# Patient Record
Sex: Female | Born: 1955
Health system: Southern US, Community
[De-identification: ages and names within clinical notes are randomized; demographics above are authoritative.]

## PROBLEM LIST (undated history)

## (undated) DIAGNOSIS — F329 Major depressive disorder, single episode, unspecified: Secondary | ICD-10-CM

## (undated) DIAGNOSIS — E785 Hyperlipidemia, unspecified: Secondary | ICD-10-CM

## (undated) DIAGNOSIS — D219 Benign neoplasm of connective and other soft tissue, unspecified: Secondary | ICD-10-CM

## (undated) DIAGNOSIS — I2510 Atherosclerotic heart disease of native coronary artery without angina pectoris: Secondary | ICD-10-CM

## (undated) DIAGNOSIS — K219 Gastro-esophageal reflux disease without esophagitis: Secondary | ICD-10-CM

## (undated) DIAGNOSIS — I509 Heart failure, unspecified: Secondary | ICD-10-CM

## (undated) DIAGNOSIS — M109 Gout, unspecified: Secondary | ICD-10-CM

## (undated) DIAGNOSIS — I10 Essential (primary) hypertension: Secondary | ICD-10-CM

## (undated) DIAGNOSIS — M79674 Pain in right toe(s): Secondary | ICD-10-CM

## (undated) DIAGNOSIS — R918 Other nonspecific abnormal finding of lung field: Secondary | ICD-10-CM

## (undated) DIAGNOSIS — F32A Depression, unspecified: Secondary | ICD-10-CM

## (undated) DIAGNOSIS — J449 Chronic obstructive pulmonary disease, unspecified: Secondary | ICD-10-CM

## (undated) DIAGNOSIS — M329 Systemic lupus erythematosus, unspecified: Secondary | ICD-10-CM

## (undated) DIAGNOSIS — Z9981 Dependence on supplemental oxygen: Secondary | ICD-10-CM

## (undated) DIAGNOSIS — I639 Cerebral infarction, unspecified: Secondary | ICD-10-CM

## (undated) HISTORY — DX: Major depressive disorder, single episode, unspecified: F32.9

## (undated) HISTORY — DX: Depression, unspecified: F32.A

## (undated) HISTORY — DX: Chronic obstructive pulmonary disease, unspecified: J44.9

## (undated) HISTORY — DX: Systemic lupus erythematosus, unspecified: M32.9

## (undated) HISTORY — PX: FRACTURE SURGERY: SHX138

## (undated) HISTORY — DX: Pain in right toe(s): M79.674

## (undated) HISTORY — DX: Gastro-esophageal reflux disease without esophagitis: K21.9

## (undated) HISTORY — DX: Benign neoplasm of connective and other soft tissue, unspecified: D21.9

## (undated) HISTORY — DX: Other nonspecific abnormal finding of lung field: R91.8

## (undated) HISTORY — PX: CORONARY ANGIOPLASTY WITH STENT PLACEMENT: SHX49

## (undated) HISTORY — DX: Hyperlipidemia, unspecified: E78.5

## (undated) HISTORY — DX: Atherosclerotic heart disease of native coronary artery without angina pectoris: I25.10

## (undated) HISTORY — PX: TUBAL LIGATION: SHX77

## (undated) HISTORY — DX: Essential (primary) hypertension: I10

---

## 2000-04-13 ENCOUNTER — Emergency Department (HOSPITAL_COMMUNITY): Admission: EM | Admit: 2000-04-13 | Discharge: 2000-04-13 | Payer: Self-pay | Admitting: Emergency Medicine

## 2002-09-04 ENCOUNTER — Encounter (HOSPITAL_COMMUNITY): Admission: RE | Admit: 2002-09-04 | Discharge: 2002-09-04 | Payer: Self-pay | Admitting: Family Medicine

## 2002-10-24 ENCOUNTER — Ambulatory Visit (HOSPITAL_COMMUNITY): Admission: RE | Admit: 2002-10-24 | Discharge: 2002-10-24 | Payer: Self-pay | Admitting: Family Medicine

## 2003-05-17 HISTORY — PX: VAGINAL HYSTERECTOMY: SUR661

## 2003-06-05 ENCOUNTER — Encounter (HOSPITAL_COMMUNITY): Admission: RE | Admit: 2003-06-05 | Discharge: 2003-09-03 | Payer: Self-pay | Admitting: Family Medicine

## 2003-06-14 ENCOUNTER — Encounter (INDEPENDENT_AMBULATORY_CARE_PROVIDER_SITE_OTHER): Payer: Self-pay

## 2003-06-14 ENCOUNTER — Inpatient Hospital Stay (HOSPITAL_COMMUNITY): Admission: RE | Admit: 2003-06-14 | Discharge: 2003-06-17 | Payer: Self-pay | Admitting: Obstetrics and Gynecology

## 2004-11-02 ENCOUNTER — Inpatient Hospital Stay (HOSPITAL_COMMUNITY): Admission: EM | Admit: 2004-11-02 | Discharge: 2004-11-06 | Payer: Self-pay | Admitting: Emergency Medicine

## 2004-11-02 ENCOUNTER — Ambulatory Visit: Payer: Self-pay | Admitting: Internal Medicine

## 2004-11-03 ENCOUNTER — Encounter (INDEPENDENT_AMBULATORY_CARE_PROVIDER_SITE_OTHER): Payer: Self-pay | Admitting: Cardiology

## 2004-11-21 ENCOUNTER — Ambulatory Visit: Payer: Self-pay | Admitting: Internal Medicine

## 2005-10-15 ENCOUNTER — Ambulatory Visit: Payer: Self-pay | Admitting: Internal Medicine

## 2005-10-29 ENCOUNTER — Ambulatory Visit: Payer: Self-pay | Admitting: Internal Medicine

## 2005-12-30 ENCOUNTER — Ambulatory Visit: Payer: Self-pay | Admitting: Internal Medicine

## 2006-01-21 ENCOUNTER — Emergency Department (HOSPITAL_COMMUNITY): Admission: EM | Admit: 2006-01-21 | Discharge: 2006-01-21 | Payer: Self-pay | Admitting: Emergency Medicine

## 2006-08-31 ENCOUNTER — Ambulatory Visit: Payer: Self-pay | Admitting: Internal Medicine

## 2006-12-24 DIAGNOSIS — F1910 Other psychoactive substance abuse, uncomplicated: Secondary | ICD-10-CM | POA: Insufficient documentation

## 2006-12-24 DIAGNOSIS — M797 Fibromyalgia: Secondary | ICD-10-CM

## 2006-12-24 DIAGNOSIS — K219 Gastro-esophageal reflux disease without esophagitis: Secondary | ICD-10-CM

## 2006-12-24 DIAGNOSIS — E785 Hyperlipidemia, unspecified: Secondary | ICD-10-CM

## 2006-12-24 DIAGNOSIS — I2510 Atherosclerotic heart disease of native coronary artery without angina pectoris: Secondary | ICD-10-CM

## 2006-12-24 DIAGNOSIS — Z9861 Coronary angioplasty status: Secondary | ICD-10-CM

## 2006-12-24 DIAGNOSIS — I10 Essential (primary) hypertension: Secondary | ICD-10-CM

## 2006-12-24 DIAGNOSIS — R768 Other specified abnormal immunological findings in serum: Secondary | ICD-10-CM

## 2006-12-27 ENCOUNTER — Ambulatory Visit: Payer: Self-pay | Admitting: Internal Medicine

## 2006-12-27 DIAGNOSIS — F329 Major depressive disorder, single episode, unspecified: Secondary | ICD-10-CM | POA: Insufficient documentation

## 2006-12-27 DIAGNOSIS — F419 Anxiety disorder, unspecified: Secondary | ICD-10-CM

## 2007-03-10 ENCOUNTER — Emergency Department (HOSPITAL_COMMUNITY): Admission: EM | Admit: 2007-03-10 | Discharge: 2007-03-10 | Payer: Self-pay | Admitting: Emergency Medicine

## 2007-08-04 ENCOUNTER — Emergency Department (HOSPITAL_COMMUNITY): Admission: EM | Admit: 2007-08-04 | Discharge: 2007-08-04 | Payer: Self-pay | Admitting: Emergency Medicine

## 2007-11-17 DIAGNOSIS — M329 Systemic lupus erythematosus, unspecified: Secondary | ICD-10-CM

## 2007-11-17 DIAGNOSIS — IMO0002 Reserved for concepts with insufficient information to code with codable children: Secondary | ICD-10-CM

## 2007-11-17 HISTORY — DX: Systemic lupus erythematosus, unspecified: M32.9

## 2007-11-17 HISTORY — DX: Reserved for concepts with insufficient information to code with codable children: IMO0002

## 2007-12-26 ENCOUNTER — Ambulatory Visit (HOSPITAL_COMMUNITY): Admission: RE | Admit: 2007-12-26 | Discharge: 2007-12-26 | Payer: Self-pay | Admitting: Internal Medicine

## 2007-12-26 ENCOUNTER — Ambulatory Visit: Payer: Self-pay | Admitting: Internal Medicine

## 2007-12-26 DIAGNOSIS — R799 Abnormal finding of blood chemistry, unspecified: Secondary | ICD-10-CM | POA: Insufficient documentation

## 2007-12-26 DIAGNOSIS — M79609 Pain in unspecified limb: Secondary | ICD-10-CM | POA: Insufficient documentation

## 2007-12-26 LAB — CONVERTED CEMR LAB
ALT: 28 units/L (ref 0–35)
AST: 26 units/L (ref 0–37)
Albumin: 4.6 g/dL (ref 3.5–5.2)
Alkaline Phosphatase: 111 units/L (ref 39–117)
Anti Nuclear Antibody(ANA): NEGATIVE
BUN: 11 mg/dL (ref 6–23)
Basophils Absolute: 0 10*3/uL (ref 0.0–0.1)
Basophils Relative: 0 % (ref 0–1)
CO2: 26 meq/L (ref 19–32)
Calcium: 9.9 mg/dL (ref 8.4–10.5)
Chloride: 102 meq/L (ref 96–112)
Cholesterol: 225 mg/dL — ABNORMAL HIGH (ref 0–200)
Creatinine, Ser: 0.86 mg/dL (ref 0.40–1.20)
Eosinophils Absolute: 0.1 10*3/uL (ref 0.0–0.7)
Eosinophils Relative: 1 % (ref 0–5)
Glucose, Bld: 91 mg/dL (ref 70–99)
HCT: 44.4 % (ref 36.0–46.0)
HDL: 84 mg/dL (ref >39)
Hemoglobin: 13.9 g/dL (ref 12.0–15.0)
LDL Cholesterol: 128 mg/dL — ABNORMAL HIGH (ref 0–99)
Lymphocytes Relative: 27 % (ref 12–46)
Lymphs Abs: 1.9 10*3/uL (ref 0.7–4.0)
MCHC: 31.3 g/dL (ref 30.0–36.0)
MCV: 89.3 fL (ref 78.0–100.0)
Monocytes Absolute: 0.5 10*3/uL (ref 0.1–1.0)
Monocytes Relative: 7 % (ref 3–12)
Neutro Abs: 4.4 10*3/uL (ref 1.7–7.7)
Neutrophils Relative %: 65 % (ref 43–77)
Platelets: 224 10*3/uL (ref 150–400)
Potassium: 3.8 meq/L (ref 3.5–5.3)
RBC: 4.97 M/uL (ref 3.87–5.11)
RDW: 15.6 % — ABNORMAL HIGH (ref 11.5–15.5)
Rheumatoid fact SerPl-aCnc: <20 intl units/mL (ref 0–20)
Sed Rate: 25 mm/hr — ABNORMAL HIGH (ref 0–22)
Sodium: 140 meq/L (ref 135–145)
Total Bilirubin: 0.6 mg/dL (ref 0.3–1.2)
Total CHOL/HDL Ratio: 2.7
Total Protein: 8 g/dL (ref 6.0–8.3)
Triglycerides: 63 mg/dL (ref <150)
Uric Acid, Serum: 5.4 mg/dL (ref 2.4–7.0)
VLDL: 13 mg/dL (ref 0–40)
WBC: 6.8 10*3/uL (ref 4.0–10.5)

## 2008-01-25 ENCOUNTER — Encounter: Payer: Self-pay | Admitting: Licensed Clinical Social Worker

## 2008-01-25 ENCOUNTER — Ambulatory Visit: Payer: Self-pay | Admitting: Internal Medicine

## 2008-05-14 ENCOUNTER — Encounter (INDEPENDENT_AMBULATORY_CARE_PROVIDER_SITE_OTHER): Payer: Self-pay | Admitting: *Deleted

## 2008-05-14 ENCOUNTER — Ambulatory Visit: Payer: Self-pay | Admitting: Internal Medicine

## 2008-05-14 DIAGNOSIS — M67919 Unspecified disorder of synovium and tendon, unspecified shoulder: Secondary | ICD-10-CM | POA: Insufficient documentation

## 2008-05-14 DIAGNOSIS — M719 Bursopathy, unspecified: Secondary | ICD-10-CM

## 2008-05-14 LAB — CONVERTED CEMR LAB
BUN: 16 mg/dL (ref 6–23)
CO2: 23 meq/L (ref 19–32)
Calcium: 9.6 mg/dL (ref 8.4–10.5)
Chloride: 104 meq/L (ref 96–112)
Creatinine, Ser: 0.81 mg/dL (ref 0.40–1.20)
Glucose, Bld: 93 mg/dL (ref 70–99)
Potassium: 4 meq/L (ref 3.5–5.3)
Sodium: 139 meq/L (ref 135–145)

## 2008-05-16 DIAGNOSIS — R918 Other nonspecific abnormal finding of lung field: Secondary | ICD-10-CM

## 2008-05-16 HISTORY — DX: Other nonspecific abnormal finding of lung field: R91.8

## 2008-06-14 ENCOUNTER — Observation Stay (HOSPITAL_COMMUNITY): Admission: EM | Admit: 2008-06-14 | Discharge: 2008-06-14 | Payer: Self-pay | Admitting: Emergency Medicine

## 2008-06-14 DIAGNOSIS — J984 Other disorders of lung: Secondary | ICD-10-CM

## 2008-10-22 ENCOUNTER — Encounter: Payer: Self-pay | Admitting: Internal Medicine

## 2008-10-22 ENCOUNTER — Encounter (INDEPENDENT_AMBULATORY_CARE_PROVIDER_SITE_OTHER): Payer: Self-pay | Admitting: *Deleted

## 2008-10-22 ENCOUNTER — Ambulatory Visit: Payer: Self-pay | Admitting: Internal Medicine

## 2008-10-22 DIAGNOSIS — G4700 Insomnia, unspecified: Secondary | ICD-10-CM | POA: Insufficient documentation

## 2008-10-22 LAB — CONVERTED CEMR LAB
ALT: 13 units/L (ref 0–35)
ANA Titer 1: 1:40 {titer} — ABNORMAL HIGH
AST: 15 units/L (ref 0–37)
Albumin: 4.3 g/dL (ref 3.5–5.2)
Alkaline Phosphatase: 115 units/L (ref 39–117)
Anti Nuclear Antibody(ANA): POSITIVE — AB
BUN: 14 mg/dL (ref 6–23)
CO2: 23 meq/L (ref 19–32)
Calcium: 9.3 mg/dL (ref 8.4–10.5)
Chloride: 106 meq/L (ref 96–112)
Creatinine, Ser: 0.89 mg/dL (ref 0.40–1.20)
Glucose, Bld: 94 mg/dL (ref 70–99)
Potassium: 4.2 meq/L (ref 3.5–5.3)
Sodium: 142 meq/L (ref 135–145)
Total Bilirubin: 0.4 mg/dL (ref 0.3–1.2)
Total Protein: 7.5 g/dL (ref 6.0–8.3)

## 2008-10-26 ENCOUNTER — Ambulatory Visit (HOSPITAL_COMMUNITY): Admission: RE | Admit: 2008-10-26 | Discharge: 2008-10-26 | Payer: Self-pay | Admitting: *Deleted

## 2009-03-07 ENCOUNTER — Ambulatory Visit: Payer: Self-pay | Admitting: Infectious Disease

## 2009-03-08 ENCOUNTER — Encounter: Payer: Self-pay | Admitting: Internal Medicine

## 2009-06-05 ENCOUNTER — Telehealth: Payer: Self-pay | Admitting: Internal Medicine

## 2009-06-11 ENCOUNTER — Ambulatory Visit: Payer: Self-pay | Admitting: Internal Medicine

## 2009-06-11 ENCOUNTER — Encounter: Payer: Self-pay | Admitting: Internal Medicine

## 2009-06-11 LAB — CONVERTED CEMR LAB
ALT: 26 units/L (ref 0–35)
ANA Titer 1: 1:80 {titer} — ABNORMAL HIGH
AST: 19 units/L (ref 0–37)
Albumin: 4.4 g/dL (ref 3.5–5.2)
Alkaline Phosphatase: 109 units/L (ref 39–117)
Anti Nuclear Antibody(ANA): POSITIVE — AB
BUN: 11 mg/dL (ref 6–23)
Basophils Absolute: 0 10*3/uL (ref 0.0–0.1)
Basophils Relative: 0 % (ref 0–1)
CO2: 27 meq/L (ref 19–32)
CRP: 0.4 mg/dL (ref <0.6)
Calcium: 9.8 mg/dL (ref 8.4–10.5)
Chloride: 104 meq/L (ref 96–112)
Cholesterol: 240 mg/dL — ABNORMAL HIGH (ref 0–200)
Creatinine, Ser: 0.99 mg/dL (ref 0.40–1.20)
Eosinophils Absolute: 0 10*3/uL (ref 0.0–0.7)
Eosinophils Relative: 1 % (ref 0–5)
Glucose, Bld: 87 mg/dL (ref 70–99)
HCT: 45 % (ref 36.0–46.0)
HDL: 59 mg/dL (ref >39)
Hemoglobin: 14.1 g/dL (ref 12.0–15.0)
LDL Cholesterol: 163 mg/dL — ABNORMAL HIGH (ref 0–99)
Lymphocytes Relative: 30 % (ref 12–46)
Lymphs Abs: 1.6 10*3/uL (ref 0.7–4.0)
MCHC: 31.3 g/dL (ref 30.0–36.0)
MCV: 89.1 fL (ref >=78.0)
Monocytes Absolute: 0.6 10*3/uL (ref 0.1–1.0)
Monocytes Relative: 10 % (ref 3–12)
Neutro Abs: 3.2 10*3/uL (ref 1.7–7.7)
Neutrophils Relative %: 59 % (ref 43–77)
Platelets: 218 10*3/uL (ref 150–400)
Potassium: 3.4 meq/L — ABNORMAL LOW (ref 3.5–5.3)
RBC: 5.05 M/uL (ref 3.87–5.11)
RDW: 15.4 % (ref 11.5–15.5)
Rheumatoid fact SerPl-aCnc: <20 intl units/mL (ref 0–20)
Sed Rate: 29 mm/hr — ABNORMAL HIGH (ref 0–22)
Sodium: 143 meq/L (ref 135–145)
TSH: 1.179 microintl units/mL (ref 0.350–4.5)
Total Bilirubin: 0.4 mg/dL (ref 0.3–1.2)
Total CHOL/HDL Ratio: 4.1
Total Protein: 7.3 g/dL (ref 6.0–8.3)
Triglycerides: 89 mg/dL (ref <150)
VLDL: 18 mg/dL (ref 0–40)
Vitamin B-12: 657 pg/mL (ref 211–911)
WBC: 5.5 10*3/uL (ref 4.0–10.5)

## 2009-06-20 ENCOUNTER — Telehealth: Payer: Self-pay | Admitting: Internal Medicine

## 2009-06-25 ENCOUNTER — Ambulatory Visit: Payer: Self-pay | Admitting: Internal Medicine

## 2009-07-29 ENCOUNTER — Telehealth: Payer: Self-pay | Admitting: Internal Medicine

## 2009-08-14 ENCOUNTER — Ambulatory Visit: Payer: Self-pay | Admitting: Internal Medicine

## 2009-08-14 DIAGNOSIS — M549 Dorsalgia, unspecified: Secondary | ICD-10-CM | POA: Insufficient documentation

## 2009-09-04 ENCOUNTER — Ambulatory Visit (HOSPITAL_COMMUNITY): Admission: RE | Admit: 2009-09-04 | Discharge: 2009-09-04 | Payer: Self-pay | Admitting: Internal Medicine

## 2009-09-04 LAB — HM MAMMOGRAPHY: HM Mammogram: NEGATIVE

## 2009-10-17 ENCOUNTER — Ambulatory Visit: Payer: Self-pay | Admitting: Internal Medicine

## 2009-10-18 LAB — CONVERTED CEMR LAB
ALT: 11 units/L (ref 0–35)
AST: 14 units/L (ref 0–37)
Albumin: 4.2 g/dL (ref 3.5–5.2)
Alkaline Phosphatase: 119 units/L — ABNORMAL HIGH (ref 39–117)
BUN: 18 mg/dL (ref 6–23)
CO2: 23 meq/L (ref 19–32)
Calcium: 9.4 mg/dL (ref 8.4–10.5)
Chloride: 107 meq/L (ref 96–112)
Cholesterol: 232 mg/dL — ABNORMAL HIGH (ref 0–200)
Creatinine, Ser: 0.85 mg/dL (ref 0.40–1.20)
Glucose, Bld: 94 mg/dL (ref 70–99)
HDL: 66 mg/dL (ref >39)
LDL Cholesterol: 155 mg/dL — ABNORMAL HIGH (ref 0–99)
Potassium: 4.2 meq/L (ref 3.5–5.3)
Sodium: 141 meq/L (ref 135–145)
Total Bilirubin: 0.4 mg/dL (ref 0.3–1.2)
Total CHOL/HDL Ratio: 3.5
Total Protein: 6.9 g/dL (ref 6.0–8.3)
Triglycerides: 55 mg/dL (ref <150)
VLDL: 11 mg/dL (ref 0–40)

## 2010-01-03 ENCOUNTER — Ambulatory Visit: Payer: Self-pay | Admitting: Internal Medicine

## 2010-01-13 ENCOUNTER — Ambulatory Visit: Payer: Self-pay | Admitting: Family Medicine

## 2010-01-13 DIAGNOSIS — M6530 Trigger finger, unspecified finger: Secondary | ICD-10-CM | POA: Insufficient documentation

## 2010-01-14 ENCOUNTER — Encounter: Payer: Self-pay | Admitting: Family Medicine

## 2010-02-14 ENCOUNTER — Telehealth: Payer: Self-pay | Admitting: Internal Medicine

## 2010-02-28 ENCOUNTER — Ambulatory Visit: Payer: Self-pay | Admitting: Internal Medicine

## 2010-03-04 LAB — CONVERTED CEMR LAB
Cholesterol: 250 mg/dL — ABNORMAL HIGH (ref 0–200)
HDL: 90 mg/dL (ref >39)
LDL Cholesterol: 149 mg/dL — ABNORMAL HIGH (ref 0–99)
Total CHOL/HDL Ratio: 2.8
Triglycerides: 55 mg/dL (ref <150)
VLDL: 11 mg/dL (ref 0–40)

## 2010-04-02 ENCOUNTER — Encounter: Payer: Self-pay | Admitting: Internal Medicine

## 2010-04-02 ENCOUNTER — Ambulatory Visit: Payer: Self-pay | Admitting: Infectious Disease

## 2010-04-02 ENCOUNTER — Telehealth (INDEPENDENT_AMBULATORY_CARE_PROVIDER_SITE_OTHER): Payer: Self-pay | Admitting: *Deleted

## 2010-04-02 ENCOUNTER — Ambulatory Visit (HOSPITAL_COMMUNITY): Admission: RE | Admit: 2010-04-02 | Discharge: 2010-04-02 | Payer: Self-pay | Admitting: Infectious Disease

## 2010-08-29 ENCOUNTER — Ambulatory Visit: Payer: Self-pay | Admitting: Internal Medicine

## 2010-08-29 ENCOUNTER — Encounter: Payer: Self-pay | Admitting: Internal Medicine

## 2010-08-29 DIAGNOSIS — G608 Other hereditary and idiopathic neuropathies: Secondary | ICD-10-CM

## 2010-08-29 DIAGNOSIS — G589 Mononeuropathy, unspecified: Secondary | ICD-10-CM | POA: Insufficient documentation

## 2010-08-29 DIAGNOSIS — L0292 Furuncle, unspecified: Secondary | ICD-10-CM | POA: Insufficient documentation

## 2010-08-29 DIAGNOSIS — L089 Local infection of the skin and subcutaneous tissue, unspecified: Secondary | ICD-10-CM | POA: Insufficient documentation

## 2010-08-29 DIAGNOSIS — L0293 Carbuncle, unspecified: Secondary | ICD-10-CM

## 2010-08-29 LAB — CONVERTED CEMR LAB
ALT: 16 units/L (ref 0–35)
AST: 17 units/L (ref 0–37)
Albumin: 4.6 g/dL (ref 3.5–5.2)
Alkaline Phosphatase: 117 units/L (ref 39–117)
BUN: 15 mg/dL (ref 6–23)
CO2: 27 meq/L (ref 19–32)
Calcium: 9.7 mg/dL (ref 8.4–10.5)
Chloride: 104 meq/L (ref 96–112)
Cholesterol: 235 mg/dL — ABNORMAL HIGH (ref 0–200)
Creatinine, Ser: 0.93 mg/dL (ref 0.40–1.20)
Glucose, Bld: 91 mg/dL (ref 70–99)
HCT: 42.4 % (ref 36.0–46.0)
HDL: 61 mg/dL (ref >39)
Hemoglobin: 13.5 g/dL (ref 12.0–15.0)
Hgb A1c MFr Bld: 5.5 %
LDL Cholesterol: 156 mg/dL — ABNORMAL HIGH (ref 0–99)
MCHC: 31.8 g/dL (ref 30.0–36.0)
MCV: 86.4 fL (ref >=78.0)
Platelets: 226 10*3/uL (ref 150–400)
Potassium: 4.3 meq/L (ref 3.5–5.3)
RBC: 4.91 M/uL (ref 3.87–5.11)
RDW: 15.5 % (ref 11.5–15.5)
Sodium: 140 meq/L (ref 135–145)
TSH: 1.208 microintl units/mL (ref 0.350–4.5)
Total Bilirubin: 0.6 mg/dL (ref 0.3–1.2)
Total CHOL/HDL Ratio: 3.9
Total Protein: 8 g/dL (ref 6.0–8.3)
Triglycerides: 91 mg/dL (ref <150)
VLDL: 18 mg/dL (ref 0–40)
Vitamin B-12: 842 pg/mL (ref 211–911)
WBC: 8.1 10*3/uL (ref 4.0–10.5)

## 2010-09-01 ENCOUNTER — Encounter: Payer: Self-pay | Admitting: Licensed Clinical Social Worker

## 2010-10-24 ENCOUNTER — Ambulatory Visit: Payer: Self-pay | Admitting: Family Medicine

## 2010-11-03 ENCOUNTER — Ambulatory Visit: Payer: Self-pay | Admitting: Family Medicine

## 2010-12-07 ENCOUNTER — Encounter: Payer: Self-pay | Admitting: Internal Medicine

## 2010-12-08 ENCOUNTER — Encounter: Payer: Self-pay | Admitting: Internal Medicine

## 2010-12-16 NOTE — Progress Notes (Signed)
Summary: phone/gg  Phone Note Call from Patient   Caller: Patient Summary of Call: Pt called stating last Saturday she had episode of  c/o chest pain and left arm felt funny, followed by  sick feeling, nausea, and hot feeling.   this happened again yesterday with pain lasting 15 minutes, denies SOB and diaphoresis. Pt has Hx of CAD and was cathed in past but she is not aware of results of cath Pt request appointment.  Pt advised to go to ED now for evaluation,  she refuses and was given appointment for 1400 today. I explained that the ED evaluation is what should be done and also told her if she has any return pain to call EMS Initial call taken by: Gevena Cotton RN,  Apr 02, 2010 8:47 AM  Follow-up for Phone Call        She needs to go to the ED. This IS NOT an appropriate place to be working up acute chest pain. She needs to go to the ED now. If  she insists on coming to clinic she is very likely going to get admitted. I have put orders for stat labs intot he computer. She needs a urine tox screen done as well she was cocaine positive when she had her cath in 2005 Follow-up by: Alcide Evener MD,  Apr 02, 2010 9:26 AM  New Problems: CHEST PAIN, ACUTE (ICD-786.50)   New Problems: CHEST PAIN, ACUTE (ICD-786.50)  Appended Document: phone/gg Pt called back and again advised of above.  She understands but is getting a ride at 1400 and wants to come in to clinic

## 2010-12-16 NOTE — Assessment & Plan Note (Signed)
Summary: routine/gg   Vital Signs:  Patient profile:   55 year old female Height:      62 inches (157.48 cm) Weight:      176.9 pounds (80.41 kg) BMI:     32.47 Temp:     97.6 degrees F (36.44 degrees C) oral Pulse rate:   68 / minute BP sitting:   146 / 103  (right arm) Cuff size:   regular  Vitals Entered By: Mateo Flow Deborra Medina) (August 29, 2010 9:53 AM) CC: bump on back on back of left leg, med refill on ambien, feet burning, Depression Is Patient Diabetic? No Pain Assessment Patient in pain? yes     Location: left leg Intensity: 10 Type: sharp Onset of pain  Sudden x 5 days Nutritional Status BMI of > 30 = obese  Have you ever been in a relationship where you felt threatened, hurt or afraid?No   Does patient need assistance? Functional Status Self care Ambulation Normal   Primary Care Provider:  Barton Dubois MD  CC:  bump on back on back of left leg, med refill on ambien, feet burning, and Depression.  History of Present Illness: 55 yo female with PMH significant for polysubstance abuse, depression, HLD, HTN, CAD (70% LAD stenosis) presents to [facility] with main concern of bail on the back of her left thigh that she first noticed this past Monday. It started as a small pimple and she tried to squeeze it and after that it only got worse, increased in size, became more tender and warm to tocuh and now it is about 2-3 inches in diameter. In the beginning she has noticed some discharge but nothing has been draining for the past 23- days. She does endores hisotry of chronic boils. She denies fever, chills, abdominal concerns, and no other systemic symptoms.  In additions, she has noticed that she has more burning in her feet and she is worried about the possibility of diabetes. She has family history of DM but she has never been diagnosed. She endorses nocturia and polydypsia but can not tell how long it has been there. Her feet burn most of the time and tends to gets  better with rest. She needs refill on ambien since her insomnia is only controlled with that medication but she does reports  she takes only when needed. She still uses cocaine and smokes and last times she used it was on Tuesday.  Her depression is well controlled on her meds she reports and finally feels like she is ready to quit and talk to someone about cessation.  She reports compliance with medications for cholesterol and BP. She is not sure about her LUPUS medicine and is not sure when was the last time she took it.   Depression History:      The patient is having a depressed mood most of the day but denies diminished interest in her usual daily activities.  Positive alarm features for depression include insomnia.  However, she denies significant weight loss, significant weight gain, hypersomnia, psychomotor agitation, psychomotor retardation, fatigue (loss of energy), feelings of worthlessness (guilt), impaired concentration (indecisiveness), and recurrent thoughts of death or suicide.        The patient denies that she feels like life is not worth living, denies that she wishes that she were dead, and denies that she has thought about ending her life.         Preventive Screening-Counseling & Management  Alcohol-Tobacco     Alcohol  drinks/day: 1- 40oz     Alcohol type: beer     Smoking Status: current     Smoking Cessation Counseling: yes     Packs/Day: 1 cig per day     Year Started: 1960     Year Quit: 2008  Problems Prior to Update: 1)  Coronary Artery Disease  (ICD-414.00) 2)  Chest Pain  (ICD-786.50) 3)  Uri  (ICD-465.9) 4)  Hyperlipidemia  (ICD-272.4) 5)  Hypertension  (ICD-401.9) 6)  Health Screening  (ICD-V70.0) 7)  Ana Positive  (ICD-790.99) 8)  Lupus  (ICD-710.0) 9)  Pulmonary Nodule  (ICD-518.89) 10)  Substance Abuse  (ICD-305.90) 11)  Depression  (ICD-311) 12)  Arm Pain, Left  (ICD-729.5) 13)  Smoker  (ICD-305.1) 14)  Insomnia-sleep Disorder-unspec   (ICD-780.52) 15)  Hand Pain, Bilateral  (ICD-729.5) 16)  Trigger Finger  (ICD-727.03) 17)  Back Pain  (ICD-724.5) 18)  Rotator Cuff Syndrome, Left  (ICD-726.10) 19)  Leg Pain, Chronic, Left  (ICD-729.5) 20)  Gerd  (ICD-530.81) 21)  Fibromyalgia  (ICD-729.1)  Medications Prior to Update: 1)  Aspirin 81 Mg Tbec (Aspirin) .... Take 1 Tablet By Mouth Once A Day 2)  Norvasc 10 Mg  Tabs (Amlodipine Besylate) .... Take 1 Tablet By Mouth Once A Day 3)  Ambien 10 Mg Tabs (Zolpidem Tartrate) .... Take 1 Pill By Mouth Daily. 4)  Plaquenil 200 Mg Tabs (Hydroxychloroquine Sulfate) .... Take 1 Tablet By Mouth Once A Day 5)  Amitriptyline Hcl 25 Mg Tabs (Amitriptyline Hcl) .... Take 1 Tab By Mouth At Bedtime 6)  Bupropion Hcl 100 Mg Tabs (Bupropion Hcl) .... Take 1 Tablet By Mouth Once A Day For 1 Week, Then Start Taking 1 Tab By Mouth Two Times A Day  Current Medications (verified): 1)  Aspirin 81 Mg Tbec (Aspirin) .... Take 1 Tablet By Mouth Once A Day 2)  Norvasc 10 Mg  Tabs (Amlodipine Besylate) .... Take 1 Tablet By Mouth Once A Day 3)  Ambien 10 Mg Tabs (Zolpidem Tartrate) .... Take 1 Pill By Mouth Daily. 4)  Plaquenil 200 Mg Tabs (Hydroxychloroquine Sulfate) .... Take 1 Tablet By Mouth Once A Day 5)  Amitriptyline Hcl 25 Mg Tabs (Amitriptyline Hcl) .... Take 1 Tab By Mouth At Bedtime 6)  Bupropion Hcl 100 Mg Tabs (Bupropion Hcl) .... Take 1 Tablet By Mouth Once A Day For 1 Week, Then Start Taking 1 Tab By Mouth Two Times A Day  Allergies (verified): No Known Drug Allergies  Past History:  Past Medical History: Last updated: 04/02/2010 LUPUS, per pt Fibromyalgia, per pt Coronary artery disease, 70-80% stenosis of small 2nd diagonal but no other significant stenotic lesions MAJOR DEPRESSION GERD HYPERLIPIDEMIA HYPERTENSION Substance abuse: Etoh, cocaine  Past Surgical History: Last updated: 12/27/2006 Hysterectomy, 05/2003 for fibroids  Family History: Last updated: 05/14/2008 No  family hx of colon CA Brother died of prostate CA at 68. Sister died from cervical CA at 53. Mother died from an MI in her 2.  Social History: Last updated: 10/22/2008 Occupation: disabled. Divorced. Has a 54 y/o dgtr that lives in another state.  Risk Factors: Alcohol Use: 1- 40oz (08/29/2010) Exercise: no (04/02/2010)  Risk Factors: Smoking Status: current (08/29/2010) Packs/Day: 1 cig per day (08/29/2010)  Family History: Reviewed history from 05/14/2008 and no changes required. No family hx of colon CA Brother died of prostate CA at 91. Sister died from cervical CA at 12. Mother died from an MI in her 22.  Social History: Reviewed history from 10/22/2008  and no changes required. Occupation: disabled. Divorced. Has a 54 y/o dgtr that lives in another state.  Review of Systems       per HPI  Physical Exam  General:  Well-developed,well-nourished,in no acute distress; alert,appropriate and cooperative throughout examination Neck:  supple, no thyromegaly, no JVD, and no carotid bruits.   Lungs:  normal respiratory effort, no accessory muscle use, normal breath sounds, no crackles, and no wheezes.   Heart:  normal rate, regular rhythm, no murmur, no gallop, no rub, and no JVD.   Abdomen:  soft, non-tender, normal bowel sounds, and no distention.  tenderness over right lower ribs anteriorly Extremities:  No clubbing, cyanosis, edema, or deformity noted with normal full range of motion of all joints.   Neurologic:  alert & oriented X3, cranial nerves II-XII intact, strength normal in all extremities, sensation intact to light touch, sensation intact to pinprick, and gait normal.   Skin:  turgor normal, color normal, no rashes, no ecchymoses, no petechiae, no purpura, and no edema, 2.5 inch round swelling with erythema and central pimple like lesion, area is tender to palpation and warm to touch, no drainage noted, no blood Psych:  anxious and restless, oriented  X3, memory intact for recent and remote, and good eye contact.  Oriented X3, memory intact for recent and remote, good eye contact, not agitated, not suicidal, not homicidal, and tearful.     Impression & Recommendations:  Problem # 1:  UNSPEC LOCAL INFECTION SKIN&SUBCUTANEOUS TISSUE (ICD-686.9) Unclear etiology of the skin erythema, does not appear to be consistant with boil, or cellulitis, pt denies spider or other bug bites but if anything it looks mostly like a spider bite. Looking at the lupus skin manifestations, we were not able to find any correlation there. I have discussed the case with Dr. Lynnae January and we have agreed to start with symptomatic management with warm compressors and I have advised her to come back to clinic if her symptoms get worse and she experiences any systemic symptoms.  Problem # 2:  HYPERLIPIDEMIA (EXH-371.4) Will check FLP today, and according to our medication records pt is not taking any lipid lowering medicaiton, will wait for the results and initiate treatment as indicated.  Orders: T-Comprehensive Metabolic Panel ([id number]) T-Lipid Profile [phone])  Labs Reviewed: SGOT: 14 (10/17/2009)   SGPT: 11 (10/17/2009)   HDL:90 (02/28/2010), 66 (10/17/2009)  LDL:149 (02/28/2010), 155 (10/17/2009)  Chol:250 (02/28/2010), 232 (10/17/2009)  Trig:55 (02/28/2010), 55 (10/17/2009)  Problem # 3:  HYPERTENSION (ICD-401.9) Most likely secondary to noncompliance, I have discussed the issue in detail with the patient and she has agreed to pay more attention to this and she is ready to focus on her lifestyle changes as well.  Her updated medication list for this problem includes:    Norvasc 10 Mg Tabs (Amlodipine besylate) .Marland Kitchen... Take 1 tablet by mouth once a day  Orders: T-Comprehensive Metabolic Panel ([id number])  BP today: 146/103 Prior BP: 140/94 (04/02/2010)  Labs Reviewed: K+: 4.2 (10/17/2009) Creat: : 0.85 (10/17/2009)   Chol: 250 (02/28/2010)   HDL: 90  (02/28/2010)   LDL: 149 (02/28/2010)   TG: 55 (02/28/2010)  Problem # 4:  DEPRESSION (ICD-311) Well controlled on the medications, she still has symptoms of insomnia but that is better controlled when she is taking ambien. Will also refill the two meds below.  Her updated medication list for this problem includes:    Amitriptyline Hcl 25 Mg Tabs (Amitriptyline hcl) .Marland Kitchen... Take 1 tab by mouth at bedtime  Bupropion Hcl 100 Mg Tabs (Bupropion hcl) .Marland Kitchen... Take 1 tablet by mouth once a day for 1 week, then start taking 1 tab by mouth two times a day  Problem # 5:  INSOMNIA-SLEEP DISORDER-UNSPEC (ICD-780.52) Well controlled while taking the medicine. WIll cont the same regimen.  Her updated medication list for this problem includes:    Ambien 10 Mg Tabs (Zolpidem tartrate) .Marland Kitchen... Take 1 pill by mouth daily.  Problem # 6:  NEUROPATHY (ICD-355.9) Unclear etiology, possible vit B12 defficiency, thyroid etiology, infectious such as HIV and ? lupus vs diabetes. Will check following labs and will cont amytryptiline for now which is the med the pt was on before and that may help but if does not we can swithc to neurontin and will also follow up on lab tests.  Orders: T- Hemoglobin A1C ([id number]) T-TSH ([id number]) T-Vitamin B12 ([id number])  Complete Medication List: 1)  Aspirin 81 Mg Tbec (Aspirin) .... Take 1 tablet by mouth once a day 2)  Norvasc 10 Mg Tabs (Amlodipine besylate) .... Take 1 tablet by mouth once a day 3)  Ambien 10 Mg Tabs (Zolpidem tartrate) .... Take 1 pill by mouth daily. 4)  Plaquenil 200 Mg Tabs (Hydroxychloroquine sulfate) .... Take 1 tablet by mouth once a day 5)  Amitriptyline Hcl 25 Mg Tabs (Amitriptyline hcl) .... Take 1 tab by mouth at bedtime 6)  Bupropion Hcl 100 Mg Tabs (Bupropion hcl) .... Take 1 tablet by mouth once a day for 1 week, then start taking 1 tab by mouth two times a day  Other Orders: T-HIV Antibody  (Reflex) ([id number]) T-CBC No Diff  ([id number]) Social Work Referral (Social )  Patient Instructions: 1)  Please schedule a follow-up appointment in 2 weeks. 2)  Apply warm compresses to the skin and come back in 1-2 weeks if your symptoms get worse. 3)  Tobacco is very bad for your health and your loved ones! You Should stop smoking!. 4)  Stop Smoking Tips: Choose a Quit date. Cut down before the Quit date. decide what you will do as a substitute when you feel the urge to smoke(gum,toothpick,exercise). 5)  Please check your blood pressure regularly, if it is >170 please call clinic at [phone] 6)  Will call you if any test results return abnormal. Prescriptions: AMBIEN 10 MG TABS (ZOLPIDEM TARTRATE) take 1 pill by mouth daily.  #30 x 3   Entered and Authorized by:   Trinidad Curet MD   Signed by:   Trinidad Curet MD on 08/29/2010   Method used:   Print then Give to Patient   RxID:   [id number] BUPROPION HCL 100 MG TABS (BUPROPION HCL) Take 1 tablet by mouth once a day for 1 week, then start taking 1 tab by mouth two times a day  #62 x 3   Entered and Authorized by:   Trinidad Curet MD   Signed by:   Trinidad Curet MD on 08/29/2010   Method used:   Electronically to        [facility] (retail)       [place], [place]  [address]       Ph: [phone]       Fax: [phone]   RxID:   [id number] AMITRIPTYLINE HCL 25 MG TABS (AMITRIPTYLINE HCL) Take 1 tab by mouth at bedtime  #31 x 3   Entered and Authorized by:  Trinidad Curet MD   Signed by:   Trinidad Curet MD on 08/29/2010   Method used:   Electronically to        [facility] (retail)       [address] [place], [place]  [address]       Ph: [phone]       Fax: [phone]   RxID:   [id number] PLAQUENIL 200 MG TABS (HYDROXYCHLOROQUINE SULFATE) Take 1 tablet by mouth once a day  #31 x 3   Entered and Authorized by:   Trinidad Curet MD   Signed by:   Trinidad Curet MD on 08/29/2010   Method used:   Electronically to        [facility] (retail)       [address]       [place], [place]  [address]       Ph: [phone]       Fax: [phone]   RxID:   [id number] NORVASC 10 MG  TABS (AMLODIPINE BESYLATE) Take 1 tablet by mouth once a day  #31 Tablet x 4   Entered and Authorized by:   Trinidad Curet MD   Signed by:   Trinidad Curet MD on 08/29/2010   Method used:   Electronically to        [facility] (retail)       [place], [place]  [address]       Ph: [phone]       Fax: [phone]   RxID:   [id number] ASPIRIN 81 MG TBEC (ASPIRIN) Take 1 tablet by mouth once a day  #30 x 3   Entered and Authorized by:   Trinidad Curet MD   Signed by:   Trinidad Curet MD on 08/29/2010   Method used:   Electronically to        [facility] (retail)       [address]       [place], [place]  [address]       Ph: [phone]       Fax: [phone]   [place]:   [id number]  Process Orders Check Orders Results:     [facility]: Order checked:     [id number] -- T-HIV Antibody  (Reflex) -- ABN required due to diagnosis (CPT: 09628)     36629 -- T-Vitamin B12 -- ABN required due to diagnosis (CPT: 47654)     65035 -- T- Hemoglobin A1C -- ABN required due to diagnosis (CPT: 46568) Order queued for requisitioning for [facility]: August 29, 2010 11:51 AM  Tests Sent for requisitioning (August 29, 2010 11:51 AM):     08/29/2010: [facility] -- T-Comprehensive Metabolic Panel [[id number]] (signed)     08/29/2010: [facility] -- T-Lipid Profile [phone] (signed)     08/29/2010: [facility] -- T-HIV Antibody  (Reflex) [[id number]] (signed)     08/29/2010: [facility] -- T- Hemoglobin A1C [[id number]] (signed)     08/29/2010: [facility] -- T-CBC No Diff [[id number]] (signed)     08/29/2010: [facility] -- T-TSH [[id number]] (signed)     08/29/2010: [facility] -- T-Vitamin B12 [phone] (signed)     Prevention & Chronic Care Immunizations   Influenza vaccine: Fluvax 3+  (08/14/2009)   Influenza vaccine deferral: Not indicated  (02/28/2010)   Influenza vaccine due: 07/17/2009    Tetanus booster: Not documented   Td booster deferral: Not indicated  (02/28/2010)    Pneumococcal vaccine: Not documented   Pneumococcal vaccine deferral: Deferred  (06/11/2009)  Colorectal Screening   Hemoccult: Not documented   Hemoccult action/deferral: Not indicated  (02/28/2010)    Colonoscopy: Not documented   Colonoscopy action/deferral: Refused  (08/29/2010)  Other Screening   Pap smear: Not documented   Pap smear action/deferral: Not indicated S/P hysterectomy  (08/14/2009)    Mammogram: ASSESSMENT: Negative - BI-RADS 1^MM DIGITAL SCREENING  (09/04/2009)   Mammogram action/deferral: Ordered  (08/14/2009)   Smoking status: current  (08/29/2010)   Smoking cessation counseling: yes  (08/29/2010)  Lipids   Total Cholesterol: 250  (02/28/2010)   Lipid panel action/deferral: Lipid Panel ordered   LDL: 149  (02/28/2010)   LDL Direct: Not documented   HDL: 90  (02/28/2010)   Triglycerides: 55  (02/28/2010)   Lipid panel due: 08/29/2010    SGOT (AST): 14  (10/17/2009)   SGPT (ALT): 11  (10/17/2009) CMP ordered    Alkaline phosphatase: 119  (10/17/2009)   Total bilirubin: 0.4  (10/17/2009)    Lipid flowsheet reviewed?: Yes   Progress toward LDL goal: Deteriorated  Hypertension   Last Blood Pressure: 146 / 103  (08/29/2010)   Serum creatinine: 0.85  (10/17/2009)   Serum potassium 4.2  (10/17/2009) CMP ordered     Hypertension flowsheet reviewed?: Yes   Progress toward BP goal: Deteriorated  Self-Management Support :   Personal Goals (by the next clinic visit) :       Personal blood pressure goal: 140/90  (10/17/2009)     Personal LDL goal: 70  (10/17/2009)    Patient will work on the following items until the next clinic visit to reach self-care goals:     Medications and monitoring: take my medicines every day  (08/29/2010)     Eating: eat baked foods instead of fried foods  (08/29/2010)     Activity: take a 30 minute walk every day  (02/28/2010)    Hypertension self-management support: Written self-care plan, Education handout, Resources for patients handout  (02/28/2010)    Lipid self-management support: Written self-care plan, Education handout, Resources for patients handout  (02/28/2010)    Nursing Instructions: Give Flu vaccine today    Laboratory Results   Blood Tests   Date/Time Received: August 29, 2010 11:37 AM  Date/Time Reported: Lenoria Farrier  August 29, 2010 11:37 AM   HGBA1C: 5.5%   (Normal Range: Non-Diabetic - 3-6%   Control Diabetic - 6-8%)

## 2010-12-16 NOTE — Letter (Signed)
Summary: *Consult Note  [facility]  [address]   [place], [place] [address]   Phone: [phone]  Fax: [phone]    Re:    Elizabeth [name] DOB:    07/25/1956   Dear:    Lujean Rave you for requesting that we see the above patient for consultation.  A copy of the detailed office note will be sent under separate cover, for your review.  Evaluation today is consistent with: B trigger fingers of long finger.   Our recommendation is for: B steroid injections today.      After today's visit, the patients current medications include: 1)  ASPIRIN 81 MG TBEC (ASPIRIN) Take 1 tablet by mouth once a day 2)  NORVASC 10 MG  TABS (AMLODIPINE BESYLATE) Take 1 tablet by mouth once a day 3)  PRILOSEC 20 MG CPDR (OMEPRAZOLE) take 1 pill by mouth daily. 4)  AMBIEN 10 MG TABS (ZOLPIDEM TARTRATE) take 1 pill by mouth daily. 5)  PREDNISONE 5 MG TABS (PREDNISONE) take 4 tabs by mouth for 3 days; then 3 tabs by mouth for 3 days;  then 2 tabs by mouth for 3 days and then 1 tabs for 3 days. 6)  PLAQUENIL 200 MG TABS (HYDROXYCHLOROQUINE SULFATE) Take 1 tablet by mouth once a day 7)  AMITRIPTYLINE HCL 25 MG TABS (AMITRIPTYLINE HCL) Take 1 tab by mouth at bedtime 8)  BUPROPION HCL 100 MG TABS (BUPROPION HCL) Take 1 tablet by mouth once a day for 1 week, then start taking 1 tab by mouth two times a day   Thank you for this consultation.  If you have any further questions regarding the care of this patient, please do not hesitate to contact me @   Thank you for this opportunity to look after your patient.  Sincerely,   Dorcas Mcmurray MD  Appended Document: *Consult Note    Clinical Lists Changes  Orders: Added new Referral order of Orthopedic Referral (Ortho) - Signed     PT INFORMED

## 2010-12-16 NOTE — Miscellaneous (Signed)
  Patient has apptmt on Nov.  1st at 2:30.  I will change my hours from 11:30 to 3:30 on that day to meet with her.    Appended Document:  Patient changed appmt to today but still a no show.  No other appmts scheduled.  I will send a letter home to have her contact social work.

## 2010-12-16 NOTE — Assessment & Plan Note (Signed)
Summary: ACUTE-LUPUS PROBLEMS([facility])/CFB   Vital Signs:  Patient profile:   55 year old female Height:      62 inches (157.48 cm) Weight:      172.9 pounds (78.59 kg) BMI:     31.74 Temp:     97.7 degrees F (36.50 degrees C) oral Pulse rate:   71 / minute BP sitting:   124 / 85  (right arm)  Vitals Entered By: Hilda Blades Ditzler RN (January 03, 2010 1:59 PM) Is Patient Diabetic? No Pain Assessment Patient in pain? yes     Location: hands and legs Intensity: 5 Type: hurts Onset of pain  months Nutritional Status BMI of > 30 = obese Nutritional Status Detail appetite good  Have you ever been in a relationship where you felt threatened, hurt or afraid?denies   Does patient need assistance? Functional Status Self care Ambulation Normal Comments Tired all the time for past year. Discuss hands and legs. Not sleeping.   Primary Care Provider:  Barton Dubois MD   History of Present Illness: 55 y/o female with pmh as outlined in the EMR; who comes to the clinic for followup of her HTN, Lupus, Depression and also pain in her hands. Patient was seen last time and she was complaining of pain in her hands, inttermitent swelling and also triggered fingers. She received naproxen for pain and sports medicine referral; naproxen is not helping and she never follow with sports medicine.  She is also asking for refill on her plaquenil which was helping her a lot to control symptoms of her lupus; she has not been evaluated by a rheumathologist yet.  She is compliant with low sodium diet and antihypertensive medication; but reports feeling sick with the use of pravachol and stop taking it.  Patient denies SI or hallucinations, but she reports feeling really tired, w/o energy, depressed on/off and also experiencing lack of interest in her daily activities and hobbies.  Depression History:      The patient denies a depressed mood most of the day and a diminished interest in her usual daily  activities.  Positive alarm features for depression include insomnia, fatigue (loss of energy), and impaired concentration (indecisiveness).        The patient denies that she feels like life is not worth living, denies that she wishes that she were dead, and denies that she has thought about ending her life.         Preventive Screening-Counseling & Management  Alcohol-Tobacco     Alcohol drinks/day: 1- 40oz     Alcohol type: beer     Smoking Status: current     Smoking Cessation Counseling: yes     Packs/Day: 4-5 cigs/day     Year Started: 1959     Year Quit: 2008  Problems Prior to Update: 1)  Trigger Finger  (ICD-727.03) 2)  Uri  (ICD-465.9) 3)  Back Pain  (ICD-724.5) 4)  Hand Pain, Bilateral  (ICD-729.5) 5)  Health Screening  (ICD-V70.0) 6)  Insomnia-sleep Disorder-unspec  (ICD-780.52) 7)  Pulmonary Nodule  (ICD-518.89) 8)  Rotator Cuff Syndrome, Left  (ICD-726.10) 9)  Ana Positive  (ICD-790.99) 10)  Leg Pain, Chronic, Left  (ICD-729.5) 11)  Arm Pain, Left  (ICD-729.5) 12)  Smoker  (ICD-305.1) 13)  Substance Abuse  (ICD-305.90) 14)  Depression  (ICD-311) 15)  Hypertension  (ICD-401.9) 16)  Fibromyalgia  (ICD-729.1) 17)  Lupus  (ICD-710.0) 18)  Hyperlipidemia  (ICD-272.4) 19)  Gerd  (ICD-530.81) 20)  Coronary Artery Disease  (ICD-414.00)  Current Problems (verified): 1)  Uri  (ICD-465.9) 2)  Back Pain  (ICD-724.5) 3)  Hand Pain, Bilateral  (ICD-729.5) 4)  Health Screening  (ICD-V70.0) 5)  Insomnia-sleep Disorder-unspec  (ICD-780.52) 6)  Pulmonary Nodule  (ICD-518.89) 7)  Rotator Cuff Syndrome, Left  (ICD-726.10) 8)  Ana Positive  (ICD-790.99) 9)  Leg Pain, Chronic, Left  (ICD-729.5) 10)  Arm Pain, Left  (ICD-729.5) 11)  Smoker  (ICD-305.1) 12)  Substance Abuse  (ICD-305.90) 13)  Depression  (ICD-311) 14)  Hypertension  (ICD-401.9) 15)  Fibromyalgia  (ICD-729.1) 16)  Lupus  (ICD-710.0) 17)  Hyperlipidemia  (ICD-272.4) 18)  Gerd  (ICD-530.81) 19)  Coronary  Artery Disease  (ICD-414.00)  Medications Prior to Update: 1)  Aspirin 81 Mg Tbec (Aspirin) .... Take 1 Tablet By Mouth Once A Day 2)  Norvasc 10 Mg  Tabs (Amlodipine Besylate) .... Take 1 Tablet By Mouth Once A Day 3)  Pravachol 40 Mg Tabs (Pravastatin Sodium) .... Take 1 Tablet By Mouth Once A Day 4)  Naprosyn 250 Mg Tabs (Naproxen) .... Take 1 Pill By Mouth Three Times A Day As Needed For Hand and Leg Pain. 5)  Prilosec 20 Mg Cpdr (Omeprazole) .... Take 1 Pill By Mouth Daily. 6)  Ambien 10 Mg Tabs (Zolpidem Tartrate) .... Take 1 Pill By Mouth Daily.  Current Medications (verified): 1)  Aspirin 81 Mg Tbec (Aspirin) .... Take 1 Tablet By Mouth Once A Day 2)  Norvasc 10 Mg  Tabs (Amlodipine Besylate) .... Take 1 Tablet By Mouth Once A Day 3)  Prilosec 20 Mg Cpdr (Omeprazole) .... Take 1 Pill By Mouth Daily. 4)  Ambien 10 Mg Tabs (Zolpidem Tartrate) .... Take 1 Pill By Mouth Daily.  Allergies (verified): No Known Drug Allergies  Past History:  Past Medical History: Last updated: 10/22/2008 LUPUS, per pt Fibromyalgia, per pt Coronary artery disease, 70-80% stenosis of small 2nd diagonal MAJOR DEPRESSION GERD HYPERLIPIDEMIA HYPERTENSION Substance abuse: Etoh, cocaine  Past Surgical History: Last updated: 12/27/2006 Hysterectomy, 05/2003 for fibroids  Family History: Last updated: 05/14/2008 No family hx of colon CA Brother died of prostate CA at 73. Sister died from cervical CA at 1. Mother died from an MI in her 48.  Social History: Last updated: 10/22/2008 Occupation: disabled. Divorced. Has a 54 y/o dgtr that lives in another state.  Risk Factors: Alcohol Use: 1- 40oz (01/03/2010)  Risk Factors: Smoking Status: current (01/03/2010) Packs/Day: 4-5 cigs/day (01/03/2010)  Review of Systems  The patient denies anorexia, fever, chest pain, syncope, dyspnea on exertion, headaches, hemoptysis, melena, hematochezia, incontinence, transient blindness, abnormal  bleeding, and enlarged lymph nodes.    Physical Exam  General:  alert, well-developed, and well-hydrated.   Lungs:  normal breath sounds, no crackles, and no wheezes.   Heart:  normal rate, regular rhythm, no murmur, and no gallop.   Abdomen:  soft, non-tender, normal bowel sounds, and no distention.   Msk:  Left Hand: There is some tenderness on the MCP joint of the 3rd finger as well as mild swelling. No redness or increased temperature. When asked to extend the middle finger it stucks at the PIP. No tenderness along the long extensor/flexor tendons of the fingers.   Findings of the right hand is same except the tenderness is not so pronounced.  Extremities:  no edema Neurologic:  alert & oriented X3, cranial nerves II-XII intact, gait normal, and DTRs symmetrical and normal.     Impression & Recommendations:  Problem # 1:  HAND PAIN, BILATERAL (ICD-729.5)  Patient with a lot of pain and triggered finguer bilaterally. Patient pain, swelling and problems most likely associated with her lupus as well.  Will give short term treatment with prednisone, will start plaquenil, tylenol for pain control and will make referral to sports medicine.  Orders: Sports Medicine (Sports Med)  Problem # 2:  INSOMNIA-SLEEP DISORDER-UNSPEC (ICD-780.52) Patient with hx of insomnia; most likely also associated to depression. will treat her depressiona nd will give prescription for ambien as needed  to fall sleep. This medication has helped her in the past.  Her updated medication list for this problem includes:    Ambien 10 Mg Tabs (Zolpidem tartrate) .Marland Kitchen... Take 1 pill by mouth daily.  Problem # 3:  HYPERTENSION (ICD-401.9) well controlled. will continue therapy with amlodipine daily and will encourage her to follow a low sodium diet.  Her updated medication list for this problem includes:    Norvasc 10 Mg Tabs (Amlodipine besylate) .Marland Kitchen... Take 1 tablet by mouth once a day  BP today: 124/85 Prior BP:  151/99 (10/17/2009)  Labs Reviewed: K+: 4.2 (10/17/2009) Creat: : 0.85 (10/17/2009)   Chol: 232 (10/17/2009)   HDL: 66 (10/17/2009)   LDL: 155 (10/17/2009)   TG: 55 (10/17/2009)  Problem # 4:  LUPUS (ICD-710.0) Will restart plaquenil dialy and will also give a short term therapy with prednisone. Will follow rehumatology recommendations. Patient advised to be compiant with medications.  The following medications were removed from the medication list:    Naprosyn 250 Mg Tabs (Naproxen) .Marland Kitchen... Take 1 pill by mouth three times a day as needed for hand and leg pain. Her updated medication list for this problem includes:    Aspirin 81 Mg Tbec (Aspirin) .Marland Kitchen... Take 1 tablet by mouth once a day    Prednisone 5 Mg Tabs (Prednisone) .Marland Kitchen... Take 4 tabs by mouth for 3 days; then 3 tabs by mouth for 3 days;  then 2 tabs by mouth for 3 days and then 1 tabs for 3 days.    Plaquenil 200 Mg Tabs (Hydroxychloroquine sulfate) .Marland Kitchen... Take 1 tablet by mouth once a day  Problem # 5:  HYPERLIPIDEMIA (ICD-272.4) LDL 155 in Dec and she has not take her statins. will encourage her to use her statins as prescribed and will performed lipid profile and LFT's check during next visit.  The following medications were removed from the medication list:    Pravachol 40 Mg Tabs (Pravastatin sodium) .Marland Kitchen... Take 1 tablet by mouth once a day  Labs Reviewed: SGOT: 14 (10/17/2009)   SGPT: 11 (10/17/2009)   HDL:66 (10/17/2009), 59 (06/11/2009)  LDL:155 (10/17/2009), 163 (06/11/2009)  Chol:232 (10/17/2009), 240 (06/11/2009)  Trig:55 (10/17/2009), 89 (06/11/2009)  Problem # 6:  DEPRESSION (ICD-311) Patient with mild to moderate symptoms of depression. will start her on amitriptylene (which would also help her for fibromyalgia). will also start bupropion 161m daily for 1 week and then increased to two times a day. will follow her symptoms in 2 months.   Her updated medication list for this problem includes:    Amitriptyline Hcl 25 Mg  Tabs (Amitriptyline hcl) ..Marland Kitchen.. Take 1 tab by mouth at bedtime    Bupropion Hcl 100 Mg Tabs (Bupropion hcl) ..Marland Kitchen.. Take 1 tablet by mouth once a day for 1 week, then start taking 1 tab by mouth two times a day  Complete Medication List: 1)  Aspirin 81 Mg Tbec (Aspirin) .... Take 1 tablet by mouth once a day 2)  Norvasc 10 Mg Tabs (Amlodipine besylate) ..Marland KitchenMarland KitchenMarland Kitchen  Take 1 tablet by mouth once a day 3)  Prilosec 20 Mg Cpdr (Omeprazole) .... Take 1 pill by mouth daily. 4)  Ambien 10 Mg Tabs (Zolpidem tartrate) .... Take 1 pill by mouth daily. 5)  Prednisone 5 Mg Tabs (Prednisone) .... Take 4 tabs by mouth for 3 days; then 3 tabs by mouth for 3 days;  then 2 tabs by mouth for 3 days and then 1 tabs for 3 days. 6)  Plaquenil 200 Mg Tabs (Hydroxychloroquine sulfate) .... Take 1 tablet by mouth once a day 7)  Amitriptyline Hcl 25 Mg Tabs (Amitriptyline hcl) .... Take 1 tab by mouth at bedtime 8)  Bupropion Hcl 100 Mg Tabs (Bupropion hcl) .... Take 1 tablet by mouth once a day for 1 week, then start taking 1 tab by mouth two times a day  Patient Instructions: 1)  Please schedule a follow-up appointment in 2 month with me. 2)  Limit your Sodium (Salt) to less than 2 grams a day. 3)  Tobacco is very bad for your health and your loved ones! You Should stop smoking!. 4)  Stop Smoking Tips: Choose a Quit date. Cut down before the Quit date. decide what you will do as a substitute when you feel the urge to smoke(gum,toothpick,exercise). 5)  It is important that you exercise regularly at least 20 minutes 5 times a week. 6)  Take your medications as prescribed. Prescriptions: BUPROPION HCL 100 MG TABS (BUPROPION HCL) Take 1 tablet by mouth once a day for 1 week, then start taking 1 tab by mouth two times a day  #62 x 3   Entered and Authorized by:   Barton Dubois MD   Signed by:   Barton Dubois MD on 01/03/2010   Method used:   Electronically to        [facility] (retail)       [address]       [place], [place]  [address]       Ph: [phone]       Fax: [phone]   RxID:   [id number] AMITRIPTYLINE HCL 25 MG TABS (AMITRIPTYLINE HCL) Take 1 tab by mouth at bedtime  #31 x 3   Entered and Authorized by:   Barton Dubois MD   Signed by:   Barton Dubois MD on 01/03/2010   Method used:   Electronically to        [facility] (retail)       [place], [place]  [address]       Ph: [phone]       Fax: [phone]   RxID:   [id number] PLAQUENIL 200 MG TABS (HYDROXYCHLOROQUINE SULFATE) Take 1 tablet by mouth once a day  #31 x 3   Entered and Authorized by:   Barton Dubois MD   Signed by:   Barton Dubois MD on 01/03/2010   Method used:   Electronically to        [facility] (retail)       [place], [place]  [address]       Ph: [phone]       Fax: [phone]   RxID:   [id number] PREDNISONE 5 MG TABS (PREDNISONE)  take 4 tabs by mouth for 3 days; then 3 tabs by mouth for 3 days;  then 2 tabs by mouth for 3 days and then 1 tabs for 3 days.  #30 x 0   Entered and Authorized by:   Barton Dubois MD   Signed by:   Barton Dubois MD on 01/03/2010   Method used:   Electronically to        [facility] (retail)       [address] [place], [place]  [address]       Ph: [phone]       Fax: [phone]   RxID:   [id number]    Prevention & Chronic Care Immunizations   Influenza vaccine: Fluvax 3+  (08/14/2009)   Influenza vaccine deferral: Deferred  (06/11/2009)   Influenza vaccine due: 07/17/2009    Tetanus booster: Not documented   Td booster deferral: Deferred  (06/11/2009)    Pneumococcal vaccine: Not documented   Pneumococcal vaccine deferral: Deferred  (06/11/2009)  Colorectal Screening   Hemoccult: Not documented   Hemoccult action/deferral: Deferred  (06/11/2009)     Colonoscopy: Not documented   Colonoscopy action/deferral: GI referral  (06/11/2009)  Other Screening   Pap smear: Not documented   Pap smear action/deferral: Not indicated S/P hysterectomy  (08/14/2009)    Mammogram: ASSESSMENT: Negative - BI-RADS 1^MM DIGITAL SCREENING  (09/04/2009)   Mammogram action/deferral: Ordered  (08/14/2009)   Smoking status: current  (01/03/2010)   Smoking cessation counseling: yes  (01/03/2010)  Lipids   Total Cholesterol: 232  (10/17/2009)   Lipid panel action/deferral: Lipid Panel ordered   LDL: 155  (10/17/2009)   LDL Direct: Not documented   HDL: 66  (10/17/2009)   Triglycerides: 55  (10/17/2009)    SGOT (AST): 14  (10/17/2009)   SGPT (ALT): 11  (10/17/2009)   Alkaline phosphatase: 119  (10/17/2009)   Total bilirubin: 0.4  (10/17/2009)  Hypertension   Last Blood Pressure: 124 / 85  (01/03/2010)   Serum creatinine: 0.85  (10/17/2009)   Serum potassium 4.2  (10/17/2009)  Self-Management Support :   Personal Goals (by the next clinic visit) :      Personal blood pressure goal: 140/90  (10/17/2009)     Personal LDL goal: 70  (10/17/2009)    Patient will work on the following items until the next clinic visit to reach self-care goals:     Medications and monitoring: take my medicines every day, bring all of my medications to every visit  (01/03/2010)     Eating: drink diet soda or water instead of juice or soda, eat more vegetables, use fresh or frozen vegetables, eat fruit for snacks and desserts  (01/03/2010)     Activity: take a 30 minute walk every day, park at the far end of the parking lot  (01/03/2010)    Hypertension self-management support: Written self-care plan  (10/17/2009)    Lipid self-management support: Written self-care plan  (10/17/2009)

## 2010-12-16 NOTE — Assessment & Plan Note (Signed)
Summary: chest pain/gg   Vital Signs:  Patient profile:   55 year old female Height:      62 inches (157.48 cm) Weight:      178.4 pounds (81.09 kg) BMI:     32.75 Temp:     97.6 degrees F (36.44 degrees C) oral Pulse rate:   66 / minute BP sitting:   140 / 94  (left arm)  Vitals Entered By: Morrison Old RN (Apr 02, 2010 2:16 PM) CC: Chest pain started Saturday; left arm feels "funny". c/o nausea/headache yesterday. Feels better  at this time. Als c/o feet burning. Is Patient Diabetic? No Nutritional Status BMI of > 30 = obese  Have you ever been in a relationship where you felt threatened, hurt or afraid?No   Does patient need assistance? Functional Status Self care Ambulation Normal   Primary Care Provider:  Barton Dubois MD  CC:  Chest pain started Saturday; left arm feels "funny". c/o nausea/headache yesterday. Feels better  at this time. Als c/o feet burning.Marland Kitchen  History of Present Illness: Elizabeth [name] is a 55 yo woman with PMH as outlined in chart.  She is here for chest pain that started 4-5 days ago.  waxes and wanes.  substerna-left sided in location.  sharp in nature, 7-8/10 at its worse.  lasts about 10-15 minutes and resolves spontaneously.  no aggrevating/alleviating factors.  radiates to left arm.  associated with nausea and headache.  reports prior episode with LHC about 4-5 years ago done here.  walks about 1 block daily and reports pain with walking, especially at faster paces.  2 episodes at rest (saturday and yesterday).   Depression History:      The patient denies a depressed mood most of the day and a diminished interest in her usual daily activities.         Preventive Screening-Counseling & Management  Alcohol-Tobacco     Alcohol drinks/day: 1- 40oz     Alcohol type: beer     Smoking Status: current     Smoking Cessation Counseling: yes     Packs/Day: 1 cig per day     Year Started: 1959     Year Quit: 2008  Caffeine-Diet-Exercise     Does Patient  Exercise: no  Current Medications (verified): 1)  Aspirin 81 Mg Tbec (Aspirin) .... Take 1 Tablet By Mouth Once A Day 2)  Norvasc 10 Mg  Tabs (Amlodipine Besylate) .... Take 1 Tablet By Mouth Once A Day 3)  Ambien 10 Mg Tabs (Zolpidem Tartrate) .... Take 1 Pill By Mouth Daily. 4)  Plaquenil 200 Mg Tabs (Hydroxychloroquine Sulfate) .... Take 1 Tablet By Mouth Once A Day 5)  Amitriptyline Hcl 25 Mg Tabs (Amitriptyline Hcl) .... Take 1 Tab By Mouth At Bedtime 6)  Bupropion Hcl 100 Mg Tabs (Bupropion Hcl) .... Take 1 Tablet By Mouth Once A Day For 1 Week, Then Start Taking 1 Tab By Mouth Two Times A Day  Allergies (verified): No Known Drug Allergies  Past History:  Past Surgical History: Last updated: 12/27/2006 Hysterectomy, 05/2003 for fibroids  Family History: Last updated: 05/14/2008 No family hx of colon CA Brother died of prostate CA at 39. Sister died from cervical CA at 66. Mother died from an MI in her 49.  Social History: Last updated: 10/22/2008 Occupation: disabled. Divorced. Has a 54 y/o dgtr that lives in another state.  Risk Factors: Alcohol Use: 1- 40oz (04/02/2010) Exercise: no (04/02/2010)  Risk Factors: Smoking Status: current (04/02/2010) Packs/Day:  1 cig per day (04/02/2010)  Past Medical History: LUPUS, per pt Fibromyalgia, per pt Coronary artery disease, 70-80% stenosis of small 2nd diagonal but no other significant stenotic lesions MAJOR DEPRESSION GERD HYPERLIPIDEMIA HYPERTENSION Substance abuse: Etoh, cocaine  Social History: Does Patient Exercise:  no  Review of Systems      See HPI  Physical Exam  General:  alert, cooperative to examination, and overweight-appearing.   Head:  normocephalic and atraumatic.   Eyes:  vision grossly intact, pupils equal, pupils round, and pupils reactive to light.  anicteric Neck:  supple, no thyromegaly, no JVD, and no carotid bruits.   Lungs:  normal respiratory effort, no accessory muscle use,  normal breath sounds, no crackles, and no wheezes.   Heart:  normal rate, regular rhythm, no murmur, no gallop, no rub, and no JVD.   Abdomen:  soft, non-tender, normal bowel sounds, and no distention.  tenderness over right lower ribs anteriorly Msk:  tenderness over right anterior lower ribs Extremities:  no edema Neurologic:  alert & oriented X3, cranial nerves II-XII intact, strength normal in all extremities, and gait normal.   Psych:  markedly anxious and restless   Impression & Recommendations:  Problem # 1:  CHEST PAIN (ICD-786.50) overall atypical, but history may raise concern for angina currently chest pain free prior cath 2005 only significant for 70-80% lesion of small 2nd diagonal branch of LAD ECG without ischemic changes will await labs ordered, presume cocaine positive based on physical exam since pain on and off since saturday, if cardiac enzymes negative, will refer to cardiology for stress test.   ECG without ischemia, all labs negative except for cocaine positive UDS.  Advised on cessation, offerred SW consult refused) and will send home.  Discussed options including further work up with pt, but agreed that there is not much benefit of further testing with ongoing use of cocaine.    > 40 minutes face to face  Complete Medication List: 1)  Aspirin 81 Mg Tbec (Aspirin) .... Take 1 tablet by mouth once a day 2)  Norvasc 10 Mg Tabs (Amlodipine besylate) .... Take 1 tablet by mouth once a day 3)  Ambien 10 Mg Tabs (Zolpidem tartrate) .... Take 1 pill by mouth daily. 4)  Plaquenil 200 Mg Tabs (Hydroxychloroquine sulfate) .... Take 1 tablet by mouth once a day 5)  Amitriptyline Hcl 25 Mg Tabs (Amitriptyline hcl) .... Take 1 tab by mouth at bedtime 6)  Bupropion Hcl 100 Mg Tabs (Bupropion hcl) .... Take 1 tablet by mouth once a day for 1 week, then start taking 1 tab by mouth two times a day  Patient Instructions: 1)  Please schedule an appointment with your primary  doctor when available. 2)  You must stop using cocaine as this is your main risk of having a heart attack. 3)  As we discussed, we can set you up with Katy Fitch for help with resources to help you stop. 4)  Continue your medications below and call us if there are any other problems.   Prevention & Chronic Care Immunizations   Influenza vaccine: Fluvax 3+  (08/14/2009)   Influenza vaccine deferral: Not indicated  (02/28/2010)   Influenza vaccine due: 07/17/2009    Tetanus booster: Not documented   Td booster deferral: Not indicated  (02/28/2010)    Pneumococcal vaccine: Not documented   Pneumococcal vaccine deferral: Deferred  (06/11/2009)  Colorectal Screening   Hemoccult: Not documented   Hemoccult action/deferral: Not indicated  (02/28/2010)  Colonoscopy: Not documented   Colonoscopy action/deferral: GI referral  (06/11/2009)  Other Screening   Pap smear: Not documented   Pap smear action/deferral: Not indicated S/P hysterectomy  (08/14/2009)    Mammogram: ASSESSMENT: Negative - BI-RADS 1^MM DIGITAL SCREENING  (09/04/2009)   Mammogram action/deferral: Ordered  (08/14/2009)   Smoking status: current  (04/02/2010)   Smoking cessation counseling: yes  (04/02/2010)  Lipids   Total Cholesterol: 250  (02/28/2010)   Lipid panel action/deferral: Lipid Panel ordered   LDL: 149  (02/28/2010)   LDL Direct: Not documented   HDL: 90  (02/28/2010)   Triglycerides: 55  (02/28/2010)    SGOT (AST): 14  (10/17/2009)   SGPT (ALT): 11  (10/17/2009)   Alkaline phosphatase: 119  (10/17/2009)   Total bilirubin: 0.4  (10/17/2009)  Hypertension   Last Blood Pressure: 140 / 94  (04/02/2010)   Serum creatinine: 0.85  (10/17/2009)   Serum potassium 4.2  (10/17/2009)  Self-Management Support :   Personal Goals (by the next clinic visit) :      Personal blood pressure goal: 140/90  (10/17/2009)     Personal LDL goal: 70  (10/17/2009)    Hypertension self-management support:  Written self-care plan, Education handout, Resources for patients handout  (02/28/2010)    Lipid self-management support: Written self-care plan, Education handout, Resources for patients handout  (02/28/2010)

## 2010-12-16 NOTE — Miscellaneous (Signed)
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

## 2010-12-16 NOTE — Assessment & Plan Note (Signed)
Summary: acute   Vital Signs:  Patient profile:   55 year old female Height:      62 inches (157.48 cm) Weight:      172.1 pounds (78.23 kg) BMI:     31.59 Temp:     97.5 degrees F (36.39 degrees C) oral Pulse rate:   65 / minute BP sitting:   123 / 83  (right arm)  Vitals Entered By: Hilda Blades Ditzler RN (February 28, 2010 11:23 AM) Is Patient Diabetic? No Pain Assessment Patient in pain? no      Nutritional Status BMI of > 30 = obese Nutritional Status Detail appetite good  Have you ever been in a relationship where you felt threatened, hurt or afraid?denies   Does patient need assistance? Functional Status Self care Ambulation Normal Comments Out of meds x 3 months - started again 02/27/10. Both legs hurts atnight along with head pain x 3 months. Inc gas recently.   Primary Care Provider:  Barton Dubois MD   History of Present Illness: 55 yo female with PMH outlined below presents today with main concern of being tired all the time with lack of energy and bilateral thigh pain. This has been chronic and she thinks it is related to her lupus. She denies any recent sicknesses or hospitalizations, no episodes of chest pain, fevers, chills, no abdominal or urinary concerns. She has no specific weakness just feels tired all the time, no syncopal episodes, no bumps or lumps noted anywhere on her body. Denies depression, no SI/HI, denies impaired concentration. Also has no problems with heat or cold intolerance.   Depression History:      The patient denies a depressed mood most of the day and a diminished interest in her usual daily activities.  Positive alarm features for depression include insomnia and fatigue (loss of energy).  However, she denies significant weight loss, significant weight gain, hypersomnia, psychomotor agitation, psychomotor retardation, feelings of worthlessness (guilt), impaired concentration (indecisiveness), and recurrent thoughts of death or suicide.        The  patient denies that she feels like life is not worth living, denies that she wishes that she were dead, and denies that she has thought about ending her life.         Preventive Screening-Counseling & Management  Alcohol-Tobacco     Alcohol drinks/day: 1- 40oz     Alcohol type: beer     Smoking Status: current     Smoking Cessation Counseling: yes     Packs/Day: 1 cig per day     Year Started: 1960     Year Quit: 2008  Problems Prior to Update: 1)  Trigger Finger  (ICD-727.03) 2)  Uri  (ICD-465.9) 3)  Back Pain  (ICD-724.5) 4)  Hand Pain, Bilateral  (ICD-729.5) 5)  Health Screening  (ICD-V70.0) 6)  Insomnia-sleep Disorder-unspec  (ICD-780.52) 7)  Pulmonary Nodule  (ICD-518.89) 8)  Rotator Cuff Syndrome, Left  (ICD-726.10) 9)  Ana Positive  (ICD-790.99) 10)  Leg Pain, Chronic, Left  (ICD-729.5) 11)  Arm Pain, Left  (ICD-729.5) 12)  Smoker  (ICD-305.1) 13)  Substance Abuse  (ICD-305.90) 14)  Depression  (ICD-311) 15)  Hypertension  (ICD-401.9) 16)  Fibromyalgia  (ICD-729.1) 17)  Lupus  (ICD-710.0) 18)  Hyperlipidemia  (ICD-272.4) 19)  Gerd  (ICD-530.81) 20)  Coronary Artery Disease  (ICD-414.00)  Medications Prior to Update: 1)  Aspirin 81 Mg Tbec (Aspirin) .... Take 1 Tablet By Mouth Once A Day 2)  Norvasc 10 Mg  Tabs (Amlodipine Besylate) .... Take 1 Tablet By Mouth Once A Day 3)  Prilosec 20 Mg Cpdr (Omeprazole) .... Take 1 Pill By Mouth Daily. 4)  Ambien 10 Mg Tabs (Zolpidem Tartrate) .... Take 1 Pill By Mouth Daily. 5)  Plaquenil 200 Mg Tabs (Hydroxychloroquine Sulfate) .... Take 1 Tablet By Mouth Once A Day 6)  Amitriptyline Hcl 25 Mg Tabs (Amitriptyline Hcl) .... Take 1 Tab By Mouth At Bedtime 7)  Bupropion Hcl 100 Mg Tabs (Bupropion Hcl) .... Take 1 Tablet By Mouth Once A Day For 1 Week, Then Start Taking 1 Tab By Mouth Two Times A Day  Current Medications (verified): 1)  Aspirin 81 Mg Tbec (Aspirin) .... Take 1 Tablet By Mouth Once A Day 2)  Norvasc 10 Mg  Tabs  (Amlodipine Besylate) .... Take 1 Tablet By Mouth Once A Day 3)  Prilosec 20 Mg Cpdr (Omeprazole) .... Take 1 Pill By Mouth Daily. 4)  Ambien 10 Mg Tabs (Zolpidem Tartrate) .... Take 1 Pill By Mouth Daily. 5)  Plaquenil 200 Mg Tabs (Hydroxychloroquine Sulfate) .... Take 1 Tablet By Mouth Once A Day 6)  Amitriptyline Hcl 25 Mg Tabs (Amitriptyline Hcl) .... Take 1 Tab By Mouth At Bedtime 7)  Bupropion Hcl 100 Mg Tabs (Bupropion Hcl) .... Take 1 Tablet By Mouth Once A Day For 1 Week, Then Start Taking 1 Tab By Mouth Two Times A Day  Allergies (verified): No Known Drug Allergies  Past History:  Past Medical History: Last updated: 10/22/2008 LUPUS, per pt Fibromyalgia, per pt Coronary artery disease, 70-80% stenosis of small 2nd diagonal MAJOR DEPRESSION GERD HYPERLIPIDEMIA HYPERTENSION Substance abuse: Etoh, cocaine  Past Surgical History: Last updated: 12/27/2006 Hysterectomy, 05/2003 for fibroids  Family History: Last updated: 05/14/2008 No family hx of colon CA Brother died of prostate CA at 6. Sister died from cervical CA at 62. Mother died from an MI in her 75.  Social History: Last updated: 10/22/2008 Occupation: disabled. Divorced. Has a 54 y/o dgtr that lives in another state.  Risk Factors: Alcohol Use: 1- 40oz (02/28/2010)  Risk Factors: Smoking Status: current (02/28/2010) Packs/Day: 1 cig per day (02/28/2010)  Family History: Reviewed history from 05/14/2008 and no changes required. No family hx of colon CA Brother died of prostate CA at 72. Sister died from cervical CA at 39. Mother died from an MI in her 6.  Social History: Reviewed history from 10/22/2008 and no changes required. Occupation: disabled. Divorced. Has a 54 y/o dgtr that lives in another state. Packs/Day:  1 cig per day  Review of Systems       per HPI  Physical Exam  General:  Well-developed,well-nourished,in no acute distress; alert,appropriate and cooperative  throughout examination Lungs:  Normal respiratory effort, chest expands symmetrically. Lungs are clear to auscultation, no crackles or wheezes. Heart:  Normal rate and regular rhythm. S1 and S2 normal without gallop, murmur, click, rub or other extra sounds. Abdomen:  Bowel sounds positive,abdomen soft and non-tender without masses, organomegaly or hernias noted. Msk:  No deformity or scoliosis noted of thoracic or lumbar spine.   Neurologic:  No cranial nerve deficits noted. Station and gait are normal. Plantar reflexes are down-going bilaterally. DTRs are symmetrical throughout. Sensory, motor and coordinative functions appear intact. Psych:  Cognition and judgment appear intact. Alert and cooperative with normal attention span and concentration. No apparent delusions, illusions, hallucinations    Impression & Recommendations:  Problem # 1:  INSOMNIA-SLEEP DISORDER-UNSPEC (ICD-780.52) Well controlled on ambien, will continue the same  regimen.  Her updated medication list for this problem includes:    Ambien 10 Mg Tabs (Zolpidem tartrate) .Marland Kitchen... Take 1 pill by mouth daily.  Problem # 2:  HYPERTENSION (ICD-401.9) Well controlled, will continue the same regimen.  Her updated medication list for this problem includes:    Norvasc 10 Mg Tabs (Amlodipine besylate) .Marland Kitchen... Take 1 tablet by mouth once a day  BP today: 123/83 Prior BP: 180/97 (01/13/2010)  Labs Reviewed: K+: 4.2 (10/17/2009) Creat: : 0.85 (10/17/2009)   Chol: 232 (10/17/2009)   HDL: 66 (10/17/2009)   LDL: 155 (10/17/2009)   TG: 55 (10/17/2009)  Problem # 3:  SMOKER (ICD-305.1)  Encouraged smoking cessation and discussed different methods for smoking cessation.   Problem # 4:  HYPERLIPIDEMIA (VCB-449.4)  Will check FLP today and will initiate treatment if indicated.  Labs Reviewed: SGOT: 14 (10/17/2009)   SGPT: 11 (10/17/2009)   HDL:66 (10/17/2009), 59 (06/11/2009)  LDL:155 (10/17/2009), 163 (06/11/2009)  Chol:232  (10/17/2009), 240 (06/11/2009)  Trig:55 (10/17/2009), 89 (06/11/2009)  Orders: T-Lipid Profile ([id number])  Problem # 5:  LUPUS (ICD-710.0) Patient thinks her tiredness and bilateral leg pain are seconday to her lupus and she is onadequate regimen for it. This does not appear to be lupus flare per my physical exam but could be rather contributed to "fibro fog". There is really no good treatment for this except physical activity whichis what I have recommended. I will check TSH today to rule out some other organic problems such as hypothyroidism. I will give her low dose percocet only 30 tablets and I have expalined to her that this is not chronic treatment just temporary and should not be cont long term.  Her updated medication list for this problem includes:    Aspirin 81 Mg Tbec (Aspirin) .Marland Kitchen... Take 1 tablet by mouth once a day    Plaquenil 200 Mg Tabs (Hydroxychloroquine sulfate) .Marland Kitchen... Take 1 tablet by mouth once a day  Complete Medication List: 1)  Aspirin 81 Mg Tbec (Aspirin) .... Take 1 tablet by mouth once a day 2)  Norvasc 10 Mg Tabs (Amlodipine besylate) .... Take 1 tablet by mouth once a day 3)  Prilosec 20 Mg Cpdr (Omeprazole) .... Take 1 pill by mouth daily. 4)  Ambien 10 Mg Tabs (Zolpidem tartrate) .... Take 1 pill by mouth daily. 5)  Plaquenil 200 Mg Tabs (Hydroxychloroquine sulfate) .... Take 1 tablet by mouth once a day 6)  Amitriptyline Hcl 25 Mg Tabs (Amitriptyline hcl) .... Take 1 tab by mouth at bedtime 7)  Bupropion Hcl 100 Mg Tabs (Bupropion hcl) .... Take 1 tablet by mouth once a day for 1 week, then start taking 1 tab by mouth two times a day 8)  Percocet 5-325 Mg Tabs (Oxycodone-acetaminophen) .... Take 1 tablet 1-2 times per day as needed for pain  Patient Instructions: 1)  Please schedule a follow-up appointment in 6 months. 2)  Please check your blood pressure regularly, if it is >170 please call clinic at [phone] 3)  Cigarette smoking is estimated to be  responsible for over five million premature deaths worldwide, making it the leading preventable cause of death. The most important causes of smoking-related mortality include atherosclerotic cardiovascular disease, lung cancer, and chronic obstructive pulmonary disease (COPD).  4)  Stop Smoking Tips: Choose a Quit date. Cut down before the Quit date. decide what you will do as a substitute when you feel the urge to smoke(gum,toothpick,exercise). 5)  It is important that you exercise regularly at least  20 minutes 5 times a week. If you develop chest pain, have severe difficulty breathing, or feel very tired , stop exercising immediately and seek medical attention. Prescriptions: PERCOCET 5-325 MG TABS (OXYCODONE-ACETAMINOPHEN) take 1 tablet 1-2 times per day as needed for pain  #30 x 0   Entered and Authorized by:   Trinidad Curet MD   Signed by:   Trinidad Curet MD on 02/28/2010   Method used:   Print then Give to Patient   RxID:   [id number]  Process Orders Check Orders Results:     [facility]: Check successful Order queued for requisitioning for [facility]: February 28, 2010 12:04 PM  Tests Sent for requisitioning (February 28, 2010 12:04 PM):     02/28/2010: [facility] -- T-Lipid Profile [phone] (signed)    Prevention & Chronic Care Immunizations   Influenza vaccine: Fluvax 3+  (08/14/2009)   Influenza vaccine deferral: Not indicated  (02/28/2010)   Influenza vaccine due: 07/17/2009    Tetanus booster: Not documented   Td booster deferral: Not indicated  (02/28/2010)    Pneumococcal vaccine: Not documented   Pneumococcal vaccine deferral: Deferred  (06/11/2009)  Colorectal Screening   Hemoccult: Not documented   Hemoccult action/deferral: Not indicated  (02/28/2010)    Colonoscopy: Not documented   Colonoscopy action/deferral: GI referral  (06/11/2009)  Other Screening   Pap smear: Not documented   Pap smear action/deferral: Not indicated  S/P hysterectomy  (08/14/2009)    Mammogram: ASSESSMENT: Negative - BI-RADS 1^MM DIGITAL SCREENING  (09/04/2009)   Mammogram action/deferral: Ordered  (08/14/2009)   Smoking status: current  (02/28/2010)   Smoking cessation counseling: yes  (02/28/2010)  Lipids   Total Cholesterol: 232  (10/17/2009)   Lipid panel action/deferral: Lipid Panel ordered   LDL: 155  (10/17/2009)   LDL Direct: Not documented   HDL: 66  (10/17/2009)   Triglycerides: 55  (10/17/2009)    SGOT (AST): 14  (10/17/2009)   SGPT (ALT): 11  (10/17/2009)   Alkaline phosphatase: 119  (10/17/2009)   Total bilirubin: 0.4  (10/17/2009)    Lipid flowsheet reviewed?: Yes   Progress toward LDL goal: Improved  Hypertension   Last Blood Pressure: 123 / 83  (02/28/2010)   Serum creatinine: 0.85  (10/17/2009)   Serum potassium 4.2  (10/17/2009)    Hypertension flowsheet reviewed?: Yes   Progress toward BP goal: At goal  Self-Management Support :   Personal Goals (by the next clinic visit) :      Personal blood pressure goal: 140/90  (10/17/2009)     Personal LDL goal: 70  (10/17/2009)    Patient will work on the following items until the next clinic visit to reach self-care goals:     Medications and monitoring: take my medicines every day  (02/28/2010)     Eating: drink diet soda or water instead of juice or soda, eat more vegetables, use fresh or frozen vegetables, eat fruit for snacks and desserts  (02/28/2010)     Activity: take a 30 minute walk every day  (02/28/2010)    Hypertension self-management support: Written self-care plan, Education handout, Resources for patients handout  (02/28/2010)   Hypertension self-care plan printed.   Hypertension education handout printed    Lipid self-management support: Written self-care plan, Education handout, Resources for patients handout  (02/28/2010)   Lipid self-care plan printed.   Lipid education handout printed      Resource handout printed.

## 2010-12-16 NOTE — Progress Notes (Signed)
Summary: Refill/gh  Phone Note Refill Request Message from:  Pharmacy on February 14, 2010 11:38 AM  Refills Requested: Medication #1:  AMBIEN 10 MG TABS take 1 pill by mouth daily.   Last Refilled: 01/04/2010  Method Requested: Electronic Initial call taken by: Sander Nephew RN,  February 14, 2010 11:38 AM Call placed by: Sander Nephew RN,  February 14, 2010 11:38 AM  Follow-up for Phone Call        Refill approved-nurse to complete    Prescriptions: AMBIEN 10 MG TABS (ZOLPIDEM TARTRATE) take 1 pill by mouth daily.  #30 x 1   Entered and Authorized by:   Barton Dubois MD   Signed by:   Barton Dubois MD on 02/14/2010   Method used:   Telephoned to ...       [facility] [id number] (retail)       [place], [place]  [address]       Ph: [phone]       Fax: [phone]   RxID:   [id number]   Appended Document: Refill/gh Above Rx called to [facility].

## 2010-12-16 NOTE — Assessment & Plan Note (Signed)
Summary: HAND PAIN BILATERAL,MC   Vital Signs:  Patient profile:   55 year old female BP sitting:   180 / 97  Primary Care Provider:  Barton Dubois MD   History of Present Illness: Pain and sticking of B long fingers wit painful nodules in her hand.has been going on a "while" but worse in last 3 months and that is when she noted the sticking issue whenshe flexes her fingers. No hand injury. No prior hand surgery. Never had an injection in this area before.  PMH sig for lupus, on plaquenil and prednisone.  Allergies: No Known Drug Allergies  Past History:  Past Medical History: Last updated: 10/22/2008 LUPUS, per pt Fibromyalgia, per pt Coronary artery disease, 70-80% stenosis of small 2nd diagonal MAJOR DEPRESSION GERD HYPERLIPIDEMIA HYPERTENSION Substance abuse: Etoh, cocaine  Past Surgical History: Last updated: 12/27/2006 Hysterectomy, 05/2003 for fibroids 1` Review of Systems  The patient denies fever, weight loss, and weight gain.    Physical Exam  Msk:  Has full extension of B long fingers but has to manually "unstick" it as it remains in flexion some of the time. Neurovascularly intact to soft touch in B hands. Normal grip strength. Hard nodule noted on flexor tendon of each long finger in the pal.  Additional Exam:  Patient given informed consent for injection. Discussed possible complications of infection, bleeding or skin atrophy at site of injection. Possible side effect of avascular necrosis (focal area of bone death) due to steroid use.Appropriate verbal time out taken Are cleaned and prepped in usual sterile fashion. A ---1/2- cc [id number]  plus -1/2---cc 1% lidocaine without epinephrine was injected into the--nodule on the flexor tendon of each long finger.-. Patient tolerated procedure well with no complications.    Impression & Recommendations:  Problem # 1:  TRIGGER FINGER (ICD-727.03)  Orders: Orthopedic Surgeon Referral (Ortho Surgeon) Joint  Aspirate / Injection, Small (20600) Kenalog 10 mg inj (Q7622) we discussed long term treatment--this is her forst injection. She would like to see how it goes and will f/u if no relief. If only brief relief (as in < a few months) I would consider a second injection but also refer for eval for surgery. her nodules are fairly large and with her PMH of probable lupus I think her chance of success w conservative therapy only is lower than average.  Complete Medication List: 1)  Aspirin 81 Mg Tbec (Aspirin) .... Take 1 tablet by mouth once a day 2)  Norvasc 10 Mg Tabs (Amlodipine besylate) .... Take 1 tablet by mouth once a day 3)  Prilosec 20 Mg Cpdr (Omeprazole) .... Take 1 pill by mouth daily. 4)  Ambien 10 Mg Tabs (Zolpidem tartrate) .... Take 1 pill by mouth daily. 5)  Prednisone 5 Mg Tabs (Prednisone) .... Take 4 tabs by mouth for 3 days; then 3 tabs by mouth for 3 days;  then 2 tabs by mouth for 3 days and then 1 tabs for 3 days. 6)  Plaquenil 200 Mg Tabs (Hydroxychloroquine sulfate) .... Take 1 tablet by mouth once a day 7)  Amitriptyline Hcl 25 Mg Tabs (Amitriptyline hcl) .... Take 1 tab by mouth at bedtime 8)  Bupropion Hcl 100 Mg Tabs (Bupropion hcl) .... Take 1 tablet by mouth once a day for 1 week, then start taking 1 tab by mouth two times a day

## 2010-12-18 NOTE — Assessment & Plan Note (Signed)
Summary: Elizabeth [name]   Vital Signs:  Patient profile:   55 year old female BP sitting:   130 / 38  Vitals Entered By: Caesar Chestnut RN (November 03, 2010 1:43 PM)   Primary Care Provider:  Barton Dubois MD   History of Present Illness: return of her trigger finger problems--both hands. I gave her a steroid injection many months ago and that helped a lot---she does not want another one as she says it was a very painful injection. She wants t pursue surgery  Right > L long finger is the issue but bilateral. She is right hand dominant.  Habits & Providers  Alcohol-Tobacco-Diet     Tobacco Status: current  Current Medications (verified): 1)  Aspirin 81 Mg Tbec (Aspirin) .... Take 1 Tablet By Mouth Once A Day 2)  Norvasc 10 Mg  Tabs (Amlodipine Besylate) .... Take 1 Tablet By Mouth Once A Day 3)  Ambien 10 Mg Tabs (Zolpidem Tartrate) .... Take 1 Pill By Mouth Daily. 4)  Plaquenil 200 Mg Tabs (Hydroxychloroquine Sulfate) .... Take 1 Tablet By Mouth Once A Day 5)  Amitriptyline Hcl 25 Mg Tabs (Amitriptyline Hcl) .... Take 1 Tab By Mouth At Bedtime 6)  Bupropion Hcl 100 Mg Tabs (Bupropion Hcl) .... Take 1 Tablet By Mouth Once A Day For 1 Week, Then Start Taking 1 Tab By Mouth Two Times A Day  Allergies: No Known Drug Allergies  Review of Systems  The patient denies fever.    Physical Exam  General:  alert, well-developed, well-nourished, and well-hydrated.   Msk:  Right long finger has palpable mass over flexor tendon and obvious sense of pop as she tries to move therough flexion. Left ling finger also has a pop but less impressive.   sensation to soft touch is intact B   Impression & Recommendations:  Problem # 1:  TRIGGER FINGER (ICD-727.03)  Bilateral R > L orthopedic referral at her request  Orders: [facility]- Est Level  3 (58099)  Complete Medication List: 1)  Aspirin 81 Mg Tbec (Aspirin) .... Take 1 tablet by mouth once a day 2)  Norvasc 10 Mg Tabs (Amlodipine  besylate) .... Take 1 tablet by mouth once a day 3)  Ambien 10 Mg Tabs (Zolpidem tartrate) .... Take 1 pill by mouth daily. 4)  Plaquenil 200 Mg Tabs (Hydroxychloroquine sulfate) .... Take 1 tablet by mouth once a day 5)  Amitriptyline Hcl 25 Mg Tabs (Amitriptyline hcl) .... Take 1 tab by mouth at bedtime 6)  Bupropion Hcl 100 Mg Tabs (Bupropion hcl) .... Take 1 tablet by mouth once a day for 1 week, then start taking 1 tab by mouth two times a day  Patient Instructions: 1)  APPT IS WITH DR Jenny Reichmann GRAVES;THURS DEC 22 @ 3:30. [phone]. [address]     Orders Added: 1)  [facility]- Est Level  3 [99213]  Appended Document: Elizabeth [name]    Clinical Lists Changes  Orders: Added new Service order of Est. Patient Level III (53976) - Signed       Complete Medication List: 1)  Aspirin 81 Mg Tbec (Aspirin) .... Take 1 tablet by mouth once a day 2)  Norvasc 10 Mg Tabs (Amlodipine besylate) .... Take 1 tablet by mouth once a day 3)  Ambien 10 Mg Tabs (Zolpidem tartrate) .... Take 1 pill by mouth daily. 4)  Plaquenil 200 Mg Tabs (Hydroxychloroquine sulfate) .... Take 1 tablet by mouth once a day 5)  Amitriptyline Hcl 25 Mg Tabs (Amitriptyline  hcl) .... Take 1 tab by mouth at bedtime 6)  Bupropion Hcl 100 Mg Tabs (Bupropion hcl) .... Take 1 tablet by mouth once a day for 1 week, then start taking 1 tab by mouth two times a day

## 2010-12-23 ENCOUNTER — Encounter: Payer: Self-pay | Admitting: Internal Medicine

## 2010-12-23 ENCOUNTER — Ambulatory Visit: Payer: Self-pay | Admitting: Internal Medicine

## 2010-12-30 ENCOUNTER — Ambulatory Visit (INDEPENDENT_AMBULATORY_CARE_PROVIDER_SITE_OTHER): Payer: Medicare Other | Admitting: Internal Medicine

## 2010-12-30 ENCOUNTER — Encounter: Payer: Self-pay | Admitting: Internal Medicine

## 2010-12-30 VITALS — BP 135/95 | HR 80 | Temp 97.4°F | Ht 62.0 in | Wt 178.9 lb

## 2010-12-30 DIAGNOSIS — E785 Hyperlipidemia, unspecified: Secondary | ICD-10-CM

## 2010-12-30 DIAGNOSIS — F329 Major depressive disorder, single episode, unspecified: Secondary | ICD-10-CM

## 2010-12-30 DIAGNOSIS — G4700 Insomnia, unspecified: Secondary | ICD-10-CM

## 2010-12-30 DIAGNOSIS — I10 Essential (primary) hypertension: Secondary | ICD-10-CM

## 2010-12-30 MED ORDER — ZOLPIDEM TARTRATE 10 MG PO TABS: 10.0000 mg | ORAL_TABLET | Freq: Every evening | ORAL | Status: DC | PRN

## 2010-12-30 MED ORDER — ASPIRIN 81 MG PO TBEC: 81.0000 mg | DELAYED_RELEASE_TABLET | Freq: Every day | ORAL | Status: DC

## 2010-12-30 MED ORDER — AMITRIPTYLINE HCL 25 MG PO TABS: 25.0000 mg | ORAL_TABLET | Freq: Every day | ORAL | Status: DC

## 2010-12-30 MED ORDER — HYDROXYCHLOROQUINE SULFATE 200 MG PO TABS: 200.0000 mg | ORAL_TABLET | Freq: Every day | ORAL | Status: DC

## 2010-12-30 MED ORDER — AMLODIPINE BESYLATE 10 MG PO TABS: 10.0000 mg | ORAL_TABLET | Freq: Every day | ORAL | Status: DC

## 2010-12-30 MED ORDER — BUPROPION HCL 100 MG PO TABS: 100.0000 mg | ORAL_TABLET | Freq: Two times a day (BID) | ORAL | Status: DC

## 2010-12-30 NOTE — Assessment & Plan Note (Signed)
Not at goal and this has to do with medical noncompliance and we have discussed this today. She has ran out of her refills and is ready to go pick it up today. We will not make any changes to her regimen today.

## 2010-12-30 NOTE — Assessment & Plan Note (Signed)
Well controlled on current regimen and will continue the same for now.

## 2010-12-30 NOTE — Assessment & Plan Note (Signed)
Will provide refill today, I have encouraged the patient to improve her sleeping habits as well such as no TV watching before going to bed, avoid coffee or tea, etc. Her TSH was recently checked and was WNL.

## 2010-12-30 NOTE — Patient Instructions (Signed)
Please schedule follow up appointment in 6 months.

## 2010-12-30 NOTE — Assessment & Plan Note (Signed)
Will repeat FLP on her next visit and will readjust the regimen as indicated. So far we have discussed the diet changes that would be necessary to implement.

## 2010-12-30 NOTE — Progress Notes (Signed)
  Subjective:    Patient ID: Elizabeth [name], female    DOB: Jan 23, 1956, 55 y.o.   MRN: [id number]  HPI  55 yo female with PMH outlined below presents to [facility] with main concern of insomnia and reports that she ran out of her medications and was not able to sleep. Now she feels tired but also feels like if she could get some sleep she would have more energy during the day time. She denies any systemic concerns such as fever,c hills, night sweats, changes in appetite or weight loss. She also denies any abdominal or urinary concerns. She has been having chronic right lower extremity pain and reports that ibuprofen helps with it. She is able to ambulate and is trying to exercise few times per week. In addition, she denies any recent hospitalizations or sicknesses, no weakness or dizziness.  Review of Systems  Constitutional: Negative.   HENT: Negative.   Respiratory: Negative.   Cardiovascular: Negative.   Gastrointestinal: Negative.   Genitourinary: Negative.   Musculoskeletal: Negative.   Neurological: Negative.   Psychiatric/Behavioral: Negative for hallucinations, behavioral problems, confusion, dysphoric mood, decreased concentration and agitation.       Objective:   Physical Exam  Constitutional: She appears well-developed and well-nourished. No distress.  HENT:  Head: Normocephalic and atraumatic.  Right Ear: External ear normal.  Left Ear: External ear normal.  Nose: Nose normal.  Mouth/Throat: Oropharynx is clear and moist. No oropharyngeal exudate.  Eyes: Conjunctivae and EOM are normal. Pupils are equal, round, and reactive to light. Right eye exhibits no discharge. Left eye exhibits no discharge. No scleral icterus.  Neck: Normal range of motion. Neck supple. No JVD present. No tracheal deviation present.  Cardiovascular: Normal rate, regular rhythm, normal heart sounds and intact distal pulses.  Exam reveals no gallop and no friction rub.   No murmur  heard. Pulmonary/Chest: Effort normal and breath sounds normal. No respiratory distress. She has no wheezes. She has no rales. She exhibits no tenderness.  Abdominal: Soft. Bowel sounds are normal. She exhibits no distension and no mass. There is no tenderness. There is no rebound and no guarding.  Musculoskeletal: Normal range of motion. She exhibits no edema and no tenderness.  Skin: Skin is warm and dry. No rash noted. She is not diaphoretic. No erythema. No pallor.  Psychiatric: She has a normal mood and affect. Her behavior is normal. Judgment and thought content normal.          Assessment & Plan:

## 2011-01-07 ENCOUNTER — Other Ambulatory Visit: Payer: Self-pay | Admitting: Internal Medicine

## 2011-01-07 DIAGNOSIS — Z1231 Encounter for screening mammogram for malignant neoplasm of breast: Secondary | ICD-10-CM

## 2011-01-29 ENCOUNTER — Ambulatory Visit
Admission: RE | Admit: 2011-01-29 | Discharge: 2011-01-29 | Disposition: A | Discharge status: Home / Self Care | Payer: Medicare Other | Source: Ambulatory Visit | Attending: *Deleted | Admitting: *Deleted

## 2011-01-29 DIAGNOSIS — Z1231 Encounter for screening mammogram for malignant neoplasm of breast: Secondary | ICD-10-CM

## 2011-02-02 LAB — CBC
MCHC: 33.4 g/dL (ref 30.0–36.0)
RBC: 4.76 MIL/uL (ref 3.87–5.11)
WBC: 5.6 10*3/uL (ref 4.0–10.5)

## 2011-02-02 LAB — CK TOTAL AND CKMB (NOT AT ARMC)
CK, MB: 1.4 ng/mL (ref 0.3–4.0)
Total CK: 115 U/L (ref 7–177)

## 2011-02-02 LAB — COMPREHENSIVE METABOLIC PANEL
ALT: 18 U/L (ref 0–35)
Calcium: 9.3 mg/dL (ref 8.4–10.5)
Creatinine, Ser: 0.78 mg/dL (ref 0.4–1.2)
GFR calc Af Amer: >60 mL/min (ref >60)
Glucose, Bld: 92 mg/dL (ref 70–99)
Sodium: 136 mEq/L (ref 135–145)
Total Protein: 7.9 g/dL (ref 6.0–8.3)

## 2011-02-02 LAB — RAPID URINE DRUG SCREEN, HOSP PERFORMED
Amphetamines: NOT DETECTED
Barbiturates: NOT DETECTED
Benzodiazepines: NOT DETECTED
Cocaine: POSITIVE — AB
Opiates: NOT DETECTED
Tetrahydrocannabinol: NOT DETECTED

## 2011-02-02 LAB — DIFFERENTIAL
Eosinophils Absolute: 0.1 10*3/uL (ref 0.0–0.7)
Lymphocytes Relative: 33 % (ref 12–46)
Lymphs Abs: 1.9 10*3/uL (ref 0.7–4.0)
Monocytes Relative: 9 % (ref 3–12)
Neutrophils Relative %: 56 % (ref 43–77)

## 2011-02-02 LAB — TROPONIN I

## 2011-04-03 NOTE — Cardiovascular Report (Signed)
NAME:  Elizabeth [name], Elizabeth [name]          ACCOUNT NO.:  [id number]   MEDICAL RECORD NO.:  [id number]          PATIENT TYPE:  INP   LOCATION:  6578                         FACILITY:  [facility]   PHYSICIAN:  Loretha Brasil. Lia Foyer, M.D. [facility] OF BIRTH:  08/14/1956   DATE OF PROCEDURE:  11/05/2004  DATE OF DISCHARGE:                              CARDIAC CATHETERIZATION   INDICATIONS:  The patient is a 55 year old female with a history of  microcytic anemia secondary to fibroids who presented with some substernal  chest pain which started at 10 p.m. yesterday.  This was associated with  shortness of breath.  She had a Cardiolite which suggested anteroseptal  ischemia.  Echocardiogram suggested normal LV function.  The patient has a  history of lupus.  The chest pain was thought to be atypical.  She was  brought to the catheterization laboratory for further evaluation.   PROCEDURE:  1.  Left heart catheterization.  2.  Selective coronary arteriography.  3.  Selective left ventriculography.   DESCRIPTION OF PROCEDURE:  The patient was brought to the catheterization  laboratory and prepped and draped in the usual fashion.  Through an anterior  puncture the right femoral artery was easily entered.  The patient had been  given 6 mg of intravenous Valium to help her relax.  With entering the  artery the patient jumped and the groin had to be held for approximately  five minutes.  We reentered through an anterior puncture.  A 6-French sheath  was then placed without difficulty.  The central aortic and left ventricular  pressures were measured with a pigtail.  Ventriculography was performed in  the RAO projection.  Following this views of the left and right coronary  arteries were obtained in multiple angiographic projections.  She tolerated  the procedure well without complication.  I held the groin for a minimum of  10 minutes and our staff held it for an additional 10-20 minutes with what  appeared to  be good hemostasis.  She was hypertensive and therefore  intravenous enalapril was administered to try to bring the blood pressure  down.   HEMODYNAMIC DATA:  1.  Central aortic pressure 149/105, mean 123.  2.  Left ventricle 150/18.  3.  No gradient on pullback across the aortic valve.   ANGIOGRAPHIC DATA:  1.  The ventriculogram was done in the RAO projection.  Because of      ventricular ectopy reliable ejection fraction could not be calculated.      This has been noted to be normal by echocardiogram.  2.  The left main was very short and basically consisted of two __________ .  3.  The left anterior descending artery courses to the apex.  In the mid      portion of the vessel is mild gentle narrowing of about 20-30%.  There      is a second diagonal branch which is fairly small being about 1 mm in      diameter.  This has a 70-80% ostial stenosis with a 90 degree takeoff      from the main vessel.  The mid  and distal LAD appear to wrap the apex      and there does not appear to be significant focal narrowing.  In the LAD      proper the large septal perforator appeared without critical narrowing.  4.  There is a large circumflex that provides predominantly a bifurcating      marginal branch.  It has an upward takeoff and bend but there does not      appear to be a high grade area of focal narrowing.  The vessel itself      looks relatively smooth without significant focal obstruction.  5.  The right coronary artery provides a simple PDA which is not extremely      large, has an upward takeoff, and then supplies a small posterior      descending artery.  The right is without significant focal narrowing.   CONCLUSIONS:  1.  Probable preserved overall left ventricular function, although      definitive statements about the left ventriculogram cannot be made      because of ventricular ectopy.  2.  Mild irregularity of the mid left anterior descending artery without      high grade  focal obstruction and high grade narrowing of a very small      second diagonal as noted above.  3.  No critical stenosis involving the circumflex or right coronary.   DISPOSITION:  The patient has had a positive urine screen for cocaine and  marijuana.  She admits to use of marijuana and cocaine in the past, but not  cocaine currently.  She questions whether this could be in the marijuana  itself.  With regard to her clinical status, she must change her habits.  The diagonal is really quite small and is certainly not very ideal for  percutaneous intervention.  The minimum lumen diameter is probably in the  range of about 1-1.5 mm at best.  She will need to be treated medically at  the present time with substantial change in lifestyle.  This has been  discussed with the patient in detail.       TDS/MEDQ  D:  11/05/2004  T:  11/06/2004  Job:  [id number]   cc:   [facility]   [facility]

## 2011-04-03 NOTE — Consult Note (Signed)
NAME:  Elizabeth [name], VANALSTINE NO.:  [id number]   MEDICAL RECORD NO.:  [id number]          PATIENT TYPE:  INP   LOCATION:  4696                         FACILITY:  [facility]   PHYSICIAN:  Junious Silk, M.D. LHCDATE OF BIRTH:  February 11, 1956   DATE OF CONSULTATION:  11/03/2004  DATE OF DISCHARGE:                                   CONSULTATION   HISTORY OF PRESENT ILLNESS:  Elizabeth [name] is a 55 year old African American  female who was admitted through [facility] by [facility] secondary to chest discomfort.  We were asked to consult  for chest discomfort.   Elizabeth [name] states that at approximately 8 p.m. Saturday evening, she ate a  pizza which consisted of sausage, steak and pepperoni.  At approximately 10  p.m. she developed a localized anterior sharp heavy pain in her chest.  This  did not radiate.  It gradually worsened and she initially attributed to  indigestion.  It initially started as a 6 and developed into a 14 on a scale  of 0 to 10.  She laid down, however, could not get comfortable.  She did  have some shortness of breath and what she described as hot flashes with the  discomfort.  She denied any nausea.  She stated that the discomfort  persisted all night and she was unable to sleep.  It was pleuritic in  nature.  It was worse with walking.  She did not feel that there was change  in movement, however, when she would lay on one side or the other, it would  be worse.  It was also tender to the touch.  Due to the persistence on  Saturday morning, her sister drove her to the emergency room for evaluation.  She has never had this discomfort before.  She states it does not resemble  her typical lupus or fibromyalgia pain.  She states it is difficult to  describe how her lupus and fibromyalgia feels. In the emergency room she  received multiple medications including nitroglycerin and GI cocktail.  However, she thinks that the morphine and  ibuprofen helped.  Since her  admission, she continues to have the discomfort; however, she states now it  is tolerable and gives it a 4 on a scale of 0 to 10.  The last time it was a  0 was on Saturday.   ALLERGIES:  No known drug allergies.   MEDICATIONS:  She does not take any prescription medications.  Occasionally  she will take over-the-counter Tylenol or ibuprofen.   PAST MEDICAL HISTORY:  1.  Microcytic anemia secondary to fibroids.  Since her hysterectomy in July      of 2004, she has not had any further problems.  2.  History of GERD.  3.  Lupus diagnosed approximately 10 years ago.  4.  Fibromyalgia.   PAST SURGICAL HISTORY:  Bilateral tubal ligation.   She denies any history of diabetes, hypertension, myocardial infarction,  CVA, COPD, bleeding or thyroid disorder.  She does not know her lipid  status.   SOCIAL HISTORY:  She resides in [place], [place], with her sister.  She is divorced.  She has one son, 77, and one daughter, 37, who are both  alive and well.  She is expecting her first grandchild.  She is on  disability secondary to lupus.  She smokes two to three cigarettes per day  and has been doing so for the last 15 years.  She will have two beers twice  a week.  She has not used cocaine in over five years.  She may smoke a joint  approximately one time per month; however, she cannot remember the last time  that she did smoke a joint.   FAMILY HISTORY:  Her mother died at the age of 96 with myocardial  infarction.  Her father died in his 15s of unknown causes.  She has one  sister who has a history of gout and diabetes, a brother with hypertension,  one sister died in her 60s of uterine cancer.   REVIEW OF SYMPTOMS:  In addition to above, is notable for hot flashes, a 20  pound weight gain in the last year, a need for glasses, a dental abscess  which she states it needs to be pulled; however, after being treated with  antibiotics from her dentist,  it is no longer causing problems.  Chronic  cough, general and exertional fatigue.  Postmenopausal.  Depression without  suicide thoughts, various arthralgias especially in her arms and legs, rare  constipation and nausea, heat intolerance.   PHYSICAL EXAMINATION:  GENERAL APPEARANCE:  A well-developed, well-  nourished, pleasant African American female in no apparent distress.  VITAL SIGNS:  Temperature 98.2, blood pressure 150/78, pulse 54 and regular,  respiratory rate 20, weight on admission was 166.5, 99% saturations on room  air.  HEENT:  Normocephalic and atraumatic.  PERRLA.  EOM intact.  Sclerae are  clear.  NECK:  Supple without thyromegaly, adenopathy, JVD or carotid bruits.  LUNGS:  Symmetrical excursion.  Clear to auscultation without wheezing,  rhonchi or rubs.  CARDIOVASCULAR:  Regular rate and rhythm without murmurs, rubs, clicks or  gallops.  All pulses are symmetric and equal without abdominal or femoral  bruits.  SKIN:  Integument intact.  ABDOMEN:  Positive bowel sounds without organomegaly, masses or tenderness.  EXTREMITIES:  No clubbing, cyanosis, or edema.  MUSCULOSKELETAL:  Unremarkable.  NEUROLOGIC:  Unremarkable.   It was noted on chest examination that she does have point tenderness in the  area that she refers to her chest symptoms.   Chest x-ray on November 02, 2004, showed cardiomegaly, no active disease.   EKG shows quite a bit of artifact.  There is one EKG that shows sinus  bradycardia with a rate of 55, it does not have all the artifact on other  EKGs.  Normal axis, normal intervals.  She does have some T-wave inversions  in I, aVL, V1 through V4.  Some of this may be due to lead placement as that  she had similar changes on her EKG in July 2004.   H&H was 14.0 and 41.9, normal indices, platelets 178, wbc 6.5.  Sodium 135,  potassium 3.5, BUN 13, creatinine 0.6, glucose 87.  Normal LFTs.  CK-MBs and troponins have been negative x2.  PTT 27, PT  11.9.  Urinalysis was positive  for nitrates and wbc and bacteria.  ETOH was less than 5.  Positive ANA AB  with a titer of 1.160.   IMPRESSION:  1.  Prolonged atypical chest discomfort with enzymes and EKGs negative  for      myocardial infarction.  2.  Hypertension.  3.  Urinary tract infection.  4.  Tobacco use.   History as previously described in past medical history.   RECOMMENDATIONS:  Dr. Vicenta Aly reviewed the patient's history, spoke with  and examined the patient.  Will review her echocardiogram  for left  ventricular function and pericardial effusion.  THS, FLP and D-dimer are  pending at this time.  Dr. Vicenta Aly does not feel her chest discomfort is  ischemic related; however, given her history  of lupus and pleuritic nature, he will review the echocardiogram.  With her  slightly abnormal EKG and her family history, he feels that she should  undergo stress Cardiolite testing while in the hospital as she is continuing  to have her chest discomfort to exclude an ischemic nature to her  discomfort.      Emil   EW/MEDQ  D:  11/03/2004  T:  11/04/2004  Job:  [id number]   cc:   [facility]   Dr. Sharlet Salina

## 2011-04-03 NOTE — Op Note (Signed)
NAME:  Elizabeth [name], Elizabeth [name]                    ACCOUNT NO.:  [id number]   MEDICAL RECORD NO.:  [id number]                   PATIENT TYPE:  INP   LOCATION:  X001                                 FACILITY:  [facility]   PHYSICIAN:  Selinda Orion, M.D.               DATE OF BIRTH:  12/23/1955   DATE OF PROCEDURE:  06/14/2003  DATE OF DISCHARGE:                                 OPERATIVE REPORT   PREOPERATIVE DIAGNOSES:  1. Uterine fibroids.  2. Anemia.  3. Comorbidities include lupus, extreme claustrophobia, and fibromyalgia,     and anemia status post blood transfusion.   POSTOPERATIVE DIAGNOSIS:  1. Uterine fibroids.  2. Anemia.  3. Comorbidities include lupus, extreme claustrophobia, and fibromyalgia,     and anemia status post blood transfusion.   OPERATION PERFORMED:  1. Pelvic examination under anesthesia.  2. Exploratory laparotomy.  3. Total abdominal hysterectomy and culdoplasty.   The patient was seen preoperatively in the holding area, voiced the  agreement that the procedure was to be performed, and expressed  understanding of the risks and benefits.  She was then taken to the  operating room under general anesthesia.  A pelvic exam was performed.  The  uterus was very large and had a large cul-de-sac component and appeared to  be somewhat mobile.  No other masses were noted.  The patient was then  prepped and draped and the Foley catheter was inserted.  It was decided that  a midline incision was the safest type of incision to do for this patient,  and it was made from the umbilicus down to the symphysis, hemostasis with  the Bovie.  The peritoneal cavity was entered vertically.  There was no free  fluid.  Exploration of the upper abdomen revealed the liver to be normal,  both kidneys appeared normal.  I detected no periaortic adenopathy.  The  abdominal viscera were then carefully packed away from the pelvic viscera  using the O'Connor-O'Sullivan retractor.  The  uterus was quite large.  Cornual aspects of the uterus were grasped with Kelly clamps, the round  ligaments were divided, and the utero-ovarian anastomosis and utero-ovarian  ligament and tube were clamped and ligated.  The bladder flap was created  and the uterine vessels were skeletonized on each side.  The right uterine  vessels were a little bit higher than the left.  We were able to secure the  vessels on each side and push the bladder flap downward and clamped the  cardinals and uterosacral ligaments.  Angles of the vagina were then  entered.  Prior to clamping the last uterosacral ligament, I did a  fundectomy so I could carefully assess the need for culdoplasty.  The  cervical stump was removed.  The vagina was closed in the vertical plane  using horizontal mattress sutures of 0 chromic and figure-of-eight sutures  of 0 Vicryl.  A culdoplasty was then performed using interrupted sutures  of  0 Vicryl.  Reperitonealization was accomplished with 3-0 Vicryl pop-offs.  Following this irrigation was accomplished, sponge and needle count was  verified to be correct, and the parietal peritoneum was closed with 2-0 PDS,  the fascia was closed with interrupted figure-of-eight sutures of 0 Novofil.  The subcutaneous was closed with interrupted sutures of 3-0 Vicryl and the  skin was closed with three mattress sutures of 3-0 nylon and then skin  clips.  I infiltrated the incision with 0.5% Marcaine with epinephrine.  A  dry sterile dressing was applied.  Elizabeth [name] tolerated the procedure well,  was sent to the recovery room in good condition.                                               Selinda Orion, M.D.    SDM/MEDQ  D:  06/14/2003  T:  06/14/2003  Job:  [id number]

## 2011-04-03 NOTE — Discharge Summary (Signed)
   NAME:  Elizabeth [name], Elizabeth [name]                    ACCOUNT NO.:  [id number]   MEDICAL RECORD NO.:  [id number]                   PATIENT TYPE:  INP   LOCATION:  2824                                 [facility]ACILITY:  [facility]   PHYSICIAN:  Selinda Orion, M.D.               DATE OF BIRTH:  03-25-1956   DATE OF ADMISSION:  06/14/2003  DATE OF DISCHARGE:  06/17/2003                                 DISCHARGE SUMMARY   ADMISSION DIAGNOSES:  1. Systemic lupus erythematosus.  2. Large uterine fibroids.  3. Anemia.   BRIEF HISTORY:  [name] is a 55 year old female admitted to the hospital  for hysterectomy for a large uterine fibroid.  She was anemic and had  required Lo-Ovral to stop her bleeding.  She had received 2 units of blood  in October and then 2 units in July.  Her comorbidities included lupus.   LABORATORIES:  Hemoglobin on admission 9.1, on discharge 8.2.  White count  on discharge was 9000.  Metabolic profile revealed a potassium of 3.3, a  creatinine of 0.6.  She was O+.   Cardiogram was normal.   HOSPITAL COURSE:  The patient was admitted to the hospital, underwent an  uneventful hysterectomy with the removal of an 885 g uterus.  This contained  multiple uterine fibroids.  Both ovaries were normal.  Her postoperative  course was uncomplicated.  She remained afebrile and without complaints and  was discharged on August 1 to be followed and seen in the office in two  weeks.   CONDITION ON DISCHARGE:  Improved.                                               Selinda Orion, M.D.    SDM/MEDQ  D:  06/25/2003  T:  06/25/2003  Job:  [id number]   cc:   Karoline Caldwell

## 2011-04-03 NOTE — Discharge Summary (Signed)
NAME:  Elizabeth [name], Elizabeth [name]          ACCOUNT NO.:  [id number]   MEDICAL RECORD NO.:  [id number]          PATIENT TYPE:  INP   LOCATION:  5409                         FACILITY:  [facility]   PHYSICIAN:  Carolin Guernsey, MD         DATE OF BIRTH:  Sep 18, 1956   DATE OF ADMISSION:  11/02/2004  DATE OF DISCHARGE:  11/06/2004                                 DISCHARGE SUMMARY   DISCHARGE DIAGNOSES:  1.  Coronary artery disease.  Catheterization showed 70 to 80% stenosis of      small diagonal artery.  2.  Urinary tract infection.  3.  Bradycardia.  4.  Gastroesophageal reflux disease.  5.  Systemic lupus erythematosus.  6.  Polysubstance abuse with urine drug screen positive for opiates,      cocaine, barbiturates and marijuana.  7.  Hyperlipidemia.  8.  Hypertension.   DISCHARGE MEDICATIONS:  1.  Aspirin 325 mg p.o. daily.  2.  Protonix 40 mg p.o. daily.  3.  Nitroglycerin 0.4 mg sublingual q.5 minutes x3 p.r.n. for chest pain.  4.  Imdur 30 mg p.o. q.a.m.  5.  Norvasc 2.5 mg p.o. daily.  6.  Ambien 5 mg p.o. q.h.s. p.r.n.  7.  Xanax 0.25 mg p.o. q.i.d. p.r.n.   FOLLOW UP:  Outpatient internal medicine on November 22, 2003, at 1:50 p.m.  for blood pressure check because she was just started on blood pressure and  anginal medication.  She will also follow up with [facility] on November 26, 2003, at 10:15 a.m.   CONSULTATION:  Britta Louth. Lia Foyer, M.D., [facility].   PROCEDURES:  1.  On November 02, 2004, chest x-ray showed cardiomegaly, no evidence of      active chest disease.  2.  On November 03, 2004, transesophageal echocardiogram showed overall left      ventricular systolic function normal, left ventricular ejection fraction      estimated between 55 to 65%.  No left ventricular regional wall motion      abnormalities.  Aortic valve thickness mildly increased.  No pericardial      effusion.  Pericardium normal in appearance.  Right atrial size normal.      Inferior vena cava  normal.  Pulmonic valve not well visualized.  The      structure of pulmonic valve appeared to be normal.  No pulmonic valve      stenosis.  No significant pulmonic regurgitation.  3.  On November 04, 2004, Cardiolite showed reversible defect in the      anteroseptal wall toward the apex concerning for ischemia.  Ejection      fraction 60.  4.  On November 05, 2004, cardiac catheterization showed 70 to 80 small      diagonal stenosis and 20 to 30 distal LAD stenosis.   HISTORY OF PRESENT ILLNESS:  This is a 55 year old African American lady  with past medical history significant for systemic lupus erythematosus,  status post hysterectomy, polysubstance abuse who presented to the ER on the  day of admission with complaint of precordial chest pain, 10/10, that has  been going on  for the past 10 hours, nonradiating and intensified by  inspiration, cough and touch.  It is associated with shortness of breath, no  diaphoresis, no vomiting, no nausea, no left arm or neck tingling.  The  patient also denied diarrhea or constipation.  She denied abdominal pain.  LFTs are not known.   PAST MEDICAL HISTORY:  1.  Systemic lupus erythematosus diagnosed 10 years ago.  2.  History of uterine fibroids status post hysterectomy in July 7846      complicated by microcytic anemia at that time.  3.  Fibromyalgia.  4.  GERD.   PHYSICAL EXAMINATION:  GENERAL APPEARANCE:  A 55 year old lady in no  apparent distress.  VITAL SIGNS:  Heart rate 65, blood pressure 141/81, temperature 98,  respiratory rate 28, oxygen saturation 99% on room air.  HEENT:  Eyes:  PERRLA.  Extraocular movements intact.  Three beat nystagmus  bilaterally to lateral gaze.  ENT:  Oropharynx clear.  No oral lesions.  NECK:  Supple.  No cervical lymphadenopathy.  LUNGS:  Clear to auscultation bilaterally. No rhonchi or wheezes.  CARDIOVASCULAR:  Bradycardia, no murmurs, rubs, or gallops.  There is a very  strong S2.  ABDOMEN:  Mild  left upper quadrant and right upper quadrant tenderness.  Bowel sounds positive.  Nondistended.  EXTREMITIES:  No edema.  There were 2+ pulses.  GU:  Deferred.  SKIN:  No rashes, no lesions.  Alopecia.  LYMPHS:  No lymphadenopathy.   LABORATORY DATA:  PT 11.9, INR 0.8, PTT 27.  Sodium 135, potassium 3.5,  chloride 105, bicarb 23, BUN 13, creatinine 0.6, glucose 87.  Anion gap 7.  Bilirubin 0.6, alkaline phosphatase 115, SGOT 21, SGPT 24, protein 7.2,  albumin 3.6, calcium 8.8, magnesium 2.2.  Hemoglobin 14, wbc 6.5, platelets  178.  ANC 4.1.  MCV 85.9.  Urinalysis showed moderate increase in nitrate,  leukocyte esterase, many bacteria and 3 to 6 white blood cells.  Cardiac  enzymes point of care negative x3.   EKG showed rate of 58, PR 192, QTC 438, normal axis, nonspecific ST changes.   HOSPITAL COURSE:  PROBLEM #1 -  CHEST PAIN:  In the differential diagnosis  we included chest pain with pleuritic origin versus cardiac origin versus  gastrointestinal origin versus costochondral origin.  Pain was atypical,  precordial with no radiation intensified by inspiration, cough, touch  associated with shortness of breath.  No diaphoresis.  No nausea.  We  checked an EKG which showed nonspecific changes and bradycardia.  We cycled  cardiac enzymes which were negative x3.  We checked a chest x-ray PA and  lateral which showed no acute abnormalities and we also checked a 2-D echo  which showed an ejection fraction of 55 to 60% with mild thickening of  aortic valve.  Electrolytes were within normal limits.  We also checked a  fasting lipid panel which showed total cholesterol of 167, triglycerides 57,  VLDL 11, LDL 100, HDL 56.  Urine drug screen was positive for opiates,  cocaine, barbiturates and marijuana.  Ethanol level was within normal  limits.  The patient received nitroglycerin and nitroglycerin drip and  morphine which did not relieve the pain.  The pain continued to be 8/10 for several  more hours and then subsided even though nitroglycerin drip was  stopped and morphine was also stopped.  She also received GI cocktail and  Protonix and nitroglycerin sublingual p.r.n.  Because her family history is  significant for mother dying at  67 years old, mild increase in lipid panel,  borderline hypertension, patient being postmenopausal, slightly overweight  and with systemic lupus erythematosus in past medical history we checked a  Cardiolite which was positive for anteroseptal ischemia.  Cardiolite was  followed by catheterization on the date of November 05, 2004.  Catheterization showed stenosis 70 to 80% of small diagonal artery.  At that  moment, cardiology recommended conservative treatment with medication,  nitrates and calcium channel blocker so the patient was started on Imdur 30  mg p.o. daily and Norvasc 2.5 mg daily with the mention that she will be  followed up by [facility] and Norvasc can be increased to 5 or 10 depending  on the blood pressure levels.  The patient was strongly advised to stop  using cocaine since cocaine alone can explain partially her chest pain, but  we had to take under consideration all the risk factors and the fact that  she is known with systemic lupus erythematosus so she needs further follow-  up and assessment.   PROBLEM #2 -  BRADYCARDIA:  At admission, the patient's heart rate was 65  and then decreased as low as in the 40s.  At discharge, heart rate is in the  60s.  The patient denied any medication at home.  We checked a TSH which was  within normal limits.  Probably her bradycardia is related to her history of  systemic lupus erythematosus which can create problems of conduction.  She  is followed by cardiology, Dr. Lia Foyer.   PROBLEM #3 -  GASTROESOPHAGEAL REFLUX DISEASE:  The patient was started on  Protonix.  She will continue on that at home.   PROBLEM #4 -  SYSTEMIC LUPUS ERYTHEMATOSUS:  The patient was diagnosed 10  years  ago with skin biopsy of her scalp.  We do not know for sure if that  was true or not.  Everything is according to the patient.  We checked this  admission ANA antibodies which showed an increased titer 1 to 160.  We also  checked double stranded DNA and anti-Smith antibody.  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  We recommend rheumatology consult for further assessment and  treatment.  Currently she is on no medication for her lupus.   PROBLEM #5 -  HYPERLIPIDEMIA:  Total cholesterol is slightly increased and  LDL is 100.  Follow-up by primary care physician.   PROBLEM #6 -  POLYSUBSTANCE ABUSE:  We strongly encouraged the patient to  quit smoking and quit cocaine use.  Follow-up by primary care physician.   PROBLEM #7 -  HYPERTENSION:  The patient was started on Imdur 30 mg p.o.  daily and Norvasc 2.5 mg p.o. daily.  Blood pressure was fluctuating over this hospitalization.  With higher blood pressure in 140/80, 140/100 and at  discharge was 121/85.  She needs close follow-up and assessment per primary  care physician.   PROBLEM #8 -  URINARY TRACT INFECTION:  At admission, the patient had  urinalysis positive for bacteria.  She was started on ciprofloxacin.  She  had a course of three days.  She was discharged home __________ medication  and symptoms negative.   PROBLEM #9 -  FIBROMYALGIA:  The patient has past medical history  significant for fibromyalgia.  I do not know how much this is an  overstatement given her history of systemic lupus erythematosus.  I think  this should be a diagnosis  of exclusion and she needs follow-up with  rheumatology for further work-up for SLE.   DISCHARGE LABORATORY DATA:  Sodium 137, potassium 3.5, chloride 104, bicarb  25, BUN 14, creatinine 0.8, glucose 90.  Hemoglobin 13.2, hematocrit 40.7,  wbc 5, platelets 172, MCV 85.8.  ANA 1 to 160.  Double stranded DNA pending.   Anti-Smith antibodies pending.  Homocystine level pending.  Antiphospholipid  antibody pending.       DA/MEDQ  D:  11/05/2004  T:  11/06/2004  Job:  [id number]   cc:   Dionne Ano [facility]

## 2011-04-03 NOTE — Discharge Summary (Signed)
NAME:  Elizabeth [name], Elizabeth [name]          ACCOUNT NO.:  [id number]   MEDICAL RECORD NO.:  [id number]          PATIENT TYPE:  OBV   LOCATION:  1638                         FACILITY:  [facility]   PHYSICIAN:  Evette Doffing, M.D.  DATE OF BIRTH:  [birth year]   DATE OF ADMISSION:  06/13/2008  DATE OF DISCHARGE:  06/14/2008                               DISCHARGE SUMMARY   DISCHARGE DIAGNOSES:  1. Cocaine induced chest pain, resolved.  2. Hypertension.  3. Polysubstance abuse with tobacco and cocaine.  4. Depression.  5. History of gastroesophageal reflux disease.  6. History of coronary artery disease status post cath and 2D      echocardiogram in December 2005 showing an ejection fraction of 60-      65%.  7. Nodular densities in the right lower lobe of the patient's long of      unclear etiology.   DISCHARGE MEDICATIONS:  1. Norvasc 10 mg p.o. daily.  2. Cymbalta 20 mg p.o. b.i.d.  3. Kapidex 60 mg p.o. daily.  4. Tylenol 650 mg p.o. q.6 h. p.r.n. for pain   DISPOSITION/FOLLOW UP:  The patient is to follow up with Dr. Venia Carbon at  the [facility] on June 28, 2008, at 3:10 p.m.  At  this time, the results of the patient's chest CT should be discussed and  appropriate follow up should be arranged.   PROCEDURES PERFORMED:  CT scan of the chest performed on June 14, 2008  with results pending upon discharge.   CONSULTATIONS:  None.   BRIEF ADMITTING HISTORY AND PHYSICAL:  Ms. Scarfo is a 55 year old  African American female with past medical history of hypertension,  coronary artery disease, polysubstance abuse with cocaine who presents  with acute onset of intermittent chest pain with associated shortness of  breath and palpitations that began the morning of the day prior to  admission.  These pains started approximately 1 hour after the use of  cocaine.  The chest pain is described to be retrosternal, left-sided,  and the patient could not classify if it was a  pressure/burning/sharp  pain.  The pain does not radiate and nothing makes it better or worse.  The patient's chest wall is tender to palpation and her chest pain is  nonpleuritic.  She has associated shortness of breath that has worsened  throughout the day and has also experienced some lightheadedness, but no  syncopal episodes.   PHYSICAL EXAMINATION:  VITAL SIGNS:  On admission, the patient had a  temperature of 97.1, blood pressure of 196/96, pulse of 68, respirations  of 18, and oxygen saturation of 97% on room air.  GENERAL:  The patient appeared to be in mild respiratory distress and  seemed anxious.  HEENT:  Anicteric sclerae with noninjected conjunctiva.  Oropharynx and  oral cavity were clear with moist mucous membranes.  NECK:  Supple without any JVD.  RESPIRATORY:  Clear to auscultation bilaterally.  CARDIOVASCULAR:  Regular rate and rhythm.  No murmurs, rubs, gallops and  normal S1 and S2.  CHEST:  Nontender to palpation.  GI:  Positive bowel sounds and soft, nontender, nondistended  abdomen.  EXTREMITY:  No clubbing, cyanosis or edema in 2+ dorsalis pedis pulses.  NEUROLOGIC:  Nonfocal with the patient alert and oriented x3. Cranial  nerves II through XII grossly intact and normal sensation and strength  throughout.  PSYCHIATRY:  The patient to be somewhat anxious and some mildly tearful.   LABORATORY DATA:  The patient's white blood cell count was 6.6,  hemoglobin of 13.2, platelets of 198, ANC of 3.5, and MCV of 88.  Sodium  136, potassium 3.0, chloride 103, bicarb 26, BUN 9, creatinine 0.7, and  glucose 100.  AST 23, ALT 24, total protein 7.4, albumin 4.0, and  calcium 8.9.  Urine drug screen was positive for cocaine and a chest x-  ray showed nodular densities projecting over the right lower lung which  is new since the patient's prior study.   HOSPITAL COURSE BY PROBLEM:  1. Chest Pain:  The patient's chest pain was thought most likely      secondary to her  recent cocaine use.  Acute coronary syndrome was      considered so an EKG was ordered showing some nonspecific T-wave      inversions.  However, the patient's cardiac enzymes remained      negative throughout her hospital course.  Other things also      considered less likely and subsequently ruled out included      pneumonia, pneumothorax, pericarditis, and GERD.  The patient was      given aspirin, morphine, and nitroglycerin patch in the emergency      room.  In addition, the patient was also started on Ativan and      Lipitor. Beta blockers were avoided since the chest pain was      secondary to cocaine.  The patient was only admitted for 23-hour      observation and she was completely asymptomatic without any      complaints of chest pain or shortness of breath by time of her      discharge.  2. Hypertension:  The patient was continued on her home Norvasc and      given Ativan for problem #1 which showed good effect and the      patient's blood pressure rapidly decreased to normal range by her      discharge date.  3. Lung Nodule:  On admission, the patient's initial chest x-ray      showed no acute findings but there was some incidentally noted      nodular densities in the patient's right lower lung.  Therefore,      the patient underwent a contrasted CT scan of her chest, the      results of which were pending upon discharge.  This will need to be      followed up on as an outpatient.  4. Polysubstance Abuse with Cocaine:  The patient was counseled by      members of the health care team regarding cessation of her drug      abuse and the patient seemed agreeable to these suggestions upon      discharge.  5. Depression:  The patient was stable and was continued on her home      doses of cymbalta.  6. Hypokalemia:  On admission, the patient's potassium was found to be      3.0 and was subsequently repleted with 40 mEq of K-Dur which showed      good effect by time of her  discharge.  DISCHARGE LABS AND VITALS UPON DISCHARGE:  The patient had a temperature  of 97.6, blood pressure 126/81, pulse of 57, respiratory rate of 20, and  sating 98% on room air.  White blood cell count was 5.0, hemoglobin  12.5, and platelets 188.  Sodium 138, potassium 4.3, chloride 105,  bicarb 27, BUN 10, creatinine 0.69, glucose 99, calcium 8.8, and total  bilirubin 0.7.  Alk phos 117, AST 21, ALT 20, total protein 6.5, and  albumin 3.4.      Stephanie Coup, MD  Electronically Signed      Evette Doffing, M.D.  Electronically Signed    RK/MEDQ  D:  06/16/2008  T:  06/16/2008  Job:  [id number]   cc:   Burton Apley, M.D.

## 2011-04-23 ENCOUNTER — Ambulatory Visit: Payer: Medicare Other | Admitting: Internal Medicine

## 2011-07-06 ENCOUNTER — Telehealth: Payer: Self-pay | Admitting: *Deleted

## 2011-07-28 ENCOUNTER — Other Ambulatory Visit: Payer: Self-pay | Admitting: *Deleted

## 2011-07-28 DIAGNOSIS — G4700 Insomnia, unspecified: Secondary | ICD-10-CM

## 2011-07-28 MED ORDER — ZOLPIDEM TARTRATE 10 MG PO TABS: 10.0000 mg | ORAL_TABLET | Freq: Every evening | ORAL | Status: DC | PRN

## 2011-07-28 NOTE — Telephone Encounter (Signed)
Rx faxed in.

## 2011-08-14 LAB — POCT CARDIAC MARKERS
CKMB, poc: 3
Myoglobin, poc: 86.6
Troponin i, poc: <0.05

## 2011-08-14 LAB — CBC
HCT: 37.2
HCT: 40.2
Hemoglobin: 12.5
MCHC: 33.5
MCV: 85.7
MCV: 87.6
Platelets: 198
RBC: 4.59
RDW: 14.3
RDW: 14.6
WBC: 6.6

## 2011-08-14 LAB — COMPREHENSIVE METABOLIC PANEL
ALT: 23
AST: 23
BUN: 10
Calcium: 8.8
Calcium: 8.9
GFR calc Af Amer: >60
GFR calc non Af Amer: >60
Glucose, Bld: 100 — ABNORMAL HIGH
Glucose, Bld: 99
Sodium: 136
Total Protein: 6.5
Total Protein: 7.4

## 2011-08-14 LAB — CARDIAC PANEL(CRET KIN+CKTOT+MB+TROPI)
CK, MB: 2.3
CK, MB: 2.9

## 2011-08-14 LAB — LIPID PANEL
HDL: 58
Total CHOL/HDL Ratio: 3.2
Triglycerides: 38
VLDL: 8

## 2011-08-14 LAB — MAGNESIUM: Magnesium: 2.3

## 2011-08-14 LAB — DIFFERENTIAL
Eosinophils Absolute: 0.1
Lymphs Abs: 2.5
Monocytes Relative: 8
Neutrophils Relative %: 53

## 2011-08-14 LAB — PROTIME-INR: INR: 0.9

## 2011-08-20 ENCOUNTER — Inpatient Hospital Stay (HOSPITAL_COMMUNITY)
Admission: EM | Admit: 2011-08-20 | Discharge: 2011-08-25 | DRG: 248 | Disposition: A | Discharge status: Home / Self Care | Payer: Medicare Other | Attending: Cardiology | Admitting: Cardiology

## 2011-08-20 ENCOUNTER — Emergency Department (HOSPITAL_COMMUNITY): Payer: Medicare Other

## 2011-08-20 DIAGNOSIS — I2510 Atherosclerotic heart disease of native coronary artery without angina pectoris: Secondary | ICD-10-CM | POA: Diagnosis present

## 2011-08-20 DIAGNOSIS — F17200 Nicotine dependence, unspecified, uncomplicated: Secondary | ICD-10-CM | POA: Diagnosis present

## 2011-08-20 DIAGNOSIS — I214 Non-ST elevation (NSTEMI) myocardial infarction: Principal | ICD-10-CM | POA: Diagnosis present

## 2011-08-20 DIAGNOSIS — K219 Gastro-esophageal reflux disease without esophagitis: Secondary | ICD-10-CM | POA: Diagnosis present

## 2011-08-20 DIAGNOSIS — R079 Chest pain, unspecified: Secondary | ICD-10-CM

## 2011-08-20 DIAGNOSIS — I10 Essential (primary) hypertension: Secondary | ICD-10-CM | POA: Diagnosis present

## 2011-08-20 DIAGNOSIS — F329 Major depressive disorder, single episode, unspecified: Secondary | ICD-10-CM | POA: Diagnosis present

## 2011-08-20 DIAGNOSIS — I5023 Acute on chronic systolic (congestive) heart failure: Secondary | ICD-10-CM | POA: Diagnosis not present

## 2011-08-20 DIAGNOSIS — I509 Heart failure, unspecified: Secondary | ICD-10-CM | POA: Diagnosis present

## 2011-08-20 DIAGNOSIS — M329 Systemic lupus erythematosus, unspecified: Secondary | ICD-10-CM | POA: Diagnosis present

## 2011-08-20 DIAGNOSIS — Z7902 Long term (current) use of antithrombotics/antiplatelets: Secondary | ICD-10-CM

## 2011-08-20 DIAGNOSIS — Z8249 Family history of ischemic heart disease and other diseases of the circulatory system: Secondary | ICD-10-CM

## 2011-08-20 DIAGNOSIS — Z79899 Other long term (current) drug therapy: Secondary | ICD-10-CM

## 2011-08-20 DIAGNOSIS — A5900 Urogenital trichomoniasis, unspecified: Secondary | ICD-10-CM | POA: Diagnosis present

## 2011-08-20 DIAGNOSIS — I2582 Chronic total occlusion of coronary artery: Secondary | ICD-10-CM | POA: Diagnosis present

## 2011-08-20 DIAGNOSIS — D509 Iron deficiency anemia, unspecified: Secondary | ICD-10-CM | POA: Diagnosis present

## 2011-08-20 DIAGNOSIS — E785 Hyperlipidemia, unspecified: Secondary | ICD-10-CM | POA: Diagnosis present

## 2011-08-20 DIAGNOSIS — Z7982 Long term (current) use of aspirin: Secondary | ICD-10-CM

## 2011-08-20 DIAGNOSIS — I2589 Other forms of chronic ischemic heart disease: Secondary | ICD-10-CM | POA: Diagnosis present

## 2011-08-20 DIAGNOSIS — F1410 Cocaine abuse, uncomplicated: Secondary | ICD-10-CM | POA: Diagnosis present

## 2011-08-20 DIAGNOSIS — IMO0001 Reserved for inherently not codable concepts without codable children: Secondary | ICD-10-CM | POA: Diagnosis present

## 2011-08-20 DIAGNOSIS — F3289 Other specified depressive episodes: Secondary | ICD-10-CM | POA: Diagnosis present

## 2011-08-20 DIAGNOSIS — E7800 Pure hypercholesterolemia, unspecified: Secondary | ICD-10-CM | POA: Diagnosis present

## 2011-08-20 LAB — DIFFERENTIAL
Basophils Relative: 0 % (ref 0–1)
Lymphs Abs: 2.5 10*3/uL (ref 0.7–4.0)
Monocytes Relative: 10 % (ref 3–12)
Neutro Abs: 3.4 10*3/uL (ref 1.7–7.7)
Neutrophils Relative %: 52 % (ref 43–77)

## 2011-08-20 LAB — PROTIME-INR: Prothrombin Time: 12.9 seconds (ref 11.6–15.2)

## 2011-08-20 LAB — POCT I-STAT, CHEM 8
BUN: 6 mg/dL (ref 6–23)
Chloride: 105 mEq/L (ref 96–112)
Creatinine, Ser: 0.8 mg/dL (ref 0.50–1.10)
Sodium: 141 mEq/L (ref 135–145)
TCO2: 24 mmol/L (ref 0–100)

## 2011-08-20 LAB — MRSA PCR SCREENING: MRSA by PCR: NEGATIVE

## 2011-08-20 LAB — CBC
Hemoglobin: 12.4 g/dL (ref 12.0–15.0)
MCH: 27.6 pg (ref 26.0–34.0)
MCV: 85.3 fL (ref 78.0–100.0)
RBC: 4.5 MIL/uL (ref 3.87–5.11)
WBC: 6.6 10*3/uL (ref 4.0–10.5)

## 2011-08-20 LAB — RAPID URINE DRUG SCREEN, HOSP PERFORMED
Barbiturates: NOT DETECTED
Benzodiazepines: NOT DETECTED
Cocaine: POSITIVE — AB
Opiates: POSITIVE — AB

## 2011-08-20 LAB — CARDIAC PANEL(CRET KIN+CKTOT+MB+TROPI)
CK, MB: 198 ng/mL (ref 0.3–4.0)
Relative Index: 8.8 — ABNORMAL HIGH (ref 0.0–2.5)
Troponin I: 2.53 ng/mL (ref <0.30)

## 2011-08-20 LAB — TSH: TSH: 2.105 u[IU]/mL (ref 0.350–4.500)

## 2011-08-20 LAB — TROPONIN I: Troponin I: 0.39 ng/mL (ref <0.30)

## 2011-08-20 LAB — HEMOGLOBIN A1C: Hgb A1c MFr Bld: 6.2 % — ABNORMAL HIGH (ref <5.7)

## 2011-08-20 LAB — HEPARIN LEVEL (UNFRACTIONATED): Heparin Unfractionated: 0.13 IU/mL — ABNORMAL LOW (ref 0.30–0.70)

## 2011-08-21 ENCOUNTER — Encounter: Payer: Medicare Other | Admitting: Internal Medicine

## 2011-08-21 DIAGNOSIS — I2510 Atherosclerotic heart disease of native coronary artery without angina pectoris: Secondary | ICD-10-CM

## 2011-08-21 LAB — LIPID PANEL
Cholesterol: 222 mg/dL — ABNORMAL HIGH (ref 0–200)
VLDL: 27 mg/dL (ref 0–40)

## 2011-08-21 LAB — CBC
Hemoglobin: 11.4 g/dL — ABNORMAL LOW (ref 12.0–15.0)
RBC: 4.14 MIL/uL (ref 3.87–5.11)

## 2011-08-21 LAB — HEPARIN LEVEL (UNFRACTIONATED)
Heparin Unfractionated: 0.34 IU/mL (ref 0.30–0.70)
Heparin Unfractionated: 0.35 IU/mL (ref 0.30–0.70)

## 2011-08-21 LAB — POCT ACTIVATED CLOTTING TIME
Activated Clotting Time: 127 seconds
Activated Clotting Time: 347 seconds

## 2011-08-21 LAB — CARDIAC PANEL(CRET KIN+CKTOT+MB+TROPI)
CK, MB: 333.1 ng/mL (ref 0.3–4.0)
Relative Index: 10.4 — ABNORMAL HIGH (ref 0.0–2.5)
Total CK: 2306 U/L — ABNORMAL HIGH (ref 7–177)
Troponin I: 14.55 ng/mL (ref <0.30)
Troponin I: >25 ng/mL (ref <0.30)

## 2011-08-21 LAB — BASIC METABOLIC PANEL
Calcium: 9.2 mg/dL (ref 8.4–10.5)
Creatinine, Ser: 0.62 mg/dL (ref 0.50–1.10)
GFR calc Af Amer: >90 mL/min (ref >90)

## 2011-08-22 ENCOUNTER — Inpatient Hospital Stay (HOSPITAL_COMMUNITY): Payer: Medicare Other

## 2011-08-22 DIAGNOSIS — I214 Non-ST elevation (NSTEMI) myocardial infarction: Secondary | ICD-10-CM

## 2011-08-22 LAB — URINE MICROSCOPIC-ADD ON

## 2011-08-22 LAB — URINALYSIS, ROUTINE W REFLEX MICROSCOPIC
Bilirubin Urine: NEGATIVE
Glucose, UA: NEGATIVE mg/dL
Specific Gravity, Urine: 1.017 (ref 1.005–1.030)
pH: 5.5 (ref 5.0–8.0)

## 2011-08-22 LAB — CBC
HCT: 35.1 % — ABNORMAL LOW (ref 36.0–46.0)
Hemoglobin: 11.5 g/dL — ABNORMAL LOW (ref 12.0–15.0)
MCHC: 32.8 g/dL (ref 30.0–36.0)
RBC: 4.07 MIL/uL (ref 3.87–5.11)

## 2011-08-22 LAB — BASIC METABOLIC PANEL
BUN: 9 mg/dL (ref 6–23)
CO2: 24 mEq/L (ref 19–32)
GFR calc non Af Amer: 75 mL/min — ABNORMAL LOW (ref >90)
Glucose, Bld: 110 mg/dL — ABNORMAL HIGH (ref 70–99)
Potassium: 3.5 mEq/L (ref 3.5–5.1)

## 2011-08-22 LAB — CARDIAC PANEL(CRET KIN+CKTOT+MB+TROPI): Relative Index: 3.3 — ABNORMAL HIGH (ref 0.0–2.5)

## 2011-08-23 DIAGNOSIS — I369 Nonrheumatic tricuspid valve disorder, unspecified: Secondary | ICD-10-CM

## 2011-08-23 LAB — BASIC METABOLIC PANEL
Calcium: 8.6 mg/dL (ref 8.4–10.5)
Creatinine, Ser: 0.76 mg/dL (ref 0.50–1.10)
GFR calc non Af Amer: >90 mL/min (ref >90)
Sodium: 136 mEq/L (ref 135–145)

## 2011-08-23 LAB — LIPID PANEL
Cholesterol: 172 mg/dL (ref 0–200)
Triglycerides: 52 mg/dL (ref <150)

## 2011-08-24 LAB — CBC
HCT: 36 % (ref 36.0–46.0)
Hemoglobin: 11.7 g/dL — ABNORMAL LOW (ref 12.0–15.0)
MCHC: 32.5 g/dL (ref 30.0–36.0)
MCV: 85.1 fL (ref 78.0–100.0)
RDW: 14.6 % (ref 11.5–15.5)
WBC: 9 10*3/uL (ref 4.0–10.5)

## 2011-08-24 LAB — BASIC METABOLIC PANEL
BUN: 11 mg/dL (ref 6–23)
Chloride: 103 mEq/L (ref 96–112)
Creatinine, Ser: 0.88 mg/dL (ref 0.50–1.10)
GFR calc Af Amer: 85 mL/min — ABNORMAL LOW (ref >90)
Glucose, Bld: 95 mg/dL (ref 70–99)
Potassium: 3.5 mEq/L (ref 3.5–5.1)

## 2011-08-25 LAB — CBC
HCT: 35.2 % — ABNORMAL LOW (ref 36.0–46.0)
Hemoglobin: 11.5 g/dL — ABNORMAL LOW (ref 12.0–15.0)
MCH: 27.9 pg (ref 26.0–34.0)
MCHC: 32.7 g/dL (ref 30.0–36.0)
RDW: 14.5 % (ref 11.5–15.5)

## 2011-08-25 NOTE — Cardiovascular Report (Signed)
  NAMEMarland Kitchen  Elizabeth, [name] NO.:  [id number]  MEDICAL RECORD NO.:  [id number]  LOCATION:  2905                         FACILITY:  [facility]  PHYSICIAN:  Peter M. Martinique, M.D.  DATE OF BIRTH:  1956/06/22  DATE OF PROCEDURE:  08/21/2011 DATE OF DISCHARGE:                           CARDIAC CATHETERIZATION   INDICATION FOR PROCEDURE:  The patient is a 55 year old black female who presented with a non-ST-elevation myocardial infarction.  She has a history of cocaine abuse.  She has had ongoing chest pain with ST- segment depression in the anterolateral leads.  Diagnostic cardiac catheterization demonstrates chronic total occlusion of the proximal right coronary artery with collateral flow.  The left circumflex coronary artery is occluded proximally with a hang-up suggesting an acute lesion.  There is a long 60%-70% stenosis in the mid LAD.  We recommended emergent intervention of the left circumflex coronary artery.  Access was using the right femoral access from her diagnostic study.  We exchanged for a 6-French sheath.  ADDITIONAL EQUIPMENTS:  A 6-French left 4 guide, an Asahi medium wire, a 2.5 x 15-mm sprinter balloon, a 3.0 x 22-mm Integrity bare metal stent, and a 3.25 x 15-mm [place] Sprinter balloon.  MEDICATIONS:  Angiomax 0.75 mg/kg followed by continuous effusion of 1.75 mg/kg per hour.  ACT was 360.  She was loaded with 600 mg of p.o. Plavix, nitroglycerin 200 mcg intracoronary x3.  After anticoagulation and initial guide shots, we crossed the lesion with moderate difficulty using the Asahi medium wire.  We then predilated the lesion with a Sprinter 2.5 x 15-mm balloon to 9 atmospheres with reperfusion.  We then stented the lesion using a 3.0 x 22-mm Integrity stent deploying this at 12 atmospheres.  It was postdilated with a 3.25 x 15-mm [place] Sprinter balloon to 14 atmospheres x2.  This yielded an excellent angiographic result with 0% residual stenosis and TIMI  grade 3 flow.  We then performed femoral angiography and closed the right femoral access using an Angio-Seal device with excellent hemostasis.  FINAL INTERPRETATION:  Successful intracoronary stenting of the proximal left circumflex coronary artery using a bare metal stent.  PLAN:  Continue on aspirin and Plavix.         ______________________________ Peter M. Martinique, M.D.    PMJ/MEDQ  D:  08/21/2011  T:  08/21/2011  Job:  [id number]  cc:   Lesesne C. Verl Blalock, MD, [facility]  Electronically Signed by PETER Martinique M.D. on 08/25/2011 01:04:25 PM

## 2011-08-27 LAB — CBC
MCHC: 33
MCV: 86.4
Platelets: 207
RBC: 5.36 — ABNORMAL HIGH
RDW: 15.2 — ABNORMAL HIGH

## 2011-08-27 LAB — POCT I-STAT CREATININE
Creatinine, Ser: 0.9
Operator id: 288831

## 2011-08-27 LAB — I-STAT 8, (EC8 V) (CONVERTED LAB)
BUN: 12
Chloride: 108
Glucose, Bld: 84
Potassium: 4
TCO2: 28
pH, Ven: 7.342 — ABNORMAL HIGH

## 2011-08-27 LAB — D-DIMER, QUANTITATIVE: D-Dimer, Quant: <0.22

## 2011-08-27 LAB — POCT CARDIAC MARKERS: CKMB, poc: 1.7

## 2011-08-28 LAB — CULTURE, BLOOD (ROUTINE X 2)
Culture  Setup Time: 201210061119
Culture: NO GROWTH

## 2011-09-03 NOTE — Cardiovascular Report (Signed)
  NAMEMarland Kitchen  BRAYLIN, FORMBY NO.:  [id number]  MEDICAL RECORD NO.:  [id number]  LOCATION:  2905                         FACILITY:  [facility]  PHYSICIAN:  Thayer Headings, M.D. DATE OF BIRTH:  [birth year]  DATE OF PROCEDURE:  08/21/2011 DATE OF DISCHARGE:                           CARDIAC CATHETERIZATION   HISTORY:  Elizabeth [name] is a 55 year old female who was admitted yesterday with a non-ST-segment elevation myocardial infarction.  She was positive for cocaine.  Her symptoms generally resolved on medical therapy.  This morning, she had recurrent chest pain and she had markedly elevated and rising troponin levels.  She is brought to the cath lab for further evaluation.  The patient is not able to give any significant history.  She states that her chest pain started 2 days ago when she was using cocaine.  She has a history of cocaine use in the past.  PROCEDURE:  Left heart catheterization with coronary angiography.  The right femoral artery was easily cannulated using modified Seldinger technique.  HEMODYNAMIC RESULTS:  LV pressure is 123/36.  The aortic pressure is 115/74.  ANGIOGRAPHY:  Left main: The left main is smooth and normal.  The left anterior descending artery has mild diffuse irregularities. There is a long 60%-70% stenosis in the mid-LAD.  This stenosis does not appear to obstruct flow.  The stenosis is somewhat calcified.  There is mild diffuse disease following this stenosis.  The left circumflex artery is large branch and is occluded proximally. There is dye hang up in the left circumflex/marginal vessel.  The right coronary artery is chronically occluded.  The distal right coronary artery fills via left-to-right collaterals.  The left ventriculogram was performed in a 30 RAO position.  It reveals mildly depressed left ventricular systolic function with an ejection fraction of 40%-45%.  She has inferior wall akinesis.  The aortic root is  mildly dilated.  Discussed the case with Dr. Martinique.  The chest pain started about 48 hours ago and occurred when she was using cocaine.  It is not clear whether opening this vessel at this point will benefit or not.  We will go ahead and try to open it in an effort to give her benefit of the doubt.  She is not a candidate for drug-eluting stent so we will anticipate placing a bare metal stent for successful in getting across the stenosis.  I will discuss these findings with her family.     Thayer Headings, M.D.     PJN/MEDQ  D:  08/21/2011  T:  08/21/2011  Job:  [id number]  cc:   [name], MD, [facility]  Electronically Signed by Mertie Moores M.D. on 09/03/2011 09:52:59 AM

## 2011-09-06 NOTE — H&P (Signed)
NAMEMarland Kitchen  BRELEE, RENK          ACCOUNT NO.:  [id number]  MEDICAL RECORD NO.:  [id number]  LOCATION:                                 FACILITY:  PHYSICIAN:  Wicke C. Ardella Chhim, MD, FACCDATE OF BIRTH:  [birth year]  DATE OF ADMISSION: DATE OF DISCHARGE:                             HISTORY & PHYSICAL   CHIEF COMPLAINT:  Severe chest tightness and pressure at 7 a.m. this morning.  HISTORY OF PRESENT ILLNESS:  Elizabeth [name] is a 55 year old African American female who has a previous history of coronary artery disease diagnosed in December 2005 by catheterization.  At that time, she had nonobstructive disease in the LAD and about 80% ostial stenosis of a diagonal 1.  She was treated medically because the vessel was small and she also had cocaine on board.  She continues to do cocaine.  The last time she did it was yesterday. She has some chest discomfort yesterday but did not seek medical attention.  She woke this morning at 7 a.m. with severe chest discomfort, shortness of breath, and was brought to the emergency department.  Here an EKG showed significant ST-segment downsloping depression in V2-V6 as well as 1 in AVL.  After IV nitroglycerin, nitro, morphine, EKG repeated at 0909 and show dramatic improvement with resolution of her ST-segment changes in V4-V6.  Residual changes and 1 in AVL and some ST-segment depression T-wave inversion in V1-V3.  She is currently with residual pain, but says this remarkably better than it was.  First troponin is 0.3.  PAST MEDICAL HISTORY:  She has no known drug allergies.  MEDICATIONS:  Norvasc 10 mg per day which she says she is taking.  MEDICAL PROBLEMS: 1. Microcytic anemia due to fibroids. 2. She is status post hysterectomy in July 2004. 3. GERD. 4. History of lupus, diagnosed 10 years ago without known activity. 5. Fibromyalgia.  SOCIAL HISTORY:  She lives in [place].  She is with her sister.  She is polysubstance abuse with  cocaine and also uses tobacco.  She denies alcohol.  She is followed in the outpatient clinic here at [facility].  She is divorced.  She has two children.  She is on disability because of her lupus.  FAMILY HISTORY:  Her mother died at 46 of a myocardial infarction. Father died in his 63s of unknown causes.  There is a history of diabetes and hypertension in a brother and a sister.  REVIEW OF SYSTEMS:  In addition to above, she denies any GERD, vomiting any blood, melena or hematochezia.  Rest review of systems are negative.  PHYSICAL EXAMINATION:  GENERAL:  She is well-developed, well-nourished black female, who is currently in no distress.  Blood pressure is 98/70, her pulse is 70 and regular.  She is in sinus rhythm.  No ectopy noted. Respiratory rate 18, sats are 98% on 2 liters.  Afebrile. HEENT:  Sclerae are clear.  PERRLA.  Face is symmetrical.  Teeth in relatively good shape with a few fillings. NECK:  Supple.  Carotid upstrokes are equal bilaterally without any obvious bruits.  Thyroid is not enlarged.  Trachea is midline.  No adenopathy. CHEST:  PMI nondisplaced.  Regular rate and rhythm without gallop or murmur.  LUNGS:  Clear to auscultation and percussion. ABDOMEN:  Soft and nondistended.  There is no obvious hepatomegaly. EXTREMITIES:  There is no cyanosis, clubbing, or edema.  Pulses are intact. NEURO:  She is alert and oriented x3, but a poor historian. SKIN:  Unremarkable.  ASSESSMENT/PLAN: 1. Non-ST segment elevation myocardial elevation.  Chest pain is still     present but remarkably better per the patient and she is in no     acute distress at the present time.  EKG is also dramatically     improved, but positive for cocaine use.  We will treat for pain     control and vasospasm with IV heparin, nitro, aspirin and morphine.     We will question at the time of catheterization depending on her     stability.  We will also send off a urine drug screen.  I have      discussed this with the patient and her sister and they understand     the severity of the problem and the plan.  We will admit to the     CCU. 2. Tobacco use.  We will obtain tobacco cessation consult. 3. Polysubstance abuse.  We will do counseling on this prior to     discharge. 4. Hypertension. 5. Known coronary artery disease. 6. Chronic microcytic anemia.     Rossa C. Verl Blalock, MD, [facility]     TCW/MEDQ  D:  08/20/2011  T:  08/20/2011  Job:  [id number]  Electronically Signed by Jenell Milliner MD [facility] on 09/06/2011 01:51:31 PM

## 2011-09-07 ENCOUNTER — Ambulatory Visit: Payer: Medicare Other | Admitting: Physician Assistant

## 2011-09-07 NOTE — Discharge Summary (Signed)
NAMEMarland Kitchen  Elizabeth [name], Elizabeth [name] NO.:  [id number]  MEDICAL RECORD NO.:  [id number]  LOCATION:  7616                         FACILITY:  [facility]  PHYSICIAN:  Sharonne Ricketts M. Martinique, M.D.  DATE OF BIRTH:  Nov 06, 1956  DATE OF ADMISSION:  08/20/2011 DATE OF DISCHARGE:  08/25/2011                              DISCHARGE SUMMARY   PRIMARY CARDIOLOGIST:  Marcello Moores C. Verl Blalock, MD, [facility]  PRIMARY CARE PROVIDER:  At [facility].  DISCHARGE DIAGNOSIS: Non ST-segment elevation myocardial infarction.  SECONDARY DIAGNOSES: 1. Coronary artery disease, status post bare-metal stenting in the     left circumflex this admission. 2. Ongoing tobacco abuse. 3. Ongoing cocaine abuse. 4. Hypertension. 5. Hyperlipidemia. 6. Gastroesophageal reflux disease. 7. History of microcytic anemia. 8. History of uterine fibroids. 9. Status post hysterectomy, July 2004. 10.History of lupus, apparently diagnosed 10 years ago. 11.Fibromyalgia. 12.Trichomonas urinary tract infection diagnosed this admission and     treated.  ALLERGIES:  No known drug allergies.  PROCEDURES: 1. Left heart cardiac catheterization performed on August 21, 2011,     revealing a normal left main.  Mild diffuse irregularities in the     LAD with 60-70% stenosis in the mid LAD and diffuse disease in the     distal vessel.  The left circumflex was occluded proximally with     dye hang-up suggestive of acute thrombosis.  Right coronary was     occluded proximally with the distal RCA filling via left-to-right     collaterals.  EF was 40-45% with inferior akinesis.  Films reviewed     and the left circumflex was felt to be the infarct vessel.  This     was successfully stented with placement of a 3.0 x 22-mm Integrity     bare-metal stent. 2. A 2D echocardiogram performed August 23, 2011, showing an EF 55%     with mild-to-moderate hypokinesis in the basal inferolateral and     inferior myocardium.  HISTORY OF PRESENT  ILLNESS:  A 55 year old [place] American female with prior history of nonobstructive CAD by catheterization in 2005.  She also has a history of intermittent cocaine abuse.  She used cocaine on October 3 and developed substernal chest discomfort, which was intermittent.  She awoke on the morning of October 4 with recurrent severe chest discomfort and dyspnea, and presented to the [facility] ED where she was noted to have significant ST-segment downsloping in leads V2 through V6 as well as I and aVL.  The patient was treated with IV nitroglycerin and morphine and repeat ECG showed improvement in ST- segment changes.  She also had improvement in pain.  Her first point of care troponin was elevated at 0.3 and she was admitted for further evaluation and management of non ST elevation MI.  HOSPITAL COURSE:  The patient eventually peaked her CK at 3832, MB 333.1, and troponin I at greater than 25.  The patient continued to have intermittent chest discomfort, transiently improved with morphine.  She underwent diagnostic catheterization on October 5 showing occluded left circumflex with dye hang-up suggestive of an acute occlusion.  The right coronary artery was also occluded with left-to-right collaterals filling the distal vessel.  She had  mild-to-moderate diffuse LAD stenosis.  The left circumflex was felt to be the infarct vessel.  After film reviewed, decision was made to pursue PCI of the left circumflex.  This was carried out successfully with placement of 3.0 x 22-mm Integrity bare- metal stent.  The patient tolerated this procedure well and postprocedure, has had no recurrent chest discomfort.  Because of active cocaine abuse, we have not been able to use beta-blocker therapy; however, she has been placed on low-dose aspirin, Plavix, high-dose statin, and also ACE inhibitor therapy.  She has been counseled extensively on the importance of complete cessation of tobacco and cocaine, and  she says she has quit.  Following her percutaneous intervention, the patient developed a fever up to 101.5 on October 6.  She also had significant dyspnea on exertion when working with cardiac rehab.  Further, she reported blood-tinged sputum.  A chest x-ray was performed on October 6 showing increased interstitial markings with small pleural effusions and there is question of an atypical pneumonia.  As a result, she was placed on Avelox therapy and has had improvement in her dyspnea and no further fevers.  Her white blood cell count has remained normal.  In the setting of the patient's fever, urinalysis was sent as were blood cultures.  While her blood cultures were negative, Trichomonas was noted in the patient's urinalysis.  She was provided the [facility] fax sheet and also educated with regards to the importance of contacting all sexual partners.  She was treated with Flagyl 2 g x1 on October 6.  The patient will be discharged home today in good condition with followup arranged in our office in approximately 2 weeks.  DISCHARGE LABS:  Hemoglobin 11.5, hematocrit 35.2, WBC 8.4, platelets 218.  INR 0.95.  Sodium 138, potassium 3.5, chloride 103, CO2 23, BUN 11, creatinine 0.8, glucose 95, calcium 9.0. Hemoglobin A1c 6.2.  CK 2174, MB 17.2, troponin-I greater than 25.  Total cholesterol 172, triglycerides 52, HDL 52, LDL 110.  TSH 2.105.  Urine drug screen positive for cocaine and opiates.  Urinalysis notable for Trichomonas. Blood cultures negative x2.  DISPOSITION:  The patient will be discharged home today in good condition.  FOLLOW UP PLANS AND APPOINTMENTS:  The patient will follow up with Richardson Dopp, PA and [facility] Cardiology office on October 22nd at 10 a.m.  She will continue to follow up at [facility].  DISCHARGE MEDICATIONS: 1. Aspirin 81 mg daily. 2. Plavix 75 mg daily. 3. Lipitor 80 mg at bedtime. 4. Lisinopril 5 mg daily. 5. Moxifloxacin 400 mg  daily x4 additional days. 6. Multivitamin 1 tab daily. 7. Nitroglycerin 0.4 mg sublingual p.r.n. chest pain. 8. Ambien 10 mg at bedtime p.r.n.  OUTSTANDING LABS AND STUDIES:  Followup lipids and LFTs in 6-8 weeks given new statin therapy.  She should have a followup basic metabolic panel when seen in the office on October 22 given new ACE inhibitor therapy.  DURATION DISCHARGE ENCOUNTER:  60 minutes including physician time.     Murray Hodgkins, ANP   ______________________________ Statia Burdick M. Martinique, M.D.    CB/MEDQ  D:  08/25/2011  T:  08/25/2011  Job:  [id number]  cc:   [facility]  Electronically Signed by Murray Hodgkins ANP on 08/26/2011 07:15:11 PM Electronically Signed by Dayzha Pogosyan Martinique M.D. on 09/07/2011 02:56:07 PM

## 2011-09-11 ENCOUNTER — Ambulatory Visit: Payer: Medicare Other | Admitting: Physician Assistant

## 2011-09-16 ENCOUNTER — Other Ambulatory Visit: Payer: Self-pay | Admitting: Cardiovascular Disease

## 2011-10-16 ENCOUNTER — Encounter: Payer: Medicare Other | Admitting: Cardiovascular Disease

## 2011-10-16 ENCOUNTER — Telehealth: Payer: Self-pay | Admitting: Cardiovascular Disease
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
## 2011-10-16 NOTE — Telephone Encounter (Signed)
I left a message for the pt that Dr Burt Knack will be coming into the office on Tuesday 10/20/11 and once the schedule change is made Lela will call her to reschedule the appointment.

## 2011-10-19 ENCOUNTER — Telehealth: Payer: Self-pay | Admitting: Cardiovascular Disease

## 2011-10-20 ENCOUNTER — Encounter: Payer: Medicare Other | Admitting: Cardiovascular Disease

## 2011-10-29 ENCOUNTER — Telehealth: Payer: Self-pay

## 2011-10-29 ENCOUNTER — Encounter: Payer: Self-pay | Admitting: Physician Assistant

## 2011-10-29 ENCOUNTER — Ambulatory Visit (INDEPENDENT_AMBULATORY_CARE_PROVIDER_SITE_OTHER): Payer: Medicare Other | Admitting: Physician Assistant

## 2011-10-29 VITALS — BP 140/80 | HR 77 | Ht 62.0 in | Wt 187.8 lb

## 2011-10-29 DIAGNOSIS — I2510 Atherosclerotic heart disease of native coronary artery without angina pectoris: Secondary | ICD-10-CM

## 2011-10-29 DIAGNOSIS — I10 Essential (primary) hypertension: Secondary | ICD-10-CM

## 2011-10-29 DIAGNOSIS — F1910 Other psychoactive substance abuse, uncomplicated: Secondary | ICD-10-CM

## 2011-10-29 DIAGNOSIS — I2589 Other forms of chronic ischemic heart disease: Secondary | ICD-10-CM

## 2011-10-29 DIAGNOSIS — I255 Ischemic cardiomyopathy: Secondary | ICD-10-CM | POA: Insufficient documentation

## 2011-10-29 DIAGNOSIS — L0291 Cutaneous abscess, unspecified: Secondary | ICD-10-CM

## 2011-10-29 DIAGNOSIS — E785 Hyperlipidemia, unspecified: Secondary | ICD-10-CM

## 2011-10-29 DIAGNOSIS — R0602 Shortness of breath: Secondary | ICD-10-CM

## 2011-10-29 DIAGNOSIS — L0390 Cellulitis, unspecified: Secondary | ICD-10-CM

## 2011-10-29 DIAGNOSIS — M79609 Pain in unspecified limb: Secondary | ICD-10-CM

## 2011-10-29 MED ORDER — CARVEDILOL 3.125 MG PO TABS: 3.1250 mg | ORAL_TABLET | Freq: Two times a day (BID) | ORAL | Status: DC

## 2011-10-29 MED ORDER — CEPHALEXIN 500 MG PO CAPS: 500.0000 mg | ORAL_CAPSULE | Freq: Three times a day (TID) | ORAL | Status: AC

## 2011-10-29 MED ORDER — LISINOPRIL 5 MG PO TABS: 5.0000 mg | ORAL_TABLET | Freq: Every day | ORAL | Status: DC

## 2011-10-29 MED ORDER — ASPIRIN 81 MG PO TBEC: 162.0000 mg | DELAYED_RELEASE_TABLET | Freq: Every day | ORAL | Status: DC

## 2011-10-29 NOTE — Assessment & Plan Note (Signed)
Doing well post MI.  She's had no further angina.  She does note difficulty with epistaxis and gum bleeding.  I discussed her case today with Dr. Burt Knack.  As she's had bleeding difficulties and was treated with a bare-metal stent, she can discontinue her Plavix now.  I asked her to increase her aspirin to 81 mg 2 tablets daily.  Followup with Dr. Jenell Milliner in 2-3 months.

## 2011-10-29 NOTE — Assessment & Plan Note (Signed)
As noted, she is committed to staying clean.  She understands that she needs to quit smoking.

## 2011-10-29 NOTE — Patient Instructions (Signed)
Your physician recommends that you schedule a follow-up appointment in: with Dr Verl Blalock in 2-3 months, and with the triage nurse for a BP check in 2 weeks (12/27 or 12/28)  Your physician has recommended you make the following change in your medication: Start Keflex 559m three times daily for 7 days, Increase your aspirin to two 854mtablets daily, start Coreg 3.12565mwice daily and stop your Plavix  Your physician recommends that you return for lab work in: today (BMET and BNP) and in two weeks (day of nurse visit) (Fasting Lipid and liver profile)  You need to get established with a primary phyisician at [facility]

## 2011-10-29 NOTE — Assessment & Plan Note (Signed)
I had a long, frank discussion with the patient regarding contraindication to beta blockers with cocaine abuse.  She is committed to staying clean.  She understands the risks of using cocaine in the setting of beta blocker administration.  I will start her on carvedilol 3.125 mg twice daily.  She will have a blood pressure check with the nurse in 2 weeks.

## 2011-10-29 NOTE — Assessment & Plan Note (Signed)
Uncontrolled.  However, she did not take her lisinopril yet today.  She has run out of this.  It has already been filled for her and she had already planned to pick it up today.  Add Coreg as noted.  Blood pressure check in 2 weeks.

## 2011-10-29 NOTE — Assessment & Plan Note (Signed)
The wounds on her arm are concerning for early cellulitis in the setting of an insect bite.  I will give her Keflex 500 mg 3 times a day for 7 days.

## 2011-10-29 NOTE — Telephone Encounter (Signed)
Opened in error
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
## 2011-10-29 NOTE — Assessment & Plan Note (Signed)
I suspect her dyspnea is mainly related to COPD.  She has a 40-pack-year history of smoking.  However, she had LV dysfunction at her heart catheterization.  I will obtain a basic metabolic panel and BNP today.  If her BNP is elevated, I will add furosemide her medical regimen.  I have asked her to followup with her PCP for further workup regarding possible COPD.  Of note, she has [place] access.  At this time she is not aware of who her PCP is.  We provided her with a phone number to call to find out this information.

## 2011-10-29 NOTE — Assessment & Plan Note (Signed)
She is fasting today.  Check lipids and LFTs.

## 2011-10-30 ENCOUNTER — Other Ambulatory Visit: Payer: Self-pay

## 2011-10-30 DIAGNOSIS — R0602 Shortness of breath: Secondary | ICD-10-CM

## 2011-10-30 DIAGNOSIS — E785 Hyperlipidemia, unspecified: Secondary | ICD-10-CM

## 2011-10-30 DIAGNOSIS — R0600 Dyspnea, unspecified: Secondary | ICD-10-CM

## 2011-10-30 DIAGNOSIS — I2510 Atherosclerotic heart disease of native coronary artery without angina pectoris: Secondary | ICD-10-CM

## 2011-10-30 LAB — HEPATIC FUNCTION PANEL
AST: 22 U/L (ref 0–37)
Alkaline Phosphatase: 142 U/L — ABNORMAL HIGH (ref 39–117)
Bilirubin, Direct: 0.1 mg/dL (ref 0.0–0.3)
Total Bilirubin: 0.4 mg/dL (ref 0.3–1.2)

## 2011-10-30 LAB — BASIC METABOLIC PANEL
Chloride: 107 mEq/L (ref 96–112)
Potassium: 4.2 mEq/L (ref 3.5–5.1)
Sodium: 139 mEq/L (ref 135–145)

## 2011-10-30 LAB — LIPID PANEL
HDL: 62.6 mg/dL (ref >39.00)
Total CHOL/HDL Ratio: 4
VLDL: 28.8 mg/dL (ref 0.0–40.0)

## 2011-10-30 LAB — BRAIN NATRIURETIC PEPTIDE: Pro B Natriuretic peptide (BNP): 235 pg/mL — ABNORMAL HIGH (ref 0.0–100.0)

## 2011-10-30 MED ORDER — FUROSEMIDE 20 MG PO TABS: 20.0000 mg | ORAL_TABLET | Freq: Every day | ORAL | Status: DC

## 2011-10-30 MED ORDER — POTASSIUM CHLORIDE ER 10 MEQ PO TBCR: 10.0000 meq | EXTENDED_RELEASE_TABLET | Freq: Every day | ORAL | Status: DC

## 2011-11-02 ENCOUNTER — Telehealth: Payer: Self-pay | Admitting: *Deleted

## 2011-11-02 MED ORDER — ROSUVASTATIN CALCIUM 40 MG PO TABS: 40.0000 mg | ORAL_TABLET | Freq: Every day | ORAL | Status: DC

## 2011-11-02 NOTE — Telephone Encounter (Signed)
Pt notified of lab results and med change to crestor 40 and to d/c lipitor. Julaine Hua

## 2011-11-11 ENCOUNTER — Other Ambulatory Visit: Payer: Medicare Other | Admitting: *Deleted

## 2011-11-13 NOTE — Progress Notes (Signed)
Addended by: Marlis Edelson C on: 11/13/2011 01:28 PM   Modules accepted: Orders

## 2011-11-16 ENCOUNTER — Encounter: Payer: Self-pay | Admitting: Cardiovascular Disease

## 2011-11-16 NOTE — Progress Notes (Signed)
Addended by: Laurence Compton on: 11/16/2011 02:09 PM   Modules accepted: Orders

## 2011-11-18 ENCOUNTER — Ambulatory Visit (INDEPENDENT_AMBULATORY_CARE_PROVIDER_SITE_OTHER): Payer: Medicare Other | Admitting: Internal Medicine

## 2011-11-18 ENCOUNTER — Encounter: Payer: Self-pay | Admitting: Internal Medicine

## 2011-11-18 ENCOUNTER — Ambulatory Visit (HOSPITAL_COMMUNITY)
Admission: RE | Admit: 2011-11-18 | Discharge: 2011-11-18 | Disposition: A | Discharge status: Home / Self Care | Payer: Medicare Other | Source: Ambulatory Visit | Attending: Internal Medicine | Admitting: Internal Medicine

## 2011-11-18 VITALS — BP 134/85 | HR 83 | Temp 97.1°F | Ht 62.0 in | Wt 187.1 lb

## 2011-11-18 DIAGNOSIS — M899 Disorder of bone, unspecified: Secondary | ICD-10-CM | POA: Insufficient documentation

## 2011-11-18 DIAGNOSIS — L0390 Cellulitis, unspecified: Secondary | ICD-10-CM

## 2011-11-18 DIAGNOSIS — R0602 Shortness of breath: Secondary | ICD-10-CM | POA: Insufficient documentation

## 2011-11-18 DIAGNOSIS — M5090 Cervical disc disorder, unspecified, unspecified cervical region: Secondary | ICD-10-CM | POA: Diagnosis not present

## 2011-11-18 DIAGNOSIS — L0291 Cutaneous abscess, unspecified: Secondary | ICD-10-CM

## 2011-11-18 DIAGNOSIS — R05 Cough: Secondary | ICD-10-CM

## 2011-11-18 DIAGNOSIS — M25519 Pain in unspecified shoulder: Secondary | ICD-10-CM

## 2011-11-18 DIAGNOSIS — G4700 Insomnia, unspecified: Secondary | ICD-10-CM

## 2011-11-18 DIAGNOSIS — S199XXA Unspecified injury of neck, initial encounter: Secondary | ICD-10-CM | POA: Diagnosis not present

## 2011-11-18 DIAGNOSIS — F329 Major depressive disorder, single episode, unspecified: Secondary | ICD-10-CM

## 2011-11-18 DIAGNOSIS — J984 Other disorders of lung: Secondary | ICD-10-CM

## 2011-11-18 DIAGNOSIS — I10 Essential (primary) hypertension: Secondary | ICD-10-CM

## 2011-11-18 DIAGNOSIS — F1910 Other psychoactive substance abuse, uncomplicated: Secondary | ICD-10-CM

## 2011-11-18 DIAGNOSIS — Z23 Encounter for immunization: Secondary | ICD-10-CM

## 2011-11-18 DIAGNOSIS — Z299 Encounter for prophylactic measures, unspecified: Secondary | ICD-10-CM | POA: Insufficient documentation

## 2011-11-18 DIAGNOSIS — M25511 Pain in right shoulder: Secondary | ICD-10-CM

## 2011-11-18 DIAGNOSIS — M542 Cervicalgia: Secondary | ICD-10-CM | POA: Insufficient documentation

## 2011-11-18 DIAGNOSIS — R059 Cough, unspecified: Secondary | ICD-10-CM | POA: Diagnosis not present

## 2011-11-18 MED ORDER — CYCLOBENZAPRINE HCL 10 MG PO TABS: 10.0000 mg | ORAL_TABLET | Freq: Three times a day (TID) | ORAL | Status: DC | PRN

## 2011-11-18 MED ORDER — LORAZEPAM 0.5 MG PO TABS: 0.5000 mg | ORAL_TABLET | Freq: Once | ORAL | Status: DC

## 2011-11-18 MED ORDER — CYCLOBENZAPRINE HCL 10 MG PO TABS: 10.0000 mg | ORAL_TABLET | Freq: Three times a day (TID) | ORAL | Status: AC | PRN

## 2011-11-18 MED ORDER — CHLORPHENIRAMINE MALEATE 2 MG/5ML PO SYRP: 4.0000 mg | ORAL_SOLUTION | Freq: Three times a day (TID) | ORAL | Status: DC | PRN

## 2011-11-18 MED ORDER — MELOXICAM 15 MG PO TABS: 15.0000 mg | ORAL_TABLET | Freq: Every day | ORAL | Status: DC

## 2011-11-18 MED ORDER — ZOLPIDEM TARTRATE 10 MG PO TABS: 10.0000 mg | ORAL_TABLET | Freq: Every evening | ORAL | Status: DC | PRN

## 2011-11-18 MED ORDER — CHLORPHENIRAMINE MALEATE 2 MG/5ML PO SYRP: 4.0000 mg | ORAL_SOLUTION | Freq: Three times a day (TID) | ORAL | Status: AC | PRN
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
## 2011-11-18 NOTE — Assessment & Plan Note (Signed)
Patient wants advised about sleep hygiene. She reports she has tried different things and nothing except Ambien. She needs to take it on a daily basis otherwise she would not get a good sleep and does not feel well.

## 2011-11-18 NOTE — Progress Notes (Signed)
Addended by: Ebbie Latus on: 11/18/2011 01:31 PM   Modules accepted: Orders

## 2011-11-18 NOTE — Assessment & Plan Note (Signed)
Patient was noted to have pulmonary nodules in the past and was recommended to have repeated a CT of the chest to followup on size.

## 2011-11-18 NOTE — Assessment & Plan Note (Signed)
She reports that she has been not taking any substances recently.

## 2011-11-18 NOTE — Assessment & Plan Note (Signed)
Likely viral. Chest x-ray does not show any acute process. Will prescribe syrup when necessary.

## 2011-11-18 NOTE — Assessment & Plan Note (Signed)
1. received a flu vaccination on December 13 and cardiology office 2. tetanus given today

## 2011-11-18 NOTE — Assessment & Plan Note (Signed)
Cellulitis on her right arm has resolved.
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
## 2011-11-18 NOTE — Assessment & Plan Note (Signed)
Patient reported that she has not been taking Coreg and Lasix as recommended by her cardiologist on December 13 since she was not sure why she had to take it and do to financial restraint. She noted that she will refill it this week. I underlined the importance to take the medication as prescribed.

## 2011-11-24 ENCOUNTER — Ambulatory Visit (HOSPITAL_COMMUNITY)
Admission: RE | Admit: 2011-11-24 | Discharge: 2011-11-24 | Disposition: A | Discharge status: Home / Self Care | Payer: Medicare Other | Source: Ambulatory Visit | Attending: Internal Medicine | Admitting: Internal Medicine

## 2011-11-24 ENCOUNTER — Encounter: Payer: Self-pay | Admitting: Internal Medicine

## 2011-11-24 DIAGNOSIS — R911 Solitary pulmonary nodule: Secondary | ICD-10-CM | POA: Diagnosis not present

## 2011-11-24 DIAGNOSIS — M25511 Pain in right shoulder: Secondary | ICD-10-CM

## 2011-11-24 DIAGNOSIS — J984 Other disorders of lung: Secondary | ICD-10-CM

## 2011-11-24 DIAGNOSIS — R918 Other nonspecific abnormal finding of lung field: Secondary | ICD-10-CM | POA: Diagnosis not present

## 2011-11-25 ENCOUNTER — Encounter: Payer: Self-pay | Admitting: Internal Medicine

## 2011-11-26 NOTE — Progress Notes (Signed)
Addended by: Rosalia Hammers on: 11/26/2011 01:29 PM   Modules accepted: Orders

## 2011-11-26 NOTE — Progress Notes (Signed)
Addended by: Laurence Compton on: 11/26/2011 09:00 AM   Modules accepted: Orders

## 2011-12-02 ENCOUNTER — Encounter: Payer: Self-pay | Admitting: Family Medicine

## 2011-12-02 ENCOUNTER — Ambulatory Visit (INDEPENDENT_AMBULATORY_CARE_PROVIDER_SITE_OTHER): Payer: Self-pay | Admitting: Family Medicine

## 2011-12-02 DIAGNOSIS — M5412 Radiculopathy, cervical region: Secondary | ICD-10-CM

## 2011-12-02 DIAGNOSIS — M25511 Pain in right shoulder: Secondary | ICD-10-CM

## 2011-12-02 DIAGNOSIS — M25519 Pain in unspecified shoulder: Secondary | ICD-10-CM

## 2011-12-02 MED ORDER — PREDNISONE 10 MG PO KIT: PACK | ORAL | Status: DC

## 2011-12-02 NOTE — Progress Notes (Signed)
Pt scheduled for appt for MRI C-spine and rt shoulder for 12/08/11 at 10am, pt is to arrive at 9:45am at [facility] radiology.
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
## 2011-12-02 NOTE — Patient Instructions (Addendum)
1. Start taking your prednisone tomorrow.  This should help the inflammation in your neck and in your shoulder.  2. We will call you to with your MRI appointment.  3. Follow up with me after your MRI is done.

## 2011-12-07 ENCOUNTER — Other Ambulatory Visit: Payer: Self-pay | Admitting: *Deleted

## 2011-12-07 MED ORDER — LORAZEPAM 1 MG PO TABS: ORAL_TABLET | ORAL | Status: DC

## 2011-12-07 NOTE — Assessment & Plan Note (Signed)
Her symptoms are a mix of radicular and local symptoms.  We have scheduled her to have an open MRI due to her anxiety.  In the meantime we will try to treat her symptoms with a course of prednisone.  This should address both local and radicular symptoms.  I have cautioned her about stiffness that may set in if she doesn't keep it active.  She is wary about doing exercises.  We did get her to agree to do pendulum exercises at least once a day.

## 2011-12-07 NOTE — Progress Notes (Signed)
Pt states she needs something for anxiety to take for before MRI.  Per Dr. Wynetta Emery- called in ativan 1 mg.

## 2011-12-08 ENCOUNTER — Other Ambulatory Visit (HOSPITAL_BASED_OUTPATIENT_CLINIC_OR_DEPARTMENT_OTHER): Payer: Medicare Other

## 2011-12-08 ENCOUNTER — Other Ambulatory Visit: Payer: Medicare Other

## 2011-12-12 ENCOUNTER — Ambulatory Visit (HOSPITAL_BASED_OUTPATIENT_CLINIC_OR_DEPARTMENT_OTHER): Payer: Medicare Other

## 2011-12-12 ENCOUNTER — Other Ambulatory Visit (HOSPITAL_BASED_OUTPATIENT_CLINIC_OR_DEPARTMENT_OTHER): Payer: Medicare Other

## 2011-12-15 ENCOUNTER — Other Ambulatory Visit (HOSPITAL_BASED_OUTPATIENT_CLINIC_OR_DEPARTMENT_OTHER): Payer: Medicare Other

## 2011-12-17 NOTE — Telephone Encounter (Signed)
Opened in error.

## 2011-12-19 ENCOUNTER — Other Ambulatory Visit (HOSPITAL_BASED_OUTPATIENT_CLINIC_OR_DEPARTMENT_OTHER): Payer: Medicare Other

## 2011-12-21 ENCOUNTER — Encounter: Payer: Self-pay | Admitting: *Deleted

## 2011-12-21 NOTE — Progress Notes (Signed)
Patient ID: Elizabeth [name], female   DOB: 1956/07/14, 56 y.o.   MRN: [id number] The imaging center at the [facility] called to inform us that the pt did not come to her imaging study on Saturday. The pt was called on Friday to confirm the appt and had previously missed or rescheduled four times.

## 2011-12-29 ENCOUNTER — Other Ambulatory Visit: Payer: Medicare Other | Admitting: *Deleted

## 2012-01-16 ENCOUNTER — Ambulatory Visit (HOSPITAL_BASED_OUTPATIENT_CLINIC_OR_DEPARTMENT_OTHER)
Admission: RE | Admit: 2012-01-16 | Discharge: 2012-01-16 | Disposition: A | Discharge status: Home / Self Care | Payer: Medicare Other | Source: Ambulatory Visit | Attending: Family Medicine | Admitting: Family Medicine

## 2012-01-16 DIAGNOSIS — M25519 Pain in unspecified shoulder: Secondary | ICD-10-CM

## 2012-01-16 DIAGNOSIS — M542 Cervicalgia: Secondary | ICD-10-CM | POA: Insufficient documentation

## 2012-01-16 DIAGNOSIS — M5030 Other cervical disc degeneration, unspecified cervical region: Secondary | ICD-10-CM | POA: Diagnosis not present

## 2012-01-16 DIAGNOSIS — M5412 Radiculopathy, cervical region: Secondary | ICD-10-CM

## 2012-01-16 DIAGNOSIS — M5380 Other specified dorsopathies, site unspecified: Secondary | ICD-10-CM | POA: Insufficient documentation

## 2012-01-16 DIAGNOSIS — M5020 Other cervical disc displacement, unspecified cervical region: Secondary | ICD-10-CM | POA: Diagnosis not present

## 2012-01-16 DIAGNOSIS — M79609 Pain in unspecified limb: Secondary | ICD-10-CM | POA: Diagnosis not present

## 2012-01-16 DIAGNOSIS — M47812 Spondylosis without myelopathy or radiculopathy, cervical region: Secondary | ICD-10-CM

## 2012-01-28 ENCOUNTER — Ambulatory Visit: Payer: Medicare Other | Admitting: Cardiology

## 2012-02-02 DIAGNOSIS — H40009 Preglaucoma, unspecified, unspecified eye: Secondary | ICD-10-CM | POA: Diagnosis not present

## 2012-02-05 ENCOUNTER — Telehealth: Payer: Self-pay | Admitting: *Deleted

## 2012-02-05 NOTE — Telephone Encounter (Signed)
Scheduled pt for appt with Dr. Lynann Bologna 02/09/12 @ 9am.  Pt notified of MRI results and appt info today via phone.

## 2012-02-05 NOTE — Telephone Encounter (Signed)
Message copied by Ocie Bob on Fri Feb 05, 2012  1:59 PM ------      Message from: Claris Gower      Created: Wed Feb 03, 2012  2:53 PM      Regarding: Appointment needed       Ms. Hurtado needs to have a consultation with a spine surgeon for her abnormal MRI.  She was not available when I tried to call her.  Her sister is supposed to have her call the clinic back.  If I am not here when she calls back please schedule her an appointment with either Dr. Lynann Bologna or Dr. Lorin Mercy.

## 2012-02-09 DIAGNOSIS — M5412 Radiculopathy, cervical region: Secondary | ICD-10-CM | POA: Diagnosis not present

## 2012-03-14 ENCOUNTER — Other Ambulatory Visit: Payer: Self-pay | Admitting: *Deleted

## 2012-03-14 DIAGNOSIS — G4700 Insomnia, unspecified: Secondary | ICD-10-CM

## 2012-03-14 MED ORDER — ZOLPIDEM TARTRATE 10 MG PO TABS: 10.0000 mg | ORAL_TABLET | Freq: Every evening | ORAL | Status: DC | PRN

## 2012-03-14 NOTE — Telephone Encounter (Signed)
Ambien rx called to [place].

## 2012-03-29 DIAGNOSIS — H40019 Open angle with borderline findings, low risk, unspecified eye: Secondary | ICD-10-CM | POA: Diagnosis not present

## 2012-04-01 ENCOUNTER — Encounter: Payer: Self-pay | Admitting: Internal Medicine

## 2012-04-01 ENCOUNTER — Ambulatory Visit (INDEPENDENT_AMBULATORY_CARE_PROVIDER_SITE_OTHER): Payer: Medicare Other | Admitting: Internal Medicine

## 2012-04-01 VITALS — BP 154/92 | HR 70 | Temp 97.5°F | Resp 20 | Ht 62.0 in | Wt 188.0 lb

## 2012-04-01 DIAGNOSIS — I10 Essential (primary) hypertension: Secondary | ICD-10-CM | POA: Diagnosis not present

## 2012-04-01 DIAGNOSIS — R400 Somnolence: Secondary | ICD-10-CM | POA: Insufficient documentation

## 2012-04-01 DIAGNOSIS — E785 Hyperlipidemia, unspecified: Secondary | ICD-10-CM | POA: Diagnosis not present

## 2012-04-01 DIAGNOSIS — G4710 Hypersomnia, unspecified: Secondary | ICD-10-CM | POA: Diagnosis not present

## 2012-04-01 DIAGNOSIS — J329 Chronic sinusitis, unspecified: Secondary | ICD-10-CM | POA: Insufficient documentation

## 2012-04-01 DIAGNOSIS — R6889 Other general symptoms and signs: Secondary | ICD-10-CM | POA: Diagnosis not present

## 2012-04-01 DIAGNOSIS — R196 Halitosis: Secondary | ICD-10-CM

## 2012-04-01 LAB — LIPID PANEL
Cholesterol: 235 mg/dL — ABNORMAL HIGH (ref 0–200)
Triglycerides: 80 mg/dL (ref <150)
VLDL: 16 mg/dL (ref 0–40)

## 2012-04-01 MED ORDER — AMOXICILLIN 500 MG PO TABS: 500.0000 mg | ORAL_TABLET | Freq: Two times a day (BID) | ORAL | Status: AC

## 2012-04-01 NOTE — Progress Notes (Signed)
Subjective:   Patient ID: Elizabeth [name] female   DOB: 1956/04/25 56 y.o.   MRN: [id number]  HPI: [name] is a 56 y.o. woman who presents to clinic today for follow up of her chronic medical conditions including hypertension and hyperlipidemia.  She also has several acute complaints today.    She states that she has been dealing with three weeks of yellow, creamy drainage down the back of the throat and out the nose. She also has noted a bad smell left nostril.  She denies fevers or chills but has noted some cough with black, yellowish, creamy production.  She states that she has had sinus infections before.    She has also noted that she has been tired for sometime.  She states that she has times during the day where she will fall asleep.  She also has noted that when she wakes up she does not feel refreshed.  She usually tries to go to bed around 9-10 pm and wakes up around 7 am.  She has been taking the ambien to get any sleep.  She states that she has been told that she snores and has actually woken herself from sleep because of her snoring in the past.  She denies any morning headaches or witnessed apnea spells at night.    She states that she had a colonoscopy done several years back by Dr. Benson Norway at his [place] office.  We have no record of this in our system and encouraged her to have Dr. Benson Norway send [place] a copy of the report for our records.   Past Medical History  Diagnosis Date  . Hypertension   . HLD (hyperlipidemia)     Chol = 235, LDL = 156 (08/2010)  . Substance abuse     cocaine   . Depression   . Lupus 2009    ANA + 10/2008, repeat ANA + (05/2009), on that visit 05/2009 following labs obtained RF <20, CRP <0.4, ANA titers 1:80,   . CAD (coronary artery disease)     NSTEMI 08/2011:  LHC 08/21/11: mLAD 60-70%, pCFX occluded, dRCA chronic occlusion with L-R collats, EF 40-45%, inf AK.  PCI:  BMS to CFX.  Marland Kitchen Pulmonary nodules 05/2008    noted on CXR and CT  05/2008, repeat CT 10/2008 - Stable small bilateral pulmonary nodules measuring up to 6 m m  . Insomnia   . GERD (gastroesophageal reflux disease)   . Fibroids   . History of microcytic hypochromic anemia   . Fibromyalgia    Current Outpatient Prescriptions  Medication Sig Dispense Refill  . aspirin 81 MG EC tablet Take 2 tablets (162 mg total) by mouth daily.  30 tablet  5  . carvedilol (COREG) 3.125 MG tablet Take 1 tablet (3.125 mg total) by mouth 2 (two) times daily.  60 tablet  11  . furosemide (LASIX) 20 MG tablet Take 1 tablet (20 mg total) by mouth daily.  30 tablet  3  . lisinopril (PRINIVIL,ZESTRIL) 5 MG tablet Take 1 tablet (5 mg total) by mouth daily.  30 tablet  11  . LORazepam (ATIVAN) 0.5 MG tablet Take 1 tablet (0.5 mg total) by mouth once. Take one tablet prior to getting your MRI only. Take it just 30 min before.  1 tablet  0  . LORazepam (ATIVAN) 1 MG tablet Take tablet 30 minutes to 1 hour prior to getting MRI.  1 tablet  0  . meloxicam (MOBIC) 15 MG tablet Take 1  tablet (15 mg total) by mouth daily.  14 tablet  0  . nitroGLYCERIN (NITROSTAT) 0.4 MG SL tablet Place 0.4 mg under the tongue every 5 (five) minutes as needed.        . potassium chloride (K-DUR) 10 MEQ tablet Take 1 tablet (10 mEq total) by mouth daily.  30 tablet  3  . PredniSONE (STERAPRED DS 12 DAY) 10 MG KIT Use as directed  1 kit  0  . rosuvastatin (CRESTOR) 40 MG tablet Take 1 tablet (40 mg total) by mouth daily.  30 tablet  6  . zolpidem (AMBIEN) 10 MG tablet Take 1 tablet (10 mg total) by mouth at bedtime as needed.  30 tablet  4   Family History  Problem Relation Age of Onset  . Heart disease Mother   . Cervical cancer Sister   . Prostate cancer Brother   . Colon cancer Neg Hx    History   Social History  . Marital Status: Divorced    Spouse Name: N/A    Number of Children: N/A  . Years of Education: N/A   Social History Main Topics  . Smoking status: Current Everyday Smoker -- 0.3  packs/day    Types: Cigarettes  . Smokeless tobacco: Not on file   Comment: 3 cigs per day- given QUIT line info  . Alcohol Use: Yes     beer 1-2 per day  . Drug Use: No  . Sexually Active: Not Currently   Other Topics Concern  . Not on file   Social History Narrative  . No narrative on file   Review of Systems: Negative except as noted in the HPI.   Objective:  Physical Exam: Filed Vitals:   04/01/12 1324  BP: 154/92  Pulse: 70  Temp: 97.5 F (36.4 C)  TempSrc: Oral  Resp: 20  Height: _0  (1.575 m)  Weight: 188 lb (85.276 kg)   Constitutional: Vital signs reviewed.  Patient is a well-developed and well-nourished woman in no acute distress and cooperative with exam. Alert and oriented x3.  Head: Normocephalic and atraumatic Ear: TM normal bilaterally Nose: White exudates noted in the right nostril with erythematous and swollen turbinates bilaterally.  Mouth: no erythema or exudates, MMM, grade 3 tonsils with a low hanging palate. No exudates or cobblestoning noted. Very poor dentition noted with moderate halitosis.  Eyes: PERRL, EOMI, conjunctivae normal, No scleral icterus.  Neck: Supple, Trachea midline normal ROM, No JVD, mass, thyromegaly, or carotid bruit present.  Cardiovascular: RRR, S1 normal, S2 normal, no MRG, pulses symmetric and intact bilaterally Pulmonary/Chest: CTAB, no wheezes, rales, or rhonchi Abdominal: Soft. Non-tender, non-distended, bowel sounds are normal, no masses, organomegaly, or guarding present.  GU: no CVA tenderness Musculoskeletal: No joint deformities, erythema, or stiffness, ROM full and no nontender Hematology: no cervical, inginal, or axillary adenopathy.  Neurological: A&O x3, Strength is normal and symmetric bilaterally, cranial nerve II-XII are grossly intact, no focal motor deficit, sensory intact to light touch bilaterally.  Skin: Warm, dry and intact. No rash, cyanosis, or clubbing.  Psychiatric: Normal mood and affect. speech  and behavior is normal. Judgment and thought content normal. Cognition and memory are normal.   Assessment & Plan:

## 2012-04-01 NOTE — Patient Instructions (Addendum)
1.  Start the Amoxicillin.  500 mg tablets take 1 tablet twice daily for your sinuses  - Consider a Neti-Pot for sinus rinses as well.  This can help clear up the infection  2.  We will work to make you an appointment for a sleep study to see how you are resting at night.    3.  Make sure you get over to a dentist again soon.  4.  Stop in the lab to have your blood drawn.  If we need to follow up on anything I will call you.  5.  Follow up with me in about 3 months.

## 2012-04-25 ENCOUNTER — Ambulatory Visit (HOSPITAL_BASED_OUTPATIENT_CLINIC_OR_DEPARTMENT_OTHER): Payer: Medicare Other | Attending: Internal Medicine | Admitting: Radiology

## 2012-04-25 VITALS — Ht 62.0 in | Wt 188.0 lb

## 2012-04-25 DIAGNOSIS — R0609 Other forms of dyspnea: Secondary | ICD-10-CM | POA: Diagnosis not present

## 2012-04-25 DIAGNOSIS — G4761 Periodic limb movement disorder: Secondary | ICD-10-CM | POA: Insufficient documentation

## 2012-04-25 DIAGNOSIS — G4733 Obstructive sleep apnea (adult) (pediatric): Secondary | ICD-10-CM | POA: Insufficient documentation

## 2012-04-25 DIAGNOSIS — R400 Somnolence: Secondary | ICD-10-CM

## 2012-04-25 DIAGNOSIS — R0989 Other specified symptoms and signs involving the circulatory and respiratory systems: Secondary | ICD-10-CM | POA: Insufficient documentation

## 2012-04-30 DIAGNOSIS — G4733 Obstructive sleep apnea (adult) (pediatric): Secondary | ICD-10-CM | POA: Diagnosis not present

## 2012-04-30 DIAGNOSIS — G4761 Periodic limb movement disorder: Secondary | ICD-10-CM | POA: Diagnosis not present

## 2012-04-30 DIAGNOSIS — R0989 Other specified symptoms and signs involving the circulatory and respiratory systems: Secondary | ICD-10-CM | POA: Diagnosis not present

## 2012-04-30 DIAGNOSIS — R0609 Other forms of dyspnea: Secondary | ICD-10-CM

## 2012-04-30 NOTE — Procedures (Signed)
NAME:  Elizabeth [name], Elizabeth [name]          ACCOUNT NO.:  [id number]  MEDICAL RECORD NO.:  [id number]          PATIENT TYPE:  OUT  LOCATION:  SLEEP CENTER                 FACILITY:  [facility]  PHYSICIAN:  Sherill Wegener D. Annamaria Boots, MD, FCCP, FACPDATE OF BIRTH:  04-20-1956  DATE OF STUDY:  04/25/2012                           NOCTURNAL POLYSOMNOGRAM  REFERRING PHYSICIAN:  Jay Schlichter, M.D.  REFERRING PHYSICIAN:  Trish Fountain, MD  INDICATION FOR STUDY:  Hypersomnia with sleep apnea.  EPWORTH SLEEPINESS SCORE:  11/24.  BMI 34.4, weight 188 pounds, height 62 inches, neck 13.5 inches.  HOME MEDICATIONS:  Charted and reviewed.  SLEEP ARCHITECTURE:  Total sleep time 311 minutes with sleep efficiency 74.1%.  Stage I was 5.8%, stage II 70.3%, stage III absent, REM 24% of total sleep time.  Sleep latency 58.5 minutes, REM latency 68.5 minutes, awake after sleep onset 51.5 minutes.  Arousal index 24.1.  No bedtime medication taken.  RESPIRATORY DATA:  Apnea/hypopnea index (AHI) 5.8 per hour.  A total of 30 events were scored including 1 obstructive apnea and 29 hypopneas. Events were seen in all sleep positions, especially supine and in REM, REM AHI 15.3 per hour.  There were insufficient numbers of events to qualify for CPAP titration by split protocol on this study night.  OXYGEN DATA:  Moderate snoring with oxygen desaturation to a nadir of 83% and mean oxygen saturation through the study of 94.7% on room air.  CARDIAC DATA:  Sinus rhythm with PVCs.  MOVEMENT/PARASOMNIA:  A total of 24 limb jerks were counted of which 9 were associated with arousal or awakening for periodic limb movement with arousal index of 1.7 per hour.  Bathroom x1.  IMPRESSION/RECOMMENDATIONS: 1. Very mild obstructive sleep apnea/hypopnea syndrome, AHI 5.8 per     hour.  Moderate snoring with oxygen desaturation to a nadir of 83%     and mean oxygen saturation through the study of 94.7% on room air. 2. There were  insufficient numbers of events to meet protocol     requirements for application of split CPAP titration protocol on     this study night.  Scores in this range would usually be treated     first with conservative measures including weight loss, treatment     for nasal/upper airway obstruction including allergic rhinitis, and     encouragement to sleep off flat of back. 3. 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  A total of     24 limb jerks were counted of which 9 were associated with arousal     or awakening for periodic limb movement with arousal index of 1.7     per hour.     Daegan Arizmendi D. Annamaria Boots, MD, [facility], [place], [place] Board of Sleep Medicine    CDY/MEDQ  D:  04/30/2012 09:45:56  T:  04/30/2012 10:31:35  Job:  [id number]

## 2012-07-08 ENCOUNTER — Encounter: Payer: Medicare Other | Admitting: Internal Medicine

## 2012-07-20 NOTE — Assessment & Plan Note (Addendum)
Lab Results  Component Value Date   CHOL 235* 04/01/2012   CHOL 227* 10/29/2011   CHOL 172 08/23/2011   Lab Results  Component Value Date   HDL 62 04/01/2012   HDL 62.60 10/29/2011   HDL 52 08/23/2011   Lab Results  Component Value Date   LDLCALC 157* 04/01/2012   LDLCALC 110* 08/23/2011   LDLCALC 137* 08/21/2011   Lab Results  Component Value Date   TRIG 80 04/01/2012   TRIG 144.0 10/29/2011   TRIG 52 08/23/2011   Lab Results  Component Value Date   CHOLHDL 3.8 04/01/2012   CHOLHDL 4 10/29/2011   CHOLHDL 3.3 08/23/2011   Lab Results  Component Value Date   LDLDIRECT 145.2 10/29/2011   She is due for a recheck of her lipid panel.  Her LDL today is outside her goal of <130.  She is on Crestor at max dose but compliance is a question.  We stressed compliance today as well as ensuring that she is watching her dietary intake of fats and exercise.

## 2012-07-20 NOTE — Assessment & Plan Note (Signed)
She has been bothered by daytime somnolence.  She does not feel refreshed in the AM and states that she snores and awakens at night often.  We will send her for a sleep study and follow up on the results when that is complete.

## 2012-07-20 NOTE — Assessment & Plan Note (Signed)
Lab Results  Component Value Date   NA 139 10/29/2011   K 4.2 10/29/2011   CL 107 10/29/2011   CO2 26 10/29/2011   BUN 14 10/29/2011   CREATININE 0.8 10/29/2011    BP Readings from Last 3 Encounters:  04/01/12 154/92  12/02/11 158/107  11/18/11 134/85    Assessment: Hypertension control:  mildly elevated  Progress toward goals:  unchanged Barriers to meeting goals:  no barriers identified  Plan: Hypertension treatment:  continue current medications stressed compliance with her medications and avoidance of decongestants as they could worsen her blood pressure.

## 2012-07-20 NOTE — Assessment & Plan Note (Signed)
She has been complaining of bad breath as well as a bad smell in her nostril.  She has poor dentition and it has been a while since she saw a dentist.  We will have her follow up with the dental clinic for a cleaning to see if that helps her halitosis.

## 2012-07-20 NOTE — Assessment & Plan Note (Addendum)
She has purulent drainage and has noted symptoms for at least 3 weeks.  Generally I would like to observe for a period to see if this improves before giving antibiotics but since she has had symptoms for 3 weeks already we will treat with amoxicillin today and follow up.

## 2012-08-05 ENCOUNTER — Encounter: Payer: Medicare Other | Admitting: Internal Medicine

## 2012-08-10 ENCOUNTER — Ambulatory Visit: Payer: Medicare Other | Admitting: Internal Medicine

## 2012-08-17 ENCOUNTER — Other Ambulatory Visit: Payer: Self-pay | Admitting: Physician Assistant

## 2012-08-17 NOTE — Telephone Encounter (Signed)
RX SENT IN TODAY FOR LASIX

## 2012-09-12 ENCOUNTER — Encounter: Payer: Medicare Other | Admitting: Internal Medicine

## 2012-09-19 ENCOUNTER — Encounter: Payer: Medicare Other | Admitting: Internal Medicine

## 2012-09-26 ENCOUNTER — Ambulatory Visit (INDEPENDENT_AMBULATORY_CARE_PROVIDER_SITE_OTHER): Payer: Medicare Other | Admitting: Internal Medicine

## 2012-09-26 ENCOUNTER — Encounter: Payer: Self-pay | Admitting: Internal Medicine

## 2012-09-26 VITALS — BP 133/84 | HR 86 | Temp 97.0°F | Ht 62.0 in | Wt 189.0 lb

## 2012-09-26 DIAGNOSIS — R300 Dysuria: Secondary | ICD-10-CM

## 2012-09-26 DIAGNOSIS — G4700 Insomnia, unspecified: Secondary | ICD-10-CM | POA: Diagnosis not present

## 2012-09-26 DIAGNOSIS — Z9189 Other specified personal risk factors, not elsewhere classified: Secondary | ICD-10-CM | POA: Diagnosis not present

## 2012-09-26 DIAGNOSIS — Z202 Contact with and (suspected) exposure to infections with a predominantly sexual mode of transmission: Secondary | ICD-10-CM

## 2012-09-26 DIAGNOSIS — Z23 Encounter for immunization: Secondary | ICD-10-CM

## 2012-09-26 DIAGNOSIS — E785 Hyperlipidemia, unspecified: Secondary | ICD-10-CM | POA: Diagnosis not present

## 2012-09-26 DIAGNOSIS — I10 Essential (primary) hypertension: Secondary | ICD-10-CM

## 2012-09-26 MED ORDER — ROSUVASTATIN CALCIUM 40 MG PO TABS: 40.0000 mg | ORAL_TABLET | Freq: Every day | ORAL | Status: DC

## 2012-09-26 MED ORDER — ZOLPIDEM TARTRATE 10 MG PO TABS: 10.0000 mg | ORAL_TABLET | Freq: Every evening | ORAL | Status: DC | PRN

## 2012-09-26 MED ORDER — CARVEDILOL 3.125 MG PO TABS: 3.1250 mg | ORAL_TABLET | Freq: Two times a day (BID) | ORAL | Status: DC
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
## 2012-09-26 NOTE — Patient Instructions (Signed)
We have given you a flu shot today and refilled your carvedilol, cholesterol medicine and Ambien today. We will send your urine for testing of common STDs.  If it shows an infection, we will contact you. Schedule your PAP smear once you get home and locate the number to the gynecologist office.

## 2012-12-16 DIAGNOSIS — H1030 Unspecified acute conjunctivitis, unspecified eye: Secondary | ICD-10-CM | POA: Diagnosis not present

## 2012-12-19 DIAGNOSIS — H1030 Unspecified acute conjunctivitis, unspecified eye: Secondary | ICD-10-CM | POA: Diagnosis not present

## 2012-12-26 ENCOUNTER — Ambulatory Visit: Payer: Medicare Other | Admitting: Internal Medicine

## 2012-12-30 ENCOUNTER — Ambulatory Visit: Payer: Medicare Other | Admitting: Internal Medicine

## 2013-01-02 ENCOUNTER — Ambulatory Visit (INDEPENDENT_AMBULATORY_CARE_PROVIDER_SITE_OTHER): Payer: Medicare Other | Admitting: Internal Medicine

## 2013-01-02 ENCOUNTER — Encounter: Payer: Self-pay | Admitting: Internal Medicine

## 2013-01-02 VITALS — BP 159/90 | HR 91 | Temp 97.7°F | Ht 62.0 in | Wt 191.1 lb

## 2013-01-02 DIAGNOSIS — I10 Essential (primary) hypertension: Secondary | ICD-10-CM

## 2013-01-02 DIAGNOSIS — G4700 Insomnia, unspecified: Secondary | ICD-10-CM | POA: Diagnosis not present

## 2013-01-02 DIAGNOSIS — M329 Systemic lupus erythematosus, unspecified: Secondary | ICD-10-CM

## 2013-01-02 DIAGNOSIS — M1990 Unspecified osteoarthritis, unspecified site: Secondary | ICD-10-CM | POA: Insufficient documentation

## 2013-01-02 DIAGNOSIS — M129 Arthropathy, unspecified: Secondary | ICD-10-CM | POA: Diagnosis not present

## 2013-01-02 LAB — BASIC METABOLIC PANEL WITH GFR
CO2: 25 mEq/L (ref 19–32)
Chloride: 111 mEq/L (ref 96–112)
Creat: 0.94 mg/dL (ref 0.50–1.10)
Sodium: 145 mEq/L (ref 135–145)

## 2013-01-02 MED ORDER — CARVEDILOL 3.125 MG PO TABS: 3.1250 mg | ORAL_TABLET | Freq: Two times a day (BID) | ORAL | Status: DC

## 2013-01-02 MED ORDER — NAPROXEN 500 MG PO TABS: 500.0000 mg | ORAL_TABLET | Freq: Two times a day (BID) | ORAL | Status: DC

## 2013-01-02 MED ORDER — FUROSEMIDE 20 MG PO TABS: ORAL_TABLET | ORAL | Status: DC

## 2013-01-02 MED ORDER — ZOLPIDEM TARTRATE 10 MG PO TABS: 10.0000 mg | ORAL_TABLET | Freq: Every evening | ORAL | Status: DC | PRN

## 2013-01-02 NOTE — Progress Notes (Signed)
Patient ID: Elizabeth [name], female   DOB: Apr 05, 1956, 57 y.o.   MRN: [id number] HPI: [name] is a 57 yo woman with PMH of substance abuse, fibromyalgia with multiple joint pain presents today for knee joints pain bilatreally, sharp pain in legs sometimes. +hands hurting, right finger get stuck.  Left wrist hurting.  Pain in worst with the changes in weather.  She states that she took one of her sister pain by mouth which really relieve the pain and would like first to prescribe her some. She tried ibuprofen without relief. She has hx of lupus per patient report. Has +ANA, centromere, 1:80.  Has not seen rheumatology before.  Chart review shows that she was on Plaquenil before unclear why it was stopped. She also wants BP refills on coreg and Lasix and ambien. She has multiple boils on her body but it comes and goes. Still has one in the left inguinal area that she would like for me to check out.  Review of system: As per history of present illness  Physical examination: General: alert, well-developed, and cooperative to examination.  Lungs: normal respiratory effort, no accessory muscle use, normal breath sounds, no crackles, and no wheezes. Heart: normal rate, regular rhythm, no murmur, no gallop, and no rub.  Abdomen: soft, non-tender, normal bowel sounds, no distention, no guarding, no rebound tenderness Neurologic: nonfocal EXT: multiple point tenderness. Limited ROM of knee joints due to pain.   Skin: turgor normal and +resolved boiled on left inguinal area without any drainage Psych: appropriate

## 2013-01-02 NOTE — Patient Instructions (Addendum)
Start taking Naproxen 567m one tablet daily x 2 weeks Will get labs today and I will call you with abnormal results Will schedule for rheumatology Follow up with your primary doctor as needed

## 2013-01-02 NOTE — Assessment & Plan Note (Addendum)
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 She does have a right shoulder AC joint degeneration, C-spine spondylosis in 2012. MRI in January of 2013 show chronic disc degeneration and spondylosis of C4-C5, C5-C6. Mild spinal stenosis at C5-6 due to spurring. She did have +ANA, centromere, and ANA titer: 1:80, RF <20, Sed rate 29, CK total 2174.  Was on plaquenil before, unclear why it was stopped.  I would avoid narcotics in this patient as she has hx of substance abuse and narcotic seeking behavior. If she does any narcotics, I suggested that she schedule an appointment with her PCP to discuss.  -Start Naproxen 583m bid -Check BMP to make sure her kidney function is ok -Refer to rheumatology for further evaluation and treatment -may need Knee xrays in the future

## 2013-01-17 ENCOUNTER — Encounter: Payer: Self-pay | Admitting: Licensed Clinical Social Worker

## 2013-01-24 ENCOUNTER — Encounter: Payer: Medicare Other | Admitting: Internal Medicine

## 2013-01-27 ENCOUNTER — Encounter: Payer: Medicare Other | Admitting: Internal Medicine

## 2013-01-27 NOTE — Progress Notes (Signed)
  This encounter was created in error - please disregard.

## 2013-02-20 ENCOUNTER — Encounter: Payer: Self-pay | Admitting: Licensed Clinical Social Worker

## 2013-02-20 ENCOUNTER — Telehealth: Payer: Self-pay | Admitting: Licensed Clinical Social Worker

## 2013-02-20 NOTE — Telephone Encounter (Signed)
Elizabeth [name]' EMR still reflects [facility] as pt's PCP.  CSW placed call to pt to confirm pt desires to remain pt here at [facility].  Pt states she has never heard of [facility] and was under the impression someone was going to change the PCP for her.  CSW informed Elizabeth [name] letter was sent requesting her signature to change.  Pt states she does not remember where the letter is.  CSW will attempt to change PCP from [facility] to [facility], and will also send letter out to pt requesting signature change.

## 2013-03-28 ENCOUNTER — Encounter: Payer: Self-pay | Admitting: Licensed Clinical Social Worker

## 2013-04-13 ENCOUNTER — Ambulatory Visit: Payer: Medicare Other | Admitting: Internal Medicine

## 2013-04-19 ENCOUNTER — Ambulatory Visit: Payer: Medicare Other | Admitting: Internal Medicine

## 2013-04-26 ENCOUNTER — Ambulatory Visit: Payer: Medicare Other | Admitting: Internal Medicine

## 2013-05-12 ENCOUNTER — Ambulatory Visit (HOSPITAL_COMMUNITY)
Admission: RE | Admit: 2013-05-12 | Discharge: 2013-05-12 | Disposition: A | Discharge status: Home / Self Care | Payer: Medicare Other | Source: Ambulatory Visit | Attending: Internal Medicine | Admitting: Internal Medicine

## 2013-05-12 ENCOUNTER — Emergency Department (HOSPITAL_COMMUNITY): Payer: Medicare Other

## 2013-05-12 ENCOUNTER — Encounter: Payer: Self-pay | Admitting: Internal Medicine

## 2013-05-12 ENCOUNTER — Observation Stay (HOSPITAL_COMMUNITY): Admission: AD | Admit: 2013-05-12 | Payer: Medicare Other | Source: Ambulatory Visit | Admitting: Internal Medicine

## 2013-05-12 ENCOUNTER — Ambulatory Visit (INDEPENDENT_AMBULATORY_CARE_PROVIDER_SITE_OTHER): Payer: Medicare Other | Admitting: Internal Medicine

## 2013-05-12 ENCOUNTER — Encounter (HOSPITAL_COMMUNITY): Payer: Self-pay | Admitting: Family Medicine

## 2013-05-12 ENCOUNTER — Other Ambulatory Visit: Payer: Self-pay

## 2013-05-12 ENCOUNTER — Emergency Department (HOSPITAL_COMMUNITY)
Admission: EM | Admit: 2013-05-12 | Discharge: 2013-05-12 | Disposition: A | Discharge status: Home / Self Care | Payer: Medicare Other | Attending: Emergency Medicine | Admitting: Emergency Medicine

## 2013-05-12 VITALS — BP 168/101 | HR 77 | Temp 97.0°F | Ht 61.5 in | Wt 189.5 lb

## 2013-05-12 DIAGNOSIS — R110 Nausea: Secondary | ICD-10-CM | POA: Insufficient documentation

## 2013-05-12 DIAGNOSIS — Z8719 Personal history of other diseases of the digestive system: Secondary | ICD-10-CM | POA: Insufficient documentation

## 2013-05-12 DIAGNOSIS — F17200 Nicotine dependence, unspecified, uncomplicated: Secondary | ICD-10-CM | POA: Diagnosis not present

## 2013-05-12 DIAGNOSIS — I252 Old myocardial infarction: Secondary | ICD-10-CM | POA: Diagnosis not present

## 2013-05-12 DIAGNOSIS — Z862 Personal history of diseases of the blood and blood-forming organs and certain disorders involving the immune mechanism: Secondary | ICD-10-CM | POA: Insufficient documentation

## 2013-05-12 DIAGNOSIS — I10 Essential (primary) hypertension: Secondary | ICD-10-CM | POA: Insufficient documentation

## 2013-05-12 DIAGNOSIS — Z79899 Other long term (current) drug therapy: Secondary | ICD-10-CM | POA: Diagnosis not present

## 2013-05-12 DIAGNOSIS — Z7982 Long term (current) use of aspirin: Secondary | ICD-10-CM | POA: Diagnosis not present

## 2013-05-12 DIAGNOSIS — Z8659 Personal history of other mental and behavioral disorders: Secondary | ICD-10-CM | POA: Insufficient documentation

## 2013-05-12 DIAGNOSIS — E785 Hyperlipidemia, unspecified: Secondary | ICD-10-CM | POA: Diagnosis not present

## 2013-05-12 DIAGNOSIS — R079 Chest pain, unspecified: Secondary | ICD-10-CM | POA: Insufficient documentation

## 2013-05-12 DIAGNOSIS — G4700 Insomnia, unspecified: Secondary | ICD-10-CM | POA: Insufficient documentation

## 2013-05-12 DIAGNOSIS — I2510 Atherosclerotic heart disease of native coronary artery without angina pectoris: Secondary | ICD-10-CM | POA: Diagnosis not present

## 2013-05-12 DIAGNOSIS — J40 Bronchitis, not specified as acute or chronic: Secondary | ICD-10-CM | POA: Diagnosis not present

## 2013-05-12 DIAGNOSIS — Z8739 Personal history of other diseases of the musculoskeletal system and connective tissue: Secondary | ICD-10-CM | POA: Insufficient documentation

## 2013-05-12 LAB — BASIC METABOLIC PANEL WITH GFR
Calcium: 9.2 mg/dL (ref 8.4–10.5)
GFR calc non Af Amer: 72 mL/min — ABNORMAL LOW (ref >90)
Sodium: 141 meq/L (ref 135–145)

## 2013-05-12 LAB — CBC
HCT: 41.2 % (ref 36.0–46.0)
Hemoglobin: 13.7 g/dL (ref 12.0–15.0)
MCH: 28.9 pg (ref 26.0–34.0)
MCHC: 33.3 g/dL (ref 30.0–36.0)
MCV: 86.9 fL (ref 78.0–100.0)
Platelets: 240 K/uL (ref 150–400)
RBC: 4.74 MIL/uL (ref 3.87–5.11)
RDW: 14.8 % (ref 11.5–15.5)
WBC: 5.8 10*3/uL (ref 4.0–10.5)

## 2013-05-12 LAB — BASIC METABOLIC PANEL
BUN: 17 mg/dL (ref 6–23)
CO2: 24 mEq/L (ref 19–32)
Chloride: 104 mEq/L (ref 96–112)
Creatinine, Ser: 0.89 mg/dL (ref 0.50–1.10)
GFR calc Af Amer: 83 mL/min — ABNORMAL LOW (ref >90)
Glucose, Bld: 100 mg/dL — ABNORMAL HIGH (ref 70–99)
Potassium: 4 mEq/L (ref 3.5–5.1)

## 2013-05-12 LAB — TROPONIN I: Troponin I: <0.3 ng/mL (ref <0.30)

## 2013-05-12 LAB — POCT I-STAT TROPONIN I: Troponin i, poc: 0.04 ng/mL (ref 0.00–0.08)

## 2013-05-12 NOTE — Progress Notes (Signed)
Subjective:   Patient ID: Elizabeth [name] female   DOB: 09-Jan-1956 57 y.o.   MRN: [id number] Chief complaint: uncontrolled HTN and Chest pain HPI: ElizabethAnya P [name] is a 57 y.o. woman with PMH of hypertension, hyperlipidemia, CAD with NSTEMI/PCI, lupus, fibromyalgia, and depression, who presents to clinic for followup visit.  # Chest pain   Patient reports intermittent chest pain for past few days. She describes sudden onset chest pain, located on left sided chest area, sharp/dull pain, 7-9/10, no radiation, last 5-10 minutes, resolves spontaneously. She states that resting and exercise can both precipitate chest pain. Associated symptoms include chest pressure, and palpitation. Of note, she has a history of Cocaine abuse, denies use of Cocaine for past 7-8 months. She has chest pain now.   # Uncontrolled HTN Patient reports that she has been out of her medications for over two months. She states that she ran out of refills and did not notify the clinic. She does not have a BP monitoring but feels that her BP has been high since she usually had headache and tiredness.    # Polyuria She also reports thirst, polydipsia, polyuria and B/L feet burning sensation for months. Endorses nausea and decreased appetite.  Denies dysuria, hematuria or flank pain.  No significant weight loss. Denies blurry vision. She did not see any provider for it.    Review of Systems:  Constitutional:  Denies fever, chills, diaphoresis, appetite change and positive for fatigue.   HEENT:  Denies congestion, sore throat, rhinorrhea, sneezing, mouth sores, trouble swallowing, neck pain   Respiratory:  Denies SOB, DOE, cough, and wheezing.   Cardiovascular:  Denies palpitations and leg swelling.  Positive for chest pain   Gastrointestinal:  Denies nausea, vomiting, abdominal pain, diarrhea, constipation, blood in stool and abdominal distention.   Genitourinary:  Denies dysuria, urgency, frequency, hematuria,  flank pain and difficulty urinating.  Positive for thirst, polydipsia, polyuria in the bilateral feet burning sensation   Musculoskeletal:  Denies myalgias, back pain, joint swelling, arthralgias and gait problem.   Skin:  Denies pallor, rash and wound.   Neurological:  Denies dizziness, seizures, syncope, weakness, light-headedness, numbness and positive for headaches.    .   Past Medical History  Diagnosis Date  . Hypertension   . HLD (hyperlipidemia)     Chol = 235, LDL = 156 (08/2010)  . Substance abuse     cocaine   . Depression   . Lupus 2009    ANA + 10/2008, repeat ANA + (05/2009), on that visit 05/2009 following labs obtained RF <20, CRP <0.4, ANA titers 1:80,   . CAD (coronary artery disease)     NSTEMI 08/2011:  LHC 08/21/11: mLAD 60-70%, pCFX occluded, dRCA chronic occlusion with L-R collats, EF 40-45%, inf AK.  PCI:  BMS to CFX.  Marland Kitchen Pulmonary nodules 05/2008    noted on CXR and CT 05/2008, repeat CT 10/2008 - Stable small bilateral pulmonary nodules measuring up to 6 m m  . Insomnia   . GERD (gastroesophageal reflux disease)   . Fibroids   . History of microcytic hypochromic anemia   . Fibromyalgia    Current Outpatient Prescriptions  Medication Sig Dispense Refill  . aspirin 81 MG EC tablet Take 2 tablets (162 mg total) by mouth daily.  30 tablet  5  . carvedilol (COREG) 3.125 MG tablet Take 1 tablet (3.125 mg total) by mouth 2 (two) times daily.  60 tablet  3  . furosemide (LASIX) 20 MG tablet  TAKE ONE TABLET BY MOUTH ONE TIME DAILY  30 tablet  3  . lisinopril (PRINIVIL,ZESTRIL) 5 MG tablet Take 1 tablet (5 mg total) by mouth daily.  30 tablet  11  . naproxen (NAPROSYN) 500 MG tablet Take 1 tablet (500 mg total) by mouth 2 (two) times daily with a meal.  60 tablet  2  . nitroGLYCERIN (NITROSTAT) 0.4 MG SL tablet Place 0.4 mg under the tongue every 5 (five) minutes as needed.        . potassium chloride (K-DUR) 10 MEQ tablet Take 1 tablet (10 mEq total) by mouth daily.   30 tablet  3  . rosuvastatin (CRESTOR) 40 MG tablet Take 1 tablet (40 mg total) by mouth daily.  30 tablet  6  . zolpidem (AMBIEN) 10 MG tablet Take 1 tablet (10 mg total) by mouth at bedtime as needed.  30 tablet  1   No current facility-administered medications for this visit.   Family History  Problem Relation Age of Onset  . Heart disease Mother   . Cervical cancer Sister   . Prostate cancer Brother   . Colon cancer Neg Hx    History   Social History  . Marital Status: Divorced    Spouse Name: N/A    Number of Children: N/A  . Years of Education: N/A   Social History Main Topics  . Smoking status: Current Every Day Smoker -- 0.30 packs/day    Types: Cigarettes  . Smokeless tobacco: None     Comment: 3 cigs per day- given QUIT line info  . Alcohol Use: Yes     Comment: beer 1-2 per day  . Drug Use: No  . Sexually Active: Not Currently   Other Topics Concern  . None   Social History Narrative  . None   Review of system See HPI  Objective:  Physical Exam: Filed Vitals:   05/12/13 1430  BP: 168/101  Pulse: 77  Temp: 97 F (36.1 C)  TempSrc: Oral  Height: 5' 1.5" (1.562 m)  Weight: 189 lb 8 oz (85.957 kg)  SpO2: 98%   General: Anxious lady  Head: normocephalic and atraumatic.  Eyes: vision grossly intact, pupils equal, pupils round, pupils reactive to light, no injection and anicteric.  Mouth: pharynx pink and moist, no erythema, and no exudates.  Neck: supple, full ROM, no thyromegaly, no JVD, and no carotid bruits.  Lungs: normal respiratory effort, no accessory muscle use, normal breath sounds, no crackles, and no wheezes. Heart: normal rate, regular rhythm, no murmur, no gallop, and no rub.  Abdomen: soft, non-tender, normal bowel sounds, no distention, no guarding, no rebound tenderness, no hepatomegaly, and no splenomegaly.  Msk: no joint swelling, no joint warmth, and no redness over joints.  Pulses: 2+ DP/PT pulses bilaterally Extremities: No  cyanosis, clubbing, edema Neurologic: alert & oriented X3, cranial nerves II-XII intact, strength normal in all extremities, sensation intact to light touch, and gait normal.  Skin: turgor normal and no rashes.  Psych: Oriented X3, memory intact for recent and remote, normally interactive, good eye contact, not anxious appearing, and not depressed appearing.   Assessment & Plan:

## 2013-05-12 NOTE — Patient Instructions (Addendum)
1. will need to be admitted to hospital for Unstable angina workup, but the patient declines to be admitted to hospital now.  She prefers to go home and run some errands before coming back to ED.  Please see my discussion during the office visit.

## 2013-05-12 NOTE — ED Provider Notes (Signed)
History    CSN: [id number] Arrival date & time 05/12/13  1715  First MD Initiated Contact with Patient 05/12/13 2001     Chief Complaint  Patient presents with  . Chest Pain   (Consider location/radiation/quality/duration/timing/severity/associated sxs/prior Treatment) HPI Comments: Patient is a 57 year old female with a history of hypertension, hyperlipidemia, tobacco use, coronary artery disease, and in NSTEMI in 08/2011 who presents for left-sided chest pain with onset this morning. Patient states that pain is sharp in nature, nonradiating, and without aggravating or alleviating factors. Patient states that pain has resolved and that she has been asymptomatic for at least 2 hours. Patient is to of some associated nausea. She denies associated fevers, vision changes, jaw pain, shortness of breath, vomiting or abdominal pain, numbness or tingling in her extremities, and extremity weakness. Patient with stenting of the proximal LCA in 2010 after NSTEMI. Endorses of hx of fatal MI in mother at age 52. Patient with hx of cocaine use, but denies use in recent years.  Patient is a 57 y.o. female presenting with chest pain. The history is provided by the patient. No language interpreter was used.  Chest Pain Associated symptoms: nausea    Past Medical History  Diagnosis Date  . Hypertension   . HLD (hyperlipidemia)     Chol = 235, LDL = 156 (08/2010)  . Substance abuse     cocaine   . Depression   . Lupus 2009    ANA + 10/2008, repeat ANA + (05/2009), on that visit 05/2009 following labs obtained RF <20, CRP <0.4, ANA titers 1:80,   . CAD (coronary artery disease)     NSTEMI 08/2011:  LHC 08/21/11: mLAD 60-70%, pCFX occluded, dRCA chronic occlusion with L-R collats, EF 40-45%, inf AK.  PCI:  BMS to CFX.  Marland Kitchen Pulmonary nodules 05/2008    noted on CXR and CT 05/2008, repeat CT 10/2008 - Stable small bilateral pulmonary nodules measuring up to 6 m m  . Insomnia   . GERD (gastroesophageal  reflux disease)   . Fibroids   . History of microcytic hypochromic anemia   . Fibromyalgia    Past Surgical History  Procedure Laterality Date  . Abdominal hysterectomy  05/2003   Family History  Problem Relation Age of Onset  . Heart disease Mother   . Cervical cancer Sister   . Prostate cancer Brother   . Colon cancer Neg Hx    History  Substance Use Topics  . Smoking status: Current Every Day Smoker -- 0.30 packs/day    Types: Cigarettes  . Smokeless tobacco: Not on file     Comment: 3 cigs per day- given QUIT line info  . Alcohol Use: Yes     Comment: beer 1-2 per day   OB History   Grav Para Term Preterm Abortions TAB SAB Ect Mult Living                 Review of Systems  Cardiovascular: Positive for chest pain.  Gastrointestinal: Positive for nausea.  All other systems reviewed and are negative.    Allergies  Review of patient's allergies indicates no known allergies.  Home Medications   Current Outpatient Rx  Name  Route  Sig  Dispense  Refill  . aspirin EC 81 MG tablet   Oral   Take 81 mg by mouth daily.         . carvedilol (COREG) 3.125 MG tablet   Oral   Take 1 tablet (3.125 mg total)  by mouth 2 (two) times daily.   60 tablet   3   . furosemide (LASIX) 20 MG tablet   Oral   Take 20 mg by mouth daily.         . rosuvastatin (CRESTOR) 40 MG tablet   Oral   Take 1 tablet (40 mg total) by mouth daily.   30 tablet   6   . zolpidem (AMBIEN) 10 MG tablet   Oral   Take 10 mg by mouth at bedtime as needed for sleep.          BP 172/112  Pulse 86  Temp(Src) 99.4 F (37.4 C) (Oral)  Resp 16  SpO2 94%  LMP 12/30/1994  Physical Exam  Nursing note and vitals reviewed. Constitutional: She is oriented to person, place, and time. She appears well-developed and well-nourished. No distress.  HENT:  Head: Normocephalic and atraumatic.  Mouth/Throat: Oropharynx is clear and moist. No oropharyngeal exudate.  Eyes: Conjunctivae and EOM are  normal. Pupils are equal, round, and reactive to light. No scleral icterus.  Neck: Normal range of motion. Neck supple.  Cardiovascular: Normal rate, regular rhythm and normal heart sounds.   Pulmonary/Chest: Effort normal and breath sounds normal. No respiratory distress. She has no wheezes. She has no rales.  Abdominal: Soft. She exhibits no distension. There is no tenderness. There is no rebound and no guarding.  Musculoskeletal: Normal range of motion. She exhibits no edema.  Lymphadenopathy:    She has no cervical adenopathy.  Neurological: She is alert and oriented to person, place, and time.  Skin: Skin is warm and dry. No rash noted. She is not diaphoretic. No erythema. No pallor.  Psychiatric: She has a normal mood and affect. Her behavior is normal.    ED Course  Procedures (including critical care time) Labs Reviewed  BASIC METABOLIC PANEL - Abnormal; Notable for the following:    Glucose, Bld 100 (*)    GFR calc non Af Amer 72 (*)    GFR calc Af Amer 83 (*)    All other components within normal limits  CBC  POCT I-STAT TROPONIN I  POCT I-STAT TROPONIN I   Dg Chest 2 View  05/12/2013   *RADIOLOGY REPORT*  Clinical Data: Chest pain, history of hypertension and CAD  CHEST - 2 VIEW  Comparison: 11/18/2011; 08/22/2011; chest CT - 11/24/2011  Findings:  Grossly unchanged borderline enlarged cardiac silhouette.  Mild tortuosity of the thoracic aorta.  The lungs appear mildly hyperexpanded with flattening of bilateral hemidiaphragms mild diffuse thickening of the pulmonary interstitium.  No focal airspace opacities.  Known pulmonary nodules are not well depicted on this examination.  No pleural effusion or pneumothorax.  No definite evidence of edema.  Unchanged bones.  IMPRESSION: 1.  Hyperexpanded lungs and bronchitic change without acute cardiopulmonary disease. 2.  Known pulmonary nodules are not well depicted on this examination.   Original Report Authenticated By: Jake Seats, MD     Date: 05/12/2013  Rate: 78  Rhythm: normal sinus rhythm  QRS Axis: left  Intervals: QT prolonged (QTc 483)  ST/T Wave abnormalities: nonspecific ST/T changes  Conduction Disutrbances:left anterior fascicular block  Narrative Interpretation: NSR with QT prolongation and NSST changes; no STEMI and c/w prior  Old EKG Reviewed: unchanged from 10/29/2011 I have personally reviewed and interpreted this EKG.  1. Chest pain     MDM  Patient with extensive cardiac hx and risk factors, including NSTEMI in setting of cocaine use, presents for  CP with onset this AM. Physical exam without significant findings. CP not reproducible on palpation. Labs c/w prior. CXR without evidence of PNA, PTX, or pleural effusion by my interpretation; stable from prior. EKG also unchanged from prior and troponin x 2 wnl. Patient asymptomatic at this time and states she has been for a few hours.   Have discussed results with patient who verbalizes understanding. Have also spoken with patient regarding admission for observation and further work up as needed in light of risk factors, hx of ACS, and FHx ACS in mother. Patient states that she does not want to be admitted to the hospital for further work up and is adamant on going home. Have stated that premature discharge may result in adverse outcomes or death which she states she understands. Still insistent on d/c home. Given reassuring work up in ED and the fact that she has remained asymptomatic will discharge with cardiology follow up. Strict return precautions given. Patient verbalizes comfort and understanding of plan with no unaddressed concerns.     Antonietta Breach, PA-C 05/17/13 [phone]

## 2013-05-12 NOTE — ED Notes (Signed)
Antonietta Breach PA at bedside.

## 2013-05-12 NOTE — ED Notes (Signed)
Per pt sts intermittent chest pain x a few days. sts some nausea. sts sharp on the left side. sts more with exertion

## 2013-05-12 NOTE — ED Notes (Signed)
Pt at X-ray

## 2013-05-12 NOTE — Assessment & Plan Note (Addendum)
Patient presents with intermittent chest pain for past few days.  She has a history of nonobstrucitive by cardiac cath in 2005 and cocaine abuse. She was admitted for NSTEMI in 2012 when she has LHC revealing mLAD 60-70%, pCFX occluded, dRCA chronic occlusion with L-R collaterals. EF 40-50%, inf AK. PCI: BMS to CFX.   She is followed by [facility], but she is noncompliant with her medical treatment nor her appointments.  She has not taken her pills for over two months.   - Stat EKG showing ST-T wave abnormality and lateral ischemia, which is an new change comparing to previous EKG. - Stat troponin pending - Patient will need to be admitted to hospital for unstable angina workup.   - However, she insists on going home to run some errands before coming back to the hospital.  I have discussed with her about the importance of hospital admission and potential consequences/compliactions if she declined to be admitted and goes home, including heart attack, and sudden cardiac death.  Patient states that she understands all of consequences and complications, but she insists on going home to run some errands. - Patient is instructed to go to the emergency room when she is able to come back to the hospital.  Patient understands.  -Above discussion communicated with attending physician Dr. Larey Dresser.  Additionally,She will also need to resume all of her medications, have HGB A1C checked for DM given her symptoms of thirst, polydipsia, polyuria and the bilateral feet burning sensation.

## 2013-05-13 NOTE — Progress Notes (Signed)
Case discussed with Dr. Nicoletta Dress soon after the resident saw the patient. We reviewed the resident's history and exam and pertinent patient test results. I agree with the assessment, diagnosis, and plan of care documented in the resident's note.

## 2013-05-15 ENCOUNTER — Other Ambulatory Visit: Payer: Self-pay | Admitting: *Deleted

## 2013-05-15 DIAGNOSIS — E785 Hyperlipidemia, unspecified: Secondary | ICD-10-CM

## 2013-05-15 DIAGNOSIS — I10 Essential (primary) hypertension: Secondary | ICD-10-CM

## 2013-05-15 MED ORDER — ZOLPIDEM TARTRATE 10 MG PO TABS: 10.0000 mg | ORAL_TABLET | Freq: Every evening | ORAL | Status: DC | PRN

## 2013-05-15 MED ORDER — ROSUVASTATIN CALCIUM 40 MG PO TABS: 40.0000 mg | ORAL_TABLET | Freq: Every day | ORAL | Status: DC

## 2013-05-15 MED ORDER — ASPIRIN EC 81 MG PO TBEC: 81.0000 mg | DELAYED_RELEASE_TABLET | Freq: Every day | ORAL | Status: DC

## 2013-05-15 MED ORDER — FUROSEMIDE 20 MG PO TABS: 20.0000 mg | ORAL_TABLET | Freq: Every day | ORAL | Status: DC

## 2013-05-15 MED ORDER — CARVEDILOL 3.125 MG PO TABS: 3.1250 mg | ORAL_TABLET | Freq: Two times a day (BID) | ORAL | Status: DC

## 2013-05-16 NOTE — Telephone Encounter (Signed)
Rx called in to pharmacy.

## 2013-05-16 NOTE — Telephone Encounter (Signed)
Message sent to front desk pool regarding appt per Dr Nicoletta Dress.

## 2013-05-17 NOTE — ED Provider Notes (Signed)
Medical screening examination/treatment/procedure(s) were performed by non-physician practitioner and as supervising physician I was immediately available for consultation/collaboration.   Saddie Benders. Akito Boomhower, MD 05/17/13 1231

## 2013-06-13 ENCOUNTER — Ambulatory Visit (INDEPENDENT_AMBULATORY_CARE_PROVIDER_SITE_OTHER): Payer: Medicare Other | Admitting: Internal Medicine

## 2013-06-13 ENCOUNTER — Encounter: Payer: Self-pay | Admitting: Internal Medicine

## 2013-06-13 VITALS — BP 142/88 | HR 68 | Temp 97.0°F | Ht 61.5 in | Wt 189.8 lb

## 2013-06-13 DIAGNOSIS — M329 Systemic lupus erythematosus, unspecified: Secondary | ICD-10-CM | POA: Diagnosis not present

## 2013-06-13 DIAGNOSIS — G4700 Insomnia, unspecified: Secondary | ICD-10-CM | POA: Diagnosis not present

## 2013-06-13 DIAGNOSIS — E785 Hyperlipidemia, unspecified: Secondary | ICD-10-CM

## 2013-06-13 DIAGNOSIS — R12 Heartburn: Secondary | ICD-10-CM | POA: Diagnosis not present

## 2013-06-13 DIAGNOSIS — I10 Essential (primary) hypertension: Secondary | ICD-10-CM

## 2013-06-13 DIAGNOSIS — Z299 Encounter for prophylactic measures, unspecified: Secondary | ICD-10-CM

## 2013-06-13 MED ORDER — ZOLPIDEM TARTRATE 10 MG PO TABS: 10.0000 mg | ORAL_TABLET | Freq: Every evening | ORAL | Status: DC | PRN

## 2013-06-13 MED ORDER — CARVEDILOL 6.25 MG PO TABS: 6.2500 mg | ORAL_TABLET | Freq: Two times a day (BID) | ORAL | Status: DC

## 2013-06-13 MED ORDER — OMEPRAZOLE MAGNESIUM 20 MG PO TBEC: 20.0000 mg | DELAYED_RELEASE_TABLET | Freq: Every day | ORAL | Status: DC

## 2013-06-13 NOTE — Progress Notes (Signed)
Subjective:     Patient ID: Elizabeth [name], female   DOB: [birth year], 56 y.o.   MRN: [id number]  HPI  Elizabeth [name] is a very pleasant and cheerful african american lady with history of SLE, hyperlipidemia, hypertension, and NSTEMI in 2012 with cath done which showed LAD with 60-70% occlusion. Elizabeth [name] comes in for her hypertension follow up. She has been taking coreg 3.125 BID and furosemide 20 mg and her BP is better controlled. She denies any headaches, visual changes or chest pains. She denies SOB, palpitations. She admits of insomnia and would like to have a refill of zolpidem.   SLE: She has had SLE for a very long time, she gets flares from time to time. She has mild-moderate arthralgias and myalgias on a daily basis. She currently is not having a flare and does not have any skin changes. When she flares up, she usually has the acute malar rash, however she has had extremity rashes before. She does not have any medications that she is taking right now for SLE.  She offers some non-specific GI complaints. She has nausea and heartburn sometimes after foor, she denies any abdominal pain. Most often, she keeps repeating -  I just feel bad after taking food. She is not able to explain to me what she means by "bad". She has no other complaints. She denies constipation, diarrhea, abdominal pain.   She  has a past medical history of Hypertension; HLD (hyperlipidemia); Substance abuse; Depression; Lupus (2009); CAD (coronary artery disease); Pulmonary nodules (05/2008); Insomnia; GERD (gastroesophageal reflux disease); Fibroids; History of microcytic hypochromic anemia; and Fibromyalgia.  Review of Systems As per HPI    Objective:   Physical Exam General: No acute distress.  HEENT: PERRL, EOMI.   CV: S1S2 RRR, no murmur Lungs: Bilateral Vesicular breath sounds.  Abdomen: Soft, non-tender, benign Pedal Edema: Absent Pedal pulses present.  Neuro: Alert and oriented times 3. No gross deficits  seen.     Assessment:     Hypertension: Mildly elevated but better than before. I have increased her Coreg to 6.25 BID given her NSTEMI history and SLE. She is on Aspirin 81 and I agree with that.    BP Readings from Last 3 Encounters:  06/13/13 142/88  05/12/13 180/116  05/12/13 168/101   Hyperlipidemia - Continue Crestor. Counseled regarding side effects and myalgias due to statins. She voiced understanding and will monitor for any change in her present symptoms of myalgias and arthralgias that she has from time to time.   SLE: We discussed about the need for specialist follow up in the setting of SLE. She agrees to follow up with rheumatology at this time.   Possible GERD: Heartburn and Nausea likely secondary to GERD. Will give her a trial of PPI. She does not remember where her colonoscopy was done last time, she says it was probably 5 years ago with some polyps removed at that time. I have ordered a screening colonoscopy for her at this time given that I have no way of getting her previous records. She agrees with that.      Preventive: Last mammogram in 2012. We will schedule one this time. Complete Abdominal hysterectomy with cervix removed per patient, so she does not need any PAP smears at present.   RTC 3 months.

## 2013-06-13 NOTE — Patient Instructions (Addendum)
[name],   Today we discussed your high blood pressure. You have good control over it now, but we want to make it better. So I have increased COREG to 6.27m twice a day.  If you feel any dizziness, shortness of breath, feeling of passing or any other unusual symptom, then please call the clinic back or go to ER.  We have booked you an appointment to an SLE doctor (Rhenumatologist), and a stomach doctor for colonoscopy (Physiological scientist.  Since you do not want to do blood work today, we will do it next visit (cholesterol).    We will see each other in 3 months!  Thanks, SMadilyn FiremanMD MPH 06/13/2013 11:22 AM

## 2013-06-28 ENCOUNTER — Other Ambulatory Visit: Payer: Self-pay | Admitting: Gastroenterology

## 2013-06-28 DIAGNOSIS — Z86010 Personal history of colonic polyps: Secondary | ICD-10-CM | POA: Diagnosis not present

## 2013-06-28 DIAGNOSIS — R197 Diarrhea, unspecified: Secondary | ICD-10-CM | POA: Diagnosis not present

## 2013-06-28 DIAGNOSIS — I2510 Atherosclerotic heart disease of native coronary artery without angina pectoris: Secondary | ICD-10-CM | POA: Diagnosis not present

## 2013-07-14 ENCOUNTER — Encounter (HOSPITAL_COMMUNITY)
Admission: RE | Disposition: A | Discharge status: Home / Self Care | Payer: Self-pay | Source: Ambulatory Visit | Attending: Gastroenterology

## 2013-07-14 ENCOUNTER — Ambulatory Visit (HOSPITAL_COMMUNITY)
Admission: RE | Admit: 2013-07-14 | Discharge: 2013-07-14 | Disposition: A | Discharge status: Home / Self Care | Payer: Medicare Other | Source: Ambulatory Visit | Attending: Gastroenterology | Admitting: Gastroenterology

## 2013-07-14 ENCOUNTER — Encounter (HOSPITAL_COMMUNITY): Payer: Self-pay | Admitting: *Deleted

## 2013-07-14 DIAGNOSIS — Z1211 Encounter for screening for malignant neoplasm of colon: Secondary | ICD-10-CM | POA: Insufficient documentation

## 2013-07-14 DIAGNOSIS — K644 Residual hemorrhoidal skin tags: Secondary | ICD-10-CM | POA: Insufficient documentation

## 2013-07-14 DIAGNOSIS — I2510 Atherosclerotic heart disease of native coronary artery without angina pectoris: Secondary | ICD-10-CM | POA: Insufficient documentation

## 2013-07-14 DIAGNOSIS — Z860100 Personal history of colon polyps, unspecified: Secondary | ICD-10-CM | POA: Insufficient documentation

## 2013-07-14 DIAGNOSIS — F329 Major depressive disorder, single episode, unspecified: Secondary | ICD-10-CM | POA: Insufficient documentation

## 2013-07-14 DIAGNOSIS — F3289 Other specified depressive episodes: Secondary | ICD-10-CM | POA: Insufficient documentation

## 2013-07-14 DIAGNOSIS — G4700 Insomnia, unspecified: Secondary | ICD-10-CM | POA: Insufficient documentation

## 2013-07-14 DIAGNOSIS — K649 Unspecified hemorrhoids: Secondary | ICD-10-CM | POA: Diagnosis not present

## 2013-07-14 DIAGNOSIS — IMO0001 Reserved for inherently not codable concepts without codable children: Secondary | ICD-10-CM | POA: Insufficient documentation

## 2013-07-14 DIAGNOSIS — K648 Other hemorrhoids: Secondary | ICD-10-CM | POA: Insufficient documentation

## 2013-07-14 DIAGNOSIS — K219 Gastro-esophageal reflux disease without esophagitis: Secondary | ICD-10-CM | POA: Insufficient documentation

## 2013-07-14 DIAGNOSIS — F1411 Cocaine abuse, in remission: Secondary | ICD-10-CM | POA: Insufficient documentation

## 2013-07-14 DIAGNOSIS — I252 Old myocardial infarction: Secondary | ICD-10-CM | POA: Insufficient documentation

## 2013-07-14 DIAGNOSIS — M329 Systemic lupus erythematosus, unspecified: Secondary | ICD-10-CM | POA: Insufficient documentation

## 2013-07-14 DIAGNOSIS — K5730 Diverticulosis of large intestine without perforation or abscess without bleeding: Secondary | ICD-10-CM | POA: Insufficient documentation

## 2013-07-14 DIAGNOSIS — I10 Essential (primary) hypertension: Secondary | ICD-10-CM | POA: Insufficient documentation

## 2013-07-14 DIAGNOSIS — F17200 Nicotine dependence, unspecified, uncomplicated: Secondary | ICD-10-CM | POA: Insufficient documentation

## 2013-07-14 DIAGNOSIS — E785 Hyperlipidemia, unspecified: Secondary | ICD-10-CM | POA: Insufficient documentation

## 2013-07-14 DIAGNOSIS — Z9861 Coronary angioplasty status: Secondary | ICD-10-CM | POA: Insufficient documentation

## 2013-07-14 HISTORY — PX: COLONOSCOPY: SHX5424

## 2013-07-14 SURGERY — COLONOSCOPY
Anesthesia: Moderate Sedation

## 2013-07-14 MED ORDER — SODIUM CHLORIDE 0.9 % IV SOLN
INTRAVENOUS | Status: DC
  Administered 2013-07-14: 500 mL via INTRAVENOUS

## 2013-07-14 MED ORDER — FENTANYL CITRATE 0.05 MG/ML IJ SOLN
INTRAMUSCULAR | Status: DC | PRN
  Administered 2013-07-14 (×4): 25 ug via INTRAVENOUS

## 2013-07-14 MED ORDER — MIDAZOLAM HCL 5 MG/5ML IJ SOLN
INTRAMUSCULAR | Status: DC | PRN
  Administered 2013-07-14 (×4): 2 mg via INTRAVENOUS

## 2013-07-14 MED ORDER — MIDAZOLAM HCL 10 MG/2ML IJ SOLN
INTRAMUSCULAR | Status: AC
  Filled 2013-07-14: qty 4

## 2013-07-14 MED ORDER — FENTANYL CITRATE 0.05 MG/ML IJ SOLN
INTRAMUSCULAR | Status: AC
  Filled 2013-07-14: qty 4

## 2013-07-14 MED ORDER — DIPHENHYDRAMINE HCL 50 MG/ML IJ SOLN
INTRAMUSCULAR | Status: AC
  Filled 2013-07-14: qty 1

## 2013-07-14 NOTE — H&P (Signed)
   Elizabeth [name] HPI: This is a 57 year old female here today for a routine colonoscopy.  She has some minor complaints of diarrhea in the past.  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    Past Medical History  Diagnosis Date  . Hypertension   . HLD (hyperlipidemia)     Chol = 235, LDL = 156 (08/2010)  . Substance abuse     cocaine   . Depression   . Lupus 2009    ANA + 10/2008, repeat ANA + (05/2009), on that visit 05/2009 following labs obtained RF <20, CRP <0.4, ANA titers 1:80,   . CAD (coronary artery disease)     NSTEMI 08/2011:  LHC 08/21/11: mLAD 60-70%, pCFX occluded, dRCA chronic occlusion with L-R collats, EF 40-45%, inf AK.  PCI:  BMS to CFX.  Marland Kitchen Pulmonary nodules 05/2008    noted on CXR and CT 05/2008, repeat CT 10/2008 - Stable small bilateral pulmonary nodules measuring up to 6 m m  . Insomnia   . GERD (gastroesophageal reflux disease)   . Fibroids   . History of microcytic hypochromic anemia   . Fibromyalgia   . Myocardial infarction     Past Surgical History  Procedure Laterality Date  . Abdominal hysterectomy  05/2003    Family History  Problem Relation Age of Onset  . Heart disease Mother   . Cervical cancer Sister   . Prostate cancer Brother   . Colon cancer Neg Hx     Social History:  reports that she has been smoking Cigarettes.  She has been smoking about 0.30 packs per day. She does not have any smokeless tobacco history on file. She reports that she drinks about 3.5 ounces of alcohol per week. She reports that she does not use illicit drugs.  Allergies: No Known Allergies  Medications:  Scheduled:  Continuous: . sodium chloride 500 mL (07/14/13 0858)    No results found for this or any previous visit (from the past 24 hour(s)).   No results found.  ROS:  As stated above in the HPI otherwise negative.  Temperature 98 F (36.7 C), temperature source Oral, resp. rate 15, height  _0  (1.575 m), weight 189 lb 8 oz (85.957 kg), last menstrual period 12/30/1994, SpO2 97.00%.    PE: Gen: NAD, Alert and Oriented HEENT:  [place]/AT, EOMI Neck: Supple, no LAD Lungs: CTA Bilaterally CV: RRR without M/G/R ABM: Soft, NTND, +BS Ext: No C/C/E  Assessment/Plan: 1) Screening colonoscopy. 2) Personal history of polyps  Plan: 1) Colonoscopy today.  Rasheedah Reis D 07/14/2013, 9:59 AM

## 2013-07-14 NOTE — Op Note (Signed)
[facility] [place], [address]   OPERATIVE PROCEDURE REPORT  PATIENT: Elizabeth [name], Elizabeth [name]  MR#: [id number] BIRTHDATE: January 05, 1956  GENDER: Female ENDOSCOPIST: Carol Ada, MD ASSISTANT:   Jiles Harold, technician Ward Chatters, RN, BSN PROCEDURE DATE: 07/14/2013 PROCEDURE:   Colonoscopy, diagnostic ASA CLASS:   Class III INDICATIONS:Colorectal cancer screening. MEDICATIONS: Versed 8 mg IV and Fentanyl 100 mcg IV  DESCRIPTION OF PROCEDURE:   After the risks benefits and alternatives of the procedure were thoroughly explained, informed consent was obtained.  A digital rectal exam revealed no abnormalities of the rectum.    The Pentax Ped Colon [id number] endoscope was introduced through the anus  and advanced to the cecum, which was identified by both the appendix and ileocecal valve , No adverse events experienced.    The quality of the prep was good. .  The instrument was then slowly withdrawn as the colon was fully examined.     FINDINGS: Scattered diverticulosis was noted throughout the colon, but the majority were in the left side of the colon.  No evidence of any inflammation, ulcerations, erosions, or vascular abnormalities.          The scope was then withdrawn from the patient and the procedure terminated.  COMPLICATIONS: There were no complications.  IMPRESSION: 1) Diverticula. 2) Int/Ext Hemorrhoids.  RECOMMENDATIONS: 1) Repeat the colonoscopy in 10 years.  _______________________________ eSignedCarol Ada, MD 07/14/2013 10:42 AM

## 2013-07-17 ENCOUNTER — Encounter (HOSPITAL_COMMUNITY): Payer: Self-pay | Admitting: Gastroenterology

## 2013-08-07 ENCOUNTER — Telehealth: Payer: Self-pay | Admitting: *Deleted

## 2013-08-07 NOTE — Telephone Encounter (Signed)
Pt called since 08/05/13 urine is yellow. Pt denies freq or burning with urination. Pt taking acid reflux med - still having lots " regurgitation". Has not transportation for today. Appt made for 08/08/13 8:45AM Dr Ronnald Ramp. Hilda Blades Hensley Treat RN 08/07/13 9:15AM

## 2013-08-08 ENCOUNTER — Ambulatory Visit (HOSPITAL_COMMUNITY)
Admission: RE | Admit: 2013-08-08 | Discharge: 2013-08-08 | Disposition: A | Discharge status: Home / Self Care | Payer: Medicare Other | Source: Ambulatory Visit | Attending: Internal Medicine | Admitting: Internal Medicine

## 2013-08-08 ENCOUNTER — Ambulatory Visit (INDEPENDENT_AMBULATORY_CARE_PROVIDER_SITE_OTHER): Payer: Medicare Other | Admitting: Internal Medicine

## 2013-08-08 ENCOUNTER — Other Ambulatory Visit: Payer: Self-pay | Admitting: Internal Medicine

## 2013-08-08 ENCOUNTER — Encounter: Payer: Self-pay | Admitting: Internal Medicine

## 2013-08-08 VITALS — BP 148/90 | HR 82 | Temp 97.9°F | Ht 62.0 in | Wt 190.7 lb

## 2013-08-08 DIAGNOSIS — R599 Enlarged lymph nodes, unspecified: Secondary | ICD-10-CM | POA: Insufficient documentation

## 2013-08-08 DIAGNOSIS — M47817 Spondylosis without myelopathy or radiculopathy, lumbosacral region: Secondary | ICD-10-CM | POA: Insufficient documentation

## 2013-08-08 DIAGNOSIS — I700 Atherosclerosis of aorta: Secondary | ICD-10-CM | POA: Insufficient documentation

## 2013-08-08 DIAGNOSIS — R1013 Epigastric pain: Secondary | ICD-10-CM

## 2013-08-08 DIAGNOSIS — M5137 Other intervertebral disc degeneration, lumbosacral region: Secondary | ICD-10-CM | POA: Diagnosis not present

## 2013-08-08 DIAGNOSIS — N390 Urinary tract infection, site not specified: Secondary | ICD-10-CM | POA: Diagnosis not present

## 2013-08-08 DIAGNOSIS — K838 Other specified diseases of biliary tract: Secondary | ICD-10-CM | POA: Insufficient documentation

## 2013-08-08 DIAGNOSIS — R911 Solitary pulmonary nodule: Secondary | ICD-10-CM | POA: Insufficient documentation

## 2013-08-08 DIAGNOSIS — M51379 Other intervertebral disc degeneration, lumbosacral region without mention of lumbar back pain or lower extremity pain: Secondary | ICD-10-CM | POA: Insufficient documentation

## 2013-08-08 DIAGNOSIS — R82998 Other abnormal findings in urine: Secondary | ICD-10-CM

## 2013-08-08 LAB — COMPREHENSIVE METABOLIC PANEL
ALT: 183 U/L — ABNORMAL HIGH (ref 0–35)
Alkaline Phosphatase: 430 U/L — ABNORMAL HIGH (ref 39–117)
Sodium: 137 mEq/L (ref 135–145)
Total Bilirubin: 1.1 mg/dL (ref 0.3–1.2)
Total Protein: 8.1 g/dL (ref 6.0–8.3)

## 2013-08-08 LAB — CBC WITH DIFFERENTIAL/PLATELET
Eosinophils Absolute: 0.3 10*3/uL (ref 0.0–0.7)
Eosinophils Relative: 5 % (ref 0–5)
Lymphs Abs: 1.2 10*3/uL (ref 0.7–4.0)
MCH: 28.3 pg (ref 26.0–34.0)
MCV: 85.8 fL (ref 78.0–100.0)
Platelets: 207 10*3/uL (ref 150–400)
RBC: 4.94 MIL/uL (ref 3.87–5.11)
RDW: 15.2 % (ref 11.5–15.5)

## 2013-08-08 LAB — POCT URINALYSIS DIPSTICK
Protein, UA: 30
Urobilinogen, UA: 1

## 2013-08-08 MED ORDER — IOHEXOL 300 MG/ML  SOLN
100.0000 mL | Freq: Once | INTRAMUSCULAR | Status: AC | PRN
  Administered 2013-08-08: 100 mL via INTRAVENOUS

## 2013-08-08 MED ORDER — CIPROFLOXACIN HCL 500 MG PO TABS: 500.0000 mg | ORAL_TABLET | Freq: Two times a day (BID) | ORAL | Status: AC

## 2013-08-08 NOTE — Progress Notes (Signed)
Patient ID: SCHAE CANDO, female   DOB: [birth year], 57 y.o.   MRN: [id number]   Subjective:   Patient ID: ESTEFANIE CORNFORTH female   DOB: 1956/07/19 57 y.o.   MRN: [id number]  HPI: Ms.Brandie P Nicosia is a 57 y.o. F w/ PMHx of HTN, HLD, Lupus, CAD (BMS to LCx in 2012), GERD, and depression, presents to the clinic w/ complaints of epigastric pain for the past 3-4 days. She also had accompanying nausea and vomiting which has since subsided. Her abdominal pain she describes as a sharp, constant pain, located in the epigastrium, but originally, also radiated into her RUQ. She claims this pain was a 9/10 on Saturday, but has since improved, and today she reports as a 7/10. She denies any association w/ food, but feels tentative to eat large meals for fear of exacerbating her symptoms. She also claims that the pain is worse at night and with lying down. She does have a history of acid reflux and takes Omeprazole 20 mg, but only takes symptomatically and hasn't taken it since the weekend as she had vomited up a pill on Sunday. She otherwise denies any diarrhea, constipation, hematochezia, or melena. The patient had a colonoscopy on 07/14/13 that was wnl.The patient also claims she had a subjective fever over the weekend, but on exam today, the patient is afebrile.  The patient also mentioned that her urine has been dark in color since the weekend as well. Udip reveals moderate blood, 30 mg/dl protein, positive nitrites, and small leukocytes. She denies any symptoms of dysuria, hematuria, increased frequency urgency, or flank pain.  On exam, the patient shows diffuse tenderness in the abdomen on deep palpation, more so in the epigastrium and RUQ. Murphy's sign was +ve. No CVA tenderness.  Past Medical History  Diagnosis Date  . Hypertension   . HLD (hyperlipidemia)     Chol = 235, LDL = 156 (08/2010)  . Substance abuse     cocaine   . Depression   . Lupus 2009    ANA + 10/2008, repeat ANA +  (05/2009), on that visit 05/2009 following labs obtained RF <20, CRP <0.4, ANA titers 1:80,   . CAD (coronary artery disease)     NSTEMI 08/2011:  LHC 08/21/11: mLAD 60-70%, pCFX occluded, dRCA chronic occlusion with L-R collats, EF 40-45%, inf AK.  PCI:  BMS to CFX.  Marland Kitchen Pulmonary nodules 05/2008    noted on CXR and CT 05/2008, repeat CT 10/2008 - Stable small bilateral pulmonary nodules measuring up to 6 m m  . Insomnia   . GERD (gastroesophageal reflux disease)   . Fibroids   . History of microcytic hypochromic anemia   . Fibromyalgia   . Myocardial infarction    Current Outpatient Prescriptions  Medication Sig Dispense Refill  . aspirin EC 81 MG tablet Take 1 tablet (81 mg total) by mouth daily.  30 tablet  11  . carvedilol (COREG) 6.25 MG tablet Take 1 tablet (6.25 mg total) by mouth 2 (two) times daily.  60 tablet  5  . furosemide (LASIX) 20 MG tablet Take 1 tablet (20 mg total) by mouth daily.  30 tablet  3  . omeprazole (PRILOSEC OTC) 20 MG tablet Take 1 tablet (20 mg total) by mouth daily.  30 tablet  1  . rosuvastatin (CRESTOR) 40 MG tablet Take 1 tablet (40 mg total) by mouth daily.  30 tablet  11  . zolpidem (AMBIEN) 10 MG tablet Take 1 tablet (10 mg  total) by mouth at bedtime as needed for sleep.  30 tablet  2   No current facility-administered medications for this visit.   Family History  Problem Relation Age of Onset  . Heart disease Mother   . Cervical cancer Sister   . Prostate cancer Brother   . Colon cancer Neg Hx    History   Social History  . Marital Status: Divorced    Spouse Name: N/A    Number of Children: N/A  . Years of Education: N/A   Social History Main Topics  . Smoking status: Current Some Day Smoker -- 0.30 packs/day    Types: Cigarettes  . Smokeless tobacco: None     Comment: 3 cigs per day- given QUIT line info  . Alcohol Use: 3.5 oz/week    7 drink(s) per week     Comment: beer 1-2 per day  . Drug Use: No  . Sexual Activity: Not Currently    Other Topics Concern  . None   Social History Narrative  . None   Review of Systems: General: Positive for subjective fever and recent decrease in appetite. Denies chills, diaphoresis, and fatigue.  Respiratory: Denies SOB, DOE, cough, chest tightness, and wheezing.   Cardiovascular: Denies chest pain, palpitations and leg swelling.  Gastrointestinal: Positive for abdominal pain, nausea, and vomiting. Denies diarrhea, constipation, blood in stool and abdominal distention.  Genitourinary: Denies dysuria, urgency, frequency, hematuria, flank pain and difficulty urinating.  Endocrine: Denies hot or cold intolerance, sweats, polyuria, polydipsia. Musculoskeletal: Denies myalgias, back pain, joint swelling, arthralgias and gait problem.  Skin: Denies pallor, rash and wounds.  Neurological: Denies dizziness, seizures, syncope, weakness, lightheadedness, numbness and headaches.  Psychiatric/Behavioral: Denies mood changes, confusion, nervousness, sleep disturbance and agitation.  Objective:  Physical Exam: Filed Vitals:   08/08/13 0857  BP: 148/90  Pulse: 82  Temp: 97.9 F (36.6 C)  TempSrc: Oral  Height: _0  (1.575 m)  Weight: 190 lb 11.2 oz (86.501 kg)  SpO2: 98%   General: Vital signs reviewed.  Patient is a well-developed and well-nourished, in no acute distress and cooperative with exam. Alert and oriented x3.  Head: Normocephalic and atraumatic. Eyes: PERRL, EOMI, conjunctivae normal, No scleral icterus.  Neck: Supple, trachea midline, normal ROM, No JVD, masses, thyromegaly, or carotid bruit present.  Cardiovascular: RRR, S1 normal, S2 normal, no murmurs, gallops, or rubs. Pulmonary/Chest: normal respiratory effort, CTAB, no wheezes, rales, or rhonchi. Abdominal: Soft. Tender to deep palpation diffusely, more so in the epigastrium and RUQ. Non-distended, bowel sounds are normal, no masses, organomegaly, or guarding present.  GU: No CVA tenderness. Musculoskeletal: No  joint deformities, erythema, or stiffness, ROM full and no nontender. Extremities: No swelling or edema,  pulses symmetric and intact bilaterally. No cyanosis or clubbing. Neurological: A&O x3, Strength is normal and symmetric bilaterally, cranial nerve II-XII are grossly intact, no focal motor deficit, sensory intact to light touch bilaterally.  Skin: Warm, dry and intact. No rashes or erythema. Psychiatric: Normal mood and affect. speech and behavior is normal. Cognition and memory are normal.   Assessment & Plan:   Please see problem-based assessment and plan.

## 2013-08-08 NOTE — Progress Notes (Signed)
Patient ID: Elizabeth [name], female   DOB: 1956-08-04, 57 y.o.   MRN: [id number]  I saw [name] with Dr Ronnald Ramp. She is a 57 year old african [place] female who complains of having abdominal pain, which she specifies is diffuse in location, started ion Saturday 08/05/13, also present around suprapubic region, accompanied by nausea and vomiting. She also felt feverish however did not measure temperature. She admits being better today with nausea and vomiting subsiding in their natural course, without the patient taking anything for it, however she still has abdominal, albeit decreased in severity. Her vitals are stable at this visit. In her exam, her abdomen is diffusely mildly tender, with moderate tenderness in the RUQ and epigastric region.   I discussed this case with Dr. Ronnald Ramp. At this time, our differentials are broad and we will work up for gall bladder, pancreas and kidney related pathologies. Another possibility is GERD, and we will put the patient on daily omeprazole for now. Her UA is significant for infection, however she denies dysuria. Given the presentation, we will presently treat her with ciprofloxacin orally and do a close follow up.

## 2013-08-08 NOTE — Assessment & Plan Note (Addendum)
Patientclaims she has had dark urine for the past few days but denies any urinary symptoms. Udip reveals moderate blood, 30 mg/dl protein, positive nitrites, and small leukocytes. -UA/Ucx pending -Given the patient's complaints in addition to subjective fever of the weekend, renal stone and UTI cannot be ruled out. Gave patient Cipro 500 mg bid for 7 days. -CT abdomen/pelvis w/ and w/o contrast performed did not reveal stone, however it did mention small hypodense lesions of the lower poles of each kidney likely which represent cysts.

## 2013-08-08 NOTE — Patient Instructions (Addendum)
1. Please schedule a follow up appointment some time by the end of the week. 2. Please take all medications as prescribed. Please take CIPRO 500 mg twice daily for 7 days (until no pills are left). Also take Omeprazole 20 mg daily. 3. If you have worsening of your symptoms or new symptoms arise, please call the clinic ([phone]), or go to the ER immediately if symptoms are severe.  You have done great job in taking all your medications. I appreciate it very much. Please continue doing that.

## 2013-08-08 NOTE — Assessment & Plan Note (Addendum)
Patient claims to have had abdominal pain since Saturday, accompanied by nausea and vomiting. The N/V has since subsided. She said her pain originally radiated into her RUQ, but now is most significant in the epigastrium and has since improved somewhat. She denies any association with food and claims this pain is generally constant in nature. She is however weary of eating normal meals d/t fear of exacerbating her symptoms. The patient does have a history of GERD and takes Omeprazole 20 mg, but only takes this when symptoms arise. The last time she took it was on Sunday and she vomited up the pill. Differentials at this time include cholecystitis, GERD, pancreatitis, and renal stone. Patient also claims she has had dark urine for the past few days but denies any urinary symptoms. Udip reveals moderate blood, 30 mg/dl protein, positive nitrites, and small leukocytes. -UA/Ucx still pending -Sent for CBC w/ diff, CMET, lipase, and ordered CT abdomen/pelvis w/ and w/out contrast to rule out possible causes of her abdominal pain. CBC and lipase wnl. CMET shows elevated LFTs; AST of 111 and ALT of 183 and Alk phos of 430. CT shows mild abnormal dilation of the common bile duct, although this seems to have potentially been present on prior CT exams back through 2009. These findings along with symptoms suggest possible biliary stone or biliary dysfunction. Will give patient referral to GI to discuss further management at this time. -Will have patient return at the end of the week to discuss improvement/worsening of symptoms. -Instructed patient to take Omeprazole 20 mg qd for control of GERD symptoms in the setting of epigastric pain and will discuss further at next visit.

## 2013-08-09 LAB — URINALYSIS, MICROSCOPIC ONLY
Casts: NONE SEEN
Crystals: NONE SEEN

## 2013-08-09 LAB — URINALYSIS, ROUTINE W REFLEX MICROSCOPIC
Ketones, ur: NEGATIVE mg/dL
Nitrite: POSITIVE — AB
pH: 5 (ref 5.0–8.0)

## 2013-08-09 NOTE — Addendum Note (Signed)
Addended by: Hulan Fray on: 08/09/2013 07:30 PM   Modules accepted: Orders

## 2013-08-14 ENCOUNTER — Inpatient Hospital Stay (HOSPITAL_COMMUNITY)
Admission: EM | Admit: 2013-08-14 | Discharge: 2013-08-17 | DRG: 563 | Disposition: A | Discharge status: Home Health | Payer: Medicare Other | Attending: Internal Medicine | Admitting: Internal Medicine

## 2013-08-14 ENCOUNTER — Emergency Department (HOSPITAL_COMMUNITY): Payer: Medicare Other

## 2013-08-14 ENCOUNTER — Telehealth: Payer: Self-pay | Admitting: Internal Medicine

## 2013-08-14 ENCOUNTER — Encounter (HOSPITAL_COMMUNITY): Payer: Self-pay | Admitting: *Deleted

## 2013-08-14 DIAGNOSIS — Z23 Encounter for immunization: Secondary | ICD-10-CM | POA: Diagnosis not present

## 2013-08-14 DIAGNOSIS — N390 Urinary tract infection, site not specified: Secondary | ICD-10-CM | POA: Diagnosis not present

## 2013-08-14 DIAGNOSIS — F17200 Nicotine dependence, unspecified, uncomplicated: Secondary | ICD-10-CM | POA: Diagnosis present

## 2013-08-14 DIAGNOSIS — R52 Pain, unspecified: Secondary | ICD-10-CM

## 2013-08-14 DIAGNOSIS — F329 Major depressive disorder, single episode, unspecified: Secondary | ICD-10-CM | POA: Diagnosis present

## 2013-08-14 DIAGNOSIS — W1789XA Other fall from one level to another, initial encounter: Secondary | ICD-10-CM | POA: Diagnosis present

## 2013-08-14 DIAGNOSIS — M329 Systemic lupus erythematosus, unspecified: Secondary | ICD-10-CM | POA: Diagnosis present

## 2013-08-14 DIAGNOSIS — I2510 Atherosclerotic heart disease of native coronary artery without angina pectoris: Secondary | ICD-10-CM | POA: Diagnosis present

## 2013-08-14 DIAGNOSIS — S82409A Unspecified fracture of shaft of unspecified fibula, initial encounter for closed fracture: Secondary | ICD-10-CM | POA: Diagnosis not present

## 2013-08-14 DIAGNOSIS — Z01818 Encounter for other preprocedural examination: Secondary | ICD-10-CM | POA: Diagnosis not present

## 2013-08-14 DIAGNOSIS — I10 Essential (primary) hypertension: Secondary | ICD-10-CM | POA: Diagnosis not present

## 2013-08-14 DIAGNOSIS — W19XXXA Unspecified fall, initial encounter: Secondary | ICD-10-CM

## 2013-08-14 DIAGNOSIS — R6889 Other general symptoms and signs: Secondary | ICD-10-CM | POA: Diagnosis not present

## 2013-08-14 DIAGNOSIS — M79609 Pain in unspecified limb: Secondary | ICD-10-CM | POA: Diagnosis not present

## 2013-08-14 DIAGNOSIS — IMO0001 Reserved for inherently not codable concepts without codable children: Secondary | ICD-10-CM | POA: Diagnosis present

## 2013-08-14 DIAGNOSIS — I252 Old myocardial infarction: Secondary | ICD-10-CM

## 2013-08-14 DIAGNOSIS — K219 Gastro-esophageal reflux disease without esophagitis: Secondary | ICD-10-CM | POA: Diagnosis present

## 2013-08-14 DIAGNOSIS — S82109A Unspecified fracture of upper end of unspecified tibia, initial encounter for closed fracture: Secondary | ICD-10-CM | POA: Diagnosis not present

## 2013-08-14 DIAGNOSIS — S82142A Displaced bicondylar fracture of left tibia, initial encounter for closed fracture: Secondary | ICD-10-CM | POA: Diagnosis present

## 2013-08-14 DIAGNOSIS — E785 Hyperlipidemia, unspecified: Secondary | ICD-10-CM | POA: Diagnosis present

## 2013-08-14 DIAGNOSIS — M25569 Pain in unspecified knee: Secondary | ICD-10-CM | POA: Diagnosis not present

## 2013-08-14 DIAGNOSIS — F3289 Other specified depressive episodes: Secondary | ICD-10-CM | POA: Diagnosis present

## 2013-08-14 DIAGNOSIS — Y92009 Unspecified place in unspecified non-institutional (private) residence as the place of occurrence of the external cause: Secondary | ICD-10-CM

## 2013-08-14 LAB — CBC WITH DIFFERENTIAL/PLATELET
Basophils Absolute: 0 10*3/uL (ref 0.0–0.1)
Basophils Relative: 0 % (ref 0–1)
Hemoglobin: 12.3 g/dL (ref 12.0–15.0)
MCHC: 31.5 g/dL (ref 30.0–36.0)
Neutro Abs: 6 10*3/uL (ref 1.7–7.7)
Neutrophils Relative %: 66 % (ref 43–77)
Platelets: 272 10*3/uL (ref 150–400)
RDW: 15.3 % (ref 11.5–15.5)

## 2013-08-14 LAB — BASIC METABOLIC PANEL
Chloride: 104 mEq/L (ref 96–112)
GFR calc Af Amer: >90 mL/min (ref >90)
GFR calc non Af Amer: 81 mL/min — ABNORMAL LOW (ref >90)
Potassium: 3.4 mEq/L — ABNORMAL LOW (ref 3.5–5.1)
Sodium: 140 mEq/L (ref 135–145)

## 2013-08-14 LAB — URINE MICROSCOPIC-ADD ON

## 2013-08-14 LAB — RAPID URINE DRUG SCREEN, HOSP PERFORMED
Benzodiazepines: NOT DETECTED
Cocaine: NOT DETECTED

## 2013-08-14 LAB — URINALYSIS, ROUTINE W REFLEX MICROSCOPIC
Ketones, ur: NEGATIVE mg/dL
Nitrite: NEGATIVE
Specific Gravity, Urine: 1.01 (ref 1.005–1.030)
Urobilinogen, UA: 0.2 mg/dL (ref 0.0–1.0)
pH: 6.5 (ref 5.0–8.0)

## 2013-08-14 MED ORDER — HYDROMORPHONE HCL PF 1 MG/ML IJ SOLN
1.0000 mg | Freq: Once | INTRAMUSCULAR | Status: AC
  Administered 2013-08-14: 1 mg via INTRAVENOUS
  Filled 2013-08-14: qty 1

## 2013-08-14 MED ORDER — ONDANSETRON HCL 4 MG/2ML IJ SOLN
4.0000 mg | Freq: Once | INTRAMUSCULAR | Status: AC
  Administered 2013-08-14: 4 mg via INTRAVENOUS
  Filled 2013-08-14: qty 2

## 2013-08-14 MED ORDER — FENTANYL CITRATE 0.05 MG/ML IJ SOLN
50.0000 ug | Freq: Once | INTRAMUSCULAR | Status: AC
  Administered 2013-08-14: 50 ug via INTRAVENOUS
  Filled 2013-08-14: qty 2

## 2013-08-14 NOTE — ED Notes (Signed)
152/90, Rate 92, resp 20 99%RA  6/10 per EMS  Patient fell off her porch and was able to get herself back into the house.  Called EMS but decided to take her POV but unable to get her into the car.  Called EMS to transport.  No back or neck pain.  Pain to the left knee +swelling, warm to touch, +pulses.  Patient states she had 1 beer.

## 2013-08-14 NOTE — ED Provider Notes (Signed)
CSN: [id number]     Arrival date & time 08/14/13  2001 History   First MD Initiated Contact with Patient 08/14/13 2038     Chief Complaint  Patient presents with  . Knee Pain   (Consider location/radiation/quality/duration/timing/severity/associated sxs/prior Treatment) HPI Comments: 57 year old female presents to the emergency department chief complaint of left knee pain.  Patient states that she fell off of her porch today.  She states she is unsure how the accident occurred however she did remember twisting on the left knee or her foot stayed planted.  Patient states she felt the bone pop.  She had immediate severe pain and swelling and is unable to walk on the limb at all. Patient was brought in by EMS for evaluation.  She denies any numbness or tingling in the left foot.  Patient denies any hip pain.  Patient denies losing consciousness or hitting her head. Past medical history of lupus, fibromyalgia, hypertension, cocaine abuse and previous MI.  Patient's last oral intake was beer, 4 hours ago.  Patient has not eaten solid food since this morning.  Patient is a 57 y.o. female presenting with knee pain. The history is provided by the patient, medical records, a friend and a relative. No language interpreter was used.  Knee Pain Location:  Knee Time since incident:  2 hours Injury: yes   Mechanism of injury comment:  Twist and fall, planted foot Knee location:  L knee Pain details:    Quality:  Throbbing and aching   Radiates to:  Does not radiate   Severity:  Severe   Onset quality:  Sudden   Timing:  Constant Tetanus status:  Up to date (11/2011) Prior injury to area:  No Relieved by:  Nothing Associated symptoms: decreased ROM, stiffness and swelling   Associated symptoms: no fever and no muscle weakness     Past Medical History  Diagnosis Date  . Hypertension   . HLD (hyperlipidemia)     Chol = 235, LDL = 156 (08/2010)  . Substance abuse     cocaine   . Depression   .  Lupus 2009    ANA + 10/2008, repeat ANA + (05/2009), on that visit 05/2009 following labs obtained RF <20, CRP <0.4, ANA titers 1:80,   . CAD (coronary artery disease)     NSTEMI 08/2011:  LHC 08/21/11: mLAD 60-70%, pCFX occluded, dRCA chronic occlusion with L-R collats, EF 40-45%, inf AK.  PCI:  BMS to CFX.  Marland Kitchen Pulmonary nodules 05/2008    noted on CXR and CT 05/2008, repeat CT 10/2008 - Stable small bilateral pulmonary nodules measuring up to 6 m m  . Insomnia   . GERD (gastroesophageal reflux disease)   . Fibroids   . History of microcytic hypochromic anemia   . Fibromyalgia   . Myocardial infarction    Past Surgical History  Procedure Laterality Date  . Abdominal hysterectomy  05/2003  . Colonoscopy N/A 07/14/2013    Procedure: COLONOSCOPY;  Surgeon: Beryle Beams, MD;  Location: [facility];  Service: Endoscopy;  Laterality: N/A;   Family History  Problem Relation Age of Onset  . Heart disease Mother   . Cervical cancer Sister   . Prostate cancer Brother   . Colon cancer Neg Hx    History  Substance Use Topics  . Smoking status: Current Some Day Smoker -- 0.30 packs/day    Types: Cigarettes  . Smokeless tobacco: Not on file     Comment: 3 cigs per day- given  QUIT line info  . Alcohol Use: 3.5 oz/week    7 drink(s) per week     Comment: beer 1-2 per day   OB History   Grav Para Term Preterm Abortions TAB SAB Ect Mult Living                 Review of Systems  Constitutional: Negative for fever and chills.  HENT: Negative for trouble swallowing.   Respiratory: Negative for shortness of breath.   Cardiovascular: Negative for chest pain.  Gastrointestinal: Negative for nausea, vomiting, abdominal pain, diarrhea and constipation.  Genitourinary: Negative for dysuria and hematuria.  Musculoskeletal: Positive for joint swelling, gait problem and stiffness. Negative for myalgias and arthralgias.  Skin: Negative for rash.  Neurological: Negative for numbness.  All other  systems reviewed and are negative.    Allergies  Review of patient's allergies indicates no known allergies.  Home Medications   Current Outpatient Rx  Name  Route  Sig  Dispense  Refill  . aspirin EC 81 MG tablet   Oral   Take 1 tablet (81 mg total) by mouth daily.   30 tablet   11   . carvedilol (COREG) 6.25 MG tablet   Oral   Take 1 tablet (6.25 mg total) by mouth 2 (two) times daily.   60 tablet   5   . furosemide (LASIX) 20 MG tablet   Oral   Take 1 tablet (20 mg total) by mouth daily.   30 tablet   3   . omeprazole (PRILOSEC OTC) 20 MG tablet   Oral   Take 1 tablet (20 mg total) by mouth daily.   30 tablet   1   . rosuvastatin (CRESTOR) 40 MG tablet   Oral   Take 1 tablet (40 mg total) by mouth daily.   30 tablet   11   . zolpidem (AMBIEN) 10 MG tablet   Oral   Take 1 tablet (10 mg total) by mouth at bedtime as needed for sleep.   30 tablet   2    BP 166/96  Pulse 88  Temp(Src) 98.2 F (36.8 C) (Oral)  Resp 21  Ht _0  (1.6 m)  Wt 193 lb (87.544 kg)  BMI 34.2 kg/m2  SpO2 97%  LMP 12/30/1994 Physical Exam  Nursing note and vitals reviewed. Constitutional: She is oriented to person, place, and time. She appears well-developed and well-nourished.  Patient appears very uncomfortable  HENT:  Head: Normocephalic and atraumatic.  Eyes: Conjunctivae are normal. No scleral icterus.  Neck: Normal range of motion.  Cardiovascular: Normal rate, regular rhythm and normal heart sounds.  Exam reveals no gallop and no friction rub.   No murmur heard. Pulmonary/Chest: Effort normal and breath sounds normal. No respiratory distress.  Abdominal: Soft. Bowel sounds are normal. She exhibits no distension and no mass. There is no tenderness. There is no guarding.  Musculoskeletal: She exhibits edema and tenderness.  Patient with large left knee effusion.  She is exquisitely tender to palpation unable to move the joint at all without severe pain.  The patient is  most comfortable at about 20 of flexion.  Distal pulses intact.  Sensation intact.  Neurological: She is alert and oriented to person, place, and time.  Skin: Skin is warm and dry.    ED Course  Procedures (including critical care time) Labs Review Labs Reviewed  CBC WITH DIFFERENTIAL  BASIC METABOLIC PANEL   Imaging Review Dg Knee 2 Views Left  08/14/2013  CLINICAL DATA:  Fall, knee pain.  EXAM: LEFT KNEE - 1-2 VIEW  COMPARISON:  None.  FINDINGS: FRACTURE NOTED THROUGH THE PROXIMAL TIBIA EXTENDING ACROSS THE ENTIRE PROXIMAL TIBIA METAPHYSIS. THERE IS PRESUMED EXTENSION TO THE KNEE JOINT AS THERE IS A LARGE JOINT EFFUSION, BUT DIFFICULT TO SEE ON PLAIN FILMS WITH THE LIMITED POSITIONING DUE TO THE PATIENT'S CONDITION. NO VISIBLE FEMUR OR FIBULA ABNORMALITY.  IMPRESSION: PROXIMAL TIBIAL FRACTURE WITH PRESUMED EXTENSION INTO THE JOINT SPACE. LARGE JOINT EFFUSION.   Electronically Signed   By: Rolm Baptise M.D.   On: 08/14/2013 21:20    MDM   1. Tibial plateau fracture, left, closed, initial encounter   BP 166/96  Pulse 88  Temp(Src) 98.2 F (36.8 C) (Oral)  Resp 21  Ht _0  (1.6 m)  Wt 193 lb (87.544 kg)  BMI 34.2 kg/m2  SpO2 97%  LMP 12/30/1994 Patient with tibial plateau frax. I spoke with Dr. Fredonia Highland who asks that the patient receive CT of the knee, place in knee immoblizer. NWB at home and follow up in clinic on Wednesday.  I have discussed the case with The patient who states her understanding and agrees with the plan. Patient also appears to have a UTI.  I have treated the patient for UTI. Patient is to be discharged with crutches and knee immobilizer. She will follow up with Dr. Fredonia Highland on Wednesday.    Margarita Mail, PA-C 08/16/13 [phone]

## 2013-08-14 NOTE — Telephone Encounter (Signed)
Called Ms. Forstner to discuss her CT results and inquire about how she is feeling. She says she is feeling fine and is not having any current abdominal pain and it has not been bothering her at all lately. She claims she is feeling much better since her clinic visit on 08/08/13. Even though she is feeling much better, her CMET results showed Alk Phos of 430, AST of 11, and ALT of 183. She will most likely need a GI referral in the future. She was told to call the clinic to make a follow up appointment in 2-4 weeks and for follow up blood work.

## 2013-08-15 ENCOUNTER — Encounter (HOSPITAL_COMMUNITY): Payer: Self-pay | Admitting: *Deleted

## 2013-08-15 DIAGNOSIS — I10 Essential (primary) hypertension: Secondary | ICD-10-CM

## 2013-08-15 DIAGNOSIS — S82142A Displaced bicondylar fracture of left tibia, initial encounter for closed fracture: Secondary | ICD-10-CM | POA: Diagnosis present

## 2013-08-15 DIAGNOSIS — W19XXXA Unspecified fall, initial encounter: Secondary | ICD-10-CM

## 2013-08-15 DIAGNOSIS — S82109A Unspecified fracture of upper end of unspecified tibia, initial encounter for closed fracture: Principal | ICD-10-CM

## 2013-08-15 LAB — BASIC METABOLIC PANEL
BUN: 15 mg/dL (ref 6–23)
CO2: 24 mEq/L (ref 19–32)
Chloride: 103 mEq/L (ref 96–112)
GFR calc Af Amer: >90 mL/min (ref >90)
Glucose, Bld: 112 mg/dL — ABNORMAL HIGH (ref 70–99)
Potassium: 3.7 mEq/L (ref 3.5–5.1)

## 2013-08-15 LAB — MAGNESIUM: Magnesium: 2.2 mg/dL (ref 1.5–2.5)

## 2013-08-15 LAB — ETHANOL: Alcohol, Ethyl (B): <11 mg/dL (ref 0–11)

## 2013-08-15 MED ORDER — OXYCODONE-ACETAMINOPHEN 5-325 MG PO TABS
2.0000 | ORAL_TABLET | Freq: Once | ORAL | Status: AC
  Administered 2013-08-15: 2 via ORAL
  Filled 2013-08-15: qty 2

## 2013-08-15 MED ORDER — OXYCODONE-ACETAMINOPHEN 5-325 MG PO TABS
1.0000 | ORAL_TABLET | ORAL | Status: DC | PRN
  Administered 2013-08-15 (×2): 2 via ORAL
  Filled 2013-08-15 (×2): qty 2

## 2013-08-15 MED ORDER — FUROSEMIDE 20 MG PO TABS
20.0000 mg | ORAL_TABLET | Freq: Every day | ORAL | Status: DC
  Administered 2013-08-15 – 2013-08-17 (×3): 20 mg via ORAL
  Filled 2013-08-15 (×3): qty 1

## 2013-08-15 MED ORDER — ONDANSETRON HCL 4 MG/2ML IJ SOLN: 4.0000 mg | Freq: Four times a day (QID) | INTRAMUSCULAR | Status: DC | PRN

## 2013-08-15 MED ORDER — ONDANSETRON HCL 4 MG PO TABS: 4.0000 mg | ORAL_TABLET | Freq: Four times a day (QID) | ORAL | Status: DC | PRN

## 2013-08-15 MED ORDER — OXYCODONE-ACETAMINOPHEN 5-325 MG PO TABS
2.0000 | ORAL_TABLET | ORAL | Status: DC | PRN
  Administered 2013-08-15: 2 via ORAL
  Filled 2013-08-15: qty 2

## 2013-08-15 MED ORDER — ENOXAPARIN SODIUM 40 MG/0.4ML ~~LOC~~ SOLN
40.0000 mg | SUBCUTANEOUS | Status: DC
  Administered 2013-08-15 – 2013-08-17 (×3): 40 mg via SUBCUTANEOUS
  Filled 2013-08-15 (×3): qty 0.4

## 2013-08-15 MED ORDER — INFLUENZA VAC SPLIT QUAD 0.5 ML IM SUSP
0.5000 mL | INTRAMUSCULAR | Status: AC
  Administered 2013-08-16: 10:00:00 0.5 mL via INTRAMUSCULAR
  Filled 2013-08-15: qty 0.5

## 2013-08-15 MED ORDER — SODIUM CHLORIDE 0.9 % IJ SOLN
3.0000 mL | Freq: Two times a day (BID) | INTRAMUSCULAR | Status: DC
  Administered 2013-08-15 – 2013-08-17 (×3): 3 mL via INTRAVENOUS

## 2013-08-15 MED ORDER — ATORVASTATIN CALCIUM 40 MG PO TABS
40.0000 mg | ORAL_TABLET | Freq: Every day | ORAL | Status: DC
  Administered 2013-08-15 – 2013-08-16 (×2): 40 mg via ORAL
  Filled 2013-08-15 (×3): qty 1

## 2013-08-15 MED ORDER — OXYCODONE HCL 5 MG PO TABS
15.0000 mg | ORAL_TABLET | ORAL | Status: DC | PRN
  Administered 2013-08-15 – 2013-08-16 (×6): 15 mg via ORAL
  Filled 2013-08-15 (×6): qty 3

## 2013-08-15 MED ORDER — PANTOPRAZOLE SODIUM 40 MG PO TBEC
40.0000 mg | DELAYED_RELEASE_TABLET | Freq: Every day | ORAL | Status: DC
  Administered 2013-08-15 – 2013-08-17 (×3): 40 mg via ORAL
  Filled 2013-08-15 (×3): qty 1

## 2013-08-15 MED ORDER — CARVEDILOL 6.25 MG PO TABS
6.2500 mg | ORAL_TABLET | Freq: Two times a day (BID) | ORAL | Status: DC
  Administered 2013-08-15 – 2013-08-17 (×5): 6.25 mg via ORAL
  Filled 2013-08-15 (×7): qty 1

## 2013-08-15 MED ORDER — POTASSIUM CHLORIDE CRYS ER 20 MEQ PO TBCR
20.0000 meq | EXTENDED_RELEASE_TABLET | Freq: Once | ORAL | Status: AC
  Administered 2013-08-15: 20 meq via ORAL
  Filled 2013-08-15: qty 1

## 2013-08-15 MED ORDER — OXYCODONE-ACETAMINOPHEN 10-325 MG PO TABS: 0.5000 | ORAL_TABLET | ORAL | Status: DC | PRN

## 2013-08-15 NOTE — Clinical Social Work Psychosocial (Signed)
Clinical Social Work Department BRIEF PSYCHOSOCIAL ASSESSMENT 08/15/2013  Patient:  Elizabeth [name], Elizabeth [name]     Account Number:  [id number]     Admit date:  08/14/2013  Clinical Social Worker:  Lovey Newcomer  Date/Time:  08/15/2013 02:40 PM  Referred by:  Physician  Date Referred:  08/15/2013 Referred for  SNF Placement   Other Referral:   Interview type:  Patient Other interview type:    PSYCHOSOCIAL DATA Living Status:  ALONE Admitted from facility:   Level of care:   Primary support name:  Lazarus Salines [phone] Primary support relationship to patient:  SIBLING Degree of support available:   Support is good.    CURRENT CONCERNS Current Concerns  Post-Acute Placement   Other Concerns:    SOCIAL WORK ASSESSMENT / PLAN CSW met with patient to discuss the possibility of patient needing SNF after hospital stay due to leg injury. Patient would prefer to go home but is agreeable to SNF search as backup in case she changes her mind. Patient has no past SNF experience and no preference at this time. CSW will fax out patient's info and inform patient of bed offers. Patient states that she would be interested in [facility] as well.   Assessment/plan status:  Psychosocial Support/Ongoing Assessment of Needs Other assessment/ plan:   Complete FL2, PASRR, Fax out   Information/referral to community resources:   SNF list and CSW contact information left with patient.    PATIENT'S/FAMILY'S RESPONSE TO PLAN OF CARE: Patient is agreeable to SNF search as backup plan. Patient appeared to be in quite a bit of pain but was appreciative of CSW visit. Patient awaits bed offers.     Elizabeth [name], [place], [place], [phone]

## 2013-08-15 NOTE — H&P (Signed)
Date: 08/15/2013               Patient Name:  Elizabeth [name] MRN: [id number]  DOB: [birth year] Age / Sex: 57 y.o., female   PCP: [name], MD         Medical Service: Internal Medicine Teaching Service         Attending Physician: Dr. Lynnae January    First Contact: Dr. Mechele Claude Pager: [phone]  Second Contact: Dr. Eulas Post Pager: [phone]       After Hours (After 5p/  First Contact Pager: [phone]  weekends / holidays): Second Contact Pager: [phone]   Chief Complaint: left knee pain  History of Present Illness:  Ms. Andreasen is a 57 year old woman with history of HTN, HL, CAD (s/p NSTEMI with BMS to LCx in 2012), lupus, GERD, fibromyalgia, depression, cocaine abuse who presents to the ED with left knee pain after she fell off her neighbor's porch on 9/29 in a "freak accident."  She states that she could not find her phone so she had her friend call it and when she heard it, she just fell off the one step porch.  She felt her left leg twisting and "felt the bone moving" but no pop.  She immediately came to the ED after.  She has noticed both swelling and tingling in her left foot.  She had not taken any sedating medications and had only had one beer prior to the accident.    In ED, she had left knee xrays followed by left knee CT which showed a comminuted and depressed tibial plateau fracture more significantly involving the medial plateau and head. She has received dilaudid 2 mg IV, Fentanyl 15 mcg, and Percocet 5-325 mg x2.  She thinks the Percocet has helped the most of the three with her pain.   She was ready to be discharged home with outpatient orthopedic surgery (Dr. Percell Miller) follow-up, but she did not feel comfortable going home.    Denies right leg pain, left or right hip pain, denies LOC or hitting her head.    Patient had UA in ED and also appears to have asymptomatic UTI as she denies dysuria, frequency, hematuria, fevers.    Meds: No current facility-administered  medications for this encounter.   Current Outpatient Prescriptions  Medication Sig Dispense Refill  . aspirin EC 81 MG tablet Take 1 tablet (81 mg total) by mouth daily.  30 tablet  11  . carvedilol (COREG) 6.25 MG tablet Take 1 tablet (6.25 mg total) by mouth 2 (two) times daily.  60 tablet  5  . furosemide (LASIX) 20 MG tablet Take 1 tablet (20 mg total) by mouth daily.  30 tablet  3  . omeprazole (PRILOSEC OTC) 20 MG tablet Take 1 tablet (20 mg total) by mouth daily.  30 tablet  1  . rosuvastatin (CRESTOR) 40 MG tablet Take 1 tablet (40 mg total) by mouth daily.  30 tablet  11  . zolpidem (AMBIEN) 10 MG tablet Take 1 tablet (10 mg total) by mouth at bedtime as needed for sleep.  30 tablet  2  . oxyCODONE-acetaminophen (PERCOCET) 10-325 MG per tablet Take 0.5-1 tablets by mouth every 4 (four) hours as needed for pain.  30 tablet  0    Allergies: Allergies as of 08/14/2013  . (No Known Allergies)   Past Medical History  Diagnosis Date  . Hypertension   . HLD (hyperlipidemia)     Chol = 235, LDL = 156 (08/2010)  .  Substance abuse     cocaine   . Depression   . Lupus 2009    ANA + 10/2008, repeat ANA + (05/2009), on that visit 05/2009 following labs obtained RF <20, CRP <0.4, ANA titers 1:80,   . CAD (coronary artery disease)     NSTEMI 08/2011:  LHC 08/21/11: mLAD 60-70%, pCFX occluded, dRCA chronic occlusion with L-R collats, EF 40-45%, inf AK.  PCI:  BMS to CFX.  Marland Kitchen Pulmonary nodules 05/2008    noted on CXR and CT 05/2008, repeat CT 10/2008 - Stable small bilateral pulmonary nodules measuring up to 6 m m  . Insomnia   . GERD (gastroesophageal reflux disease)   . Fibroids   . History of microcytic hypochromic anemia   . Fibromyalgia   . Myocardial infarction    Past Surgical History  Procedure Laterality Date  . Abdominal hysterectomy  05/2003  . Colonoscopy N/A 07/14/2013    Procedure: COLONOSCOPY;  Surgeon: Beryle Beams, MD;  Location: [facility];  Service: Endoscopy;   Laterality: N/A;   Family History  Problem Relation Age of Onset  . Heart disease Mother   . Cervical cancer Sister   . Prostate cancer Brother   . Colon cancer Neg Hx    History   Social History  . Marital Status: Divorced    Spouse Name: N/A    Number of Children: N/A  . Years of Education: N/A   Occupational History  . Not on file.   Social History Main Topics  . Smoking status: Current Some Day Smoker -- 0.30 packs/day    Types: Cigarettes  . Smokeless tobacco: Not on file     Comment: 3 cigs per day- given QUIT line info  . Alcohol Use: 3.5 oz/week    7 drink(s) per week     Comment: beer 1-2 per day  . Drug Use: No  . Sexual Activity: Not Currently   Other Topics Concern  . Not on file   Social History Narrative  . No narrative on file    Review of Systems: Review of Systems  Constitutional: Negative for fever and chills.  HENT: Negative for sore throat.   Eyes: Negative for blurred vision.  Respiratory: Negative for cough and shortness of breath.   Cardiovascular: Negative for chest pain, palpitations and leg swelling.  Gastrointestinal: Negative for heartburn, nausea, vomiting and abdominal pain.  Genitourinary: Negative for dysuria and frequency.  Musculoskeletal: Positive for joint pain and falls. Negative for back pain.  Neurological: Negative for dizziness, loss of consciousness, weakness and headaches.    Physical Exam: Blood pressure 166/96, pulse 88, temperature 98.2 F (36.8 C), temperature source Oral, resp. rate 21, height _0  (1.6 m), weight 193 lb (87.544 kg), last menstrual period 12/30/1994, SpO2 97.00%. General: alert, cooperative, and in no apparent distress HEENT: NCAT, vision grossly intact, oropharynx clear and non-erythematous  Neck: supple, no lymphadenopathy Lungs: clear to ascultation bilaterally, normal work of respiration, no wheezes, rales, ronchi Heart: regular rate and rhythm, no murmurs, gallops, or rubs Abdomen: soft,  non-tender, non-distended, normal bowel sounds Extremities: left knee in large brace, left foot is warm and well perfused with 2+ DP pulse; no cyanosis, clubbing, or edema Neurologic: alert & oriented X3, cranial nerves II-XII intact, strength grossly intact, sensation intact to light touch   Lab results: Basic Metabolic Panel:  Recent Labs  08/14/13 2300  NA 140  K 3.4*  CL 104  CO2 25  GLUCOSE 103*  BUN 14  CREATININE 0.80  CALCIUM 8.7   Urine Drug Screen: Drugs of Abuse     Component Value Date/Time   LABOPIA NONE DETECTED 08/14/2013 2215   COCAINSCRNUR NONE DETECTED 08/14/2013 2215   LABBENZ NONE DETECTED 08/14/2013 2215   AMPHETMU NONE DETECTED 08/14/2013 2215   THCU NONE DETECTED 08/14/2013 2215   LABBARB NONE DETECTED 08/14/2013 2215    Urinalysis:  Recent Labs  08/14/13 2215  COLORURINE YELLOW  LABSPEC 1.010  PHURINE 6.5  GLUCOSEU NEGATIVE  HGBUR TRACE*  BILIRUBINUR NEGATIVE  KETONESUR NEGATIVE  PROTEINUR NEGATIVE  UROBILINOGEN 0.2  NITRITE NEGATIVE  LEUKOCYTESUR MODERATE*    Imaging results:  Dg Chest 2 View  08/14/2013   CLINICAL DATA:  Preop.  EXAM: CHEST  2 VIEW  COMPARISON:  05/12/2013  FINDINGS: Heart it is borderline in size. Lungs are clear. No effusions or acute bony abnormality.  IMPRESSION: No acute cardiopulmonary disease.   Electronically Signed   By: Rolm Baptise M.D.   On: 08/14/2013 22:14   Dg Knee 2 Views Left  08/14/2013   CLINICAL DATA:  Fall, knee pain.  EXAM: LEFT KNEE - 1-2 VIEW  COMPARISON:  None.  FINDINGS: FRACTURE NOTED THROUGH THE PROXIMAL TIBIA EXTENDING ACROSS THE ENTIRE PROXIMAL TIBIA METAPHYSIS. THERE IS PRESUMED EXTENSION TO THE KNEE JOINT AS THERE IS A LARGE JOINT EFFUSION, BUT DIFFICULT TO SEE ON PLAIN FILMS WITH THE LIMITED POSITIONING DUE TO THE PATIENT'S CONDITION. NO VISIBLE FEMUR OR FIBULA ABNORMALITY.  IMPRESSION: PROXIMAL TIBIAL FRACTURE WITH PRESUMED EXTENSION INTO THE JOINT SPACE. LARGE JOINT EFFUSION.   Electronically  Signed   By: Rolm Baptise M.D.   On: 08/14/2013 21:20   Ct Knee Left Wo Contrast  08/15/2013   CLINICAL DATA:  [place]. Injured knee. Evaluate tibial plateau fracture.  EXAM: CT OF THE LEFT KNEE WITHOUT CONTRAST  TECHNIQUE: Multidetector CT imaging was performed according to the standard protocol. Multiplanar CT image reconstructions were also generated.  COMPARISON:  Radiographs 08/14/2013.  FINDINGS: There is a comminuted and depressed tibial plateau fracture. The main component is a transverse fracture involving both the medial and lateral plateau. The lateral portion is not significantly displaced or depressed. The medial component does demonstrate depression and impaction along the medial cortex. There is also a transverse fracture through the fibular head. The femur is intact. The patella is normal.  There is an associated lipohemarthrosis. The cruciate and collateral ligaments appear to be intact. The patellar and quadriceps tendons are intact.  IMPRESSION: Comminuted and depressed tibial plateau fracture more significantly involving the medial plateau and head.  Nondisplaced transverse fracture through the fibular head.   Electronically Signed   By: Kalman Jewels M.D.   On: 08/15/2013 00:10    Other results: EKG: ST to 92, normal axis, normal intervals with borderline QT prolongation, T wave inversion in aVL, no ST changes  Assessment & Plan by Problem: #Left tibial plateau fracture- comminuted and depressed fracture also with nondisplaced transverse fracture through fibular head after fall.  Pt endorsing some tinging in left foot so will order neurovascular checks.  Pt prefers oral Percocet for pain control.  UDS negative, EtOH pending.  -strict bed rest (non-weight bearing) -NPO -ortho consulted  -neurovascular checks -Percocet 5-325 mg q4h prn for pain  -Zofran prn nausea -BMP, Mg, INR in AM   #Possible UTI- Pt denies symptoms of UTI (no dysuria, frequency, hematuria).  UA showed  moderate LE.  Asymptomatic, no comorbidities so will not treat for now. -urine culture pending  #  HTN- BP 160s/90s, patient has not been taking home medications due to financial constraints -continue home meds- carvedilol 6.25 mg BID, furosemide 20 mg daily  #HL -continue rosuvastatin 40 mg daily  #CAD- stable; pt has not been taking carvedilol or ASA at home -continue carvedilol, holding ASA for now since she was told she may have surgery  #DVT PPX- lovenox  Dispo: Disposition is deferred at this time, awaiting improvement of current medical problems. Anticipated discharge in approximately 1-2 day(s).   The patient does have a current PCP ([name], MD) and does need an [facility] hospital follow-up appointment after discharge.    Signed: Ivin Poot, MD 08/15/2013, 3:36 AM

## 2013-08-15 NOTE — Care Management Note (Signed)
    Page 1 of 2   08/17/2013     12:37:59 PM   CARE MANAGEMENT NOTE 08/17/2013  Patient:  Elizabeth [name], Elizabeth [name]   Account Number:  [id number]  Date Initiated:  08/15/2013  Documentation initiated by:  Tomi Bamberger  Subjective/Objective Assessment:   dx tibial fx  admit- lives alone.     Action/Plan:   pt /ot eval-recs snf   Anticipated DC Date:  08/17/2013   Anticipated DC Plan:  SKILLED NURSING FACILITY  In-house referral  Clinical Social Worker      DC Forensic scientist  CM consult      [facility] Choice  HOME HEALTH   Choice offered to / List presented to:  C-1 Patient   DME arranged  [place]      DME agency  [place] arranged  HH-1 RN  [place].   Status of service:  Completed, signed off Medicare Important Message given?   (If response is "NO", the following Medicare IM given date fields will be blank) Date Medicare IM given:   Date Additional Medicare IM given:    Discharge Disposition:  [place]  Per UR Regulation:  Reviewed for med. necessity/level of care/duration of stay  If discussed at [place] of Stay Meetings, dates discussed:    Comments:  08/17/13 12:33 Tomi Bamberger RN, BSN [phone] patient is refusing to go to snf today per MD,  informed CSW.  I spoke with patient and she states she wants to go home, her sister will be in and out checking on her. Informed patient that she will need HHRN and HHPT, she chose [place], referral made to [facility] notified. Soc will begin 24-48 hrs post discharge.   Patient states her sister will be here to pick her up after 3 pm today. Patient will be educated on how to give herself lovenox injections which patient states she can do herself. Patient will also need rolling walker and bsc, which was ordered for her.  Spoke with Darrin with [facility] to bring up to patient's room.  08/15/13 15:11 Tomi Bamberger RN,  BSN [phone] patient lives alone, await pt/ot eval.  CSW following for SNF.

## 2013-08-15 NOTE — Progress Notes (Signed)
NURSING PROGRESS NOTE  Elizabeth [name] [id number] Admission Data: 08/15/2013 6:56 AM Attending Provider: Bartholomew Crews, MD LEX:[id number], SHILPA, MD Code Status:FULL CODE  Elizabeth [name] is a 57 y.o. female patient admitted from ED:  -No acute distress noted.  -No complaints of shortness of breath.  -No complaints of chest pain.   Cardiac Monitoring: Box # N/A in place. Cardiac monitor yields:N/A .  Blood pressure 162/90, pulse 88, temperature 98.1 F (36.7 C), temperature source Oral, resp. rate 20, height 5' 3.6" (1.615 m), weight 80 kg (176 lb 5.9 oz), last menstrual period 12/30/1994, SpO2 92.00%.   IV Fluids:  IV in place, occlusive dsg intact without redness, IV cath forearm right, condition patent and no redness none.   Allergies:  Review of patient's allergies indicates no known allergies.  Past Medical History:   has a past medical history of Hypertension; HLD (hyperlipidemia); Substance abuse; Depression; Lupus (2009); CAD (coronary artery disease); Pulmonary nodules (05/2008); Insomnia; GERD (gastroesophageal reflux disease); Fibroids; History of microcytic hypochromic anemia; Fibromyalgia; and Myocardial infarction.  Past Surgical History:   has past surgical history that includes Abdominal hysterectomy (05/2003); Colonoscopy (N/A, 07/14/2013); and stent on chest.  Social History:   reports that she has been smoking Cigarettes.  She has been smoking about 0.30 packs per day. She does not have any smokeless tobacco history on file. She reports that she drinks about 3.5 ounces of alcohol per week. She reports that she does not use illicit drugs.  Skin: Bruise left elbow   Patient/Family orientated to room. Information packet given to patient/family. Admission inpatient armband information verified with patient/family to include name and date of birth and placed on patient arm. Side rails up x 2, fall assessment and education completed with patient/family.  Patient/family able to verbalize understanding of risk associated with falls and verbalized understanding to call for assistance before getting out of bed. Call light within reach. Patient/family able to voice and demonstrate understanding of unit orientation instructions.    Will continue to evaluate and treat per MD orders.

## 2013-08-15 NOTE — Consult Note (Signed)
ORTHOPAEDIC CONSULTATION  REQUESTING PHYSICIAN: Bartholomew Crews, MD  Chief Complaint: Left tibial plateau fracture  HPI: Elizabeth [name] is a 57 y.o. female who complains of  Left knee pain  Past Medical History  Diagnosis Date  . Hypertension   . HLD (hyperlipidemia)     Chol = 235, LDL = 156 (08/2010)  . Substance abuse     cocaine   . Depression   . Lupus 2009    ANA + 10/2008, repeat ANA + (05/2009), on that visit 05/2009 following labs obtained RF <20, CRP <0.4, ANA titers 1:80,   . CAD (coronary artery disease)     NSTEMI 08/2011:  LHC 08/21/11: mLAD 60-70%, pCFX occluded, dRCA chronic occlusion with L-R collats, EF 40-45%, inf AK.  PCI:  BMS to CFX.  Marland Kitchen Pulmonary nodules 05/2008    noted on CXR and CT 05/2008, repeat CT 10/2008 - Stable small bilateral pulmonary nodules measuring up to 6 m m  . Insomnia   . GERD (gastroesophageal reflux disease)   . Fibroids   . History of microcytic hypochromic anemia   . Fibromyalgia   . Myocardial infarction    Past Surgical History  Procedure Laterality Date  . Abdominal hysterectomy  05/2003  . Colonoscopy N/A 07/14/2013    Procedure: COLONOSCOPY;  Surgeon: Beryle Beams, MD;  Location: [facility];  Service: Endoscopy;  Laterality: N/A;  . Stent on chest     History   Social History  . Marital Status: Divorced    Spouse Name: N/A    Number of Children: N/A  . Years of Education: N/A   Social History Main Topics  . Smoking status: Current Some Day Smoker -- 0.30 packs/day    Types: Cigarettes  . Smokeless tobacco: None     Comment: 3 cigs per day- given QUIT line info  . Alcohol Use: 3.5 oz/week    7 drink(s) per week     Comment: beer 1-2 per day  . Drug Use: No  . Sexual Activity: Not Currently   Other Topics Concern  . None   Social History Narrative  . None   Family History  Problem Relation Age of Onset  . Heart disease Mother   . Cervical cancer Sister   . Prostate cancer Brother   .  Colon cancer Neg Hx    No Known Allergies Prior to Admission medications   Medication Sig Start Date End Date Taking? Authorizing Provider  aspirin EC 81 MG tablet Take 1 tablet (81 mg total) by mouth daily. 05/15/13  Yes Na Li, MD  carvedilol (COREG) 6.25 MG tablet Take 1 tablet (6.25 mg total) by mouth 2 (two) times daily. 06/13/13 06/13/14 Yes [name], MD  furosemide (LASIX) 20 MG tablet Take 1 tablet (20 mg total) by mouth daily. 05/15/13  Yes Na Li, MD  omeprazole (PRILOSEC OTC) 20 MG tablet Take 1 tablet (20 mg total) by mouth daily. 06/13/13 06/13/14 Yes [name], MD  rosuvastatin (CRESTOR) 40 MG tablet Take 1 tablet (40 mg total) by mouth daily. 05/15/13 05/15/14 Yes Na Li, MD  zolpidem (AMBIEN) 10 MG tablet Take 1 tablet (10 mg total) by mouth at bedtime as needed for sleep. 06/13/13  Yes [name], MD  oxyCODONE-acetaminophen (PERCOCET) 10-325 MG per tablet Take 0.5-1 tablets by mouth every 4 (four) hours as needed for pain. 08/15/13   Margarita Mail, PA-C   Dg Chest 2 View  08/14/2013   CLINICAL DATA:  Preop.  EXAM: CHEST  2 VIEW  COMPARISON:  05/12/2013  FINDINGS: Heart it is borderline in size. Lungs are clear. No effusions or acute bony abnormality.  IMPRESSION: No acute cardiopulmonary disease.   Electronically Signed   By: Rolm Baptise M.D.   On: 08/14/2013 22:14   Dg Knee 2 Views Left  08/14/2013   CLINICAL DATA:  Fall, knee pain.  EXAM: LEFT KNEE - 1-2 VIEW  COMPARISON:  None.  FINDINGS: FRACTURE NOTED THROUGH THE PROXIMAL TIBIA EXTENDING ACROSS THE ENTIRE PROXIMAL TIBIA METAPHYSIS. THERE IS PRESUMED EXTENSION TO THE KNEE JOINT AS THERE IS A LARGE JOINT EFFUSION, BUT DIFFICULT TO SEE ON PLAIN FILMS WITH THE LIMITED POSITIONING DUE TO THE PATIENT'S CONDITION. NO VISIBLE FEMUR OR FIBULA ABNORMALITY.  IMPRESSION: PROXIMAL TIBIAL FRACTURE WITH PRESUMED EXTENSION INTO THE JOINT SPACE. LARGE JOINT EFFUSION.   Electronically Signed   By: Rolm Baptise M.D.   On: 08/14/2013 21:20    Ct Knee Left Wo Contrast  08/15/2013   CLINICAL DATA:  [place]. Injured knee. Evaluate tibial plateau fracture.  EXAM: CT OF THE LEFT KNEE WITHOUT CONTRAST  TECHNIQUE: Multidetector CT imaging was performed according to the standard protocol. Multiplanar CT image reconstructions were also generated.  COMPARISON:  Radiographs 08/14/2013.  FINDINGS: There is a comminuted and depressed tibial plateau fracture. The main component is a transverse fracture involving both the medial and lateral plateau. The lateral portion is not significantly displaced or depressed. The medial component does demonstrate depression and impaction along the medial cortex. There is also a transverse fracture through the fibular head. The femur is intact. The patella is normal.  There is an associated lipohemarthrosis. The cruciate and collateral ligaments appear to be intact. The patellar and quadriceps tendons are intact.  IMPRESSION: Comminuted and depressed tibial plateau fracture more significantly involving the medial plateau and head.  Nondisplaced transverse fracture through the fibular head.   Electronically Signed   By: Kalman Jewels M.D.   On: 08/15/2013 00:10    Positive ROS: All other systems have been reviewed and were otherwise negative with the exception of those mentioned in the HPI and as above.  Labs cbc  Recent Labs  08/14/13 2300  WBC 9.1  HGB 12.3  HCT 39.1  PLT 272    Labs inflam No results found for this basename: ESR, CRP,  in the last 72 hours  Labs coag  Recent Labs  08/15/13 0705  INR 0.92     Recent Labs  08/14/13 2300 08/15/13 0450  NA 140 139  K 3.4* 3.7  CL 104 103  CO2 25 24  GLUCOSE 103* 112*  BUN 14 15  CREATININE 0.80 0.81  CALCIUM 8.7 8.9    Physical Exam: Filed Vitals:   08/15/13 0440  BP: 162/90  Pulse: 88  Temp: 98.1 F (36.7 C)  Resp: 20   General: Alert, no acute distress Cardiovascular: No pedal edema Respiratory: No cyanosis, no use of  accessory musculature GI: No organomegaly, abdomen is soft and non-tender Skin: No lesions in the area of chief complaint Neurologic: Sensation intact distally Psychiatric: Patient is competent for consent with normal mood and affect Lymphatic: No axillary or cervical lymphadenopathy  MUSCULOSKELETAL:  LLE: pain with ROM SILT DP/SP/S/S/T nerve, 2+ DP, +TA/GS/EHL Compartments soft RUE: abrasion over elbow with no underlying crepitous or ROM. Distally NVI Other extremities are atraumatic with painless ROM and NVI.  Assessment: Left tibial plateau fx.   Plan: She will need ORIF of her plateau once swelling  and inflamation has subsided (likely 10/7)       -this can be done as an outpatient.  Weight Bearing Status: NWB LLE PT/OT for mobility and home planning VTE px: SCD's and Lovenox, please hold the morning of surgery.    Edmonia Lynch, D, MD Cell [phone]   08/15/2013 8:56 AM

## 2013-08-15 NOTE — H&P (Signed)
  Date: 08/15/2013  Patient name: Elizabeth [name]  Medical record number: [id number]  Date of birth: 1956/03/10   I have seen and evaluated Elizabeth [name] and discussed their care with the Residency Team. Ms Hiraldo was admitted with a L tibial plateau fracture. Surgery is tentatively planned for next week as an outpt.  Assessment and Plan: I have seen and evaluated the patient as outlined above. I agree with the formulated Assessment and Plan as detailed in the residents' admission note, with the following changes:   1. L tibial plateau fracture - definitve tx per Dr Percell Miller. Pain control with percocet. PT/OT consult. The pt lives alone but has a retired sister who would be able to assist her with ADL's. Pt's preference is to return home. The sister is also able to drive her to appts.   2. HTN - uncontrolled as pt has not been able to take meds at home. Resume home meds and follow.  Possible D/C today once PT/OT eval pt.   Bartholomew Crews, MD 9/30/20142:32 PM

## 2013-08-15 NOTE — Clinical Social Work Placement (Signed)
Clinical Social Work Department CLINICAL SOCIAL WORK PLACEMENT NOTE 08/15/2013  Patient:  Elizabeth [name], Elizabeth [name]  Account Number:  [id number] Admit date:  08/14/2013  Clinical Social Worker:  Lovey Newcomer  Date/time:  08/15/2013 02:50 PM  Clinical Social Work is seeking post-discharge placement for this patient at the following level of care:   SKILLED NURSING   (*CSW will update this form in Epic as items are completed)   08/15/2013  Patient/family provided with [place] Department of Clinical Social Work's list of facilities offering this level of care within the geographic area requested by the patient (or if unable, by the patient's family).  08/15/2013  Patient/family informed of their freedom to choose among providers that offer the needed level of care, that participate in Medicare, Medicaid or managed care program needed by the patient, have an available bed and are willing to accept the patient.  08/15/2013  Patient/family informed of [facility]' ownership interest in [facility], as well as of the fact that they are under no obligation to receive care at this facility.  PASARR submitted to EDS on 08/15/2013 PASARR number received from EDS on 08/15/2013  FL2 transmitted to all facilities in geographic area requested by pt/family on  08/15/2013 FL2 transmitted to all facilities within larger geographic area on   Patient informed that his/her managed care company has contracts with or will negotiate with  certain facilities, including the following:     Patient/family informed of bed offers received:   Patient chooses bed at  Physician recommends and patient chooses bed at    Patient to be transferred to  on   Patient to be transferred to facility by   The following physician request were entered in Epic:   Additional Comments:   Liz Beach, [place], [place], [phone]

## 2013-08-15 NOTE — Progress Notes (Addendum)
PT Cancellation Note  Patient Details Name: Elizabeth [name] MRN: [id number] DOB: 1956-07-22   Cancelled Treatment:    Reason Eval/Treat Not Completed: Pain limiting ability to participate (Attempted x 3 after pt was given pain meds.  )    Katrese Shell 08/15/2013, 3:30 PM

## 2013-08-15 NOTE — Progress Notes (Signed)
Orthopedic Tech Progress Note Patient Details:  Elizabeth [name] [birth year] [id number]  Ortho Devices Type of Ortho Device: Crutches;Knee Immobilizer Ortho Device/Splint Location: left LE Ortho Device/Splint Interventions: Application Increased pain upon application   Tlaloc Taddei T 08/15/2013, 1:04 AM

## 2013-08-15 NOTE — ED Provider Notes (Signed)
Pt was to be d/c home, but was unable to stand, even with NWB and crutches.  D/w Dr Maretta Los who requests medicine admission for pain control, medical problem management.  He will see the patient in the am.   Kalman Drape, MD 08/15/13 [phone]

## 2013-08-16 DIAGNOSIS — M79609 Pain in unspecified limb: Secondary | ICD-10-CM

## 2013-08-16 DIAGNOSIS — R52 Pain, unspecified: Secondary | ICD-10-CM

## 2013-08-16 DIAGNOSIS — I10 Essential (primary) hypertension: Secondary | ICD-10-CM | POA: Diagnosis not present

## 2013-08-16 DIAGNOSIS — S82109A Unspecified fracture of upper end of unspecified tibia, initial encounter for closed fracture: Secondary | ICD-10-CM | POA: Diagnosis not present

## 2013-08-16 LAB — BASIC METABOLIC PANEL
BUN: 11 mg/dL (ref 6–23)
CO2: 26 mEq/L (ref 19–32)
Chloride: 100 mEq/L (ref 96–112)
Creatinine, Ser: 0.79 mg/dL (ref 0.50–1.10)
Glucose, Bld: 105 mg/dL — ABNORMAL HIGH (ref 70–99)

## 2013-08-16 LAB — URINE CULTURE: Culture: NO GROWTH

## 2013-08-16 MED ORDER — OXYCODONE HCL 5 MG PO TABS
10.0000 mg | ORAL_TABLET | ORAL | Status: DC | PRN
  Administered 2013-08-16 – 2013-08-17 (×5): 10 mg via ORAL
  Filled 2013-08-16 (×5): qty 2

## 2013-08-16 MED ORDER — ENOXAPARIN (LOVENOX) PATIENT EDUCATION KIT: PACK | Freq: Once | Status: DC

## 2013-08-16 MED ORDER — OXYCODONE HCL ER 15 MG PO T12A
15.0000 mg | EXTENDED_RELEASE_TABLET | Freq: Two times a day (BID) | ORAL | Status: AC
  Filled 2013-08-16: qty 1

## 2013-08-16 MED ORDER — OXYCODONE HCL ER 15 MG PO T12A
15.0000 mg | EXTENDED_RELEASE_TABLET | Freq: Two times a day (BID) | ORAL | Status: DC
  Administered 2013-08-16 (×2): 15 mg via ORAL
  Filled 2013-08-16: qty 1

## 2013-08-16 NOTE — ED Provider Notes (Signed)
Medical screening examination/treatment/procedure(s) were performed by non-physician practitioner and as supervising physician I was immediately available for consultation/collaboration.   Blanchard Kelch, MD 08/16/13 2159

## 2013-08-16 NOTE — Progress Notes (Signed)
Subjective: Patient still with significant pain to her L knee. She was seen by physical therapy yesterday, but did not participate 2/2 pain. Patient does have a sister at home who can assist her if she were to go home. Patient prefers not to go to a SNF. I spoke with ortho today and they have her on the OR schedule for Tuesday, the earliest they will do her surgery is Monday in order for the swelling to decrease. Ortho thinks patient would do well with home health.   Objective: Vital signs in last 24 hours: Filed Vitals:   08/15/13 1435 08/15/13 1724 08/15/13 2029 08/16/13 0609  BP: 169/82 165/92 168/101 126/78  Pulse: 82 70 69 93  Temp: 98.3 F (36.8 C)  98.8 F (37.1 C) 99.5 F (37.5 C)  TempSrc: Oral  Oral Oral  Resp: _0 Height:      Weight:      SpO2: 96%  93% 92%   Weight change:   Intake/Output Summary (Last 24 hours) at 08/16/13 1318 Last data filed at 08/16/13 0025  Gross per 24 hour  Intake    120 ml  Output      0 ml  Net    120 ml   Physical Exam General: alert, cooperative, and in no apparent distress HEENT: NCAT, vision grossly intact Neck: supple Lungs: clear to ascultation bilaterally, normal work of respiration, no wheezes, rales, ronchi Heart: regular rate and rhythm, no murmurs, gallops, or rubs Abdomen: soft, non-tender, non-distended, normal bowel sounds Extremities: 2+ DP/PT pulses bilaterally, no cyanosis, clubbing, or edema; pain to L knee, did not assess ROM given knee immobilizer in place Neurologic: alert & oriented X3, cranial nerves II-XII grossly intact, strength grossly intact, sensation intact to light touch  Lab Results: Basic Metabolic Panel:  Recent Labs Lab 08/15/13 0450 08/16/13 0415  NA 139 137  K 3.7 3.8  CL 103 100  CO2 24 26  GLUCOSE 112* 105*  BUN 15 11  CREATININE 0.81 0.79  CALCIUM 8.9 9.2  MG 2.2  --    CBC:  Recent Labs Lab 08/14/13 2300  WBC 9.1  NEUTROABS 6.0  HGB 12.3  HCT 39.1  MCV 86.7  PLT  272   Coagulation:  Recent Labs Lab 08/15/13 0705  LABPROT 12.2  INR 0.92   Urine Drug Screen: Drugs of Abuse     Component Value Date/Time   LABOPIA NONE DETECTED 08/14/2013 2215   COCAINSCRNUR NONE DETECTED 08/14/2013 2215   LABBENZ NONE DETECTED 08/14/2013 2215   AMPHETMU NONE DETECTED 08/14/2013 2215   THCU NONE DETECTED 08/14/2013 2215   LABBARB NONE DETECTED 08/14/2013 2215    Alcohol Level:  Recent Labs Lab 08/15/13 0450  ETH <11   Urinalysis:  Recent Labs Lab 08/14/13 2215  COLORURINE YELLOW  LABSPEC 1.010  PHURINE 6.5  GLUCOSEU NEGATIVE  HGBUR TRACE*  BILIRUBINUR NEGATIVE  KETONESUR NEGATIVE  PROTEINUR NEGATIVE  UROBILINOGEN 0.2  NITRITE NEGATIVE  LEUKOCYTESUR MODERATE*    Micro Results: Recent Results (from the past 240 hour(s))  URINE CULTURE     Status: None   Collection Time    08/14/13 10:15 PM      Result Value Range Status   Specimen Description URINE, CLEAN CATCH   Final   Special Requests CX ADDED AT 2241 ON [id number]   Final   Culture  Setup Time     Final   Value: 08/14/2013 23:18     Performed at [facility]   [place]     Final   Value: NO GROWTH     Performed at [facility]     Final   Value: NO GROWTH     Performed at [facility]   Report Status 08/16/2013 FINAL   Final   Studies/Results: Dg Chest 2 View  08/14/2013   CLINICAL DATA:  Preop.  EXAM: CHEST  2 VIEW  COMPARISON:  05/12/2013  FINDINGS: Heart it is borderline in size. Lungs are clear. No effusions or acute bony abnormality.  IMPRESSION: No acute cardiopulmonary disease.   Electronically Signed   By: Rolm Baptise M.D.   On: 08/14/2013 22:14   Dg Knee 2 Views Left  08/14/2013   CLINICAL DATA:  Fall, knee pain.  EXAM: LEFT KNEE - 1-2 VIEW  COMPARISON:  None.  FINDINGS: FRACTURE NOTED THROUGH THE PROXIMAL TIBIA EXTENDING ACROSS THE ENTIRE PROXIMAL TIBIA METAPHYSIS. THERE IS PRESUMED EXTENSION TO THE KNEE JOINT AS THERE IS A LARGE JOINT  EFFUSION, BUT DIFFICULT TO SEE ON PLAIN FILMS WITH THE LIMITED POSITIONING DUE TO THE PATIENT'S CONDITION. NO VISIBLE FEMUR OR FIBULA ABNORMALITY.  IMPRESSION: PROXIMAL TIBIAL FRACTURE WITH PRESUMED EXTENSION INTO THE JOINT SPACE. LARGE JOINT EFFUSION.   Electronically Signed   By: Rolm Baptise M.D.   On: 08/14/2013 21:20   Ct Knee Left Wo Contrast  08/15/2013   CLINICAL DATA:  [place]. Injured knee. Evaluate tibial plateau fracture.  EXAM: CT OF THE LEFT KNEE WITHOUT CONTRAST  TECHNIQUE: Multidetector CT imaging was performed according to the standard protocol. Multiplanar CT image reconstructions were also generated.  COMPARISON:  Radiographs 08/14/2013.  FINDINGS: There is a comminuted and depressed tibial plateau fracture. The main component is a transverse fracture involving both the medial and lateral plateau. The lateral portion is not significantly displaced or depressed. The medial component does demonstrate depression and impaction along the medial cortex. There is also a transverse fracture through the fibular head. The femur is intact. The patella is normal.  There is an associated lipohemarthrosis. The cruciate and collateral ligaments appear to be intact. The patellar and quadriceps tendons are intact.  IMPRESSION: Comminuted and depressed tibial plateau fracture more significantly involving the medial plateau and head.  Nondisplaced transverse fracture through the fibular head.   Electronically Signed   By: Kalman Jewels M.D.   On: 08/15/2013 00:10   Medications: I have reviewed the patient's current medications. Scheduled Meds: . atorvastatin  40 mg Oral q1800  . carvedilol  6.25 mg Oral BID WC  . enoxaparin (LOVENOX) injection  40 mg Subcutaneous Q24H  . furosemide  20 mg Oral Daily  . OxyCODONE  15 mg Oral Q12H  . pantoprazole  40 mg Oral Daily  . sodium chloride  3 mL Intravenous Q12H   Continuous Infusions:  PRN Meds:.ondansetron (ZOFRAN) IV, ondansetron,  oxyCODONE  Assessment/Plan: Left tibial plateau fracture- comminuted and depressed fracture also with nondisplaced transverse fracture through fibular head after fall.  UDS negative. -strict bed rest (non-weight bearing)  -PT/OT/SW/CM -heart diet -ortho consulted- will do surgery on Tuesday next week; recommend HH  -Percocet was increased to oxycodone 39m q3h yesterday, though still uncontrolled pain, therefore I altered her pain regimen today to include oxycontin 184mBID w/ Oxy IR 1065m4h prn for breakthrough -Zofran prn nausea  -K low yesterday at 3.3, supped with Kdur 29m44mnce and today K 3.8 -will continue lovenox for DVT ppx at discharge (will hold lovenox on  morning of surgery)  #Possible UTI- Pt denies symptoms of UTI (no dysuria, frequency, hematuria). UA showed moderate LE. Asymptomatic, no comorbidities so will not treat for now.  -urine culture- NGTD  #HTN- BP 160s/90s on admission. She had not been taking home medications due to financial constraints and was in acute pain. We added back on her home medications per below and her BPs have stabilized (126/78). -continue home meds- carvedilol 6.25 mg BID, furosemide 20 mg daily   #HL  -continue rosuvastatin 40 mg daily   #CAD- stable; pt has not been taking carvedilol or ASA at home  -continue carvedilol, holding ASA for now since she was told she may have surgery  -will continue holding ASA at discharge  #DVT PPX- lovenox  Dispo: Disposition is deferred at this time, awaiting improvement of current medical problems.  Anticipated discharge in approximately 1 day(s).   The patient does have a current PCP ([name], MD) and does not need an [facility] hospital follow-up appointment after discharge.  The patient does not have transportation limitations that hinder transportation to clinic appointments.  .Services Needed at time of discharge: Y = Yes, Blank = No PT:   OT:   RN:   Equipment:   Other:     LOS: 2 days    Rebecca Eaton, MD 08/16/2013, 1:18 PM

## 2013-08-16 NOTE — Clinical Social Work Placement (Signed)
Clinical Social Work Department CLINICAL SOCIAL WORK PLACEMENT NOTE 08/16/2013  Patient:  Elizabeth [name], Elizabeth [name]  Account Number:  [id number] Admit date:  08/14/2013  Clinical Social Worker:  Lovey Newcomer  Date/time:  08/15/2013 02:50 PM  Clinical Social Work is seeking post-discharge placement for this patient at the following level of care:   SKILLED NURSING   (*CSW will update this form in Epic as items are completed)   08/15/2013  Patient/family provided with [place] Department of Clinical Social Work's list of facilities offering this level of care within the geographic area requested by the patient (or if unable, by the patient's family).  08/15/2013  Patient/family informed of their freedom to choose among providers that offer the needed level of care, that participate in Medicare, Medicaid or managed care program needed by the patient, have an available bed and are willing to accept the patient.  08/15/2013  Patient/family informed of [facility]' ownership interest in [facility], as well as of the fact that they are under no obligation to receive care at this facility.  PASARR submitted to EDS on 08/15/2013 PASARR number received from EDS on 08/15/2013  FL2 transmitted to all facilities in geographic area requested by pt/family on  08/15/2013 FL2 transmitted to all facilities within larger geographic area on   Patient informed that his/her managed care company has contracts with or will negotiate with  certain facilities, including the following:     Patient/family informed of bed offers received:  08/16/2013 Patient chooses bed at  Physician recommends and patient chooses bed at    Patient to be transferred to  on   Patient to be transferred to facility by   The following physician request were entered in Epic:   Additional Comments:   Liz Beach, [place], [phone]

## 2013-08-16 NOTE — Progress Notes (Signed)
  Date: 08/16/2013  Patient name: Elizabeth [name]  Medical record number: [id number]  Date of birth: [birth year]   This patient has been seen and the plan of care was discussed with the house staff. Please see their note for complete details. I concur with their findings with the following additions/corrections: Continues to complain of pain in her L knee.  She is reluctant to participate with PT. No plans per ortho for surgery this week. Given her uncontrolled pain, I would keep her in the hospital today and titrate her medications. Agree with Oxycontin 15 mg PO bid and Oxycodone IR 10 mg PO q4hrs PRN pain.  If pain is better controlled, she can be D/C tomorrow with HH and ortho f/u for tibial plateau fracture surgery.  Dominic Pea, DO, [facility] 08/16/2013, 2:06 PM

## 2013-08-16 NOTE — Evaluation (Signed)
Occupational Therapy Evaluation Patient Details Name: Elizabeth [name] MRN: [id number] DOB: Oct 26, 1956 Today's Date: 08/16/2013 Time: 2505-3976 OT Time Calculation (min): 31 min  OT Assessment / Plan / Recommendation History of present illness Pt is a 57 y/o female admitted for tibial plateau fracture after falling off her neighbor's porch. ORIF planned for August 22, 2013.    Clinical Impression   This 57 yo admitted with above presents to acute OT with deficits below due to uncontrolled pain in LLE. Will benefit from acute OT with follow up at SNF.    OT Assessment  Patient needs continued OT Services    Follow Up Recommendations  SNF    Barriers to Discharge Decreased caregiver support    Equipment Recommendations   (TBD next venue)       Frequency  Min 2X/week    Precautions / Restrictions Precautions Precautions: Fall;Knee Required Braces or Orthoses: Knee Immobilizer - Left Restrictions Weight Bearing Restrictions: Yes LLE Weight Bearing: Non weight bearing   Pertinent Vitals/Pain 12/10 pain in LLE with activity    ADL  Eating/Feeding: Independent Where Assessed - Eating/Feeding: Bed level Grooming: Set up;Supervision/safety Where Assessed - Grooming: Unsupported sitting Upper Body Bathing: Supervision/safety;Set up Where Assessed - Upper Body Bathing: Unsupported sitting Lower Body Bathing: +1 Total assistance Where Assessed - Lower Body Bathing: Supine, head of bed up;Supine, head of bed flat;Rolling right and/or left Upper Body Dressing: Set up Where Assessed - Upper Body Dressing: Unsupported sitting Lower Body Dressing: +1 Total assistance Where Assessed - Lower Body Dressing: Supine, head of bed up;Supine, head of bed flat;Rolling right and/or left Toilet Transfer:  (unable to even stand pivot today) Toileting - Clothing Manipulation and Hygiene: +1 Total assistance Where Assessed - Toileting Clothing Manipulation and Hygiene: Supine, head of bed  flat;Rolling right and/or left Equipment Used:  (Ki) Transfers/Ambulation Related to ADLs: Unable even with +3 A due to pain and unable to maintain [place] on LLE    OT Diagnosis: Generalized weakness;Acute pain  OT Problem List: Decreased strength;Impaired balance (sitting and/or standing);Pain;Decreased knowledge of use of DME or AE OT Treatment Interventions: Self-care/ADL training;Balance training;DME and/or AE instruction;Patient/family education;Therapeutic activities;Therapeutic exercise   OT Goals(Current goals can be found in the care plan section) Acute Rehab OT Goals Patient Stated Goal: To stop hurting OT Goal Formulation: With patient Time For Goal Achievement: 08/30/13 Potential to Achieve Goals: Good  Visit Information  Last OT Received On: 08/16/13 Assistance Needed: +2 PT/OT Co-Evaluation/Treatment: Yes History of Present Illness: Pt is a 57 y/o female admitted for tibial plateau fracture after falling off her neighbor's porch. ORIF planned for August 22, 2013.        Prior [place] expects to be discharged to:: Private residence Living Arrangements: Alone Available Help at Discharge: Family (Pt reports sister is available to stay with her) Home Access: Stairs to enter Technical brewer of Steps: 3-4 Home Layout: One level Prior Function Level of Independence: Independent Communication Communication: No difficulties         Vision/Perception Vision - History Patient Visual Report: No change from baseline   Cognition  Cognition Arousal/Alertness: Awake/alert Behavior During Therapy: Agitated (at times due to pain) Overall Cognitive Status: Within Functional Limits for tasks assessed    Extremity/Trunk Assessment Upper Extremity Assessment Upper Extremity Assessment: Overall WFL for tasks assessed Lower Extremity Assessment Lower Extremity Assessment: LLE deficits/detail LLE Deficits / Details: Decreased  strength and AROM consistent with tibial plateau fracture. Significant pain LLE:  Unable to fully assess due to pain;Unable to fully assess due to immobilization Cervical / Trunk Assessment Cervical / Trunk Assessment: Normal     Mobility Bed Mobility Bed Mobility: Sit to Supine;Scooting to HOB Sit to Supine: 1: +2 Total assist Sit to Supine: Patient Percentage: 10% Scooting to HOB: 1: +2 Total assist;With rail Scooting to [facility]: Patient Percentage: 70% Details for Bed Mobility Assistance: Pt with increased difficulty to perform sit>supine transfer due to pain. Pt requires increased time to mentally prepare herself before transfer was initiated. Transfers Sit to Stand: 1: +2 Total assist;With upper extremity assist;From elevated surface;From bed Sit to Stand: Patient Percentage: 10% Stand to Sit: 1: +2 Total assist;To elevated surface;To bed;With upper extremity assist Stand to Sit: Patient Percentage: 10% Details for Transfer Assistance: Pt unable to clear the bed with max assist +2. Pt participates in beginning of transfer and then when pain is felt she stops participating.        Balance Balance Balance Assessed: Yes Static Sitting Balance Static Sitting - Balance Support: Bilateral upper extremity supported;Feet supported Static Sitting - Level of Assistance: 5: Stand by assistance Static Sitting - Comment/# of Minutes: 5   End of Session OT - End of Session Activity Tolerance: Patient limited by pain Patient left: in bed;with call bell/phone within reach       Almon Register [phone] 08/16/2013, 4:08 PM

## 2013-08-16 NOTE — Evaluation (Signed)
Physical Therapy Evaluation Patient Details Name: Elizabeth [name] MRN: [id number] DOB: 05/17/1956 Today's Date: 08/16/2013 Time: 5885-0277 PT Time Calculation (min): 31 min  PT Assessment / Plan / Recommendation History of Present Illness  Pt is a 57 y/o female admitted for tibial plateau fracture after falling off her neighbor's porch. ORIF planned for August 22, 2013.   Clinical Impression  Pt with increased difficulty performing functional mobility due to pain with any movement, even with knee immobilizer donned. Pt unable to transfer to bedside commode with +3 assist due to pain. At end of session pt reports she would be agreeable to skilled nursing short term rehab prior to returning home alone. This patient would benefit from continued skilled PT services to increase safety and independence with functional mobility and return to PLOF.    PT Assessment  Patient needs continued PT services    Follow Up Recommendations  SNF    Does the patient have the potential to tolerate intense rehabilitation      Barriers to Discharge Inaccessible home environment;Decreased caregiver support      Equipment Recommendations  Rolling walker with 5" wheels;3in1 (PT)    Recommendations for Other Services     Frequency Min 5X/week    Precautions / Restrictions Precautions Precautions: Fall;Knee Required Braces or Orthoses: Knee Immobilizer - Left Restrictions Weight Bearing Restrictions: Yes LLE Weight Bearing: Non weight bearing   Pertinent Vitals/Pain Pt reports 12/10 pain at end of session      Mobility  Bed Mobility Bed Mobility: Sit to Supine;Scooting to HOB Sit to Supine: 1: +2 Total assist Sit to Supine: Patient Percentage: 10% Scooting to HOB: 1: +2 Total assist;With rail Scooting to [employer]: Patient Percentage: 70% Details for Bed Mobility Assistance: Pt with increased difficulty to perform sit>supine transfer due to pain. Pt requires increased time to mentally prepare  herself before transfer was initiated. Transfers Transfers: Sit to Stand;Stand to Sit Sit to Stand: 1: +2 Total assist;With upper extremity assist;From elevated surface;From bed Sit to Stand: Patient Percentage: 10% Stand to Sit: 1: +2 Total assist;To elevated surface;To bed;With upper extremity assist Stand to Sit: Patient Percentage: 10% Details for Transfer Assistance: Pt unable to clear the bed with max assist +2. Pt participates in beginning of transfer and then when pain is felt she stops participating. Ambulation/Gait Ambulation/Gait Assistance: Not tested (comment) (unable at this time.)    Exercises     PT Diagnosis: Difficulty walking;Acute pain  PT Problem List: Decreased strength;Decreased range of motion;Decreased activity tolerance;Decreased balance;Decreased mobility;Decreased knowledge of use of DME;Decreased safety awareness;Pain PT Treatment Interventions: DME instruction;Gait training;Stair training;Functional mobility training;Therapeutic activities;Therapeutic exercise;Neuromuscular re-education;Patient/family education     PT Goals(Current goals can be found in the care plan section) Acute Rehab PT Goals Patient Stated Goal: To stop hurting PT Goal Formulation: With patient Time For Goal Achievement: 08/23/13 Potential to Achieve Goals: Fair  Visit Information  Last PT Received On: 08/16/13 Assistance Needed: +2 PT/OT Co-Evaluation/Treatment: Yes History of Present Illness: Pt is a 57 y/o female admitted for tibial plateau fracture after falling off her neighbor's porch. ORIF planned for August 22, 2013.        Prior [place] expects to be discharged to:: Private residence Living Arrangements: Alone Available Help at Discharge: Family (Pt reports sister is available to stay with her) Home Access: Stairs to enter [employer] of Steps: 3-4 Home Layout: One level Prior Function Level of Independence:  Independent Communication Communication: No difficulties    Cognition  Cognition Arousal/Alertness: Awake/alert Behavior During Therapy: Agitated (at times due to pain) Overall Cognitive Status: Within Functional Limits for tasks assessed    Extremity/Trunk Assessment Upper Extremity Assessment Upper Extremity Assessment: Defer to OT evaluation Lower Extremity Assessment Lower Extremity Assessment: LLE deficits/detail LLE Deficits / Details: Decreased strength and AROM consistent with tibial plateau fracture. Significant pain LLE: Unable to fully assess due to pain;Unable to fully assess due to immobilization Cervical / Trunk Assessment Cervical / Trunk Assessment: Normal   Balance Balance Balance Assessed: Yes Static Sitting Balance Static Sitting - Balance Support: Bilateral upper extremity supported;Feet supported Static Sitting - Level of Assistance: 5: Stand by assistance Static Sitting - Comment/# of Minutes: 5  End of Session PT - End of Session Equipment Utilized During Treatment: Gait belt Activity Tolerance: Patient limited by pain Patient left: in bed;with call bell/phone within reach Nurse Communication: Mobility status  GP     Jolyn Lent 08/16/2013, 4:00 PM  Jolyn Lent, PT, DPT Acute Rehabilitation Services

## 2013-08-17 DIAGNOSIS — M79609 Pain in unspecified limb: Secondary | ICD-10-CM | POA: Diagnosis not present

## 2013-08-17 DIAGNOSIS — N390 Urinary tract infection, site not specified: Secondary | ICD-10-CM

## 2013-08-17 DIAGNOSIS — S82109A Unspecified fracture of upper end of unspecified tibia, initial encounter for closed fracture: Secondary | ICD-10-CM | POA: Diagnosis not present

## 2013-08-17 DIAGNOSIS — R52 Pain, unspecified: Secondary | ICD-10-CM | POA: Diagnosis not present

## 2013-08-17 DIAGNOSIS — I10 Essential (primary) hypertension: Secondary | ICD-10-CM | POA: Diagnosis not present

## 2013-08-17 MED ORDER — OXYCODONE HCL 10 MG PO TABS: 10.0000 mg | ORAL_TABLET | ORAL | Status: DC | PRN

## 2013-08-17 MED ORDER — OXYCODONE HCL ER 10 MG PO T12A: 20.0000 mg | EXTENDED_RELEASE_TABLET | Freq: Two times a day (BID) | ORAL | Status: DC

## 2013-08-17 MED ORDER — ENOXAPARIN SODIUM 40 MG/0.4ML ~~LOC~~ SOLN: 40.0000 mg | SUBCUTANEOUS | Status: DC

## 2013-08-17 MED ORDER — OXYCODONE HCL ER 20 MG PO T12A: 20.0000 mg | EXTENDED_RELEASE_TABLET | Freq: Two times a day (BID) | ORAL | Status: DC

## 2013-08-17 NOTE — Progress Notes (Signed)
  Date: 08/17/2013  Patient name: Elizabeth [name]  Medical record number: [id number]  Date of birth: February 19, 1956   This patient has been seen and the plan of care was discussed with the house staff. Please see their note for complete details. I concur with their findings with the following additions/corrections:  Pain is better controlled, but still admits to being in pain.  SNF is recommended. She wants to talk to her sister before she makes up her mind. Ortho will plan surgery.  Increase Oxycontin to 20 mg PO bid and continued oxycodone for break through pain.   She is stable for discharge from my point of view and recommend SNF.  Dominic Pea, DO, [facility] 08/17/2013, 1:00 PM

## 2013-08-17 NOTE — Progress Notes (Signed)
Subjective: Patient feels as though her pain is more well controlled today on her new pain regimen, though she still is having some pain. PT/OT saw patient yesterday and they recommend SNF until her surgery on Tuesday. Patient has already had bed offers from multiple SNFs, but now she is unsure whether or not she wants to go to one. She is going to talk with her sister today about where she will go, either home or the SNF.  Objective: Vital signs in last 24 hours: Filed Vitals:   08/16/13 0609 08/16/13 1357 08/16/13 2023 08/17/13 0545  BP: 126/78 132/78 143/94 138/87  Pulse: 93 84 89 89  Temp: 99.5 F (37.5 C) 98.5 F (36.9 C) 100.1 F (37.8 C) 99.1 F (37.3 C)  TempSrc: Oral Oral Oral Oral  Resp: _0 Height:      Weight:      SpO2: 92% 95% 93% 90%   Weight change:   Intake/Output Summary (Last 24 hours) at 08/17/13 1013 Last data filed at 08/17/13 0943  Gross per 24 hour  Intake    360 ml  Output    150 ml  Net    210 ml   Physical Exam General: alert, cooperative, and in no apparent distress HEENT: NCAT, vision grossly intact Neck: supple Lungs: clear to ascultation bilaterally, normal work of respiration, no wheezes, rales, ronchi Heart: regular rate and rhythm, no murmurs, gallops, or rubs Abdomen: soft, non-tender, non-distended, normal bowel sounds Extremities: 2+ DP/PT pulses bilaterally, no cyanosis, clubbing, or edema; pain to L knee, did not assess ROM given knee immobilizer in place Neurologic: alert & oriented X3, cranial nerves II-XII grossly intact, strength grossly intact, sensation intact to light touch  Lab Results: Basic Metabolic Panel:  Recent Labs Lab 08/15/13 0450 08/16/13 0415  NA 139 137  K 3.7 3.8  CL 103 100  CO2 24 26  GLUCOSE 112* 105*  BUN 15 11  CREATININE 0.81 0.79  CALCIUM 8.9 9.2  MG 2.2  --    CBC:  Recent Labs Lab 08/14/13 2300  WBC 9.1  NEUTROABS 6.0  HGB 12.3  HCT 39.1  MCV 86.7  PLT 272    Coagulation:  Recent Labs Lab 08/15/13 0705  LABPROT 12.2  INR 0.92   Urine Drug Screen: Drugs of Abuse     Component Value Date/Time   LABOPIA NONE DETECTED 08/14/2013 2215   COCAINSCRNUR NONE DETECTED 08/14/2013 2215   LABBENZ NONE DETECTED 08/14/2013 2215   AMPHETMU NONE DETECTED 08/14/2013 2215   THCU NONE DETECTED 08/14/2013 2215   LABBARB NONE DETECTED 08/14/2013 2215    Alcohol Level:  Recent Labs Lab 08/15/13 0450  ETH <11   Urinalysis:  Recent Labs Lab 08/14/13 2215  COLORURINE YELLOW  LABSPEC 1.010  PHURINE 6.5  GLUCOSEU NEGATIVE  HGBUR TRACE*  BILIRUBINUR NEGATIVE  KETONESUR NEGATIVE  PROTEINUR NEGATIVE  UROBILINOGEN 0.2  NITRITE NEGATIVE  LEUKOCYTESUR MODERATE*    Micro Results: Recent Results (from the past 240 hour(s))  URINE CULTURE     Status: None   Collection Time    08/14/13 10:15 PM      Result Value Range Status   Specimen Description URINE, CLEAN CATCH   Final   Special Requests CX ADDED AT 2241 ON [id number]   Final   Culture  Setup Time     Final   Value: 08/14/2013 23:18     Performed at [facility]  Final   Value: NO GROWTH     Performed at [facility]   Culture     Final   Value: NO GROWTH     Performed at [facility]   Report Status 08/16/2013 FINAL   Final   Studies/Results: No results found. Medications: I have reviewed the patient's current medications. Scheduled Meds: . atorvastatin  40 mg Oral q1800  . carvedilol  6.25 mg Oral BID WC  . enoxaparin (LOVENOX) injection  40 mg Subcutaneous Q24H  . furosemide  20 mg Oral Daily  . OxyCODONE  15 mg Oral Q12H  . pantoprazole  40 mg Oral Daily  . sodium chloride  3 mL Intravenous Q12H   Continuous Infusions:  PRN Meds:.ondansetron (ZOFRAN) IV, ondansetron, oxyCODONE  Assessment/Plan: Left tibial plateau fracture- comminuted and depressed fracture also with nondisplaced transverse fracture through fibular head after fall.  UDS  negative. -strict bed rest (non-weight bearing)  -PT/OT/SW/CM- working on SNF placement -heart diet -ortho consulted- will do surgery on Tuesday next week; recommend HH vs SNF -further increase pain regimen to oxycontin 58m BID w/ Oxy IR [id number] prn for breakthrough -Zofran prn nausea  -K low yesterday at 3.3, supped with Kdur 4021monce and yesterday K 3.8; did not check labs this AM -will continue lovenox for DVT ppx at discharge (will hold lovenox on morning of surgery)   #Possible UTI- Pt denies symptoms of UTI (no dysuria, frequency, hematuria). UA showed moderate LE. Asymptomatic, no comorbidities so will not treat for now.  -urine culture- NGTD x 3 days  #HTN- BP 160s/90s on admission. She had not been taking home medications due to financial constraints and was in acute pain. We added back on her home medications per below and her BPs have stabilized (130s-140s/80s-90s). -continue home meds- carvedilol 6.25 mg BID, furosemide 20 mg daily   #HL  -continue rosuvastatin 40 mg daily   #CAD- stable; pt has not been taking carvedilol or ASA at home  -continue carvedilol, holding ASA for now since she was told she may have surgery  -will continue holding ASA at discharge given surgery on Tuesday  #DVT PPX- lovenox  Dispo: Disposition is deferred at this time, awaiting improvement of current medical problems.  Anticipated discharge in approximately 1 day(s).   The patient does have a current PCP (ShiMadilyn FiremanD) and [facility] after [facility].  The patient does not have transportation limitations that hinder transportation to clinic appointments.  .Services Needed at time of discharge: Y = Yes, Blank = No PT:   OT:   RN:   Equipment:   Other:     LOS: 3 days   RacRebecca EatonD 08/17/2013, 10:13 AM

## 2013-08-17 NOTE — Progress Notes (Signed)
Physical Therapy Treatment Patient Details Name: Elizabeth [name] MRN: [id number] DOB: [birth year] Today's Date: 08/17/2013 Time: 2395-3202 PT Time Calculation (min): 30 min  PT Assessment / Plan / Recommendation  History of Present Illness Pt is a 57 y/o female admitted for tibial plateau fracture after falling off her neighbor's porch. ORIF planned for August 22, 2013.    PT Comments   Pt reports she may have someone available to her when she goes home, but is adamantly refusing SNF at this time. Therapist strongly suggested that either her sister or her friend stay with her at least the first 24 hours for safety, as pt still required assistance with functional mobility during session.   Follow Up Recommendations  Home health PT     Does the patient have the potential to tolerate intense rehabilitation     Barriers to Discharge        Equipment Recommendations  Rolling walker with 5" wheels;3in1 (PT)    Recommendations for Other Services    Frequency Min 5X/week   Progress towards PT Goals Progress towards PT goals: Progressing toward goals  Plan Current plan remains appropriate    Precautions / Restrictions Precautions Precautions: Fall;Knee Required Braces or Orthoses: Knee Immobilizer - Left Restrictions Weight Bearing Restrictions: Yes LLE Weight Bearing: Non weight bearing   Pertinent Vitals/Pain Pt reports 8/10 pain at rest.    Mobility  Bed Mobility Bed Mobility: Supine to Sit;Sitting - Scoot to Edge of Bed Supine to Sit: 4: Min assist;With rails;HOB elevated Sitting - Scoot to [employer] of Bed: 4: Min assist;With rail Details for Bed Mobility Assistance: Increased time required to perform transfer due to pain. Pt was cued for sequencing and safety awareness. Transfers Transfers: Sit to Stand;Stand to Sit Sit to Stand: 4: Min assist;With upper extremity assist;From bed Sit to Stand: Patient Percentage: 70% Stand to Sit: 4: Min guard;To bed;To  chair/3-in-1 Details for Transfer Assistance: Pt able to perform transfers while keeping NWB status on L side. VC's for hand placement on seated surface prior to initiation of transfers. Ambulation/Gait Ambulation/Gait Assistance: 4: Min guard Ambulation Distance (Feet): 30 Feet Assistive device: Rolling walker Ambulation/Gait Assistance Details: VC's to maintain NWB status on L, and for sequencing and safety awareness with the walker, especially during turns. Gait Pattern: Step-to pattern Gait velocity: decreased General Gait Details: Pt fatigued quickly Stairs: No    Exercises     PT Diagnosis:    PT Problem List:   PT Treatment Interventions:     PT Goals (current goals can now be found in the care plan section) Acute Rehab PT Goals Patient Stated Goal: To stop hurting PT Goal Formulation: With patient Time For Goal Achievement: 08/23/13 Potential to Achieve Goals: Fair  Visit Information  Last PT Received On: 08/17/13 Assistance Needed: +2 (for safety) Reason Eval/Treat Not Completed: Pain limiting ability to participate History of Present Illness: Pt is a 57 y/o female admitted for tibial plateau fracture after falling off her neighbor's porch. ORIF planned for August 22, 2013.     Subjective Data  Patient Stated Goal: To stop hurting   Cognition  Cognition Arousal/Alertness: Awake/alert Behavior During Therapy: Anxious Overall Cognitive Status: Within Functional Limits for tasks assessed    Balance  Balance Balance Assessed: Yes Static Sitting Balance Static Sitting - Balance Support: Bilateral upper extremity supported;Feet supported Static Sitting - Level of Assistance: 5: Stand by assistance Static Sitting - Comment/# of Minutes: 3  End of Session PT - End of Session Equipment  Utilized During Treatment: Gait belt Activity Tolerance: Patient limited by pain Patient left: Other (comment);with nursing/sitter in room (Seated in w/c ready for d/c) Nurse  Communication: Mobility status   GP     Jolyn Lent 08/17/2013, 5:24 PM  Jolyn Lent, PT, DPT Acute Rehabilitation Services

## 2013-08-17 NOTE — Discharge Summary (Signed)
Name: Elizabeth [name] MRN: [id number] DOB: Oct 23, 1956 57 y.o. PCP: [name], MD  Date of Admission: 08/14/2013  8:01 PM Date of Discharge: 08/17/2013 Attending Physician: Dr. Murlean Caller  Discharge Diagnosis: Principal Problem:   Fracture of left tibial plateau Active Problems:   Fall  Discharge Medications:   Medication List    STOP taking these medications       aspirin EC 81 MG tablet      TAKE these medications       carvedilol 6.25 MG tablet  Commonly known as:  COREG  Take 1 tablet (6.25 mg total) by mouth 2 (two) times daily.     enoxaparin 40 MG/0.4ML injection  Commonly known as:  LOVENOX  Inject 0.4 mLs (40 mg total) into the skin daily.     furosemide 20 MG tablet  Commonly known as:  LASIX  Take 1 tablet (20 mg total) by mouth daily.     omeprazole 20 MG tablet  Commonly known as:  PRILOSEC OTC  Take 1 tablet (20 mg total) by mouth daily.     Oxycodone HCl 10 MG Tabs  Take 1 tablet (10 mg total) by mouth every 4 (four) hours as needed.     OxyCODONE 20 mg T12a 12 hr tablet  Commonly known as:  OXYCONTIN  Take 1 tablet (20 mg total) by mouth every 12 (twelve) hours.     rosuvastatin 40 MG tablet  Commonly known as:  CRESTOR  Take 1 tablet (40 mg total) by mouth daily.     zolpidem 10 MG tablet  Commonly known as:  AMBIEN  Take 1 tablet (10 mg total) by mouth at bedtime as needed for sleep.        Disposition and follow-up:   Elizabeth [name] was discharged from [facility] in Stable condition.  At the hospital follow up visit please address:  1.  Tibial plateau fracture- patient is scheduled for surgery on October 7th with orthopaedics, please assess her functional status and whether or not she needs more help at home after the surgery  2.  Labs / imaging needed at time of follow-up: none  3.  Pending labs/ test needing follow-up: none  Follow-up Appointments:  Patient is scheduled for surgery on Tuesday,  October 7th. They will call her.   Follow-up Information   Follow up with Renette Butters, MD On 08/22/2013.   Specialty:  Orthopedic Surgery   Contact information:   [address] [id number] [phone]       Follow up with Elnora Morrison, MD On 09/07/2013. (10:30am)    Specialty:  Internal Medicine   Contact information:   [place] [address] [phone]       Discharge Instructions: Discharge Orders   Future Appointments Provider Department Dept Phone   09/07/2013 10:00 AM Hester Mates, MD [place] [phone]   Future Orders Complete By Expires   Call MD for:  extreme fatigue  As directed    Call MD for:  persistant dizziness or light-headedness  As directed    Call MD for:  severe uncontrolled pain  As directed    Diet - low sodium heart healthy  As directed    Increase activity slowly  As directed       Consultations:   Renette Butters, MD- orthopaedics   Procedures Performed:  Dg Chest 2 View  08/14/2013   CLINICAL DATA:  Preop.  EXAM: CHEST  2 VIEW  COMPARISON:  05/12/2013  FINDINGS: Heart it is borderline in size. Lungs are clear. No effusions or acute bony abnormality.  IMPRESSION: No acute cardiopulmonary disease.   Electronically Signed   By: Rolm Baptise M.D.   On: 08/14/2013 22:14   Dg Knee 2 Views Left  08/14/2013   CLINICAL DATA:  Fall, knee pain.  EXAM: LEFT KNEE - 1-2 VIEW  COMPARISON:  None.  FINDINGS: FRACTURE NOTED THROUGH THE PROXIMAL TIBIA EXTENDING ACROSS THE ENTIRE PROXIMAL TIBIA METAPHYSIS. THERE IS PRESUMED EXTENSION TO THE KNEE JOINT AS THERE IS A LARGE JOINT EFFUSION, BUT DIFFICULT TO SEE ON PLAIN FILMS WITH THE LIMITED POSITIONING DUE TO THE PATIENT'S CONDITION. NO VISIBLE FEMUR OR FIBULA ABNORMALITY.  IMPRESSION: PROXIMAL TIBIAL FRACTURE WITH PRESUMED EXTENSION INTO THE JOINT SPACE. LARGE JOINT EFFUSION.   Electronically Signed   By: Rolm Baptise M.D.   On: 08/14/2013  21:20   Ct Knee Left Wo Contrast  08/15/2013   CLINICAL DATA:  [place]. Injured knee. Evaluate tibial plateau fracture.  EXAM: CT OF THE LEFT KNEE WITHOUT CONTRAST  TECHNIQUE: Multidetector CT imaging was performed according to the standard protocol. Multiplanar CT image reconstructions were also generated.  COMPARISON:  Radiographs 08/14/2013.  FINDINGS: There is a comminuted and depressed tibial plateau fracture. The main component is a transverse fracture involving both the medial and lateral plateau. The lateral portion is not significantly displaced or depressed. The medial component does demonstrate depression and impaction along the medial cortex. There is also a transverse fracture through the fibular head. The femur is intact. The patella is normal.  There is an associated lipohemarthrosis. The cruciate and collateral ligaments appear to be intact. The patellar and quadriceps tendons are intact.  IMPRESSION: Comminuted and depressed tibial plateau fracture more significantly involving the medial plateau and head.  Nondisplaced transverse fracture through the fibular head.   Electronically Signed   By: Kalman Jewels M.D.   On: 08/15/2013 00:10    Admission HPI: Elizabeth [name] is a 57 year old woman with history of HTN, HL, CAD (s/p NSTEMI with BMS to LCx in 2012), lupus, GERD, fibromyalgia, depression, cocaine abuse who presents to the ED with left knee pain after she fell off her neighbor's porch on 9/29 in a "freak accident." She states that she could not find her phone so she had her friend call it and when she heard it, she just fell off the one step porch. She felt her left leg twisting and "felt the bone moving" but no pop. She immediately came to the ED after. She has noticed both swelling and tingling in her left foot. She had not taken any sedating medications and had only had one beer prior to the accident.   In ED, she had left knee xrays followed by left knee CT which showed a comminuted and  depressed tibial plateau fracture more significantly involving the medial plateau and head. She has received dilaudid 2 mg IV, Fentanyl 15 mcg, and Percocet 5-325 mg x2. She thinks the Percocet has helped the most of the three with her pain.   She was ready to be discharged home with outpatient orthopedic surgery (Dr. Percell Miller) follow-up, but she did not feel comfortable going home.   Denies right leg pain, left or right hip pain, denies LOC or hitting her head.   Patient had UA in ED and also appears to have asymptomatic UTI as she denies dysuria, frequency, hematuria, fevers.  Hospital Course by problem list:   Left tibial plateau fracture- Patient presented with comminuted and depressed fracture of tibial plateau with nondisplaced transverse fracture through fibular head after fall. Patient was in significant pain and admitted primarily for pain control. She was seen by orthopaedics who recommended strict bed rest and scheduled her for surgery next Tuesday, August 22, 2013. Her pain regimen was altered multiple times during her stay. We started with percocet, then ultimately increased to oxycontin 33m BID w/ oxycodone IR [id number] for breakthrough, which seemed to control the pain fairly well. Patient was managed on lovenox for DVT ppx and was discharged with four days worth of lovenox to take at home. Patient instructed not to take lovenox on morning of surgery. PT/OT evaluated patient and both recommended SNF; however, patient refused SNF placement despite our best effort to encourage this option. [facility] and an aid to assist her. She lives alone, though reports her sister can assist her at home as well. I discharged her with  oxycontin 2047mID (#30)w/ Oxy IR 20m54mh prn (#50) for breakthrough. During her stay, UDS negative. Potassium low once at 3.3, which was supplemented with Kdur 40mE32mce, repeat K 3.8.   #Possible UTI- Pt denied symptoms of UTI,  though UA showed moderate leukocyte esterase. Urine culture- NGTD x 3 days. Did not treat.   #HTN- BP 160s/90s on admission. She had not been taking home medications due to financial constraints and was in acute pain. We added back on her home medications (carvedilol 6.25 mg BID, furosemide 20 mg daily) and her BPs stabilized (130s-140s/80s-90s).   #HLD- Continued rosuvastatin 40 mg daily   #CAD- Stable. Pt has not been taking carvedilol or ASA at home 2/2 financial constraints. We continued her carvedilol, held ASA given she will have surgery next week.   #DVT PPX- lovenox  Discharge Vitals:   BP 137/85  Pulse 83  Temp(Src) 99 F (37.2 C) (Oral)  Resp 20  Ht 5' 3.6" (1.615 m)  Wt 80 kg (176 lb 5.9 oz)  BMI 30.67 kg/m2  SpO2 93%  LMP 12/30/1994  Discharge Labs:  No results found for this or any previous visit (from the past 24 hour(s)).  Signed: RacheRebecca Eaton10/12/2012, 7:06 PM   Time Spent on Discharge: 35 minutes Services Ordered on Discharge: HH PTHartleyT/Aid Equipment Ordered on Discharge: bedside commode, rolling walker

## 2013-08-17 NOTE — Progress Notes (Signed)
Patient discharged home with sister.  Patient has prescriptions for medication.  Patient was taught about administration of lovenox at home and provided return demonstration.  Discharge instructions reviewed and patient verbalized understanding regarding weight bearing restrictions and medication compliance.  IV removed.  Skin intact and patient left floor in stable condition.

## 2013-08-17 NOTE — Clinical Social Work Note (Signed)
Patient refused SNF and will DC home with [facility] services. Sister plans to look after patient. CSW signing off.    Liz Beach, [place], [place], [phone]

## 2013-08-18 NOTE — Discharge Summary (Signed)
  Date: 08/18/2013  Patient name: Elizabeth [name]  Medical record number: [id number]  Date of birth: October 29, 1956   This patient has been seen and the plan of care was discussed with the house staff. Please see their note for complete details. I concur with their findings and plan.  Dominic Pea, DO, [facility] 08/18/2013, 3:35 PM

## 2013-08-20 DIAGNOSIS — R269 Unspecified abnormalities of gait and mobility: Secondary | ICD-10-CM | POA: Diagnosis not present

## 2013-08-20 DIAGNOSIS — F329 Major depressive disorder, single episode, unspecified: Secondary | ICD-10-CM | POA: Diagnosis not present

## 2013-08-20 DIAGNOSIS — S8290XD Unspecified fracture of unspecified lower leg, subsequent encounter for closed fracture with routine healing: Secondary | ICD-10-CM | POA: Diagnosis not present

## 2013-08-20 DIAGNOSIS — M25569 Pain in unspecified knee: Secondary | ICD-10-CM | POA: Diagnosis not present

## 2013-08-20 DIAGNOSIS — I2510 Atherosclerotic heart disease of native coronary artery without angina pectoris: Secondary | ICD-10-CM | POA: Diagnosis not present

## 2013-08-21 ENCOUNTER — Encounter (HOSPITAL_COMMUNITY): Payer: Self-pay | Admitting: Vascular Surgery

## 2013-08-21 MED ORDER — ACETAMINOPHEN 325 MG PO TABS
1000.0000 mg | ORAL_TABLET | Freq: Once | ORAL | Status: AC
  Administered 2013-08-22: 975 mg via ORAL
  Filled 2013-08-21: qty 3

## 2013-08-21 MED ORDER — DEXTROSE 5 % IV SOLN: 2.0000 g | INTRAVENOUS | Status: DC

## 2013-08-21 NOTE — Progress Notes (Signed)
Anesthesia Chart Review:  Patient is a 57 year old female scheduled for ORIF of left tibial plateau fracture on 08/22/13. She is scheduled to be a same day work-up--since was hospitalized for a fall with left tibial plateau fracture 08/14/13-08/17/13.  She was started on Lovenox for DVT prophylaxis per Dr. Percell Miller. She was admitted by the IM Residency Service, who also follows her for primary care.  They are aware of plans for surgery. So far today, our PAT RN has not been able to get a hold of patient by telephone for her PAT interview.  History includes smoking, HTN, CAD/NSTEMI 08/2011 (in the setting of cocaine use) s/p BMS CFX 08/21/11, cocaine abuse (negative UDS 08/14/13), microcytic anemia, GERD, depression, fibromyalgia, hysterectomy, + ANA/SLE 2009, pulmonary nodules '09. She had an ED evaluation 05/12/13 for CP and ruled out for MI.  She has been evaluated by IM on several occasions since, but not cardiology. She denied chest pain and SOB during her admission last week.  Her primary cardiologist is Dr. Verl Blalock, but she has not been evaluated since 10/29/11 Richardson Dopp, PA-C). Current medication list includes Coreg, Lasix, Prilosec, Crestor. (ASA placed on hold for surgery.)  Cardiac cath on 08/21/11 showed: Normal left main. Mild diffuse irregularities in the LAD with 60-70% stenosis in the mid LAD and diffuse disease in the distal vessel. The left circumflex was occluded proximally with dye hang-up suggestive of acute thrombosis. Right coronary was ccluded proximally with the distal RCA filling via left-to-right ollaterals. EF was 40-45% with inferior akinesis. Films reviewed and the left circumflex was felt to be the infarct vessel. This was successfully stented with placement of a 3.0 x 22-mm Integrity bare-metal stent.   Echo on 08/23/11 showed mild to moderate LVH with disproportionate septal thickening, normal LV systolic function, EF 55, mild to moderate hypokinesis of the basal inferolateral and  inferior myocardium.  No ASD or PFO identified. Trivial MR, mild TR.  EKG on 08/14/13 showed SR, prolonged QT, non-specific anterolateral T wave abnormality.  T wave abnormality is less pronounced than on her EKGs from 05/12/13.  CXR on 08/14/13 showed no acute cardiopulmonary disease.  Labs from 08/14/13 and 08/16/13 noted.  Patient has a history of BMS to CX two years ago.  She has not had recent cardiology follow-up, but has had fairly consistent IM follow-up--which knowledge of her plans for surgery tomorrow.  She denied any CV symptoms during her recent admission, and EKG was stable at that time.  Reviewed with anesthesiologist Dr. Conrad Northfield.  Patient will be further evaluated by her assigned anesthesiologist on the day of surgery, but if no acute change/new CV symptoms then it is anticipated that she can proceed with ORIF.  George Hugh [facility] Stay [facility]/Anesthesiology Phone [phone] 08/21/2013 3:30 PM

## 2013-08-22 ENCOUNTER — Ambulatory Visit (HOSPITAL_COMMUNITY): Payer: Medicare Other

## 2013-08-22 ENCOUNTER — Encounter (HOSPITAL_COMMUNITY)
Admission: RE | Disposition: A | Discharge status: Home Health | Payer: Self-pay | Source: Ambulatory Visit | Attending: Orthopedic Surgery

## 2013-08-22 ENCOUNTER — Inpatient Hospital Stay (HOSPITAL_COMMUNITY): Payer: Medicare Other

## 2013-08-22 ENCOUNTER — Encounter (HOSPITAL_COMMUNITY): Payer: Self-pay

## 2013-08-22 ENCOUNTER — Encounter (HOSPITAL_COMMUNITY): Payer: Self-pay | Admitting: Vascular Surgery

## 2013-08-22 ENCOUNTER — Ambulatory Visit (HOSPITAL_COMMUNITY): Payer: Medicare Other | Admitting: Vascular Surgery

## 2013-08-22 ENCOUNTER — Inpatient Hospital Stay (HOSPITAL_COMMUNITY)
Admission: RE | Admit: 2013-08-22 | Discharge: 2013-08-24 | DRG: 492 | Disposition: A | Discharge status: Home Health | Payer: Medicare Other | Source: Ambulatory Visit | Attending: Orthopedic Surgery | Admitting: Orthopedic Surgery

## 2013-08-22 DIAGNOSIS — M329 Systemic lupus erythematosus, unspecified: Secondary | ICD-10-CM | POA: Diagnosis present

## 2013-08-22 DIAGNOSIS — F1410 Cocaine abuse, uncomplicated: Secondary | ICD-10-CM | POA: Diagnosis present

## 2013-08-22 DIAGNOSIS — F1010 Alcohol abuse, uncomplicated: Secondary | ICD-10-CM | POA: Diagnosis present

## 2013-08-22 DIAGNOSIS — F3289 Other specified depressive episodes: Secondary | ICD-10-CM | POA: Diagnosis present

## 2013-08-22 DIAGNOSIS — IMO0002 Reserved for concepts with insufficient information to code with codable children: Secondary | ICD-10-CM | POA: Diagnosis not present

## 2013-08-22 DIAGNOSIS — J9600 Acute respiratory failure, unspecified whether with hypoxia or hypercapnia: Secondary | ICD-10-CM

## 2013-08-22 DIAGNOSIS — S82209A Unspecified fracture of shaft of unspecified tibia, initial encounter for closed fracture: Secondary | ICD-10-CM | POA: Diagnosis not present

## 2013-08-22 DIAGNOSIS — I255 Ischemic cardiomyopathy: Secondary | ICD-10-CM

## 2013-08-22 DIAGNOSIS — I5023 Acute on chronic systolic (congestive) heart failure: Secondary | ICD-10-CM | POA: Diagnosis not present

## 2013-08-22 DIAGNOSIS — J4489 Other specified chronic obstructive pulmonary disease: Secondary | ICD-10-CM | POA: Diagnosis present

## 2013-08-22 DIAGNOSIS — J810 Acute pulmonary edema: Secondary | ICD-10-CM | POA: Diagnosis not present

## 2013-08-22 DIAGNOSIS — S82109A Unspecified fracture of upper end of unspecified tibia, initial encounter for closed fracture: Secondary | ICD-10-CM | POA: Diagnosis not present

## 2013-08-22 DIAGNOSIS — I2510 Atherosclerotic heart disease of native coronary artery without angina pectoris: Secondary | ICD-10-CM

## 2013-08-22 DIAGNOSIS — F17200 Nicotine dependence, unspecified, uncomplicated: Secondary | ICD-10-CM | POA: Diagnosis present

## 2013-08-22 DIAGNOSIS — K219 Gastro-esophageal reflux disease without esophagitis: Secondary | ICD-10-CM | POA: Diagnosis not present

## 2013-08-22 DIAGNOSIS — J9601 Acute respiratory failure with hypoxia: Secondary | ICD-10-CM

## 2013-08-22 DIAGNOSIS — F329 Major depressive disorder, single episode, unspecified: Secondary | ICD-10-CM | POA: Diagnosis present

## 2013-08-22 DIAGNOSIS — J449 Chronic obstructive pulmonary disease, unspecified: Secondary | ICD-10-CM | POA: Diagnosis present

## 2013-08-22 DIAGNOSIS — I2589 Other forms of chronic ischemic heart disease: Secondary | ICD-10-CM

## 2013-08-22 DIAGNOSIS — I509 Heart failure, unspecified: Secondary | ICD-10-CM

## 2013-08-22 DIAGNOSIS — D62 Acute posthemorrhagic anemia: Secondary | ICD-10-CM | POA: Diagnosis present

## 2013-08-22 DIAGNOSIS — S82142A Displaced bicondylar fracture of left tibia, initial encounter for closed fracture: Secondary | ICD-10-CM

## 2013-08-22 DIAGNOSIS — Y92009 Unspecified place in unspecified non-institutional (private) residence as the place of occurrence of the external cause: Secondary | ICD-10-CM

## 2013-08-22 DIAGNOSIS — R0600 Dyspnea, unspecified: Secondary | ICD-10-CM

## 2013-08-22 DIAGNOSIS — E785 Hyperlipidemia, unspecified: Secondary | ICD-10-CM | POA: Diagnosis present

## 2013-08-22 DIAGNOSIS — I10 Essential (primary) hypertension: Secondary | ICD-10-CM | POA: Diagnosis not present

## 2013-08-22 DIAGNOSIS — G8918 Other acute postprocedural pain: Secondary | ICD-10-CM | POA: Diagnosis not present

## 2013-08-22 DIAGNOSIS — Z9119 Patient's noncompliance with other medical treatment and regimen: Secondary | ICD-10-CM

## 2013-08-22 DIAGNOSIS — Z79899 Other long term (current) drug therapy: Secondary | ICD-10-CM

## 2013-08-22 DIAGNOSIS — Z7982 Long term (current) use of aspirin: Secondary | ICD-10-CM

## 2013-08-22 DIAGNOSIS — W1789XA Other fall from one level to another, initial encounter: Secondary | ICD-10-CM | POA: Diagnosis present

## 2013-08-22 DIAGNOSIS — I161 Hypertensive emergency: Secondary | ICD-10-CM

## 2013-08-22 DIAGNOSIS — R0989 Other specified symptoms and signs involving the circulatory and respiratory systems: Secondary | ICD-10-CM | POA: Diagnosis not present

## 2013-08-22 DIAGNOSIS — Z91199 Patient's noncompliance with other medical treatment and regimen due to unspecified reason: Secondary | ICD-10-CM

## 2013-08-22 DIAGNOSIS — I252 Old myocardial infarction: Secondary | ICD-10-CM | POA: Diagnosis present

## 2013-08-22 DIAGNOSIS — Z9861 Coronary angioplasty status: Secondary | ICD-10-CM

## 2013-08-22 DIAGNOSIS — R768 Other specified abnormal immunological findings in serum: Secondary | ICD-10-CM | POA: Diagnosis present

## 2013-08-22 DIAGNOSIS — E876 Hypokalemia: Secondary | ICD-10-CM | POA: Diagnosis present

## 2013-08-22 HISTORY — PX: ORIF TIBIA PLATEAU: SHX2132

## 2013-08-22 LAB — BLOOD GAS, ARTERIAL
Acid-Base Excess: 2.9 mmol/L — ABNORMAL HIGH (ref 0.0–2.0)
Drawn by: 257081
O2 Content: 3 L/min
O2 Saturation: 93.3 %
Patient temperature: 98.6
pCO2 arterial: 43.3 mmHg (ref 35.0–45.0)
pO2, Arterial: 67.2 mmHg — ABNORMAL LOW (ref 80.0–100.0)

## 2013-08-22 LAB — CBC
HCT: 34.5 % — ABNORMAL LOW (ref 36.0–46.0)
MCH: 28.6 pg (ref 26.0–34.0)
MCHC: 32.5 g/dL (ref 30.0–36.0)
MCV: 88 fL (ref 78.0–100.0)
Platelets: 320 10*3/uL (ref 150–400)
RDW: 15.3 % (ref 11.5–15.5)
WBC: 7.6 10*3/uL (ref 4.0–10.5)

## 2013-08-22 LAB — COMPREHENSIVE METABOLIC PANEL
AST: 20 U/L (ref 0–37)
Albumin: 2.9 g/dL — ABNORMAL LOW (ref 3.5–5.2)
BUN: 9 mg/dL (ref 6–23)
CO2: 28 mEq/L (ref 19–32)
Chloride: 102 mEq/L (ref 96–112)
Creatinine, Ser: 0.67 mg/dL (ref 0.50–1.10)
GFR calc non Af Amer: >90 mL/min (ref >90)
Total Bilirubin: 0.4 mg/dL (ref 0.3–1.2)
Total Protein: 7.2 g/dL (ref 6.0–8.3)

## 2013-08-22 LAB — POCT I-STAT 4, (NA,K, GLUC, HGB,HCT)
Glucose, Bld: 106 mg/dL — ABNORMAL HIGH (ref 70–99)
HCT: 36 % (ref 36.0–46.0)
Hemoglobin: 12.2 g/dL (ref 12.0–15.0)
Potassium: 3.9 mEq/L (ref 3.5–5.1)
Sodium: 142 mEq/L (ref 135–145)

## 2013-08-22 LAB — PRO B NATRIURETIC PEPTIDE: Pro B Natriuretic peptide (BNP): 554.5 pg/mL — ABNORMAL HIGH (ref 0–125)

## 2013-08-22 LAB — MRSA PCR SCREENING: MRSA by PCR: POSITIVE — AB

## 2013-08-22 LAB — TROPONIN I
Troponin I: <0.3 ng/mL (ref <0.30)
Troponin I: <0.3 ng/mL (ref <0.30)

## 2013-08-22 SURGERY — OPEN REDUCTION INTERNAL FIXATION (ORIF) TIBIAL PLATEAU
Anesthesia: General | Site: Knee | Laterality: Left | Wound class: Clean

## 2013-08-22 MED ORDER — MIDAZOLAM HCL 2 MG/2ML IJ SOLN
INTRAMUSCULAR | Status: AC
  Administered 2013-08-22: 2 mg
  Filled 2013-08-22: qty 2

## 2013-08-22 MED ORDER — DEXTROSE-NACL 5-0.45 % IV SOLN
INTRAVENOUS | Status: DC
  Filled 2013-08-22: qty 1000

## 2013-08-22 MED ORDER — PROMETHAZINE HCL 25 MG/ML IJ SOLN: 6.2500 mg | INTRAMUSCULAR | Status: DC | PRN

## 2013-08-22 MED ORDER — ACETAMINOPHEN 10 MG/ML IV SOLN
1000.0000 mg | Freq: Once | INTRAVENOUS | Status: AC
  Administered 2013-08-22: 1000 mg via INTRAVENOUS
  Filled 2013-08-22: qty 100

## 2013-08-22 MED ORDER — ALBUTEROL SULFATE (5 MG/ML) 0.5% IN NEBU: 2.5000 mg | INHALATION_SOLUTION | RESPIRATORY_TRACT | Status: DC | PRN

## 2013-08-22 MED ORDER — DEXTROSE-NACL 5-0.45 % IV SOLN: 100.0000 mL/h | INTRAVENOUS | Status: DC

## 2013-08-22 MED ORDER — ONDANSETRON HCL 4 MG PO TABS: 4.0000 mg | ORAL_TABLET | Freq: Four times a day (QID) | ORAL | Status: DC | PRN

## 2013-08-22 MED ORDER — LORAZEPAM 2 MG/ML IJ SOLN
1.0000 mg | INTRAMUSCULAR | Status: DC | PRN
  Administered 2013-08-22 – 2013-08-23 (×3): 2 mg via INTRAVENOUS
  Administered 2013-08-23 – 2013-08-24 (×2): 1 mg via INTRAVENOUS
  Filled 2013-08-22 (×5): qty 1

## 2013-08-22 MED ORDER — OXYCODONE HCL 5 MG PO TABS: 10.0000 mg | ORAL_TABLET | ORAL | Status: DC | PRN

## 2013-08-22 MED ORDER — BUPIVACAINE-EPINEPHRINE PF 0.5-1:200000 % IJ SOLN
INTRAMUSCULAR | Status: DC | PRN
  Administered 2013-08-22: 125 mg

## 2013-08-22 MED ORDER — ALBUTEROL SULFATE (5 MG/ML) 0.5% IN NEBU: 2.5000 mg | INHALATION_SOLUTION | Freq: Four times a day (QID) | RESPIRATORY_TRACT | Status: DC

## 2013-08-22 MED ORDER — LACTATED RINGERS IV SOLN
INTRAVENOUS | Status: DC | PRN
  Administered 2013-08-22 (×2): via INTRAVENOUS

## 2013-08-22 MED ORDER — LIDOCAINE HCL (CARDIAC) 20 MG/ML IV SOLN
INTRAVENOUS | Status: DC | PRN
  Administered 2013-08-22: 50 mg via INTRAVENOUS

## 2013-08-22 MED ORDER — HEPARIN (PORCINE) IN NACL 100-0.45 UNIT/ML-% IJ SOLN
900.0000 [IU]/h | INTRAMUSCULAR | Status: DC
  Filled 2013-08-22: qty 250

## 2013-08-22 MED ORDER — PANTOPRAZOLE SODIUM 40 MG PO TBEC
40.0000 mg | DELAYED_RELEASE_TABLET | Freq: Every day | ORAL | Status: DC
  Administered 2013-08-22 – 2013-08-24 (×3): 40 mg via ORAL
  Filled 2013-08-22 (×3): qty 1

## 2013-08-22 MED ORDER — HYDRALAZINE HCL 20 MG/ML IJ SOLN
10.0000 mg | INTRAMUSCULAR | Status: DC | PRN
  Administered 2013-08-23: 10 mg via INTRAVENOUS
  Filled 2013-08-22: qty 1

## 2013-08-22 MED ORDER — ZOLPIDEM TARTRATE 5 MG PO TABS: 10.0000 mg | ORAL_TABLET | Freq: Every evening | ORAL | Status: DC | PRN

## 2013-08-22 MED ORDER — POTASSIUM CHLORIDE CRYS ER 20 MEQ PO TBCR
40.0000 meq | EXTENDED_RELEASE_TABLET | Freq: Once | ORAL | Status: AC
  Administered 2013-08-22: 40 meq via ORAL
  Filled 2013-08-22: qty 2

## 2013-08-22 MED ORDER — HYDROMORPHONE HCL PF 1 MG/ML IJ SOLN
INTRAMUSCULAR | Status: AC
  Administered 2013-08-22: 1 mg via INTRAVENOUS
  Filled 2013-08-22: qty 1

## 2013-08-22 MED ORDER — HYDROMORPHONE HCL PF 1 MG/ML IJ SOLN
0.5000 mg | INTRAMUSCULAR | Status: DC | PRN
  Administered 2013-08-22 – 2013-08-23 (×5): 1 mg via INTRAVENOUS
  Filled 2013-08-22 (×5): qty 1

## 2013-08-22 MED ORDER — CARVEDILOL 6.25 MG PO TABS
6.2500 mg | ORAL_TABLET | Freq: Once | ORAL | Status: AC
  Administered 2013-08-22: 6.25 mg via ORAL

## 2013-08-22 MED ORDER — HYDROMORPHONE HCL PF 1 MG/ML IJ SOLN
INTRAMUSCULAR | Status: AC
  Administered 2013-08-22: 14:00:00
  Filled 2013-08-22: qty 1

## 2013-08-22 MED ORDER — OXYCODONE HCL 5 MG/5ML PO SOLN: 5.0000 mg | Freq: Once | ORAL | Status: DC | PRN

## 2013-08-22 MED ORDER — FENTANYL CITRATE 0.05 MG/ML IJ SOLN
INTRAMUSCULAR | Status: AC
  Administered 2013-08-22: 100 ug
  Filled 2013-08-22: qty 2

## 2013-08-22 MED ORDER — 0.9 % SODIUM CHLORIDE (POUR BTL) OPTIME
TOPICAL | Status: DC | PRN
  Administered 2013-08-22: 1000 mL

## 2013-08-22 MED ORDER — MIDAZOLAM HCL 2 MG/2ML IJ SOLN
0.5000 mg | INTRAMUSCULAR | Status: DC | PRN
  Administered 2013-08-22: 0.5 mg via INTRAVENOUS

## 2013-08-22 MED ORDER — OXYCODONE HCL 5 MG PO TABS
5.0000 mg | ORAL_TABLET | ORAL | Status: DC | PRN
  Administered 2013-08-22: 5 mg via ORAL
  Administered 2013-08-23 (×3): 10 mg via ORAL
  Filled 2013-08-22: qty 1
  Filled 2013-08-22 (×3): qty 2

## 2013-08-22 MED ORDER — DEXAMETHASONE SODIUM PHOSPHATE 4 MG/ML IJ SOLN
INTRAMUSCULAR | Status: DC | PRN
  Administered 2013-08-22: 4 mg via INTRAVENOUS

## 2013-08-22 MED ORDER — CHLORHEXIDINE GLUCONATE CLOTH 2 % EX PADS
6.0000 | MEDICATED_PAD | Freq: Every day | CUTANEOUS | Status: DC
  Administered 2013-08-23 – 2013-08-24 (×2): 6 via TOPICAL

## 2013-08-22 MED ORDER — ONDANSETRON HCL 4 MG/2ML IJ SOLN
INTRAMUSCULAR | Status: DC | PRN
  Administered 2013-08-22: 4 mg via INTRAMUSCULAR

## 2013-08-22 MED ORDER — CARVEDILOL 3.125 MG PO TABS
ORAL_TABLET | ORAL | Status: AC
  Filled 2013-08-22: qty 2

## 2013-08-22 MED ORDER — HYDROMORPHONE HCL PF 1 MG/ML IJ SOLN
0.2500 mg | INTRAMUSCULAR | Status: DC | PRN
  Administered 2013-08-22 (×2): 1 mg via INTRAVENOUS

## 2013-08-22 MED ORDER — ROCURONIUM BROMIDE 100 MG/10ML IV SOLN
INTRAVENOUS | Status: DC | PRN
  Administered 2013-08-22: 30 mg via INTRAVENOUS

## 2013-08-22 MED ORDER — ARTIFICIAL TEARS OP OINT
TOPICAL_OINTMENT | OPHTHALMIC | Status: DC | PRN
  Administered 2013-08-22: 1 via OPHTHALMIC

## 2013-08-22 MED ORDER — CEFAZOLIN SODIUM-DEXTROSE 2-3 GM-% IV SOLR
INTRAVENOUS | Status: AC
  Administered 2013-08-22: 2 g via INTRAVENOUS
  Filled 2013-08-22: qty 50

## 2013-08-22 MED ORDER — LACTATED RINGERS IV SOLN
INTRAVENOUS | Status: DC
  Administered 2013-08-22: 10:00:00 via INTRAVENOUS

## 2013-08-22 MED ORDER — ALBUTEROL SULFATE (5 MG/ML) 0.5% IN NEBU
2.5000 mg | INHALATION_SOLUTION | RESPIRATORY_TRACT | Status: AC
  Administered 2013-08-22: 2.5 mg via RESPIRATORY_TRACT
  Filled 2013-08-22: qty 0.5

## 2013-08-22 MED ORDER — FENTANYL CITRATE 0.05 MG/ML IJ SOLN
INTRAMUSCULAR | Status: DC | PRN
  Administered 2013-08-22: 50 ug via INTRAVENOUS
  Administered 2013-08-22: 100 ug via INTRAVENOUS
  Administered 2013-08-22: 50 ug via INTRAVENOUS
  Administered 2013-08-22 (×2): 25 ug via INTRAVENOUS

## 2013-08-22 MED ORDER — ATORVASTATIN CALCIUM 80 MG PO TABS
80.0000 mg | ORAL_TABLET | Freq: Every day | ORAL | Status: DC
  Administered 2013-08-22 – 2013-08-23 (×2): 80 mg via ORAL
  Filled 2013-08-22 (×3): qty 1

## 2013-08-22 MED ORDER — PROPOFOL 10 MG/ML IV BOLUS
INTRAVENOUS | Status: DC | PRN
  Administered 2013-08-22: 200 mg via INTRAVENOUS

## 2013-08-22 MED ORDER — ASPIRIN EC 325 MG PO TBEC: 325.0000 mg | DELAYED_RELEASE_TABLET | Freq: Every day | ORAL | Status: DC

## 2013-08-22 MED ORDER — OXYCODONE-ACETAMINOPHEN 5-325 MG PO TABS
ORAL_TABLET | ORAL | Status: AC
  Administered 2013-08-22: 14:00:00
  Filled 2013-08-22: qty 2

## 2013-08-22 MED ORDER — OXYCODONE-ACETAMINOPHEN 5-325 MG PO TABS
1.0000 | ORAL_TABLET | ORAL | Status: DC | PRN
  Administered 2013-08-22: 2 via ORAL

## 2013-08-22 MED ORDER — METOCLOPRAMIDE HCL 10 MG PO TABS
5.0000 mg | ORAL_TABLET | Freq: Three times a day (TID) | ORAL | Status: DC | PRN
  Filled 2013-08-22: qty 1

## 2013-08-22 MED ORDER — ASPIRIN 325 MG PO TABS
325.0000 mg | ORAL_TABLET | Freq: Every day | ORAL | Status: DC
  Administered 2013-08-23 – 2013-08-24 (×2): 325 mg via ORAL
  Filled 2013-08-22 (×3): qty 1

## 2013-08-22 MED ORDER — ALBUTEROL SULFATE (5 MG/ML) 0.5% IN NEBU
2.5000 mg | INHALATION_SOLUTION | Freq: Four times a day (QID) | RESPIRATORY_TRACT | Status: DC
  Filled 2013-08-22: qty 0.5

## 2013-08-22 MED ORDER — FUROSEMIDE 20 MG PO TABS
20.0000 mg | ORAL_TABLET | Freq: Every day | ORAL | Status: DC
  Administered 2013-08-23 – 2013-08-24 (×2): 20 mg via ORAL
  Filled 2013-08-22 (×2): qty 1

## 2013-08-22 MED ORDER — OXYCODONE HCL 5 MG PO TABS: 5.0000 mg | ORAL_TABLET | Freq: Once | ORAL | Status: DC | PRN

## 2013-08-22 MED ORDER — FUROSEMIDE 10 MG/ML IJ SOLN
40.0000 mg | Freq: Once | INTRAMUSCULAR | Status: AC
  Administered 2013-08-22: 40 mg via INTRAVENOUS
  Filled 2013-08-22: qty 4

## 2013-08-22 MED ORDER — CHLORHEXIDINE GLUCONATE 4 % EX LIQD: 60.0000 mL | Freq: Once | CUTANEOUS | Status: DC

## 2013-08-22 MED ORDER — NEOSTIGMINE METHYLSULFATE 1 MG/ML IJ SOLN
INTRAMUSCULAR | Status: DC | PRN
  Administered 2013-08-22: 4 mg via INTRAVENOUS

## 2013-08-22 MED ORDER — FOLIC ACID 5 MG/ML IJ SOLN
1.0000 mg | Freq: Every day | INTRAMUSCULAR | Status: DC
  Administered 2013-08-22 – 2013-08-23 (×2): 1 mg via INTRAVENOUS
  Filled 2013-08-22 (×3): qty 0.2

## 2013-08-22 MED ORDER — CEFAZOLIN SODIUM-DEXTROSE 2-3 GM-% IV SOLR
2.0000 g | Freq: Four times a day (QID) | INTRAVENOUS | Status: AC
  Administered 2013-08-22 – 2013-08-23 (×3): 2 g via INTRAVENOUS
  Filled 2013-08-22 (×5): qty 50

## 2013-08-22 MED ORDER — ALBUTEROL (5 MG/ML) CONTINUOUS INHALATION SOLN
2.5000 mg/h | INHALATION_SOLUTION | RESPIRATORY_TRACT | Status: DC
  Filled 2013-08-22: qty 20

## 2013-08-22 MED ORDER — METOCLOPRAMIDE HCL 5 MG/ML IJ SOLN
5.0000 mg | Freq: Three times a day (TID) | INTRAMUSCULAR | Status: DC | PRN
  Filled 2013-08-22: qty 2

## 2013-08-22 MED ORDER — ZOLPIDEM TARTRATE 5 MG PO TABS: 5.0000 mg | ORAL_TABLET | Freq: Every evening | ORAL | Status: DC | PRN

## 2013-08-22 MED ORDER — HYDROMORPHONE HCL PF 1 MG/ML IJ SOLN
INTRAMUSCULAR | Status: AC
  Filled 2013-08-22: qty 1

## 2013-08-22 MED ORDER — ONDANSETRON HCL 4 MG/2ML IJ SOLN: 4.0000 mg | Freq: Four times a day (QID) | INTRAMUSCULAR | Status: DC | PRN

## 2013-08-22 MED ORDER — GLYCOPYRROLATE 0.2 MG/ML IJ SOLN
INTRAMUSCULAR | Status: DC | PRN
  Administered 2013-08-22: .8 mg via INTRAVENOUS

## 2013-08-22 MED ORDER — MIDAZOLAM HCL 2 MG/2ML IJ SOLN
INTRAMUSCULAR | Status: AC
  Administered 2013-08-22: 0.5 mg via INTRAVENOUS
  Filled 2013-08-22: qty 2

## 2013-08-22 MED ORDER — MUPIROCIN 2 % EX OINT
1.0000 "application " | TOPICAL_OINTMENT | Freq: Two times a day (BID) | CUTANEOUS | Status: DC
  Administered 2013-08-22 – 2013-08-24 (×4): 1 via NASAL
  Filled 2013-08-22: qty 22

## 2013-08-22 MED ORDER — THIAMINE HCL 100 MG/ML IJ SOLN
100.0000 mg | Freq: Every day | INTRAMUSCULAR | Status: DC
  Administered 2013-08-22 – 2013-08-24 (×3): 100 mg via INTRAVENOUS
  Filled 2013-08-22 (×3): qty 1

## 2013-08-22 MED ORDER — CARVEDILOL 6.25 MG PO TABS
6.2500 mg | ORAL_TABLET | Freq: Two times a day (BID) | ORAL | Status: DC
  Administered 2013-08-22 – 2013-08-24 (×4): 6.25 mg via ORAL
  Filled 2013-08-22 (×7): qty 1

## 2013-08-22 MED ORDER — OMEPRAZOLE MAGNESIUM 20 MG PO TBEC: 20.0000 mg | DELAYED_RELEASE_TABLET | Freq: Every day | ORAL | Status: DC

## 2013-08-22 MED ORDER — HYDROMORPHONE HCL PF 1 MG/ML IJ SOLN
INTRAMUSCULAR | Status: DC | PRN
  Administered 2013-08-22 (×2): .5 mg via INTRAVENOUS

## 2013-08-22 SURGICAL SUPPLY — 70 items
APL SKNCLS STERI-STRIP NONHPOA (GAUZE/BANDAGES/DRESSINGS) ×1
BANDAGE ELASTIC 4 VELCRO ST LF (GAUZE/BANDAGES/DRESSINGS) IMPLANT
BANDAGE ELASTIC 6 VELCRO ST LF (GAUZE/BANDAGES/DRESSINGS) ×1 IMPLANT
BANDAGE ESMARK 6X9 LF (GAUZE/BANDAGES/DRESSINGS) ×1 IMPLANT
BANDAGE GAUZE ELAST BULKY 4 IN (GAUZE/BANDAGES/DRESSINGS) ×2 IMPLANT
BENZOIN TINCTURE PRP APPL 2/3 (GAUZE/BANDAGES/DRESSINGS) ×1 IMPLANT
BIT DRILL 2.5X125 (BIT) ×1 IMPLANT
BIT DRILL 3.1X204 (BIT) ×1 IMPLANT
BLADE SURG 10 STRL SS (BLADE) ×2 IMPLANT
BLADE SURG 15 STRL LF DISP TIS (BLADE) ×1 IMPLANT
BLADE SURG 15 STRL SS (BLADE) ×2
BNDG CMPR 9X6 STRL LF SNTH (GAUZE/BANDAGES/DRESSINGS) ×1
BNDG COHESIVE 4X5 TAN STRL (GAUZE/BANDAGES/DRESSINGS) ×2 IMPLANT
BNDG ESMARK 6X9 LF (GAUZE/BANDAGES/DRESSINGS) ×2
CLOTH BEACON ORANGE TIMEOUT ST (SAFETY) ×2 IMPLANT
CONNECTOR 5 IN 1 STRAIGHT STRL (MISCELLANEOUS) ×2 IMPLANT
COVER MAYO STAND STRL (DRAPES) ×2 IMPLANT
COVER SURGICAL LIGHT HANDLE (MISCELLANEOUS) ×2 IMPLANT
CUFF TOURNIQUET SINGLE 34IN LL (TOURNIQUET CUFF) IMPLANT
DRAPE C-ARM 42X72 X-RAY (DRAPES) ×3 IMPLANT
DRAPE C-ARMOR (DRAPES) IMPLANT
DRAPE INCISE IOBAN 66X45 STRL (DRAPES) ×4 IMPLANT
DRAPE U-SHAPE 47X51 STRL (DRAPES) ×2 IMPLANT
DRSG ADAPTIC 3X8 NADH LF (GAUZE/BANDAGES/DRESSINGS) ×1 IMPLANT
DRSG PAD ABDOMINAL 8X10 ST (GAUZE/BANDAGES/DRESSINGS) ×6 IMPLANT
ELECT REM PT RETURN 9FT ADLT (ELECTROSURGICAL) ×2
ELECTRODE REM PT RTRN 9FT ADLT (ELECTROSURGICAL) ×1 IMPLANT
GLOVE BIO SURGEON STRL SZ7 (GLOVE) ×1 IMPLANT
GLOVE BIO SURGEON STRL SZ7.5 (GLOVE) ×3 IMPLANT
GLOVE BIO SURGEON STRL SZ8.5 (GLOVE) ×1 IMPLANT
GLOVE BIOGEL PI IND STRL 8 (GLOVE) ×1 IMPLANT
GLOVE BIOGEL PI INDICATOR 8 (GLOVE) ×2
GLOVE SS BIOGEL STRL SZ 8.5 (GLOVE) IMPLANT
GLOVE SUPERSENSE BIOGEL SZ 8.5 (GLOVE) ×1
GLOVE SURG SS PI 7.0 STRL IVOR (GLOVE) ×2 IMPLANT
GOWN STRL NON-REIN LRG LVL3 (GOWN DISPOSABLE) ×4 IMPLANT
GOWN STRL REIN XL XLG (GOWN DISPOSABLE) ×3 IMPLANT
IMMOBILIZER KNEE 22 UNIV (SOFTGOODS) ×1 IMPLANT
KIT BASIN OR (CUSTOM PROCEDURE TRAY) ×2 IMPLANT
KIT ROOM TURNOVER OR (KITS) ×2 IMPLANT
MANIFOLD NEPTUNE II (INSTRUMENTS) ×2 IMPLANT
NS IRRIG 1000ML POUR BTL (IV SOLUTION) ×2 IMPLANT
PACK ARTHROSCOPY DSU (CUSTOM PROCEDURE TRAY) ×2 IMPLANT
PAD ARMBOARD 7.5X6 YLW CONV (MISCELLANEOUS) ×4 IMPLANT
PIN FIX STIENMAN 2X230 (PIN) IMPLANT
PIN STIENMAN 2.0 (PIN) ×2 IMPLANT
PLATE 2H LT LAT TIBIA (Plate) ×1 IMPLANT
SCREW CORTEX ST MATTA 3.5X28MM (Screw) ×1 IMPLANT
SCREW CORTEX ST MATTA 3.5X32MM (Screw) ×1 IMPLANT
SCREW CORTEX ST MATTA 3.5X36MM (Screw) ×1 IMPLANT
SCREW CORTICAL 3.5X42MM (Screw) ×1 IMPLANT
SCREW LOCKING 4.0X65MM (Screw) ×2 IMPLANT
SCREW LOCKING 60MM (Screw) ×1 IMPLANT
SPONGE GAUZE 4X4 12PLY (GAUZE/BANDAGES/DRESSINGS) ×2 IMPLANT
SPONGE LAP 18X18 X RAY DECT (DISPOSABLE) ×2 IMPLANT
STAPLER VISISTAT 35W (STAPLE) IMPLANT
STOCKINETTE IMPERVIOUS LG (DRAPES) ×2 IMPLANT
STRIP CLOSURE SKIN 1/2X4 (GAUZE/BANDAGES/DRESSINGS) ×1 IMPLANT
SUCTION FRAZIER TIP 10 FR DISP (SUCTIONS) ×2 IMPLANT
SUT ETHILON 3 0 PS 1 (SUTURE) IMPLANT
SUT MNCRL AB 4-0 PS2 18 (SUTURE) ×1 IMPLANT
SUT MON AB 2-0 CT1 36 (SUTURE) ×1 IMPLANT
SUT VIC AB 0 CT1 27 (SUTURE) ×6
SUT VIC AB 0 CT1 27XBRD ANBCTR (SUTURE) ×2 IMPLANT
TOWEL OR 17X24 6PK STRL BLUE (TOWEL DISPOSABLE) ×2 IMPLANT
TOWEL OR 17X26 10 PK STRL BLUE (TOWEL DISPOSABLE) ×4 IMPLANT
TRAY FOLEY CATH 16FRSI W/METER (SET/KITS/TRAYS/PACK) IMPLANT
TUBE CONNECTING 12X1/4 (SUCTIONS) ×2 IMPLANT
WATER STERILE IRR 1000ML POUR (IV SOLUTION) ×4 IMPLANT
YANKAUER SUCT BULB TIP NO VENT (SUCTIONS) ×3 IMPLANT

## 2013-08-22 NOTE — Consult Note (Signed)
PULMONARY  / CRITICAL CARE MEDICINE  Name: Elizabeth [name] MRN: [id number] DOB: 21-Sep-1956    ADMISSION DATE:  08/22/2013 CONSULTATION DATE:  08/22/2013  REFERRING MD :  Manson Passey PRIMARY SERVICE:  PCCM  CHIEF COMPLAINT:  Acute respiratory failure  BRIEF PATIENT DESCRIPTION: 57 yo with past medical history of lupus, CAD and ischemic cardiomyopathy (EF 40%) as well as alcohol and cocaine abuse admitted for elective ORIF of tibial plateau brought to [facility] ICU after developing respiratory distress and increased oxygen requirements. PCCM was consulted.  SIGNIFICANT EVENTS / STUDIES:  10/7  OR >>>ORIF of left tibial plateau fracture 10/7  Respiratory distress, increased oxygen requirements, transferred to ICU  LINES / TUBES:  CULTURES:  ANTIBIOTICS: Cefazolin 10/7 >>>  HISTORY OF PRESENT ILLNESS:  57 yo with past medical history of lupus, CAD and ischemic cardiomyopathy (EF 40%) as well as alcohol and cocaine abuse admitted for elective ORIF of tibial plateau brought to [facility] ICU after developing respiratory distress and increased oxygen requirements. PCCM was consulted.  At the time of my examination reports mild dyspnea, no chest pain, no palpitations.  She does report left leg pain and numbness as well as sore throat.  PAST MEDICAL HISTORY :  Past Medical History  Diagnosis Date  . Hypertension   . HLD (hyperlipidemia)     Chol = 235, LDL = 156 (08/2010)  . Substance abuse     cocaine   . Depression   . Lupus 2009    ANA + 10/2008, repeat ANA + (05/2009), on that visit 05/2009 following labs obtained RF <20, CRP <0.4, ANA titers 1:80,   . CAD (coronary artery disease)     NSTEMI 08/2011:  LHC 08/21/11: mLAD 60-70%, pCFX occluded, dRCA chronic occlusion with L-R collats, EF 40-45%, inf AK.  PCI:  BMS to CFX.  Marland Kitchen Pulmonary nodules 05/2008    noted on CXR and CT 05/2008, repeat CT 10/2008 - Stable small bilateral pulmonary nodules measuring up to 6 m m  . Insomnia   . GERD  (gastroesophageal reflux disease)   . Fibroids   . History of microcytic hypochromic anemia   . Fibromyalgia   . Myocardial infarction    Past Surgical History  Procedure Laterality Date  . Abdominal hysterectomy  05/2003  . Colonoscopy N/A 07/14/2013    Procedure: COLONOSCOPY;  Surgeon: Beryle Beams, MD;  Location: [facility] ENDOSCOPY;  Service: Endoscopy;  Laterality: N/A;  . Stent on chest    . Cardiac catheterization     Prior to Admission medications   Medication Sig Start Date End Date Taking? Authorizing Provider  carvedilol (COREG) 6.25 MG tablet Take 1 tablet (6.25 mg total) by mouth 2 (two) times daily. 06/13/13 06/13/14 Yes [name], MD  furosemide (LASIX) 20 MG tablet Take 1 tablet (20 mg total) by mouth daily. 05/15/13  Yes Na Li, MD  omeprazole (PRILOSEC OTC) 20 MG tablet Take 1 tablet (20 mg total) by mouth daily. 06/13/13 06/13/14 Yes [name], MD  rosuvastatin (CRESTOR) 40 MG tablet Take 1 tablet (40 mg total) by mouth daily. 05/15/13 05/15/14 Yes Na Li, MD  zolpidem (AMBIEN) 10 MG tablet Take 1 tablet (10 mg total) by mouth at bedtime as needed for sleep. 06/13/13  Yes [name], MD  aspirin EC 325 MG tablet Take 1 tablet (325 mg total) by mouth daily. 08/22/13   Renette Butters, MD  oxyCODONE (ROXICODONE) 5 MG immediate release tablet Take 2 tablets (10 mg total) by mouth every 4 (  four) hours as needed for pain. 08/22/13   Renette Butters, MD   No Known Allergies  FAMILY HISTORY:  Family History  Problem Relation Age of Onset  . Heart disease Mother   . Cervical cancer Sister   . Prostate cancer Brother   . Colon cancer Neg Hx    SOCIAL HISTORY:  reports that she has been smoking Cigarettes.  She has been smoking about 0.30 packs per day. She does not have any smokeless tobacco history on file. She reports that she drinks about 3.5 ounces of alcohol per week. She reports that she does not use illicit drugs.  REVIEW OF SYSTEMS:  Negative except as in  HPI.  INTERVAL HISTORY:  VITAL SIGNS: Temp:  [97.2 F (36.2 C)-98.5 F (36.9 C)] 98.5 F (36.9 C) (10/07 1800) Pulse Rate:  [62-81] 74 (10/07 1650) Resp:  [14-22] 22 (10/07 1800) BP: (139-185)/(74-111) 174/80 mmHg (10/07 1800) SpO2:  [90 %-100 %] 95 % (10/07 1800)  HEMODYNAMICS:   VENTILATOR SETTINGS:   INTAKE / OUTPUT: Intake/Output     10/07 0701 - 10/08 0700   I.V. 1400   Total Intake 1400   Urine 225   Blood 50   Total Output 275   Net +1125        PHYSICAL EXAMINATION: General:  Appears to be in no distress Neuro:  Awake, alert, non focal, anxcious HEENT:  PERRL Cardiovascular:  RRR, no m/r/g Lungs:  Bilateral diminished air entry, no w/r/r Abdomen:  Soft, nontender, bowel sounds diminished Musculoskeletal:  No edema, immobilizer LLE Skin:  Intact  LABS:  Recent Labs Lab 08/16/13 0415 08/22/13 1053 08/22/13 1054 08/22/13 1700 08/22/13 1825  HGB  --  12.2 11.2*  --   --   WBC  --   --  7.6  --   --   PLT  --   --  320  --   --   NA 137 142 140  --   --   K 3.8 3.9 3.8  --   --   CL 100  --  102  --   --   CO2 26  --  28  --   --   GLUCOSE 105* 106* 102*  --   --   BUN 11  --  9  --   --   CREATININE 0.79  --  0.67  --   --   CALCIUM 9.2  --  9.2  --   --   AST  --   --  20  --   --   ALT  --   --  30  --   --   ALKPHOS  --   --  274*  --   --   BILITOT  --   --  0.4  --   --   PROT  --   --  7.2  --   --   ALBUMIN  --   --  2.9*  --   --   TROPONINI  --   --   --   --  <0.30  PROBNP  --   --   --   --  554.5*  PHART  --   --   --  7.413  --   PCO2ART  --   --   --  43.3  --   PO2ART  --   --   --  67.2*  --    No results found for this  basename: GLUCAP,  in the last 168 hours  CXR:  10/7 >>> Mild pulmonary vascular congestion, no overt infiltrates  ASSESSMENT / PLAN:  PULMONARY A:  Acute hypoxemic respiratory failure, no improved; likely secondary to flash pulmonary edema in setting of hypertensive emergency.  Doubt PE as rapid  clinical improvement. P:   Gaol SpO2>92 Supplemental oxygen PRN Albuterol PRN only  CARDIOVASCULAR A: CAD in setting of Lupus. Ischemic cardiomyopathy (EF 40-45%). Hypertensive emergency, now improved.  Acute on chronic systolic CHF. Demand ischemia (ST changes in lateral leads, Troponin wnl). P:  Goal SBP<160 and DBP<90 Trend lactate Continue Lipitor, Coreg, ASA Would defer Heparin gtt for now Hydralazine PRN Cardiology consulted  RENAL A:  Mild hypokalemia. P:   Trend BMP KCl 40 x 1 Given Lasix 40 x 1 at onset of symptoms Continue Lasix 20 daily IVF off  GASTROINTESTINAL A:  No active issues. P:   Protonix as on PPI preadmission Advance diet as tolerated  HEMATOLOGIC A:  Acute blood loss anemia. P:  Trend CBC SCDs for DVT Px  INFECTIOUS A:  No active issues. P:   Perioperative abx per Ortho  ENDOCRINE  A:  No active issues. P:   No intervention required  NEUROLOGIC A:  Post op pain.  Cocaine and alcohol abuse. P:   Dilaudid / Oxycodone / Tylenol CIWA / Ativan PRN Folate / Thiamine  I have personally obtained history, examined patient, evaluated and interpreted laboratory and imaging results, reviewed medical records, formulated assessment / plan and placed orders.  CRITICAL CARE:  The patient is critically ill with multiple organ systems failure and requires high complexity decision making for assessment and support, frequent evaluation and titration of therapies, application of advanced monitoring technologies and extensive interpretation of multiple databases. Critical Care Time devoted to patient care services described in this note is 35 minutes.   Doree Fudge, MD Pulmonary and [place] Pager: [phone]  08/22/2013, 7:43 PM

## 2013-08-22 NOTE — Progress Notes (Signed)
Orthopedic Tech Progress Note Patient Details:  GEORGANA ROMAIN Jan 17, 1956 [id number] Applied overhead frame to bed.     Braulio Bosch 08/22/2013, 4:44 PM

## 2013-08-22 NOTE — Consult Note (Signed)
Cardiology Consult Note   Patient ID: Elizabeth [name] MRN: [id number], DOB/AGE: Dec 23, 1955   Admit date: 08/22/2013 Date of Consult: 08/22/2013  Primary Physician: [name], MD Primary Cardiologist: T. Wall, MD  Reason for consult: pulmonary edema, new EKG changes  HPI: Elizabeth [name] is a 57 y.o. female w/ PMHx s/f CAD (s/p BMS-LCx, 60-70% mid LAD; EF 40-45% in 2012), ischemic cardiomyopathy, h/o cocaine use (UDS negative 08/14/13), SLE, stable pulmonary nodules, GERD and COPD who was transferred to CCU after an elective tibia fracture repair for acute pulmonary edema and new lateral EKG changes.   She was last seen in the office in 10/2011 by Richardson Dopp, PA-C. She had underwent a cardiac cath in 08/2011 in the setting of NSTEMI and cocaine use. In addition to findings above, she also had a distal RCA CTO with R-L collateralization. She committed to abstaining from cocaine abuse, and was started on OMT including BB, ACEi. She did note dyspnea and weight gain. Pro BNP was only mildly elevated. This was attributed to COPD and Lasix was not added. Last echo in 2005 indicated preserved EF and otherwise unremarkable. She was evaluated in the ED in 04/2013 for chest pain. Cardiac biomarkers reutrned WNL. Admission for further eval was recommended but the patient refused.    She fell off of her porch on 9/29 accidentally. Radiographs indicated a R tibial plateau fracture and she was scheduled for ORIF which was performed today w/o immediate complication. Post-operatively, she became hypertensive (185/111), hypoxic (SpO2 80-86%). She was given Lasix 61m IV x 1. CXR indicated pulmonary edema. EKG revealed unchanged lateral ST depressions in V4-V6 (apparent from 04/2013 tracing). Mild STE in V1, V2 (unchanged). Initial TnI WNL. Pro BNP 554. I/O + 1125 cc.   Patient currently agitated.   Problem List: Past Medical History  Diagnosis Date  . Hypertension   . HLD (hyperlipidemia)     Chol = 235, LDL = 156 (08/2010)  . Substance abuse     cocaine   . Lupus 2009    ANA + 10/2008, repeat ANA + (05/2009), on that visit 05/2009 following labs obtained RF <20, CRP <0.4, ANA titers 1:80,   . Pulmonary nodules 05/2008    noted on CXR and CT 05/2008, repeat CT 10/2008 - Stable small bilateral pulmonary nodules measuring up to 6 m m  . Insomnia   . GERD (gastroesophageal reflux disease)   . Fibroids   . History of microcytic hypochromic anemia   . Fibromyalgia   . Myocardial infarction   . CAD (coronary artery disease)     NSTEMI 08/2011:  LHC 08/21/11: mLAD 60-70%, pCFX occluded, dRCA chronic occlusion with L-R collats, EF 40-45%, inf AK.  PCI:  BMS to CFX.  .Marland KitchenDepression   . Shortness of breath 08/22/2013    Past Surgical History  Procedure Laterality Date  . Abdominal hysterectomy  05/2003  . Colonoscopy N/A 07/14/2013    Procedure: COLONOSCOPY;  Surgeon: PBeryle Beams MD;  Location: [facility];  Service: Endoscopy;  Laterality: N/A;  . Stent on chest    . Cardiac catheterization       Allergies: No Known Allergies  Home Medications: Prior to Admission medications   Medication Sig Start Date End Date Taking? Authorizing Provider  carvedilol (COREG) 6.25 MG tablet Take 1 tablet (6.25 mg total) by mouth 2 (two) times daily. 06/13/13 06/13/14 Yes SMadilyn Fireman MD  furosemide (LASIX) 20 MG tablet Take 1 tablet (20 mg total) by mouth daily.  05/15/13  Yes Na Li, MD  omeprazole (PRILOSEC OTC) 20 MG tablet Take 1 tablet (20 mg total) by mouth daily. 06/13/13 06/13/14 Yes [name], MD  rosuvastatin (CRESTOR) 40 MG tablet Take 1 tablet (40 mg total) by mouth daily. 05/15/13 05/15/14 Yes Na Li, MD  zolpidem (AMBIEN) 10 MG tablet Take 1 tablet (10 mg total) by mouth at bedtime as needed for sleep. 06/13/13  Yes [name], MD  aspirin EC 325 MG tablet Take 1 tablet (325 mg total) by mouth daily. 08/22/13   Renette Butters, MD  oxyCODONE (ROXICODONE) 5 MG immediate  release tablet Take 2 tablets (10 mg total) by mouth every 4 (four) hours as needed for pain. 08/22/13   Renette Butters, MD    Inpatient Medications:  . albuterol  2.5 mg Nebulization Q6H  . aspirin  325 mg Oral Daily  . atorvastatin  80 mg Oral q1800  . carvedilol  6.25 mg Oral BID WC  .  ceFAZolin (ANCEF) IV  2 g Intravenous Q6H  . [START ON 08/23/2013] furosemide  20 mg Oral Daily  . pantoprazole  40 mg Oral Q1200   Prescriptions prior to admission  Medication Sig Dispense Refill  . carvedilol (COREG) 6.25 MG tablet Take 1 tablet (6.25 mg total) by mouth 2 (two) times daily.  60 tablet  5  . furosemide (LASIX) 20 MG tablet Take 1 tablet (20 mg total) by mouth daily.  30 tablet  3  . omeprazole (PRILOSEC OTC) 20 MG tablet Take 1 tablet (20 mg total) by mouth daily.  30 tablet  1  . rosuvastatin (CRESTOR) 40 MG tablet Take 1 tablet (40 mg total) by mouth daily.  30 tablet  11  . zolpidem (AMBIEN) 10 MG tablet Take 1 tablet (10 mg total) by mouth at bedtime as needed for sleep.  30 tablet  2  . [DISCONTINUED] OxyCODONE (OXYCONTIN) 20 mg T12A 12 hr tablet Take 1 tablet (20 mg total) by mouth every 12 (twelve) hours.  30 tablet  0  . [DISCONTINUED] oxyCODONE 10 MG TABS Take 1 tablet (10 mg total) by mouth every 4 (four) hours as needed.  50 tablet  0  . [DISCONTINUED] enoxaparin (LOVENOX) 40 MG/0.4ML injection Inject 0.4 mLs (40 mg total) into the skin daily.  4 Syringe  0    Family History  Problem Relation Age of Onset  . Heart disease Mother   . Cervical cancer Sister   . Prostate cancer Brother   . Colon cancer Neg Hx      History   Social History  . Marital Status: Divorced    Spouse Name: N/A    Number of Children: N/A  . Years of Education: N/A   Occupational History  . Not on file.   Social History Main Topics  . Smoking status: Current Some Day Smoker -- 0.30 packs/day    Types: Cigarettes  . Smokeless tobacco: Not on file     Comment: 3 cigs per day- given QUIT  line info  . Alcohol Use: 3.5 oz/week    7 drink(s) per week     Comment: beer 1-2 per day  . Drug Use: No     Comment: last used cocaine less than a month ago  . Sexual Activity: Not Currently   Other Topics Concern  . Not on file   Social History Narrative  . No narrative on file     Review of Systems:  Unable to obtain at this  time due to patient agitation.   All other systems reviewed and are otherwise negative except as noted above.  Physical Exam: Blood pressure 173/112, pulse 82, temperature 98.3 F (36.8 C), temperature source Oral, resp. rate 22, height _0  (1.575 m), weight 88.6 kg (195 lb 5.2 oz), last menstrual period 12/30/1994, SpO2 99.00%.    Unable to perform at this time due to patient agitation.   Labs: Recent Labs     08/22/13  1053  08/22/13  1054  WBC   --   7.6  HGB  12.2  11.2*  HCT  36.0  34.5*  MCV   --   88.0  PLT   --   320   Recent Labs Lab 08/16/13 0415 08/22/13 1053 08/22/13 1054  NA 137 142 140  K 3.8 3.9 3.8  CL 100  --  102  CO2 26  --  28  BUN 11  --  9  CREATININE 0.79  --  0.67  CALCIUM 9.2  --  9.2  PROT  --   --  7.2  BILITOT  --   --  0.4  ALKPHOS  --   --  274*  ALT  --   --  30  AST  --   --  20  GLUCOSE 105* 106* 102*    Recent Labs     08/22/13  1825  TROPONINI  <0.30   Radiology/Studies: Dg Chest 2 View  08/14/2013   CLINICAL DATA:  Preop.  EXAM: CHEST  2 VIEW  COMPARISON:  05/12/2013  FINDINGS: Heart it is borderline in size. Lungs are clear. No effusions or acute bony abnormality.  IMPRESSION: No acute cardiopulmonary disease.   Electronically Signed   By: Rolm Baptise M.D.   On: 08/14/2013 22:14   Dg Knee 1-2 Views Left  08/22/2013   CLINICAL DATA:  Tibial plateau fracture  EXAM: LEFT KNEE - 1-2 VIEW; DG C-ARM 1-60 MIN  COMPARISON:  Multiple priors.  FINDINGS: C-arm films document open reduction internal fixation of a tibial plateau fracture. Lateral locking plate and screw device was used. Improved  position and alignment.  IMPRESSION: As above.   Electronically Signed   By: Rolla Flatten M.D.   On: 08/22/2013 15:23   Dg Knee 2 Views Left  08/14/2013   CLINICAL DATA:  Fall, knee pain.  EXAM: LEFT KNEE - 1-2 VIEW  COMPARISON:  None.  FINDINGS: FRACTURE NOTED THROUGH THE PROXIMAL TIBIA EXTENDING ACROSS THE ENTIRE PROXIMAL TIBIA METAPHYSIS. THERE IS PRESUMED EXTENSION TO THE KNEE JOINT AS THERE IS A LARGE JOINT EFFUSION, BUT DIFFICULT TO SEE ON PLAIN FILMS WITH THE LIMITED POSITIONING DUE TO THE PATIENT'S CONDITION. NO VISIBLE FEMUR OR FIBULA ABNORMALITY.  IMPRESSION: PROXIMAL TIBIAL FRACTURE WITH PRESUMED EXTENSION INTO THE JOINT SPACE. LARGE JOINT EFFUSION.   Electronically Signed   By: Rolm Baptise M.D.   On: 08/14/2013 21:20   Ct Knee Left Wo Contrast  08/15/2013   CLINICAL DATA:  [place]. Injured knee. Evaluate tibial plateau fracture.  EXAM: CT OF THE LEFT KNEE WITHOUT CONTRAST  TECHNIQUE: Multidetector CT imaging was performed according to the standard protocol. Multiplanar CT image reconstructions were also generated.  COMPARISON:  Radiographs 08/14/2013.  FINDINGS: There is a comminuted and depressed tibial plateau fracture. The main component is a transverse fracture involving both the medial and lateral plateau. The lateral portion is not significantly displaced or depressed. The medial component does demonstrate depression and impaction along the medial cortex. There is  also a transverse fracture through the fibular head. The femur is intact. The patella is normal.  There is an associated lipohemarthrosis. The cruciate and collateral ligaments appear to be intact. The patellar and quadriceps tendons are intact.  IMPRESSION: Comminuted and depressed tibial plateau fracture more significantly involving the medial plateau and head.  Nondisplaced transverse fracture through the fibular head.   Electronically Signed   By: Kalman Jewels M.D.   On: 08/15/2013 00:10   Ct Abdomen Pelvis W  Contrast  08/08/2013   CLINICAL DATA:  Right upper quadrants abdominal pain. Epigastric pain.  EXAM: CT ABDOMEN AND PELVIS WITH CONTRAST  TECHNIQUE: Multidetector CT imaging of the abdomen and pelvis was performed using the standard protocol following bolus administration of intravenous contrast.  CONTRAST:  135m OMNIPAQUE IOHEXOL 300 MG/ML  SOLN  COMPARISON:  None.  FINDINGS: A 0.5 x 0.3 cm nodule in the right lower lobe is blurred by motion artifact on image 3 of series 3. The liver, spleen, pancreas, and adrenal glands appear unremarkable. The common bile duct measures up to 8 mm, mildly abnormally dilated. Gallbladder unremarkable.  A gastrohepatic ligament lymph node measures 1.3 cm in short axis on image 13 of series 2  An 8 mm right and a 7 mm left kidney lower pole fluid density renal lesion probably represent cyst although are technically nonspecific due to small size. Kidneys otherwise unremarkable.  Aortoiliac atherosclerotic vascular disease. Appendix normal. Orally administered contrast extends through to the colon.  Uterus absent. There is a trace amount of free pelvic fluid eccentric to the right. Urinary bladder unremarkable. The cecum sits down in the pelvis.  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 This is probably chronic. Equivocal tiny focus of similar AVN posteriorly in the right femoral head  Mild lower lumbar spondylosis and degenerative disc disease.  IMPRESSION: 1. Mild abnormal dilation of the common bile duct, although this seems to have potentially been present on prior CT exams back through 2009. 2. Small right lower lobe pulmonary nodule, no change from 06/14/2008, considered benign. 3. The mildly enlarged gastrohepatic lymph node is observed. This previously measured up to 10 mm in short axis and currently measures 13 mm. This could represent a reactive lymph node although is technically nonspecific. 4. Trace free pelvic fluid  eccentric to the right. 5. 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 6. Lower lumbar spondylosis and degenerative disc disease. 7. Atherosclerosis. 8. Small hypodense lesions of the lower poles of each kidney likely represent cysts but are technically nonspecific due to small size.   Electronically Signed   By: WSherryl Barters  On: 08/08/2013 13:49   Dg Knee Left Port  08/22/2013   CLINICAL DATA:  Postop tibial plateau fracture ORIF  EXAM: PORTABLE LEFT KNEE - 1-2 VIEW  COMPARISON:  08/22/2013 at 10/2019  FINDINGS: There is a comminuted tibial plateau fracture. There is a lateral compression plate, and fracture fragments demonstrate near anatomic position with no significant depression. There is also a nondisplaced oblique fracture involving the proximal fibula.  IMPRESSION: ORIF proximal tibial fracture.   Electronically Signed   By: RSkipper ClicheM.D.   On: 08/22/2013 14:53   Dg C-arm 1-60 Min  08/22/2013   CLINICAL DATA:  Tibial plateau fracture  EXAM: LEFT KNEE - 1-2 VIEW; DG C-ARM 1-60 MIN  COMPARISON:  Multiple priors.  FINDINGS: C-arm films document open reduction internal  fixation of a tibial plateau fracture. Lateral locking plate and screw device was used. Improved position and alignment.  IMPRESSION: As above.   Electronically Signed   By: Rolla Flatten M.D.   On: 08/22/2013 15:23   EKG: NSR, 78 bpm, downsloping TWIs V4-V6 (unchanged from prior tracings), < 1 mm STE V1, V2 (unchanged from prior tracings), new TWIs I, aVL (TW flattening on prior tracings)  ASSESSMENT AND PLAN:   1. Acute hypoxic respiratory distress 2. Acute pulmonary edema 3. Hypertensive emergency 4. CAD s/p PCI 5. Ischemic cardiomyopathy, EF 40-45% 6. COPD 7. H/o cocaine abuse 8. Pulmonary nodules 9. GERD 10. L tibial plateau fracture s/p ORIF  The patient's HPI is currently limited by her agitation. Objectively, EKG indicates largely unchanged TW changes  laterally-- TWIs in I, aVL are more prominent than prior tracings. Initial TnI WNL. Pro BNP mildly elevated. CXR indicated pulmonary edema by CCM interpretation. Lasix 64m IV given. Patient did state she has been urinating frequently since this was given. Question if I/Os are accurate. She is talking in full sentences and O2 sat's are high 90s/100s. Check 2D echo. Cycle CEs. Hold IVFs. Treat hypertension with hydralazine PRN for now. Up-titrate ACEi/BB. Consider adding CCB if BP remains elevated. Further comprehensive thoughts below.    Signed, R. TValeria Batman PA-C 08/22/2013, 7:48 PM

## 2013-08-22 NOTE — Anesthesia Postprocedure Evaluation (Deleted)
Anesthesia Post Note  Patient: Elizabeth [name]  Procedure(s) Performed: Procedure(s) (LRB): OPEN REDUCTION INTERNAL FIXATION (ORIF) TIBIAL PLATEAU (Left)  Anesthesia type: general  Patient location: PACU  Post pain: Pain level controlled  Post assessment: Patient's Cardiovascular Status Stable  Last Vitals:  Filed Vitals:   08/22/13 1315  BP: 165/82  Pulse: 77  Temp:   Resp: 16    Post vital signs: Reviewed and stable  Level of consciousness: sedated  Complications: No apparent anesthesia complications

## 2013-08-22 NOTE — Anesthesia Postprocedure Evaluation (Signed)
  Anesthesia Post-op Note  Patient: [place]  Procedure(s) Performed: Procedure(s): OPEN REDUCTION INTERNAL FIXATION (ORIF) TIBIAL PLATEAU (Left)  Patient Location: PACU  Anesthesia Type:General  Level of Consciousness: awake, alert  and oriented  Airway and Oxygen Therapy: Patient Spontanous Breathing and Patient connected to nasal cannula oxygen  Post-op Pain: mild  Post-op Assessment: Post-op Vital signs reviewed, Patient's Cardiovascular Status Stable, Respiratory Function Stable, Patent Airway and Pain level controlled  Post-op Vital Signs: stable  Complications: No apparent anesthesia complications

## 2013-08-22 NOTE — Progress Notes (Signed)
UR COMPLETED.  

## 2013-08-22 NOTE — Op Note (Addendum)
08/22/2013  12:32 PM  PATIENT:  Elizabeth [name]    PRE-OPERATIVE DIAGNOSIS:  Left plateau fracture  POST-OPERATIVE DIAGNOSIS:  Same  PROCEDURE:  OPEN REDUCTION INTERNAL FIXATION (ORIF) TIBIAL PLATEAU  SURGEON:  Edmonia Lynch, D, MD  ASSISTANT: Joya Gaskins, OPA  ANESTHESIA:   Gen  PREOPERATIVE INDICATIONS:  Elizabeth [name] is a  57 y.o. female with a diagnosis of Left plateau fracture who failed conservative measures and elected for surgical management.    The risks benefits and alternatives were discussed with the patient preoperatively including but not limited to the risks of infection, bleeding, nerve injury, cardiopulmonary complications, the need for revision surgery, among others, and the patient was willing to proceed.  OPERATIVE IMPLANTS: Ant/lat Locking plate, [employer]  OPERATIVE FINDINGS: Stable fixation  BLOOD LOSS: min  COMPLICATIONS: none  TOURNIQUET TIME: 60  OPERATIVE PROCEDURE:  Patient was identified in the preoperative holding area and site was marked by me He was transported to the operating theater and placed on the table in supine position taking care to pad all bony prominences. After a preincinduction time out anesthesia was induced. The Left lower extremity was prepped and draped in normal sterile fashion and a pre-incision timeout was performed. [place] received Ancef for preoperative antibiotics.    I made a 8 cm anterolateral incision and incised sharply down the fascia. I palpated the joint line I then incised the fascia the anterolateral compartment just off of the crest of the tibia and elevated it posteriorly off of the tibia. I created an L-shaped fascial incision staying distal to the joint line. There is no interval displacement of the joint inside elected to not enter the joint for visualization. I then selected a short anterior lateral locking plate. I placed in eye acceptable position on the joint line. I pinned in place  and took multiple views confirming appropriate location proximally. I then placed 3 locking screws fixing it to the joint line. As happy with her dislocation and fixation of the joint. I then placed a valgus stress across the fracture reducing the fracture and placed 2 nonlocking screws distally. Then placed a third nonlocking screw through the oblique hole because the fracture did not extend down as far. I had a good bite on all 3 distal nonlocking screws and good raft screws proximally. As happy with the reduction and fixation. I then thoroughly irrigated the wound we closed the fascia over top of the plate. I then closed the skin in layers with a 2-0 Monocryl and 4-0 Monocryl placed a sterile dressing and placed her in the immobilizer.  POST OPERATIVE PLAN: She'll be nonweightbearing of the left lower extremity. She'll take aspirin once a day for DVT prophylaxis. She had previously been perscribed lovenox but reports that she was not compliant with this.

## 2013-08-22 NOTE — Progress Notes (Signed)
ANTICOAGULATION CONSULT NOTE - Initial Consult  Pharmacy Consult for Heparin Indication: chest pain/ACS  No Known Allergies Patient Measurements: Weight 80 kg on 08/15/13 Height 162 cm on 08/15/13 IBW 54.19 kg Heparin Dosing Weight: 71.4 kg Vital Signs: Temp: 98.1 F (36.7 C) (10/07 1550) Temp src: Oral (10/07 1550) BP: 161/94 mmHg (10/07 1550) Pulse Rate: 75 (10/07 1550) Labs:  Recent Labs  08/22/13 1053 08/22/13 1054  HGB 12.2 11.2*  HCT 36.0 34.5*  PLT  --  320  CREATININE  --  0.67  CrCl >100 mL/min.  Medical History: Past Medical History  Diagnosis Date  . Hypertension   . HLD (hyperlipidemia)     Chol = 235, LDL = 156 (08/2010)  . Substance abuse     cocaine   . Depression   . Lupus 2009    ANA + 10/2008, repeat ANA + (05/2009), on that visit 05/2009 following labs obtained RF <20, CRP <0.4, ANA titers 1:80,   . CAD (coronary artery disease)     NSTEMI 08/2011:  LHC 08/21/11: mLAD 60-70%, pCFX occluded, dRCA chronic occlusion with L-R collats, EF 40-45%, inf AK.  PCI:  BMS to CFX.  Marland Kitchen Pulmonary nodules 05/2008    noted on CXR and CT 05/2008, repeat CT 10/2008 - Stable small bilateral pulmonary nodules measuring up to 6 m m  . Insomnia   . GERD (gastroesophageal reflux disease)   . Fibroids   . History of microcytic hypochromic anemia   . Fibromyalgia   . Myocardial infarction    Assessment: 70 YOF s/p ORIF of tibia on 10/7 now with acute pulmonary edema on CXR, abnormal EKG, and lateral wall ischemia to start IV heparin for ACS (ok per orthopedics, see Dr. Kennith Gain note).   AET 1301PM Baseline INR on 9/30 wnl.  H/H/Platleets stable.  No bleeding reported.  CrCl >100 ml/min.   Goal of Therapy:  Heparin level 0.3-0.7 units/ml Monitor platelets by anticoagulation protocol: Yes   Plan:  1. Start heparin at 900 units/hr. No bolus due to recent surgery. 2. Heparin level in 6 hours. 3. Daily heparin level and CBC.   Sloan Leiter, PharmD,  BCPS Clinical Pharmacist [phone] 08/22/2013,6:23 PM

## 2013-08-22 NOTE — Progress Notes (Signed)
eLink Physician-Brief Progress Note Patient Name: Elizabeth [name] DOB: 1955/12/05 MRN: [id number]  Date of Service  08/22/2013   HPI/Events of Note   Pt s/p elective tib plateau frx repair today and is 2 hours post d/c from pacu. Pt more hypoxic.  PCCM consulted. Pt hypertensive   eICU Interventions  Give lasix x one dose Check cxr, labs, ecg Full pccm consult to follow    Intervention Category Major Interventions: Respiratory failure - evaluation and management;Hypoxemia - evaluation and management  Mariel Sleet 08/22/2013, 5:39 PM

## 2013-08-22 NOTE — Progress Notes (Signed)
[place] Progress Note Patient Name: Elizabeth [name] DOB: 1956/07/03 MRN: [id number]  Date of Service  08/22/2013   HPI/Events of Note   Pt with acute pulm edema on cxr. Abn ecg.  Lateral wall ischemia Hx of CAD and ischemic CM  eICU Interventions  Plan tfr to icu, cards consult, ortho cleared pt for heparin/asa   Intervention Category Major Interventions: Respiratory failure - evaluation and management;Hypoxemia - evaluation and management  Asencion Noble 08/22/2013, 6:19 PM

## 2013-08-22 NOTE — Progress Notes (Signed)
ABG result called to [place], [place]. Continue to monitor patient.

## 2013-08-22 NOTE — Progress Notes (Signed)
Patient with O2 saturation continues to drop to 80-86% on O2 2L/M. Increased O2 to 4L/M /raised HOB all the way up and o2 sat. 98% - drops if patient moves in bed. Lungs diminished audible expiratory wheeze.Dr Percell Miller office called and returned call by Elnita Maxwell, [place]. Orders received.

## 2013-08-22 NOTE — Transfer of Care (Signed)
Immediate Anesthesia Transfer of Care Note  Patient: [place]  Procedure(s) Performed: Procedure(s): OPEN REDUCTION INTERNAL FIXATION (ORIF) TIBIAL PLATEAU (Left)  Patient Location: PACU  Anesthesia Type:General  Level of Consciousness: awake, alert  and oriented  Airway & Oxygen Therapy: Patient Spontanous Breathing and Patient connected to face mask oxygen  Post-op Assessment: Report given to PACU RN, Post -op Vital signs reviewed and stable and Patient moving all extremities X 4  Post vital signs: Reviewed and stable  Complications: No apparent anesthesia complications

## 2013-08-22 NOTE — Preoperative (Signed)
Beta Blockers   Reason not to administer Beta Blockers:Not Applicable 

## 2013-08-22 NOTE — Anesthesia Procedure Notes (Addendum)
Anesthesia Regional Block:  Femoral nerve block  Pre-Anesthetic Checklist: ,, timeout performed, Correct Patient, Correct Site, Correct Laterality, Correct Procedure,, site marked, risks and benefits discussed, Surgical consent,  Pre-op evaluation,  At surgeon's request and post-op pain management  Laterality: Left  Prep: chloraprep       Needles:  Injection technique: Single-shot  Needle Type: Echogenic Stimulator Needle     Needle Length: 5cm 5 cm Needle Gauge: 22 and 22 G    Additional Needles:  Procedures: ultrasound guided (picture in chart) and nerve stimulator Femoral nerve block  Nerve Stimulator or Paresthesia:  Response: quadraceps contraction, 0.45 mA,   Additional Responses:   Narrative:  Start time: 08/22/2013 10:57 AM End time: 08/22/2013 11:08 AM Injection made incrementally with aspirations every 5 mL.  Performed by: Personally  Anesthesiologist: Earnest Bailey, MD  Additional Notes: Functioning IV was confirmed and monitors were applied.  A 13m 22ga Arrow echogenic stimulator needle was used. Sterile prep and drape,hand hygiene and sterile gloves were used. Ultrasound guidance: relevant anatomy identified, needle position confirmed, local anesthetic spread visualized around nerve(s)., vascular puncture avoided.  Image printed for medical record. Negative aspiration and negative test dose prior to incremental administration of local anesthetic. The patient tolerated the procedure well.    Femoral nerve block

## 2013-08-22 NOTE — Anesthesia Preprocedure Evaluation (Addendum)
Anesthesia Evaluation  Patient identified by MRN, date of birth, ID band Patient awake    Reviewed: Allergy & Precautions, H&P , NPO status , Patient's Chart, lab work & pertinent test results  History of Anesthesia Complications Negative for: history of anesthetic complications  Airway Mallampati: II TM Distance: >3 FB Neck ROM: Full    Dental  (+) Teeth Intact, Poor Dentition and Dental Advisory Given   Pulmonary neg pulmonary ROS, shortness of breath and with exertion,    Pulmonary exam normal       Cardiovascular hypertension, Pt. on home beta blockers + CAD, + Past MI and + Cardiac Stents  NSTEMI 08/2011:  Circle 08/21/11: mLAD 60-70%, pCFX occluded, dRCA chronic occlusion with L-R collats, EF 40-45%, inf AK.    Neuro/Psych PSYCHIATRIC DISORDERS Depression negative neurological ROS     GI/Hepatic Neg liver ROS, GERD-  Medicated,  Endo/Other  negative endocrine ROS  Renal/GU negative Renal ROS     Musculoskeletal  (+) Fibromyalgia -, narcotic dependent  Abdominal   Peds  Hematology   Anesthesia Other Findings   Reproductive/Obstetrics                           Anesthesia Physical Anesthesia Plan  ASA: III  Anesthesia Plan: General   Post-op Pain Management:    Induction: Intravenous  Airway Management Planned: LMA  Additional Equipment:   Intra-op Plan:   Post-operative Plan: Extubation in OR  Informed Consent: I have reviewed the patients History and Physical, chart, labs and discussed the procedure including the risks, benefits and alternatives for the proposed anesthesia with the patient or authorized representative who has indicated his/her understanding and acceptance.   Dental advisory given  Plan Discussed with: CRNA, Anesthesiologist and Surgeon  Anesthesia Plan Comments:         Anesthesia Quick Evaluation

## 2013-08-22 NOTE — Interval H&P Note (Signed)
History and Physical Interval Note:  08/22/2013 6:53 AM  Elizabeth [name]  has presented today for surgery, with the diagnosis of Left plateau fracture  The various methods of treatment have been discussed with the patient and family. After consideration of risks, benefits and other options for treatment, the patient has consented to  Procedure(s): OPEN REDUCTION INTERNAL FIXATION (ORIF) TIBIAL PLATEAU (Left) as a surgical intervention .  The patient's history has been reviewed, patient examined, no change in status, stable for surgery.  I have reviewed the patient's chart and labs.  Questions were answered to the patient's satisfaction.     Dailan Pfalzgraf, D

## 2013-08-22 NOTE — H&P (View-Only) (Signed)
   ORTHOPAEDIC CONSULTATION  REQUESTING PHYSICIAN: Elizabeth A Butcher, MD  Chief Complaint: Left tibial plateau fracture  HPI: Elizabeth [name] is a 56 y.o. female who complains of  Left knee pain  Past Medical History  Diagnosis Date  . Hypertension   . HLD (hyperlipidemia)     Chol = 235, LDL = 156 (08/2010)  . Substance abuse     cocaine   . Depression   . Lupus 2009    ANA + 10/2008, repeat ANA + (05/2009), on that visit 05/2009 following labs obtained RF <20, CRP <0.4, ANA titers 1:80,   . CAD (coronary artery disease)     NSTEMI 08/2011:  LHC 08/21/11: mLAD 60-70%, pCFX occluded, dRCA chronic occlusion with L-R collats, EF 40-45%, inf AK.  PCI:  BMS to CFX.  . Pulmonary nodules 05/2008    noted on CXR and CT 05/2008, repeat CT 10/2008 - Stable small bilateral pulmonary nodules measuring up to 6 m m  . Insomnia   . GERD (gastroesophageal reflux disease)   . Fibroids   . History of microcytic hypochromic anemia   . Fibromyalgia   . Myocardial infarction    Past Surgical History  Procedure Laterality Date  . Abdominal hysterectomy  05/2003  . Colonoscopy N/A 07/14/2013    Procedure: COLONOSCOPY;  Surgeon: Patrick D Hung, MD;  Location: [facility];  Service: Endoscopy;  Laterality: N/A;  . Stent on chest     History   Social History  . Marital Status: Divorced    Spouse Name: N/A    Number of Children: N/A  . Years of Education: N/A   Social History Main Topics  . Smoking status: Current Some Day Smoker -- 0.30 packs/day    Types: Cigarettes  . Smokeless tobacco: None     Comment: 3 cigs per day- given QUIT line info  . Alcohol Use: 3.5 oz/week    7 drink(s) per week     Comment: beer 1-2 per day  . Drug Use: No  . Sexual Activity: Not Currently   Other Topics Concern  . None   Social History Narrative  . None   Family History  Problem Relation Age of Onset  . Heart disease Mother   . Cervical cancer Sister   . Prostate cancer Brother   .  Colon cancer Neg Hx    No Known Allergies Prior to Admission medications   Medication Sig Start Date End Date Taking? Authorizing Provider  aspirin EC 81 MG tablet Take 1 tablet (81 mg total) by mouth daily. 05/15/13  Yes Na Li, MD  carvedilol (COREG) 6.25 MG tablet Take 1 tablet (6.25 mg total) by mouth 2 (two) times daily. 06/13/13 06/13/14 Yes Shilpa Bhardwaj, MD  furosemide (LASIX) 20 MG tablet Take 1 tablet (20 mg total) by mouth daily. 05/15/13  Yes Na Li, MD  omeprazole (PRILOSEC OTC) 20 MG tablet Take 1 tablet (20 mg total) by mouth daily. 06/13/13 06/13/14 Yes Shilpa Bhardwaj, MD  rosuvastatin (CRESTOR) 40 MG tablet Take 1 tablet (40 mg total) by mouth daily. 05/15/13 05/15/14 Yes Na Li, MD  zolpidem (AMBIEN) 10 MG tablet Take 1 tablet (10 mg total) by mouth at bedtime as needed for sleep. 06/13/13  Yes Shilpa Bhardwaj, MD  oxyCODONE-acetaminophen (PERCOCET) 10-325 MG per tablet Take 0.5-1 tablets by mouth every 4 (four) hours as needed for pain. 08/15/13   Abigail Harris, PA-C   Dg Chest 2 View  08/14/2013   CLINICAL DATA:  Preop.    EXAM: CHEST  2 VIEW  COMPARISON:  05/12/2013  FINDINGS: Heart it is borderline in size. Lungs are clear. No effusions or acute bony abnormality.  IMPRESSION: No acute cardiopulmonary disease.   Electronically Signed   By: Kevin  Dover M.D.   On: 08/14/2013 22:14   Dg Knee 2 Views Left  08/14/2013   CLINICAL DATA:  Fall, knee pain.  EXAM: LEFT KNEE - 1-2 VIEW  COMPARISON:  None.  FINDINGS: FRACTURE NOTED THROUGH THE PROXIMAL TIBIA EXTENDING ACROSS THE ENTIRE PROXIMAL TIBIA METAPHYSIS. THERE IS PRESUMED EXTENSION TO THE KNEE JOINT AS THERE IS A LARGE JOINT EFFUSION, BUT DIFFICULT TO SEE ON PLAIN FILMS WITH THE LIMITED POSITIONING DUE TO THE PATIENT'S CONDITION. NO VISIBLE FEMUR OR FIBULA ABNORMALITY.  IMPRESSION: PROXIMAL TIBIAL FRACTURE WITH PRESUMED EXTENSION INTO THE JOINT SPACE. LARGE JOINT EFFUSION.   Electronically Signed   By: Kevin  Dover M.D.   On: 08/14/2013 21:20    Ct Knee Left Wo Contrast  08/15/2013   CLINICAL DATA:  Fell. Injured knee. Evaluate tibial plateau fracture.  EXAM: CT OF THE LEFT KNEE WITHOUT CONTRAST  TECHNIQUE: Multidetector CT imaging was performed according to the standard protocol. Multiplanar CT image reconstructions were also generated.  COMPARISON:  Radiographs 08/14/2013.  FINDINGS: There is a comminuted and depressed tibial plateau fracture. The main component is a transverse fracture involving both the medial and lateral plateau. The lateral portion is not significantly displaced or depressed. The medial component does demonstrate depression and impaction along the medial cortex. There is also a transverse fracture through the fibular head. The femur is intact. The patella is normal.  There is an associated lipohemarthrosis. The cruciate and collateral ligaments appear to be intact. The patellar and quadriceps tendons are intact.  IMPRESSION: Comminuted and depressed tibial plateau fracture more significantly involving the medial plateau and head.  Nondisplaced transverse fracture through the fibular head.   Electronically Signed   By: Mark  Gallerani M.D.   On: 08/15/2013 00:10    Positive ROS: All other systems have been reviewed and were otherwise negative with the exception of those mentioned in the HPI and as above.  Labs cbc  Recent Labs  08/14/13 2300  WBC 9.1  HGB 12.3  HCT 39.1  PLT 272    Labs inflam No results found for this basename: ESR, CRP,  in the last 72 hours  Labs coag  Recent Labs  08/15/13 0705  INR 0.92     Recent Labs  08/14/13 2300 08/15/13 0450  NA 140 139  K 3.4* 3.7  CL 104 103  CO2 25 24  GLUCOSE 103* 112*  BUN 14 15  CREATININE 0.80 0.81  CALCIUM 8.7 8.9    Physical Exam: Filed Vitals:   08/15/13 0440  BP: 162/90  Pulse: 88  Temp: 98.1 F (36.7 C)  Resp: 20   General: Alert, no acute distress Cardiovascular: No pedal edema Respiratory: No cyanosis, no use of  accessory musculature GI: No organomegaly, abdomen is soft and non-tender Skin: No lesions in the area of chief complaint Neurologic: Sensation intact distally Psychiatric: Patient is competent for consent with normal mood and affect Lymphatic: No axillary or cervical lymphadenopathy  MUSCULOSKELETAL:  LLE: pain with ROM SILT DP/SP/S/S/T nerve, 2+ DP, +TA/GS/EHL Compartments soft RUE: abrasion over elbow with no underlying crepitous or ROM. Distally NVI Other extremities are atraumatic with painless ROM and NVI.  Assessment: Left tibial plateau fx.   Plan: She will need ORIF of her plateau once swelling   and inflamation has subsided (likely 10/7)       -this can be done as an outpatient.  Weight Bearing Status: NWB LLE PT/OT for mobility and home planning VTE px: SCD's and Lovenox, please hold the morning of surgery.    Abigaile Rossie, D, MD Cell [phone]   08/15/2013 8:56 AM     

## 2013-08-23 DIAGNOSIS — R0609 Other forms of dyspnea: Secondary | ICD-10-CM

## 2013-08-23 DIAGNOSIS — J810 Acute pulmonary edema: Secondary | ICD-10-CM | POA: Diagnosis not present

## 2013-08-23 DIAGNOSIS — R0989 Other specified symptoms and signs involving the circulatory and respiratory systems: Secondary | ICD-10-CM

## 2013-08-23 DIAGNOSIS — I369 Nonrheumatic tricuspid valve disorder, unspecified: Secondary | ICD-10-CM | POA: Diagnosis not present

## 2013-08-23 DIAGNOSIS — I2510 Atherosclerotic heart disease of native coronary artery without angina pectoris: Secondary | ICD-10-CM

## 2013-08-23 DIAGNOSIS — J9600 Acute respiratory failure, unspecified whether with hypoxia or hypercapnia: Secondary | ICD-10-CM | POA: Diagnosis not present

## 2013-08-23 DIAGNOSIS — I5023 Acute on chronic systolic (congestive) heart failure: Secondary | ICD-10-CM | POA: Diagnosis not present

## 2013-08-23 LAB — BASIC METABOLIC PANEL
BUN: 8 mg/dL (ref 6–23)
CO2: 29 mEq/L (ref 19–32)
Calcium: 9.1 mg/dL (ref 8.4–10.5)
Creatinine, Ser: 0.74 mg/dL (ref 0.50–1.10)
GFR calc non Af Amer: >90 mL/min (ref >90)
Glucose, Bld: 96 mg/dL (ref 70–99)
Sodium: 137 mEq/L (ref 135–145)

## 2013-08-23 LAB — CBC
HCT: 33.7 % — ABNORMAL LOW (ref 36.0–46.0)
MCH: 28.6 pg (ref 26.0–34.0)
MCHC: 32.6 g/dL (ref 30.0–36.0)
RDW: 15.4 % (ref 11.5–15.5)

## 2013-08-23 MED ORDER — LORAZEPAM 2 MG/ML IJ SOLN: 2.0000 mg | Freq: Three times a day (TID) | INTRAMUSCULAR | Status: DC | PRN

## 2013-08-23 NOTE — Progress Notes (Signed)
Patient ID: Elizabeth [name], female   DOB: 09-15-1956, 57 y.o.   MRN: [id number]   SUBJECTIVE:  Patient is feeling better today looking better. There was acute respiratory failure. It was felt to be related to flash pulmonary edema from hypertensive disease. There is some left jugular dysfunction by history. Troponins are normal so far. Followup 2-D echo is pending.   Filed Vitals:   08/23/13 0746 08/23/13 0800 08/23/13 0900 08/23/13 1000  BP: 149/83 133/77 133/67 158/96  Pulse:  82 84 79  Temp:  98.7 F (37.1 C)    TempSrc:  Oral    Resp:  _0 Height:      Weight:      SpO2:  93% 97% 95%    Intake/Output Summary (Last 24 hours) at 08/23/13 1057 Last data filed at 08/23/13 1000  Gross per 24 hour  Intake   1700 ml  Output   1875 ml  Net   -175 ml    LABS: Basic Metabolic Panel:  Recent Labs  08/22/13 1054 08/22/13 1920 08/23/13 0640  NA 140  --  137  K 3.8  --  4.2  CL 102  --  100  CO2 28  --  29  GLUCOSE 102*  --  96  BUN 9  --  8  CREATININE 0.67  --  0.74  CALCIUM 9.2  --  9.1  MG  --  1.8  --    Liver Function Tests:  Recent Labs  08/22/13 1054  AST 20  ALT 30  ALKPHOS 274*  BILITOT 0.4  PROT 7.2  ALBUMIN 2.9*   No results found for this basename: LIPASE, AMYLASE,  in the last 72 hours CBC:  Recent Labs  08/22/13 1054 08/23/13 0640  WBC 7.6 8.5  HGB 11.2* 11.0*  HCT 34.5* 33.7*  MCV 88.0 87.5  PLT 320 335   Cardiac Enzymes:  Recent Labs  08/22/13 2052 08/23/13 0230 08/23/13 0918  TROPONINI <0.30 <0.30 <0.30   BNP: No components found with this basename: POCBNP,  D-Dimer: No results found for this basename: DDIMER,  in the last 72 hours Hemoglobin A1C: No results found for this basename: HGBA1C,  in the last 72 hours Fasting Lipid Panel: No results found for this basename: CHOL, HDL, LDLCALC, TRIG, CHOLHDL, LDLDIRECT,  in the last 72 hours Thyroid Function Tests: No results found for this basename: TSH, T4TOTAL,  FREET3, T3FREE, THYROIDAB,  in the last 72 hours  RADIOLOGY: Dg Chest 2 View  08/14/2013   CLINICAL DATA:  Preop.  EXAM: CHEST  2 VIEW  COMPARISON:  05/12/2013  FINDINGS: Heart it is borderline in size. Lungs are clear. No effusions or acute bony abnormality.  IMPRESSION: No acute cardiopulmonary disease.   Electronically Signed   By: Rolm Baptise M.D.   On: 08/14/2013 22:14   Dg Knee 1-2 Views Left  08/22/2013   CLINICAL DATA:  Tibial plateau fracture  EXAM: LEFT KNEE - 1-2 VIEW; DG C-ARM 1-60 MIN  COMPARISON:  Multiple priors.  FINDINGS: C-arm films document open reduction internal fixation of a tibial plateau fracture. Lateral locking plate and screw device was used. Improved position and alignment.  IMPRESSION: As above.   Electronically Signed   By: Rolla Flatten M.D.   On: 08/22/2013 15:23   Dg Knee 2 Views Left  08/14/2013   CLINICAL DATA:  Fall, knee pain.  EXAM: LEFT KNEE - 1-2 VIEW  COMPARISON:  None.  FINDINGS: FRACTURE NOTED THROUGH  THE PROXIMAL TIBIA EXTENDING ACROSS THE ENTIRE PROXIMAL TIBIA METAPHYSIS. THERE IS PRESUMED EXTENSION TO THE KNEE JOINT AS THERE IS A LARGE JOINT EFFUSION, BUT DIFFICULT TO SEE ON PLAIN FILMS WITH THE LIMITED POSITIONING DUE TO THE PATIENT'S CONDITION. NO VISIBLE FEMUR OR FIBULA ABNORMALITY.  IMPRESSION: PROXIMAL TIBIAL FRACTURE WITH PRESUMED EXTENSION INTO THE JOINT SPACE. LARGE JOINT EFFUSION.   Electronically Signed   By: Rolm Baptise M.D.   On: 08/14/2013 21:20   Ct Knee Left Wo Contrast  08/15/2013   CLINICAL DATA:  [place]. Injured knee. Evaluate tibial plateau fracture.  EXAM: CT OF THE LEFT KNEE WITHOUT CONTRAST  TECHNIQUE: Multidetector CT imaging was performed according to the standard protocol. Multiplanar CT image reconstructions were also generated.  COMPARISON:  Radiographs 08/14/2013.  FINDINGS: There is a comminuted and depressed tibial plateau fracture. The main component is a transverse fracture involving both the medial and lateral plateau. The  lateral portion is not significantly displaced or depressed. The medial component does demonstrate depression and impaction along the medial cortex. There is also a transverse fracture through the fibular head. The femur is intact. The patella is normal.  There is an associated lipohemarthrosis. The cruciate and collateral ligaments appear to be intact. The patellar and quadriceps tendons are intact.  IMPRESSION: Comminuted and depressed tibial plateau fracture more significantly involving the medial plateau and head.  Nondisplaced transverse fracture through the fibular head.   Electronically Signed   By: Kalman Jewels M.D.   On: 08/15/2013 00:10   Ct Abdomen Pelvis W Contrast  08/08/2013   CLINICAL DATA:  Right upper quadrants abdominal pain. Epigastric pain.  EXAM: CT ABDOMEN AND PELVIS WITH CONTRAST  TECHNIQUE: Multidetector CT imaging of the abdomen and pelvis was performed using the standard protocol following bolus administration of intravenous contrast.  CONTRAST:  129m OMNIPAQUE IOHEXOL 300 MG/ML  SOLN  COMPARISON:  None.  FINDINGS: A 0.5 x 0.3 cm nodule in the right lower lobe is blurred by motion artifact on image 3 of series 3. The liver, spleen, pancreas, and adrenal glands appear unremarkable. The common bile duct measures up to 8 mm, mildly abnormally dilated. Gallbladder unremarkable.  A gastrohepatic ligament lymph node measures 1.3 cm in short axis on image 13 of series 2  An 8 mm right and a 7 mm left kidney lower pole fluid density renal lesion probably represent cyst although are technically nonspecific due to small size. Kidneys otherwise unremarkable.  Aortoiliac atherosclerotic vascular disease. Appendix normal. Orally administered contrast extends through to the colon.  Uterus absent. There is a trace amount of free pelvic fluid eccentric to the right. Urinary bladder unremarkable. The cecum sits down in the pelvis.  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 This is probably chronic. Equivocal tiny focus of similar AVN posteriorly in the right femoral head  Mild lower lumbar spondylosis and degenerative disc disease.  IMPRESSION: 1. Mild abnormal dilation of the common bile duct, although this seems to have potentially been present on prior CT exams back through 2009. 2. Small right lower lobe pulmonary nodule, no change from 06/14/2008, considered benign. 3. The mildly enlarged gastrohepatic lymph node is observed. This previously measured up to 10 mm in short axis and currently measures 13 mm. This could represent a reactive lymph node although is technically nonspecific. 4. Trace free pelvic fluid eccentric to the right. 5. 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 6. Lower lumbar spondylosis and degenerative disc disease. 7. Atherosclerosis. 8. Small hypodense lesions of the lower poles of each kidney likely represent cysts but are technically nonspecific due to small size.   Electronically Signed   By: Sherryl Barters   On: 08/08/2013 13:49   Dg Chest Port 1 View  08/22/2013   CLINICAL DATA:  Shortness of breath.  EXAM: PORTABLE CHEST - 1 VIEW  COMPARISON:  Chest radiograph performed 08/14/2013  FINDINGS: The lungs are well-aerated. Mild chronic vascular congestion is noted. There is no evidence of focal opacification, pleural effusion or pneumothorax.  The cardiomediastinal silhouette is within normal limits. No acute osseous abnormalities are seen.  IMPRESSION: Mild chronic vascular congestion noted; lungs otherwise clear.   Electronically Signed   By: Garald Balding M.D.   On: 08/22/2013 21:31   Dg Knee Left Port  08/22/2013   CLINICAL DATA:  Postop tibial plateau fracture ORIF  EXAM: PORTABLE LEFT KNEE - 1-2 VIEW  COMPARISON:  08/22/2013 at 10/2019  FINDINGS: There is a comminuted tibial plateau fracture. There is a lateral compression plate, and fracture fragments demonstrate near  anatomic position with no significant depression. There is also a nondisplaced oblique fracture involving the proximal fibula.  IMPRESSION: ORIF proximal tibial fracture.   Electronically Signed   By: Skipper Cliche M.D.   On: 08/22/2013 14:53   Dg C-arm 1-60 Min  08/22/2013   CLINICAL DATA:  Tibial plateau fracture  EXAM: LEFT KNEE - 1-2 VIEW; DG C-ARM 1-60 MIN  COMPARISON:  Multiple priors.  FINDINGS: C-arm films document open reduction internal fixation of a tibial plateau fracture. Lateral locking plate and screw device was used. Improved position and alignment.  IMPRESSION: As above.   Electronically Signed   By: Rolla Flatten M.D.   On: 08/22/2013 15:23    PHYSICAL EXAM  patient is oriented to person time and place. Affect is normal. There is no jugulovenous distention. Lungs reveals few scattered rhonchi. Cardiac exam reveals S1 and S2. The abdomen is soft. There is no peripheral edema.   TELEMETRY: I have personally reviewed telemetry today June 23, 2013. There is normal sinus rhythm with rare PVCs.   ASSESSMENT AND PLAN:    Acute respiratory failure with hypoxia     Patient is improving.    HYPERTENSION    Blood pressure is more stable now.    CORONARY ARTERY DISEASE      There is a history of some coronary artery disease. Troponins are normal. 2-D echo is pending. No plan for aggressive cardiac evaluation this admission unless there is a new change in the wall motion    LUPUS    Ischemic cardiomyopathy     Ejection fraction was said to be in the range of 40-45% with catheterization in 2012. Echo is to be done this admission.    Fracture of left tibial plateau  Dola Argyle 08/23/2013 10:57 AM

## 2013-08-23 NOTE — Care Management (Signed)
[place] has been providing services to [facility] since prior to her surgery. We were providing PT. I will follow for discharge needs. Christa See RN BSN [facility]

## 2013-08-23 NOTE — Evaluation (Signed)
Occupational Therapy Evaluation Patient Details Name: Elizabeth [name] MRN: [id number] DOB: 06/06/1956 Today's Date: 08/23/2013 Time: 3825-0539 OT Time Calculation (min): 21 min  OT Assessment / Plan / Recommendation History of present illness Pt s/p ORIF Left tib plateau fx.    Clinical Impression   Pt s/p ORIF left tib plateau fx.  Pt very lethargic during session (except during mobility when she became very agitated with pain) and unable to state whether she will have 24/7 supervision/assist at home.  Pt requiring +2 total assist for mobility and does live alone at home.  Will recommend SNF for d/c planning to further progress rehab before eventual return home.    OT Assessment  Patient needs continued OT Services    Follow Up Recommendations  SNF;Supervision/Assistance - 24 hour    Barriers to Discharge Decreased caregiver support Pt lives alone. Did not specify if she has assist at home.  Equipment Recommendations  3 in 1 bedside comode    Recommendations for Other Services    Frequency  Min 2X/week    Precautions / Restrictions Precautions Precautions: Fall;Knee Required Braces or Orthoses: Knee Immobilizer - Left Restrictions Weight Bearing Restrictions: Yes LLE Weight Bearing: Non weight bearing   Pertinent Vitals/Pain See vitals    ADL  Upper Body Dressing: Performed;Minimal assistance Where Assessed - Upper Body Dressing: Supported sitting Lower Body Dressing: Performed;+1 Total assistance Where Assessed - Lower Body Dressing: Supine, head of bed up Toilet Transfer: Simulated;+2 Total assistance Toilet Transfer: Patient Percentage: 60% Toilet Transfer Method: Stand pivot Toilet Transfer Equipment:  (bed<>chair) Equipment Used: Knee Immobilizer;Rolling walker;Gait belt Transfers/Ambulation Related to ADLs: +2 assist for balance while maintaining NWB'ing on LLE and while taking pivotal hops on R LE.  ADL Comments: Pt very lethargic on arrival.  Became  increasingly aroused and agitated (due to pain) with bed mobility and transfer. Once pt sitting in chair, she quickly became lethargic and unable to stay awake (likely due to meds received from RN just prior to OT/PT arrival).     OT Diagnosis: Generalized weakness;Acute pain  OT Problem List: Decreased strength;Decreased activity tolerance;Impaired balance (sitting and/or standing);Pain;Decreased knowledge of use of DME or AE;Decreased knowledge of precautions OT Treatment Interventions: Self-care/ADL training;DME and/or AE instruction;Therapeutic activities;Patient/family education;Balance training   OT Goals(Current goals can be found in the care plan section) Acute Rehab OT Goals Patient Stated Goal: did not state OT Goal Formulation: With patient Time For Goal Achievement: 09/06/13 Potential to Achieve Goals: Good  Visit Information  Last OT Received On: 08/23/13 Assistance Needed: +2 History of Present Illness: Pt is a 57 y/o female admitted for tibial plateau fracture after falling off her neighbor's porch. ORIF planned for August 22, 2013.        Prior [place] expects to be discharged to:: Private residence Living Arrangements: Alone Type of Home: House Home Access: Stairs to enter [employer] of Steps: 3-4 Home Layout: Two level;1/2 bath on main level Additional Comments: Pt very lethargic and not able to provide detailed home living info. Prior Function Level of Independence: Independent         Vision/Perception     Cognition  Cognition Arousal/Alertness: Lethargic Behavior During Therapy: Agitated (during mobility due to pain) Overall Cognitive Status: Difficult to assess Difficult to assess due to: Level of arousal    Extremity/Trunk Assessment Upper Extremity Assessment Upper Extremity Assessment: Overall WFL for tasks assessed     Mobility Bed Mobility Bed Mobility: Supine to Sit;Sitting -  Scoot to Edge  of Bed Supine to Sit: 3: Mod assist;HOB elevated;With rails Sitting - Scoot to Edge of Bed: 4: Min assist Details for Bed Mobility Assistance: Pt requiring increased time due to LLE pain.  Assist to support LLE OOB. Directional cueing for technique. Transfers Transfers: Sit to Stand;Stand to Sit Sit to Stand: 1: +2 Total assist;From bed;With upper extremity assist Sit to Stand: Patient Percentage: 60% Stand to Sit: 4: Min assist;To chair/3-in-1;With armrests;With upper extremity assist Details for Transfer Assistance: Assist for power up and balance while maintaining L LE NWB'ing status. VCs for safe hand placement and technique.     Exercise     Balance     End of Session OT - End of Session Equipment Utilized During Treatment: Gait belt;Left knee immobilizer Activity Tolerance: Patient limited by lethargy;Patient limited by pain Patient left: in chair;with call bell/phone within reach Nurse Communication: Mobility status  GO    08/23/2013 Darrol Jump OTR/L Pager [phone] Office [phone]  Darrol Jump 08/23/2013, 10:47 AM

## 2013-08-23 NOTE — Progress Notes (Signed)
Physical Therapy Evaluation Patient Details Name: SOKHNA CHRISTOPH MRN: [id number] DOB: 21-Jul-1956 Today's Date: 08/23/2013 Time: 1962-2297 PT Time Calculation (min): 20 min  PT Assessment / Plan / Recommendation History of Present Illness  Pt is a 57 y/o female admitted for tibial plateau fracture after falling off her neighbor's porch. ORIF 08/22/13.   Clinical Impression  Pt admitted with above. Pt currently with functional limitations due to the deficits listed below (see PT Problem List). Pt lethargic and not safe overall.  Feel she will need SNF for therapy at d/c.  Pt will benefit from skilled PT to increase their independence and safety with mobility to allow discharge to the venue listed below.      PT Assessment  Patient needs continued PT services    Follow Up Recommendations  SNF;Supervision/Assistance - 24 hour       Barriers to Discharge Inaccessible home environment;Decreased caregiver support      Equipment Recommendations  None recommended by PT         Frequency Min 5X/week    Precautions / Restrictions Precautions Precautions: Fall;Knee Required Braces or Orthoses: Knee Immobilizer - Left Restrictions Weight Bearing Restrictions: Yes LLE Weight Bearing: Non weight bearing   Pertinent Vitals/Pain VSS, some left LE pain      Mobility  Bed Mobility Bed Mobility: Supine to Sit;Sitting - Scoot to Edge of Bed Supine to Sit: 3: Mod assist;HOB elevated;With rails Sitting - Scoot to Edge of Bed: 4: Min assist Details for Bed Mobility Assistance: Pt requiring increased time due to LLE pain.  Assist to support LLE OOB. Directional cueing for technique. Transfers Transfers: Sit to Stand;Stand to Sit;Stand Pivot Transfers Sit to Stand: 1: +2 Total assist;From bed;With upper extremity assist Sit to Stand: Patient Percentage: 60% Stand to Sit: 4: Min assist;To chair/3-in-1;With armrests;With upper extremity assist Stand Pivot Transfers: 1: +2 Total  assist Stand Pivot Transfers: Patient Percentage: 60% Details for Transfer Assistance: Assist for power up and balance while maintaining L LE NWB'ing status. VCs for safe hand placement and technique.Able to step around and maintain NWB left LE for pivot transfer. Ambulation/Gait Ambulation/Gait Assistance: Not tested (comment) Stairs: No Wheelchair Mobility Wheelchair Mobility: No         PT Diagnosis: Difficulty walking;Acute pain  PT Problem List: Decreased strength;Decreased range of motion;Decreased activity tolerance;Decreased balance;Decreased mobility;Decreased knowledge of use of DME;Decreased safety awareness;Pain PT Treatment Interventions: DME instruction;Gait training;Stair training;Functional mobility training;Therapeutic activities;Therapeutic exercise;Neuromuscular re-education;Patient/family education     PT Goals(Current goals can be found in the care plan section) Acute Rehab PT Goals Patient Stated Goal: did not state PT Goal Formulation: With patient Time For Goal Achievement: 09/06/13 Potential to Achieve Goals: Fair  Visit Information  Last PT Received On: 08/23/13 Assistance Needed: +2 PT/OT Co-Evaluation/Treatment: Yes History of Present Illness: Pt is a 57 y/o female admitted for tibial plateau fracture after falling off her neighbor's porch. ORIF 08/22/13.        Prior [place] expects to be discharged to:: Private residence Living Arrangements: Alone Available Help at Discharge: Family;Other (Comment) (pt reports sister can stay with her) Type of Home: House Home Access: Stairs to enter [employer] of Steps: 3-4 Home Layout: Two level;1/2 bath on main level Alternate Level Stairs-Number of Steps: 11 Alternate Level Stairs-Rails: Right Home Equipment: Walker - 2 wheels;Bedside commode Additional Comments: Pt very lethargic and not able to provide detailed home living info. Prior Function Level of  Independence: Independent Communication Communication: No difficulties  Cognition  Cognition Arousal/Alertness: Lethargic Behavior During Therapy: Agitated (during mobility due to pain) Overall Cognitive Status: Difficult to assess Difficult to assess due to: Level of arousal    Extremity/Trunk Assessment Upper Extremity Assessment Upper Extremity Assessment: Defer to OT evaluation Lower Extremity Assessment Lower Extremity Assessment: LLE deficits/detail LLE Deficits / Details: Decreased strength and AROM consistent with tibial plateau fracture. Significant pain LLE: Unable to fully assess due to pain;Unable to fully assess due to immobilization Cervical / Trunk Assessment Cervical / Trunk Assessment: Normal   Balance Static Sitting Balance Static Sitting - Balance Support: Bilateral upper extremity supported;Feet supported Static Sitting - Level of Assistance: 5: Stand by assistance Static Sitting - Comment/# of Minutes: 4  End of Session PT - End of Session Equipment Utilized During Treatment: Gait belt Activity Tolerance: Patient limited by pain Patient left: in chair;with call bell/phone within reach Nurse Communication: Mobility status;Need for lift equipment       INGOLD,Geneviene Tesch 08/23/2013, 1:05 PM [facility] Acute Rehabilitation [phone] (pager)

## 2013-08-23 NOTE — Clinical Social Work Psychosocial (Signed)
Clinical Social Work Department BRIEF PSYCHOSOCIAL ASSESSMENT 08/23/2013  Patient:  Elizabeth [name], Elizabeth [name]     Account Number:  [id number]     Admit date:  08/22/2013  Clinical Social Worker:  Freeman Caldron  Date/Time:  08/23/2013 07:15 PM  Referred by:  Physician  Date Referred:  08/23/2013 Referred for  SNF Placement   Other Referral:   Interview type:  Patient Other interview type:    PSYCHOSOCIAL DATA Living Status:  ALONE Admitted from facility:   Level of care:   Primary support name:  Guy Sandifer Primary support relationship to patient:  SIBLING Degree of support available:   Pt has one sister, and was living independently before coming to hospital. Pt was falling asleep during CSW visit and was providing short responses to questions, so degree of support is difficult to determine.    CURRENT CONCERNS Current Concerns  Post-Acute Placement   Other Concerns:    SOCIAL WORK ASSESSMENT / PLAN CSW introduced herself and explained that the medical team is recommending pt go to a SNF. Pt is understanding of CSW role and gave CSW permission to send clinicals to SNFs in [place] was falling asleep while CSW spoke with her, so CSW got minimal information about her support system--pt has one sister.   Assessment/plan status:  Psychosocial Support/Ongoing Assessment of Needs Other assessment/ plan:   Information/referral to community resources:   SNF.    PATIENT'S/FAMILY'S RESPONSE TO PLAN OF CARE: Pt falling asleep during CSW assessment. However, pt gave permission to CSW to fax clinicals to SNFs in [facility]. CSW gave pt a list of facilities and has faxed her clinicals.       Ky Barban, MSW, [facility] Clinical Social Worker [phone]

## 2013-08-23 NOTE — Progress Notes (Signed)
PULMONARY  / CRITICAL CARE MEDICINE  Name: Elizabeth [name] MRN: [id number] DOB: [birth year]    ADMISSION DATE:  08/22/2013 CONSULTATION DATE:  08/22/2013  REFERRING MD :  Manson Passey PRIMARY SERVICE:  PCCM  CHIEF COMPLAINT:  Acute respiratory failure  BRIEF PATIENT DESCRIPTION: 57 yo with past medical history of lupus, CAD and ischemic cardiomyopathy (EF 40%) as well as alcohol and cocaine abuse admitted for elective ORIF of tibial plateau brought to [facility] ICU after developing respiratory distress and increased oxygen requirements. PCCM was consulted.  SIGNIFICANT EVENTS / STUDIES:  10/7  OR >>>ORIF of left tibial plateau fracture 10/7  Respiratory distress, increased oxygen requirements, transferred to ICU  LINES / TUBES:  CULTURES:  ANTIBIOTICS: Cefazolin 10/7 >>>  INTERVAL HISTORY:  VITAL SIGNS: Temp:  [97.2 F (36.2 C)-99.1 F (37.3 C)] 98.7 F (37.1 C) (10/08 0800) Pulse Rate:  [60-88] 79 (10/08 1000) Resp:  [13-26] 20 (10/08 1000) BP: (103-185)/(67-112) 158/96 mmHg (10/08 1000) SpO2:  [90 %-100 %] 95 % (10/08 1000) Weight:  [88.6 kg (195 lb 5.2 oz)] 88.6 kg (195 lb 5.2 oz) (10/08 0500)  HEMODYNAMICS:   VENTILATOR SETTINGS:   INTAKE / OUTPUT: Intake/Output     10/07 0701 - 10/08 0700 10/08 0701 - 10/09 0700   P.O. 180 120   I.V. (mL/kg) 1400 (15.8)    Total Intake(mL/kg) 1580 (17.8) 120 (1.4)   Urine (mL/kg/hr) 1825    Blood 50    Total Output 1875     Net -295 +120         PHYSICAL EXAMINATION: General:  Appears to be in no distress Neuro:  Awake, alert, non focal, anxcious HEENT:  PERRL Cardiovascular:  RRR, no m/r/g Lungs:  Bilateral diminished air entry, no w/r/r Abdomen:  Soft, nontender, bowel sounds diminished Musculoskeletal:  No edema, immobilizer LLE Skin:  Intact  LABS:  Recent Labs Lab 08/22/13 1053 08/22/13 1054 08/22/13 1700 08/22/13 1825 08/22/13 1920 08/22/13 2052 08/23/13 0230 08/23/13 0640  HGB 12.2 11.2*  --   --   --   --    --  11.0*  WBC  --  7.6  --   --   --   --   --  8.5  PLT  --  320  --   --   --   --   --  335  NA 142 140  --   --   --   --   --  137  K 3.9 3.8  --   --   --   --   --  4.2  CL  --  102  --   --   --   --   --  100  CO2  --  28  --   --   --   --   --  29  GLUCOSE 106* 102*  --   --   --   --   --  96  BUN  --  9  --   --   --   --   --  8  CREATININE  --  0.67  --   --   --   --   --  0.74  CALCIUM  --  9.2  --   --   --   --   --  9.1  MG  --   --   --   --  1.8  --   --   --   AST  --  20  --   --   --   --   --   --   ALT  --  30  --   --   --   --   --   --   ALKPHOS  --  274*  --   --   --   --   --   --   BILITOT  --  0.4  --   --   --   --   --   --   PROT  --  7.2  --   --   --   --   --   --   ALBUMIN  --  2.9*  --   --   --   --   --   --   TROPONINI  --   --   --  <0.30  --  <0.30 <0.30  --   PROBNP  --   --   --  554.5*  --   --   --   --   PHART  --   --  7.413  --   --   --   --   --   PCO2ART  --   --  43.3  --   --   --   --   --   PO2ART  --   --  67.2*  --   --   --   --   --    No results found for this basename: GLUCAP,  in the last 168 hours  CXR:  10/7 >>> Mild pulmonary vascular congestion, no overt infiltrates  ASSESSMENT / PLAN:  PULMONARY A:  Acute hypoxemic respiratory failure, no improved; likely secondary to flash pulmonary edema in setting of hypertensive emergency.  Doubt PE as rapid clinical improvement. P:   - Goal SpO2>92. - Titrate O2 for sat of 88-92%. - Recommend continuation of patient's home lasix. - Albuterol PRN only.  CARDIOVASCULAR A: CAD in setting of Lupus. Ischemic cardiomyopathy (EF 40-45%). Hypertensive emergency, now improved.  Acute on chronic systolic CHF. Demand ischemia (ST changes in lateral leads, Troponin wnl). P:  - Continue Lipitor, Coreg, ASA - Would defer Heparin gtt for now, defer to cards. - Hydralazine PRN for BP control. - Otherwise per cards.  RENAL A:  Mild hypokalemia. P:   - Trend BMP. - Hold  further K replacement. - Lasix as ordered. - IVF off.  GASTROINTESTINAL A:  No active issues. P:   - Protonix as on PPI preadmission. - Advance diet as tolerated.  HEMATOLOGIC A:  Acute blood loss anemia. P:  - Trend CBC. - SCDs for DVT Px.  INFECTIOUS A:  No active issues. P:   - Perioperative abx per Ortho.  ENDOCRINE  A:  No active issues. P:   - No intervention required.  NEUROLOGIC A:  Post op pain.  Cocaine and alcohol abuse. P:   - Dilaudid / Oxycodone / Tylenol. - CIWA / Ativan PRN. - Folate / Thiamine / MVI.  Transfer to tele, cardiology following, back to ortho service.  PCCM will sign off, please call back if needed.  I have personally obtained history, examined patient, evaluated and interpreted laboratory and imaging results, reviewed medical records, formulated assessment / plan and placed orders.  Jennet Maduro, MD Pulmonary and [place] Pager: [phone]  08/23/2013, 10:31 AM

## 2013-08-23 NOTE — Progress Notes (Signed)
Subjective:  Patient reports pain as mod-severe  Objective:   VITALS:   Filed Vitals:   08/23/13 1300 08/23/13 1400 08/23/13 1500 08/23/13 1551  BP: 133/78 154/83 136/74 157/81  Pulse: 82 84 80 82  Temp:    98.7 F (37.1 C)  TempSrc:    Oral  Resp: _0 Height:      Weight:      SpO2: 93% 100% 97% 100%    Physical Exam Block still in place  Dressing: C/D/I  Compartments soft Wiggles toes, decreased sensation 2/2 block.  LABS  Results for orders placed during the hospital encounter of 08/22/13 (from the past 24 hour(s))  TROPONIN I     Status: None   Collection Time    08/22/13  6:25 PM      Result Value Range   Troponin I <0.30  <0.30 ng/mL  PRO B NATRIURETIC PEPTIDE     Status: Abnormal   Collection Time    08/22/13  6:25 PM      Result Value Range   Pro B Natriuretic peptide (BNP) 554.5 (*) 0 - 125 pg/mL  MAGNESIUM     Status: None   Collection Time    08/22/13  7:20 PM      Result Value Range   Magnesium 1.8  1.5 - 2.5 mg/dL  MRSA PCR SCREENING     Status: Abnormal   Collection Time    08/22/13  7:32 PM      Result Value Range   MRSA by PCR POSITIVE (*) NEGATIVE  TROPONIN I     Status: None   Collection Time    08/22/13  8:52 PM      Result Value Range   Troponin I <0.30  <0.30 ng/mL  TROPONIN I     Status: None   Collection Time    08/23/13  2:30 AM      Result Value Range   Troponin I <0.30  <0.30 ng/mL  CBC     Status: Abnormal   Collection Time    08/23/13  6:40 AM      Result Value Range   WBC 8.5  4.0 - 10.5 K/uL   RBC 3.85 (*) 3.87 - 5.11 MIL/uL   Hemoglobin 11.0 (*) 12.0 - 15.0 g/dL   HCT 33.7 (*) 36.0 - 46.0 %   MCV 87.5  78.0 - 100.0 fL   MCH 28.6  26.0 - 34.0 pg   MCHC 32.6  30.0 - 36.0 g/dL   RDW 15.4  11.5 - 15.5 %   Platelets 335  150 - 400 K/uL  BASIC METABOLIC PANEL     Status: None   Collection Time    08/23/13  6:40 AM      Result Value Range   Sodium 137  135 - 145 mEq/L   Potassium 4.2  3.5 - 5.1 mEq/L     Chloride 100  96 - 112 mEq/L   CO2 29  19 - 32 mEq/L   Glucose, Bld 96  70 - 99 mg/dL   BUN 8  6 - 23 mg/dL   Creatinine, Ser 0.74  0.50 - 1.10 mg/dL   Calcium 9.1  8.4 - 10.5 mg/dL   GFR calc non Af Amer >90  >90 mL/min   GFR calc Af Amer >90  >90 mL/min  TROPONIN I     Status: None   Collection Time    08/23/13  9:18 AM  Result Value Range   Troponin I <0.30  <0.30 ng/mL     Assessment/Plan: 1 Day Post-Op   Principal Problem:   Acute respiratory failure with hypoxia Active Problems:   HYPERTENSION   CORONARY ARTERY DISEASE   LUPUS   Ischemic cardiomyopathy   Fracture of left tibial plateau   Acute pulmonary edema   Hypertensive emergency   Acute on chronic systolic CHF (congestive heart failure)   PLAN: Weight Bearing: NWB LLE Dressings: Keep C/D/I VTE prophylaxis: [id number] (patient was noncompliant with Lovenox at home). And SCD's Appreciate critical care assistance, awaiting clearance to advance her dispo and dispo recs.  Dispo: AIR vs SNF vs HH when stable   Damichael Hofman, D 08/23/2013, 5:02 PM   Edmonia Lynch, MD Cell [phone]

## 2013-08-23 NOTE — Progress Notes (Signed)
  Echocardiogram 2D Echocardiogram has been performed.  Elizabeth [name], [place] 08/23/2013, 8:11 AM

## 2013-08-23 NOTE — Clinical Social Work Placement (Signed)
Clinical Social Work Department CLINICAL SOCIAL WORK PLACEMENT NOTE 08/23/2013  Patient:  Elizabeth [name], Elizabeth [name]  Account Number:  [id number] Admit date:  08/22/2013  Clinical Social Worker:  Ky Barban, Latanya Presser  Date/time:  08/23/2013 07:22 PM  Clinical Social Work is seeking post-discharge placement for this patient at the following level of care:   SKILLED NURSING   (*CSW will update this form in Epic as items are completed)   08/23/2013  Patient/family provided with [place] Department of Clinical Social Work's list of facilities offering this level of care within the geographic area requested by the patient (or if unable, by the patient's family).  08/23/2013  Patient/family informed of their freedom to choose among providers that offer the needed level of care, that participate in Medicare, Medicaid or managed care program needed by the patient, have an available bed and are willing to accept the patient.  08/23/2013  Patient/family informed of [facility]' ownership interest in [facility], as well as of the fact that they are under no obligation to receive care at this facility.  PASARR submitted to EDS on 08/23/2013 PASARR number received from EDS on 08/23/2013  FL2 transmitted to all facilities in geographic area requested by pt/family on  08/23/2013 FL2 transmitted to all facilities within larger geographic area on   Patient informed that his/her managed care company has contracts with or will negotiate with  certain facilities, including the following:     Patient/family informed of bed offers received:   Patient chooses bed at  Physician recommends and patient chooses bed at    Patient to be transferred to  on   Patient to be transferred to facility by   The following physician request were entered in Epic:   Additional Comments:   Ky Barban, MSW, [place] Social Worker [phone]

## 2013-08-23 NOTE — Care Management Note (Signed)
Page 1 of 2   08/23/2013     11:59:48 AM   CARE MANAGEMENT NOTE 08/23/2013  Patient:  SKYLLAR, NOTARIANNI   Account Number:  [id number]  Date Initiated:  08/23/2013  Documentation initiated by:  Elissa Hefty  Subjective/Objective Assessment:   adm w resp failure     Action/Plan:   lives alone, act w [facility] for hhpt   Anticipated DC Date:     Anticipated DC Plan:    In-house referral  Clinical Environmental education officer  CM consult      [facility] Choice  Resumption Of Svcs/PTA Provider   Choice offered to / List presented to:          [facility] arranged  HH-1 RN  [place]   Status of service:   Medicare Important Message given?   (If response is "NO", the following Medicare IM given date fields will be blank) Date Medicare IM given:   Date Additional Medicare IM given:    Discharge Disposition:    Per UR Regulation:  Reviewed for med. necessity/level of care/duration of stay  If discussed at [place] of Stay Meetings, dates discussed:    Comments:  10/8 1158a debbie Alka Falwell rn,bsn received call from [place]. they were following pt pta. soc work working w pt on short term snf. will follow and assist as needed.

## 2013-08-24 DIAGNOSIS — I5023 Acute on chronic systolic (congestive) heart failure: Secondary | ICD-10-CM | POA: Diagnosis not present

## 2013-08-24 DIAGNOSIS — J810 Acute pulmonary edema: Secondary | ICD-10-CM | POA: Diagnosis not present

## 2013-08-24 DIAGNOSIS — I2510 Atherosclerotic heart disease of native coronary artery without angina pectoris: Secondary | ICD-10-CM | POA: Diagnosis not present

## 2013-08-24 DIAGNOSIS — J9600 Acute respiratory failure, unspecified whether with hypoxia or hypercapnia: Secondary | ICD-10-CM | POA: Diagnosis not present

## 2013-08-24 LAB — CBC
HCT: 33 % — ABNORMAL LOW (ref 36.0–46.0)
Hemoglobin: 11 g/dL — ABNORMAL LOW (ref 12.0–15.0)
MCH: 28.5 pg (ref 26.0–34.0)
MCHC: 33.3 g/dL (ref 30.0–36.0)
MCV: 85.5 fL (ref 78.0–100.0)
Platelets: 318 10*3/uL (ref 150–400)
RBC: 3.86 MIL/uL — ABNORMAL LOW (ref 3.87–5.11)
RDW: 15 % (ref 11.5–15.5)
WBC: 8.7 10*3/uL (ref 4.0–10.5)

## 2013-08-24 MED ORDER — DIAZEPAM 2 MG PO TABS: 2.0000 mg | ORAL_TABLET | Freq: Four times a day (QID) | ORAL | Status: DC | PRN

## 2013-08-24 MED ORDER — VITAMIN B-1 100 MG PO TABS: 100.0000 mg | ORAL_TABLET | Freq: Every day | ORAL | Status: DC

## 2013-08-24 MED ORDER — FOLIC ACID 1 MG PO TABS
1.0000 mg | ORAL_TABLET | Freq: Every day | ORAL | Status: DC
  Administered 2013-08-24: 13:00:00 1 mg via ORAL
  Filled 2013-08-24: qty 1

## 2013-08-24 MED ORDER — LISINOPRIL 2.5 MG PO TABS: 2.5000 mg | ORAL_TABLET | Freq: Every day | ORAL | Status: DC

## 2013-08-24 MED ORDER — LISINOPRIL 2.5 MG PO TABS
2.5000 mg | ORAL_TABLET | Freq: Every day | ORAL | Status: DC
  Administered 2013-08-24: 13:00:00 2.5 mg via ORAL
  Filled 2013-08-24: qty 1

## 2013-08-24 NOTE — Progress Notes (Signed)
Patient refusing to wear knee brace.

## 2013-08-24 NOTE — Progress Notes (Signed)
Patient's IV and telemetry has been discontinued; patient verbalizes understanding of discharge instructions and will be discharged home with sister.  Ruben Im RN

## 2013-08-24 NOTE — Progress Notes (Signed)
Subjective: Denies CP  Breathing is fair  Wants to go home.   Objective: Filed Vitals:   08/23/13 1551 08/23/13 1700 08/23/13 2050 08/24/13 0613  BP: 157/81 150/83 137/77 145/81  Pulse: 82 82 79 90  Temp: 98.7 F (37.1 C)   100.1 F (37.8 C)  TempSrc: Oral   Oral  Resp: _0 Height:      Weight:    189 lb 4.8 oz (85.866 kg)  SpO2: 100%  94% 92%   Weight change: -6 lb 0.4 oz (-2.734 kg)  Intake/Output Summary (Last 24 hours) at 08/24/13 0808 Last data filed at 08/24/13 0309  Gross per 24 hour  Intake    480 ml  Output   1675 ml  Net  -1195 ml   Net negative 1.45 L  General: Alert, awake, oriented x3, in no acute distress Neck:  JVP is normal Heart: Regular rate and rhythm, without murmurs, rubs, gallops.  Lungs: Clear to auscultation.  No rales or wheezes. Exemities:  No edema.   Neuro: Grossly intact, nonfocal.  Tele:  SR Lab Results: Results for orders placed during the hospital encounter of 08/22/13 (from the past 24 hour(s))  TROPONIN I     Status: None   Collection Time    08/23/13  9:18 AM      Result Value Range   Troponin I <0.30  <0.30 ng/mL  CBC     Status: Abnormal   Collection Time    08/24/13  7:20 AM      Result Value Range   WBC 8.7  4.0 - 10.5 K/uL   RBC 3.86 (*) 3.87 - 5.11 MIL/uL   Hemoglobin 11.0 (*) 12.0 - 15.0 g/dL   HCT 33.0 (*) 36.0 - 46.0 %   MCV 85.5  78.0 - 100.0 fL   MCH 28.5  26.0 - 34.0 pg   MCHC 33.3  30.0 - 36.0 g/dL   RDW 15.0  11.5 - 15.5 %   Platelets 318  150 - 400 K/uL    Studies/Results: _1 @  Echo 10/8Study Conclusions  - Left ventricle: The cavity size was normal. Wall thickness was increased in a pattern of moderate LVH. Systolic function was normal. The estimated ejection fraction was in the range of 60% to 65%. Wall motion was normal; there were no regional wall motion abnormalities. Features are consistent with a pseudonormal left ventricular filling pattern, with concomitant abnormal relaxation  and increased filling pressure (grade 2 diastolic dysfunction). - Left atrium: The atrium was mildly dilated. - Right atrium: The atrium was mildly dilated. - Pulmonary arteries: Systolic pressure was mildly increased. PA peak pressure: 15m Hg (S).      Medications: Reviewed   _2 @  1.  HTN  BP is improved  Still not optimal  I would add low dose ACE I  Lisinopril 2.5 mg  Follow  Can be done as outpatient.    2.  CAD  I am not convinced on unstable angina.  Patient witn known CAD  Echo with normal LV function.    3.  Lupus  4.  HL  Continue statin  Will make sure that patient has f/u in clinic  OK to move to rehab from cardiac standpoint.  Should  Have BMET in 10 days.     LOS: 2 days   PDorris Carnes10/07/2013, 8:08 AM

## 2013-08-24 NOTE — Discharge Summary (Signed)
Physician Discharge Summary  Patient ID: Elizabeth [name] MRN: [id number] DOB/AGE: 56/23/1957 56 y.o.  Admit date: 08/22/2013 Discharge date: 08/24/2013  Admission Diagnoses:  Acute respiratory failure with hypoxia  Discharge Diagnoses:  Principal Problem:   Acute respiratory failure with hypoxia Active Problems:   HYPERTENSION   CORONARY ARTERY DISEASE   LUPUS   Ischemic cardiomyopathy   Fracture of left tibial plateau   Acute pulmonary edema   Hypertensive emergency   Acute on chronic systolic CHF (congestive heart failure)   Past Medical History  Diagnosis Date  . Hypertension   . HLD (hyperlipidemia)     Chol = 235, LDL = 156 (08/2010)  . Substance abuse     cocaine   . Lupus 2009    ANA + 10/2008, repeat ANA + (05/2009), on that visit 05/2009 following labs obtained RF <20, CRP <0.4, ANA titers 1:80,   . Pulmonary nodules 05/2008    noted on CXR and CT 05/2008, repeat CT 10/2008 - Stable small bilateral pulmonary nodules measuring up to 6 m m  . Insomnia   . GERD (gastroesophageal reflux disease)   . Fibroids   . History of microcytic hypochromic anemia   . Fibromyalgia   . Myocardial infarction   . CAD (coronary artery disease)     NSTEMI 08/2011:  LHC 08/21/11: mLAD 60-70%, pCFX occluded, dRCA chronic occlusion with L-R collats, EF 40-45%, inf AK.  PCI:  BMS to CFX.  Marland Kitchen Depression   . Shortness of breath 08/22/2013    Surgeries: Procedure(s): OPEN REDUCTION INTERNAL FIXATION (ORIF) TIBIAL PLATEAU on 08/22/2013   Consultants (if any): Treatment Team:  Rounding L[facility]cardiology, MD  Discharged Condition: Improved  Hospital Course: Elizabeth [name] is an 57 y.o. female who was admitted 08/22/2013 with a diagnosis of Acute respiratory failure with hypoxia and went to the operating room on 08/22/2013 and underwent the above named procedures.    She was given perioperative antibiotics:  Anti-infectives   Start     Dose/Rate Route Frequency Ordered Stop    08/22/13 1700  ceFAZolin (ANCEF) IVPB 2 g/50 mL premix     2 g 100 mL/hr over 30 Minutes Intravenous Every 6 hours 08/22/13 1557 08/23/13 0600   08/22/13 1013  ceFAZolin (ANCEF) 2-3 GM-% IVPB SOLR    Comments:  Elliott, Beth: cabinet override      08/22/13 1013 08/22/13 1123   08/22/13 0600  ceFAZolin (ANCEF) 2 g in dextrose 5 % 50 mL IVPB  Status:  Discontinued     2 g 140 mL/hr over 30 Minutes Intravenous On call to O.R. 08/21/13 1242 08/22/13 1535    .  She was given sequential compression devices, early ambulation, and aspirin 325 daily for DVT prophylaxis.(she was noncompliant with Lovenox at home last week.)  She benefited maximally from the hospital stay and there were no complications other than volume overload  The Critical care team requested that she restart all home meds and start lisinopril 2.59m Qday  Recent vital signs:  Filed Vitals:   08/24/13 0951  BP: 119/71  Pulse: 86  Temp: 97.8 F (36.6 C)  Resp: 20    Recent laboratory studies:  Lab Results  Component Value Date   HGB 11.0* 08/24/2013   HGB 11.0* 08/23/2013   HGB 11.2* 08/22/2013   Lab Results  Component Value Date   WBC 8.7 08/24/2013   PLT 318 08/24/2013   Lab Results  Component Value Date   INR 0.92 08/15/2013   Lab Results  Component Value Date   NA 137 08/23/2013   K 4.2 08/23/2013   CL 100 08/23/2013   CO2 29 08/23/2013   BUN 8 08/23/2013   CREATININE 0.74 08/23/2013   GLUCOSE 96 08/23/2013    Discharge Medications:     Medication List    STOP taking these medications       enoxaparin 40 MG/0.4ML injection  Commonly known as:  LOVENOX      TAKE these medications       aspirin EC 325 MG tablet  Take 1 tablet (325 mg total) by mouth daily.     carvedilol 6.25 MG tablet  Commonly known as:  COREG  Take 1 tablet (6.25 mg total) by mouth 2 (two) times daily.     diazepam 2 MG tablet  Commonly known as:  VALIUM  Take 1 tablet (2 mg total) by mouth every 6 (six) hours as needed for  anxiety.     furosemide 20 MG tablet  Commonly known as:  LASIX  Take 1 tablet (20 mg total) by mouth daily.     lisinopril 2.5 MG tablet  Commonly known as:  ZESTRIL  Take 1 tablet (2.5 mg total) by mouth daily.     omeprazole 20 MG tablet  Commonly known as:  PRILOSEC OTC  Take 1 tablet (20 mg total) by mouth daily.     oxyCODONE 5 MG immediate release tablet  Commonly known as:  ROXICODONE  Take 2 tablets (10 mg total) by mouth every 4 (four) hours as needed for pain.     rosuvastatin 40 MG tablet  Commonly known as:  CRESTOR  Take 1 tablet (40 mg total) by mouth daily.     zolpidem 10 MG tablet  Commonly known as:  AMBIEN  Take 1 tablet (10 mg total) by mouth at bedtime as needed for sleep.        Diagnostic Studies: Dg Chest 2 View  08/14/2013   CLINICAL DATA:  Preop.  EXAM: CHEST  2 VIEW  COMPARISON:  05/12/2013  FINDINGS: Heart it is borderline in size. Lungs are clear. No effusions or acute bony abnormality.  IMPRESSION: No acute cardiopulmonary disease.   Electronically Signed   By: Rolm Baptise M.D.   On: 08/14/2013 22:14   Dg Knee 1-2 Views Left  08/22/2013   CLINICAL DATA:  Tibial plateau fracture  EXAM: LEFT KNEE - 1-2 VIEW; DG C-ARM 1-60 MIN  COMPARISON:  Multiple priors.  FINDINGS: C-arm films document open reduction internal fixation of a tibial plateau fracture. Lateral locking plate and screw device was used. Improved position and alignment.  IMPRESSION: As above.   Electronically Signed   By: Rolla Flatten M.D.   On: 08/22/2013 15:23   Dg Knee 2 Views Left  08/14/2013   CLINICAL DATA:  Fall, knee pain.  EXAM: LEFT KNEE - 1-2 VIEW  COMPARISON:  None.  FINDINGS: FRACTURE NOTED THROUGH THE PROXIMAL TIBIA EXTENDING ACROSS THE ENTIRE PROXIMAL TIBIA METAPHYSIS. THERE IS PRESUMED EXTENSION TO THE KNEE JOINT AS THERE IS A LARGE JOINT EFFUSION, BUT DIFFICULT TO SEE ON PLAIN FILMS WITH THE LIMITED POSITIONING DUE TO THE PATIENT'S CONDITION. NO VISIBLE FEMUR OR FIBULA  ABNORMALITY.  IMPRESSION: PROXIMAL TIBIAL FRACTURE WITH PRESUMED EXTENSION INTO THE JOINT SPACE. LARGE JOINT EFFUSION.   Electronically Signed   By: Rolm Baptise M.D.   On: 08/14/2013 21:20   Ct Knee Left Wo Contrast  08/15/2013   CLINICAL DATA:  [place]. Injured knee. Evaluate tibial plateau  fracture.  EXAM: CT OF THE LEFT KNEE WITHOUT CONTRAST  TECHNIQUE: Multidetector CT imaging was performed according to the standard protocol. Multiplanar CT image reconstructions were also generated.  COMPARISON:  Radiographs 08/14/2013.  FINDINGS: There is a comminuted and depressed tibial plateau fracture. The main component is a transverse fracture involving both the medial and lateral plateau. The lateral portion is not significantly displaced or depressed. The medial component does demonstrate depression and impaction along the medial cortex. There is also a transverse fracture through the fibular head. The femur is intact. The patella is normal.  There is an associated lipohemarthrosis. The cruciate and collateral ligaments appear to be intact. The patellar and quadriceps tendons are intact.  IMPRESSION: Comminuted and depressed tibial plateau fracture more significantly involving the medial plateau and head.  Nondisplaced transverse fracture through the fibular head.   Electronically Signed   By: Kalman Jewels M.D.   On: 08/15/2013 00:10   Ct Abdomen Pelvis W Contrast  08/08/2013   CLINICAL DATA:  Right upper quadrants abdominal pain. Epigastric pain.  EXAM: CT ABDOMEN AND PELVIS WITH CONTRAST  TECHNIQUE: Multidetector CT imaging of the abdomen and pelvis was performed using the standard protocol following bolus administration of intravenous contrast.  CONTRAST:  164m OMNIPAQUE IOHEXOL 300 MG/ML  SOLN  COMPARISON:  None.  FINDINGS: A 0.5 x 0.3 cm nodule in the right lower lobe is blurred by motion artifact on image 3 of series 3. The liver, spleen, pancreas, and adrenal glands appear unremarkable. The common bile  duct measures up to 8 mm, mildly abnormally dilated. Gallbladder unremarkable.  A gastrohepatic ligament lymph node measures 1.3 cm in short axis on image 13 of series 2  An 8 mm right and a 7 mm left kidney lower pole fluid density renal lesion probably represent cyst although are technically nonspecific due to small size. Kidneys otherwise unremarkable.  Aortoiliac atherosclerotic vascular disease. Appendix normal. Orally administered contrast extends through to the colon.  Uterus absent. There is a trace amount of free pelvic fluid eccentric to the right. Urinary bladder unremarkable. The cecum sits down in the pelvis.  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 This is probably chronic. Equivocal tiny focus of similar AVN posteriorly in the right femoral head  Mild lower lumbar spondylosis and degenerative disc disease.  IMPRESSION: 1. Mild abnormal dilation of the common bile duct, although this seems to have potentially been present on prior CT exams back through 2009. 2. Small right lower lobe pulmonary nodule, no change from 06/14/2008, considered benign. 3. The mildly enlarged gastrohepatic lymph node is observed. This previously measured up to 10 mm in short axis and currently measures 13 mm. This could represent a reactive lymph node although is technically nonspecific. 4. Trace free pelvic fluid eccentric to the right. 5. 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 6. Lower lumbar spondylosis and degenerative disc disease. 7. Atherosclerosis. 8. Small hypodense lesions of the lower poles of each kidney likely represent cysts but are technically nonspecific due to small size.   Electronically Signed   By: WSherryl Barters  On: 08/08/2013 13:49   Dg Chest Port 1 View  08/22/2013   CLINICAL DATA:  Shortness of breath.  EXAM: PORTABLE CHEST - 1 VIEW  COMPARISON:  Chest radiograph performed 08/14/2013  FINDINGS:  The lungs are well-aerated. Mild chronic vascular congestion is noted. There is no evidence of focal  opacification, pleural effusion or pneumothorax.  The cardiomediastinal silhouette is within normal limits. No acute osseous abnormalities are seen.  IMPRESSION: Mild chronic vascular congestion noted; lungs otherwise clear.   Electronically Signed   By: Garald Balding M.D.   On: 08/22/2013 21:31   Dg Knee Left Port  08/22/2013   CLINICAL DATA:  Postop tibial plateau fracture ORIF  EXAM: PORTABLE LEFT KNEE - 1-2 VIEW  COMPARISON:  08/22/2013 at 10/2019  FINDINGS: There is a comminuted tibial plateau fracture. There is a lateral compression plate, and fracture fragments demonstrate near anatomic position with no significant depression. There is also a nondisplaced oblique fracture involving the proximal fibula.  IMPRESSION: ORIF proximal tibial fracture.   Electronically Signed   By: Skipper Cliche M.D.   On: 08/22/2013 14:53   Dg C-arm 1-60 Min  08/22/2013   CLINICAL DATA:  Tibial plateau fracture  EXAM: LEFT KNEE - 1-2 VIEW; DG C-ARM 1-60 MIN  COMPARISON:  Multiple priors.  FINDINGS: C-arm films document open reduction internal fixation of a tibial plateau fracture. Lateral locking plate and screw device was used. Improved position and alignment.  IMPRESSION: As above.   Electronically Signed   By: Rolla Flatten M.D.   On: 08/22/2013 15:23    Disposition: 06-Home-Health Care Svc       Future Appointments Provider Department Dept Phone   09/07/2013 10:00 AM Hester Mates, MD [place] [phone]   09/08/2013 10:10 AM Liliane Shi, PA-C [place] [facility] [phone]      Follow-up Information   Follow up with Edmonia Lynch, D, MD In 2 weeks. (as scheduled)    Specialty:  Orthopedic Surgery   Contact information:   [address] [address] [phone]        Signed: Edmonia Lynch, D 08/24/2013, 12:00 PM

## 2013-08-24 NOTE — Progress Notes (Signed)
Physical Therapy Treatment Patient Details Name: Elizabeth [name] MRN: [id number] DOB: 1956/05/10 Today's Date: 08/24/2013 Time: 7445-1460 PT Time Calculation (min): 17 min  PT Assessment / Plan / Recommendation  History of Present Illness Pt is a 57 y/o female admitted for tibial plateau fracture after falling off her neighbor's porch. ORIF 08/22/13.    PT Comments   Pt agitated throughout session but willing to participate with encouragement.  Limited standing tolerance but good balance, requiring only min A and maintaining NWB status.  Pt will require 24 hour supervision at home and at this time manual assist for transfers.  Follow Up Recommendations  Supervision/Assistance - 24 hour;SNF     Does the patient have the potential to tolerate intense rehabilitation     Barriers to Discharge        Equipment Recommendations       Recommendations for Other Services    Frequency Min 5X/week   Progress towards PT Goals Progress towards PT goals: Progressing toward goals  Plan Current plan remains appropriate    Precautions / Restrictions Precautions Precautions: Fall;Knee Required Braces or Orthoses: Knee Immobilizer - Left Restrictions Weight Bearing Restrictions: Yes LLE Weight Bearing: Non weight bearing   Pertinent Vitals/Pain No c/o pain, c/o fatigue    Mobility  Bed Mobility Supine to Sit: 4: Min assist;HOB elevated;With rails Sit to Supine: 2: Max assist Details for Bed Mobility Assistance: Pt requires assist for B LEs into bed, L LE out of bed, cues for technique Transfers Sit to Stand: 4: Min assist Stand to Sit: 4: Min assist Details for Transfer Assistance: sit to stand multiple attempts with cues for hand placement, good adherence to NWB status.  Pt able to maintain standing 30-45 seconds before agitated due to fatigue    Exercises     PT Diagnosis:    PT Problem List:   PT Treatment Interventions:     PT Goals (current goals can now be found in the  care plan section)    Visit Information  Last PT Received On: 08/24/13 Assistance Needed: +1 History of Present Illness: Pt is a 57 y/o female admitted for tibial plateau fracture after falling off her neighbor's porch. ORIF 08/22/13.     Subjective Data      Cognition  Cognition Arousal/Alertness: Lethargic Behavior During Therapy: Agitated    Balance  Static Standing Balance Static Standing - Level of Assistance: 4: Min assist Static Standing - Comment/# of Minutes: Pt performed reaching activity standing with min A for 30-45 seconds before fatigued  End of Session PT - End of Session Equipment Utilized During Treatment: Gait belt Activity Tolerance: Patient limited by fatigue;Treatment limited secondary to agitation Patient left: in bed;with call bell/phone within reach   GP     [facility] 08/24/2013, 2:06 PM

## 2013-08-24 NOTE — Progress Notes (Signed)
08/23/13 1425  Noted home health orders.  Pt. is active with [facility] for [facility] PT/OT/RN.  Pt. to dc home today.  Llana Aliment, RN, BSN NCM [phone]

## 2013-08-24 NOTE — Progress Notes (Addendum)
Temperature of 100.1 will continue to monitor.

## 2013-08-25 NOTE — Consult Note (Signed)
Patient seen with and discussed with NP Arguello. I agree with the documentation above. Pulmonary edema secondary to severe hypertension posteroperatively. Responded well to IV lasix, patient currently without any symptoms. Continue aggressive blood pressure control overnight. No evidence of acute cardiac event at this point in time.    Carlyle Dolly MD

## 2013-08-27 NOTE — Clinical Social Work Placement (Addendum)
Clinical Social Work Department CLINICAL SOCIAL WORK PLACEMENT NOTE 08/27/2013  Patient:  KAIA, DEPAOLIS  Account Number:  [id number] Admit date:  08/22/2013  Clinical Social Worker:  Ky Barban, Latanya Presser  Date/time:  08/23/2013 07:22 PM  Clinical Social Work is seeking post-discharge placement for this patient at the following level of care:   SKILLED NURSING   (*CSW will update this form in Epic as items are completed)   08/23/2013  Patient/family provided with [place] Department of Clinical Social Work's list of facilities offering this level of care within the geographic area requested by the patient (or if unable, by the patient's family).  08/23/2013  Patient/family informed of their freedom to choose among providers that offer the needed level of care, that participate in Medicare, Medicaid or managed care program needed by the patient, have an available bed and are willing to accept the patient.  08/23/2013  Patient/family informed of [facility]' ownership interest in [facility], as well as of the fact that they are under no obligation to receive care at this facility.  PASARR submitted to EDS on 08/23/2013 PASARR number received from [employer] on 08/23/2013  FL2 transmitted to all facilities in geographic area requested by pt/family on  08/23/2013 FL2 transmitted to all facilities within larger geographic area on   Patient informed that his/her managed care company has contracts with or will negotiate with  certain facilities, including the following:     Patient/family informed of bed offers received:   Patient chooses bed at  Physician recommends and patient chooses bed at    Patient to be transferred to  on   Patient to be transferred to facility by   The following physician request were entered in Epic:   Additional Comments: 08/24/13  CSW met with patient. Ok per MD for d/c today. Patient declines SNF placement and wants to return home.   RNCM is aware.  No further CSW needs; signing off.  Lorie Phenix. [place], [place]

## 2013-08-28 DIAGNOSIS — M25569 Pain in unspecified knee: Secondary | ICD-10-CM | POA: Diagnosis not present

## 2013-08-28 DIAGNOSIS — I2510 Atherosclerotic heart disease of native coronary artery without angina pectoris: Secondary | ICD-10-CM | POA: Diagnosis not present

## 2013-08-28 DIAGNOSIS — S8290XD Unspecified fracture of unspecified lower leg, subsequent encounter for closed fracture with routine healing: Secondary | ICD-10-CM | POA: Diagnosis not present

## 2013-08-28 DIAGNOSIS — F329 Major depressive disorder, single episode, unspecified: Secondary | ICD-10-CM | POA: Diagnosis not present

## 2013-08-28 DIAGNOSIS — R269 Unspecified abnormalities of gait and mobility: Secondary | ICD-10-CM | POA: Diagnosis not present

## 2013-08-29 DIAGNOSIS — S8290XD Unspecified fracture of unspecified lower leg, subsequent encounter for closed fracture with routine healing: Secondary | ICD-10-CM | POA: Diagnosis not present

## 2013-08-29 DIAGNOSIS — F329 Major depressive disorder, single episode, unspecified: Secondary | ICD-10-CM | POA: Diagnosis not present

## 2013-08-29 DIAGNOSIS — M25569 Pain in unspecified knee: Secondary | ICD-10-CM | POA: Diagnosis not present

## 2013-08-29 DIAGNOSIS — R269 Unspecified abnormalities of gait and mobility: Secondary | ICD-10-CM | POA: Diagnosis not present

## 2013-08-29 DIAGNOSIS — I2510 Atherosclerotic heart disease of native coronary artery without angina pectoris: Secondary | ICD-10-CM | POA: Diagnosis not present

## 2013-08-31 ENCOUNTER — Encounter (HOSPITAL_COMMUNITY): Payer: Self-pay | Admitting: Orthopedic Surgery

## 2013-09-05 ENCOUNTER — Encounter (HOSPITAL_COMMUNITY): Payer: Self-pay | Admitting: Orthopedic Surgery

## 2013-09-06 DIAGNOSIS — S8290XD Unspecified fracture of unspecified lower leg, subsequent encounter for closed fracture with routine healing: Secondary | ICD-10-CM | POA: Diagnosis not present

## 2013-09-07 ENCOUNTER — Ambulatory Visit: Payer: Medicare Other | Admitting: Internal Medicine

## 2013-09-08 ENCOUNTER — Encounter: Payer: Self-pay | Admitting: Internal Medicine

## 2013-09-08 ENCOUNTER — Encounter: Payer: Medicare Other | Admitting: Physician Assistant

## 2013-09-08 NOTE — Progress Notes (Signed)
  This encounter was created in error - please disregard.

## 2013-09-12 DIAGNOSIS — R269 Unspecified abnormalities of gait and mobility: Secondary | ICD-10-CM | POA: Diagnosis not present

## 2013-09-12 DIAGNOSIS — M25569 Pain in unspecified knee: Secondary | ICD-10-CM | POA: Diagnosis not present

## 2013-09-12 DIAGNOSIS — I2510 Atherosclerotic heart disease of native coronary artery without angina pectoris: Secondary | ICD-10-CM | POA: Diagnosis not present

## 2013-09-12 DIAGNOSIS — F329 Major depressive disorder, single episode, unspecified: Secondary | ICD-10-CM | POA: Diagnosis not present

## 2013-09-12 DIAGNOSIS — S82109A Unspecified fracture of upper end of unspecified tibia, initial encounter for closed fracture: Secondary | ICD-10-CM | POA: Diagnosis not present

## 2013-09-12 DIAGNOSIS — S8290XD Unspecified fracture of unspecified lower leg, subsequent encounter for closed fracture with routine healing: Secondary | ICD-10-CM | POA: Diagnosis not present

## 2013-09-14 ENCOUNTER — Encounter: Payer: Self-pay | Admitting: Physician Assistant

## 2013-09-29 ENCOUNTER — Ambulatory Visit: Payer: Medicare Other | Admitting: Internal Medicine

## 2013-09-29 DIAGNOSIS — S8290XD Unspecified fracture of unspecified lower leg, subsequent encounter for closed fracture with routine healing: Secondary | ICD-10-CM | POA: Diagnosis not present

## 2013-10-03 DIAGNOSIS — M25569 Pain in unspecified knee: Secondary | ICD-10-CM | POA: Diagnosis not present

## 2013-10-03 DIAGNOSIS — S8290XD Unspecified fracture of unspecified lower leg, subsequent encounter for closed fracture with routine healing: Secondary | ICD-10-CM | POA: Diagnosis not present

## 2013-10-03 DIAGNOSIS — R269 Unspecified abnormalities of gait and mobility: Secondary | ICD-10-CM | POA: Diagnosis not present

## 2013-10-03 DIAGNOSIS — F329 Major depressive disorder, single episode, unspecified: Secondary | ICD-10-CM | POA: Diagnosis not present

## 2013-10-03 DIAGNOSIS — I2510 Atherosclerotic heart disease of native coronary artery without angina pectoris: Secondary | ICD-10-CM | POA: Diagnosis not present

## 2013-10-05 DIAGNOSIS — R269 Unspecified abnormalities of gait and mobility: Secondary | ICD-10-CM | POA: Diagnosis not present

## 2013-10-05 DIAGNOSIS — M25569 Pain in unspecified knee: Secondary | ICD-10-CM | POA: Diagnosis not present

## 2013-10-05 DIAGNOSIS — I2510 Atherosclerotic heart disease of native coronary artery without angina pectoris: Secondary | ICD-10-CM | POA: Diagnosis not present

## 2013-10-05 DIAGNOSIS — F329 Major depressive disorder, single episode, unspecified: Secondary | ICD-10-CM | POA: Diagnosis not present

## 2013-10-05 DIAGNOSIS — S8290XD Unspecified fracture of unspecified lower leg, subsequent encounter for closed fracture with routine healing: Secondary | ICD-10-CM | POA: Diagnosis not present

## 2013-10-09 ENCOUNTER — Other Ambulatory Visit: Payer: Self-pay | Admitting: Internal Medicine

## 2013-10-10 NOTE — Telephone Encounter (Signed)
I have not had a discussion with this patient about Ambien. I will continue to prescribe it for now.

## 2013-10-10 NOTE — Telephone Encounter (Signed)
Called to pharm 

## 2013-10-17 DIAGNOSIS — R269 Unspecified abnormalities of gait and mobility: Secondary | ICD-10-CM | POA: Diagnosis not present

## 2013-10-17 DIAGNOSIS — M25569 Pain in unspecified knee: Secondary | ICD-10-CM | POA: Diagnosis not present

## 2013-10-17 DIAGNOSIS — F329 Major depressive disorder, single episode, unspecified: Secondary | ICD-10-CM | POA: Diagnosis not present

## 2013-10-17 DIAGNOSIS — I2510 Atherosclerotic heart disease of native coronary artery without angina pectoris: Secondary | ICD-10-CM | POA: Diagnosis not present

## 2013-10-17 DIAGNOSIS — S8290XD Unspecified fracture of unspecified lower leg, subsequent encounter for closed fracture with routine healing: Secondary | ICD-10-CM | POA: Diagnosis not present

## 2013-10-17 NOTE — Telephone Encounter (Signed)
Rx called in to pharmacy. Hilda Blades Monya Kozakiewicz RN 10/17/13 8:30AM

## 2013-11-01 DIAGNOSIS — S8290XD Unspecified fracture of unspecified lower leg, subsequent encounter for closed fracture with routine healing: Secondary | ICD-10-CM | POA: Diagnosis not present

## 2013-11-02 ENCOUNTER — Other Ambulatory Visit (HOSPITAL_COMMUNITY): Payer: Self-pay | Admitting: Orthopedic Surgery

## 2013-11-02 ENCOUNTER — Ambulatory Visit (HOSPITAL_COMMUNITY): Admission: RE | Admit: 2013-11-02 | Payer: Medicare Other | Source: Ambulatory Visit

## 2013-11-02 DIAGNOSIS — M79609 Pain in unspecified limb: Secondary | ICD-10-CM

## 2013-11-03 ENCOUNTER — Ambulatory Visit: Payer: Medicare Other | Admitting: Internal Medicine

## 2013-11-08 ENCOUNTER — Encounter (HOSPITAL_COMMUNITY): Payer: Medicare Other

## 2013-11-17 ENCOUNTER — Ambulatory Visit (HOSPITAL_COMMUNITY)
Admission: RE | Admit: 2013-11-17 | Discharge: 2013-11-17 | Disposition: A | Discharge status: Home / Self Care | Payer: Medicare Other | Source: Ambulatory Visit | Attending: Cardiology | Admitting: Cardiology

## 2013-11-17 DIAGNOSIS — M7989 Other specified soft tissue disorders: Secondary | ICD-10-CM

## 2013-11-17 DIAGNOSIS — M79609 Pain in unspecified limb: Secondary | ICD-10-CM

## 2013-11-17 NOTE — Progress Notes (Signed)
Venous Duplex Lower Ext. Left Completed. Negative for DVT. Oda Cogan, BS, RDMS, RVT

## 2013-11-28 ENCOUNTER — Ambulatory Visit: Payer: Medicare Other | Admitting: Internal Medicine

## 2013-11-28 DIAGNOSIS — S82109A Unspecified fracture of upper end of unspecified tibia, initial encounter for closed fracture: Secondary | ICD-10-CM | POA: Diagnosis not present

## 2013-11-28 DIAGNOSIS — M6281 Muscle weakness (generalized): Secondary | ICD-10-CM | POA: Diagnosis not present

## 2013-11-28 DIAGNOSIS — M25569 Pain in unspecified knee: Secondary | ICD-10-CM | POA: Diagnosis not present

## 2013-12-01 ENCOUNTER — Ambulatory Visit: Payer: Medicare Other | Admitting: Internal Medicine

## 2013-12-01 DIAGNOSIS — M6281 Muscle weakness (generalized): Secondary | ICD-10-CM | POA: Diagnosis not present

## 2013-12-01 DIAGNOSIS — M25569 Pain in unspecified knee: Secondary | ICD-10-CM | POA: Diagnosis not present

## 2013-12-01 DIAGNOSIS — S82109A Unspecified fracture of upper end of unspecified tibia, initial encounter for closed fracture: Secondary | ICD-10-CM | POA: Diagnosis not present

## 2013-12-05 ENCOUNTER — Encounter: Payer: Self-pay | Admitting: Internal Medicine

## 2013-12-07 ENCOUNTER — Ambulatory Visit: Payer: Medicare Other | Admitting: Internal Medicine

## 2013-12-07 DIAGNOSIS — M6281 Muscle weakness (generalized): Secondary | ICD-10-CM | POA: Diagnosis not present

## 2013-12-07 DIAGNOSIS — M25569 Pain in unspecified knee: Secondary | ICD-10-CM | POA: Diagnosis not present

## 2013-12-07 DIAGNOSIS — S53106A Unspecified dislocation of unspecified ulnohumeral joint, initial encounter: Secondary | ICD-10-CM | POA: Diagnosis not present

## 2013-12-08 DIAGNOSIS — M25569 Pain in unspecified knee: Secondary | ICD-10-CM | POA: Diagnosis not present

## 2013-12-13 DIAGNOSIS — S53106A Unspecified dislocation of unspecified ulnohumeral joint, initial encounter: Secondary | ICD-10-CM | POA: Diagnosis not present

## 2013-12-13 DIAGNOSIS — M25569 Pain in unspecified knee: Secondary | ICD-10-CM | POA: Diagnosis not present

## 2013-12-13 DIAGNOSIS — M6281 Muscle weakness (generalized): Secondary | ICD-10-CM | POA: Diagnosis not present

## 2013-12-14 ENCOUNTER — Ambulatory Visit: Payer: Medicare Other | Admitting: Internal Medicine

## 2013-12-20 ENCOUNTER — Ambulatory Visit: Payer: Medicare Other | Admitting: Internal Medicine

## 2013-12-21 DIAGNOSIS — S53106A Unspecified dislocation of unspecified ulnohumeral joint, initial encounter: Secondary | ICD-10-CM | POA: Diagnosis not present

## 2013-12-21 DIAGNOSIS — M25569 Pain in unspecified knee: Secondary | ICD-10-CM | POA: Diagnosis not present

## 2013-12-21 DIAGNOSIS — M6281 Muscle weakness (generalized): Secondary | ICD-10-CM | POA: Diagnosis not present

## 2013-12-25 ENCOUNTER — Encounter: Payer: Self-pay | Admitting: Internal Medicine

## 2013-12-25 ENCOUNTER — Ambulatory Visit (INDEPENDENT_AMBULATORY_CARE_PROVIDER_SITE_OTHER): Payer: Medicare Other | Admitting: Internal Medicine

## 2013-12-25 VITALS — BP 166/106 | HR 72 | Temp 97.2°F | Wt 187.2 lb

## 2013-12-25 DIAGNOSIS — R52 Pain, unspecified: Secondary | ICD-10-CM | POA: Diagnosis not present

## 2013-12-25 DIAGNOSIS — S82109A Unspecified fracture of upper end of unspecified tibia, initial encounter for closed fracture: Secondary | ICD-10-CM

## 2013-12-25 DIAGNOSIS — I10 Essential (primary) hypertension: Secondary | ICD-10-CM | POA: Diagnosis not present

## 2013-12-25 DIAGNOSIS — J9600 Acute respiratory failure, unspecified whether with hypoxia or hypercapnia: Secondary | ICD-10-CM | POA: Diagnosis not present

## 2013-12-25 DIAGNOSIS — I2589 Other forms of chronic ischemic heart disease: Secondary | ICD-10-CM | POA: Diagnosis not present

## 2013-12-25 DIAGNOSIS — K219 Gastro-esophageal reflux disease without esophagitis: Secondary | ICD-10-CM

## 2013-12-25 DIAGNOSIS — R7309 Other abnormal glucose: Secondary | ICD-10-CM

## 2013-12-25 DIAGNOSIS — I509 Heart failure, unspecified: Secondary | ICD-10-CM | POA: Diagnosis not present

## 2013-12-25 DIAGNOSIS — N390 Urinary tract infection, site not specified: Secondary | ICD-10-CM | POA: Diagnosis not present

## 2013-12-25 DIAGNOSIS — Z91199 Patient's noncompliance with other medical treatment and regimen due to unspecified reason: Secondary | ICD-10-CM | POA: Diagnosis not present

## 2013-12-25 DIAGNOSIS — R7303 Prediabetes: Secondary | ICD-10-CM

## 2013-12-25 DIAGNOSIS — M899 Disorder of bone, unspecified: Secondary | ICD-10-CM | POA: Diagnosis not present

## 2013-12-25 DIAGNOSIS — Z9119 Patient's noncompliance with other medical treatment and regimen: Secondary | ICD-10-CM | POA: Diagnosis not present

## 2013-12-25 DIAGNOSIS — M949 Disorder of cartilage, unspecified: Secondary | ICD-10-CM | POA: Diagnosis not present

## 2013-12-25 DIAGNOSIS — M79609 Pain in unspecified limb: Secondary | ICD-10-CM | POA: Diagnosis not present

## 2013-12-25 DIAGNOSIS — I255 Ischemic cardiomyopathy: Secondary | ICD-10-CM

## 2013-12-25 DIAGNOSIS — J810 Acute pulmonary edema: Secondary | ICD-10-CM | POA: Diagnosis not present

## 2013-12-25 DIAGNOSIS — I5023 Acute on chronic systolic (congestive) heart failure: Secondary | ICD-10-CM | POA: Diagnosis not present

## 2013-12-25 DIAGNOSIS — E785 Hyperlipidemia, unspecified: Secondary | ICD-10-CM | POA: Diagnosis not present

## 2013-12-25 DIAGNOSIS — G4700 Insomnia, unspecified: Secondary | ICD-10-CM

## 2013-12-25 DIAGNOSIS — S82142A Displaced bicondylar fracture of left tibia, initial encounter for closed fracture: Secondary | ICD-10-CM

## 2013-12-25 LAB — POCT GLYCOSYLATED HEMOGLOBIN (HGB A1C): HEMOGLOBIN A1C: 5.5

## 2013-12-25 LAB — BASIC METABOLIC PANEL WITH GFR
BUN: 10 mg/dL (ref 6–23)
CHLORIDE: 104 meq/L (ref 96–112)
CO2: 29 mEq/L (ref 19–32)
Calcium: 9.7 mg/dL (ref 8.4–10.5)
Creat: 0.74 mg/dL (ref 0.50–1.10)
GFR, Est African American: >89 mL/min
GLUCOSE: 96 mg/dL (ref 70–99)
Potassium: 3.8 mEq/L (ref 3.5–5.3)
SODIUM: 143 meq/L (ref 135–145)

## 2013-12-25 LAB — LIPID PANEL
CHOL/HDL RATIO: 3.7 ratio
Cholesterol: 241 mg/dL — ABNORMAL HIGH (ref 0–200)
HDL: 66 mg/dL (ref >39)
LDL Cholesterol: 154 mg/dL — ABNORMAL HIGH (ref 0–99)
Triglycerides: 105 mg/dL (ref <150)
VLDL: 21 mg/dL (ref 0–40)

## 2013-12-25 LAB — GLUCOSE, CAPILLARY: Glucose-Capillary: 95 mg/dL (ref 70–99)

## 2013-12-25 MED ORDER — ROSUVASTATIN CALCIUM 40 MG PO TABS: 40.0000 mg | ORAL_TABLET | Freq: Every day | ORAL | Status: DC

## 2013-12-25 MED ORDER — OMEPRAZOLE MAGNESIUM 20 MG PO TBEC: 20.0000 mg | DELAYED_RELEASE_TABLET | Freq: Every day | ORAL | Status: DC

## 2013-12-25 MED ORDER — ASPIRIN 81 MG PO TABS: 81.0000 mg | ORAL_TABLET | Freq: Every day | ORAL | Status: DC

## 2013-12-25 MED ORDER — CARVEDILOL 6.25 MG PO TABS: 6.2500 mg | ORAL_TABLET | Freq: Two times a day (BID) | ORAL | Status: DC

## 2013-12-25 MED ORDER — FUROSEMIDE 20 MG PO TABS: 20.0000 mg | ORAL_TABLET | Freq: Every day | ORAL | Status: DC

## 2013-12-25 MED ORDER — LISINOPRIL 2.5 MG PO TABS: 2.5000 mg | ORAL_TABLET | Freq: Every day | ORAL | Status: DC

## 2013-12-25 MED ORDER — ZOLPIDEM TARTRATE 10 MG PO TABS: 10.0000 mg | ORAL_TABLET | Freq: Every evening | ORAL | Status: DC | PRN

## 2013-12-25 NOTE — Assessment & Plan Note (Signed)
Assessment: A1C was 6.2 in the past. She reports chronic blurry vision and states that she will see her ophthalmologist.  Plan -Recheck A1c today

## 2013-12-25 NOTE — Assessment & Plan Note (Signed)
Assessment: Recent left tibial plateau fracture in October 2014 after a mechanical fall.   Plan -Will obtain DEXA scan

## 2013-12-25 NOTE — Assessment & Plan Note (Signed)
Assessment: Patient is not compliant with her medical treatment or advices.

## 2013-12-25 NOTE — Assessment & Plan Note (Addendum)
BP Readings from Last 3 Encounters:  12/25/13 166/106  08/24/13 113/67  08/24/13 113/67    Lab Results  Component Value Date   Elizabeth [name] 137 08/23/2013   K 4.2 08/23/2013   CREATININE 0.74 08/23/2013    Assessment: Blood pressure control: moderately elevated Progress toward BP goal:  deteriorated Elevated blood pressure second medical noncompliance.  Denies associated symptoms.  Patient states, " I am feeling fine".  Plan: Medications:  continue current medications Educational resources provided:   Self management tools provided: home blood pressure logbook Other plans:  1.  The importance of medical compliance discussed with patient. 2.  The potential complications of uncontrolled hypertension including stroke, heart attack, heart failure, sudden cardiac death is discussed with patient. 3. patient instructed to resume all of her antihypertensive medications.  refills are sent to her pharmacy. 4. Will obtain FLP today.  5. followup bmp in 2-3 weeks

## 2013-12-25 NOTE — Patient Instructions (Signed)
1. Will give your refills for your medication. 2. Please follow up in 2 weeks 3. Will check your blood today.

## 2013-12-25 NOTE — Progress Notes (Signed)
Patient ID: Elizabeth [name], female   DOB: Apr 03, 1956, 58 y.o.   MRN: [id number]    Patient: Elizabeth [name]   MRN: [id number]  DOB: March 12, 1956  PCP: [name], MD   Subjective:    CC: Medication Refill   HPI: Elizabeth [name] is a 58 y.o. female with a PMHx of medical noncompliance, SLE, hypertension, hyperlipidemia, NSTEMI/CAD/ICM, substance abuse, depression, who presented to clinic today for the followup.  Patient was recently hospitalized for acute respiratory failure with hypoxia and surgical repair of left tibial plateau fracture on 08/22/2013.  She was discharged on stable condition with instructions of early ambulation, sequential compression devices, and aspirin 325 mg daily for DVT prophylaxis(she was noncompliant with Lovenox at home the previous week).  She was also restarted all her home medicines and start lisinopril 2.5 mg daily.  However, patient states that she never took aspirin 300 mg daily nor any of her home meds since hospital discharge.  As a matter of fact, she later admits to me that she has not been taking any of her pills for 4-5 months  Patient states that she's doing well since hospital discharge.  She has no complaints today.  She is here to get all her medication refills.  She cannot provide me the reasons why she can get her refills.  She said she did not call the clinic to request refills.    Review of Systems: Per HPI.   Current Outpatient Medications: Current Outpatient Prescriptions  Medication Sig Dispense Refill  . aspirin 81 MG tablet Take 1 tablet (81 mg total) by mouth daily.  30 tablet  11  . carvedilol (COREG) 6.25 MG tablet Take 1 tablet (6.25 mg total) by mouth 2 (two) times daily.  60 tablet  5  . diazepam (VALIUM) 2 MG tablet Take 1 tablet (2 mg total) by mouth every 6 (six) hours as needed for anxiety.  30 tablet  0  . furosemide (LASIX) 20 MG tablet Take 1 tablet (20 mg total) by mouth daily.  30 tablet  3  .  lisinopril (ZESTRIL) 2.5 MG tablet Take 1 tablet (2.5 mg total) by mouth daily.  30 tablet  0  . omeprazole (PRILOSEC OTC) 20 MG tablet Take 1 tablet (20 mg total) by mouth daily.  30 tablet  5  . oxyCODONE (ROXICODONE) 5 MG immediate release tablet Take 2 tablets (10 mg total) by mouth every 4 (four) hours as needed for pain.  90 tablet  0  . rosuvastatin (CRESTOR) 40 MG tablet Take 1 tablet (40 mg total) by mouth daily.  30 tablet  11  . zolpidem (AMBIEN) 10 MG tablet Take 1 tablet (10 mg total) by mouth at bedtime as needed for sleep.  30 tablet  0   No current facility-administered medications for this visit.    Allergies: No Known Allergies  Past Medical History  Diagnosis Date  . Hypertension   . HLD (hyperlipidemia)     Chol = 235, LDL = 156 (08/2010)  . Substance abuse     cocaine   . Lupus 2009    ANA + 10/2008, repeat ANA + (05/2009), on that visit 05/2009 following labs obtained RF <20, CRP <0.4, ANA titers 1:80,   . Pulmonary nodules 05/2008    noted on CXR and CT 05/2008, repeat CT 10/2008 - Stable small bilateral pulmonary nodules measuring up to 6 m m  . Insomnia   . GERD (gastroesophageal reflux disease)   . Fibroids   .  History of microcytic hypochromic anemia   . Fibromyalgia   . Myocardial infarction   . CAD (coronary artery disease)     NSTEMI 08/2011:  LHC 08/21/11: mLAD 60-70%, pCFX occluded, dRCA chronic occlusion with L-R collats, EF 40-45%, inf AK.  PCI:  BMS to CFX.  Marland Kitchen Depression   . Shortness of breath 08/22/2013    Objective:    Physical Exam: Filed Vitals:   12/25/13 1004  BP: 166/106  Pulse: 72  Temp: 97.2 F (36.2 C)  TempSrc: Oral  Weight: 187 lb 3.2 oz (84.913 kg)  SpO2: 99%     General: Vital signs reviewed and noted. Well-developed, well-nourished, in no acute distress; alert, appropriate and cooperative throughout examination.  Head: Normocephalic, atraumatic.  Lungs:  Normal respiratory effort. Clear to auscultation BL without  crackles or wheezes.  Heart: RRR. S1 and S2 normal without gallop, rubs, murmur.  Abdomen:  BS normoactive. Soft, Nondistended, non-tender.  No masses or organomegaly.  Extremities: No pretibial edema. Left lower leg well healed scar noted. She uses cane to ambulate.    Assessment/ Plan:

## 2013-12-25 NOTE — Assessment & Plan Note (Signed)
Assessment Patient had a left tibial plateau fracture which was operated on 08/22/2013.  She's discharged with the instructions to take aspirin 325 mg daily for DVT prophylaxis.  However, she did not take any of her medications.  She states that she's doing fine and work with PT/OT.   Plan: - It is four-month postop now. Will change the ASA back to her previous home dose of 81 mg daily. - Continue current PT/OT per her surgeon.

## 2013-12-26 NOTE — Progress Notes (Signed)
Case discussed with Dr. Nicoletta Dress at the time of the visit.  We reviewed the resident's history and exam and pertinent patient test results.  I agree with the assessment, diagnosis, and plan of care documented in the resident's note.

## 2014-01-01 ENCOUNTER — Ambulatory Visit: Payer: Medicare Other

## 2014-01-11 ENCOUNTER — Ambulatory Visit: Payer: Medicare Other | Admitting: Internal Medicine

## 2014-01-19 ENCOUNTER — Ambulatory Visit: Payer: Medicare Other

## 2014-01-19 ENCOUNTER — Other Ambulatory Visit: Payer: Medicare Other

## 2014-02-09 ENCOUNTER — Ambulatory Visit: Payer: Medicare Other | Admitting: Internal Medicine

## 2014-02-28 ENCOUNTER — Encounter: Payer: Self-pay | Admitting: Internal Medicine

## 2014-03-05 ENCOUNTER — Ambulatory Visit: Payer: Medicare Other | Admitting: Internal Medicine

## 2014-03-12 ENCOUNTER — Ambulatory Visit: Payer: Medicare Other | Admitting: Internal Medicine

## 2014-03-16 ENCOUNTER — Ambulatory Visit: Payer: Medicare Other | Admitting: Internal Medicine

## 2014-03-19 ENCOUNTER — Other Ambulatory Visit: Payer: Self-pay | Admitting: Internal Medicine

## 2014-04-16 DIAGNOSIS — I639 Cerebral infarction, unspecified: Secondary | ICD-10-CM

## 2014-04-16 HISTORY — DX: Cerebral infarction, unspecified: I63.9

## 2014-04-30 ENCOUNTER — Ambulatory Visit: Payer: Medicare Other | Admitting: Internal Medicine

## 2014-04-30 ENCOUNTER — Encounter: Payer: Self-pay | Admitting: Internal Medicine

## 2014-05-09 ENCOUNTER — Ambulatory Visit (INDEPENDENT_AMBULATORY_CARE_PROVIDER_SITE_OTHER): Payer: Medicare Other | Admitting: Internal Medicine

## 2014-05-09 ENCOUNTER — Other Ambulatory Visit: Payer: Self-pay

## 2014-05-09 ENCOUNTER — Encounter: Payer: Self-pay | Admitting: Internal Medicine

## 2014-05-09 ENCOUNTER — Observation Stay (HOSPITAL_COMMUNITY)
Admission: AD | Admit: 2014-05-09 | Discharge: 2014-05-10 | Disposition: A | Discharge status: Home / Self Care | Payer: Medicare Other | Source: Ambulatory Visit | Attending: Internal Medicine | Admitting: Internal Medicine

## 2014-05-09 ENCOUNTER — Observation Stay (HOSPITAL_COMMUNITY): Payer: Medicare Other

## 2014-05-09 ENCOUNTER — Encounter (HOSPITAL_COMMUNITY): Payer: Self-pay | Admitting: *Deleted

## 2014-05-09 VITALS — BP 158/101 | HR 69 | Temp 98.0°F | Ht 62.0 in | Wt 192.1 lb

## 2014-05-09 DIAGNOSIS — M329 Systemic lupus erythematosus, unspecified: Secondary | ICD-10-CM | POA: Diagnosis not present

## 2014-05-09 DIAGNOSIS — I059 Rheumatic mitral valve disease, unspecified: Secondary | ICD-10-CM | POA: Diagnosis not present

## 2014-05-09 DIAGNOSIS — G459 Transient cerebral ischemic attack, unspecified: Secondary | ICD-10-CM | POA: Insufficient documentation

## 2014-05-09 DIAGNOSIS — R079 Chest pain, unspecified: Secondary | ICD-10-CM

## 2014-05-09 DIAGNOSIS — I2589 Other forms of chronic ischemic heart disease: Secondary | ICD-10-CM

## 2014-05-09 DIAGNOSIS — Z9889 Other specified postprocedural states: Secondary | ICD-10-CM | POA: Diagnosis not present

## 2014-05-09 DIAGNOSIS — I2510 Atherosclerotic heart disease of native coronary artery without angina pectoris: Secondary | ICD-10-CM | POA: Diagnosis not present

## 2014-05-09 DIAGNOSIS — R002 Palpitations: Secondary | ICD-10-CM | POA: Diagnosis not present

## 2014-05-09 DIAGNOSIS — F1020 Alcohol dependence, uncomplicated: Secondary | ICD-10-CM | POA: Diagnosis present

## 2014-05-09 DIAGNOSIS — R29810 Facial weakness: Secondary | ICD-10-CM | POA: Insufficient documentation

## 2014-05-09 DIAGNOSIS — F3289 Other specified depressive episodes: Secondary | ICD-10-CM | POA: Diagnosis not present

## 2014-05-09 DIAGNOSIS — I10 Essential (primary) hypertension: Secondary | ICD-10-CM | POA: Insufficient documentation

## 2014-05-09 DIAGNOSIS — R918 Other nonspecific abnormal finding of lung field: Secondary | ICD-10-CM | POA: Diagnosis not present

## 2014-05-09 DIAGNOSIS — R29818 Other symptoms and signs involving the nervous system: Secondary | ICD-10-CM | POA: Diagnosis not present

## 2014-05-09 DIAGNOSIS — Z9861 Coronary angioplasty status: Secondary | ICD-10-CM

## 2014-05-09 DIAGNOSIS — K219 Gastro-esophageal reflux disease without esophagitis: Secondary | ICD-10-CM | POA: Diagnosis not present

## 2014-05-09 DIAGNOSIS — G4700 Insomnia, unspecified: Secondary | ICD-10-CM | POA: Insufficient documentation

## 2014-05-09 DIAGNOSIS — E785 Hyperlipidemia, unspecified: Secondary | ICD-10-CM | POA: Diagnosis not present

## 2014-05-09 DIAGNOSIS — F329 Major depressive disorder, single episode, unspecified: Secondary | ICD-10-CM | POA: Diagnosis not present

## 2014-05-09 DIAGNOSIS — Z8742 Personal history of other diseases of the female genital tract: Secondary | ICD-10-CM | POA: Diagnosis not present

## 2014-05-09 DIAGNOSIS — R0789 Other chest pain: Secondary | ICD-10-CM | POA: Diagnosis not present

## 2014-05-09 DIAGNOSIS — D509 Iron deficiency anemia, unspecified: Secondary | ICD-10-CM | POA: Diagnosis not present

## 2014-05-09 DIAGNOSIS — I252 Old myocardial infarction: Secondary | ICD-10-CM | POA: Insufficient documentation

## 2014-05-09 DIAGNOSIS — R209 Unspecified disturbances of skin sensation: Secondary | ICD-10-CM | POA: Insufficient documentation

## 2014-05-09 DIAGNOSIS — R768 Other specified abnormal immunological findings in serum: Secondary | ICD-10-CM | POA: Diagnosis present

## 2014-05-09 DIAGNOSIS — I5033 Acute on chronic diastolic (congestive) heart failure: Secondary | ICD-10-CM | POA: Diagnosis present

## 2014-05-09 DIAGNOSIS — I255 Ischemic cardiomyopathy: Secondary | ICD-10-CM | POA: Diagnosis present

## 2014-05-09 DIAGNOSIS — F17200 Nicotine dependence, unspecified, uncomplicated: Secondary | ICD-10-CM | POA: Insufficient documentation

## 2014-05-09 DIAGNOSIS — G458 Other transient cerebral ischemic attacks and related syndromes: Secondary | ICD-10-CM

## 2014-05-09 LAB — MAGNESIUM: Magnesium: 2 mg/dL (ref 1.5–2.5)

## 2014-05-09 LAB — CREATININE, SERUM
Creatinine, Ser: 0.89 mg/dL (ref 0.50–1.10)
GFR calc Af Amer: 82 mL/min — ABNORMAL LOW (ref >90)
GFR calc non Af Amer: 71 mL/min — ABNORMAL LOW (ref >90)

## 2014-05-09 LAB — CBC
HCT: 40.2 % (ref 36.0–46.0)
HEMOGLOBIN: 12.6 g/dL (ref 12.0–15.0)
MCH: 27.8 pg (ref 26.0–34.0)
MCHC: 31.3 g/dL (ref 30.0–36.0)
MCV: 88.7 fL (ref 78.0–100.0)
Platelets: 225 10*3/uL (ref 150–400)
RBC: 4.53 MIL/uL (ref 3.87–5.11)
RDW: 15.1 % (ref 11.5–15.5)
WBC: 6.2 10*3/uL (ref 4.0–10.5)

## 2014-05-09 LAB — MRSA PCR SCREENING: MRSA by PCR: NEGATIVE

## 2014-05-09 LAB — HEMOGLOBIN A1C
Hgb A1c MFr Bld: 5.9 % — ABNORMAL HIGH (ref <5.7)
Mean Plasma Glucose: 123 mg/dL — ABNORMAL HIGH (ref <117)

## 2014-05-09 LAB — BASIC METABOLIC PANEL
BUN: 16 mg/dL (ref 6–23)
CHLORIDE: 100 meq/L (ref 96–112)
CO2: 24 meq/L (ref 19–32)
Calcium: 9.3 mg/dL (ref 8.4–10.5)
Creatinine, Ser: 0.99 mg/dL (ref 0.50–1.10)
GFR calc non Af Amer: 63 mL/min — ABNORMAL LOW (ref >90)
GFR, EST AFRICAN AMERICAN: 72 mL/min — AB (ref >90)
Glucose, Bld: 89 mg/dL (ref 70–99)
Potassium: 3.7 mEq/L (ref 3.7–5.3)
Sodium: 139 mEq/L (ref 137–147)

## 2014-05-09 LAB — RAPID URINE DRUG SCREEN, HOSP PERFORMED
Amphetamines: NOT DETECTED
BARBITURATES: NOT DETECTED
Benzodiazepines: NOT DETECTED
COCAINE: NOT DETECTED
OPIATES: NOT DETECTED
Tetrahydrocannabinol: NOT DETECTED

## 2014-05-09 LAB — TROPONIN I: Troponin I: <0.3 ng/mL (ref <0.30)

## 2014-05-09 LAB — GLUCOSE, CAPILLARY
Glucose-Capillary: 91 mg/dL (ref 70–99)
Glucose-Capillary: 101 mg/dL — ABNORMAL HIGH (ref 70–99)

## 2014-05-09 MED ORDER — ATORVASTATIN CALCIUM 80 MG PO TABS
80.0000 mg | ORAL_TABLET | Freq: Every day | ORAL | Status: DC
  Administered 2014-05-09 – 2014-05-10 (×2): 80 mg via ORAL
  Filled 2014-05-09 (×2): qty 1

## 2014-05-09 MED ORDER — NITROGLYCERIN 0.4 MG SL SUBL
SUBLINGUAL_TABLET | SUBLINGUAL | Status: DC
Start: 2014-05-09 — End: 2014-05-09
  Filled 2014-05-09: qty 1

## 2014-05-09 MED ORDER — LISINOPRIL 2.5 MG PO TABS
2.5000 mg | ORAL_TABLET | Freq: Every day | ORAL | Status: DC
  Administered 2014-05-09 – 2014-05-10 (×2): 2.5 mg via ORAL
  Filled 2014-05-09 (×2): qty 1

## 2014-05-09 MED ORDER — ZOLPIDEM TARTRATE 5 MG PO TABS
5.0000 mg | ORAL_TABLET | Freq: Every evening | ORAL | Status: DC | PRN
  Administered 2014-05-09: 5 mg via ORAL
  Filled 2014-05-09: qty 1

## 2014-05-09 MED ORDER — PNEUMOCOCCAL VAC POLYVALENT 25 MCG/0.5ML IJ INJ
0.5000 mL | INJECTION | INTRAMUSCULAR | Status: DC
  Filled 2014-05-09: qty 0.5

## 2014-05-09 MED ORDER — ENOXAPARIN SODIUM 40 MG/0.4ML ~~LOC~~ SOLN
40.0000 mg | SUBCUTANEOUS | Status: DC
  Filled 2014-05-09 (×2): qty 0.4

## 2014-05-09 MED ORDER — SODIUM CHLORIDE 0.9 % IV SOLN: 250.0000 mL | INTRAVENOUS | Status: DC | PRN

## 2014-05-09 MED ORDER — CARVEDILOL 6.25 MG PO TABS
6.2500 mg | ORAL_TABLET | Freq: Two times a day (BID) | ORAL | Status: DC
  Administered 2014-05-09 – 2014-05-10 (×3): 6.25 mg via ORAL
  Filled 2014-05-09 (×4): qty 1

## 2014-05-09 MED ORDER — SODIUM CHLORIDE 0.9 % IJ SOLN
3.0000 mL | Freq: Two times a day (BID) | INTRAMUSCULAR | Status: DC
  Administered 2014-05-09 – 2014-05-10 (×2): 3 mL via INTRAVENOUS

## 2014-05-09 MED ORDER — ASPIRIN 81 MG PO CHEW
81.0000 mg | CHEWABLE_TABLET | Freq: Once | ORAL | Status: AC
  Administered 2014-05-09: 81 mg via ORAL
  Filled 2014-05-09: qty 1

## 2014-05-09 MED ORDER — SODIUM CHLORIDE 0.9 % IJ SOLN: 3.0000 mL | INTRAMUSCULAR | Status: DC | PRN

## 2014-05-09 MED ORDER — DIAZEPAM 2 MG PO TABS
2.0000 mg | ORAL_TABLET | Freq: Four times a day (QID) | ORAL | Status: DC | PRN
  Administered 2014-05-09: 2 mg via ORAL
  Filled 2014-05-09: qty 1

## 2014-05-09 MED ORDER — ASPIRIN EC 81 MG PO TBEC
81.0000 mg | DELAYED_RELEASE_TABLET | Freq: Every day | ORAL | Status: DC
  Administered 2014-05-09 – 2014-05-10 (×2): 81 mg via ORAL
  Filled 2014-05-09 (×2): qty 1

## 2014-05-09 MED ORDER — PANTOPRAZOLE SODIUM 20 MG PO TBEC
20.0000 mg | DELAYED_RELEASE_TABLET | Freq: Every day | ORAL | Status: DC
  Administered 2014-05-09 – 2014-05-10 (×2): 20 mg via ORAL
  Filled 2014-05-09 (×2): qty 1

## 2014-05-09 MED ORDER — FUROSEMIDE 20 MG PO TABS
20.0000 mg | ORAL_TABLET | Freq: Every day | ORAL | Status: DC
  Administered 2014-05-09 – 2014-05-10 (×2): 20 mg via ORAL
  Filled 2014-05-09 (×2): qty 1

## 2014-05-09 MED ORDER — GI COCKTAIL ~~LOC~~
30.0000 mL | Freq: Once | ORAL | Status: AC
  Administered 2014-05-09: 30 mL via ORAL
  Filled 2014-05-09: qty 30

## 2014-05-09 MED ORDER — ACETAMINOPHEN 325 MG PO TABS
650.0000 mg | ORAL_TABLET | Freq: Four times a day (QID) | ORAL | Status: DC | PRN
  Administered 2014-05-09 – 2014-05-10 (×2): 650 mg via ORAL
  Filled 2014-05-09 (×2): qty 2

## 2014-05-09 NOTE — Assessment & Plan Note (Signed)
The patient describes seems to be more in line with TIA in the setting of possible diagnosis of lupus, history of MI, an inability to have access to her medications recently, not taking secondary prevention of aspirin,and smoking likely lead to some sort of vascular pathology. It is unclear whether a stroke did have been or whether these were both representations of TIAs that have resolved. The patient is well outside the TPA window and therefore was directly admitted for workup.

## 2014-05-09 NOTE — Progress Notes (Signed)
Night Float Interim Progress Note  Paged by RN regarding patient c/o chest pain.  No dyspnea, VSS except BP 170/44mHg.  Went to see patient.  She was on the telephone but hung up to speak with me.  She says she does not feel well overall but cannot pinpoint exactly what is wrong.  She says the chest pain is new and started around dinner time.  She reported feeling hungry, no N/V prior to onset.  She ate her dinner.  The chest pain has continued since then, comes and goes, but she cannot tell me the duration.  She denies radiation to neck/arms/back.  She is not dyspneic.  She also notes epigastric discomfort but denies N/V.  She reports diarrhea for the past two days which has resolved today.  She has a frontal headache which is not new.  She denies visual changes or facial numbness.  She declined NTG because she is concerned it will worsen her headache.  Recent vitals:  T 98.87F, RR 18, HR 66, BP 153/964mg, SpO2 99% on RA, NSR 80s on telemetry General:  Anxious but calms with reassurance; she is not diaphoretic Heart:  RRR, no m/r/g; right chest is TTP, central and left chest are non-tender Lungs:  CTA B/L no w/r/r Abdomen:  +BS, soft, ND, epigastric and RUQ TTP, no guarding/rebound/rigidity LE:  No edema, non-tender  Neuro:  She is able to move all extremities independently, denies numbness of her face or extremities, except she does not right index finger feels numb (she says this happens from time to time) EKG (during chest pain):  New TWI since 2014, no ST elevation/depression  [age]ear old woman with PMH of CAD (NSTEMI 2012), grade 1 dd, HTN, GERD, fibromyalgia admitted for work-up of suspected TIA.  Now reporting intermittent, sharp chest pain at rest for the past few hours.  Her description is atypical but given her risk factors and gender will need to r/o ACS.  She has received daily ASA earlier today.  She declines NTG due to fear of headache.  Possible GI etiology since symptoms started with  around meal time and she has associated epigastric pain and RUQ tenderness on exam.  She takes a medication for GERD at home and has received protonix this evening.  Possibly MSK pain related to fibromyalgia as she has left chest tenderness on exam. - continue telemetry monitoring - trend cardiac enzymes - ASA now and continue daily - continue Coreg, lisinopril, Lasix, statin, Protonix (evening doses already given) - GI cocktail x 1 - consider abdominal [place] assess GB in the AM - continue prn Tylenol for headache - continue prn Valium for anxiety - close monitoring by RN and notification of any change in patient status - day team to reassess in the AM  [email or web address] Night Float Intern

## 2014-05-09 NOTE — Progress Notes (Signed)
Echocardiogram 2D Echocardiogram has been performed.  Elizabeth [name] 05/09/2014, 3:40 PM

## 2014-05-09 NOTE — H&P (Signed)
Date: 05/09/2014               Patient Name:  Elizabeth [name] MRN: [id number]  DOB: 1956-07-07 Age / Sex: 58 y.o., female   PCP: [name], MD         Medical Service: Internal Medicine Teaching Service         Attending Physician: Dr. Sid Falcon, MD    First Contact: Dr. Ronnald Ramp Pager: [phone]  Second Contact: Dr. Alice Rieger Pager: [phone]       After Hours (After 5p/  First Contact Pager: [phone]  weekends / holidays): Second Contact Pager: [phone]   Chief Complaint: Facial numbness  History of Present Illness: [name] is a 58 y.o. female w/ PMHx of HTN, HLD, SLE, GERD, CAD (NSTEMI 992426), dCHF, Depression, and Fibromyalgia, presented to the clinic this AM w/ reports of left-sided facial numbness over the weekend. The patient claims she woke up on Saturday morning (05/05/14) w/ left-sided facial numbness involving the whole side of her face, mouth, and tongue. She also states that she thought her eye felt strange and she looked in the mirror and noticed it looked "droopy". She claims these symptoms lasted about 45 minutes and then resolved. Again on Monday she described a similar numbness, mostly involving her tongue. She has not had these symptoms since that time and says she has never experienced this before. She denies any sensory disturbances in her extremities and also denies any weakness, confusion, LOC, h/o seizures, dizziness, or lightheadedness. The patient does describe some infrequent palpitations and mild intermittent left-sided chest pain. She denies a h/o CVA in the past. She claims to have a family history of heart disease, but no neurological issues. She is not aware of a h/o clotting disorders, however, patient has been shown to be ANA + and has been told she has SLE in the past. The patient has a long history of smoking (>30 years), but says she only smokes about 3 cigarettes daily. She also admits to drinking at least one 40 oz beer daily and  describes a h/o cocaine abuse but claims she has not used any recreational drugs in at least 7 months. The patient also takes multiple medications for HTN, but says she has been out of them for the past 1 month.    Meds: Current Facility-Administered Medications  Medication Dose Route Frequency Tien Aispuro Last Rate Last Dose  . 0.9 %  sodium chloride infusion  250 mL Intravenous PRN Jeralene Huff, MD      . aspirin EC tablet 81 mg  81 mg Oral Daily Jeralene Huff, MD   81 mg at 05/09/14 1252  . atorvastatin (LIPITOR) tablet 80 mg  80 mg Oral q1800 Jeralene Huff, MD   80 mg at 05/09/14 1253  . carvedilol (COREG) tablet 6.25 mg  6.25 mg Oral BID Jeralene Huff, MD   6.25 mg at 05/09/14 1252  . diazepam (VALIUM) tablet 2 mg  2 mg Oral Q6H PRN Jeralene Huff, MD      . enoxaparin (LOVENOX) injection 40 mg  40 mg Subcutaneous Q24H Jeralene Huff, MD      . furosemide (LASIX) tablet 20 mg  20 mg Oral Daily Jeralene Huff, MD   20 mg at 05/09/14 1252  . lisinopril (PRINIVIL,ZESTRIL) tablet 2.5 mg  2.5 mg Oral Daily Jeralene Huff, MD   2.5 mg at 05/09/14 1253  . pantoprazole (PROTONIX) EC tablet 20 mg  20 mg Oral Daily Jeralene Huff, MD   20 mg at 05/09/14 1252  . [START ON 05/10/2014] pneumococcal 23 valent vaccine (PNU-IMMUNE) injection 0.5 mL  0.5 mL Intramuscular Tomorrow-1000 Sid Falcon, MD      . sodium chloride 0.9 % injection 3 mL  3 mL Intravenous Q12H Jeralene Huff, MD      . sodium chloride 0.9 % injection 3 mL  3 mL Intravenous PRN Jeralene Huff, MD      . zolpidem (AMBIEN) tablet 5 mg  5 mg Oral QHS PRN Jeralene Huff, MD       Medications Prior to Admission  Medication Sig Dispense Refill  . aspirin 81 MG tablet Take 1 tablet (81 mg total) by mouth daily.  30 tablet  11  . carvedilol (COREG) 6.25 MG tablet Take 1 tablet (6.25 mg total) by mouth 2 (two) times daily.  60  tablet  5  . diazepam (VALIUM) 2 MG tablet Take 1 tablet (2 mg total) by mouth every 6 (six) hours as needed for anxiety.  30 tablet  0  . furosemide (LASIX) 20 MG tablet Take 1 tablet (20 mg total) by mouth daily.  30 tablet  3  . lisinopril (PRINIVIL,ZESTRIL) 2.5 MG tablet Take 2.5 mg by mouth daily.      . rosuvastatin (CRESTOR) 40 MG tablet Take 1 tablet (40 mg total) by mouth daily.  30 tablet  11    Allergies: Allergies as of 05/09/2014  . (No Known Allergies)   Past Medical History  Diagnosis Date  . Hypertension   . HLD (hyperlipidemia)     Chol = 235, LDL = 156 (08/2010)  . Substance abuse     cocaine   . Lupus 2009    ANA + 10/2008, repeat ANA + (05/2009), on that visit 05/2009 following labs obtained RF <20, CRP <0.4, ANA titers 1:80,   . Pulmonary nodules 05/2008    noted on CXR and CT 05/2008, repeat CT 10/2008 - Stable small bilateral pulmonary nodules measuring up to 6 m m  . Insomnia   . GERD (gastroesophageal reflux disease)   . Fibroids   . History of microcytic hypochromic anemia   . Fibromyalgia   . Myocardial infarction   . CAD (coronary artery disease)     NSTEMI 08/2011:  LHC 08/21/11: mLAD 60-70%, pCFX occluded, dRCA chronic occlusion with L-R collats, EF 40-45%, inf AK.  PCI:  BMS to CFX.  Marland Kitchen Depression   . Shortness of breath 08/22/2013   Past Surgical History  Procedure Laterality Date  . Abdominal hysterectomy  05/2003  . Colonoscopy N/A 07/14/2013    Procedure: COLONOSCOPY;  Surgeon: Beryle Beams, MD;  Location: [facility];  Service: Endoscopy;  Laterality: N/A;  . Stent on chest    . Cardiac catheterization    . Orif tibia plateau Left 08/22/2013    Procedure: OPEN REDUCTION INTERNAL FIXATION (ORIF) TIBIAL PLATEAU;  Surgeon: Renette Butters, MD;  Location: [place];  Service: Orthopedics;  Laterality: Left;   Family History  Problem Relation Age of Onset  . Heart disease Mother   . Cervical cancer Sister   . Prostate cancer Brother   . Colon  cancer Neg Hx    History   Social History  . Marital Status: Divorced    Spouse Name: N/A    Number of Children: N/A  . Years of Education: N/A   Occupational History  . Not on file.  Social History Main Topics  . Smoking status: Current Some Day Smoker -- 0.30 packs/day    Types: Cigarettes  . Smokeless tobacco: Not on file     Comment: 3 cigs per day- given QUIT line info  . Alcohol Use: 3.5 oz/week    7 drink(s) per week     Comment: beer 1-2 per day  . Drug Use: No     Comment: last used cocaine less than a month ago  . Sexual Activity: Not Currently   Other Topics Concern  . Not on file   Social History Narrative  . No narrative on file    Review of Systems: General: Denies fever, chills, diaphoresis, appetite change and fatigue.  Respiratory: Denies SOB, DOE, cough, chest tightness, and wheezing.   Cardiovascular: Positive for intermittent chest pain and palpitations. Gastrointestinal: Denies nausea, vomiting, abdominal pain, diarrhea, constipation, blood in stool and abdominal distention.  Genitourinary: Denies dysuria, urgency, frequency, hematuria, and flank pain. Endocrine: Denies hot or cold intolerance, polyuria, and polydipsia. Musculoskeletal: Denies myalgias, back pain, joint swelling, arthralgias and gait problem.  Skin: Denies pallor, rash and wounds.  Neurological: Positive for left-sided facial numbness. Denies dizziness, seizures, syncope, weakness, lightheadedness, and headaches.  Psychiatric/Behavioral: Positive for sleep disturbance. Denies mood changes, confusion, nervousness, and agitation.   Physical Exam: Filed Vitals:   05/09/14 1032 05/09/14 1338  BP: 148/81 118/56  Pulse: 73 66  Temp: 98.1 F (36.7 C) 97.6 F (36.4 C)  TempSrc: Oral Oral  Resp: 18 18  Height: _0  (1.575 m)   Weight: 192 lb 4.8 oz (87.227 kg)   SpO2: 97% 98%   General: Vital signs reviewed.  Patient is a well-developed and well-nourished, in no acute distress  and cooperative with exam.  Head: Normocephalic and atraumatic. Eyes: PERRL, EOMI, conjunctivae normal, No scleral icterus.  Neck: Supple, trachea midline, normal ROM, No JVD, masses, thyromegaly, or carotid bruit present.  Cardiovascular: RRR, S1 normal, S2 normal, no murmurs, gallops, or rubs. Pulmonary/Chest: Air entry equal bilaterally, no wheezes, rales, or rhonchi. Abdominal: Soft, non-tender, non-distended, BS +, no masses, organomegaly, or guarding present.  Musculoskeletal: No joint deformities, erythema, or stiffness, ROM full and nontender. Extremities: No swelling or edema,  pulses symmetric and intact bilaterally. No cyanosis or clubbing. Neurological: A&O x3, Strength is normal and symmetric bilaterally, cranial nerve II-XII are grossly intact, no focal motor deficit, sensory intact to light touch bilaterally.  Skin: Warm, dry and intact. No rashes or erythema. Psychiatric: Normal mood and affect. Speech and behavior is normal. Cognition and memory are normal.    Lab results: Basic Metabolic Panel:  Recent Labs  05/09/14 1347  CREATININE 0.89   CBC:  Recent Labs  05/09/14 1347  WBC 6.2  HGB 12.6  HCT 40.2  MCV 88.7  PLT 225   CBG:  Recent Labs  05/09/14 1640  GLUCAP 91   Urine Drug Screen: Drugs of Abuse     Component Value Date/Time   LABOPIA NONE DETECTED 05/09/2014 1644   COCAINSCRNUR NONE DETECTED 05/09/2014 1644   LABBENZ NONE DETECTED 05/09/2014 1644   AMPHETMU NONE DETECTED 05/09/2014 1644   THCU NONE DETECTED 05/09/2014 1644   LABBARB NONE DETECTED 05/09/2014 1644     Anti-dsDNA: pending  ANA: pending  Imaging results:  Ct Head Without Contrast  05/09/2014   CLINICAL DATA:  Left-sided facial numbness for 5 days.  EXAM: CT HEAD WITHOUT CONTRAST  TECHNIQUE: Contiguous axial images were obtained from the base of the skull through  the vertex without intravenous contrast.  COMPARISON:  None.  FINDINGS: I No mass lesion. No midline shift. No acute  hemorrhage or hematoma. No extra-axial fluid collections. No evidence of acute infarction. There is an old right inferior medial right occipital lobe infarct with secondary encephalomalacia.  Brain parenchyma is otherwise normal.  There is complete opacification of the left maxillary sinus with slight mucosal thickening in the anterior left ethmoid air cells. There is inspissated mucus in the sinus. Paranasal sinuses are otherwise clear.  IMPRESSION: 1. No acute intracranial abnormality. 2. 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 3. 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   Electronically Signed   By: Rozetta Nunnery M.D.   On: 05/09/2014 14:29    Other results: EKG: pending  Assessment & Plan by Problem: [name] is a 58 y.o. female w/ PMHx of HTN, HLD, SLE, GERD, CAD (NSTEMI 094709), dCHF (grade 2, 08/23/2013), Depression, and Fibromyalgia, admitted for suspected TIA.  Left-sided Facial Numbness- Patient w/ left-sided facial numbness and possible facial droop on 05/05/14. Patient claims this lasted for ~45 minutes and then subsided, only to return 2 days later for a short time. No other sensory or motor abnormalities. Patient describes a long h/o smoking, alcohol abuse, and remote h/o cocaine abuse. No family h/o CVA or clotting disorders. Patient has been without her blood pressure medications for 1 month, says she ran out. Ms. Lawanna Kobus describes a h/o NSTEMI in 08/2011 2/2 cocaine abuse for which she received a BMS to the proximal left circumflex, started on ASA + Plavix at that time, however, Plavix was d/c'ed on 10/29/2011. She does not describe a h/o PAF or arrhythmia. However, given patient h/o smoking, HLD, CAD, alcohol abuse, and cocaine use, most likely TIA given short-lived nature of symptoms. No significant findings on exam, no neurological deficits. Additionally patient shown to be ANA positive w/ SLE (titer 1:80), therefore at greater risk of  thromboembolic events. -Admit to telemetry -CT head w/o contrast -2D ECHO -CA dopplers -EKG pending -Restart home BP meds; Lisinopril 2.5 mg qd, Coreg 6.25 mg bid, Lasix 20 mg qd; symptoms >48 hours ago, no need to allow for permissive HTN.  -Lipitor 80 mg qhs -ASA 81 mg  -Lipid panel, HbA1c -UDS pending -RN stroke swallow screen  SLE- Patient previously told she has Lupus, found to be ANA positive (1:80) on 06/11/2009. Patient does not show significant clinical manifestations, however, she does have hair loss/alopecia, joint pain, intermittent chest pain and dyspnea. Patient needs further workup w/ rheumatologist. Of note, patient can be at increased risk of thromboembolic events in the setting of SLE. Not currently on any medications for this.  -TIA workup as above -Repeat ANA + dsDNA pending -Further workup and management as an outpatient  CAD- As discussed above, NSTEMI in 2012 2/2 cocaine abuse, now s/p BMS to proximal LCx. Patient w/ mild intermittent chest pain, but claims this has not happened in some time. Takes ASA at home, in addition to Lipitor, Coreg, and Lisinopril at home. Patient denies recent cocaine abuse, but says she thinks her last time was 7 months ago.  -Continue ASA, Lipitor, Coreg, Lisinopril -EKG as above  Chronic dCHF- Previous ECHO from 08/23/2013 shows EF of 60-65% w/ grade 2 diastolic dysfunction. Takes Lasix 20 mg qd at home. No SOB, LE edema, or other signs of volume overload.  -Continue Lasix -ECHO as above  HTN- BP 158/101 in clinic this AM. 148/81 on admission. Takes Lisinopril 2.5 mg  qd, Coreg 6.25 mg bid, and Lasix 20 mg qd at home, however, she has been out of her meds for the last month.  -Restarted home meds as above  HLD- Patient takes Crestor at home. Lipid panel from 12/2013 shows total cholesterol 241, triglycerides 105, HDL 66, LDL 54.  -Lipitor 80 mg qhs  Depression/Anxiety- Stable  -Continue Valium 2 mg q6h prn -Ambien 5 mg qhs  prn  DVT/PE PPx- Lovenox Jeff  Dispo: Disposition is deferred at this time, awaiting improvement of current medical problems. Anticipated discharge in approximately 1-2 day(s).   The patient does have a current PCP ([name], MD) and does need an [facility] hospital follow-up appointment after discharge.  The patient does not have transportation limitations that hinder transportation to clinic appointments.  Signed: Corky Sox, MD 05/09/2014, 6:10 PM

## 2014-05-09 NOTE — Assessment & Plan Note (Signed)
BP Readings from Last 3 Encounters:  05/09/14 148/81  05/09/14 158/101  12/25/13 166/106   Pt had some elevated reading that was repeated this is in the setting of not having any of her medications.

## 2014-05-09 NOTE — Progress Notes (Signed)
Subjective:    Patient ID: Elizabeth [name], female    DOB: 18-May-1956, 58 y.o.   MRN: [id number]  HPI Ms. Dillard is a 58 yo woman pmh as listed below presents acutely for head pressure and sinus pain.   Pt starts describing her symptoms of onset on Saturday that consisted of complete numbness of the left side of her face inside of her mouth, and left-sided her tongue. She said that this episode lasted about the whole day Sunday and then spontaneously resolved. At that time she was also having trouble keeping her mouth closed and her neighbor noticed that she did have an asymmetrical smile. She did not have any associated fever or chills, recent tick exposure, tinnitus, otalgia, or recent URI infection. She then had a repeat episode on Monday morning that consisted of the same location of numbness and loss of tongue control on the left side that lasted and resolved spontaneously about an hour. She did not notice any other systemic numbness or weakness in any other part of her body during both of these episodes, no chest pain, no dyspnea on exertion, no headache, no shortness of breath, no nausea or vomiting. The head pressure and sinus pain that she describes today is associated with moving her head forward and that was happening around the time of these events but spontaneously resolves she has not had any rhinorrhea, PND, itchy watery eyes.  She does state that she was out of all of her medications for the past month including her aspirin and therefore has not been taking it. This has been a problem given that she cannot drive and her sister who brings her medicine was recently sick and unable to provide it to her this month. She does also continue to smoke at least 3 cigarettes every other day and has been consistently during this time as well. She states that she's had a history of lupus and MI in the past but does not have a rheumatologist or taking any medication at this time.  Past Medical  History  Diagnosis Date  . Hypertension   . HLD (hyperlipidemia)     Chol = 235, LDL = 156 (08/2010)  . Substance abuse     cocaine   . Lupus 2009    ANA + 10/2008, repeat ANA + (05/2009), on that visit 05/2009 following labs obtained RF <20, CRP <0.4, ANA titers 1:80,   . Pulmonary nodules 05/2008    noted on CXR and CT 05/2008, repeat CT 10/2008 - Stable small bilateral pulmonary nodules measuring up to 6 m m  . Insomnia   . GERD (gastroesophageal reflux disease)   . Fibroids   . History of microcytic hypochromic anemia   . Fibromyalgia   . Myocardial infarction   . CAD (coronary artery disease)     NSTEMI 08/2011:  LHC 08/21/11: mLAD 60-70%, pCFX occluded, dRCA chronic occlusion with L-R collats, EF 40-45%, inf AK.  PCI:  BMS to CFX.  Marland Kitchen Depression   . Shortness of breath 08/22/2013   Current Outpatient Prescriptions on File Prior to Visit  Medication Sig Dispense Refill  . aspirin 81 MG tablet Take 1 tablet (81 mg total) by mouth daily.  30 tablet  11  . carvedilol (COREG) 6.25 MG tablet Take 1 tablet (6.25 mg total) by mouth 2 (two) times daily.  60 tablet  5  . diazepam (VALIUM) 2 MG tablet Take 1 tablet (2 mg total) by mouth every 6 (six) hours as needed  for anxiety.  30 tablet  0  . furosemide (LASIX) 20 MG tablet Take 1 tablet (20 mg total) by mouth daily.  30 tablet  3  . lisinopril (PRINIVIL,ZESTRIL) 2.5 MG tablet TAKE 1 TABLET BY MOUTH EVERY DAY  30 tablet  6  . omeprazole (PRILOSEC OTC) 20 MG tablet Take 1 tablet (20 mg total) by mouth daily.  30 tablet  5  . oxyCODONE (ROXICODONE) 5 MG immediate release tablet Take 2 tablets (10 mg total) by mouth every 4 (four) hours as needed for pain.  90 tablet  0  . rosuvastatin (CRESTOR) 40 MG tablet Take 1 tablet (40 mg total) by mouth daily.  30 tablet  11  . zolpidem (AMBIEN) 10 MG tablet Take 1 tablet (10 mg total) by mouth at bedtime as needed for sleep.  30 tablet  0   No current facility-administered medications on file prior  to visit.   Social, surgical, family history reviewed with patient and updated in appropriate chart locations.   Review of Systems  Negative except As listed in history of present illness     Objective:   Physical Exam Filed Vitals:   05/09/14 0825  BP: 158/101  Pulse: 69  Temp: 98 F (36.7 C)   General: sitting in chair, NAD HEENT: PERRL, EOMI, no scleral icterus or injection Cardiac: RRR, no rubs, murmurs or gallops Pulm: clear to auscultation bilaterally,no crackles, wheezes, or rhonchi, moving normal volumes of air Abd: soft, nontender, nondistended, BS present Ext: warm and well perfused, no pedal edema Neuro: alert and oriented X3, cranial nerves II-XII intact, normal sensation bilaterally, symmetrical smile, normal hearing, 5 out of 5 bilateral hand grip strength, 5 out of 5 upper extremity strength,      Assessment & Plan:  Please see problem oriented charting  Pt discussed with Dr. Lynnae January

## 2014-05-09 NOTE — Assessment & Plan Note (Signed)
This maybe needed to be worked up and pt needs to see rheumatologist. Most recent labs just include a + ANA but no other rheum workup. Pt does have alopecia.

## 2014-05-10 DIAGNOSIS — I10 Essential (primary) hypertension: Secondary | ICD-10-CM

## 2014-05-10 DIAGNOSIS — M329 Systemic lupus erythematosus, unspecified: Secondary | ICD-10-CM

## 2014-05-10 DIAGNOSIS — E785 Hyperlipidemia, unspecified: Secondary | ICD-10-CM | POA: Diagnosis not present

## 2014-05-10 DIAGNOSIS — F341 Dysthymic disorder: Secondary | ICD-10-CM

## 2014-05-10 DIAGNOSIS — G459 Transient cerebral ischemic attack, unspecified: Secondary | ICD-10-CM

## 2014-05-10 DIAGNOSIS — R0789 Other chest pain: Secondary | ICD-10-CM | POA: Diagnosis not present

## 2014-05-10 DIAGNOSIS — I5032 Chronic diastolic (congestive) heart failure: Secondary | ICD-10-CM

## 2014-05-10 DIAGNOSIS — I509 Heart failure, unspecified: Secondary | ICD-10-CM

## 2014-05-10 DIAGNOSIS — I2510 Atherosclerotic heart disease of native coronary artery without angina pectoris: Secondary | ICD-10-CM

## 2014-05-10 LAB — EXTRACTABLE NUCLEAR ANTIGEN ANTIBODY
ENA SM Ab Ser-aCnc: <1
SCLERODERMA (SCL-70) (ENA) ANTIBODY, IGG: NEGATIVE
SSA (RO) (ENA) ANTIBODY, IGG: POSITIVE
SSB (La) (ENA) Antibody, IgG: <1
Sm/rnp: <1

## 2014-05-10 LAB — URINALYSIS, ROUTINE W REFLEX MICROSCOPIC
BILIRUBIN URINE: NEGATIVE
Glucose, UA: NEGATIVE mg/dL
Hgb urine dipstick: NEGATIVE
Ketones, ur: NEGATIVE mg/dL
Leukocytes, UA: NEGATIVE
Nitrite: NEGATIVE
PROTEIN: NEGATIVE mg/dL
Specific Gravity, Urine: 1.013 (ref 1.005–1.030)
UROBILINOGEN UA: 0.2 mg/dL (ref 0.0–1.0)
pH: 8.5 — ABNORMAL HIGH (ref 5.0–8.0)

## 2014-05-10 LAB — LIPID PANEL
CHOL/HDL RATIO: 4 ratio
Cholesterol: 192 mg/dL (ref 0–200)
HDL: 48 mg/dL (ref >39)
LDL CALC: 127 mg/dL — AB (ref 0–99)
TRIGLYCERIDES: 84 mg/dL (ref <150)
VLDL: 17 mg/dL (ref 0–40)

## 2014-05-10 LAB — ANTI-DNA ANTIBODY, DOUBLE-STRANDED: ds DNA Ab: <1 IU/mL

## 2014-05-10 LAB — TROPONIN I: Troponin I: <0.3 ng/mL (ref <0.30)

## 2014-05-10 LAB — GLUCOSE, CAPILLARY: Glucose-Capillary: 108 mg/dL — ABNORMAL HIGH (ref 70–99)

## 2014-05-10 MED ORDER — FUROSEMIDE 20 MG PO TABS: 20.0000 mg | ORAL_TABLET | Freq: Every day | ORAL | Status: DC

## 2014-05-10 MED ORDER — ATORVASTATIN CALCIUM 80 MG PO TABS: 80.0000 mg | ORAL_TABLET | Freq: Every day | ORAL | Status: DC

## 2014-05-10 MED ORDER — ZOLPIDEM TARTRATE 5 MG PO TABS: 5.0000 mg | ORAL_TABLET | Freq: Every evening | ORAL | Status: DC | PRN

## 2014-05-10 MED ORDER — OXYCODONE HCL 5 MG PO TABS
5.0000 mg | ORAL_TABLET | Freq: Once | ORAL | Status: AC
  Administered 2014-05-10: 5 mg via ORAL
  Filled 2014-05-10: qty 1

## 2014-05-10 MED ORDER — PANTOPRAZOLE SODIUM 40 MG PO TBEC: 40.0000 mg | DELAYED_RELEASE_TABLET | Freq: Every day | ORAL | Status: DC

## 2014-05-10 MED ORDER — LISINOPRIL 2.5 MG PO TABS: 2.5000 mg | ORAL_TABLET | Freq: Every day | ORAL | Status: DC

## 2014-05-10 MED ORDER — CARVEDILOL 6.25 MG PO TABS: 6.2500 mg | ORAL_TABLET | Freq: Two times a day (BID) | ORAL | Status: DC

## 2014-05-10 NOTE — Progress Notes (Signed)
Utilization review completed

## 2014-05-10 NOTE — Progress Notes (Signed)
Pt discharged to home per MD order. Pt received and reviewed all discharge instructions and medication information including follow-up appointments and prescription information. Pt verbalized understanding. Pt alert and oriented at discharge with no complaints of pain. Pt IV and telemetry box removed prior to discharge. Pt escorted to private vehicle via wheelchair by nurse tech. Lenna Sciara

## 2014-05-10 NOTE — Discharge Summary (Signed)
Name: Elizabeth [name] MRN: [id number] DOB: 19-Nov-1955 58 y.o. PCP: [name], MD  Date of Admission: 05/09/2014 10:19 AM Date of Discharge: 05/10/2014 Attending Physician: Sid Falcon, MD  Discharge Diagnosis: 1. TIA 2. ?SLE 3. CAD  Discharge Medications:   Medication List    STOP taking these medications       omeprazole 20 MG tablet  Commonly known as:  PRILOSEC OTC  Replaced by:  pantoprazole 40 MG tablet     rosuvastatin 40 MG tablet  Commonly known as:  CRESTOR  Replaced by:  atorvastatin 80 MG tablet      TAKE these medications       aspirin 81 MG tablet  Take 1 tablet (81 mg total) by mouth daily.     atorvastatin 80 MG tablet  Commonly known as:  LIPITOR  Take 1 tablet (80 mg total) by mouth daily at 6 PM.     carvedilol 6.25 MG tablet  Commonly known as:  COREG  Take 1 tablet (6.25 mg total) by mouth 2 (two) times daily.     diazepam 2 MG tablet  Commonly known as:  VALIUM  Take 1 tablet (2 mg total) by mouth every 6 (six) hours as needed for anxiety.     furosemide 20 MG tablet  Commonly known as:  LASIX  Take 1 tablet (20 mg total) by mouth daily.     lisinopril 2.5 MG tablet  Commonly known as:  PRINIVIL,ZESTRIL  Take 1 tablet (2.5 mg total) by mouth daily.     pantoprazole 40 MG tablet  Commonly known as:  PROTONIX  Take 1 tablet (40 mg total) by mouth daily.     zolpidem 5 MG tablet  Commonly known as:  AMBIEN  Take 1 tablet (5 mg total) by mouth at bedtime as needed for sleep.        Disposition and follow-up:   Elizabeth [name] was discharged from [facility] in Good condition.  At the hospital follow up visit please address:  1.  TIA; Patient admitted w/ left-sided facial numbness. Symptoms resolved prior to admission. All diagnostic testing found to be negative. Has patient had further symptoms? Weakness? Numbness? Facial droop?  SLE; Has been ANA +ve in the past. Ordered autoimmune panel +  ds-DNA, all negative EXCEPT for SSA (Ro) Ab; 7.2 POSITIVE. ANA however, not part of panel (thought it was). May want to reorder this to determine if patient is still ANA positive. No acute issues during admission, however, important in determining if patient is at a greater risk of thromboembolic events.   CAD: patient w/ previous h/o NSTEMI in the setting of cocaine abuse, has BMS to proximal LCx. Claims she has not used cocaine in several months. Patient should be scheduled for a cardiology follow up.   2.  Labs / imaging needed at time of follow-up: ANA  3.  Pending labs/ test needing follow-up: Follow up autoimmune panel (SSA, SSB, Scl-70, SM/RNP)  Follow-up Appointments:     Follow-up Information   Follow up with Duwaine Maxin, DO On 05/21/2014. (2:45 PM)    Specialty:  Internal Medicine   Contact information:   [place]  [id number] [phone]      Consultations:  none  Procedures Performed:  Ct Head Without Contrast  05/09/2014   CLINICAL DATA:  Left-sided facial numbness for 5 days.  EXAM: CT HEAD WITHOUT CONTRAST  TECHNIQUE: Contiguous axial images were obtained from the base of  the skull through the vertex without intravenous contrast.  COMPARISON:  None.  FINDINGS: I No mass lesion. No midline shift. No acute hemorrhage or hematoma. No extra-axial fluid collections. No evidence of acute infarction. There is an old right inferior medial right occipital lobe infarct with secondary encephalomalacia.  Brain parenchyma is otherwise normal.  There is complete opacification of the left maxillary sinus with slight mucosal thickening in the anterior left ethmoid air cells. There is inspissated mucus in the sinus. Paranasal sinuses are otherwise clear.  IMPRESSION: 1. No acute intracranial abnormality. 2. 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 3. 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   Electronically Signed   By: Rozetta Nunnery M.D.   On:  05/09/2014 14:29    2D Echo: 05/09/14  Study Conclusions:  - Left ventricle: The cavity size was normal. There was severe focal basal septal hypertrophy and moderate concentric hypertrophy of the left ventricle. Systolic function was normal. The estimated ejection fraction was in the range of 55% to 60%. There may be mild basal to mid inferior hypokinesis. Doppler parameters are consistent with abnormal left ventricular relaxation (grade 1 diastolic dysfunction). The E/e &'ratio is between 8-15, suggesting indeterminate LV filling pressure. - Aorta: Ascending aortic diameter: 41 mm (S). - Ascending aorta: The ascending aorta was mildly dilated. - Mitral valve: Mildly thickened leaflets . There was mild regurgitation. - Left atrium: Moderately dilated (42 ml/m2). - Tricuspid valve: There was mild regurgitation. - Pulmonary arteries: PA peak pressure: 34 mm Hg (S). - Inferior vena cava: The vessel was normal in size. The respirophasic diameter changes were in the normal range (>= 50%), consistent with normal central venous pressure.  Impressions:  - Compared to the last study in 2014, the EF is similar if slightly less and LV filling pressure is less.   Admission HPI: Elizabeth [name] is a 58 y.o. female w/ PMHx of HTN, HLD, SLE, GERD, CAD (NSTEMI [phone]), dCHF, Depression, and Fibromyalgia, presented to the clinic this AM w/ reports of left-sided facial numbness over the weekend. The patient claims she woke up on Saturday morning (05/05/14) w/ left-sided facial numbness involving the whole side of her face, mouth, and tongue. She also states that she thought her eye felt strange and she looked in the mirror and noticed it looked "droopy". She claims these symptoms lasted about 45 minutes and then resolved. Again on Monday she described a similar numbness, mostly involving her tongue. She has not had these symptoms since that time and says she has never experienced this before.  She denies any sensory disturbances in her extremities and also denies any weakness, confusion, LOC, h/o seizures, dizziness, or lightheadedness. The patient does describe some infrequent palpitations and mild intermittent left-sided chest pain. She denies a h/o CVA in the past. She claims to have a family history of heart disease, but no neurological issues. She is not aware of a h/o clotting disorders, however, patient has been shown to be ANA + and has been told she has SLE in the past. The patient has a long history of smoking (>30 years), but says she only smokes about 3 cigarettes daily. She also admits to drinking at least one 40 oz beer daily and describes a h/o cocaine abuse but claims she has not used any recreational drugs in at least 7 months. The patient also takes multiple medications for HTN, but says she has been out of them for the past 1 month.    Hospital  Course by problem list:  1. TIA- Patient w/ left-sided facial numbness and possible facial droop on 05/05/14. No other sensory or motor abnormalities. Patient describes a long h/o smoking, alcohol abuse, and remote h/o cocaine abuse. No family h/o CVA or clotting disorders. Patient had been without her BP medications for 1 month, says she ran out. Elizabeth [name] describes a h/o NSTEMI in 08/2011 2/2 cocaine abuse for which she received a BMS to the proximal left circumflex, started on ASA + Plavix at that time, however, Plavix was d/c'ed on 10/29/2011. No h/o PAF or arrhythmia. No significant findings on exam, no neurological deficits. Patient shown to be ANA positive w/ ?SLE (titer 1:80) in 2010, therefore at greater risk of thromboembolic events. Continued patient's home BP meds (outside window of permissive HTN); Lisinopril 2.5 mg qd, Coreg 6.25 mg bid, and Lasix 20 mg qd. ECHO performed, showed EF of 55-60% w/ grade 1 diastolic dysfunction. CA dopplers w/ 1-39% stenosis bilaterally, antegrade flow. HbA1c 5.9, LDL 127, otherwise lipid  panel wnl. UDS found to be negative. Started Lipitor 80 mg qhs, continued ASA. No further neurological issues.   2. ?SLE- Patient previously told she has Lupus, found to be ANA positive (1:80) on 06/11/2009. Does admit to joint pain, malar rash, and symptoms suggestive of Reynaud's. 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 ANA not part of autoimmune panel, should be repeated at clinic visit.    3. CAD- NSTEMI in 2012 2/2 cocaine abuse, s/p BMS to proximal LCx. LHC in 2012 also showed mid-LAD stenosis and occlusion of the distal RCA. Chest pain overnight on 05/09/14, no EKG changes, troponins negative x3. Most likely related to GI symptoms rather than cardiac chest pain. Continued ASA, Lipitor, Coreg, and Lisinopril as above. Patient will need cardiology referral and follow up as an outpatient.    Discharge Vitals:   BP 129/78  Pulse 76  Temp(Src) 99 F (37.2 C) (Oral)  Resp 18  Ht _0  (1.575 m)  Wt 193 lb 6.4 oz (87.726 kg)  BMI 35.36 kg/m2  SpO2 97%  LMP 12/30/1994  Discharge Labs:  Results for orders placed during the hospital encounter of 05/09/14 (from the past 24 hour(s))  MRSA PCR SCREENING     Status: None   Collection Time    05/09/14 12:07 PM      Result Value Ref Range   MRSA by PCR NEGATIVE  NEGATIVE  HEMOGLOBIN A1C     Status: Abnormal   Collection Time    05/09/14  1:47 PM      Result Value Ref Range   Hemoglobin A1C 5.9 (*) <5.7 %   Mean Plasma Glucose 123 (*) <117 mg/dL  CBC     Status: None   Collection Time    05/09/14  1:47 PM      Result Value Ref Range   WBC 6.2  4.0 - 10.5 K/uL   RBC 4.53  3.87 - 5.11 MIL/uL   Hemoglobin 12.6  12.0 - 15.0 g/dL   HCT 40.2  36.0 - 46.0 %   MCV 88.7  78.0 - 100.0 fL   MCH 27.8  26.0 - 34.0 pg   MCHC 31.3  30.0 - 36.0 g/dL   RDW 15.1  11.5 - 15.5 %   Platelets 225  150 - 400 K/uL  CREATININE, SERUM     Status: Abnormal   Collection Time     05/09/14  1:47 PM  Result Value Ref Range   Creatinine, Ser 0.89  0.50 - 1.10 mg/dL   GFR calc non Af Amer 71 (*) >90 mL/min   GFR calc Af Amer 82 (*) >90 mL/min  GLUCOSE, CAPILLARY     Status: None   Collection Time    05/09/14  4:40 PM      Result Value Ref Range   Glucose-Capillary 91  70 - 99 mg/dL  URINE RAPID DRUG SCREEN (HOSP PERFORMED)     Status: None   Collection Time    05/09/14  4:44 PM      Result Value Ref Range   Opiates NONE DETECTED  NONE DETECTED   Cocaine NONE DETECTED  NONE DETECTED   Benzodiazepines NONE DETECTED  NONE DETECTED   Amphetamines NONE DETECTED  NONE DETECTED   Tetrahydrocannabinol NONE DETECTED  NONE DETECTED   Barbiturates NONE DETECTED  NONE DETECTED  GLUCOSE, CAPILLARY     Status: Abnormal   Collection Time    05/09/14  8:35 PM      Result Value Ref Range   Glucose-Capillary 101 (*) 70 - 99 mg/dL   Comment 1 Notify RN    TROPONIN I     Status: None   Collection Time    05/09/14  8:45 PM      Result Value Ref Range   Troponin I <0.30  <0.30 ng/mL  BASIC METABOLIC PANEL     Status: Abnormal   Collection Time    05/09/14  8:45 PM      Result Value Ref Range   Sodium 139  137 - 147 mEq/L   Potassium 3.7  3.7 - 5.3 mEq/L   Chloride 100  96 - 112 mEq/L   CO2 24  19 - 32 mEq/L   Glucose, Bld 89  70 - 99 mg/dL   BUN 16  6 - 23 mg/dL   Creatinine, Ser 0.99  0.50 - 1.10 mg/dL   Calcium 9.3  8.4 - 10.5 mg/dL   GFR calc non Af Amer 63 (*) >90 mL/min   GFR calc Af Amer 72 (*) >90 mL/min  MAGNESIUM     Status: None   Collection Time    05/09/14  8:45 PM      Result Value Ref Range   Magnesium 2.0  1.5 - 2.5 mg/dL  LIPID PANEL     Status: Abnormal   Collection Time    05/10/14  1:35 AM      Result Value Ref Range   Cholesterol 192  0 - 200 mg/dL   Triglycerides 84  <150 mg/dL   HDL 48  >39 mg/dL   Total CHOL/HDL Ratio 4.0     VLDL 17  0 - 40 mg/dL   LDL Cholesterol 127 (*) 0 - 99 mg/dL  TROPONIN I     Status: None   Collection  Time    05/10/14  1:35 AM      Result Value Ref Range   Troponin I <0.30  <0.30 ng/mL  URINALYSIS, ROUTINE W REFLEX MICROSCOPIC     Status: Abnormal   Collection Time    05/10/14  4:49 AM      Result Value Ref Range   Color, Urine YELLOW  YELLOW   APPearance CLEAR  CLEAR   Specific Gravity, Urine 1.013  1.005 - 1.030   pH 8.5 (*) 5.0 - 8.0   Glucose, UA NEGATIVE  NEGATIVE mg/dL   Hgb urine dipstick NEGATIVE  NEGATIVE   Bilirubin Urine NEGATIVE  NEGATIVE   Ketones, ur NEGATIVE  NEGATIVE mg/dL   Protein, ur NEGATIVE  NEGATIVE mg/dL   Urobilinogen, UA 0.2  0.0 - 1.0 mg/dL   Nitrite NEGATIVE  NEGATIVE   Leukocytes, UA NEGATIVE  NEGATIVE  GLUCOSE, CAPILLARY     Status: Abnormal   Collection Time    05/10/14  7:21 AM      Result Value Ref Range   Glucose-Capillary 108 (*) 70 - 99 mg/dL  TROPONIN I     Status: None   Collection Time    05/10/14  7:25 AM      Result Value Ref Range   Troponin I <0.30  <0.30 ng/mL    Signed: Corky Sox, MD 05/10/2014, 11:29 AM    Services Ordered on Discharge: none Equipment Ordered on Discharge: none

## 2014-05-10 NOTE — Progress Notes (Signed)
*  PRELIMINARY RESULTS* Vascular Ultrasound Carotid Duplex (Doppler) has been completed.  Findings suggest 1-39% internal carotid artery stenosis bilaterally. Vertebral arteries are patent with antegrade flow.  05/10/2014 9:34 AM Maudry Mayhew, RVT, RDCS, RDMS

## 2014-05-10 NOTE — Progress Notes (Signed)
Subjective: Patient seen at bedside this AM. Had issues w/ chest pain overnight, no significant EKG changes, troponins negative x2. Complaining of a headache this AM. No further facial numbness, weakness, dizziness, lightheadedness, or palpitations.   Objective: Vital signs in last 24 hours: Filed Vitals:   05/10/14 0300 05/10/14 0540 05/10/14 0806 05/10/14 1051  BP: 151/74  115/59 129/78  Pulse: 88  76   Temp: 101.5 F (38.6 C) 98.9 F (37.2 C) 99 F (37.2 C)   TempSrc: Oral Oral Oral   Resp: 18  18   Height:      Weight: 193 lb 6.4 oz (87.726 kg)     SpO2: 100%  97%    Weight change:   Intake/Output Summary (Last 24 hours) at 05/10/14 1104 Last data filed at 05/10/14 1052  Gross per 24 hour  Intake    606 ml  Output    200 ml  Net    406 ml   Physical Exam: General: Alert, cooperative, NAD. HEENT: PERRL, EOMI. Moist mucus membranes Neck: Full range of motion without pain, supple, no lymphadenopathy or carotid bruits Lungs: Clear to ascultation bilaterally, normal work of respiration, no wheezes, rales, rhonchi Heart: RRR, no murmurs, gallops, or rubs Abdomen: Soft, non-tender, non-distended, BS + Extremities: No cyanosis, clubbing, or edema Neurologic: Alert & oriented X3, cranial nerves II-XII intact, strength grossly intact, sensation intact to light touch  Lab Results: Basic Metabolic Panel:  Recent Labs Lab 05/09/14 1347 05/09/14 2045  NA  --  139  K  --  3.7  CL  --  100  CO2  --  24  GLUCOSE  --  89  BUN  --  16  CREATININE 0.89 0.99  CALCIUM  --  9.3  MG  --  2.0   CBC:  Recent Labs Lab 05/09/14 1347  WBC 6.2  HGB 12.6  HCT 40.2  MCV 88.7  PLT 225   Cardiac Enzymes:  Recent Labs Lab 05/09/14 2045 05/10/14 0135 05/10/14 0725  TROPONINI <0.30 <0.30 <0.30   CBG:  Recent Labs Lab 05/09/14 1640 05/09/14 2035 05/10/14 0721  GLUCAP 91 101* 108*   Hemoglobin A1C:  Recent Labs Lab 05/09/14 1347  HGBA1C 5.9*   Fasting Lipid  Panel:  Recent Labs Lab 05/10/14 0135  CHOL 192  HDL 48  LDLCALC 127*  TRIG 84  CHOLHDL 4.0   Urine Drug Screen: Drugs of Abuse     Component Value Date/Time   LABOPIA NONE DETECTED 05/09/2014 1644   COCAINSCRNUR NONE DETECTED 05/09/2014 1644   LABBENZ NONE DETECTED 05/09/2014 1644   AMPHETMU NONE DETECTED 05/09/2014 1644   THCU NONE DETECTED 05/09/2014 1644   LABBARB NONE DETECTED 05/09/2014 1644    Urinalysis:  Recent Labs Lab 05/10/14 0449  COLORURINE YELLOW  LABSPEC 1.013  PHURINE 8.5*  GLUCOSEU NEGATIVE  HGBUR NEGATIVE  BILIRUBINUR NEGATIVE  KETONESUR NEGATIVE  PROTEINUR NEGATIVE  UROBILINOGEN 0.2  NITRITE NEGATIVE  LEUKOCYTESUR NEGATIVE    Studies/Results: Ct Head Without Contrast  05/09/2014   CLINICAL DATA:  Left-sided facial numbness for 5 days.  EXAM: CT HEAD WITHOUT CONTRAST  TECHNIQUE: Contiguous axial images were obtained from the base of the skull through the vertex without intravenous contrast.  COMPARISON:  None.  FINDINGS: I No mass lesion. No midline shift. No acute hemorrhage or hematoma. No extra-axial fluid collections. No evidence of acute infarction. There is an old right inferior medial right occipital lobe infarct with secondary encephalomalacia.  Brain parenchyma is otherwise  normal.  There is complete opacification of the left maxillary sinus with slight mucosal thickening in the anterior left ethmoid air cells. There is inspissated mucus in the sinus. Paranasal sinuses are otherwise clear.  IMPRESSION: 1. No acute intracranial abnormality. 2. 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 3. 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   Electronically Signed   By: Rozetta Nunnery M.D.   On: 05/09/2014 14:29   Medications: I have reviewed the patient's current medications. Scheduled Meds: . aspirin EC  81 mg Oral Daily  . atorvastatin  80 mg Oral q1800  . carvedilol  6.25 mg Oral BID  . enoxaparin (LOVENOX) injection  40 mg  Subcutaneous Q24H  . furosemide  20 mg Oral Daily  . lisinopril  2.5 mg Oral Daily  . [START ON 05/11/2014] pantoprazole  40 mg Oral Daily  . pneumococcal 23 valent vaccine  0.5 mL Intramuscular Tomorrow-1000  . sodium chloride  3 mL Intravenous Q12H   Continuous Infusions:  PRN Meds:.sodium chloride, acetaminophen, diazepam, sodium chloride, zolpidem  Assessment/Plan: [name] is a 58 y.o. female w/ PMHx of HTN, HLD, SLE, GERD, CAD (NSTEMI 154008), dCHF (grade 2, 08/23/2013), Depression, and Fibromyalgia, admitted for suspected TIA.   TIA- Patient w/ left-sided facial numbness and possible facial droop on 05/05/14. No further symptoms. CT head showed no acute intracranial abnormality, but is suggestive of an old right occipital infarct with secondary encephalomalacia. ECHO shows EF of 55-60% w/ moderate LVH, severe focal basal septal hypertrophy, and inferior hypokinesis and grade 1 diastolic dysfunction. CA dopplers 1-39% stenosis and antegrade flow. HbA1c 5.9, LDL 127, otherwise lipid panel wnl. UDS negative. -Continue home BP meds; Lisinopril 2.5 mg qd, Coreg 6.25 mg bid, Lasix 20 mg qd -Lipitor 80 mg qhs  -ASA 81 mg  -Discharge w/ close follow up in [facility] clinic -Patient will need further evaluation of SLE by rheumatologist given increased thromboembolic events.   SLE- Patient previously told she has Lupus, found to be ANA positive (1:80) on 06/11/2009. Does admit to joint pain, malar rash, and symptoms suggestive of Reynaud's. -Repeat ANA + dsDNA pending  -Further workup and management as an outpatient   CAD- NSTEMI in 2012 2/2 cocaine abuse, s/p BMS to proximal LCx. LHC in 2012 also showed mid-LAD stenosis and occlusion of the distal RCA. Chest pain overnight, no EKG changes, troponins negative x3. Most likely related to GI symptoms rather than cardiac chest pain.  -Continue ASA, Lipitor, Coreg, Lisinopril  -Patient will need cardiology referral and follow up as an  outpatient.   Chronic dCHF- Previous ECHO from 08/23/2013 shows EF of 60-65% w/ grade 2 diastolic dysfunction. Somewhat improved on ECHO yesterday, only grade 1 diastolic dysfunction. Takes Lasix 20 mg qd at home. No SOB, LE edema, or other signs of volume overload.  -Continue Lasix   HTN- Normotensive -Continue home meds   HLD- Lipid panel from yesterday wnl except for LDL of 127.  -Lipitor 80 mg qhs   Depression/Anxiety- Stable  -Continue Valium 2 mg q6h prn  -Ambien 5 mg qhs prn   DVT/PE PPx- Lovenox [place]  Dispo: Discharge today.   The patient does have a current PCP ([name], MD) and does need an [facility] hospital follow-up appointment after discharge.  The patient does not have transportation limitations that hinder transportation to clinic appointments.  .Services Needed at time of discharge: Y = Yes, Blank = No PT:   OT:   RN:   Equipment:  Other:     LOS: 1 day   Corky Sox, MD 05/10/2014, 11:04 AM

## 2014-05-10 NOTE — H&P (Signed)
  Date: 05/10/2014  Patient name: Elizabeth [name]  Medical record number: [id number]  Date of birth: [birth year]   I have seen and evaluated Elizabeth [name] and discussed their care with the Residency Team.  Briefly, Elizabeth [name] is a 58yo woman with PMH of HTN, HLD, SLE, GERD, CAD with NSTEMI in 2012, dCHF, depression and FM who presented to clinic reporting that she had left sided facial numbness and droop over the weekend, specifically on Saturday morning upon waking.  These symptoms lasted under an hour and resolved.  She had similar numbness to the tongue on Monday but this resolved as well.  No further symptoms were noted and no motor symptoms were noted.  She has no history of CVA. She does have h/o CAD and intermittent chest pain and palpitations.  She has a history of lupus for which she was noted to be ANA positive in the past.  She is a smoker, is down to about 3 cigarettes per day.  She drinks ETOH and is a former cocaine user ( none in last 7 months per patient).  She has HTN, but has been out of her medications for a few months.  When we saw her, she was having a headache, but her neuro exam was non focal and without sensory deficit.  CT head revealed no acute abnormality, old occipital infarct.  EKG revealed nonspecific TWI in anterior leads and 1,2 and AVL.  Assessment and Plan: I have seen and evaluated the patient as outlined above. I agree with the formulated Assessment and Plan as detailed in the residents' admission note, with the following changes:   1. TIA - This was likely a TIA given her symptoms and history; she was supposed to be on HTN medications and aspirin and also describes occasional palpations (? Afib) - She had CT head as noted above - given time since last symptoms, will not get MRI/MRA - TTE, Carotid dopplers - Have restarted her home BP medications given she is >48 hours from her symptoms - ASA, statin  2. SLE - Repeat work up noted in resident note -  Further work up outpatient  Other issues are chronic and noted in the resident note.    Sid Falcon, MD 6/25/20152:49 PM

## 2014-05-10 NOTE — Discharge Instructions (Signed)
1. Your follow up is scheduled as follows:  Duwaine Maxin, DO  On 05/21/2014 @ 2:45 PM  [address] [place] [address] [phone]  2. Please take all medications as prescribed.   Continue taking ALL your meds. Keep taking Aspirin 81 mg DAILY  Take Protonix 40 mg daily  3. If you have worsening of your symptoms or new symptoms arise, please call the clinic [phone]), or go to the ER immediately if symptoms are severe.     Transient Ischemic Attack A transient ischemic attack (TIA) is a "warning stroke" that causes stroke-like symptoms. Unlike a stroke, a TIA does not cause permanent damage to the brain. The symptoms of a TIA can happen very fast and do not last long. It is important to know the symptoms of a TIA and what to do. This can help prevent a major stroke or death. CAUSES   A TIA is caused by a temporary blockage in an artery in the brain or neck (carotid artery). The blockage does not allow the brain to get the blood supply it needs and can cause different symptoms. The blockage can be caused by either:  A blood clot.  Fatty buildup (plaque) in a neck or brain artery. RISK FACTORS  High blood pressure (hypertension).  High cholesterol.  Diabetes mellitus.  Heart disease.  The build up of plaque in the blood vessels (peripheral artery disease or atherosclerosis).  The build up of plaque in the blood vessels providing blood and oxygen to the brain (carotid artery stenosis).  An abnormal heart rhythm (atrial fibrillation).  Obesity.  Smoking.  Taking oral contraceptives (especially in combination with smoking).  Physical inactivity.  A diet high in fats, salt (sodium), and calories.  Alcohol use.  Use of illegal drugs (especially cocaine and methamphetamine).  Being female.  Being African American.  Being over the age of 70.  Family history of stroke.  Previous history of blood clots, stroke, TIA, or heart attack.  Sickle cell  disease. SYMPTOMS  TIA symptoms are the same as a stroke but are temporary. These symptoms usually develop suddenly, or may be newly present upon awakening from sleep:  Sudden weakness or numbness of the face, arm, or leg, especially on one side of the body.  Sudden trouble walking or difficulty moving arms or legs.  Sudden confusion.  Sudden personality changes.  Trouble speaking (aphasia) or understanding.  Difficulty swallowing.  Sudden trouble seeing in one or both eyes.  Double vision.  Dizziness.  Loss of balance or coordination.  Sudden severe headache with no known cause.  Trouble reading or writing.  Loss of bowel or bladder control.  Loss of consciousness. DIAGNOSIS  Your caregiver may be able to determine the presence or absence of a TIA based on your symptoms, history, and physical exam. Computed tomography (CT scan) of the brain is usually performed to help identify a TIA. Other tests may be done to diagnose a TIA. These tests may include:  Electrocardiography.  Continuous heart monitoring.  Echocardiography.  Carotid ultrasonography.  Magnetic resonance imaging (MRI).  A scan of the brain circulation.  Blood tests. PREVENTION  The risk of a TIA can be decreased by appropriately treating high blood pressure, high cholesterol, diabetes, heart disease, and obesity and by quitting smoking, limiting alcohol, and staying physically active. TREATMENT  Time is of the essence. Since the symptoms of TIA are the same as a stroke, it is important to seek treatment as soon as possible because you  may need a medicine to dissolve the clot (thrombolytic) that cannot be given if too much time has passed. Treatment options vary. Treatment options may include rest, oxygen, intravenous (IV) fluids, and medicines to thin the blood (anticoagulants). Medicines and diet may be used to address diabetes, high blood pressure, and other risk factors. Measures will be taken to  prevent short-term and long-term complications, including infection from breathing foreign material into the lungs (aspiration pneumonia), blood clots in the legs, and falls. Treatment options include procedures to either remove plaque in the carotid arteries or dilate carotid arteries that have narrowed due to plaque. Those procedures are:  Carotid endarterectomy.  Carotid angioplasty and stenting. HOME CARE INSTRUCTIONS   Take all medicines prescribed by your caregiver. Follow the directions carefully. Medicines may be used to control risk factors for a stroke. Be sure you understand all your medicine instructions.  You may be told to take aspirin or the anticoagulant warfarin. Warfarin needs to be taken exactly as instructed.  Taking too much or too little warfarin is dangerous. Too much warfarin increases the risk of bleeding. Too little warfarin continues to allow the risk for blood clots. While taking warfarin, you will need to have regular blood tests to measure your blood clotting time. A PT blood test measures how long it takes for blood to clot. Your PT is used to calculate another value called an INR. Your PT and INR help your caregiver to adjust your dose of warfarin. The dose can change for many reasons. It is critically important that you take warfarin exactly as prescribed.  Many foods, especially foods high in vitamin K can interfere with warfarin and affect the PT and INR. Foods high in vitamin K include spinach, kale, broccoli, cabbage, collard and turnip greens, brussels sprouts, peas, cauliflower, seaweed, and parsley as well as beef and pork liver, green tea, and soybean oil. You should eat a consistent amount of foods high in vitamin K. Avoid major changes in your diet, or notify your caregiver before changing your diet. Arrange a visit with a dietitian to answer your questions.  Many medicines can interfere with warfarin and affect the PT and INR. You must tell your caregiver  about any and all medicines you take, this includes all vitamins and supplements. Be especially cautious with aspirin and anti-inflammatory medicines. Do not take or discontinue any prescribed or over-the-counter medicine except on the advice of your caregiver or pharmacist.  Warfarin can have side effects, such as excessive bruising or bleeding. You will need to hold pressure over cuts for longer than usual. Your caregiver or pharmacist will discuss other potential side effects.  Avoid sports or activities that may cause injury or bleeding.  Be mindful when shaving, flossing your teeth, or handling sharp objects.  Alcohol can change the body's ability to handle warfarin. It is best to avoid alcoholic drinks or consume only very small amounts while taking warfarin. Notify your caregiver if you change your alcohol intake.  Notify your dentist or other caregivers before procedures.  Eat a diet that includes 5 or more servings of fruits and vegetables each day. This may reduce the risk of stroke. Certain diets may be prescribed to address high blood pressure, high cholesterol, diabetes, or obesity.  A low-sodium, low-saturated fat, low-trans fat, low-cholesterol diet is recommended to manage high blood pressure.  A low-saturated fat, low-trans fat, low-cholesterol, and high-fiber diet may control cholesterol levels.  A controlled-carbohydrate, controlled-sugar diet is recommended to manage diabetes.  A  reduced-calorie, low-sodium, low-saturated fat, low-trans fat, low-cholesterol diet is recommended to manage obesity.  Maintain a healthy weight.  Stay physically active. It is recommended that you get at least 30 minutes of activity on most or all days.  Do not smoke.  Limit alcohol use even if you are not taking warfarin. Moderate alcohol use is considered to be:  No more than 2 drinks each day for men.  No more than 1 drink each day for nonpregnant women.  Stop drug abuse.  Home  safety. A safe home environment is important to reduce the risk of falls. Your caregiver may arrange for specialists to evaluate your home. Having grab bars in the bedroom and bathroom is often important. Your caregiver may arrange for equipment to be used at home, such as raised toilets and a seat for the shower.  Follow all instructions for follow-up with your caregiver. This is very important. This includes any referrals and lab tests. Proper follow up can prevent a stroke or another TIA from occurring. SEEK MEDICAL CARE IF:  You have personality changes.  You have difficulty swallowing.  You are seeing double.  You have dizziness.  You have a fever.  You have skin breakdown. SEEK IMMEDIATE MEDICAL CARE IF:  Any of these symptoms may represent a serious problem that is an emergency. Do not wait to see if the symptoms will go away. Get medical help right away. Call your local emergency services (911 in [place] Do not drive yourself to the hospital.  You have sudden weakness or numbness of the face, arm, or leg, especially on one side of the body.  You have sudden trouble walking or difficulty moving arms or legs.  You have sudden confusion.  You have trouble speaking (aphasia) or understanding.  You have sudden trouble seeing in one or both eyes.  You have a loss of balance or coordination.  You have a sudden, severe headache with no known cause.  You have new chest pain or an irregular heartbeat.  You have a partial or total loss of consciousness. MAKE SURE YOU:   Understand these instructions.  Will watch your condition.  Will get help right away if you are not doing well or get worse. Document Released: 08/12/2005 Document Revised: 11/07/2013 Document Reviewed: 12/26/2009 [facility] Patient Information 2015 [place], [place]. This information is not intended to replace advice given to you by your health care provider. Make sure you discuss any questions you have with your  health care provider. STROKE/TIA DISCHARGE INSTRUCTIONS SMOKING Cigarette smoking nearly doubles your risk of having a stroke & is the single most alterable risk factor  If you smoke or have smoked in the last 12 months, you are advised to quit smoking for your health.  Most of the excess cardiovascular risk related to smoking disappears within a year of stopping.  Ask you doctor about anti-smoking medications  [place] Quit Line: 1-800-QUIT NOW  Free Smoking Cessation Classes [phone]  CHOLESTEROL Know your levels; limit fat & cholesterol in your diet  Lipid Panel     Component Value Date/Time   CHOL 192 05/10/2014 0135   TRIG 84 05/10/2014 0135   HDL 48 05/10/2014 0135   CHOLHDL 4.0 05/10/2014 0135   VLDL 17 05/10/2014 0135   LDLCALC 127* 05/10/2014 0135      Many patients benefit from treatment even if their cholesterol is at goal.  Goal: Total Cholesterol (CHOL) less than 160  Goal:  Triglycerides (TRIG) less than 150  Goal:  HDL greater than 40  Goal:  LDL (LDLCALC) less than 100   BLOOD PRESSURE American Stroke Association blood pressure target is less that 120/80 mm/Hg  Your discharge blood pressure is:  BP: 118/71 mmHg  Monitor your blood pressure  Limit your salt and alcohol intake  Many individuals will require more than one medication for high blood pressure  DIABETES (A1c is a blood sugar average for last 3 months) Goal HGBA1c is under 7% (HBGA1c is blood sugar average for last 3 months)  Diabetes: No known diagnosis of diabetes    Lab Results  Component Value Date   HGBA1C 5.9* 05/09/2014     Your HGBA1c can be lowered with medications, healthy diet, and exercise.  Check your blood sugar as directed by your physician  Call your physician if you experience unexplained or low blood sugars.  PHYSICAL ACTIVITY/REHABILITATION Goal is 30 minutes at least 4 days per week  Activity: Increase activity slowly, Therapies:  Return to work: N/A  Activity decreases  your risk of heart attack and stroke and makes your heart stronger.  It helps control your weight and blood pressure; helps you relax and can improve your mood.  Participate in a regular exercise program.  Talk with your doctor about the best form of exercise for you (dancing, walking, swimming, cycling).  DIET/WEIGHT Goal is to maintain a healthy weight  Your discharge diet is: Cardiac Your height is:  Height: _0  (157.5 cm) Your current weight is: Weight: 87.726 kg (193 lb 6.4 oz) Your Body Mass Index (BMI) is:  BMI (Calculated): 35.2  Following the type of diet specifically designed for you will help prevent another stroke.  Your goal weight range is:  104-131 lbs.  Your goal Body Mass Index (BMI) is 19-24.  Healthy food habits can help reduce 3 risk factors for stroke:  High cholesterol, hypertension, and excess weight.  RESOURCES Stroke/Support Group:  Call [phone]   STROKE EDUCATION PROVIDED/REVIEWED AND GIVEN TO PATIENT Stroke warning signs and symptoms How to activate emergency medical system (call 911). Medications prescribed at discharge. Need for follow-up after discharge. Personal risk factors for stroke. Pneumonia vaccine given: No Flu vaccine given: No My questions have been answered, the writing is legible, and I understand these instructions.  I will adhere to these goals & educational materials that have been provided to me after my discharge from the hospital.

## 2014-05-12 NOTE — Progress Notes (Signed)
Case discussed with Dr. Algis Liming soon after the resident saw the patient.  We reviewed the resident's history and exam and pertinent patient test results.  I agree with the assessment, diagnosis, and plan of care documented in the resident's note.

## 2014-05-15 NOTE — Discharge Summary (Signed)
I saw Elizabeth [name] on day of discharge and assisted with the discharge planning.

## 2014-05-21 ENCOUNTER — Ambulatory Visit: Payer: Medicare Other | Admitting: Internal Medicine

## 2014-06-14 ENCOUNTER — Other Ambulatory Visit: Payer: Self-pay | Admitting: Internal Medicine

## 2014-06-14 DIAGNOSIS — Z1231 Encounter for screening mammogram for malignant neoplasm of breast: Secondary | ICD-10-CM

## 2014-07-03 ENCOUNTER — Ambulatory Visit
Admission: RE | Admit: 2014-07-03 | Discharge: 2014-07-03 | Disposition: A | Discharge status: Home / Self Care | Payer: Medicare Other | Source: Ambulatory Visit | Attending: Internal Medicine | Admitting: Internal Medicine

## 2014-07-03 DIAGNOSIS — Z1231 Encounter for screening mammogram for malignant neoplasm of breast: Secondary | ICD-10-CM | POA: Diagnosis not present

## 2014-07-05 DIAGNOSIS — H1030 Unspecified acute conjunctivitis, unspecified eye: Secondary | ICD-10-CM | POA: Diagnosis not present

## 2014-07-11 ENCOUNTER — Ambulatory Visit: Payer: Medicare Other | Admitting: Internal Medicine

## 2014-07-13 ENCOUNTER — Ambulatory Visit: Payer: Medicare Other | Admitting: Internal Medicine

## 2014-08-02 DIAGNOSIS — H4011X0 Primary open-angle glaucoma, stage unspecified: Secondary | ICD-10-CM | POA: Diagnosis not present

## 2014-08-13 ENCOUNTER — Ambulatory Visit: Payer: Medicare Other | Admitting: Internal Medicine

## 2014-08-27 DIAGNOSIS — H4011X2 Primary open-angle glaucoma, moderate stage: Secondary | ICD-10-CM | POA: Diagnosis not present

## 2014-09-04 ENCOUNTER — Ambulatory Visit: Payer: Medicare Other | Admitting: Internal Medicine

## 2014-09-07 ENCOUNTER — Ambulatory Visit: Payer: Medicare Other | Admitting: Internal Medicine

## 2014-09-12 ENCOUNTER — Ambulatory Visit: Payer: Medicare Other | Admitting: Internal Medicine

## 2014-09-17 ENCOUNTER — Ambulatory Visit: Payer: Medicare Other | Admitting: Internal Medicine

## 2014-09-19 DIAGNOSIS — H4011X2 Primary open-angle glaucoma, moderate stage: Secondary | ICD-10-CM | POA: Diagnosis not present

## 2014-11-02 ENCOUNTER — Ambulatory Visit: Payer: Medicare Other | Admitting: Internal Medicine

## 2014-12-25 ENCOUNTER — Encounter: Payer: Self-pay | Admitting: Internal Medicine

## 2014-12-25 ENCOUNTER — Ambulatory Visit (INDEPENDENT_AMBULATORY_CARE_PROVIDER_SITE_OTHER): Payer: Medicare Other | Admitting: Internal Medicine

## 2014-12-25 VITALS — BP 160/82 | HR 73 | Temp 98.2°F | Ht 62.0 in | Wt 202.2 lb

## 2014-12-25 DIAGNOSIS — G4700 Insomnia, unspecified: Secondary | ICD-10-CM | POA: Insufficient documentation

## 2014-12-25 DIAGNOSIS — Z418 Encounter for other procedures for purposes other than remedying health state: Secondary | ICD-10-CM | POA: Diagnosis not present

## 2014-12-25 DIAGNOSIS — G459 Transient cerebral ischemic attack, unspecified: Secondary | ICD-10-CM | POA: Diagnosis not present

## 2014-12-25 DIAGNOSIS — R7309 Other abnormal glucose: Secondary | ICD-10-CM

## 2014-12-25 DIAGNOSIS — R220 Localized swelling, mass and lump, head: Secondary | ICD-10-CM

## 2014-12-25 DIAGNOSIS — I255 Ischemic cardiomyopathy: Secondary | ICD-10-CM | POA: Diagnosis not present

## 2014-12-25 DIAGNOSIS — E785 Hyperlipidemia, unspecified: Secondary | ICD-10-CM

## 2014-12-25 DIAGNOSIS — Z9119 Patient's noncompliance with other medical treatment and regimen: Secondary | ICD-10-CM | POA: Diagnosis not present

## 2014-12-25 DIAGNOSIS — M329 Systemic lupus erythematosus, unspecified: Secondary | ICD-10-CM

## 2014-12-25 DIAGNOSIS — R7303 Prediabetes: Secondary | ICD-10-CM

## 2014-12-25 DIAGNOSIS — M797 Fibromyalgia: Secondary | ICD-10-CM

## 2014-12-25 DIAGNOSIS — I10 Essential (primary) hypertension: Secondary | ICD-10-CM | POA: Diagnosis not present

## 2014-12-25 DIAGNOSIS — Z91199 Patient's noncompliance with other medical treatment and regimen due to unspecified reason: Secondary | ICD-10-CM

## 2014-12-25 DIAGNOSIS — Z299 Encounter for prophylactic measures, unspecified: Secondary | ICD-10-CM

## 2014-12-25 LAB — COMPLETE METABOLIC PANEL WITH GFR
ALK PHOS: 130 U/L — AB (ref 39–117)
ALT: 16 U/L (ref 0–35)
AST: 17 U/L (ref 0–37)
Albumin: 3.9 g/dL (ref 3.5–5.2)
BUN: 11 mg/dL (ref 6–23)
CALCIUM: 9.2 mg/dL (ref 8.4–10.5)
CO2: 27 meq/L (ref 19–32)
Chloride: 105 mEq/L (ref 96–112)
Creat: 0.83 mg/dL (ref 0.50–1.10)
GFR, EST NON AFRICAN AMERICAN: 79 mL/min
GFR, Est African American: >89 mL/min
GLUCOSE: 86 mg/dL (ref 70–99)
Potassium: 4.6 mEq/L (ref 3.5–5.3)
Sodium: 140 mEq/L (ref 135–145)
Total Bilirubin: 0.4 mg/dL (ref 0.2–1.2)
Total Protein: 7.3 g/dL (ref 6.0–8.3)

## 2014-12-25 LAB — POCT GLYCOSYLATED HEMOGLOBIN (HGB A1C): Hemoglobin A1C: 5.9

## 2014-12-25 LAB — CK: Total CK: 126 U/L (ref 7–177)

## 2014-12-25 LAB — GLUCOSE, CAPILLARY: Glucose-Capillary: 97 mg/dL (ref 70–99)

## 2014-12-25 MED ORDER — ZOLPIDEM TARTRATE 5 MG PO TABS: 5.0000 mg | ORAL_TABLET | Freq: Every evening | ORAL | Status: DC | PRN

## 2014-12-25 MED ORDER — DIAZEPAM 2 MG PO TABS: 2.0000 mg | ORAL_TABLET | Freq: Four times a day (QID) | ORAL | Status: DC | PRN

## 2014-12-25 MED ORDER — CARVEDILOL 6.25 MG PO TABS: 6.2500 mg | ORAL_TABLET | Freq: Two times a day (BID) | ORAL | Status: DC

## 2014-12-25 MED ORDER — LISINOPRIL 2.5 MG PO TABS: 2.5000 mg | ORAL_TABLET | Freq: Every day | ORAL | Status: DC

## 2014-12-25 MED ORDER — ATORVASTATIN CALCIUM 80 MG PO TABS: 80.0000 mg | ORAL_TABLET | Freq: Every day | ORAL | Status: DC

## 2014-12-25 MED ORDER — FUROSEMIDE 20 MG PO TABS: 20.0000 mg | ORAL_TABLET | Freq: Every day | ORAL | Status: DC

## 2014-12-25 MED ORDER — ASPIRIN 81 MG PO TABS: 81.0000 mg | ORAL_TABLET | Freq: Every day | ORAL | Status: DC

## 2014-12-25 MED ORDER — FLUTICASONE PROPIONATE 50 MCG/ACT NA SUSP: 1.0000 | Freq: Every day | NASAL | Status: DC

## 2014-12-25 MED ORDER — PANTOPRAZOLE SODIUM 40 MG PO TBEC: 40.0000 mg | DELAYED_RELEASE_TABLET | Freq: Every day | ORAL | Status: DC

## 2014-12-25 NOTE — Assessment & Plan Note (Signed)
I don't think she has a lupus flare currently. With multiple problems to manage this visit, I will defer from redoing antibody testing. I will refer her to rheumatology as per her wishes.

## 2014-12-25 NOTE — Assessment & Plan Note (Signed)
Anti-hypertensive medications restarted.  Coreg, Aspirin, ACE restarted.

## 2014-12-25 NOTE — Assessment & Plan Note (Signed)
Insomnia and Anxiety remain the less investigated issues this visit.  Next visit, will do a sleep hygiene assessment.  Anxiety - ? GAD will re-evaluate next visit.  For this visit, I have prescribed ambient 34m as needed for sleep.

## 2014-12-25 NOTE — Assessment & Plan Note (Signed)
BP Readings from Last 3 Encounters:  12/25/14 160/82  05/10/14 118/71  05/09/14 158/101   Uncontrolled BP secondary to non-compliance.  Will restart coreg, lisinopril and lasix and re-evaluate in 4-6 weeks.

## 2014-12-25 NOTE — Assessment & Plan Note (Signed)
Will prescribe lyrica once BUN/cr are back from today's labs

## 2014-12-25 NOTE — Assessment & Plan Note (Signed)
Unsure of etiology about the swelling. Could be allergic?  Will try flonase. Will refer to ENT for consult.

## 2014-12-25 NOTE — Progress Notes (Addendum)
Subjective:    Patient ID: Elizabeth [name], female    DOB: 04/09/1956, 59 y.o.   MRN: [id number]  HPI [name] is a 59 year old lady who has a past medical history significant for TIA last year (06/15), lupus, fibromyalgia, hypertension, hyperlipidemia, ischemic cardiomyopathy (ECHO 05/09/14 55-60% EF, grade 1 diastolic dysfunction, mild to basal mid-inferior hypokinesis, moderately dilated LA), CAD with previous history of NSTEMI secondary to cocaine use, smoking, alcohol use and non-compliance.    TIA: The patient was admitted to [facility] from 05/09/14 to 05/10/14 for TIA work up (numb on her face, droop). She was booked up for a hospital discharge follow up with Dr Redmond Pulling on 05/21/2014 however she did not show up. She states that she had transportation problems on that day, however she never rescheduled. No such attacks have happened since then.   Hypertension: She has not been taking any of her medications since an unspecified period of time. Her blood pressure is high today. She reports headaches off an on, which stay for few minutes to hours and get better on its own. She reports heaviness in her eyes, she uses glasses to read, and does not report any recent changes in her vision.   CAD, ICM: She denies exertional shortness of breath, chest pain, orthopnea, or PND.  Lupus: The patient has been worked up for lupus in the past (see autoimmune panel in Results Review). I do not see if she has been on any medications for it in med history. She reports diffuse bodyaches, extreme fatigue and skin discoloration on the face. She expresses concern over this and would like to go to a rheumatologist.  Nasal turbinate/septum swelling - The patient was breathing noisily all through out the visit. She did not volunteer to mention this problem. On asking, she said she is unsure of what is going on with her nose, as it smells foul, and once (unspecified period of time back) she blew  out something that looked like a hard white fragment. On exam, her right nares seemed blocked due to either turbinate swelling or nasal septal swelling. No nosebleeds reported. Is she still using cocaine, she reported no. No perforation seen on septum. She denies sinus pressure. Endorses headaches - not related to time.  Prediabetes - Unaware of this diagnosis. Has had weight gain this visit of about 10 lbs from 193 to 203 lbs. She denies any symptoms of polyuria or polyphagia.  Fibromyalgia - Reports diffuse aches and pains all over her body. Not taking any medication right now, however reports that she has benefitted from her sister's lyrica in the past.   Anxiety - The patient was ready to cry through out the visit, she was fidgety and restless. She also reports fatigue, irritability and difficulty concentrating. She has had these symptoms for a very long time.    Insomnia - The patient reports not being able to sleep at night and being sleepy during the day.   Smoking - Smokes 2-3 cigarettes a day.   Alcohol - takes 1 beer sometimes.   Cocaine - denies use since TIA.   Review of Systems  Constitutional: Positive for fatigue. Negative for fever, chills, diaphoresis, activity change, appetite change and unexpected weight change.  HENT: Positive for postnasal drip. Negative for dental problem, drooling, ear discharge, ear pain, facial swelling, hearing loss, nosebleeds, rhinorrhea, sinus pressure, sneezing, sore throat, tinnitus, trouble swallowing and voice change.   Eyes: Negative for photophobia, pain, discharge, redness,  itching and visual disturbance.  Respiratory: Negative for apnea, cough, choking, chest tightness, shortness of breath, wheezing and stridor.   Cardiovascular: Negative for chest pain, palpitations and leg swelling.  Gastrointestinal: Negative for nausea, vomiting, diarrhea, abdominal distention, anal bleeding and rectal pain.  Endocrine: Negative for polyuria.    Genitourinary: Negative for dysuria, flank pain, decreased urine volume, difficulty urinating, vaginal pain and menstrual problem (going through menopause).  Musculoskeletal: Positive for myalgias and arthralgias. Negative for back pain, joint swelling, gait problem, neck pain and neck stiffness.  Skin: Negative for rash.  Neurological: Positive for headaches. Negative for dizziness, tremors, seizures, syncope, facial asymmetry, speech difficulty, weakness, light-headedness and numbness.  Psychiatric/Behavioral: Negative for sleep disturbance, dysphoric mood and decreased concentration. The patient is nervous/anxious. The patient is not hyperactive.        Objective:   Physical Exam  Constitutional: She is oriented to person, place, and time. She appears well-developed and well-nourished. No distress.  HENT:  Head: Normocephalic and atraumatic.  Right Ear: External ear normal.  Left Ear: External ear normal.  Nose: Mucosal edema present. Right sinus exhibits no maxillary sinus tenderness and no frontal sinus tenderness. Left sinus exhibits no maxillary sinus tenderness and no frontal sinus tenderness.  Mouth/Throat: Oropharynx is clear and moist.  Right nares - septal or turbinate swelling, very narrow passage, patient cannot breathe from this nare.  Eyes: Conjunctivae and EOM are normal. Pupils are equal, round, and reactive to light. Right eye exhibits no discharge. Left eye exhibits no discharge. No scleral icterus.  Neck: Normal range of motion. Neck supple. No JVD present. No tracheal deviation present. No thyromegaly present.  Cardiovascular: Normal rate, regular rhythm and intact distal pulses.  Exam reveals no gallop and no friction rub.   Murmur heard. Pulmonary/Chest: Effort normal and breath sounds normal. No stridor. No respiratory distress. She has no wheezes. She has no rales. She exhibits no tenderness.  Abdominal: Soft. Bowel sounds are normal.  Musculoskeletal: She exhibits  edema (mild).  Lymphadenopathy:    She has no cervical adenopathy.  Neurological: She is alert and oriented to person, place, and time.  Skin: Skin is warm.  Mild discoloration on face/chin/cheeks.   Psychiatric:  Patient looked very anxious.      Assessment & Plan:    Next visit:   - HTN evaluation - medication compliance evaluation - lasix to continue? Lower extremity edema reevaluate. - Health maintainence - Follow up on referrals - rheum and ENT.   Please see problem based charting for the rest.

## 2014-12-25 NOTE — Assessment & Plan Note (Signed)
Today A1c 5.9 Counseled patient about prediabetes, diet and exercise.

## 2014-12-25 NOTE — Assessment & Plan Note (Signed)
The topics discussed today with the patient:   Smoking cessation  Staying clean from drugs  Compliance with medications  Diet and exercise and their importance in TIA, HTN, fibromyalgia.  Keeping appointments as directed, or rescheduling them if she cannot make it.  Lipid panel will be done next, once the patient starts taking her statin.

## 2014-12-25 NOTE — Patient Instructions (Signed)
Ms Heavenly,   Please start taking all your medications.  Please try to stop smoking.  Please keep your appointment with rheumoatologist where I will be referring you.  We will meet in 2 months to re-evaluate.  We did some blood work and I will call you with results. I will start your fibromyalgia medications once I see your blood work.  Thanks, [name] MD MPH 12/25/2014 11:56 AM

## 2014-12-26 DIAGNOSIS — G4700 Insomnia, unspecified: Secondary | ICD-10-CM | POA: Diagnosis not present

## 2014-12-26 DIAGNOSIS — M797 Fibromyalgia: Secondary | ICD-10-CM | POA: Diagnosis not present

## 2014-12-27 LAB — TSH: TSH: 0.865 u[IU]/mL (ref 0.350–4.500)

## 2015-01-08 DIAGNOSIS — H4010X2 Unspecified open-angle glaucoma, moderate stage: Secondary | ICD-10-CM | POA: Diagnosis not present

## 2015-01-09 DIAGNOSIS — J329 Chronic sinusitis, unspecified: Secondary | ICD-10-CM | POA: Diagnosis not present

## 2015-01-09 DIAGNOSIS — R439 Unspecified disturbances of smell and taste: Secondary | ICD-10-CM | POA: Diagnosis not present

## 2015-03-12 DIAGNOSIS — H04123 Dry eye syndrome of bilateral lacrimal glands: Secondary | ICD-10-CM | POA: Diagnosis not present

## 2015-03-19 ENCOUNTER — Telehealth: Payer: Self-pay | Admitting: *Deleted

## 2015-03-19 NOTE — Telephone Encounter (Signed)
I agree with your recommendation to go to the ED for further evaluation of CP ASAP. Thank you

## 2015-03-19 NOTE — Telephone Encounter (Signed)
Pt call with c/o chest pain, area is close to left breast.  Onset 2 - 3 days ago.  Pain is on and off. Chest feels heavy with sharp pains. Rates pain 8/10 C/o SOB, some diaphoreses. denies nausea. Hx : MI  Pt advised to go to ED now, she has a ride and will leave now. Also advised if pain is severe call EMS.

## 2015-04-09 ENCOUNTER — Ambulatory Visit (INDEPENDENT_AMBULATORY_CARE_PROVIDER_SITE_OTHER): Payer: Medicare Other | Admitting: Internal Medicine

## 2015-04-09 ENCOUNTER — Encounter: Payer: Self-pay | Admitting: Internal Medicine

## 2015-04-09 ENCOUNTER — Ambulatory Visit (HOSPITAL_COMMUNITY)
Admission: RE | Admit: 2015-04-09 | Discharge: 2015-04-09 | Disposition: A | Discharge status: Home / Self Care | Payer: Medicare Other | Source: Ambulatory Visit | Attending: Internal Medicine | Admitting: Internal Medicine

## 2015-04-09 VITALS — BP 134/85 | HR 83 | Temp 97.6°F | Wt 204.7 lb

## 2015-04-09 DIAGNOSIS — M7989 Other specified soft tissue disorders: Secondary | ICD-10-CM | POA: Insufficient documentation

## 2015-04-09 DIAGNOSIS — M79604 Pain in right leg: Secondary | ICD-10-CM | POA: Insufficient documentation

## 2015-04-09 DIAGNOSIS — M79605 Pain in left leg: Secondary | ICD-10-CM | POA: Diagnosis not present

## 2015-04-09 DIAGNOSIS — M79602 Pain in left arm: Secondary | ICD-10-CM

## 2015-04-09 DIAGNOSIS — F17210 Nicotine dependence, cigarettes, uncomplicated: Secondary | ICD-10-CM

## 2015-04-09 DIAGNOSIS — I10 Essential (primary) hypertension: Secondary | ICD-10-CM | POA: Diagnosis not present

## 2015-04-09 DIAGNOSIS — R51 Headache: Secondary | ICD-10-CM | POA: Diagnosis not present

## 2015-04-09 DIAGNOSIS — R519 Headache, unspecified: Secondary | ICD-10-CM | POA: Insufficient documentation

## 2015-04-09 DIAGNOSIS — G4700 Insomnia, unspecified: Secondary | ICD-10-CM | POA: Diagnosis not present

## 2015-04-09 NOTE — Assessment & Plan Note (Signed)
Patient continues to experience insomnia on occasion. She is not using the ambien 5 mg tablets that were prescribed at the last visit. She finds it difficult to fall asleep and difficult to stay asleep. She appears to have very poor sleep hygiene (goes to sleep at varying times, sleeps with TV on, drinks soda in the evening hours). Sleep hygiene was discussed at length at this visit, but the patient is hesitant to try any of the suggested sleep hygiene techniques.   - Continue to emphasize proper sleep hygiene
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
## 2015-04-09 NOTE — Assessment & Plan Note (Addendum)
Concern for left DVT, as patient presents with left lower extremity pain and pruritis after what she believes was a bug bite during a long bus ride two days prior. On exam, patient has significant tenderness and edema in the left lower extremity. She uses cigarettes. Swelling could also be from irritation secondary to a insect bite.  - LE venous dopplers of left lower extremity; treat if positive - If negative, this is more likely secondary to the bug bite and the patient can treat symptoms with calamine lotion and conservative therapy   Addendum: LE dopplers negative for DVT: Summary: - No evidence of deep vein or superficial thrombosis involving the  left lower extremity and right common femoral vein. - No evidence of Baker&'s cyst on the left.  - Called patient with results, told her to continue conservative management of the lower extremity (edema and pruritis most likely from presumed insect bite); advised to call or return to clinic with any worsening of symptoms
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
## 2015-04-09 NOTE — Assessment & Plan Note (Signed)
BP Readings from Last 3 Encounters:  04/09/15 134/85  12/25/14 160/82  05/10/14 118/71    Lab Results  Component Value Date   NA 140 12/25/2014   K 4.6 12/25/2014   CREATININE 0.83 12/25/2014    Assessment: Blood pressure control: improved   Progress toward BP goal: near goal   Comments: Patient reports compliance with her medications (but did not take them this morning)  Plan: Medications:  continue current medications; lisinopril 2.5 mg daily, Lasix 20 mg daily, carvedilol 6.25 mg BID    

## 2015-04-09 NOTE — Progress Notes (Signed)
VASCULAR LAB PRELIMINARY  PRELIMINARY  PRELIMINARY  PRELIMINARY  Left lower extremity venous duplex completed.    Preliminary report:  Left:  No evidence of DVT, superficial thrombosis, or Baker's cyst.  Kerolos Nehme, RVS 04/09/2015, 3:15 PM

## 2015-04-09 NOTE — Patient Instructions (Signed)
Please get the lower extremity doppler test before you leave the hospital today.  Please continue to keep track of your headaches. You can take tylenol to prevent the pain early in the day if they start to be more frequent or last longer. Please return for evaluation if this happens.  General Instructions:   Please bring your medicines with you each time you come to clinic.  Medicines may include prescription medications, over-the-counter medications, herbal remedies, eye drops, vitamins, or other pills.   Progress Toward Treatment Goals:  Treatment Goal 12/25/2013  Blood pressure deteriorated    Self Care Goals & Plans:  Self Care Goal 05/09/2014  Manage my medications take my medicines as prescribed; bring my medications to every visit; refill my medications on time  Monitor my health -  Eat healthy foods drink diet soda or water instead of juice or soda; eat more vegetables; eat foods that are low in salt; eat baked foods instead of fried foods; eat fruit for snacks and desserts  Be physically active -  Stop smoking -  Prevent falls -    No flowsheet data found.   Care Management & Community Referrals:  No flowsheet data found.

## 2015-04-09 NOTE — Assessment & Plan Note (Deleted)
BP Readings from Last 3 Encounters:  04/09/15 134/85  12/25/14 160/82  05/10/14 118/71    Lab Results  Component Value Date   NA 140 12/25/2014   K 4.6 12/25/2014   CREATININE 0.83 12/25/2014    Assessment: Blood pressure control: improved   Progress toward BP goal: near goal   Comments: Patient reports compliance with her medications (but did not take them this morning)  Plan: Medications:  continue current medications; lisinopril 2.5 mg daily, Lasix 20 mg daily, carvedilol 6.25 mg BID

## 2015-04-11 NOTE — Progress Notes (Signed)
Case discussed with Dr. Sherrine Maples at time of visit. We reviewed the resident's history and exam and pertinent patient test results. I agree with the assessment, diagnosis, and plan of care documented in the resident's note.

## 2015-04-12 ENCOUNTER — Telehealth: Payer: Self-pay | Admitting: *Deleted

## 2015-04-12 NOTE — Telephone Encounter (Signed)
Talked to pt -MD does not feel comfortable prescribing ABX over the telephone; no available appts this afternoon. Stated she could not come today d/t transportation problems anyway. Said she has been elevating her leg and staying in bed and the swelling has decreased. Reminded pt the clinic will be closed the w/e and Monday. Stated she will call back before 5PM or Tues of next week to schedule an appt after she talks to her sister who provides transportation. Instructed her to go to the ED or UC if condition changes-voiced understanding.

## 2015-04-12 NOTE — Addendum Note (Signed)
Addended by: Hulan Fray on: 04/12/2015 04:59 PM   Modules accepted: Orders

## 2015-04-12 NOTE — Telephone Encounter (Signed)
Message per pt - stated she was here Tuesday; had test done for DVT which was negative. Stated her leg is swelling, no pain, and she was not ordered any ABX.  Called pt - states her leg/foot is swollen; and the doctor should had put her on an antibiotic. Unable to come back in today for an appt. Thanks

## 2015-04-12 NOTE — Telephone Encounter (Signed)
At the time of her clinic visit it was not felt that antibiotics were necessary.  They may be required at this time if things have progressed, but she must come in for an evaluation as I am not comfortable with empirically treating her with antibiotics without reassessment to assure there is not another process going on to explain her unilateral LE edema (DVT ruled out last visit, ? abscess Vs lymphatic obstruction, etc.)  Please call Elizabeth [name] with this information and the need to be seen this afternoon in the clinic.  If she is willing to come for evaluation please schedule her for an appointment.  Thanks.

## 2015-04-16 ENCOUNTER — Telehealth: Payer: Self-pay | Admitting: *Deleted

## 2015-04-16 NOTE — Telephone Encounter (Signed)
-----  Message from Karlene Einstein, MD sent at 04/12/2015  3:06 PM EDT ----- Hello Dr. Eppie Gibson and Holley Raring,  Thank you for your messages and for helping to care for Elizabeth [name]. Agree that patient should return to clinic if she has significant swelling and pain. There were several small nodules (which did appear to be insect bites) and marked pruritis at the time of her clinic visit. Her tenderness was a secondary complaint and the leg was not erythematous at that time. With her scratching, it is certainly possible the leg has worsened or that she has developed a cellulitis.   I'm happy to have her come in on Tuesday to see me again as well (I don't see her scheduled today)... I have several openings then.  Thank you, Almyra Free

## 2015-04-16 NOTE — Telephone Encounter (Signed)
Called pt - no answer. Left message to see how her leg is doing and to call for an appt if needed.

## 2015-05-23 ENCOUNTER — Encounter: Payer: Self-pay | Admitting: Internal Medicine

## 2015-05-23 ENCOUNTER — Ambulatory Visit (INDEPENDENT_AMBULATORY_CARE_PROVIDER_SITE_OTHER): Payer: Medicare Other | Admitting: Internal Medicine

## 2015-05-23 VITALS — BP 161/90 | HR 73 | Temp 97.8°F | Ht 62.0 in | Wt 205.7 lb

## 2015-05-23 DIAGNOSIS — Z7982 Long term (current) use of aspirin: Secondary | ICD-10-CM

## 2015-05-23 DIAGNOSIS — I5032 Chronic diastolic (congestive) heart failure: Secondary | ICD-10-CM | POA: Diagnosis not present

## 2015-05-23 DIAGNOSIS — F17210 Nicotine dependence, cigarettes, uncomplicated: Secondary | ICD-10-CM | POA: Diagnosis not present

## 2015-05-23 DIAGNOSIS — M329 Systemic lupus erythematosus, unspecified: Secondary | ICD-10-CM

## 2015-05-23 DIAGNOSIS — M79674 Pain in right toe(s): Secondary | ICD-10-CM | POA: Diagnosis not present

## 2015-05-23 DIAGNOSIS — I10 Essential (primary) hypertension: Secondary | ICD-10-CM | POA: Diagnosis not present

## 2015-05-23 MED ORDER — FUROSEMIDE 20 MG PO TABS: 40.0000 mg | ORAL_TABLET | Freq: Every day | ORAL | Status: DC

## 2015-05-23 NOTE — Progress Notes (Signed)
Patient ID: Elizabeth [name], female   DOB: [birth year], 59 y.o.   MRN: [id number]    Subjective:   Patient ID: Elizabeth [name] female   DOB: March 08, 1956 59 y.o.   MRN: [id number]  HPI: Elizabeth [name] is a 59 y.o. woman with past medical history of SLE not on medical therapy, fibromyalgia, hypertension, hyperlipidemia, CAD s/p MI, grade 1 diastolic CHF, depression, TIA, prediabetes, history of cocaine abuse, and GERD who presents with multiple complaints.   She reports acute onset of right great toe 1 week ago with exquisite tenderness, warmth, erythema, and swelling that resolved on its own. She reports having similar episode twice in the past. She does not have prior history of gout. She reports alcohol use (1 beer daily) and eating red meat regularly. She is on lasix and aspirin 81 mg daily with no recent changes.    She reports having history of lupus however does not follow with rheumatology and would like to be seen by one in [place] (was previously referred to [facility]). She had positive ANA with 1:40 titer on 10/22/08 with repeat on 06/11/09 with titer of 1:80. She had negative recent anti-DS DNA as well as labs for scleroderma and Sjogren's syndrome on 05/09/14. She has never been on treatment in the past. She reports photosensitivity, malar rash, arthralgias of bilateral wrists and right knee, hair loss, and mouth ulcers without cytopenia or renal manifestation. She also has past diagnosis of fibromyalgia.  She reports orthopnea, DOE, bilateral LE edema, and weight gain  (3 lb since February) for the past 4 months. She also has had intermittent palpitations for the past year worse in the past month without chest pain, lightheadedness, or syncope. She has history of grade 1 diastolic dysfunction on last 2D-echo on 05/09/14. TSH on 12/26/14 was normal. She reports adding salt to her food.   She is compliant with taking lasix 20 mg daily, lisinopril 2.5 mg daily, and coreg  6.25 mg BID for hypertension. She has occasional headache but denies blurry vision.     Past Medical History  Diagnosis Date  . Hypertension   . HLD (hyperlipidemia)     Chol = 235, LDL = 156 (08/2010)  . Substance abuse     cocaine   . Lupus 2009    ANA + 10/2008, repeat ANA + (05/2009), on that visit 05/2009 following labs obtained RF <20, CRP <0.4, ANA titers 1:80,   . Pulmonary nodules 05/2008    noted on CXR and CT 05/2008, repeat CT 10/2008 - Stable small bilateral pulmonary nodules measuring up to 6 m m  . Insomnia   . GERD (gastroesophageal reflux disease)   . Fibroids   . History of microcytic hypochromic anemia   . Fibromyalgia   . Myocardial infarction   . CAD (coronary artery disease)     NSTEMI 08/2011:  LHC 08/21/11: mLAD 60-70%, pCFX occluded, dRCA chronic occlusion with L-R collats, EF 40-45%, inf AK.  PCI:  BMS to CFX.  Marland Kitchen Depression   . Shortness of breath 08/22/2013   Current Outpatient Prescriptions  Medication Sig Dispense Refill  . aspirin 81 MG tablet Take 1 tablet (81 mg total) by mouth daily. 30 tablet 11  . atorvastatin (LIPITOR) 80 MG tablet Take 1 tablet (80 mg total) by mouth daily at 6 PM. 30 tablet 2  . carvedilol (COREG) 6.25 MG tablet Take 1 tablet (6.25 mg total) by mouth 2 (two) times daily. 60 tablet 5  . fluticasone (FLONASE) 50  MCG/ACT nasal spray Place 1 spray into both nostrils daily. 9.9 g 0  . furosemide (LASIX) 20 MG tablet Take 1 tablet (20 mg total) by mouth daily. 30 tablet 5  . lisinopril (PRINIVIL,ZESTRIL) 2.5 MG tablet Take 1 tablet (2.5 mg total) by mouth daily. 30 tablet 5  . pantoprazole (PROTONIX) 40 MG tablet Take 1 tablet (40 mg total) by mouth daily. 30 tablet 5  . zolpidem (AMBIEN) 5 MG tablet Take 1 tablet (5 mg total) by mouth at bedtime as needed for sleep. 20 tablet 0   No current facility-administered medications for this visit.   Family History  Problem Relation Age of Onset  . Heart disease Mother   . Cervical  cancer Sister   . Prostate cancer Brother   . Colon cancer Neg Hx    History   Social History  . Marital Status: Divorced    Spouse Name: N/A  . Number of Children: N/A  . Years of Education: N/A   Social History Main Topics  . Smoking status: Current Some Day Smoker -- 0.30 packs/day    Types: Cigarettes  . Smokeless tobacco: Not on file     Comment: 1-2 cigs/week  . Alcohol Use: 4.2 oz/week    7 Standard drinks or equivalent per week     Comment: beer 1-2 per day  . Drug Use: No     Comment: last used cocaine less than a month ago  . Sexual Activity: Not Currently   Other Topics Concern  . None   Social History Narrative   Review of Systems: Review of Systems  Constitutional: Positive for malaise/fatigue. Negative for fever and chills.       Weight gain  Eyes: Negative for blurred vision.  Respiratory: Positive for cough (dry) and shortness of breath (with exertion). Negative for sputum production and wheezing.   Cardiovascular: Positive for palpitations (intermitent for past year ) and leg swelling (b/l). Negative for chest pain.  Gastrointestinal: Positive for nausea, vomiting, abdominal pain and diarrhea (loose stools). Negative for constipation.  Genitourinary: Negative for dysuria, urgency and frequency.  Musculoskeletal: Positive for joint pain (b/l wrists and right knee).  Skin: Positive for rash (face).  Neurological: Positive for headaches (intermittent).     Objective:  Physical Exam: Filed Vitals:   05/23/15 0926  BP: 161/90  Pulse: 73  Temp: 97.8 F (36.6 C)  TempSrc: Oral  Height: _0  (1.575 m)  Weight: 205 lb 11.2 oz (93.305 kg)  SpO2: 100%   Physical Exam  Constitutional: She is oriented to person, place, and time. She appears well-developed and well-nourished. No distress.  HENT:  Head: Normocephalic and atraumatic.  Right Ear: External ear normal.  Left Ear: External ear normal.  Nose: Nose normal.  Mouth/Throat: Oropharynx is clear  and moist. No oropharyngeal exudate.  No mouth ulcers  Eyes: Conjunctivae and EOM are normal. Pupils are equal, round, and reactive to light. Right eye exhibits no discharge. Left eye exhibits no discharge. No scleral icterus.  Neck: Normal range of motion. Neck supple.  Cardiovascular: Normal rate, regular rhythm and normal heart sounds.   Pulmonary/Chest: Effort normal. No respiratory distress. She has no wheezes. She has no rales.  Decreased breath sounds at bases  Abdominal: Soft. Bowel sounds are normal. She exhibits no distension. There is no tenderness. There is no rebound and no guarding.  Musculoskeletal: Normal range of motion. She exhibits edema (trace b/l LE, mild of right great toe). She exhibits no tenderness.  Neurological:  She is alert and oriented to person, place, and time.  Skin: Skin is warm and dry. Rash (mild facial rash sparing nasolabial folds) noted. She is not diaphoretic. No erythema. No pallor.  Psychiatric: Her behavior is normal. Judgment and thought content normal.  Tearful during interview and examination    Assessment & Plan:   Please see problem list for problem-based assessment and plan

## 2015-05-23 NOTE — Patient Instructions (Signed)
-  Start taking lasix 40 mg daily instead of 20 mg daily  -Will refer you to a rheumatalogist in [place] -Will check your labs next time -Please read the information about gout below -Please come back in 1 week, very nice meeting you!  Gout Gout is when your joints become red, sore, and swell (inflamed). This is caused by the buildup of uric acid crystals in the joints. Uric acid is a chemical that is normally in the blood. If the level of uric acid gets too high in the blood, these crystals form in your joints and tissues. Over time, these crystals can form into masses near the joints and tissues. These masses can destroy bone and cause the bone to look misshapen (deformed). HOME CARE   Do not take aspirin for pain.  Only take medicine as told by your doctor.  Rest the joint as much as you can. When in bed, keep sheets and blankets off painful areas.  Keep the sore joints raised (elevated).  Put warm or cold packs on painful joints. Use of warm or cold packs depends on which works best for you.  Use crutches if the painful joint is in your leg.  Drink enough fluids to keep your pee (urine) clear or pale yellow. Limit alcohol, sugary drinks, and drinks with fructose in them.  Follow your diet instructions. Pay careful attention to how much protein you eat. Include fruits, vegetables, whole grains, and fat-free or low-fat milk products in your daily diet. Talk to your doctor or dietitian about the use of coffee, vitamin C, and cherries. These may help lower uric acid levels.  Keep a healthy body weight. GET HELP RIGHT AWAY IF:   You have watery poop (diarrhea), throw up (vomit), or have any side effects from medicines.  You do not feel better in 24 hours, or you are getting worse.  Your joint becomes suddenly more tender, and you have chills or a fever. MAKE SURE YOU:   Understand these instructions.  Will watch your condition.  Will get help right away if you are not doing  well or get worse. Document Released: 08/11/2008 Document Revised: 03/19/2014 Document Reviewed: 06/15/2012 [facility] Patient Information 2015 [place], [place]. This information is not intended to replace advice given to you by your health care provider. Make sure you discuss any questions you have with your health care provider.   General Instructions:   Please bring your medicines with you each time you come to clinic.  Medicines may include prescription medications, over-the-counter medications, herbal remedies, eye drops, vitamins, or other pills.   Progress Toward Treatment Goals:  Treatment Goal 12/25/2013  Blood pressure deteriorated    Self Care Goals & Plans:  Self Care Goal 05/23/2015  Manage my medications take my medicines as prescribed; bring my medications to every visit; refill my medications on time; follow the sick day instructions if I am sick  Monitor my health -  Eat healthy foods eat baked foods instead of fried foods; eat fruit for snacks and desserts; eat more vegetables  Be physically active find an activity I enjoy  Stop smoking -  Prevent falls -    No flowsheet data found.   Care Management & Community Referrals:  No flowsheet data found.

## 2015-05-25 DIAGNOSIS — M79673 Pain in unspecified foot: Secondary | ICD-10-CM | POA: Insufficient documentation

## 2015-05-25 NOTE — Assessment & Plan Note (Signed)
Assessment: Pt with 3rd episode of right great toe pain,swelling, warmth, and erythema one week ago most likely due to acute gout in setting of dietary indiscretion who presents with resolved symptoms.   Plan: -Monitor for reoccurrence -Pt instructed to limit alcohol and red meat intake -Obtain uric acid level at next visit (declined at this time b/c she was in a hurry to leave)

## 2015-05-25 NOTE — Assessment & Plan Note (Signed)
Assessment: Pt with reported history of SLE not on medical therapy who presents with no recent flare.  Plan:  -Refer to rheumatology in [place] per pt preference  -Consider checking ANA, anti-smith, anti-DS DNA, CRP, ESR, C3, C4, and anti-phospholipid at next visit (declined at this time b/c she was in a hurry to leave)

## 2015-05-25 NOTE — Assessment & Plan Note (Addendum)
Assessment: Pt with moderate to poorly controlled hypertension compliant with three-class (BB, ACEi, & diuretic) anti-hypertensive therapy who presents with blood pressure of 161/90.   Plan: -BP 161/90 not at goal <140/90 -Increase lasix from 20 mg daily to 40 mg daily in setting of acute on chronic CHF exacerbation -Continue carvedilol 6.25 mg BID and lisinopril 2.5 mg daily  -Last CMP on 12/25/14 was normal, obtain BMP at next visit

## 2015-05-25 NOTE — Assessment & Plan Note (Signed)
Assessment: Pt with chronic grade 1 diastolic CHF with last 2D-echo on 05/09/14 compliant with medical therapy who presents with mild exacerbation in setting of probable dietary indiscretion.   Plan: -SpO2 100% on RA -Wt 205 lb which is up 1 lb from last visit in May  -Increase lasix from 20 mg daily to 40 mg daily -Continue carvedilol 6.25 mg BID and lisinopril 2.5 mg daily  -Pt to return in 1 week

## 2015-05-28 NOTE — Progress Notes (Signed)
Internal Medicine Clinic Attending  Case discussed with Dr. Naaman Plummer soon after the resident saw the patient.  We reviewed the resident's history and exam and pertinent patient test results.  I agree with the assessment, diagnosis, and plan of care documented in the resident's note.

## 2015-05-30 ENCOUNTER — Ambulatory Visit (INDEPENDENT_AMBULATORY_CARE_PROVIDER_SITE_OTHER): Payer: Medicare Other | Admitting: Internal Medicine

## 2015-05-30 VITALS — BP 163/90 | HR 71 | Temp 98.1°F | Ht 62.0 in | Wt 208.5 lb

## 2015-05-30 DIAGNOSIS — M797 Fibromyalgia: Secondary | ICD-10-CM | POA: Diagnosis not present

## 2015-05-30 DIAGNOSIS — F418 Other specified anxiety disorders: Secondary | ICD-10-CM

## 2015-05-30 DIAGNOSIS — I5032 Chronic diastolic (congestive) heart failure: Secondary | ICD-10-CM | POA: Diagnosis present

## 2015-05-30 DIAGNOSIS — M79674 Pain in right toe(s): Secondary | ICD-10-CM | POA: Diagnosis not present

## 2015-05-30 DIAGNOSIS — M79605 Pain in left leg: Secondary | ICD-10-CM | POA: Diagnosis not present

## 2015-05-30 DIAGNOSIS — M79604 Pain in right leg: Secondary | ICD-10-CM

## 2015-05-30 DIAGNOSIS — E559 Vitamin D deficiency, unspecified: Secondary | ICD-10-CM | POA: Diagnosis not present

## 2015-05-30 DIAGNOSIS — G8929 Other chronic pain: Secondary | ICD-10-CM | POA: Diagnosis not present

## 2015-05-30 DIAGNOSIS — F419 Anxiety disorder, unspecified: Secondary | ICD-10-CM

## 2015-05-30 DIAGNOSIS — Z418 Encounter for other procedures for purposes other than remedying health state: Secondary | ICD-10-CM | POA: Diagnosis not present

## 2015-05-30 DIAGNOSIS — Z7982 Long term (current) use of aspirin: Secondary | ICD-10-CM

## 2015-05-30 DIAGNOSIS — M329 Systemic lupus erythematosus, unspecified: Secondary | ICD-10-CM | POA: Diagnosis not present

## 2015-05-30 DIAGNOSIS — I10 Essential (primary) hypertension: Secondary | ICD-10-CM

## 2015-05-30 DIAGNOSIS — F32A Depression, unspecified: Secondary | ICD-10-CM

## 2015-05-30 DIAGNOSIS — M79609 Pain in unspecified limb: Secondary | ICD-10-CM | POA: Diagnosis not present

## 2015-05-30 DIAGNOSIS — Z299 Encounter for prophylactic measures, unspecified: Secondary | ICD-10-CM

## 2015-05-30 DIAGNOSIS — F17210 Nicotine dependence, cigarettes, uncomplicated: Secondary | ICD-10-CM

## 2015-05-30 DIAGNOSIS — F329 Major depressive disorder, single episode, unspecified: Secondary | ICD-10-CM

## 2015-05-30 LAB — COMPLETE METABOLIC PANEL WITH GFR
ALK PHOS: 110 U/L (ref 39–117)
ALT: 16 U/L (ref 0–35)
AST: 16 U/L (ref 0–37)
Albumin: 3.8 g/dL (ref 3.5–5.2)
BILIRUBIN TOTAL: 0.4 mg/dL (ref 0.2–1.2)
BUN: 9 mg/dL (ref 6–23)
CO2: 24 meq/L (ref 19–32)
CREATININE: 0.75 mg/dL (ref 0.50–1.10)
Calcium: 9.1 mg/dL (ref 8.4–10.5)
Chloride: 103 mEq/L (ref 96–112)
GFR, Est African American: >89 mL/min
GFR, Est Non African American: 89 mL/min
GLUCOSE: 81 mg/dL (ref 70–99)
Potassium: 4.4 mEq/L (ref 3.5–5.3)
Sodium: 141 mEq/L (ref 135–145)
TOTAL PROTEIN: 7.1 g/dL (ref 6.0–8.3)

## 2015-05-30 LAB — CBC WITH DIFFERENTIAL/PLATELET
Basophils Absolute: 0 10*3/uL (ref 0.0–0.1)
Basophils Relative: 0 % (ref 0–1)
Eosinophils Absolute: 0.1 10*3/uL (ref 0.0–0.7)
Eosinophils Relative: 2 % (ref 0–5)
HCT: 39.8 % (ref 36.0–46.0)
HEMOGLOBIN: 13 g/dL (ref 12.0–15.0)
Lymphocytes Relative: 35 % (ref 12–46)
Lymphs Abs: 2.1 10*3/uL (ref 0.7–4.0)
MCH: 27.8 pg (ref 26.0–34.0)
MCHC: 32.7 g/dL (ref 30.0–36.0)
MCV: 85 fL (ref 78.0–100.0)
MONO ABS: 0.5 10*3/uL (ref 0.1–1.0)
MPV: 9.3 fL (ref 8.6–12.4)
Monocytes Relative: 8 % (ref 3–12)
NEUTROS ABS: 3.4 10*3/uL (ref 1.7–7.7)
Neutrophils Relative %: 55 % (ref 43–77)
PLATELETS: 226 10*3/uL (ref 150–400)
RBC: 4.68 MIL/uL (ref 3.87–5.11)
RDW: 14.9 % (ref 11.5–15.5)
WBC: 6.1 10*3/uL (ref 4.0–10.5)

## 2015-05-30 LAB — CK: Total CK: 109 U/L (ref 7–177)

## 2015-05-30 LAB — URIC ACID: Uric Acid, Serum: 7.8 mg/dL — ABNORMAL HIGH (ref 2.4–7.0)

## 2015-05-30 LAB — BRAIN NATRIURETIC PEPTIDE: B NATRIURETIC PEPTIDE 5: 155.2 pg/mL — AB (ref 0.0–100.0)

## 2015-05-30 LAB — MAGNESIUM: MAGNESIUM: 2.1 mg/dL (ref 1.5–2.5)

## 2015-05-30 MED ORDER — SERTRALINE HCL 50 MG PO TABS: 50.0000 mg | ORAL_TABLET | Freq: Every day | ORAL | Status: DC

## 2015-05-30 NOTE — Assessment & Plan Note (Signed)
Assessment: Pt with history of anxiety and depression not on medical therapy who presents with depressed mood without SI or concern for bipolar disorder.   Plan: -Prescribe zoloft 50 mg daily  -Pt to return in 4-6 weeks for further adjustment

## 2015-05-30 NOTE — Progress Notes (Signed)
Patient ID: Elizabeth [name], female   DOB: 06/29/1956, 59 y.o.   MRN: [id number]    Subjective:   Patient ID: Elizabeth [name] female   DOB: 03/22/1956 59 y.o.   MRN: [id number]  HPI: [name] is a 59 y.o. pleasant woman with past medical history of possible SLE not on medical therapy, fibromyalgia, hypertension, hyperlipidemia, CAD s/p MI, grade 1 diastolic CHF, depression, TIA, prediabetes, history of cocaine abuse, and GERD who presents for follow-up.   At last visit 1 week ago her lasix was increased from 20 mg daily to 40 mg daily due to acute on chronic diastolic CHF exacerbation. She reports she is still taking 20 mg daily and continues to have orthopnea, DOE, bilateral LE edema, and 3 lb weight gain since last visit 1 week ago. She denies dietary indiscretion. She is compliant with taking lisinopril 2.5 mg daily and coreg 6.25 mg BID. She has occasional headache but denies chest pain, lightheadedness, or blurry vision.   She has bilateral leg pain for years that occurs all day and is worse with walking. She denies myalgias, crawling sensation, or relief with movement. She had left proximal tibial fracture September 2014 s/p ORIF. Doppler US of left lower extremity on 04/09/15 was negative for DVT. She has history of low back pain with xray imaging of lumbar spine on 12/27/07 revealed mild lower lumbar DDD and degenerative facet disease. She denies recent injury, fall, or trauma. She is not taking lipitor that was previously prescribed. She has been taking advil without relief.  She has no longer had right great toe that occurred 1 week ago. Her last uric acid level on 12/26/07 was normal (5.4). She denies prior history of gout.   She reports having history of lupus and is to have rheumatology follow-up soon in [facility]. Per records she was started on plaquenil in 2010 by [facility] for presumed SLE (was not evaluated by rheumatology at that time) that was  discontinued in 2012 for unclear reasons after hospitalization. She had supposed improvement of symptoms with plaquenil. She was also on short term course of prednisone as well. She does not remember being on either of these medications. She reports having fibromyalgia and depression with anxiety. She reports being on wellbutrin and amitriptyline in the past which did not agree with her. She has had depressed mood, loss of interest in activities, fatigue, weight gain, and insomnia for many months. She used to take [place] for insomnia which helped. She denies symptoms of mania or bipolar disorder.     Past Medical History  Diagnosis Date  . Hypertension   . HLD (hyperlipidemia)     Chol = 235, LDL = 156 (08/2010)  . Substance abuse     cocaine   . Lupus 2009    ANA + 10/2008, repeat ANA + (05/2009), on that visit 05/2009 following labs obtained RF <20, CRP <0.4, ANA titers 1:80,   . Pulmonary nodules 05/2008    noted on CXR and CT 05/2008, repeat CT 10/2008 - Stable small bilateral pulmonary nodules measuring up to 6 m m  . Insomnia   . GERD (gastroesophageal reflux disease)   . Fibroids   . History of microcytic hypochromic anemia   . Fibromyalgia   . Myocardial infarction   . CAD (coronary artery disease)     NSTEMI 08/2011:  LHC 08/21/11: mLAD 60-70%, pCFX occluded, dRCA chronic occlusion with L-R collats, EF 40-45%, inf AK.  PCI:  BMS to CFX.  Marland Kitchen  Depression   . Shortness of breath 08/22/2013   Current Outpatient Prescriptions  Medication Sig Dispense Refill  . aspirin 81 MG tablet Take 1 tablet (81 mg total) by mouth daily. 30 tablet 11  . atorvastatin (LIPITOR) 80 MG tablet Take 1 tablet (80 mg total) by mouth daily at 6 PM. 30 tablet 2  . carvedilol (COREG) 6.25 MG tablet Take 1 tablet (6.25 mg total) by mouth 2 (two) times daily. 60 tablet 5  . fluticasone (FLONASE) 50 MCG/ACT nasal spray Place 1 spray into both nostrils daily. 9.9 g 0  . furosemide (LASIX) 20 MG tablet Take 2  tablets (40 mg total) by mouth daily. 30 tablet 5  . lisinopril (PRINIVIL,ZESTRIL) 2.5 MG tablet Take 1 tablet (2.5 mg total) by mouth daily. 30 tablet 5  . pantoprazole (PROTONIX) 40 MG tablet Take 1 tablet (40 mg total) by mouth daily. 30 tablet 5  . zolpidem (AMBIEN) 5 MG tablet Take 1 tablet (5 mg total) by mouth at bedtime as needed for sleep. 20 tablet 0   No current facility-administered medications for this visit.   Family History  Problem Relation Age of Onset  . Heart disease Mother   . Cervical cancer Sister   . Prostate cancer Brother   . Colon cancer Neg Hx    History   Social History  . Marital Status: Divorced    Spouse Name: N/A  . Number of Children: N/A  . Years of Education: N/A   Social History Main Topics  . Smoking status: Current Some Day Smoker -- 0.30 packs/day    Types: Cigarettes  . Smokeless tobacco: Not on file     Comment: 1-2 cigs/week  . Alcohol Use: 4.2 oz/week    7 Standard drinks or equivalent per week     Comment: beer 1-2 per day  . Drug Use: No     Comment: last used cocaine less than a month ago  . Sexual Activity: Not Currently   Other Topics Concern  . Not on file   Social History Narrative   Review of Systems: Review of Systems  Constitutional: Positive for malaise/fatigue (chronic). Negative for fever and chills.       Weight gain  Respiratory: Positive for cough.   Cardiovascular: Positive for orthopnea and leg swelling (b/l LE). Negative for chest pain.  Gastrointestinal: Positive for abdominal pain (occasionally), diarrhea (occasionally) and constipation (ocasionally).  Genitourinary: Negative for dysuria, urgency and frequency.  Musculoskeletal: Positive for joint pain. Negative for myalgias and falls.       B/l LE pain for years  Skin: Positive for rash (facial ).       Discoloration of chin  Neurological: Positive for headaches. Negative for dizziness.  Psychiatric/Behavioral: Positive for depression. The patient is  nervous/anxious and has insomnia.      Objective:  Physical Exam: Filed Vitals:   05/30/15 0926  BP: 163/90  Pulse: 71  Temp: 98.1 F (36.7 C)  TempSrc: Oral  Height: _0  (1.575 m)  Weight: 208 lb 8 oz (94.575 kg)  SpO2: 98%    Physical Exam  Constitutional: She is oriented to person, place, and time. She appears well-developed and well-nourished. No distress.  HENT:  Head: Normocephalic and atraumatic.  Eyes: EOM are normal.  Neck: Normal range of motion. Neck supple.  Cardiovascular: Normal rate, regular rhythm and normal heart sounds.   No murmur heard. Pulmonary/Chest: Effort normal. No respiratory distress. She has no wheezes. She has no rales.  Decreased  BS at bases   Abdominal: Soft. Bowel sounds are normal. She exhibits no distension. There is no tenderness. There is no rebound and no guarding.  Musculoskeletal: Normal range of motion. She exhibits edema (trace b/l LE) and tenderness (mild of b/l LE).  Neurological: She is alert and oriented to person, place, and time.  Skin: Skin is warm and dry. No rash noted. She is not diaphoretic. No erythema. No pallor.  Discoloration of chin  Psychiatric: Her behavior is normal. Judgment and thought content normal.  Tearful during interview    Assessment & Plan:   Please see problem list for problem-based assessment and plan

## 2015-05-30 NOTE — Assessment & Plan Note (Signed)
Assessment: Pt with chronic grade 1 diastolic CHF with last 2D-echo on 05/09/14 compliant with medical therapy who presents with mild exacerbation.   Plan: -SpO2 98% on RA -Wt up 3 lb from last visit 1 week ago  -Obtain BNP level ---> mildly increased at 155.2 -Pt instructed to increase lasix from 20 mg daily to 40 mg daily which she did not do at last visit  -Continue carvedilol 6.25 mg BID and lisinopril 2.5 mg daily  -Pt to return in 2-4 weeks

## 2015-05-30 NOTE — Assessment & Plan Note (Signed)
-  Obtain screening HIV and HCV Ab (born b/w 1944-65) -Pt instructed to make appointment for pap smear testing

## 2015-05-30 NOTE — Patient Instructions (Signed)
-  Start taking zoloft 50 mg daily to help with mood, I sent this in to your pharmacy  -Make sure you start taking lasix 40 mg daily instead of 20 mg daily to help with excess fluid and blood pressure  -Will check your bloodwork today and call you with the results -They will call you for your rheumatology appointment -Please come back in 2-4 weeks for follow-up, nice seeing you again!   General Instructions:   Please bring your medicines with you each time you come to clinic.  Medicines may include prescription medications, over-the-counter medications, herbal remedies, eye drops, vitamins, or other pills.   Progress Toward Treatment Goals:  Treatment Goal 12/25/2013  Blood pressure deteriorated    Self Care Goals & Plans:  Self Care Goal 05/30/2015  Manage my medications take my medicines as prescribed; bring my medications to every visit; follow the sick day instructions if I am sick; refill my medications on time  Monitor my health -  Eat healthy foods eat more vegetables; eat fruit for snacks and desserts; eat baked foods instead of fried foods; eat smaller portions  Be physically active find an activity I enjoy  Stop smoking -  Prevent falls -    No flowsheet data found.   Care Management & Community Referrals:  No flowsheet data found.

## 2015-05-30 NOTE — Assessment & Plan Note (Addendum)
Assessment: Pt with history of possible SLE and fibromyalgia with chronic bilateral lower extremity pain of unclear etiology who presents with pain that is at baseline.    Plan:  -Obtain CMP, CBC w/diff, CK, magnesium, and 25-OH vitamin D level  -Last TSH on 12/26/14 was normal -Left LE Doppler US on 04/09/15 was negative for DVT  -Pt awaiting rheumatology referral in [facility] -Consider ABI testing in setting of tobacco use to assess for PVD -Consider lumbar spine imaging to assess for neurogenic claudication -Pt instructed to take OTC NSAID's PRN pain, consider tramadol if continues to be uncontrolled    ADDENDUM on 05/31/15: Pt with vitamin D deficiency with level of 7. Pt instructed to start ergocalciferol 50K U weekly for 8 weeks with repeat levels after completion of therapy.

## 2015-05-30 NOTE — Assessment & Plan Note (Signed)
Assessment: Pt with moderate to poorly controlled hypertension compliant with three-class (BB, ACEi, & diuretic) anti-hypertensive therapy who presents with blood pressure of 163/90.   Plan: -BP 163/90 not at goal <140/90 -Pt instructed to increase lasix from 20 mg daily to 40 mg daily in setting of acute on chronic CHF exacerbation which she did not do at last visit -Continue carvedilol 6.25 mg BID and lisinopril 2.5 mg daily  -Obtain CMP

## 2015-05-30 NOTE — Assessment & Plan Note (Addendum)
Assessment: Pt with 3rd episode of right great toe pain, swelling, warmth, and erythema two weeks ago most likely due to acute gout in setting of dietary indiscretion who presents with no recurrent symptoms.   Plan: -Obtain uric acid level  -Pt instructed to limit alcohol and red meat intake

## 2015-05-31 DIAGNOSIS — E559 Vitamin D deficiency, unspecified: Secondary | ICD-10-CM | POA: Insufficient documentation

## 2015-05-31 LAB — HIV ANTIBODY (ROUTINE TESTING W REFLEX): HIV 1&2 Ab, 4th Generation: NONREACTIVE

## 2015-05-31 LAB — VITAMIN D 25 HYDROXY (VIT D DEFICIENCY, FRACTURES): Vit D, 25-Hydroxy: 7 ng/mL — ABNORMAL LOW (ref 30–100)

## 2015-05-31 LAB — HEPATITIS C ANTIBODY: HCV Ab: NEGATIVE

## 2015-05-31 NOTE — Assessment & Plan Note (Signed)
Assessment: Pt with vitamin D deficiency with level of 7 on 05/30/15 who presents with no recent fall or fracture.  Plan: -Prescribe ergocalciferol 50K U weekly for 8 weeks  -Repeat 25-OH vitamin D level after completion of therapy

## 2015-06-03 NOTE — Progress Notes (Signed)
Internal Medicine Clinic Attending  Case discussed with Dr. Naaman Plummer soon after the resident saw the patient.  We reviewed the resident's history and exam and pertinent patient test results.  I agree with the assessment, diagnosis, and plan of care documented in the resident's note.

## 2015-06-14 ENCOUNTER — Encounter: Payer: Self-pay | Admitting: Internal Medicine

## 2015-06-14 DIAGNOSIS — I700 Atherosclerosis of aorta: Secondary | ICD-10-CM | POA: Insufficient documentation

## 2015-07-01 ENCOUNTER — Telehealth: Payer: Self-pay | Admitting: *Deleted

## 2015-07-01 NOTE — Telephone Encounter (Signed)
RETURNED CALL TO PATIENT, REQUESTING ORTHOPEDIC REFERRAL TO [facility]. STATES SHE IS HAVING PAIN IN KNEE/ PRIOR SURGERY ABOUT 2 YEARS AGO.. IN BASKET TO DR Ellwood Dense.

## 2015-07-03 ENCOUNTER — Telehealth: Payer: Self-pay | Admitting: *Deleted

## 2015-07-03 NOTE — Telephone Encounter (Signed)
CALLED PATIENT , LVM FOR PATIENT REGARDING HER ORTHOPEDIC REFERRAL.  PER DR BHARDWAJ, PATIENT HAS APPOINTMENT 8/25 WITH HER. SHE WILL ADDRESS THE ORTHOPEDIC ISSUE WITH HER AT THAT TIME.

## 2015-07-08 DIAGNOSIS — H4011X2 Primary open-angle glaucoma, moderate stage: Secondary | ICD-10-CM | POA: Diagnosis not present

## 2015-07-08 DIAGNOSIS — H5203 Hypermetropia, bilateral: Secondary | ICD-10-CM | POA: Diagnosis not present

## 2015-07-09 ENCOUNTER — Other Ambulatory Visit: Payer: Self-pay

## 2015-07-09 DIAGNOSIS — Z1231 Encounter for screening mammogram for malignant neoplasm of breast: Secondary | ICD-10-CM

## 2015-07-11 ENCOUNTER — Ambulatory Visit (INDEPENDENT_AMBULATORY_CARE_PROVIDER_SITE_OTHER): Payer: Medicare Other | Admitting: Internal Medicine

## 2015-07-11 ENCOUNTER — Encounter: Payer: Self-pay | Admitting: Internal Medicine

## 2015-07-11 VITALS — BP 168/89 | HR 72 | Temp 98.2°F | Wt 205.7 lb

## 2015-07-11 DIAGNOSIS — M6530 Trigger finger, unspecified finger: Secondary | ICD-10-CM

## 2015-07-11 DIAGNOSIS — M65312 Trigger thumb, left thumb: Secondary | ICD-10-CM | POA: Diagnosis not present

## 2015-07-11 DIAGNOSIS — M25569 Pain in unspecified knee: Secondary | ICD-10-CM

## 2015-07-11 DIAGNOSIS — Z299 Encounter for prophylactic measures, unspecified: Secondary | ICD-10-CM

## 2015-07-11 DIAGNOSIS — M25562 Pain in left knee: Secondary | ICD-10-CM

## 2015-07-11 DIAGNOSIS — G8929 Other chronic pain: Secondary | ICD-10-CM | POA: Insufficient documentation

## 2015-07-11 DIAGNOSIS — F17210 Nicotine dependence, cigarettes, uncomplicated: Secondary | ICD-10-CM | POA: Diagnosis not present

## 2015-07-11 DIAGNOSIS — I10 Essential (primary) hypertension: Secondary | ICD-10-CM

## 2015-07-11 NOTE — Patient Instructions (Signed)
Elizabeth [name],   Please get left knee Xray done now. Please take your medications like you have been prescribed. You probably have a viral GI infection, which is getting better as you say. Please call us if it does not get better in a few days. Come back in 1 week with all your pill bottles.  [name] MD MPH 07/11/2015 10:22 AM [facility] [address] [place], [place] [address]. Ph: [phone] Hours: 8 am - 5 pm    Diarrhea Diarrhea is watery poop (stool). It can make you feel weak, tired, thirsty, or give you a dry mouth (signs of dehydration). Watery poop is a sign of another problem, most often an infection. It often lasts 2-3 days. It can last longer if it is a sign of something serious. Take care of yourself as told by your doctor. HOME CARE   Drink 1 cup (8 ounces) of fluid each time you have watery poop.  Do not drink the following fluids:  Those that contain simple sugars (fructose, glucose, galactose, lactose, sucrose, maltose).  Sports drinks.  Fruit juices.  Whole milk products.  Sodas.  Drinks with caffeine (coffee, tea, soda) or alcohol.  Oral rehydration solution may be used if the doctor says it is okay. You may make your own solution. Follow this recipe:   - teaspoon table salt.   teaspoon baking soda.   teaspoon salt substitute containing potassium chloride.  1 tablespoons sugar.  1 liter (34 ounces) of water.  Avoid the following foods:  High fiber foods, such as raw fruits and vegetables.  Nuts, seeds, and whole grain breads and cereals.   Those that are sweetened with sugar alcohols (xylitol, sorbitol, mannitol).  Try eating the following foods:  Starchy foods, such as rice, toast, pasta, low-sugar cereal, oatmeal, baked potatoes, crackers, and bagels.  Bananas.  Applesauce.  Eat probiotic-rich foods, such as yogurt and milk products that are fermented.  Wash your hands well after each time you have watery  poop.  Only take medicine as told by your doctor.  Take a warm bath to help lessen burning or pain from having watery poop. GET HELP RIGHT AWAY IF:   You cannot drink fluids without throwing up (vomiting).  You keep throwing up.  You have blood in your poop, or your poop looks black and tarry.  You do not pee (urinate) in 6-8 hours, or there is only a small amount of very dark pee.  You have belly (abdominal) pain that gets worse or stays in the same spot (localizes).  You are weak, dizzy, confused, or light-headed.  You have a very bad headache.  Your watery poop gets worse or does not get better.  You have a fever or lasting symptoms for more than 2-3 days.  You have a fever and your symptoms suddenly get worse. MAKE SURE YOU:   Understand these instructions.  Will watch your condition.  Will get help right away if you are not doing well or get worse. Document Released: 04/20/2008 Document Revised: 03/19/2014 Document Reviewed: 07/10/2012 [facility] Patient Information 2015 [place], [place]. This information is not intended to replace advice given to you by your health care provider. Make sure you discuss any questions you have with your health care provider.

## 2015-07-13 NOTE — Progress Notes (Addendum)
   Subjective:    Patient ID: Elizabeth [name], female    DOB: [birth year], 59 y.o.   MRN: [id number]  HPI [name] is a 59 year old lady who has past medical issues of TIA, chronic diastolic CHF (last ECHO 12/7612 EF 70-92% grade 1 diastolic dysfunction), schemic cardiomyopathy, prediabetes, hypertension, and history of knee surgeries. She is also non-compliant with her medications - she has not been taking any of her medications for the past weeks.   She reports feeling nausea with no vomiting, and has had diarrhea since Monday 8/22. This has been however, progressively getting better. She is able to keep food down and reports eating better now.  She also reports headaches since she stopped taking her blood pressure medicine. Her BP in clinic is 190. She is supposed to take coreg 6.25 twice a day, lasix 40 mg daily and lisinopril 2.6m daily.   She is also on sertraline 568mtablet however she has not been taking it and reports no depression at this time.   She denies chest pain, palpitations, sudden vision problems, shortness of breath, syncope, fever or chills, lightheadedness, pedal edema.   She reports left knee pain, getting worse over time. She has had ORIF left tibia 2 years back following an injury. She wants referral to her orthopedic physician.   She also notes pain in her left thumb which has been catching like a trigger for the past few weeks and would like to go to sports medicine for that.  Smoker 1 cigarette in 2 days - she has stable pulmonary nodules, CT chests done from 2009 to 2013.      Review of Systems  Constitutional: Negative.   HENT: Negative.   Respiratory: Negative.   Cardiovascular: Negative.   Gastrointestinal: Positive for nausea and diarrhea. Negative for vomiting, abdominal pain, constipation, blood in stool, abdominal distention and anal bleeding.  Musculoskeletal: Positive for arthralgias.       Objective:   Physical Exam  Constitutional: She  is oriented to person, place, and time. She appears well-developed and well-nourished. No distress.  HENT:  Head: Normocephalic and atraumatic.  Eyes: Conjunctivae and EOM are normal. Pupils are equal, round, and reactive to light.  Neck: Normal range of motion.  Cardiovascular: Normal rate, regular rhythm, normal heart sounds and intact distal pulses.   No murmur heard. Pulmonary/Chest: Effort normal and breath sounds normal. No respiratory distress. She has no wheezes. She has no rales. She exhibits no tenderness.  Abdominal: Soft. Bowel sounds are normal. She exhibits no distension and no mass. There is no tenderness. There is no rebound and no guarding.  Musculoskeletal: She exhibits no edema.  Neurological: She is alert and oriented to person, place, and time.  Skin: Skin is warm. She is not diaphoretic.      Assessment & Plan:   Please see problem based charting. For GI upset, symptoms are consistent with likely viral gastroenteritis which is already getting better. Instructed patient to keep hydrated, and supportive care, light diet. If symptoms grow worse, she will seek medical care at clinic or urgent care.

## 2015-07-13 NOTE — Assessment & Plan Note (Signed)
Left thumb - discussed with patient that she might be able to get intraarticular injection at orthopedic clinic and so she may not need sports medicine referral. She understands.

## 2015-07-13 NOTE — Assessment & Plan Note (Signed)
Since the CT of her left knee in 2014, the patient has had no imaging. Patient refused Xray knee ordered for her today.  Referral to orthopedic surgery given.

## 2015-07-13 NOTE — Assessment & Plan Note (Signed)
Extremely elevated blood pressure likely due to non-compliance. Patient denies chest pain, palpitation, CHF or stroke like symptoms.  Patient agrees to start medications asap. She does not even know what medications she is on.  Will come back in 1 week for reassessment after starting medications. Instructed to bring all her pill bottles next week.

## 2015-07-13 NOTE — Assessment & Plan Note (Signed)
We will give her the flu shot next week, as she does not seem to be feeling well today.

## 2015-07-17 ENCOUNTER — Ambulatory Visit: Payer: Medicare Other

## 2015-07-19 ENCOUNTER — Telehealth: Payer: Self-pay | Admitting: *Deleted

## 2015-07-19 NOTE — Telephone Encounter (Signed)
PATIENT WAS CONTACTED WITH ORTHO APPOINTMENT.  9-7-016 TO ARRIVE 10:15AM FOR A 10:45AM APPOINTMENT. PATIENT WAS INSTRUCTED TO CALL CASE WORKER TO CORRECT MEDICAID CARD ([facility] SHOWING IN THE SYSTEM PER ORTHO OFFICE ). HER MEDICAID CARD THAT IS SCANNED IN THE SYSTEM IS BLANK.

## 2015-07-24 DIAGNOSIS — M65312 Trigger thumb, left thumb: Secondary | ICD-10-CM | POA: Diagnosis not present

## 2015-08-05 ENCOUNTER — Ambulatory Visit
Admission: RE | Admit: 2015-08-05 | Discharge: 2015-08-05 | Disposition: A | Discharge status: Home / Self Care | Payer: Medicare Other | Source: Ambulatory Visit

## 2015-08-05 DIAGNOSIS — Z1231 Encounter for screening mammogram for malignant neoplasm of breast: Secondary | ICD-10-CM

## 2015-08-12 DIAGNOSIS — H4011X2 Primary open-angle glaucoma, moderate stage: Secondary | ICD-10-CM | POA: Diagnosis not present

## 2015-08-14 ENCOUNTER — Encounter: Payer: Self-pay | Admitting: Internal Medicine

## 2015-08-14 ENCOUNTER — Ambulatory Visit (HOSPITAL_COMMUNITY)
Admission: RE | Admit: 2015-08-14 | Discharge: 2015-08-14 | Disposition: A | Discharge status: Home / Self Care | Payer: Medicare Other | Source: Ambulatory Visit | Attending: Internal Medicine | Admitting: Internal Medicine

## 2015-08-14 ENCOUNTER — Ambulatory Visit (INDEPENDENT_AMBULATORY_CARE_PROVIDER_SITE_OTHER): Payer: Medicare Other | Admitting: Internal Medicine

## 2015-08-14 VITALS — BP 160/97 | HR 77 | Temp 97.9°F | Ht 62.0 in | Wt 207.4 lb

## 2015-08-14 DIAGNOSIS — G459 Transient cerebral ischemic attack, unspecified: Secondary | ICD-10-CM

## 2015-08-14 DIAGNOSIS — R079 Chest pain, unspecified: Secondary | ICD-10-CM | POA: Diagnosis not present

## 2015-08-14 DIAGNOSIS — R0789 Other chest pain: Secondary | ICD-10-CM

## 2015-08-14 DIAGNOSIS — F17210 Nicotine dependence, cigarettes, uncomplicated: Secondary | ICD-10-CM | POA: Diagnosis not present

## 2015-08-14 LAB — TROPONIN I: Troponin I: <0.03 ng/mL (ref <0.031)

## 2015-08-14 MED ORDER — ASPIRIN 81 MG PO TABS: 81.0000 mg | ORAL_TABLET | Freq: Every day | ORAL | Status: DC

## 2015-08-14 NOTE — Progress Notes (Signed)
Patient ID: Elizabeth [name], female   DOB: 14-Oct-1956, 59 y.o.   MRN: [id number]   Subjective:   Patient ID: Elizabeth [name] female   DOB: Jun 08, 1956 59 y.o.   MRN: [id number]  HPI: [name] is a 59 y.o. with PMH listed below, presented with c/o chest pains that started last night, she was trying to go to sleep, lasted till this morning, and just stopped, and is worse when she leans over, and also says her left hand is numb and is still numb. PAin was rated 20/10, now a 5/10. She has had prior MI, with one in 2012 where she had a BMS placed. She takes aspirin everyday, but didn't hasnt taken it for the past 2 days. Pain radiated to her back, arm, but not her jaw. She feels like it is similar to her prior heart attacks. She endorses having chronic SOB- for a few weeks, she poor sleep due to SOB, she she cannot lie down to sleep, also endorses dyspnea with exertion. No diaphoresis or nausea with chest pain, no palpitations. She denies Cocaine uses in at least the la over 6 months.  She also has a cough, which has been going on about 1 week, not productive. No sick contacts. Says he does not feel good right now. No fever.  She says he is taking a cholesterol med, but none noted on her list. She has a hx of not been complaint with her meds. She took her BP meds yesterday.    She also complains of chronic Knee pain present since she had an knee fracture- left with Orif, last xray- 2014, stable, post- ORIF.   Past Medical History  Diagnosis Date  . Hypertension   . HLD (hyperlipidemia)     Chol = 235, LDL = 156 (08/2010)  . Substance abuse     cocaine   . Lupus 2009    ANA + 10/2008, repeat ANA + (05/2009), on that visit 05/2009 following labs obtained RF <20, CRP <0.4, ANA titers 1:80,   . Pulmonary nodules 05/2008    noted on CXR and CT 05/2008, repeat CT 10/2008 - Stable small bilateral pulmonary nodules measuring up to 6 m m  . Insomnia   . GERD (gastroesophageal reflux  disease)   . Fibroids   . History of microcytic hypochromic anemia   . Fibromyalgia   . Myocardial infarction   . CAD (coronary artery disease)     NSTEMI 08/2011:  LHC 08/21/11: mLAD 60-70%, pCFX occluded, dRCA chronic occlusion with L-R collats, EF 40-45%, inf AK.  PCI:  BMS to CFX.  Marland Kitchen Depression   . Shortness of breath 08/22/2013   Current Outpatient Prescriptions  Medication Sig Dispense Refill  . aspirin 81 MG tablet Take 1 tablet (81 mg total) by mouth daily. (Patient not taking: Reported on 07/11/2015) 30 tablet 11  . carvedilol (COREG) 6.25 MG tablet Take 1 tablet (6.25 mg total) by mouth 2 (two) times daily. (Patient not taking: Reported on 07/11/2015) 60 tablet 5  . fluticasone (FLONASE) 50 MCG/ACT nasal spray Place 1 spray into both nostrils daily. 9.9 g 0  . furosemide (LASIX) 20 MG tablet Take 2 tablets (40 mg total) by mouth daily. (Patient not taking: Reported on 07/11/2015) 30 tablet 5  . lisinopril (PRINIVIL,ZESTRIL) 2.5 MG tablet Take 1 tablet (2.5 mg total) by mouth daily. (Patient not taking: Reported on 07/11/2015) 30 tablet 5  . pantoprazole (PROTONIX) 40 MG tablet Take 1 tablet (40 mg total)  by mouth daily. (Patient not taking: Reported on 07/11/2015) 30 tablet 5  . sertraline (ZOLOFT) 50 MG tablet Take 1 tablet (50 mg total) by mouth daily. (Patient not taking: Reported on 07/11/2015) 30 tablet 2   No current facility-administered medications for this visit.   Family History  Problem Relation Age of Onset  . Heart disease Mother   . Cervical cancer Sister   . Prostate cancer Brother   . Colon cancer Neg Hx    Social History   Social History  . Marital Status: Divorced    Spouse Name: N/A  . Number of Children: N/A  . Years of Education: N/A   Social History Main Topics  . Smoking status: Current Some Day Smoker -- 0.30 packs/day    Types: Cigarettes  . Smokeless tobacco: Not on file     Comment: 1-2 cigs/week  . Alcohol Use: 4.2 oz/week    7 Standard drinks  or equivalent per week     Comment: beer 1-2 per day  . Drug Use: No     Comment: last used cocaine less than a month ago  . Sexual Activity: Not Currently   Other Topics Concern  . Not on file   Social History Narrative   Review of Systems: CONSTITUTIONAL- No Fever, weightloss, night sweat SKIN- No Rash, colour changes or itching. HEAD- Headache present , no dizziness. EYES- No Vision loss, pain, redness, double or blurred vision. Mouth/throat- No Sorethroat now, had it previously, or bleeding gums. RESPIRATORY- No Cough or SOB. GI- No nausea, vomiting, diarrhoea,abd pain. URINARY- No Frequency, urgency, straining or dysuria. NEUROLOGIC- No Numbness, syncope, seizures or burning.  Objective:  Physical Exam: Filed Vitals:   08/14/15 0831  BP: 190/84  Pulse: 66  Temp: 97.6 F (36.4 C)  TempSrc: Oral  Height: _0  (1.575 m)  Weight: 207 lb 6.4 oz (94.076 kg)  SpO2: 97%   GENERAL- alert, co-operative, appears as stated age, not in any distress. HEENT- Atraumatic, normocephalic, PERRL, neck supple. CARDIAC- RRR, no murmurs, rubs or gallops. RESP- Moving equal volumes of air, no wheezes or crackles, pt appears slightly SOB. ABDOMEN- Soft, nontender, no palpable masses or organomegaly, bowel sounds present. NEURO- Alert and oriented, strenght upper and lower extremities- 5/5,Gait- Normal. EXTREMITIES- Warm and well perfused, no pedal edema. SKIN- Warm, dry, No rash or lesion. PSYCH- Normal mood and affect, appropriate thought content and speech.  Assessment & Plan:   The patient's case and plan of care was discussed with attending physician, Dr. Lynnae January.  See assessment and plan for details.

## 2015-08-14 NOTE — Patient Instructions (Signed)
We want you to get admitted to the hospital as you could be having a heart attack and This could be fatal if you go home, just as i explained.  Please take your medications, including your aspirin, blood pressure medications and cholesterol medication.   We will send you to the bone doctors.  We will like to see you by Friday/monday, this is important. If you have chest pains again please go to the ED.

## 2015-08-14 NOTE — Assessment & Plan Note (Addendum)
Pt presented today with complaints of unprovoked chest pain radiating down her arm with numbness in her fingers, still present, concerning for ACS, considering her risk factors. She says it felt like her previous heart attack.  Chest pain- Pt with significant risk factors for CAD- Prior MI, PreDM, HTN, dyslipidemia,TIA. With complaints of severe chest pain.  - Stat EKG- No ST or T wave changes, T wave inversions, lateral, inferior and anterior leads- all old. Rate- 61bpm.  - Stat Trop - Will admit patient to IMTS, Observation, Tele, trend Trops, considering significant risk factors. Patient agreed to admission after persuasion and informing her on the risks of going home.  - Also Chest xray, evaluate SOB, BP both arms, for chest pain radiating to back consider Aortic dissection.   - Patient later insisted on going home. Reason- she wants to go get eye drops, later it was something else, she refused to explain. She was extensively counselled and advised to stay, that any med she wanted could be provided here in the hospital. She says if the chest pain gets worse or recurs she will come back, counselled patient she might not have the chance to call for help much less get back to the hospital, as a heart attack to be suddenly fatal. She thanked me for my concern, agreed to a stat trop, to call her if abnormal, and agreed to a stat EKG done before she left the clinic. - 319m of aspirin was given to patient prior to leaving, also given 4 additional pill sof aspirin for the next few days, and called in prescription. - See in clinci in 2 days or Monday. - advised to go to ED if chest pains recur.

## 2015-08-15 NOTE — Progress Notes (Signed)
Internal Medicine Clinic Attending  Case discussed with Dr. Denton Brick soon after the resident saw the patient.  We reviewed the resident's history and exam and pertinent patient test results.  I agree with the assessment, diagnosis, and plan of care documented in the resident's note.

## 2015-08-19 ENCOUNTER — Encounter: Payer: Self-pay | Admitting: Internal Medicine

## 2015-08-19 ENCOUNTER — Ambulatory Visit (INDEPENDENT_AMBULATORY_CARE_PROVIDER_SITE_OTHER): Payer: Medicare Other | Admitting: Internal Medicine

## 2015-08-19 VITALS — BP 137/81 | HR 69 | Temp 97.6°F | Ht 62.0 in | Wt 205.9 lb

## 2015-08-19 DIAGNOSIS — Z23 Encounter for immunization: Secondary | ICD-10-CM

## 2015-08-19 DIAGNOSIS — Z0000 Encounter for general adult medical examination without abnormal findings: Secondary | ICD-10-CM

## 2015-08-19 DIAGNOSIS — Z720 Tobacco use: Secondary | ICD-10-CM

## 2015-08-19 DIAGNOSIS — I255 Ischemic cardiomyopathy: Secondary | ICD-10-CM | POA: Diagnosis not present

## 2015-08-19 DIAGNOSIS — E785 Hyperlipidemia, unspecified: Secondary | ICD-10-CM | POA: Diagnosis not present

## 2015-08-19 DIAGNOSIS — I25119 Atherosclerotic heart disease of native coronary artery with unspecified angina pectoris: Secondary | ICD-10-CM

## 2015-08-19 MED ORDER — ATORVASTATIN CALCIUM 40 MG PO TABS: 40.0000 mg | ORAL_TABLET | Freq: Every day | ORAL | Status: DC

## 2015-08-19 MED ORDER — NICOTINE 14 MG/24HR TD PT24: 14.0000 mg | MEDICATED_PATCH | TRANSDERMAL | Status: DC

## 2015-08-19 NOTE — Assessment & Plan Note (Signed)
Elizabeth [name] reports that she is ready to quit smoking. She says she is down to only one or two cigarettes a day. I explained to her how quitting smoking will decrease her chance of having a heart attack or stroke in the future.  Plan - Given that she is a light smoker, will offer her 14 mg nicotine patch daily. Will consider taper and reassess quit status at follow-up visit.

## 2015-08-19 NOTE — Assessment & Plan Note (Addendum)
Elizabeth [name] reports that Friday morning of last week she had substernal chest pain radiating to her left shoulders, associated with numbness and parasthesias in her left hand. She says it is unlike the acid reflux symptoms she had previously She says that the chest pain has since subsided, only lasting until Friday afternoon, and did not return. At that time there were no acute EKG changes, and a Stat troponin came back negative. She refused being admitted to the hospital, and did not obtain the chest xray that was ordered to evaluate for aortic dissection. Today, there is no chest pain, but she continues to have numbness in her fingertips on the left.- however, this has much improved since Friday.  Plan: - We have ordered a stress test for her to evaluate her cardiac risk - She is agreeable to a high-intensity statin, atorvastatin 40 mg daily - Will obtain baseline lipid panel, look for >50% LDL reduction at follow-up visit - Tobacco cessation counseling

## 2015-08-19 NOTE — Progress Notes (Signed)
   Subjective:    Patient ID: Elizabeth [name], female    DOB: 10-21-1956, 59 y.o.   MRN: [id number]  HPI  Ms. Divelbiss is here follow-up for chest pain. Please see problem-based assessment and plan for details.  Review of Systems  Constitutional: Negative for fever, chills, diaphoresis and unexpected weight change.  HENT: Negative for congestion and sinus pressure.   Eyes: Negative for visual disturbance.  Respiratory: Positive for apnea and shortness of breath. Negative for wheezing.   Cardiovascular: Negative for chest pain, palpitations and leg swelling.  Gastrointestinal: Negative for nausea, vomiting, abdominal pain and abdominal distention.  Endocrine: Negative for cold intolerance.  Genitourinary: Negative for vaginal bleeding and vaginal pain.  Musculoskeletal: Positive for arthralgias.  Skin: Negative for rash.  Neurological: Positive for numbness and headaches. Negative for syncope.  Psychiatric/Behavioral: Negative for suicidal ideas, self-injury and dysphoric mood.       Objective:   Physical Exam  Constitutional: She is oriented to person, place, and time. She appears well-developed.  HENT:  Mouth/Throat: No oropharyngeal exudate.  Eyes: EOM are normal. Pupils are equal, round, and reactive to light.  Neck: No JVD present.  Cardiovascular: Normal rate and regular rhythm.   No murmur heard. Pulmonary/Chest: Effort normal and breath sounds normal. No respiratory distress. She exhibits no tenderness.  Abdominal: Soft. Bowel sounds are normal. She exhibits no distension.  Musculoskeletal: She exhibits no edema or tenderness.  Pain on palpation of left knee. No swelling noted.  Lymphadenopathy:    She has no cervical adenopathy.  Neurological: She is alert and oriented to person, place, and time. No cranial nerve deficit.  Numbness in fingertips on left hand.  Skin: Skin is warm and dry. No rash noted.          Assessment & Plan:  Please see problem-based  assessment and plan for details.

## 2015-08-19 NOTE — Patient Instructions (Signed)
Great to meet you today Ms. Wichman. We are starting you on a new medication for high cholesterol. You will also need a stress test given your recent chest pain. If your chest pain returns, please go to the hospital immediately.      Acute Coronary Syndrome Acute coronary syndrome (ACS) is an urgent problem in which the blood and oxygen supply to the heart is critically deficient. ACS requires hospitalization because one or more coronary arteries may be blocked. ACS represents a range of conditions including:  Previous angina that is now unstable, lasts longer, happens at rest, or is more intense.  A heart attack, with heart muscle cell injury and death. There are three vital coronary arteries that supply the heart muscle with blood and oxygen so that it can pump blood effectively. If blockages to these arteries develop, blood flow to the heart muscle is reduced. If the heart does not get enough blood, angina may occur as the first warning sign. SYMPTOMS   The most common signs of angina include:  Tightness or squeezing in the chest.  Feeling of heaviness on the chest.  Discomfort in the arms, neck, back, or jaw.  Shortness of breath and nausea.  Cold, wet skin.  Angina is usually brought on by physical effort or excitement which increase the oxygen needs of the heart. These states increase the blood flow needs of the heart beyond what can be delivered.  Other symptoms that are not as common include:  Fatigue  Unexplained feelings of nervousness or anxiety  Weakness  Diarrhea  Sometimes, you may not have noticed any symptoms at all but still suffered a cardiac injury. TREATMENT   Medicines to help discomfort may include nitroglycerin (nitro) in the form of tablets or a spray for rapid relief, or longer-acting forms such as cream, patches, or capsules. (Be aware that there are many side effects and possible interactions with other drugs).  Other medicines may be used to help  the heart pump better.  Procedures to open blocked arteries including angioplasty or stent placement to keep the arteries open.  Open heart surgery may be needed when there are many blockages or they are in critical locations that are best treated with surgery. HOME CARE INSTRUCTIONS   Do not use any tobacco products including cigarettes, chewing tobacco, or electronic cigarettes.  Take one baby or adult aspirin daily, if your health care provider advises. This helps reduce the risk of a heart attack.  It is very important that you follow the angina treatment prescribed by your health care provider. Make arrangements for proper follow-up care.  Eat a heart healthy diet with salt and fat restrictions as advised.  Regular exercise is good for you as long as it does not cause discomfort. Do not begin any new type of exercise until you check with your health care provider.  If you are overweight, you should lose weight.  Try to maintain normal blood lipid levels.  Keep your blood pressure under control as recommended by your health care provider.  You should tell your health care provider right away about any increase in the severity or frequency of your chest discomfort or angina attacks. When you have angina, you should stop what you are doing and sit down. This may bring relief in 3 to 5 minutes. If your health care provider has prescribed nitro, take it as directed.  If your health care provider has given you a follow-up appointment, it is very important to keep that  appointment. Not keeping the appointment could result in a chronic or permanent injury, pain, and disability. If there is any problem keeping the appointment, you must call back to this facility for assistance. SEEK IMMEDIATE MEDICAL CARE IF:   You develop nausea, vomiting, or shortness of breath.  You feel faint, lightheaded, or pass out.  Your chest discomfort gets worse.  You are sweating or experience sudden  profound fatigue.  You do not get relief of your chest pain after 3 doses of nitro.  Your discomfort lasts longer than 15 minutes. MAKE SURE YOU:   Understand these instructions.  Will watch your condition.  Will get help right away if you are not doing well or get worse.  Take all medicines as directed by your health care provider. Document Released: 11/02/2005 Document Revised: 11/07/2013 Document Reviewed: 03/06/2014 [facility] Patient Information 2015 [place], [place]. This information is not intended to replace advice given to you by your health care provider. Make sure you discuss any questions you have with your health care provider.

## 2015-08-19 NOTE — Assessment & Plan Note (Signed)
-  Flu shot today 

## 2015-08-20 ENCOUNTER — Telehealth (HOSPITAL_COMMUNITY): Payer: Self-pay | Admitting: *Deleted

## 2015-08-20 LAB — LIPID PANEL
CHOLESTEROL TOTAL: 236 mg/dL — AB (ref 100–199)
Chol/HDL Ratio: 4.8 ratio units — ABNORMAL HIGH (ref 0.0–4.4)
HDL: 49 mg/dL (ref >39)
LDL CALC: 159 mg/dL — AB (ref 0–99)
TRIGLYCERIDES: 140 mg/dL (ref 0–149)
VLDL Cholesterol Cal: 28 mg/dL (ref 5–40)

## 2015-08-20 NOTE — Telephone Encounter (Signed)
Left message on voicemail in reference to upcoming appointment scheduled for 08/22/15. Phone number given for a call back so details instructions can be given. Kian Gamarra, Ranae [place]

## 2015-08-21 ENCOUNTER — Other Ambulatory Visit: Payer: Self-pay | Admitting: *Deleted

## 2015-08-21 ENCOUNTER — Encounter: Payer: Self-pay | Admitting: *Deleted

## 2015-08-21 NOTE — Patient Outreach (Signed)
[place] [facility]) Care Management  08/21/2015  Elizabeth [name] 1956/08/31 [id number]   Referral from [employer], assigned Johny Shock, RN to outreach for [place] Management services.  Thanks, [name]. [place], [place] Assistant Phone: [phone] Fax: [phone]

## 2015-08-21 NOTE — Patient Outreach (Signed)
[place] [facility]) Care Management  08/21/2015  Elizabeth [name] 1956/02/01 [id number]   RN Health Coach telephone to patient. [employer] compliance verified. RN Health Coach spoke with patient and described the services available through [employer] Patient declined service. RN Health Coach will send an outreach packet to patient.  [place] Care Management [phone]

## 2015-08-22 ENCOUNTER — Telehealth (HOSPITAL_COMMUNITY): Payer: Self-pay | Admitting: *Deleted

## 2015-08-22 ENCOUNTER — Encounter (HOSPITAL_COMMUNITY): Payer: Medicare Other

## 2015-08-22 NOTE — Telephone Encounter (Signed)
Patient was a no show for  Myocardial perfusion appointment. Attempted to call patient to reschedule- no answer x 3. Hubbard Robinson, RN

## 2015-08-23 ENCOUNTER — Emergency Department (HOSPITAL_COMMUNITY)
Admission: EM | Admit: 2015-08-23 | Discharge: 2015-08-23 | Disposition: A | Discharge status: Home / Self Care | Payer: Medicare Other | Attending: Emergency Medicine | Admitting: Emergency Medicine

## 2015-08-23 ENCOUNTER — Emergency Department (HOSPITAL_COMMUNITY): Payer: Medicare Other

## 2015-08-23 ENCOUNTER — Encounter (HOSPITAL_COMMUNITY): Payer: Self-pay | Admitting: Emergency Medicine

## 2015-08-23 DIAGNOSIS — R200 Anesthesia of skin: Secondary | ICD-10-CM | POA: Diagnosis not present

## 2015-08-23 DIAGNOSIS — I252 Old myocardial infarction: Secondary | ICD-10-CM | POA: Diagnosis not present

## 2015-08-23 DIAGNOSIS — Z8739 Personal history of other diseases of the musculoskeletal system and connective tissue: Secondary | ICD-10-CM | POA: Insufficient documentation

## 2015-08-23 DIAGNOSIS — Z720 Tobacco use: Secondary | ICD-10-CM | POA: Insufficient documentation

## 2015-08-23 DIAGNOSIS — Z7982 Long term (current) use of aspirin: Secondary | ICD-10-CM | POA: Insufficient documentation

## 2015-08-23 DIAGNOSIS — R1011 Right upper quadrant pain: Secondary | ICD-10-CM | POA: Diagnosis not present

## 2015-08-23 DIAGNOSIS — Z8669 Personal history of other diseases of the nervous system and sense organs: Secondary | ICD-10-CM | POA: Insufficient documentation

## 2015-08-23 DIAGNOSIS — Z862 Personal history of diseases of the blood and blood-forming organs and certain disorders involving the immune mechanism: Secondary | ICD-10-CM | POA: Diagnosis not present

## 2015-08-23 DIAGNOSIS — I10 Essential (primary) hypertension: Secondary | ICD-10-CM | POA: Diagnosis not present

## 2015-08-23 DIAGNOSIS — R1110 Vomiting, unspecified: Secondary | ICD-10-CM | POA: Insufficient documentation

## 2015-08-23 DIAGNOSIS — Z8719 Personal history of other diseases of the digestive system: Secondary | ICD-10-CM | POA: Diagnosis not present

## 2015-08-23 DIAGNOSIS — R110 Nausea: Secondary | ICD-10-CM

## 2015-08-23 DIAGNOSIS — Z9889 Other specified postprocedural states: Secondary | ICD-10-CM | POA: Diagnosis not present

## 2015-08-23 DIAGNOSIS — I2510 Atherosclerotic heart disease of native coronary artery without angina pectoris: Secondary | ICD-10-CM | POA: Insufficient documentation

## 2015-08-23 DIAGNOSIS — Z79899 Other long term (current) drug therapy: Secondary | ICD-10-CM | POA: Insufficient documentation

## 2015-08-23 DIAGNOSIS — Z86018 Personal history of other benign neoplasm: Secondary | ICD-10-CM | POA: Diagnosis not present

## 2015-08-23 DIAGNOSIS — F329 Major depressive disorder, single episode, unspecified: Secondary | ICD-10-CM | POA: Diagnosis not present

## 2015-08-23 DIAGNOSIS — E785 Hyperlipidemia, unspecified: Secondary | ICD-10-CM | POA: Diagnosis not present

## 2015-08-23 DIAGNOSIS — R05 Cough: Secondary | ICD-10-CM | POA: Diagnosis not present

## 2015-08-23 DIAGNOSIS — R112 Nausea with vomiting, unspecified: Secondary | ICD-10-CM | POA: Diagnosis not present

## 2015-08-23 LAB — CBC
HCT: 42.1 % (ref 36.0–46.0)
HEMOGLOBIN: 13.5 g/dL (ref 12.0–15.0)
MCH: 27.9 pg (ref 26.0–34.0)
MCHC: 32.1 g/dL (ref 30.0–36.0)
MCV: 87 fL (ref 78.0–100.0)
Platelets: 189 10*3/uL (ref 150–400)
RBC: 4.84 MIL/uL (ref 3.87–5.11)
RDW: 15.6 % — AB (ref 11.5–15.5)
WBC: 9.3 10*3/uL (ref 4.0–10.5)

## 2015-08-23 LAB — BASIC METABOLIC PANEL
ANION GAP: 12 (ref 5–15)
BUN: 9 mg/dL (ref 6–20)
CALCIUM: 8.9 mg/dL (ref 8.9–10.3)
CO2: 23 mmol/L (ref 22–32)
Chloride: 98 mmol/L — ABNORMAL LOW (ref 101–111)
Creatinine, Ser: 0.89 mg/dL (ref 0.44–1.00)
GFR calc Af Amer: >60 mL/min (ref >60)
GLUCOSE: 106 mg/dL — AB (ref 65–99)
Potassium: 3.8 mmol/L (ref 3.5–5.1)
Sodium: 133 mmol/L — ABNORMAL LOW (ref 135–145)

## 2015-08-23 LAB — I-STAT TROPONIN, ED: TROPONIN I, POC: 0 ng/mL (ref 0.00–0.08)

## 2015-08-23 MED ORDER — ONDANSETRON 4 MG PO TBDP
8.0000 mg | ORAL_TABLET | Freq: Once | ORAL | Status: AC
  Administered 2015-08-23: 8 mg via ORAL
  Filled 2015-08-23: qty 2

## 2015-08-23 MED ORDER — OXYCODONE-ACETAMINOPHEN 5-325 MG PO TABS
1.0000 | ORAL_TABLET | Freq: Once | ORAL | Status: AC
  Administered 2015-08-23: 1 via ORAL
  Filled 2015-08-23: qty 1

## 2015-08-23 NOTE — Discharge Instructions (Signed)
- Call your PCP to reschedule your stress test from 10/6   Abdominal Pain, Adult Many things can cause abdominal pain. Usually, abdominal pain is not caused by a disease and will improve without treatment. It can often be observed and treated at home. Your health care provider will do a physical exam and possibly order blood tests and X-rays to help determine the seriousness of your pain. However, in many cases, more time must pass before a clear cause of the pain can be found. Before that point, your health care provider may not know if you need more testing or further treatment. HOME CARE INSTRUCTIONS Monitor your abdominal pain for any changes. The following actions may help to alleviate any discomfort you are experiencing:  Only take over-the-counter or prescription medicines as directed by your health care provider.  Do not take laxatives unless directed to do so by your health care provider.  Try a clear liquid diet (broth, tea, or water) as directed by your health care provider. Slowly move to a bland diet as tolerated. SEEK MEDICAL CARE IF:  You have unexplained abdominal pain.  You have abdominal pain associated with nausea or diarrhea.  You have pain when you urinate or have a bowel movement.  You experience abdominal pain that wakes you in the night.  You have abdominal pain that is worsened or improved by eating food.  You have abdominal pain that is worsened with eating fatty foods.  You have a fever. SEEK IMMEDIATE MEDICAL CARE IF:  Your pain does not go away within 2 hours.  You keep throwing up (vomiting).  Your pain is felt only in portions of the abdomen, such as the right side or the left lower portion of the abdomen.  You pass bloody or black tarry stools. MAKE SURE YOU:  Understand these instructions.  Will watch your condition.  Will get help right away if you are not doing well or get worse.   This information is not intended to replace advice given  to you by your health care provider. Make sure you discuss any questions you have with your health care provider.   Document Released: 08/12/2005 Document Revised: 07/24/2015 Document Reviewed: 07/12/2013 Elsevier Interactive Patient Education 2016 Elsevier Inc.  Nausea and Vomiting Nausea is a sick feeling that often comes before throwing up (vomiting). Vomiting is a reflex where stomach contents come out of your mouth. Vomiting can cause severe loss of body fluids (dehydration). Children and elderly adults can become dehydrated quickly, especially if they also have diarrhea. Nausea and vomiting are symptoms of a condition or disease. It is important to find the cause of your symptoms. CAUSES   Direct irritation of the stomach lining. This irritation can result from increased acid production (gastroesophageal reflux disease), infection, food poisoning, taking certain medicines (such as nonsteroidal anti-inflammatory drugs), alcohol use, or tobacco use.  Signals from the brain.These signals could be caused by a headache, heat exposure, an inner ear disturbance, increased pressure in the brain from injury, infection, a tumor, or a concussion, pain, emotional stimulus, or metabolic problems.  An obstruction in the gastrointestinal tract (bowel obstruction).  Illnesses such as diabetes, hepatitis, gallbladder problems, appendicitis, kidney problems, cancer, sepsis, atypical symptoms of a heart attack, or eating disorders.  Medical treatments such as chemotherapy and radiation.  Receiving medicine that makes you sleep (general anesthetic) during surgery. DIAGNOSIS Your caregiver may ask for tests to be done if the problems do not improve after a few days. Tests may  also be done if symptoms are severe or if the reason for the nausea and vomiting is not clear. Tests may include:  Urine tests.  Blood tests.  Stool tests.  Cultures (to look for evidence of infection).  X-rays or other  imaging studies. Test results can help your caregiver make decisions about treatment or the need for additional tests. TREATMENT You need to stay well hydrated. Drink frequently but in small amounts.You may wish to drink water, sports drinks, clear broth, or eat frozen ice pops or gelatin dessert to help stay hydrated.When you eat, eating slowly may help prevent nausea.There are also some antinausea medicines that may help prevent nausea. HOME CARE INSTRUCTIONS   Take all medicine as directed by your caregiver.  If you do not have an appetite, do not force yourself to eat. However, you must continue to drink fluids.  If you have an appetite, eat a normal diet unless your caregiver tells you differently.  Eat a variety of complex carbohydrates (rice, wheat, potatoes, bread), lean meats, yogurt, fruits, and vegetables.  Avoid high-fat foods because they are more difficult to digest.  Drink enough water and fluids to keep your urine clear or pale yellow.  If you are dehydrated, ask your caregiver for specific rehydration instructions. Signs of dehydration may include:  Severe thirst.  Dry lips and mouth.  Dizziness.  Dark urine.  Decreasing urine frequency and amount.  Confusion.  Rapid breathing or pulse. SEEK IMMEDIATE MEDICAL CARE IF:   You have blood or brown flecks (like coffee grounds) in your vomit.  You have black or bloody stools.  You have a severe headache or stiff neck.  You are confused.  You have severe abdominal pain.  You have chest pain or trouble breathing.  You do not urinate at least once every 8 hours.  You develop cold or clammy skin.  You continue to vomit for longer than 24 to 48 hours.  You have a fever. MAKE SURE YOU:   Understand these instructions.  Will watch your condition.  Will get help right away if you are not doing well or get worse.   This information is not intended to replace advice given to you by your health care  provider. Make sure you discuss any questions you have with your health care provider.   Document Released: 11/02/2005 Document Revised: 01/25/2012 Document Reviewed: 04/01/2011 Elsevier Interactive Patient Education [employer].

## 2015-08-23 NOTE — ED Provider Notes (Signed)
CSN: [id number]     Arrival date & time 08/23/15  5456 History   First MD Initiated Contact with Patient 08/23/15 [phone]     Chief Complaint  Patient presents with  . Shortness of Breath  . Numbness  . Abdominal Pain   HPI  Ms. Croson is a 59 year old female with PMHx of HTN, HLD, CAD presenting with abdominal pain and finger numbness. Pt reports onset of RUQ abdominal pain approximately 12 hours ago after she ate dinner. She reports it is sharp, stabbing pain. The pain has been constant since last evening and is associated with nausea and 3 episodes of NBNB vomiting. She denies aggravating factors. She has not tried any OTC medicines for symptom relief. She is also complaining of right 2nd digit and left 1st-5th finger numbness. She states that this began over a week ago and has been intermittent since. She denies weakness or color changes of the arms and hands. She was seen by her PCP on 9/28 for chest pain and same numbness in the fingers. At that time, she had unchanged ECG and negative troponin. She was encouraged to come into the hospital for chest pain r/o but declined. She followed up with her PCP on 10/4 and was scheduled for an outpatient stress test. She did not show up for her stress test yesterday and would not disclose why she missed the appointment. She denies current chest pain, shortness of breath or chest tightness, she states "my doc told me I seemed short of breath so I just figured I was but I feel okay". Denies fevers, chills, headaches, dizziness, chest pain, palpitations, cough, diarrhea, dysuria, flank pain or back pain.   Past Medical History  Diagnosis Date  . Hypertension   . HLD (hyperlipidemia)     Chol = 235, LDL = 156 (08/2010)  . Substance abuse     cocaine   . Lupus ([place]) 2009    ANA + 10/2008, repeat ANA + (05/2009), on that visit 05/2009 following labs obtained RF <20, CRP <0.4, ANA titers 1:80,   . Pulmonary nodules 05/2008    noted on CXR and CT 05/2008,  repeat CT 10/2008 - Stable small bilateral pulmonary nodules measuring up to 6 m m  . Insomnia   . GERD (gastroesophageal reflux disease)   . Fibroids   . History of microcytic hypochromic anemia   . Fibromyalgia   . Myocardial infarction ([place])   . CAD (coronary artery disease)     NSTEMI 08/2011:  LHC 08/21/11: mLAD 60-70%, pCFX occluded, dRCA chronic occlusion with L-R collats, EF 40-45%, inf AK.  PCI:  BMS to CFX.  Marland Kitchen Depression   . Shortness of breath 08/22/2013   Past Surgical History  Procedure Laterality Date  . Abdominal hysterectomy  05/2003  . Colonoscopy N/A 07/14/2013    Procedure: COLONOSCOPY;  Surgeon: Beryle Beams, MD;  Location: [facility];  Service: Endoscopy;  Laterality: N/A;  . Stent on chest    . Cardiac catheterization    . Orif tibia plateau Left 08/22/2013    Procedure: OPEN REDUCTION INTERNAL FIXATION (ORIF) TIBIAL PLATEAU;  Surgeon: Renette Butters, MD;  Location: [facility];  Service: Orthopedics;  Laterality: Left;   Family History  Problem Relation Age of Onset  . Heart disease Mother   . Cervical cancer Sister   . Prostate cancer Brother   . Colon cancer Neg Hx    Social History  Substance Use Topics  . Smoking status: Current Some  Day Smoker -- 0.10 packs/day    Types: Cigarettes  . Smokeless tobacco: None     Comment: 1-2 cigs/week  . Alcohol Use: 4.2 oz/week    7 Standard drinks or equivalent per week     Comment: beer 1-2 per day   OB History    No data available     Review of Systems  Constitutional: Negative for fever, chills and diaphoresis.  Respiratory: Negative for cough, chest tightness, shortness of breath and wheezing.   Cardiovascular: Negative for chest pain and palpitations.  Gastrointestinal: Positive for nausea, vomiting and abdominal pain. Negative for diarrhea and abdominal distention.  Genitourinary: Negative for dysuria, flank pain and difficulty urinating.  Musculoskeletal: Negative for back pain and neck pain.  Skin:  Negative for color change and rash.  Neurological: Positive for numbness. Negative for dizziness, weakness and headaches.      Allergies  Review of patient's allergies indicates no known allergies.  Home Medications   Prior to Admission medications   Medication Sig Start Date End Date Taking? Authorizing Provider  aspirin 81 MG tablet Take 1 tablet (81 mg total) by mouth daily. 08/14/15  Yes Ejiroghene E Emokpae, MD  atorvastatin (LIPITOR) 40 MG tablet Take 1 tablet (40 mg total) by mouth daily. 08/19/15 08/18/16 Yes Liberty Handy, MD  carvedilol (COREG) 6.25 MG tablet Take 1 tablet (6.25 mg total) by mouth 2 (two) times daily. 12/25/14 12/25/15 Yes [name], MD  lisinopril (PRINIVIL,ZESTRIL) 2.5 MG tablet Take 1 tablet (2.5 mg total) by mouth daily. 12/25/14  Yes [name], MD  nicotine (NICODERM CQ - DOSED IN MG/24 HOURS) 14 mg/24hr patch Place 1 patch (14 mg total) onto the skin daily. 08/19/15  Yes Liberty Handy, MD  sertraline (ZOLOFT) 50 MG tablet Take 1 tablet (50 mg total) by mouth daily. 05/30/15 05/29/16 Yes Marjan Rabbani, MD  fluticasone (FLONASE) 50 MCG/ACT nasal spray Place 1 spray into both nostrils daily. Patient not taking: Reported on 08/19/2015 12/25/14 12/25/15  [name], MD  furosemide (LASIX) 20 MG tablet Take 2 tablets (40 mg total) by mouth daily. Patient not taking: Reported on 07/11/2015 05/23/15   Juluis Mire, MD  pantoprazole (PROTONIX) 40 MG tablet Take 1 tablet (40 mg total) by mouth daily. Patient not taking: Reported on 07/11/2015 12/25/14   [name], MD   BP 123/67 mmHg  Pulse 67  Temp(Src) 98.7 F (37.1 C) (Oral)  Resp 15  Ht _0  (1.575 m)  Wt 207 lb (93.895 kg)  BMI 37.85 kg/m2  SpO2 97%  LMP 12/30/1994 Physical Exam  Constitutional: She appears well-developed and well-nourished. No distress.  HENT:  Head: Normocephalic and atraumatic.  Eyes: Conjunctivae are normal. Right eye exhibits no discharge. Left eye exhibits no discharge. No  scleral icterus.  Neck: Normal range of motion.  Cardiovascular: Normal rate and regular rhythm.   Cap refill < 3 Radial pulses palpable  Pulmonary/Chest: Effort normal and breath sounds normal. No respiratory distress. She has no wheezes. She has no rales. She exhibits tenderness.  Pt with unlabored breathing. Lungs CTAB. Right anterior ribs TTP superior to pt's abdominal pain.   Abdominal: Soft. Bowel sounds are normal. She exhibits no distension. There is tenderness. There is no rebound and no guarding.  Bowel sounds normal. RUQ TTP. No rebound or guarding  Musculoskeletal: Normal range of motion.  Moves extremities spontaneously.   Neurological: She is alert. Coordination normal.  5/5 grip strength. Sensation to light touch intact over BUE  Skin: Skin is  warm and dry.  No color changes over digits.   Psychiatric: She has a normal mood and affect. Her behavior is normal.  Nursing note and vitals reviewed.   ED Course  Procedures (including critical care time) Labs Review Labs Reviewed  BASIC METABOLIC PANEL - Abnormal; Notable for the following:    Sodium 133 (*)    Chloride 98 (*)    Glucose, Bld 106 (*)    All other components within normal limits  CBC - Abnormal; Notable for the following:    RDW 15.6 (*)    All other components within normal limits  Randolm Idol, ED    Imaging Review Dg Chest 2 View  08/23/2015   CLINICAL DATA:  Short of breath. Weakness. Cough. Symptoms since yesterday.  EXAM: CHEST  2 VIEW  COMPARISON:  08/22/2013  FINDINGS: Cardiac silhouette is normal in size. Normal mediastinal and hilar contours.  Lungs are hyperexpanded with prominent bronchovascular markings but are otherwise clear. No pleural effusion or pneumothorax.  Bony thorax is intact.  IMPRESSION: No acute cardiopulmonary disease.   Electronically Signed   By: Lajean Manes M.D.   On: 08/23/2015 07:56   US Abdomen Limited Ruq  08/23/2015   CLINICAL DATA:  Right upper quadrant pain for  1 day  EXAM: US ABDOMEN LIMITED - RIGHT UPPER QUADRANT  COMPARISON:  08/08/2013  FINDINGS: Gallbladder:  Contracted.  No definitive calculi are seen.  Common bile duct:  Diameter: 8.6 mm. Although enlarged for the patient's age this is stable from 2014  Liver:  Increased echogenicity consistent with fatty liver. No focal mass lesion is noted.  IMPRESSION: Prominent but stable common bile duct.  Fatty liver.  Contracted gallbladder.  No acute abnormality is noted.   Electronically Signed   By: Inez Catalina M.D.   On: 08/23/2015 09:31   I have personally reviewed and evaluated these images and lab results as part of my medical decision-making.   EKG Interpretation   Date/Time:  Friday August 23 2015 07:32:32 EDT Ventricular Rate:  81 PR Interval:  164 QRS Duration: 88 QT Interval:  408 QTC Calculation: 473 R Axis:   -32 Text Interpretation:  Normal sinus rhythm Left axis deviation T wave  abnormality, consider lateral ischemia Prolonged QT Abnormal ECG No  significant change since last tracing Confirmed by FLOYD MD, DANIEL  ([id number]) on 08/23/2015 7:52:44 AM      MDM   Final diagnoses:  Nausea  RUQ abdominal pain  Numbness   800 - Pt presenting with RUQ abdominal pain, nausea and vomiting as noted above. She is also complaining of numbness in her digits. The numbness is intermittent over the past week. No weakness or color changes in the hand. Pt recently seen by PCP for chest pain and pt refused hospital admission for a rule out. Scheduled for outpatient stress test yesterday but did not show up. Denies chest pain since last PCP visit. Denies current SOB though triage nurse noted labored breathing. VSS. Pt nontoxic appearing. Lungs CTAB, breathing unlabored. RUQ and right anterior, inferior rib tenderness. No rebound or guarding. Troponin 0.00, remaining labwork reassuring. ECG unchanged since last tracing. CXR negative, RUQ abdominal US negative. Will treat symptoms with zofran and percocet  then reassess.  945 - Pt reports significant improvement in symptoms after percocet and zofran and is now requesting to go home. Repeat abdominal exam shows soft abdomen with minimal RUQ tenderness. Discussed that pt needs to call her PCP after discharge to reschedule her cardiac  stress test. Pt expresses understanding and states that she will call them when she is discharged. Return precautions given in discharge paperwork and discussed with pt at bedside. Pt stable for discharge     Josephina Gip, PA-C 08/23/15 [place], DO 08/23/15 1316

## 2015-08-23 NOTE — Patient Outreach (Signed)
[facility]) Care Management  08/23/2015  Elizabeth [name] 03/07/1956 [id number]   Notification from Johny Shock, RN to close case due to patient refused [place] Management services.  Thanks, [name]. [place], [place] Assistant Phone: [phone] Fax: [phone]

## 2015-08-23 NOTE — ED Notes (Signed)
Pt c/o B/L arm numbness onset for some time. Pt reports that she was supposed to be admitted to the hospital but did not come. Pt with labored breathing. Pt also c/o right upper abdominal pain with N/V all night.

## 2015-08-26 NOTE — Progress Notes (Signed)
Internal Medicine Clinic Attending  I saw and evaluated the patient.  I personally confirmed the key portions of the history and exam documented by Dr. Ford and I reviewed pertinent patient test results.  The assessment, diagnosis, and plan were formulated together and I agree with the documentation in the resident's note. 

## 2015-09-03 ENCOUNTER — Telehealth: Payer: Self-pay | Admitting: Internal Medicine

## 2015-09-03 NOTE — Telephone Encounter (Signed)
Patient would like for you to call her back about her Stress Test that you sch for her.

## 2015-09-04 ENCOUNTER — Ambulatory Visit (INDEPENDENT_AMBULATORY_CARE_PROVIDER_SITE_OTHER): Payer: Medicare Other | Admitting: Internal Medicine

## 2015-09-04 ENCOUNTER — Telehealth: Payer: Self-pay | Admitting: *Deleted

## 2015-09-04 ENCOUNTER — Ambulatory Visit (HOSPITAL_COMMUNITY)
Admission: RE | Admit: 2015-09-04 | Discharge: 2015-09-04 | Disposition: A | Discharge status: Home / Self Care | Payer: Medicare Other | Source: Ambulatory Visit | Attending: Internal Medicine | Admitting: Internal Medicine

## 2015-09-04 VITALS — BP 130/70 | HR 66 | Temp 98.0°F | Ht 60.2 in | Wt 207.1 lb

## 2015-09-04 DIAGNOSIS — R079 Chest pain, unspecified: Secondary | ICD-10-CM

## 2015-09-04 DIAGNOSIS — R9431 Abnormal electrocardiogram [ECG] [EKG]: Secondary | ICD-10-CM | POA: Diagnosis not present

## 2015-09-04 DIAGNOSIS — K1379 Other lesions of oral mucosa: Secondary | ICD-10-CM

## 2015-09-04 DIAGNOSIS — R002 Palpitations: Secondary | ICD-10-CM | POA: Diagnosis not present

## 2015-09-04 DIAGNOSIS — K1329 Other disturbances of oral epithelium, including tongue: Secondary | ICD-10-CM

## 2015-09-04 LAB — TROPONIN I: Troponin I: <0.03 ng/mL (ref <0.031)

## 2015-09-04 NOTE — Telephone Encounter (Signed)
Pt calls for appt, states she awoke this am with blood in her mouth, used a tissue to remove, "it was about a tsp full", she states she is having palpitations now, at this time she was ask to go to urg care or the ED but she refuses "as we speak my fingers are getting numb", she is once again ask to go to ED due to her stated hx of "ive had several heart attacks", she refuses and states she will come today for an appt and "my sister is here and if it gets worse she will take me to [place]" appt is given for 1515 dr Arcelia Jew

## 2015-09-04 NOTE — Telephone Encounter (Signed)
Thank you Bonnita Nasuti... I agree with your assessment. I also think she needs to be assessed sooner than that but as she is currently refusing we will see her in [facility] today.

## 2015-09-07 ENCOUNTER — Encounter: Payer: Self-pay | Admitting: Internal Medicine

## 2015-09-07 DIAGNOSIS — R079 Chest pain, unspecified: Secondary | ICD-10-CM | POA: Insufficient documentation

## 2015-09-07 DIAGNOSIS — R040 Epistaxis: Secondary | ICD-10-CM | POA: Insufficient documentation

## 2015-09-07 NOTE — Assessment & Plan Note (Addendum)
Patient reported heart palpitations and associated chest pain that was concerning for possible ischemia given her description of pressure-like pain and her prior cardiac history. An EKG was checked in office and showed T wave inversions in I, II, III, avL, and V3-V6, but all of these are present on her old EKGs except for V3. She also has a prolonged QT interval which is not new. Will check a troponin to evaluate for cardiac ischemia as well. It's also possible her symptoms are related to anxiety as she appeared anxious on exam and described tingling/numbness in both hands which can happen with hyperventilation. Of note, she had a Myoview scheduled earlier this month but missed the appointment. Will try to reschedule test. - Check troponin>> negative at < 0.03 - Patient instructed to go to emergency room if symptoms reoccur -

## 2015-09-07 NOTE — Assessment & Plan Note (Signed)
The source of the blood she had in her mouth this morning is unclear. There were no apparent lacerations or blood present at time of exam. Possible that she had some nasal irritation or a nosebleed and had blood post-nasally but would expect her to have coughed up blood. She did not report she had brushed her teeth or had any gum sensitivity so less likely to be gum related bleeding. Given the bleeding has subsided and was minimal, will continue to monitor. Patient instructed to return to clinic if the bleeding reoccurs or becomes severe.

## 2015-09-07 NOTE — Progress Notes (Signed)
   Subjective:    Patient ID: Elizabeth [name], female    DOB: 12/04/1955, 59 y.o.   MRN: [id number]  HPI Elizabeth [name] is a 59yo woman with PMHx of HTN, CAD, ischemic cardiomyopathy, chronic diastolic HF, and tobacco abuse who presents today with complaints of blood in her mouth and palpitations.  She reports she woke up this morning to use the bathroom and when she laid back down in bed she noticed a funny taste in her mouth. She then spit into a tissue and saw a small amount of blood. She denies any tongue pain or biting her tongue. She denies any additional bleeding. She denies history of nosebleeds or gum bleeding. She notes she was evaluated by ENT about 3-4 months ago for a "funny smell" in her nose. She was concerned that she had an infection in her nose. She was prescribed Fluticasone nasal spray and only uses it about once every 2 weeks. She thinks the nasal spray may have caused her bleeding.  Additionally, she noted palpitations this afternoon that have remained constant. She reports having chest pressure last night that felt "like something sitting on my chest." She reports associated chest pain this afternoon that she describes as left-sided, deep, and radiates to the middle of her chest. She notes associated shortness of breath, dizziness, and nausea. She also reports tingling in her left hand and right index finger.    Review of Systems General: Denies fever, chills, night sweats, changes in weight, changes in appetite HEENT: Denies headaches, ear pain, changes in vision, rhinorrhea, sore throat CV: Denies orthopnea Pulm: Denies cough, wheezing GI: Denies abdominal pain, nausea, vomiting, diarrhea, constipation, melena, hematochezia GU: Denies dysuria, hematuria, frequency Msk: Denies muscle cramps, joint pains Neuro: Denies weakness Skin: Denies rashes, bruising Psych: Denies depression, hallucinations    Objective:   Physical Exam General: alert, sitting up, appears  anxious HEENT: [place]/AT, EOMI, pharynx non-erythematous, no blood noted in oral cavity, no tongue laceration present Neck: supple, no lymphadenopathy CV: RRR, no m/g/r Pulm: CTA bilaterally, breaths non-labored Abd: BS+, soft, obese, non-tender Ext: warm, no peripheral edema Neuro: alert and oriented x 3    Assessment & Plan:  Please refer to A&P documentation.

## 2015-09-09 NOTE — Telephone Encounter (Signed)
APPT.  FOR STRESS TEST

## 2015-09-10 NOTE — Telephone Encounter (Signed)
Tried calling patient back about stress test but no answer. Will try again.

## 2015-09-12 NOTE — Progress Notes (Signed)
Internal Medicine Clinic Attending  Case discussed with Dr. Arcelia Jew soon after the resident saw the patient.  We reviewed the resident's history and exam and pertinent patient test results.  I agree with the assessment, diagnosis, and plan of care documented in the resident's note.

## 2015-09-20 ENCOUNTER — Telehealth: Payer: Self-pay

## 2015-09-20 NOTE — Telephone Encounter (Signed)
Pt called re missing her nuclear medicine appointment, connected her to [facility] with cardiology office to schedule

## 2015-09-23 ENCOUNTER — Telehealth (HOSPITAL_COMMUNITY): Payer: Self-pay | Admitting: *Deleted

## 2015-09-23 ENCOUNTER — Encounter: Payer: Self-pay | Admitting: *Deleted

## 2015-09-23 ENCOUNTER — Telehealth (HOSPITAL_COMMUNITY): Payer: Self-pay

## 2015-09-23 NOTE — Telephone Encounter (Signed)
Patient given detailed instructions per Myocardial Perfusion Study Information Sheet for the test on 09-24-2015 at 0745. Patient notified to arrive 15 minutes early and that it is imperative to arrive on time for appointment to keep from having the test rescheduled.  If you need to cancel or reschedule your appointment, please call the office within 24 hours of your appointment. Failure to do so may result in a cancellation of your appointment, and a $50 no show fee. Patient verbalized understanding.Oletta Lamas, Yobana Culliton A

## 2015-09-23 NOTE — Telephone Encounter (Signed)
Patient given detailed instructions per Myocardial Perfusion Study Information Sheet for the test on 09/24/15 at 7:45. Patient notified to arrive 15 minutes early and that it is imperative to arrive on time for appointment to keep from having the test rescheduled.  If you need to cancel or reschedule your appointment, please call the office within 24 hours of your appointment. Failure to do so may result in a cancellation of your appointment, and a $50 no show fee. Patient verbalized understanding.Veronia Beets

## 2015-09-24 ENCOUNTER — Ambulatory Visit (HOSPITAL_COMMUNITY): Payer: Medicare Other | Attending: Cardiovascular Disease

## 2015-09-24 DIAGNOSIS — R9439 Abnormal result of other cardiovascular function study: Secondary | ICD-10-CM | POA: Insufficient documentation

## 2015-09-24 DIAGNOSIS — R0609 Other forms of dyspnea: Secondary | ICD-10-CM | POA: Insufficient documentation

## 2015-09-24 DIAGNOSIS — F17200 Nicotine dependence, unspecified, uncomplicated: Secondary | ICD-10-CM | POA: Insufficient documentation

## 2015-09-24 DIAGNOSIS — I25119 Atherosclerotic heart disease of native coronary artery with unspecified angina pectoris: Secondary | ICD-10-CM | POA: Insufficient documentation

## 2015-09-24 DIAGNOSIS — I779 Disorder of arteries and arterioles, unspecified: Secondary | ICD-10-CM | POA: Insufficient documentation

## 2015-09-24 DIAGNOSIS — I517 Cardiomegaly: Secondary | ICD-10-CM | POA: Insufficient documentation

## 2015-09-24 DIAGNOSIS — R079 Chest pain, unspecified: Secondary | ICD-10-CM | POA: Insufficient documentation

## 2015-09-24 DIAGNOSIS — R002 Palpitations: Secondary | ICD-10-CM | POA: Insufficient documentation

## 2015-09-24 DIAGNOSIS — R0602 Shortness of breath: Secondary | ICD-10-CM | POA: Diagnosis not present

## 2015-09-24 DIAGNOSIS — I10 Essential (primary) hypertension: Secondary | ICD-10-CM | POA: Insufficient documentation

## 2015-09-24 MED ORDER — TECHNETIUM TC 99M SESTAMIBI GENERIC - CARDIOLITE
33.0000 | Freq: Once | INTRAVENOUS | Status: AC | PRN
  Administered 2015-09-24: 33 via INTRAVENOUS

## 2015-09-24 MED ORDER — REGADENOSON 0.4 MG/5ML IV SOLN
0.4000 mg | Freq: Once | INTRAVENOUS | Status: AC
  Administered 2015-09-24: 0.4 mg via INTRAVENOUS

## 2015-09-25 ENCOUNTER — Ambulatory Visit (HOSPITAL_COMMUNITY): Payer: Medicare Other

## 2015-09-25 ENCOUNTER — Ambulatory Visit (HOSPITAL_COMMUNITY): Payer: Medicare Other | Attending: Cardiology

## 2015-09-25 DIAGNOSIS — R0989 Other specified symptoms and signs involving the circulatory and respiratory systems: Secondary | ICD-10-CM

## 2015-09-25 LAB — MYOCARDIAL PERFUSION IMAGING
CHL CUP NUCLEAR SRS: 8
CHL CUP NUCLEAR SSS: 9
CSEPPHR: 96 {beats}/min
LHR: 0.22
LV dias vol: 126 mL
LVSYSVOL: 71 mL
NUC STRESS TID: 1.13
Rest HR: 64 {beats}/min
SDS: 1

## 2015-09-25 MED ORDER — TECHNETIUM TC 99M SESTAMIBI GENERIC - CARDIOLITE
32.7000 | Freq: Once | INTRAVENOUS | Status: AC | PRN
  Administered 2015-09-25: 32.7 via INTRAVENOUS

## 2015-12-04 ENCOUNTER — Encounter: Payer: Self-pay | Admitting: Pulmonary Disease

## 2015-12-04 ENCOUNTER — Ambulatory Visit (INDEPENDENT_AMBULATORY_CARE_PROVIDER_SITE_OTHER): Payer: Medicare Other | Admitting: Pulmonary Disease

## 2015-12-04 VITALS — BP 169/94 | HR 67 | Temp 98.0°F | Resp 18 | Ht 62.0 in | Wt 209.6 lb

## 2015-12-04 DIAGNOSIS — I2510 Atherosclerotic heart disease of native coronary artery without angina pectoris: Secondary | ICD-10-CM

## 2015-12-04 DIAGNOSIS — Z7982 Long term (current) use of aspirin: Secondary | ICD-10-CM | POA: Diagnosis not present

## 2015-12-04 DIAGNOSIS — F329 Major depressive disorder, single episode, unspecified: Secondary | ICD-10-CM

## 2015-12-04 DIAGNOSIS — F419 Anxiety disorder, unspecified: Secondary | ICD-10-CM

## 2015-12-04 DIAGNOSIS — R040 Epistaxis: Secondary | ICD-10-CM

## 2015-12-04 DIAGNOSIS — I10 Essential (primary) hypertension: Secondary | ICD-10-CM

## 2015-12-04 DIAGNOSIS — F418 Other specified anxiety disorders: Secondary | ICD-10-CM

## 2015-12-04 DIAGNOSIS — E785 Hyperlipidemia, unspecified: Secondary | ICD-10-CM | POA: Diagnosis not present

## 2015-12-04 DIAGNOSIS — Z955 Presence of coronary angioplasty implant and graft: Secondary | ICD-10-CM

## 2015-12-04 MED ORDER — LISINOPRIL 2.5 MG PO TABS: 2.5000 mg | ORAL_TABLET | Freq: Every day | ORAL | Status: DC

## 2015-12-04 MED ORDER — FUROSEMIDE 20 MG PO TABS: 40.0000 mg | ORAL_TABLET | Freq: Every day | ORAL | Status: DC

## 2015-12-04 MED ORDER — SERTRALINE HCL 50 MG PO TABS: 50.0000 mg | ORAL_TABLET | Freq: Every day | ORAL | Status: DC

## 2015-12-04 MED ORDER — ATORVASTATIN CALCIUM 40 MG PO TABS: 40.0000 mg | ORAL_TABLET | Freq: Every day | ORAL | Status: DC

## 2015-12-04 MED ORDER — CARVEDILOL 6.25 MG PO TABS: 6.2500 mg | ORAL_TABLET | Freq: Two times a day (BID) | ORAL | Status: DC

## 2015-12-04 MED ORDER — PANTOPRAZOLE SODIUM 40 MG PO TBEC: 40.0000 mg | DELAYED_RELEASE_TABLET | Freq: Every day | ORAL | Status: DC

## 2015-12-04 NOTE — Progress Notes (Signed)
Subjective:    Patient ID: Elizabeth [name], female    DOB: 1956-02-13, 60 y.o.   MRN: [id number]  HPI [name] is a 60 year old woman with history of HTN, HLD, substance abuse, Lupus, GERD, CAD s/p PCI in 2012, depression presenting for evaluation of epistaxis.  She was last seen in clinic on 09/07/2015. At that time she noted some blood in her mouth of unknown origin.  She noticed blood with sniffing her nose yesterday. She blew her nose and saw blood clots. Her last episode was earlier in the day. She has had resolution since then. She has not used cocaine for over 6 months and never used it intranasally. She reports she had nose bleeds more frequently when she was a child. She has not been using any nasal sprays.  Review of Systems Constitutional: no fevers/chills Respiratory: no shortness of breath Cardiovascular: no chest pain Gastrointestinal: no nausea/vomiting, no abdominal pain, no diarrhea  Past Medical History  Diagnosis Date  . Hypertension   . HLD (hyperlipidemia)     Chol = 235, LDL = 156 (08/2010)  . Substance abuse     cocaine   . Lupus ([place]) 2009    ANA + 10/2008, repeat ANA + (05/2009), on that visit 05/2009 following labs obtained RF <20, CRP <0.4, ANA titers 1:80,   . Pulmonary nodules 05/2008    noted on CXR and CT 05/2008, repeat CT 10/2008 - Stable small bilateral pulmonary nodules measuring up to 6 m m  . Insomnia   . GERD (gastroesophageal reflux disease)   . Fibroids   . History of microcytic hypochromic anemia   . Fibromyalgia   . Myocardial infarction ([place])   . CAD (coronary artery disease)     NSTEMI 08/2011:  LHC 08/21/11: mLAD 60-70%, pCFX occluded, dRCA chronic occlusion with L-R collats, EF 40-45%, inf AK.  PCI:  BMS to CFX.  Marland Kitchen Depression   . Shortness of breath 08/22/2013    Current Outpatient Prescriptions on File Prior to Visit  Medication Sig Dispense Refill  . aspirin 81 MG tablet Take 1 tablet (81 mg total) by mouth  daily. 30 tablet 11  . atorvastatin (LIPITOR) 40 MG tablet Take 1 tablet (40 mg total) by mouth daily. 30 tablet 11  . carvedilol (COREG) 6.25 MG tablet Take 1 tablet (6.25 mg total) by mouth 2 (two) times daily. 60 tablet 5  . fluticasone (FLONASE) 50 MCG/ACT nasal spray Place 1 spray into both nostrils daily. (Patient not taking: Reported on 08/19/2015) 9.9 g 0  . furosemide (LASIX) 20 MG tablet Take 2 tablets (40 mg total) by mouth daily. (Patient not taking: Reported on 07/11/2015) 30 tablet 5  . lisinopril (PRINIVIL,ZESTRIL) 2.5 MG tablet Take 1 tablet (2.5 mg total) by mouth daily. 30 tablet 5  . nicotine (NICODERM CQ - DOSED IN MG/24 HOURS) 14 mg/24hr patch Place 1 patch (14 mg total) onto the skin daily. 30 patch 2  . pantoprazole (PROTONIX) 40 MG tablet Take 1 tablet (40 mg total) by mouth daily. (Patient not taking: Reported on 07/11/2015) 30 tablet 5  . sertraline (ZOLOFT) 50 MG tablet Take 1 tablet (50 mg total) by mouth daily. 30 tablet 2   No current facility-administered medications on file prior to visit.    Today's Vitals   12/04/15 1436  BP: 177/94  Pulse: 69  Temp: 98 F (36.7 C)  TempSrc: Oral  Resp: 18  Height: 5' 2" (1.575 m)  Weight: 209 lb 9.6  oz (95.074 kg)  SpO2: 100%    Objective:   Physical Exam  Constitutional: She appears well-developed and well-nourished.  HENT:  Head: Normocephalic and atraumatic.  Nose: Nose normal.  Mouth/Throat: Oropharynx is clear and moist. No oropharyngeal exudate.  Eyes: Conjunctivae are normal.  Cardiovascular: Normal rate, regular rhythm and normal heart sounds.   Pulmonary/Chest: Effort normal and breath sounds normal. No respiratory distress. She has no wheezes.  Abdominal: Soft. She exhibits no distension. There is no tenderness.   Assessment & Plan:  Please refer to problem based charting.

## 2015-12-04 NOTE — Patient Instructions (Addendum)
Nosebleed Nosebleeds are common. A nosebleed can be caused by many things, including:  Getting hit hard in the nose.  Infections.  Dryness in your nose.  A dry climate.  Medicines.  Picking your nose.  Your home heating and cooling systems. HOME CARE   Try controlling your nosebleed by pinching your nostrils gently. Do this for at least 10 minutes.  Avoid blowing or sniffing your nose for a number of hours after having a nosebleed.  Do not put gauze inside of your nose yourself. If your nose was packed by your doctor, try to keep the pack inside of your nose until your doctor removes it.  If a gauze pack was used and it starts to fall out, gently replace it or cut off the end of it.  If a balloon catheter was used to pack your nose, do not cut or remove it unless told by your doctor.  Avoid lying down while you are having a nosebleed. Sit up and lean forward.  Use a nasal spray decongestant to help with a nosebleed as told by your doctor.  Do not use petroleum jelly or mineral oil in your nose. These can drip into your lungs.  Keep your house humid by using:  Less air conditioning.  A humidifier.  Resume your normal activities as you are able. Avoid straining, lifting, or bending at your waist for several days.  If your nosebleed was caused by dryness in your nose, use over-the-counter saline nasal spray or gel. If you must use a lubricant:  Choose one that is water-soluble.  Use it only as needed.  Do not use it within several hours of lying down.  Keep all follow-up visits as told by your doctor. This is important. GET HELP IF:  You have a fever.  You get frequent nosebleeds.  You are getting nosebleeds more often. GET HELP RIGHT AWAY IF:  Your nosebleed lasts longer than 20 minutes.  Your nosebleed occurs after an injury to your face, and your nose looks crooked or broken.  You have unusual bleeding from other parts of your body.  You have unusual  bruising on other parts of your body.  You feel light-headed or dizzy.  You become sweaty.  You throw up (vomit) blood.  You have a nosebleed after a head injury.   This information is not intended to replace advice given to you by your health care provider. Make sure you discuss any questions you have with your health care provider.   Document Released: 08/11/2008 Document Revised: 11/23/2014 Document Reviewed: 06/18/2014 Elsevier Interactive Patient Education [employer].

## 2015-12-06 NOTE — Assessment & Plan Note (Signed)
BP Readings from Last 3 Encounters:  12/04/15 169/94  09/04/15 130/70  08/23/15 123/67    Lab Results  Component Value Date   NA 133* 08/23/2015   K 3.8 08/23/2015   CREATININE 0.89 08/23/2015    Assessment: Blood pressure control: Elevated Progress toward BP goal:  Deteriorated Comments: Ran out of her blood pressure medications a month ago  Plan: Medications:  Restart carvedilol 6.42m BID, Lasix 488mdaily prn, lisinopril 2.51m24maily. Other plans:  -Follow up in 3-4 weeks for BP recheck.

## 2015-12-06 NOTE — Assessment & Plan Note (Signed)
Assessment: Stable  Plan: -Refilled sertraline

## 2015-12-06 NOTE — Assessment & Plan Note (Signed)
Assessment: Self resolved epistaxis. Probable low moisture content in ambient air causing mucosal dryness. No blood or areas of active bleeding noted on exam. She is on aspirin but will need to continue this due to her history of CAD s/p PCI in 2012.  Plan: -Supportive care for now. Patient instructed on how to stop bleeding if it recurs and return precautions. -May consider saline nasal spray or bacitracin ointment gently applied to the nasal mucosa with a cotton-tipped swab to prevent recurrence

## 2015-12-06 NOTE — Assessment & Plan Note (Signed)
Assessment: On atorvastatin 20m for secondary prevention  Plan: -Refilled atorvastatin.

## 2015-12-06 NOTE — Progress Notes (Signed)
Internal Medicine Clinic Attending  Case discussed with Dr. Randell Patient at the time of the visit.  We reviewed the resident's history and exam and pertinent patient test results.  I agree with the assessment, diagnosis, and plan of care documented in the resident's note.

## 2016-02-03 DIAGNOSIS — H401132 Primary open-angle glaucoma, bilateral, moderate stage: Secondary | ICD-10-CM | POA: Diagnosis not present

## 2016-03-06 ENCOUNTER — Other Ambulatory Visit: Payer: Self-pay | Admitting: Internal Medicine

## 2016-03-06 ENCOUNTER — Emergency Department (HOSPITAL_COMMUNITY): Payer: Medicare Other

## 2016-03-06 ENCOUNTER — Observation Stay (HOSPITAL_BASED_OUTPATIENT_CLINIC_OR_DEPARTMENT_OTHER): Payer: Medicare Other

## 2016-03-06 ENCOUNTER — Observation Stay (HOSPITAL_COMMUNITY)
Admission: EM | Admit: 2016-03-06 | Discharge: 2016-03-06 | Disposition: A | Discharge status: Home / Self Care | Payer: Medicare Other | Attending: Internal Medicine | Admitting: Internal Medicine

## 2016-03-06 ENCOUNTER — Encounter (HOSPITAL_COMMUNITY): Payer: Self-pay

## 2016-03-06 DIAGNOSIS — F329 Major depressive disorder, single episode, unspecified: Secondary | ICD-10-CM | POA: Insufficient documentation

## 2016-03-06 DIAGNOSIS — F1421 Cocaine dependence, in remission: Secondary | ICD-10-CM

## 2016-03-06 DIAGNOSIS — E785 Hyperlipidemia, unspecified: Secondary | ICD-10-CM

## 2016-03-06 DIAGNOSIS — F1411 Cocaine abuse, in remission: Secondary | ICD-10-CM | POA: Diagnosis present

## 2016-03-06 DIAGNOSIS — F17210 Nicotine dependence, cigarettes, uncomplicated: Secondary | ICD-10-CM | POA: Diagnosis not present

## 2016-03-06 DIAGNOSIS — R0789 Other chest pain: Secondary | ICD-10-CM

## 2016-03-06 DIAGNOSIS — Z955 Presence of coronary angioplasty implant and graft: Secondary | ICD-10-CM

## 2016-03-06 DIAGNOSIS — Z86018 Personal history of other benign neoplasm: Secondary | ICD-10-CM | POA: Diagnosis not present

## 2016-03-06 DIAGNOSIS — Z9861 Coronary angioplasty status: Secondary | ICD-10-CM

## 2016-03-06 DIAGNOSIS — R079 Chest pain, unspecified: Secondary | ICD-10-CM | POA: Diagnosis not present

## 2016-03-06 DIAGNOSIS — I2510 Atherosclerotic heart disease of native coronary artery without angina pectoris: Secondary | ICD-10-CM | POA: Diagnosis present

## 2016-03-06 DIAGNOSIS — I4891 Unspecified atrial fibrillation: Secondary | ICD-10-CM

## 2016-03-06 DIAGNOSIS — I252 Old myocardial infarction: Secondary | ICD-10-CM | POA: Insufficient documentation

## 2016-03-06 DIAGNOSIS — I5033 Acute on chronic diastolic (congestive) heart failure: Secondary | ICD-10-CM | POA: Diagnosis present

## 2016-03-06 DIAGNOSIS — I10 Essential (primary) hypertension: Secondary | ICD-10-CM | POA: Diagnosis not present

## 2016-03-06 DIAGNOSIS — F419 Anxiety disorder, unspecified: Secondary | ICD-10-CM | POA: Diagnosis present

## 2016-03-06 DIAGNOSIS — Z79899 Other long term (current) drug therapy: Secondary | ICD-10-CM | POA: Insufficient documentation

## 2016-03-06 DIAGNOSIS — R000 Tachycardia, unspecified: Secondary | ICD-10-CM | POA: Insufficient documentation

## 2016-03-06 DIAGNOSIS — K219 Gastro-esophageal reflux disease without esophagitis: Secondary | ICD-10-CM | POA: Insufficient documentation

## 2016-03-06 DIAGNOSIS — G4700 Insomnia, unspecified: Secondary | ICD-10-CM | POA: Diagnosis not present

## 2016-03-06 DIAGNOSIS — M797 Fibromyalgia: Secondary | ICD-10-CM | POA: Diagnosis not present

## 2016-03-06 DIAGNOSIS — R768 Other specified abnormal immunological findings in serum: Secondary | ICD-10-CM | POA: Diagnosis present

## 2016-03-06 DIAGNOSIS — Z720 Tobacco use: Secondary | ICD-10-CM | POA: Diagnosis present

## 2016-03-06 DIAGNOSIS — E559 Vitamin D deficiency, unspecified: Secondary | ICD-10-CM | POA: Diagnosis present

## 2016-03-06 DIAGNOSIS — R7303 Prediabetes: Secondary | ICD-10-CM | POA: Diagnosis present

## 2016-03-06 DIAGNOSIS — R0602 Shortness of breath: Secondary | ICD-10-CM | POA: Diagnosis not present

## 2016-03-06 DIAGNOSIS — J811 Chronic pulmonary edema: Secondary | ICD-10-CM | POA: Diagnosis present

## 2016-03-06 DIAGNOSIS — Z7982 Long term (current) use of aspirin: Secondary | ICD-10-CM | POA: Insufficient documentation

## 2016-03-06 LAB — MRSA PCR SCREENING: MRSA by PCR: NEGATIVE

## 2016-03-06 LAB — CBC
HEMATOCRIT: 44.4 % (ref 36.0–46.0)
Hemoglobin: 14.2 g/dL (ref 12.0–15.0)
MCH: 27.8 pg (ref 26.0–34.0)
MCHC: 32 g/dL (ref 30.0–36.0)
MCV: 87.1 fL (ref 78.0–100.0)
PLATELETS: 254 10*3/uL (ref 150–400)
RBC: 5.1 MIL/uL (ref 3.87–5.11)
RDW: 15.3 % (ref 11.5–15.5)
WBC: 7.1 10*3/uL (ref 4.0–10.5)

## 2016-03-06 LAB — COMPREHENSIVE METABOLIC PANEL
ALT: 22 U/L (ref 14–54)
AST: 22 U/L (ref 15–41)
Albumin: 3.7 g/dL (ref 3.5–5.0)
Alkaline Phosphatase: 112 U/L (ref 38–126)
Anion gap: 12 (ref 5–15)
BUN: 9 mg/dL (ref 6–20)
CO2: 18 mmol/L — AB (ref 22–32)
CREATININE: 0.71 mg/dL (ref 0.44–1.00)
Calcium: 9.4 mg/dL (ref 8.9–10.3)
Chloride: 107 mmol/L (ref 101–111)
Glucose, Bld: 103 mg/dL — ABNORMAL HIGH (ref 65–99)
POTASSIUM: 4.2 mmol/L (ref 3.5–5.1)
SODIUM: 137 mmol/L (ref 135–145)
Total Bilirubin: 0.3 mg/dL (ref 0.3–1.2)
Total Protein: 7.7 g/dL (ref 6.5–8.1)

## 2016-03-06 LAB — TROPONIN I
Troponin I: <0.03 ng/mL (ref <0.031)
Troponin I: 0.03 ng/mL (ref <0.031)

## 2016-03-06 LAB — BRAIN NATRIURETIC PEPTIDE: B NATRIURETIC PEPTIDE 5: 209.1 pg/mL — AB (ref 0.0–100.0)

## 2016-03-06 LAB — TSH: TSH: 1.708 u[IU]/mL (ref 0.350–4.500)

## 2016-03-06 LAB — BASIC METABOLIC PANEL
Anion gap: 14 (ref 5–15)
BUN: 9 mg/dL (ref 6–20)
CO2: 22 mmol/L (ref 22–32)
CREATININE: 0.87 mg/dL (ref 0.44–1.00)
Calcium: 9.2 mg/dL (ref 8.9–10.3)
Chloride: 105 mmol/L (ref 101–111)
GFR calc Af Amer: >60 mL/min (ref >60)
GLUCOSE: 125 mg/dL — AB (ref 65–99)
POTASSIUM: 3.3 mmol/L — AB (ref 3.5–5.1)
SODIUM: 141 mmol/L (ref 135–145)

## 2016-03-06 LAB — I-STAT TROPONIN, ED: Troponin i, poc: 0.02 ng/mL (ref 0.00–0.08)

## 2016-03-06 LAB — RAPID URINE DRUG SCREEN, HOSP PERFORMED
Amphetamines: NOT DETECTED
Barbiturates: NOT DETECTED
Benzodiazepines: NOT DETECTED
Cocaine: NOT DETECTED
Opiates: NOT DETECTED
Tetrahydrocannabinol: NOT DETECTED

## 2016-03-06 LAB — ECHOCARDIOGRAM COMPLETE

## 2016-03-06 LAB — MAGNESIUM: MAGNESIUM: 2.3 mg/dL (ref 1.7–2.4)

## 2016-03-06 MED ORDER — ASPIRIN 81 MG PO CHEW
81.0000 mg | CHEWABLE_TABLET | Freq: Every day | ORAL | Status: DC
  Administered 2016-03-06: 81 mg via ORAL
  Filled 2016-03-06: qty 1

## 2016-03-06 MED ORDER — CARVEDILOL 12.5 MG PO TABS
12.5000 mg | ORAL_TABLET | Freq: Two times a day (BID) | ORAL | Status: DC
  Administered 2016-03-06: 12.5 mg via ORAL
  Filled 2016-03-06: qty 1

## 2016-03-06 MED ORDER — PANTOPRAZOLE SODIUM 40 MG PO TBEC: 40.0000 mg | DELAYED_RELEASE_TABLET | Freq: Every day | ORAL | Status: DC

## 2016-03-06 MED ORDER — FUROSEMIDE 10 MG/ML IJ SOLN: 40.0000 mg | Freq: Two times a day (BID) | INTRAMUSCULAR | Status: DC

## 2016-03-06 MED ORDER — ATORVASTATIN CALCIUM 80 MG PO TABS: 80.0000 mg | ORAL_TABLET | Freq: Every day | ORAL | Status: DC

## 2016-03-06 MED ORDER — APIXABAN 5 MG PO TABS
5.0000 mg | ORAL_TABLET | Freq: Two times a day (BID) | ORAL | Status: DC
  Administered 2016-03-06: 5 mg via ORAL
  Filled 2016-03-06: qty 1

## 2016-03-06 MED ORDER — APIXABAN 5 MG PO TABS: 5.0000 mg | ORAL_TABLET | Freq: Two times a day (BID) | ORAL | Status: DC

## 2016-03-06 MED ORDER — ASPIRIN 325 MG PO TABS
162.0000 mg | ORAL_TABLET | Freq: Once | ORAL | Status: AC
  Administered 2016-03-06: 162 mg via ORAL
  Filled 2016-03-06: qty 1

## 2016-03-06 MED ORDER — SODIUM CHLORIDE 0.9% FLUSH
3.0000 mL | Freq: Two times a day (BID) | INTRAVENOUS | Status: DC
  Administered 2016-03-06: 3 mL via INTRAVENOUS

## 2016-03-06 MED ORDER — ENOXAPARIN SODIUM 40 MG/0.4ML ~~LOC~~ SOLN
40.0000 mg | SUBCUTANEOUS | Status: DC
  Administered 2016-03-06: 40 mg via SUBCUTANEOUS
  Filled 2016-03-06: qty 0.4

## 2016-03-06 MED ORDER — SERTRALINE HCL 50 MG PO TABS
50.0000 mg | ORAL_TABLET | Freq: Every day | ORAL | Status: DC
  Filled 2016-03-06: qty 1

## 2016-03-06 MED ORDER — FUROSEMIDE 10 MG/ML IJ SOLN
80.0000 mg | Freq: Once | INTRAMUSCULAR | Status: AC
  Administered 2016-03-06: 80 mg via INTRAVENOUS
  Filled 2016-03-06: qty 8

## 2016-03-06 MED ORDER — SENNOSIDES-DOCUSATE SODIUM 8.6-50 MG PO TABS
1.0000 | ORAL_TABLET | Freq: Every evening | ORAL | Status: DC | PRN
  Filled 2016-03-06: qty 1

## 2016-03-06 MED ORDER — FUROSEMIDE 20 MG PO TABS: 40.0000 mg | ORAL_TABLET | Freq: Every day | ORAL | Status: DC

## 2016-03-06 MED ORDER — CARVEDILOL 12.5 MG PO TABS: 12.5000 mg | ORAL_TABLET | Freq: Two times a day (BID) | ORAL | Status: DC

## 2016-03-06 MED ORDER — ACETAMINOPHEN 650 MG RE SUPP: 650.0000 mg | Freq: Four times a day (QID) | RECTAL | Status: DC | PRN

## 2016-03-06 MED ORDER — VITAMIN D (ERGOCALCIFEROL) 1.25 MG (50000 UNIT) PO CAPS
50000.0000 [IU] | ORAL_CAPSULE | ORAL | Status: DC
  Administered 2016-03-06: 50000 [IU] via ORAL
  Filled 2016-03-06: qty 1

## 2016-03-06 MED ORDER — FUROSEMIDE 10 MG/ML IJ SOLN: 60.0000 mg | Freq: Once | INTRAMUSCULAR | Status: DC

## 2016-03-06 MED ORDER — PANTOPRAZOLE SODIUM 40 MG PO TBEC
40.0000 mg | DELAYED_RELEASE_TABLET | Freq: Every day | ORAL | Status: DC
  Administered 2016-03-06: 40 mg via ORAL
  Filled 2016-03-06 (×2): qty 1

## 2016-03-06 MED ORDER — ATORVASTATIN CALCIUM 40 MG PO TABS
40.0000 mg | ORAL_TABLET | Freq: Every day | ORAL | Status: DC
  Administered 2016-03-06: 40 mg via ORAL
  Filled 2016-03-06: qty 1

## 2016-03-06 MED ORDER — NITROGLYCERIN 0.4 MG SL SUBL
0.4000 mg | SUBLINGUAL_TABLET | SUBLINGUAL | Status: AC | PRN
  Administered 2016-03-06 (×3): 0.4 mg via SUBLINGUAL
  Filled 2016-03-06: qty 1

## 2016-03-06 MED ORDER — ACETAMINOPHEN 325 MG PO TABS
650.0000 mg | ORAL_TABLET | Freq: Four times a day (QID) | ORAL | Status: DC | PRN
Start: 2016-03-06 — End: 2016-03-06

## 2016-03-06 MED ORDER — CARVEDILOL 6.25 MG PO TABS
6.2500 mg | ORAL_TABLET | Freq: Two times a day (BID) | ORAL | Status: DC
  Administered 2016-03-06: 6.25 mg via ORAL
  Filled 2016-03-06: qty 1

## 2016-03-06 MED ORDER — ASPIRIN 325 MG PO TABS: 325.0000 mg | ORAL_TABLET | Freq: Once | ORAL | Status: DC

## 2016-03-06 MED ORDER — DILTIAZEM HCL 25 MG/5ML IV SOLN
10.0000 mg | Freq: Once | INTRAVENOUS | Status: AC
  Administered 2016-03-06: 10 mg via INTRAVENOUS
  Filled 2016-03-06: qty 5

## 2016-03-06 MED ORDER — FUROSEMIDE 10 MG/ML IJ SOLN: 40.0000 mg | Freq: Once | INTRAMUSCULAR | Status: DC

## 2016-03-06 MED ORDER — POTASSIUM CHLORIDE CRYS ER 20 MEQ PO TBCR
60.0000 meq | EXTENDED_RELEASE_TABLET | Freq: Once | ORAL | Status: AC
  Administered 2016-03-06: 60 meq via ORAL
  Filled 2016-03-06: qty 3

## 2016-03-06 MED ORDER — LISINOPRIL 2.5 MG PO TABS
2.5000 mg | ORAL_TABLET | Freq: Every day | ORAL | Status: DC
  Administered 2016-03-06: 2.5 mg via ORAL
  Filled 2016-03-06: qty 1

## 2016-03-06 NOTE — Progress Notes (Signed)
  Echocardiogram 2D Echocardiogram has been performed.  Elizabeth [name] 03/06/2016, 9:27 AM

## 2016-03-06 NOTE — ED Notes (Signed)
Pt here with central, chest pain that started this morning while she was laying down eating doritos. Pt also reports shortness of breath. Hx of 2 heart attacks and this pain feels similar. Pt very anxious and tearful at triage.

## 2016-03-06 NOTE — Discharge Summary (Signed)
Name: Elizabeth [name] MRN: [id number] DOB: 11/15/1956 60 y.o. PCP: Liberty Handy, MD  Date of Admission: 03/06/2016  1:43 AM Date of Discharge: 03/06/2016 Attending Physician: Bartholomew Crews, MD  Discharge Diagnosis:  Principal Problem:   Chest pain Active Problems:   Dyslipidemia   Anxiety and depression   Essential hypertension   Coronary atherosclerosis   LUPUS   Prediabetes   Acute on chronic diastolic (congestive) heart failure (HCC)   Tobacco abuse   Vitamin D deficiency   History of cocaine abuse   Pulmonary edema  Discharge Medications:   Medication List    STOP taking these medications        aspirin 81 MG tablet      TAKE these medications        apixaban 5 MG Tabs tablet  Commonly known as:  ELIQUIS  Take 1 tablet (5 mg total) by mouth 2 (two) times daily.     atorvastatin 80 MG tablet  Commonly known as:  LIPITOR  Take 1 tablet (80 mg total) by mouth daily.     carvedilol 12.5 MG tablet  Commonly known as:  COREG  Take 1 tablet (12.5 mg total) by mouth 2 (two) times daily.     furosemide 20 MG tablet  Commonly known as:  LASIX  Take 2 tablets (40 mg total) by mouth daily.     lisinopril 2.5 MG tablet  Commonly known as:  PRINIVIL,ZESTRIL  Take 1 tablet (2.5 mg total) by mouth daily.     pantoprazole 40 MG tablet  Commonly known as:  PROTONIX  Take 1 tablet (40 mg total) by mouth daily.     sertraline 50 MG tablet  Commonly known as:  ZOLOFT  Take 1 tablet (50 mg total) by mouth daily.        Disposition and follow-up:   Ms.Savi P Khokhar was discharged from [facility] in Good condition.  At the hospital follow up visit please address:  Labs / imaging needed at time of follow-up:   Pending labs/ test needing follow-up:   Follow-up Appointments:     Follow-up Information    Follow up with Liberty Handy, MD. Go on 03/18/2016.   Specialty:  Internal Medicine   Why:  Follow up appointnment at 10:15  am.    Contact information:   [address] [place] [address] [phone]       Follow up with CROITORU,MIHAI, MD. Schedule an appointment as soon as possible for a visit in 2 weeks.   Specialty:  Cardiology   Why:  Follow up   Contact information:   [address] [place] [address] [phone]       Discharge Instructions:   Consultations: Treatment Team:  Rounding Lbcardiology, MD  Procedures Performed:  Dg Chest 2 View  03/06/2016  CLINICAL DATA:  Chest pain.  Shortness of breath tonight. EXAM: CHEST  2 VIEW COMPARISON:  Radiographs 08/23/2015 FINDINGS: The lungs are hyperinflated, unchanged. Borderline cardiomegaly. Ill-defined bilateral infrahilar opacities. Increased interstitial opacities are again seen, with mild progression from prior. No pleural effusion or pneumothorax. Probable bony under mineralization. IMPRESSION: 1. Increased interstitial markings since prior exam, may be pulmonary edema or atypical infection superimposed on chronic lung disease. Additionally there are ill-defined infrahilar opacities, vascular, atelectasis, or aspiration given distribution. Multifocal pneumonia could have a similar appearance. 2. Chronic hyperinflation. Electronically Signed   By: Jeb Levering M.D.   On: 03/06/2016 02:38    2D  Echo (03/06/16):  Study Conclusions  - Left ventricle: The cavity size was normal. Wall thickness was  increased in a pattern of mild LVH. Systolic function was low  normal to mildly reduced. The estimated ejection fraction was in  the range of 50% to 55%. Wall motion was normal; there were no  regional wall motion abnormalities. Doppler parameters are  consistent with abnormal left ventricular relaxation (grade 1  diastolic dysfunction). - Aortic valve: There was no stenosis. There was trivial  regurgitation. - Mitral valve: There was trivial regurgitation. - Right ventricle: The cavity size was normal. Systolic  function  was normal. - Pulmonary arteries: No complete TR doppler jet so unable to  estimate PA systolic pressure. - Inferior vena cava: The vessel was normal in size. The  respirophasic diameter changes were in the normal range (>= 50%),  consistent with normal central venous pressure.  Impressions:  - Normal LV size with mild LV hypertrophy. Low normal to mildly  decreased systolic function, EF 17-00%. Normal RV size and  systolic function. No significant valvular abnormalities.  Admission HPI: [name] is a 60 year old female with PMH of 2 MIs (NSTEMI 2012), CAD s/p PCI with BMS in 2013, dCHF (EF 55-60% in 04/2014), HTN, HLD, GERD, cocaine abuse, and TIA who presents with chest pain. Patient's pain began 10 pm last night suddenly while she was resting in bed. It was located across her left chest, described as a heavy ache, "20/10" in intensity, with associated numbness in her left fingers, palpitations, SOB, nausea, and vomiting. She initially thought this was heartburn as she had just eaten nacho cheese, although she felt this was similar to her prior heart attack pain. She tried her acid reflux medication without relief. She says the pain did not radiate to her neck, jaw, or back. She denies any fevers, chills, diaphoresis, abdominal pain, dysuria, focal weakness, loss of consciousness. She reports a chronic cough with occasional blood tinged sputum production.  Patient states that for the last month she has increased dyspnea on exertion, orthopnea (usually sleeps with 2 thick pillows below head), lower extremity swelling, weight gain, and fatigue. She says she has been out of her home Lasix for 1 month due to difficulty affording it.  ED vitals: BP 143/99 mmHg  Pulse 105  Temp(Src) 98.5 F (36.9 C) (Oral)  Resp 25  SpO2 94% RA  In the ED, patient was noted to be in Atrial Fibrillation with RVR rate in the 130s. She received 10 mg IV Diltiazem with rate improved to  low 100s. She was given ASA 162 mg once and Nitroglycerin x 3 with improvement in her left sided chest pain. I-stat troponin was 0.02.  When seen by [place] in the ED, patient reported her severe chest pain is much improved, but now has her typical heartburn symptoms.  Social Hx: Smokes ~ 4 cigs/day since age 54, ~40 oz EtOH per week, history of cocaine use (last use in November 2016)  Family Hx: Heart disease mother  Hospital Course by problem list: Principal Problem:   Chest pain Active Problems:   Dyslipidemia   Anxiety and depression   Essential hypertension   Coronary atherosclerosis   LUPUS   Prediabetes   Acute on chronic diastolic (congestive) heart failure (HCC)   Tobacco abuse   Vitamin D deficiency   History of cocaine abuse   Pulmonary edema   Chest Pain with both typical and atypical features: Likely due to tachyarrhythmia. Patient presented with  left sided chest pain at rest improved with nitroglycerin with initial troponin of 0.02. Initial EKG showed Afib with RVR and ST depressions in the inferior and lateral leads concerning for ACS. She did report worsening symptoms of DOE, SOB, orthopnea for the last month while not taking her home lasix. CXR showed increased interstitial changes suggestive of pulmonary edema. She received IV diltiazem which improved her rate and subsequent EKG did show return to sinus rhythm and some improvement with regards to her ST depression. She was started on diureses. Troponins were negative. Echo showed mild LVH, LVEF 50-55%, and grade 1 diastolic dysfunction. RV size and systolic function is normal. No significant valvular abnormalities. Patient was stable prior to discharge and not complaining of any CP or SOB. She has been scheduled to follow-up at our internal medicine clinic on 03/18/16 and has been advised to follow-up with cardiology as outpatient.   Acute on Chronic Diastolic CHF: May have triggered her Afib and chest pain. BNP 209. Echo showed  mild LVH, LVEF 50-55%, and grade 1 diastolic dysfunction. Lungs were clear on exam but she had trace bilateral lower extremity edema. She was given IV Lasix 80 mg once. Dose of home medication Carvedilol was increased.    Transient Afib: Converted to sinus rhythm after Diltiazem given. CHADSVASc score of 6. TSH normal. Patient was started on Eliquis.    CAD s/p BMS: Patient had a NSTEMI in 2012, previously on Aspirin and Plavix, now only on Aspirin. 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 Recent nuclear study without any extensive reversible ischemia. Cardiology recommended medical management - Atorvastatin, Carvedilol, and Lisinopril. Dose of home Atorvastatin was increased.   Discharge Vitals:   BP 124/82 mmHg  Pulse 73  Temp(Src) 98.2 F (36.8 C) (Oral)  Resp 16  Ht _0  (1.575 m)  Wt 94.348 kg (208 lb)  BMI 38.03 kg/m2  SpO2 100%  LMP 12/30/1994  Discharge Labs:  Results for orders placed or performed during the hospital encounter of 03/06/16 (from the past 24 hour(s))  Basic metabolic panel     Status: Abnormal   Collection Time: 03/06/16  1:45 AM  Result Value Ref Range   Sodium 141 135 - 145 mmol/L   Potassium 3.3 (L) 3.5 - 5.1 mmol/L   Chloride 105 101 - 111 mmol/L   CO2 22 22 - 32 mmol/L   Glucose, Bld 125 (H) 65 - 99 mg/dL   BUN 9 6 - 20 mg/dL   Creatinine, Ser 0.87 0.44 - 1.00 mg/dL   Calcium 9.2 8.9 - 10.3 mg/dL   GFR calc non Af Amer >60 >60 mL/min   GFR calc Af Amer >60 >60 mL/min   Anion gap 14 5 - 15  CBC     Status: None   Collection Time: 03/06/16  1:45 AM  Result Value Ref Range   WBC 7.1 4.0 - 10.5 K/uL   RBC 5.10 3.87 - 5.11 MIL/uL   Hemoglobin 14.2 12.0 - 15.0 g/dL   HCT 44.4 36.0 - 46.0 %   MCV 87.1 78.0 - 100.0 fL   MCH 27.8 26.0 - 34.0 pg   MCHC 32.0 30.0 - 36.0 g/dL   RDW 15.3 11.5 - 15.5 %   Platelets 254 150 - 400 K/uL  Brain  natriuretic peptide     Status: Abnormal   Collection Time: 03/06/16  1:45 AM  Result Value Ref  Range   B Natriuretic Peptide 209.1 (H) 0.0 - 100.0 pg/mL  Magnesium     Status: None   Collection Time: 03/06/16  1:45 AM  Result Value Ref Range   Magnesium 2.3 1.7 - 2.4 mg/dL  I-stat troponin, ED     Status: None   Collection Time: 03/06/16  1:51 AM  Result Value Ref Range   Troponin i, poc 0.02 0.00 - 0.08 ng/mL   Comment 3          Urine rapid drug screen (hosp performed)     Status: None   Collection Time: 03/06/16  5:17 AM  Result Value Ref Range   Opiates NONE DETECTED NONE DETECTED   Cocaine NONE DETECTED NONE DETECTED   Benzodiazepines NONE DETECTED NONE DETECTED   Amphetamines NONE DETECTED NONE DETECTED   Tetrahydrocannabinol NONE DETECTED NONE DETECTED   Barbiturates NONE DETECTED NONE DETECTED  Troponin I     Status: None   Collection Time: 03/06/16  8:22 AM  Result Value Ref Range   Troponin I <0.03 <0.031 ng/mL  Comprehensive metabolic panel     Status: Abnormal   Collection Time: 03/06/16  8:22 AM  Result Value Ref Range   Sodium 137 135 - 145 mmol/L   Potassium 4.2 3.5 - 5.1 mmol/L   Chloride 107 101 - 111 mmol/L   CO2 18 (L) 22 - 32 mmol/L   Glucose, Bld 103 (H) 65 - 99 mg/dL   BUN 9 6 - 20 mg/dL   Creatinine, Ser 0.71 0.44 - 1.00 mg/dL   Calcium 9.4 8.9 - 10.3 mg/dL   Total Protein 7.7 6.5 - 8.1 g/dL   Albumin 3.7 3.5 - 5.0 g/dL   AST 22 15 - 41 U/L   ALT 22 14 - 54 U/L   Alkaline Phosphatase 112 38 - 126 U/L   Total Bilirubin 0.3 0.3 - 1.2 mg/dL   GFR calc non Af Amer >60 >60 mL/min   GFR calc Af Amer >60 >60 mL/min   Anion gap 12 5 - 15  TSH     Status: None   Collection Time: 03/06/16  8:23 AM  Result Value Ref Range   TSH 1.708 0.350 - 4.500 uIU/mL  Troponin I     Status: None   Collection Time: 03/06/16  2:39 PM  Result Value Ref Range   Troponin I 0.03 <0.031 ng/mL    Signed: Shela Leff, MD 03/06/2016, 4:26 PM    Services Ordered  on Discharge:  Equipment Ordered on Discharge:

## 2016-03-06 NOTE — Consult Note (Addendum)
CARDIOLOGY CONSULT NOTE   Patient ID: Elizabeth [name] MRN: [id number] DOB/AGE: [birth year] 61 y.o.  Admit date: 03/06/2016  Primary Physician   Liberty Handy, MD Primary Cardiologist   Dr. Verl Blalock (prior); last seen by Dr. Harrington Challenger during 08/2013 admission.  Reason for Consultation   Chest pain and afib Requesting Physician  Dr. Lynnae January  HPI: Elizabeth [name] is a 60 y.o. female with a history of CAD, ischemic cardiomyopathy, hypertension, hyperlipidemia, chronic diastolic heart failure, prior history of cocaine abuse, pulmonary nodules, SLE and TIA who presented to [facility] ED for evaluation of left anterior chest pain.  She had underwent a cardiac cath in 08/2011 in the setting of NSTEMI and cocaine use. Diagnostic cardiac catheterization demonstrates chronic total occlusion of the proximal right coronary artery with collateral flow. The left circumflex coronary artery is occluded proximally with a hang-up suggesting ancute lesion. There is a long 60%-70% stenosis in the mid LAD. Subsequently s/p BMS-LCx.  Last seen cardiologist  (Dr. Harrington Challenger) during 08/2013 admission. Myoview 09/2015 showed fixed defect involving the inferolateral and anterolateral myocardium consistent with old MI. There is no reversible ischemia. There is LV systolic dysfunction (96%).  The patient had a sudden episode of left-sided "throbbing" chest pain. The pain was associated with shortness of breath and  palpitation. The pain was 10 out of 10 in intensity leading to ER presentation. Questionable similar to prior cardiac pain when she had a MI. The patient denies radiation of pain. The patient states that she intermittently feeling dizziness. She has not taken her Lasix for the past one month and then dose to having lower extremity edema. She denies any fevers, chills, diaphoresis, PND, orthopnea, syncope, abdominal pain, dysuria, focal weakness, loss of consciousness. She currently smokes 3 cigarettes a day.  Drinks 40 ounces of beer per week. No recent travel. The patient endorses to having intermittent exertional chest pain and sob ?. States better with rest.   In ED EKG shows A. fib with RVR at rate of 130 bpm and markedly ST depression in inferior lateral lead. She received 10 mg IV Diltiazem with rate improved to low 100s and subsequently converted to sinus rhythm. Repeat EKG shows sinus rhythm with diffuse T-wave inversion in inferior lateral lead which appears similar to prior EKG of 09/04/2015.  The patient is currently chest pain-free and wants to go home. Echocardiogram this admission shows left ventricular function of 50-55%, mild left ventricular hypertrophy, and grade 1 diastolic dysfunction. (All appers similar to prior echocardiogram of 2015). Chest x-ray shows increase in interstitial marking. Given IV lasix 77m x 1. Troponin I negative. TSH normal. Urine drug screen clear. Lytes normal. The patient was started on Eliquis.   Past Medical History  Diagnosis Date  . Hypertension   . HLD (hyperlipidemia)     Chol = 235, LDL = 156 (08/2010)  . Substance abuse     cocaine   . Lupus ([place] 2009    ANA + 10/2008, repeat ANA + (05/2009), on that visit 05/2009 following labs obtained RF <20, CRP <0.4, ANA titers 1:80,   . Pulmonary nodules 05/2008    noted on CXR and CT 05/2008, repeat CT 10/2008 - Stable small bilateral pulmonary nodules measuring up to 6 m m  . Insomnia   . GERD (gastroesophageal reflux disease)   . Fibroids   . History of microcytic hypochromic anemia   . Fibromyalgia   . Myocardial infarction ([place]   . CAD (coronary artery disease)  NSTEMI 08/2011:  [place] 08/21/11: mLAD 60-70%, pCFX occluded, dRCA chronic occlusion with L-R collats, EF 40-45%, inf AK.  PCI:  BMS to CFX.  Marland Kitchen Depression   . Shortness of breath 08/22/2013     Past Surgical History  Procedure Laterality Date  . Abdominal hysterectomy  05/2003  . Colonoscopy N/A 07/14/2013    Procedure: COLONOSCOPY;   Surgeon: Beryle Beams, MD;  Location: [facility];  Service: Endoscopy;  Laterality: N/A;  . Stent on chest    . Cardiac catheterization    . Orif tibia plateau Left 08/22/2013    Procedure: OPEN REDUCTION INTERNAL FIXATION (ORIF) TIBIAL PLATEAU;  Surgeon: Renette Butters, MD;  Location: [facility];  Service: Orthopedics;  Laterality: Left;    No Known Allergies  I have reviewed the patient's current medications . aspirin  81 mg Oral Daily  . atorvastatin  40 mg Oral Daily  . carvedilol  6.25 mg Oral BID  . enoxaparin (LOVENOX) injection  40 mg Subcutaneous Q24H  . lisinopril  2.5 mg Oral Daily  . pantoprazole  40 mg Oral Daily  . sertraline  50 mg Oral Daily  . sodium chloride flush  3 mL Intravenous Q12H  . Vitamin D (Ergocalciferol)  50,000 Units Oral Q7 days     acetaminophen **OR** acetaminophen, senna-docusate  Prior to Admission medications   Medication Sig Start Date End Date Taking? Authorizing Provider  aspirin 81 MG tablet Take 1 tablet (81 mg total) by mouth daily. 08/14/15  Yes Ejiroghene E Emokpae, MD  atorvastatin (LIPITOR) 40 MG tablet Take 1 tablet (40 mg total) by mouth daily. 12/04/15 12/03/16 Yes Milagros Loll, MD  carvedilol (COREG) 6.25 MG tablet Take 1 tablet (6.25 mg total) by mouth 2 (two) times daily. 12/04/15 12/03/16 Yes Milagros Loll, MD  furosemide (LASIX) 20 MG tablet Take 2 tablets (40 mg total) by mouth daily. 12/04/15  Yes Milagros Loll, MD  lisinopril (PRINIVIL,ZESTRIL) 2.5 MG tablet Take 1 tablet (2.5 mg total) by mouth daily. 12/04/15  Yes Milagros Loll, MD  pantoprazole (PROTONIX) 40 MG tablet Take 1 tablet (40 mg total) by mouth daily. 12/04/15  Yes Milagros Loll, MD  sertraline (ZOLOFT) 50 MG tablet Take 1 tablet (50 mg total) by mouth daily. 12/04/15 12/03/16 Yes Milagros Loll, MD     Social History   Social History  . Marital Status: Divorced    Spouse Name: N/A  . Number of Children: N/A  . Years of Education: N/A    Occupational History  . Not on file.   Social History Main Topics  . Smoking status: Current Some Day Smoker -- 0.10 packs/day    Types: Cigarettes  . Smokeless tobacco: Not on file     Comment: 1-2 cigs/week  . Alcohol Use: 4.2 oz/week    7 Standard drinks or equivalent per week     Comment: beer 1-2 per day  . Drug Use: No     Comment: last used cocaine less than a month ago  . Sexual Activity: Not Currently   Other Topics Concern  . Not on file   Social History Narrative    Family Status  Relation Status Death Age  . Mother Deceased   . Sister Deceased   . Brother Deceased    Family History  Problem Relation Age of Onset  . Heart disease Mother   . Cervical cancer Sister   . Prostate cancer Brother   . Colon cancer Neg Hx  ROS:  Full 14 point review of systems complete and found to be negative unless listed above.  Physical Exam: Blood pressure 156/90, pulse 71, temperature 98.5 F (36.9 C), temperature source Oral, resp. rate 19, last menstrual period 12/30/1994, SpO2 96 %.  General: Well developed, well nourished, female in no acute distress Head: Eyes PERRLA, No xanthomas. Normocephalic and atraumatic, oropharynx without edema or exudate.  Lungs: Resp regular and unlabored, CTA. Heart: RRR no s3, s4, or murmurs..   Neck: No carotid bruits. No lymphadenopathy.  JVD. Abdomen: Bowel sounds present, abdomen soft and non-tender without masses or hernias noted. Msk:  No spine or cva tenderness. No weakness, no joint deformities or effusions. Extremities: No clubbing, cyanosis or edema. DP/PT/Radials 2+ and equal bilaterally. Neuro: Alert and oriented X 3. No focal deficits noted. Psych:  Good affect, responds appropriately Skin: No rashes or lesions noted.  Labs:   Lab Results  Component Value Date   WBC 7.1 03/06/2016   HGB 14.2 03/06/2016   HCT 44.4 03/06/2016   MCV 87.1 03/06/2016   PLT 254 03/06/2016   No results for input(s): INR in the last  72 hours.  Recent Labs Lab 03/06/16 0822  NA 137  K 4.2  CL 107  CO2 18*  BUN 9  CREATININE 0.71  CALCIUM 9.4  PROT 7.7  BILITOT 0.3  ALKPHOS 112  ALT 22  AST 22  GLUCOSE 103*  ALBUMIN 3.7   MAGNESIUM  Date Value Ref Range Status  03/06/2016 2.3 1.7 - 2.4 mg/dL Final    Recent Labs  03/06/16 0822  TROPONINI <0.03    Recent Labs  03/06/16 0151  TROPIPOC 0.02   PRO B NATRIURETIC PEPTIDE (BNP)  Date/Time Value Ref Range Status  08/22/2013 06:25 PM 554.5* 0 - 125 pg/mL Final  10/29/2011 04:18 PM 235.0* 0.0 - 100.0 pg/mL Final   Lab Results  Component Value Date   CHOL 236* 08/19/2015   HDL 49 08/19/2015   LDLCALC 159* 08/19/2015   TRIG 140 08/19/2015   TSH  Date/Time Value Ref Range Status  03/06/2016 08:23 AM 1.708 0.350 - 4.500 uIU/mL Final  12/26/2014 03:12 AM 0.865 0.350 - 4.500 uIU/mL Final   Cath 08/21/2011 By Dr. Acie Fredrickson ANGIOGRAPHY: Left main: The left main is smooth and normal.  The left anterior descending artery has mild diffuse irregularities. There is a long 60%-70% stenosis in the mid-LAD. This stenosis does not appear to obstruct flow. The stenosis is somewhat calcified. There is mild diffuse disease following this stenosis.  The left circumflex artery is large branch and is occluded proximally. There is dye hang up in the left circumflex/marginal vessel.  The right coronary artery is chronically occluded. The distal right coronary artery fills via left-to-right collaterals.  The left ventriculogram was performed in a 30 RAO position. It reveals mildly depressed left ventricular systolic function with an ejection fraction of 40%-45%. She has inferior wall akinesis. The aortic root is mildly dilated.  Discussed the case with Dr. Martinique. The chest pain started about 48 hours ago and occurred when she was using cocaine. It is not clear whether opening this vessel at this point will benefit or not. We will go ahead and try  to open it in an effort to give her benefit of the doubt. She is not a candidate for drug-eluting stent so we will anticipate placing a bare metal stent for successful in getting across the stenosis. I will discuss these findings with her family.  Cath 08/21/2011  by Dr. Martinique INDICATION FOR PROCEDURE: The patient is a 60 year old black female who presented with a non-ST-elevation myocardial infarction. She has a history of cocaine abuse. She has had ongoing chest pain with ST- segment depression in the anterolateral leads. Diagnostic cardiac catheterization demonstrates chronic total occlusion of the proximal right coronary artery with collateral flow. The left circumflex coronary artery is occluded proximally with a hang-up suggesting an acute lesion. There is a long 60%-70% stenosis in the mid LAD. We recommended emergent intervention of the left circumflex coronary artery. Access was using the right femoral access from her diagnostic study. We exchanged for a 6-French sheath.  ADDITIONAL EQUIPMENTS: A 6-French left 4 guide, an Asahi medium wire, a 2.5 x 15-mm sprinter balloon, a 3.0 x 22-mm Integrity bare metal stent, and a 3.25 x 15-mm [place] Sprinter balloon.  MEDICATIONS: Angiomax 0.75 mg/kg followed by continuous effusion of 1.75 mg/kg per hour. ACT was 360. She was loaded with 600 mg of p.o. Plavix, nitroglycerin 200 mcg intracoronary x3.  After anticoagulation and initial guide shots, we crossed the lesion with moderate difficulty using the Asahi medium wire. We then predilated the lesion with a Sprinter 2.5 x 15-mm balloon to 9 atmospheres with reperfusion. We then stented the lesion using a 3.0 x 22-mm Integrity stent deploying this at 12 atmospheres. It was postdilated with a 3.25 x 15-mm [place] Sprinter balloon to 14 atmospheres x2. This yielded an excellent angiographic result with 0% residual stenosis and TIMI grade 3 flow.  We then performed femoral  angiography and closed the right femoral access using an Angio-Seal device with excellent hemostasis.  FINAL INTERPRETATION: Successful intracoronary stenting of the proximal left circumflex coronary artery using a bare metal stent.  PLAN: Continue on aspirin and Plavix.   Echo: 03/06/2016 LV EF: 50% - 55%  ------------------------------------------------------------------- Indications: Atrial fibrillation - 427.31.  ------------------------------------------------------------------- History: PMH: Lupus. Fibromyalgia. Coronary artery disease. PMH: Myocardial infarction. Risk factors: Hypertension. Dyslipidemia.  ------------------------------------------------------------------- Study Conclusions  - Left ventricle: The cavity size was normal. Wall thickness was  increased in a pattern of mild LVH. Systolic function was low  normal to mildly reduced. The estimated ejection fraction was in  the range of 50% to 55%. Wall motion was normal; there were no  regional wall motion abnormalities. Doppler parameters are  consistent with abnormal left ventricular relaxation (grade 1  diastolic dysfunction). - Aortic valve: There was no stenosis. There was trivial  regurgitation. - Mitral valve: There was trivial regurgitation. - Right ventricle: The cavity size was normal. Systolic function  was normal. - Pulmonary arteries: No complete TR doppler jet so unable to  estimate PA systolic pressure. - Inferior vena cava: The vessel was normal in size. The  respirophasic diameter changes were in the normal range (>= 50%),  consistent with normal central venous pressure.  Impressions:  - Normal LV size with mild LV hypertrophy. Low normal to mildly  decreased systolic function, EF 87-56%. Normal RV size  ECG:  EKG shows A. fib with RVR at rate of 130 bpm and markedly ST depression in inferior lateral lead.  Radiology:  Dg Chest 2 View  03/06/2016   CLINICAL DATA:  Chest pain.  Shortness of breath tonight. EXAM: CHEST  2 VIEW COMPARISON:  Radiographs 08/23/2015 FINDINGS: The lungs are hyperinflated, unchanged. Borderline cardiomegaly. Ill-defined bilateral infrahilar opacities. Increased interstitial opacities are again seen, with mild progression from prior. No pleural effusion or pneumothorax. Probable bony under mineralization. IMPRESSION: 1. Increased interstitial markings since prior exam,  may be pulmonary edema or atypical infection superimposed on chronic lung disease. Additionally there are ill-defined infrahilar opacities, vascular, atelectasis, or aspiration given distribution. Multifocal pneumonia could have a similar appearance. 2. Chronic hyperinflation. Electronically Signed   By: Jeb Levering M.D.   On: 03/06/2016 02:38    ASSESSMENT AND PLAN:    1. A. fib with RVR - New onset. He is converted to sinus rhythm on IV Cardizem. Maintaining sinus rhythm. TSH normal. Echocardiogram this admission shows left ventricular function of 50-55%, mild left ventricular hypertrophy, and grade 1 diastolic dysfunction.  - CHADSVASC score of at least 6. Started on Eliquis.   2.Chest pain - Has both typical and atypical features. Status improved with sublingual nitroglycerin x 3 in ED, however also rate improved on IV Cardizem at that time. Endores to having intermittent exertional chest discomfort and shortness of breath which is relieved with rest.  - Myoview 09/2015 showed fixed defect involving the inferolateral and anterolateral myocardium consistent with old MI. There is no reversible ischemia. Troponin negative. EKG seems without acute abnormality compared to prior EKG. -Echo during this admission is reassuring.  3. CAD s/p BMS to LCx - cath 08/2011 showed chronic total occlusion of the proximal right coronary artery with collateral flow.  There is a long 60%-70% stenosis in the mid LAD. This treated medically.  - Continue Lipitor,  Coreg, lisinopril. Discontinuation of aspirin ?  as Eliquis has started during this admission, however she does have a coronary artery disease.  4. Acute on chronic diastolic heart failure - Chest x-ray which showed increased interstitial marking. Given IV Lasix 80 mg x 1. Lungs clear to auscultation and ? Trace LE edema.  - He has not taken home dose of Lasix 40 mg daily for the past one month. Consider resuming.  5. Tobacco abuse - Advised smoking cessation completely. Education given.  6. HL - 08/19/2015: Cholesterol, Total 236*; HDL 49; LDL Calculated 159*; Triglycerides 140. LDL not at goal.  Will increase lipitor to 3m. Will need lipid panel and LFT in 6 weeks.   7. HTN - Relatively stable. Continue lisinopril 2.5 mg daily. Increased Coreg to 12.568mBID, given high rate as well.   Signed: Leanor KailPA 03/06/2016, 1:43 PM Pager [phone]-Sign MD  I have seen and examined the patient along with Bhagat,Bhavinkumar, PAEd[place] I have reviewed the chart, notes and new data.  I agree with PA's note.  Key new complaints: symptoms resolved with resolution of arrhythmia Key examination changes: no overt HF on exam, RRR Key new findings / data: ECG back to her baseline (lateral ST depression and T wave inversion), normal enzymes  PLAN: Suspect angina due to tacharrhythmia, doubt new atherothrombotic event. With chronic RCA occlusion she is sure to have some peri-infarct ischemic myocardium. Echo without new wall motion abnormalities. Recent nuclear study without any extensive reversible ischemia. Increase beta blocker for better rate control during future breakthrough arrhythmia. Antiarrhythmics not indicated at this time. If RVR occurs on higher dose carvedilol, can switch to metoprolol for better rate control with less hypotensive effect. Started on Eliquis, stop ASA. Reinforce need for medication compliance (she states she only missed furosemide, but I worry about beta blocker  noncompliance and rebound arrhythmia). Avoid smoking, stimulants.  From our point of view she can be discharged with outpt f/u.  MiSanda KleinMD, [phone]/21/2017, 3:06 PM

## 2016-03-06 NOTE — H&P (Signed)
Date: 03/06/2016               Patient Name:  Elizabeth [name] MRN: [id number]  DOB: [birth year] Age / Sex: 60 y.o., female   PCP: Liberty Handy, MD         Medical Service: Internal Medicine Teaching Service         Attending Physician: Dr. Bartholomew Crews, MD    First Contact: Dr. Berline Lopes Pager: [phone]  Second Contact: Dr. Albin Felling Pager: [phone]       After Hours (After 5p/  First Contact Pager: [phone]  weekends / holidays): Second Contact Pager: [phone]   Chief Complaint: Chest pain  History of Present Illness: Elizabeth [name] is a 60 year old female with PMH of 2 MIs (NSTEMI 2012), CAD s/p PCI with BMS in 2013, dCHF (EF 55-60% in 04/2014), HTN, HLD, GERD, cocaine abuse, and TIA who presents with chest pain. Patient's pain began 10 pm last night suddenly while she was resting in bed. It was located across her left chest, described as a heavy ache, "20/10" in intensity, with associated numbness in her left fingers, palpitations, SOB, nausea, and vomiting. She initially thought this was heartburn as she had just eaten nacho cheese, although she felt this was similar to her prior heart attack pain. She tried her acid reflux medication without relief. She says the pain did not radiate to her neck, jaw, or back. She denies any fevers, chills, diaphoresis, abdominal pain, dysuria, focal weakness, loss of consciousness. She reports a chronic cough with occasional blood tinged sputum production.  Patient states that for the last month she has increased dyspnea on exertion, orthopnea (usually sleeps with 2 thick pillows below head), lower extremity swelling, weight gain, and fatigue. She says she has been out of her home Lasix for 1 month due to difficulty affording it.  ED vitals: BP 143/99 mmHg  Pulse 105  Temp(Src) 98.5 F (36.9 C) (Oral)  Resp 25  SpO2 94% RA  In the ED, patient was noted to be in Atrial Fibrillation with RVR rate in the 130s. She received 10 mg  IV Diltiazem with rate improved to low 100s. She was given ASA 162 mg once and Nitroglycerin x 3 with improvement in her left sided chest pain. I-stat troponin was 0.02.  When seen by [place] in the ED, patient reported her severe chest pain is much improved, but now has her typical heartburn symptoms.  Social Hx: Smokes ~ 4 cigs/day since age 62, ~40 oz EtOH per week, history of cocaine use (last use in November 2016)  Family Hx: Heart disease mother   Meds: Current Facility-Administered Medications  Medication Dose Route Frequency Provider Last Rate Last Dose  . acetaminophen (TYLENOL) tablet 650 mg  650 mg Oral Q6H PRN Juluis Mire, MD       Or  . acetaminophen (TYLENOL) suppository 650 mg  650 mg Rectal Q6H PRN Juluis Mire, MD      . aspirin tablet 81 mg  81 mg Oral Daily Marjan Rabbani, MD      . atorvastatin (LIPITOR) tablet 40 mg  40 mg Oral Daily Marjan Rabbani, MD      . carvedilol (COREG) tablet 6.25 mg  6.25 mg Oral BID Marjan Rabbani, MD      . enoxaparin (LOVENOX) injection 40 mg  40 mg Subcutaneous Q24H Marjan Rabbani, MD      . lisinopril (PRINIVIL,ZESTRIL) tablet 2.5 mg  2.5 mg Oral Daily Marjan Rabbani,  MD      . pantoprazole (PROTONIX) EC tablet 40 mg  40 mg Oral Daily Marjan Rabbani, MD   40 mg at 03/06/16 0452  . senna-docusate (Senokot-S) tablet 1 tablet  1 tablet Oral QHS PRN Juluis Mire, MD      . sertraline (ZOLOFT) tablet 50 mg  50 mg Oral Daily Marjan Rabbani, MD      . sodium chloride flush (NS) 0.9 % injection 3 mL  3 mL Intravenous Q12H Marjan Rabbani, MD   3 mL at 03/06/16 0345  . Vitamin D (Ergocalciferol) (DRISDOL) capsule 50,000 Units  50,000 Units Oral Q7 days Juluis Mire, MD       Current Outpatient Prescriptions  Medication Sig Dispense Refill  . aspirin 81 MG tablet Take 1 tablet (81 mg total) by mouth daily. 30 tablet 11  . atorvastatin (LIPITOR) 40 MG tablet Take 1 tablet (40 mg total) by mouth daily. 30 tablet 11  . carvedilol (COREG) 6.25  MG tablet Take 1 tablet (6.25 mg total) by mouth 2 (two) times daily. 60 tablet 5  . furosemide (LASIX) 20 MG tablet Take 2 tablets (40 mg total) by mouth daily. 30 tablet 5  . lisinopril (PRINIVIL,ZESTRIL) 2.5 MG tablet Take 1 tablet (2.5 mg total) by mouth daily. 30 tablet 5  . pantoprazole (PROTONIX) 40 MG tablet Take 1 tablet (40 mg total) by mouth daily. 30 tablet 5  . sertraline (ZOLOFT) 50 MG tablet Take 1 tablet (50 mg total) by mouth daily. 30 tablet 2    Allergies: Allergies as of 03/06/2016  . (No Known Allergies)   Past Medical History  Diagnosis Date  . Hypertension   . HLD (hyperlipidemia)     Chol = 235, LDL = 156 (08/2010)  . Substance abuse     cocaine   . Lupus ([place]) 2009    ANA + 10/2008, repeat ANA + (05/2009), on that visit 05/2009 following labs obtained RF <20, CRP <0.4, ANA titers 1:80,   . Pulmonary nodules 05/2008    noted on CXR and CT 05/2008, repeat CT 10/2008 - Stable small bilateral pulmonary nodules measuring up to 6 m m  . Insomnia   . GERD (gastroesophageal reflux disease)   . Fibroids   . History of microcytic hypochromic anemia   . Fibromyalgia   . Myocardial infarction ([place])   . CAD (coronary artery disease)     NSTEMI 08/2011:  LHC 08/21/11: mLAD 60-70%, pCFX occluded, dRCA chronic occlusion with L-R collats, EF 40-45%, inf AK.  PCI:  BMS to CFX.  Marland Kitchen Depression   . Shortness of breath 08/22/2013   Past Surgical History  Procedure Laterality Date  . Abdominal hysterectomy  05/2003  . Colonoscopy N/A 07/14/2013    Procedure: COLONOSCOPY;  Surgeon: Beryle Beams, MD;  Location: [facility];  Service: Endoscopy;  Laterality: N/A;  . Stent on chest    . Cardiac catheterization    . Orif tibia plateau Left 08/22/2013    Procedure: OPEN REDUCTION INTERNAL FIXATION (ORIF) TIBIAL PLATEAU;  Surgeon: Renette Butters, MD;  Location: [facility];  Service: Orthopedics;  Laterality: Left;   Family History  Problem Relation Age of Onset  . Heart disease  Mother   . Cervical cancer Sister   . Prostate cancer Brother   . Colon cancer Neg Hx    Social History   Social History  . Marital Status: Divorced    Spouse Name: N/A  . Number of Children: N/A  . Years  of Education: N/A   Occupational History  . Not on file.   Social History Main Topics  . Smoking status: Current Some Day Smoker -- 0.10 packs/day    Types: Cigarettes  . Smokeless tobacco: Not on file     Comment: 1-2 cigs/week  . Alcohol Use: 4.2 oz/week    7 Standard drinks or equivalent per week     Comment: beer 1-2 per day  . Drug Use: No     Comment: last used cocaine less than a month ago  . Sexual Activity: Not Currently   Other Topics Concern  . Not on file   Social History Narrative    Review of Systems: Review of Systems  Constitutional: Negative for fever, chills, weight loss and diaphoresis.  Eyes: Positive for blurred vision.  Respiratory: Positive for cough and shortness of breath. Negative for wheezing.   Cardiovascular: Positive for chest pain, palpitations, orthopnea and leg swelling. Negative for claudication and PND.  Gastrointestinal: Positive for heartburn, nausea, vomiting and diarrhea. Negative for abdominal pain and constipation.  Genitourinary: Negative for dysuria.  Musculoskeletal: Negative for falls.  Neurological: Positive for tingling. Negative for dizziness, seizures, loss of consciousness and headaches.  Psychiatric/Behavioral: Negative for substance abuse.     Physical Exam: Blood pressure 124/87, pulse 73, temperature 98.5 F (36.9 C), temperature source Oral, resp. rate 18, last menstrual period 12/30/1994, SpO2 95 %. Physical Exam  Constitutional: She is oriented to person, place, and time. She appears well-developed and well-nourished. No distress.  HENT:  Head: Normocephalic and atraumatic.  Mouth/Throat: Oropharynx is clear and moist.  Eyes: EOM are normal. Pupils are equal, round, and reactive to light.  Neck: Neck  supple.  Cardiovascular: Normal rate, regular rhythm and intact distal pulses.  Exam reveals no gallop and no friction rub.   No murmur heard. Pulmonary/Chest: Effort normal. No respiratory distress. She has no wheezes. She has rales. She exhibits no tenderness.  Bibasilar rales  Abdominal: Soft. Bowel sounds are normal. She exhibits no distension. There is no tenderness. There is no guarding.  Obese abdomen  Musculoskeletal: Normal range of motion. She exhibits no tenderness.  Trace edema bilateral lower extremities  Neurological: She is alert and oriented to person, place, and time.  Skin: Skin is warm. She is not diaphoretic.  Psychiatric: She has a normal mood and affect.     Lab results: Basic Metabolic Panel:  Recent Labs  03/06/16 0145  NA 141  K 3.3*  CL 105  CO2 22  GLUCOSE 125*  BUN 9  CREATININE 0.87  CALCIUM 9.2  MG 2.3   Liver Function Tests: No results for input(s): AST, ALT, ALKPHOS, BILITOT, PROT, ALBUMIN in the last 72 hours. No results for input(s): LIPASE, AMYLASE in the last 72 hours. No results for input(s): AMMONIA in the last 72 hours. CBC:  Recent Labs  03/06/16 0145  WBC 7.1  HGB 14.2  HCT 44.4  MCV 87.1  PLT 254   Cardiac Enzymes: No results for input(s): CKTOTAL, CKMB, CKMBINDEX, TROPONINI in the last 72 hours. BNP: No results for input(s): PROBNP in the last 72 hours. D-Dimer: No results for input(s): DDIMER in the last 72 hours. CBG: No results for input(s): GLUCAP in the last 72 hours. Hemoglobin A1C: No results for input(s): HGBA1C in the last 72 hours. Fasting Lipid Panel: No results for input(s): CHOL, HDL, LDLCALC, TRIG, CHOLHDL, LDLDIRECT in the last 72 hours. Thyroid Function Tests: No results for input(s): TSH, T4TOTAL, FREET4, T3FREE, THYROIDAB  in the last 72 hours. Anemia Panel: No results for input(s): VITAMINB12, FOLATE, FERRITIN, TIBC, IRON, RETICCTPCT in the last 72 hours. Coagulation: No results for input(s):  LABPROT, INR in the last 72 hours. Urine Drug Screen: Drugs of Abuse     Component Value Date/Time   LABOPIA NONE DETECTED 05/09/2014 1644   COCAINSCRNUR NONE DETECTED 05/09/2014 1644   LABBENZ NONE DETECTED 05/09/2014 1644   AMPHETMU NONE DETECTED 05/09/2014 1644   THCU NONE DETECTED 05/09/2014 1644   LABBARB NONE DETECTED 05/09/2014 1644    Alcohol Level: No results for input(s): ETH in the last 72 hours. Urinalysis: No results for input(s): COLORURINE, LABSPEC, PHURINE, GLUCOSEU, HGBUR, BILIRUBINUR, KETONESUR, PROTEINUR, UROBILINOGEN, NITRITE, LEUKOCYTESUR in the last 72 hours.  Invalid input(s): APPERANCEUR  Imaging results:  Dg Chest 2 View  03/06/2016  CLINICAL DATA:  Chest pain.  Shortness of breath tonight. EXAM: CHEST  2 VIEW COMPARISON:  Radiographs 08/23/2015 FINDINGS: The lungs are hyperinflated, unchanged. Borderline cardiomegaly. Ill-defined bilateral infrahilar opacities. Increased interstitial opacities are again seen, with mild progression from prior. No pleural effusion or pneumothorax. Probable bony under mineralization. IMPRESSION: 1. Increased interstitial markings since prior exam, may be pulmonary edema or atypical infection superimposed on chronic lung disease. Additionally there are ill-defined infrahilar opacities, vascular, atelectasis, or aspiration given distribution. Multifocal pneumonia could have a similar appearance. 2. Chronic hyperinflation. Electronically Signed   By: Jeb Levering M.D.   On: 03/06/2016 02:38    Other results: EKG: Afib RVR rate 133, ST depressions II, III, aVF, V4-V6, occasional PVCs  Assessment & Plan by Problem: Principal Problem:   Chest pain Active Problems:   Dyslipidemia   Anxiety and depression   Essential hypertension   Coronary atherosclerosis   LUPUS   Prediabetes   Acute on chronic diastolic (congestive) heart failure (HCC)   Tobacco abuse   Vitamin D deficiency   History of cocaine abuse   Pulmonary  edema  60 year old female with PMH of 2 MIs (NSTEMI 2012), CAD s/p PCI with BMS in 2013, dCHF (EF 55-60% in 04/2014), HTN, HLD, GERD, cocaine abuse, and TIA who presents with chest pain.   Chest Pain: Patient with left sided chest pain at rest improved with nitroglycerin with initial troponin of 0.02. Initial EKG showed Afib with RVR and ST depressions in the inferior and lateral leads concerning for ACS. She does report worsening symptoms of DOE, SOB, orthopnea for the last month while not taking her home lasix. CXR did show increased interstitial changes suggestive of pulmonary edema. She probably had slowly worsening of her diastolic heart failure which led to pulmonary edema and set off her Atrial fibrillation which in turn caused her chest pain. She does have a history of MI and CAD which certainly is playing a role as well. She received IV diltiazem which improved her rate and subsequent EKG did show return to sinus rhythm and some improvement with regards to her ST depression. She is afebrile, without leukocytosis, and my suspicion for pneumonia is low. If she has recurrence or worsening of symptoms, would consider PE as another possibility (PE clinical probability score of 2.5 moderate risk if hemoptysis used in calculation). She will likely need cardiac monitoring with outpatient holter or loop and may need inpatient stress test as well. We will admit for chest pain rule out and start diureses, monitor on telemetry, trend troponins, and ask Cardiology to evaluate patient for further recommendations.  -Consult Cardiology in AM -Trend Troponin -Repeat EKG -Admit to  telemetry   Acute on Chronic Diastolic CHF: As mentioned above, this may have triggered her Afib and chest pain. TTE in July 2015 showed an EF of 55-60% and grade 1 diastolic dysfunction (grade 2 on prior). Last BNP from July 2016 was 155.2. Bibasilar crackles and trace pedal edema noted on exam. Weight not significantly changed from  prior. -IV Lasix 80 mg once -TTE -BNP -Carvedilol 6.25 mg po BID -BMP, Mg -I/O, daily weight   Transient Afib: Converted to sinus rhythm after Diltiazem given. She denies any history of prior Atrial Fibrillation. Will monitor on telemetry with possible outpatient event monitor. If considered paroxysmal atrial fibrillation, will need to consider long-term anticoagulation. She would have a CHADSVASc score of 6 (yearly stroke risk 9.8%). -Cardiac monitoring -TTE -TSH, Hgb A1C -Consider outpatient cardiac event monitoring   CAD s/p BMS: NSTEMI in 2012, previously on Aspirin and Plavix, now only on Aspirin. On high intensity statin.  -Atrovastatin 40 mg -Aspirin 81 mg -Sublingual Nitroglycerin prn  Hx of Cocaine use: Patient reports last use in November 2016. -UDS  HTN: -Lisinopril 2.5 mg po daily -Carvedilol 6.25 mg po BID  HLD: -Atrovastatin 40 mg  GERD: -Protonix 40 po qd  Depression: -Sertraline 50 mg daily  Diet: NPO for now   DVT ppx: Lovenox   Code: FULL    Dispo: Disposition is deferred at this time, awaiting improvement of current medical problems. Anticipated discharge in approximately 1-2 day(s).   The patient does have a current PCP Liberty Handy, MD) and does need an [facility] hospital follow-up appointment after discharge.  The patient does have transportation limitations that hinder transportation to clinic appointments.  Signed: Zada Finders, MD 03/06/2016, 5:55 AM

## 2016-03-06 NOTE — ED Notes (Signed)
[facility] ordered for patient. Pt updated.

## 2016-03-06 NOTE — Care Management Note (Addendum)
Case Management Note  Patient Details  Name: Elizabeth [name] MRN: [id number] Date of Birth: 10-23-1956  Subjective/Objective:    Consult received for Eliquis. Benefits Check in process and will make pt aware once completed.                Action/Plan: Pt has 30 day free card. CM will continue to monitor for additional needs.    Expected Discharge Date:                  Expected Discharge Plan:  Home/Self Care  In-House Referral:  NA  Discharge planning Services  CM Consult, Medication Assistance  Post Acute Care Choice:  NA Choice offered to:  NA  DME Arranged:  N/A DME Agency:  NA  HH Arranged:  NA HH Agency:  NA  Status of Service:  Completed, signed off  Medicare Important Message Given:    Date Medicare IM Given:    Medicare IM give by:    Date Additional Medicare IM Given:    Additional Medicare Important Message give by:     If discussed at [place] of Stay Meetings, dates discussed:    Additional Comments: 1623 03-06-16 [name], RN,BSN [phone] Pt has 30 day free card. Pt should not have to pay anymore than $3.50 for medications. She usually pays $1.50 for medications. No further needs from CM at this time.   [name], RN 03/06/2016, 3:05 PM

## 2016-03-06 NOTE — ED Provider Notes (Signed)
CSN: [id number]     Arrival date & time 03/06/16  0116 History  By signing my name below, I, Altamease Oiler, attest that this documentation has been prepared under the direction and in the presence of Everlene Balls, MD. Electronically Signed: Altamease Oiler, ED Scribe. 03/06/2016. 2:09 AM   Chief Complaint  Patient presents with  . Chest Pain   The history is provided by the patient. No language interpreter was used.   Elizabeth [name] is a 60 y.o. female with history of MI x2, HTN, HLD, and cocaine abuse who presents to the Emergency Department complaining of constant, 10/10 in severity, central chest pain with onset 3-4 hours ago. Initially she thought that her pain was related to acid reflux after eating nachos but she had no relief after a dose of an antacid. She had similar pain with previous heart attacks. Associated symptoms include an episode of emesis. Pt denies SOB. She takes aspirin daily and took another dose after the onset of pain. Pt is not on another blood thinner.   Past Medical History  Diagnosis Date  . Hypertension   . HLD (hyperlipidemia)     Chol = 235, LDL = 156 (08/2010)  . Substance abuse     cocaine   . Lupus ([place]) 2009    ANA + 10/2008, repeat ANA + (05/2009), on that visit 05/2009 following labs obtained RF <20, CRP <0.4, ANA titers 1:80,   . Pulmonary nodules 05/2008    noted on CXR and CT 05/2008, repeat CT 10/2008 - Stable small bilateral pulmonary nodules measuring up to 6 m m  . Insomnia   . GERD (gastroesophageal reflux disease)   . Fibroids   . History of microcytic hypochromic anemia   . Fibromyalgia   . Myocardial infarction ([place])   . CAD (coronary artery disease)     NSTEMI 08/2011:  LHC 08/21/11: mLAD 60-70%, pCFX occluded, dRCA chronic occlusion with L-R collats, EF 40-45%, inf AK.  PCI:  BMS to CFX.  Marland Kitchen Depression   . Shortness of breath 08/22/2013   Past Surgical History  Procedure Laterality Date  . Abdominal hysterectomy  05/2003  .  Colonoscopy N/A 07/14/2013    Procedure: COLONOSCOPY;  Surgeon: Beryle Beams, MD;  Location: [facility];  Service: Endoscopy;  Laterality: N/A;  . Stent on chest    . Cardiac catheterization    . Orif tibia plateau Left 08/22/2013    Procedure: OPEN REDUCTION INTERNAL FIXATION (ORIF) TIBIAL PLATEAU;  Surgeon: Renette Butters, MD;  Location: [facility];  Service: Orthopedics;  Laterality: Left;   Family History  Problem Relation Age of Onset  . Heart disease Mother   . Cervical cancer Sister   . Prostate cancer Brother   . Colon cancer Neg Hx    Social History  Substance Use Topics  . Smoking status: Current Some Day Smoker -- 0.10 packs/day    Types: Cigarettes  . Smokeless tobacco: None     Comment: 1-2 cigs/week  . Alcohol Use: 4.2 oz/week    7 Standard drinks or equivalent per week     Comment: beer 1-2 per day   OB History    No data available     Review of Systems  10 Systems reviewed and all are negative for acute change except as noted in the HPI.  Allergies  Review of patient's allergies indicates no known allergies.  Home Medications   Prior to Admission medications   Medication Sig Start Date End Date  Taking? Authorizing Provider  aspirin 81 MG tablet Take 1 tablet (81 mg total) by mouth daily. 08/14/15   Ejiroghene Arlyce Dice, MD  atorvastatin (LIPITOR) 40 MG tablet Take 1 tablet (40 mg total) by mouth daily. 12/04/15 12/03/16  Milagros Loll, MD  carvedilol (COREG) 6.25 MG tablet Take 1 tablet (6.25 mg total) by mouth 2 (two) times daily. 12/04/15 12/03/16  Milagros Loll, MD  furosemide (LASIX) 20 MG tablet Take 2 tablets (40 mg total) by mouth daily. 12/04/15   Milagros Loll, MD  lisinopril (PRINIVIL,ZESTRIL) 2.5 MG tablet Take 1 tablet (2.5 mg total) by mouth daily. 12/04/15   Milagros Loll, MD  pantoprazole (PROTONIX) 40 MG tablet Take 1 tablet (40 mg total) by mouth daily. 12/04/15   Milagros Loll, MD  sertraline (ZOLOFT) 50 MG tablet Take 1 tablet (50  mg total) by mouth daily. 12/04/15 12/03/16  Milagros Loll, MD   BP 143/99 mmHg  Pulse 105  Temp(Src) 98.5 F (36.9 C) (Oral)  Resp 25  SpO2 94%  LMP 12/30/1994 Physical Exam  Constitutional: She is oriented to person, place, and time. She appears well-developed and well-nourished. She appears distressed.  HENT:  Head: Normocephalic and atraumatic.  Nose: Nose normal.  Mouth/Throat: Oropharynx is clear and moist. No oropharyngeal exudate.  Eyes: Conjunctivae and EOM are normal. Pupils are equal, round, and reactive to light. No scleral icterus.  Neck: Normal range of motion. Neck supple. No JVD present. No tracheal deviation present. No thyromegaly present.  Cardiovascular: Normal heart sounds.  An irregularly irregular rhythm present. Tachycardia present.  Exam reveals no gallop and no friction rub.   No murmur heard. Pulmonary/Chest: Effort normal and breath sounds normal. No respiratory distress. She has no wheezes. She exhibits no tenderness.  Abdominal: Soft. Bowel sounds are normal. She exhibits no distension and no mass. There is no tenderness. There is no rebound and no guarding.  Musculoskeletal: Normal range of motion. She exhibits no edema or tenderness.  Lymphadenopathy:    She has no cervical adenopathy.  Neurological: She is alert and oriented to person, place, and time. No cranial nerve deficit. She exhibits normal muscle tone.  Skin: Skin is warm and dry. No rash noted. No erythema. No pallor.  Nursing note and vitals reviewed.   ED Course  Procedures (including critical care time) DIAGNOSTIC STUDIES: Oxygen Saturation is 94% on RA, adequate by my interpretation.    COORDINATION OF CARE: 2:04 AM Discussed treatment plan which includes lab work, CXR, EKG with pt at bedside and pt agreed to plan.  Labs Review Labs Reviewed  BASIC METABOLIC PANEL - Abnormal; Notable for the following:    Potassium 3.3 (*)    Glucose, Bld 125 (*)    All other components within  normal limits  CBC  I-STAT TROPOININ, ED    Imaging Review Dg Chest 2 View  03/06/2016  CLINICAL DATA:  Chest pain.  Shortness of breath tonight. EXAM: CHEST  2 VIEW COMPARISON:  Radiographs 08/23/2015 FINDINGS: The lungs are hyperinflated, unchanged. Borderline cardiomegaly. Ill-defined bilateral infrahilar opacities. Increased interstitial opacities are again seen, with mild progression from prior. No pleural effusion or pneumothorax. Probable bony under mineralization. IMPRESSION: 1. Increased interstitial markings since prior exam, may be pulmonary edema or atypical infection superimposed on chronic lung disease. Additionally there are ill-defined infrahilar opacities, vascular, atelectasis, or aspiration given distribution. Multifocal pneumonia could have a similar appearance. 2. Chronic hyperinflation. Electronically Signed   By: Fonnie Birkenhead.D.  On: 03/06/2016 02:38   I have personally reviewed and evaluated these images and lab results as part of my medical decision-making.   EKG Interpretation   Date/Time:  Friday March 06 2016 01:37:37 EDT Ventricular Rate:  133 PR Interval:    QRS Duration: 88 QT Interval:  354 QTC Calculation: 526 R Axis:   -9 Text Interpretation:  Atrial fibrillation with rapid ventricular response  with premature ventricular or aberrantly conducted complexes Marked ST  abnormality, possible inferior subendocardial injury Abnormal ECG  Confirmed by Glynn Octave [phone]) on 03/06/2016 1:42:33 AM      MDM   Final diagnoses:  None    Patient presents emergency department for chest pain. She also has atrial fibrillation and was in a rapid ventricular rate. She was given 10 mg of IV diltiazem which decreased her rate down to 100. Troponin is negative however patient states this is consistent with her pain she's had a prior heart attacks. Chest x-ray shows possible multifocal pneumonia, however patient's history is not consistent with this. Will  admit to internal medicine teaching service for cardiac rule out.  3:49 AM I spoke with int med resident who agrees to admit the patient for further care.   I personally performed the services described in this documentation, which was scribed in my presence. The recorded information has been reviewed and is accurate.      Everlene Balls, MD 03/06/16 [phone]

## 2016-03-06 NOTE — ED Notes (Signed)
Pt made aware of need for urine sample.  States she does not feel she can urinate at this time

## 2016-03-06 NOTE — Progress Notes (Addendum)
Subjective: Patient denies having any CP, palpitations, or SOB. No other complaints.   Objective: Vital signs in last 24 hours: Filed Vitals:   03/06/16 0935 03/06/16 1205 03/06/16 1400 03/06/16 1445  BP: 142/97 156/90 155/88 124/82  Pulse: 73 71 72 73  Temp:    98.2 F (36.8 C)  TempSrc:    Oral  Resp: _0 Height:    _1  (1.575 m)  Weight:    94.348 kg (208 lb)  SpO2: 97% 96% 97% 100%   Weight change:   Intake/Output Summary (Last 24 hours) at 03/06/16 1612 Last data filed at 03/06/16 1038  Gross per 24 hour  Intake      0 ml  Output    800 ml  Net   -800 ml   Physical Exam: Constitutional: No distress.  Eyes: EOM are normal.  Cardiovascular: Normal rate, regular rhythm and intact distal pulses. Exam reveals no gallop and no friction rub.  No murmur heard. Pulmonary/Chest: Effort normal. Lungs CTAB. No respiratory distress. She has no wheezes. She exhibits no tenderness.  Abdominal: Soft. Bowel sounds are normal. She exhibits no distension. There is no tenderness. There is no guarding.  Musculoskeletal: Normal range of motion. She exhibits no tenderness.  Trace edema bilateral lower extremities  Neurological: She is alert and oriented to person, place, and time.  Skin: Skin is warm. She is not diaphoretic.  Psychiatric: She has a normal mood and affect.   Lab Results: Basic Metabolic Panel:  Recent Labs Lab 03/06/16 0145 03/06/16 0822  NA 141 137  K 3.3* 4.2  CL 105 107  CO2 22 18*  GLUCOSE 125* 103*  BUN 9 9  CREATININE 0.87 0.71  CALCIUM 9.2 9.4  MG 2.3  --    Liver Function Tests:  Recent Labs Lab 03/06/16 0822  AST 22  ALT 22  ALKPHOS 112  BILITOT 0.3  PROT 7.7  ALBUMIN 3.7   No results for input(s): LIPASE, AMYLASE in the last 168 hours. No results for input(s): AMMONIA in the last 168 hours. CBC:  Recent Labs Lab 03/06/16 0145  WBC 7.1  HGB 14.2  HCT 44.4  MCV 87.1  PLT 254   Cardiac Enzymes:  Recent Labs Lab  03/06/16 0822 03/06/16 1439  TROPONINI <0.03 0.03   BNP: No results for input(s): PROBNP in the last 168 hours. D-Dimer: No results for input(s): DDIMER in the last 168 hours. CBG: No results for input(s): GLUCAP in the last 168 hours. Hemoglobin A1C: No results for input(s): HGBA1C in the last 168 hours. Fasting Lipid Panel: No results for input(s): CHOL, HDL, LDLCALC, TRIG, CHOLHDL, LDLDIRECT in the last 168 hours. Thyroid Function Tests:  Recent Labs Lab 03/06/16 0823  TSH 1.708   Coagulation: No results for input(s): LABPROT, INR in the last 168 hours. Anemia Panel: No results for input(s): VITAMINB12, FOLATE, FERRITIN, TIBC, IRON, RETICCTPCT in the last 168 hours. Urine Drug Screen: Drugs of Abuse     Component Value Date/Time   LABOPIA NONE DETECTED 03/06/2016 0517   COCAINSCRNUR NONE DETECTED 03/06/2016 0517   LABBENZ NONE DETECTED 03/06/2016 0517   AMPHETMU NONE DETECTED 03/06/2016 0517   THCU NONE DETECTED 03/06/2016 0517   LABBARB NONE DETECTED 03/06/2016 0517    Alcohol Level: No results for input(s): ETH in the last 168 hours. Urinalysis: No results for input(s): COLORURINE, LABSPEC, PHURINE, GLUCOSEU, HGBUR, BILIRUBINUR, KETONESUR, PROTEINUR, UROBILINOGEN, NITRITE, LEUKOCYTESUR in the last 168 hours.  Invalid input(s): APPERANCEUR  Misc. Labs:   Micro Results: No results found for this or any previous visit (from the past 240 hour(s)). Studies/Results: Dg Chest 2 View  03/06/2016  CLINICAL DATA:  Chest pain.  Shortness of breath tonight. EXAM: CHEST  2 VIEW COMPARISON:  Radiographs 08/23/2015 FINDINGS: The lungs are hyperinflated, unchanged. Borderline cardiomegaly. Ill-defined bilateral infrahilar opacities. Increased interstitial opacities are again seen, with mild progression from prior. No pleural effusion or pneumothorax. Probable bony under mineralization. IMPRESSION: 1. Increased interstitial markings since prior exam, may be pulmonary edema or  atypical infection superimposed on chronic lung disease. Additionally there are ill-defined infrahilar opacities, vascular, atelectasis, or aspiration given distribution. Multifocal pneumonia could have a similar appearance. 2. Chronic hyperinflation. Electronically Signed   By: Jeb Levering M.D.   On: 03/06/2016 02:38   Medications: I have reviewed the patient's current medications. Scheduled Meds: . apixaban  5 mg Oral BID  . [START ON 03/07/2016] atorvastatin  80 mg Oral Q supper  . carvedilol  12.5 mg Oral BID AC  . lisinopril  2.5 mg Oral Daily  . pantoprazole  40 mg Oral Daily  . sertraline  50 mg Oral Daily  . sodium chloride flush  3 mL Intravenous Q12H  . Vitamin D (Ergocalciferol)  50,000 Units Oral Q7 days   Continuous Infusions:  PRN Meds:.acetaminophen **OR** acetaminophen, senna-docusate Assessment/Plan: Principal Problem:   Chest pain Active Problems:   Dyslipidemia   Anxiety and depression   Essential hypertension   Coronary atherosclerosis   LUPUS   Prediabetes   Acute on chronic diastolic (congestive) heart failure (HCC)   Tobacco abuse   Vitamin D deficiency   History of cocaine abuse   Pulmonary edema  Chest Pain with both typical and atypical features Likely due to tachyarrhythmia. Troponin x2 negative. EKG seems to be without an acute abnormality in comaprison to prior EKG. Echo showing mild LVH, LVEF 50-55%, and grade 1 diastolic dysfunction. RV size and systolic function is normal. No significant valvular abnormalities.  -Cardiology following; appreciate recs  -Trend Troponin  Acute on Chronic Diastolic CHF: May have triggered her Afib and chest pain. BNP 209. Echo showing mild LVH, LVEF 50-55%, and grade 1 diastolic dysfunction. Lungs clear on exam. Trace bilateral lower extremity edema. She was given IV Lasix 80 mg once.  -Carvedilol increased to 12.5 mg po BID -Start Lasix 40 mg daily tomorrow  -I/O, daily weight  Transient Afib: Converted to  sinus rhythm after Diltiazem given. CHADSVASc score of 6 (yearly stroke risk 9.8%). Echo showing mild LVH, LVEF 50-55%, and grade 1 diastolic dysfunction. TSH normal.  -Cardiac monitoring  -Started on Eliquis  -Hgb A1C pending   CAD s/p BMS: NSTEMI in 2012, previously on Aspirin and Plavix, now only on Aspirin. Cath 08/2011 showed chronic total occlusion of the proximal right coronary artery with collateral flow.There is a long 60%-70% stenosis in the mid LAD. Recent nuclear study without any extensive reversible ischemia. Cardiology recommending medical mgmt.  -Atrovastatin increased to 80 mg daily  -Carvedilol increased to 12.5 mg po BID -Lisinopril 2.5 mg daily  -started on Eliquis  -Stop Aspirin  -Sublingual Nitroglycerin prn  Hx of Cocaine use: Patient reports last use in November 2016. UDS negative.   HTN: -Lisinopril 2.5 mg po daily -Carvedilol increased to 12.5 mg po BID -Lasix 40 mg daily   HLD: -Atrovastatin 40 mg  GERD: -Protonix 40 po qd  Depression: -Sertraline 50 mg daily  Diet: Heart healthy   DVT ppx: Eliquis  Dispo: Disposition is deferred at this time, awaiting improvement of current medical problems.  Anticipated discharge in approximately  day(s).   The patient does have a current PCP Liberty Handy, MD) and does need an [facility] hospital follow-up appointment after discharge.  The patient does not have transportation limitations that hinder transportation to clinic appointments.  .Services Needed at time of discharge: Y = Yes, Blank = No PT:   OT:   RN:   Equipment:   Other:       Shela Leff, MD 03/06/2016, 4:12 PM

## 2016-03-06 NOTE — Progress Notes (Signed)
Patient discharged home with discharge instructions and eliquis card for 30 days. No questions at this time.

## 2016-03-07 ENCOUNTER — Other Ambulatory Visit: Payer: Self-pay | Admitting: Internal Medicine

## 2016-03-07 LAB — HEMOGLOBIN A1C
HEMOGLOBIN A1C: 6 % — AB (ref 4.8–5.6)
Mean Plasma Glucose: 126 mg/dL

## 2016-03-09 ENCOUNTER — Telehealth: Payer: Self-pay | Admitting: Cardiovascular Disease

## 2016-03-09 NOTE — Telephone Encounter (Signed)
Closed encounter

## 2016-03-11 ENCOUNTER — Encounter: Payer: Self-pay | Admitting: Internal Medicine

## 2016-03-11 ENCOUNTER — Ambulatory Visit (INDEPENDENT_AMBULATORY_CARE_PROVIDER_SITE_OTHER): Payer: Medicare Other | Admitting: Internal Medicine

## 2016-03-11 VITALS — BP 159/88 | HR 60 | Temp 97.9°F | Ht 62.0 in | Wt 207.1 lb

## 2016-03-11 DIAGNOSIS — Z09 Encounter for follow-up examination after completed treatment for conditions other than malignant neoplasm: Secondary | ICD-10-CM

## 2016-03-11 DIAGNOSIS — Z8673 Personal history of transient ischemic attack (TIA), and cerebral infarction without residual deficits: Secondary | ICD-10-CM | POA: Diagnosis not present

## 2016-03-11 DIAGNOSIS — I4891 Unspecified atrial fibrillation: Secondary | ICD-10-CM | POA: Diagnosis present

## 2016-03-11 DIAGNOSIS — R079 Chest pain, unspecified: Secondary | ICD-10-CM

## 2016-03-11 DIAGNOSIS — Z7901 Long term (current) use of anticoagulants: Secondary | ICD-10-CM | POA: Diagnosis not present

## 2016-03-11 DIAGNOSIS — Z8679 Personal history of other diseases of the circulatory system: Secondary | ICD-10-CM

## 2016-03-11 DIAGNOSIS — I209 Angina pectoris, unspecified: Secondary | ICD-10-CM | POA: Diagnosis not present

## 2016-03-11 DIAGNOSIS — F17210 Nicotine dependence, cigarettes, uncomplicated: Secondary | ICD-10-CM | POA: Diagnosis not present

## 2016-03-11 DIAGNOSIS — Z7982 Long term (current) use of aspirin: Secondary | ICD-10-CM

## 2016-03-11 NOTE — Progress Notes (Signed)
Patient ID: Elizabeth [name], female   DOB: 1956/08/18, 60 y.o.   MRN: [id number]     Subjective:   Patient ID: Elizabeth [name] female    DOB: 10/25/1956 60 y.o.    MRN: [id number] Health Maintenance Due: Health Maintenance Due  Topic Date Due  . PAP SMEAR  10/28/2014    _________________________________________________  HPI: Elizabeth [name] is a 60 y.o. female here for hospital f/u.  Pt has a PMH outlined below.  Please see problem-based charting assessment and plan for further status of patient's chronic medical problems addressed at today's visit.  PMH: Past Medical History  Diagnosis Date  . Hypertension   . HLD (hyperlipidemia)     Chol = 235, LDL = 156 (08/2010)  . Substance abuse     cocaine   . Lupus ([place]) 2009    ANA + 10/2008, repeat ANA + (05/2009), on that visit 05/2009 following labs obtained RF <20, CRP <0.4, ANA titers 1:80,   . Pulmonary nodules 05/2008    noted on CXR and CT 05/2008, repeat CT 10/2008 - Stable small bilateral pulmonary nodules measuring up to 6 m m  . Insomnia   . GERD (gastroesophageal reflux disease)   . Fibroids   . History of microcytic hypochromic anemia   . Fibromyalgia   . Myocardial infarction ([place])   . CAD (coronary artery disease)     NSTEMI 08/2011:  LHC 08/21/11: mLAD 60-70%, pCFX occluded, dRCA chronic occlusion with L-R collats, EF 40-45%, inf AK.  PCI:  BMS to CFX.  Marland Kitchen Depression   . Shortness of breath 08/22/2013    Medications: Current Outpatient Prescriptions on File Prior to Visit  Medication Sig Dispense Refill  . apixaban (ELIQUIS) 5 MG TABS tablet Take 1 tablet (5 mg total) by mouth 2 (two) times daily. 60 tablet 0  . atorvastatin (LIPITOR) 80 MG tablet TAKE 1 TABLET(80 MG) BY MOUTH DAILY 90 tablet 3  . carvedilol (COREG) 12.5 MG tablet TAKE 1 TABLET(12.5 MG) BY MOUTH TWICE DAILY 180 tablet 3  . furosemide (LASIX) 20 MG tablet Take 2 tablets (40 mg total) by mouth daily. 30 tablet 5  . lisinopril  (PRINIVIL,ZESTRIL) 2.5 MG tablet Take 1 tablet (2.5 mg total) by mouth daily. 30 tablet 5  . pantoprazole (PROTONIX) 40 MG tablet Take 1 tablet (40 mg total) by mouth daily. 30 tablet 5  . sertraline (ZOLOFT) 50 MG tablet Take 1 tablet (50 mg total) by mouth daily. 30 tablet 2   No current facility-administered medications on file prior to visit.    Allergies: No Known Allergies  FH: Family History  Problem Relation Age of Onset  . Heart disease Mother   . Cervical cancer Sister   . Prostate cancer Brother   . Colon cancer Neg Hx     SH: Social History   Social History  . Marital Status: Divorced    Spouse Name: N/A  . Number of Children: N/A  . Years of Education: N/A   Social History Main Topics  . Smoking status: Current Some Day Smoker -- 0.10 packs/day    Types: Cigarettes  . Smokeless tobacco: None     Comment: 1-2 cigs/week.  Stopped yesterday  . Alcohol Use: 4.2 oz/week    7 Standard drinks or equivalent per week     Comment: beer 1-2 per day  . Drug Use: No     Comment: last used cocaine less than a month ago  . Sexual Activity: Not Currently  Other Topics Concern  . None   Social History Narrative    Review of Systems: Constitutional: Negative for fever, chills.  Eyes: Negative for blurred vision.  Respiratory: Negative for cough and shortness of breath.  Cardiovascular: Negative for chest pain.  Gastrointestinal: Negative for nausea, vomiting. Genitourinary: +hematuria.  Neurological: Negative for dizziness.   Objective:   Vital Signs: Filed Vitals:   03/11/16 0928  BP: 159/88  Pulse: 60  Temp: 97.9 F (36.6 C)  TempSrc: Oral  Height: _0  (1.575 m)  Weight: 207 lb 1.6 oz (93.94 kg)  SpO2: 97%      BP Readings from Last 3 Encounters:  03/11/16 159/88  03/06/16 124/82  12/04/15 169/94    Physical Exam: Constitutional: Vital signs reviewed.  Patient is in NAD and cooperative with exam.  Head: Normocephalic and  atraumatic. Eyes: EOMI, conjunctivae nl, no scleral icterus.  Neck: Supple. Cardiovascular: RRR, no MRG. Pulmonary/Chest: normal effort, CTAB, no wheezes, rales, or rhonchi. Abdominal: Soft. NT/ND +BS. Neurological: A&O x3, cranial nerves II-XII are grossly intact, moving all extremities. Extremities: Skin: Warm, dry and intact. No rash.   Assessment & Plan:   Assessment and plan was discussed and formulated with my attending.

## 2016-03-11 NOTE — Assessment & Plan Note (Addendum)
Pt presents for f/u of chest pain.  She was discharged on 4/21.  She was also subsequently noted to be in atrial fibrillation with RVR with HR into 130s.  She was ruled out for ACS, however, she was started on eliquis for afib.  She reverted to sinus rhythm after diltiazem.  She has not taken the eliquis since Sunday or Monday due to blood in her urine and causing her some stomach upset.  No blood in the stool.  Denies further chest pain or palpitations.  She is also on an 14m ASA.  She does not want to take eliquis due to the side effects.  She does have a history of CVA.  She has f/u with cardiology on 5/10.  CHADSVASC 6.  HASBLED 4, which puts her at high risk of major bleeding.  UA today with moderate blood.  We discussed the risks and benefits of eliquis.  Due to her history of a previous CVA, she has opted to stay on it for now.   -will check cbc -f/u with cardiology to discuss continuing eliquis or possibly switching to another agent

## 2016-03-11 NOTE — Progress Notes (Signed)
Case discussed with Dr. Gordy Levan soon after the resident saw the patient. We reviewed the resident's history and exam and pertinent patient test results. I agree with the assessment, diagnosis, and plan of care documented in the resident's note.  An alternative anticoagulant will need to be considered, including warfarin given her intolerance of the eliquis.  If Cardiology prefers we will gladly start warfarin and place her in our Anticoagulation Clinic.

## 2016-03-12 LAB — CBC
HEMATOCRIT: 40.8 % (ref 34.0–46.6)
Hemoglobin: 13.1 g/dL (ref 11.1–15.9)
MCH: 27.1 pg (ref 26.6–33.0)
MCHC: 32.1 g/dL (ref 31.5–35.7)
MCV: 85 fL (ref 79–97)
PLATELETS: 268 10*3/uL (ref 150–379)
RBC: 4.83 x10E6/uL (ref 3.77–5.28)
RDW: 15.5 % — AB (ref 12.3–15.4)
WBC: 5.7 10*3/uL (ref 3.4–10.8)

## 2016-03-18 ENCOUNTER — Ambulatory Visit: Payer: Medicare Other | Admitting: Internal Medicine

## 2016-03-25 ENCOUNTER — Ambulatory Visit: Payer: Medicare Other | Admitting: Cardiovascular Disease

## 2016-04-20 ENCOUNTER — Ambulatory Visit (INDEPENDENT_AMBULATORY_CARE_PROVIDER_SITE_OTHER): Payer: Medicare Other | Admitting: Cardiovascular Disease

## 2016-04-20 ENCOUNTER — Encounter: Payer: Self-pay | Admitting: Cardiovascular Disease

## 2016-04-20 VITALS — BP 130/82 | HR 82 | Ht 62.0 in | Wt 207.0 lb

## 2016-04-20 DIAGNOSIS — I5032 Chronic diastolic (congestive) heart failure: Secondary | ICD-10-CM

## 2016-04-20 DIAGNOSIS — Z79899 Other long term (current) drug therapy: Secondary | ICD-10-CM

## 2016-04-20 DIAGNOSIS — E785 Hyperlipidemia, unspecified: Secondary | ICD-10-CM

## 2016-04-20 DIAGNOSIS — I10 Essential (primary) hypertension: Secondary | ICD-10-CM | POA: Diagnosis not present

## 2016-04-20 DIAGNOSIS — I480 Paroxysmal atrial fibrillation: Secondary | ICD-10-CM | POA: Diagnosis not present

## 2016-04-20 DIAGNOSIS — I25118 Atherosclerotic heart disease of native coronary artery with other forms of angina pectoris: Secondary | ICD-10-CM | POA: Diagnosis not present

## 2016-04-20 DIAGNOSIS — Z8673 Personal history of transient ischemic attack (TIA), and cerebral infarction without residual deficits: Secondary | ICD-10-CM | POA: Insufficient documentation

## 2016-04-20 DIAGNOSIS — I2510 Atherosclerotic heart disease of native coronary artery without angina pectoris: Secondary | ICD-10-CM | POA: Insufficient documentation

## 2016-04-20 NOTE — Patient Instructions (Signed)
Medication Instructions: Dr Sallyanne Kuster recommends that you continue on your current medications as directed. Please refer to the Current Medication list given to you today.  Labwork: Your physician recommends that you return for lab work at your convenience - FASTING.  Testing/Procedures: NONE ORDERED  Follow-up: Dr Sallyanne Kuster recommends that you schedule a follow-up appointment in 3 months.   If you need a refill on your cardiac medications before your next appointment, please call your pharmacy.

## 2016-04-20 NOTE — Progress Notes (Signed)
Patient ID: Elizabeth [name], female   DOB: [birth year], 60 y.o.   MRN: [id number]    Cardiology Office Note    Date:  04/21/2016   ID:  Elizabeth [name] Dec 31, 1955, MRN [id number]  PCP:  Elizabeth Handy, MD  Cardiologist:   Elizabeth Klein, MD   Chief Complaint  Patient presents with  . Follow-up    2 weeks  pt states she has been good; c/o SOB on exertion, and swelling in right foot/ankle, numbness in right pointer finger; has not taken BP meds this morning    History of Present Illness:  Elizabeth [name] is a 60 y.o. female with a history of CAD, ischemic cardiomyopathy, hypertension, hyperlipidemia, chronic diastolic heart failure, prior history of cocaine abuse, pulmonary nodules, SLE and TIA who presented to [facility] ED On April 21 with angina and dyspnea related to a first episode of paroxysmal atrial fibrillation rapid ventricular response. Rate control was easily achieved with diltiazem and she then converted sinus rhythm with complete resolution of her complaints. Her echocardiogram showed no change (mild LVH, mildly reduced LVEF 50-55 percent, grade 1 diastolic dysfunction). There are no significant valvular abnormalities and the left atrium was not dilated.  She denies palpitations, angina or dyspnea since the events in April. She is taking a moderate dose of beta blocker.  She has shown imperfect compliance with the anticoagulant, but she states that she did not understand until today the purpose of this medication. She has a previous history of TIA. When I told her that the purpose of the medication was to prevent stroke, she promised perfect compliance in the future. She is still taking aspirin. She has noticed some blood-tinged nasal secretions and some gingival bleeding when she brushes her teeth, but no other serious bleeding problems.  She reports 100% abstinence from cocaine use. Her drug screen in April was negative.  She underwent a cardiac cath in  08/2011 in the setting of NSTEMI and cocaine use. Diagnostic cardiac catheterization demonstrates chronic total occlusion of the proximal right coronary artery with collateral flow. The left circumflex coronary artery is occluded proximally with a hang-up suggesting ancute lesion. There is a long 60%-70% stenosis in the mid LAD. Subsequently s/p BMS-LCx. Last seen cardiologist (Dr. Harrington [name]) during 08/2013 admission. Myoview 09/2015 showed fixed defect involving the inferolateral and anterolateral myocardium consistent with old MI. There is no reversible ischemia. There is LV systolic dysfunction (49%).  Her lipid profile was quite unfavorable last October (LDL 159). She has been started on atorvastatin, but has not had a repeat lipid profile since.  Past Medical History  Diagnosis Date  . Hypertension   . HLD (hyperlipidemia)     Chol = 235, LDL = 156 (08/2010)  . Substance abuse     cocaine   . Lupus ([place]) 2009    ANA + 10/2008, repeat ANA + (05/2009), on that visit 05/2009 following labs obtained RF <20, CRP <0.4, ANA titers 1:80,   . Pulmonary nodules 05/2008    noted on CXR and CT 05/2008, repeat CT 10/2008 - Stable small bilateral pulmonary nodules measuring up to 6 m m  . Insomnia   . GERD (gastroesophageal reflux disease)   . Fibroids   . History of microcytic hypochromic anemia   . Fibromyalgia   . Myocardial infarction ([place])   . CAD (coronary artery disease)     NSTEMI 08/2011:  LHC 08/21/11: mLAD 60-70%, pCFX occluded, dRCA chronic occlusion with L-R collats, EF 40-45%, inf  AK.  PCI:  BMS to CFX.  Marland Kitchen Depression   . Shortness of breath 08/22/2013    Past Surgical History  Procedure Laterality Date  . Abdominal hysterectomy  05/2003  . Colonoscopy N/A 07/14/2013    Procedure: COLONOSCOPY;  Surgeon: Elizabeth Beams, MD;  Location: [facility];  Service: Endoscopy;  Laterality: N/A;  . Stent on chest    . Cardiac catheterization    . Orif tibia plateau Left 08/22/2013     Procedure: OPEN REDUCTION INTERNAL FIXATION (ORIF) TIBIAL PLATEAU;  Surgeon: Elizabeth Butters, MD;  Location: [facility];  Service: Orthopedics;  Laterality: Left;    Current Medications: Outpatient Prescriptions Prior to Visit  Medication Sig Dispense Refill  . apixaban (ELIQUIS) 5 MG TABS tablet Take 1 tablet (5 mg total) by mouth 2 (two) times daily. 60 tablet 0  . atorvastatin (LIPITOR) 80 MG tablet TAKE 1 TABLET(80 MG) BY MOUTH DAILY 90 tablet 3  . carvedilol (COREG) 12.5 MG tablet TAKE 1 TABLET(12.5 MG) BY MOUTH TWICE DAILY 180 tablet 3  . furosemide (LASIX) 20 MG tablet Take 2 tablets (40 mg total) by mouth daily. 30 tablet 5  . lisinopril (PRINIVIL,ZESTRIL) 2.5 MG tablet Take 1 tablet (2.5 mg total) by mouth daily. 30 tablet 5  . pantoprazole (PROTONIX) 40 MG tablet Take 1 tablet (40 mg total) by mouth daily. 30 tablet 5  . sertraline (ZOLOFT) 50 MG tablet Take 1 tablet (50 mg total) by mouth daily. 30 tablet 2   No facility-administered medications prior to visit.     Allergies:   Review of patient's allergies indicates no known allergies.   Social History   Social History  . Marital Status: Divorced    Spouse Name: N/A  . Number of Children: N/A  . Years of Education: N/A   Social History Main Topics  . Smoking status: Current Some Day Smoker -- 0.10 packs/day    Types: Cigarettes  . Smokeless tobacco: None     Comment: 1-2 cigs some days   . Alcohol Use: 4.2 oz/week    7 Standard drinks or equivalent per week     Comment: beer 1-2 per day  . Drug Use: No     Comment: last used cocaine less than a month ago  . Sexual Activity: Not Currently   Other Topics Concern  . None   Social History Narrative     Family History:  The patient's family history includes Cervical cancer in her sister; Heart disease in her mother; Prostate cancer in her brother. There is no history of Colon cancer.   ROS:   Please see the history of present illness.    ROS All other systems  reviewed and are negative.   PHYSICAL EXAM:   VS:  BP 130/82 mmHg  Pulse 82  Ht _0  (1.575 m)  Wt 93.895 kg (207 lb)  BMI 37.85 kg/m2  LMP 12/30/1994   GEN: Well nourished, well developed, in no acute distress HEENT: normal Neck: no JVD, carotid bruits, or masses Cardiac: RRR; no murmurs, rubs, or gallops,no edema  Respiratory:  clear to auscultation bilaterally, normal work of breathing GI: soft, nontender, nondistended, + BS MS: no deformity or atrophy Skin: warm and dry, no rash Neuro:  Alert and Oriented x 3, Strength and sensation are intact Psych: euthymic mood, full affect  Wt Readings from Last 3 Encounters:  04/20/16 93.895 kg (207 lb)  03/11/16 93.94 kg (207 lb 1.6 oz)  03/06/16 94.348 kg (208 lb)  Studies/Labs Reviewed:   EKG:  EKG is not ordered today.    Recent Labs: 03/06/2016: ALT 22; B Natriuretic Peptide 209.1*; BUN 9; Creatinine, Ser 0.71; Hemoglobin 14.2; Magnesium 2.3; Potassium 4.2; Sodium 137; TSH 1.708 03/11/2016: Platelets 268   Lipid Panel    Component Value Date/Time   CHOL 236* 08/19/2015 0941   CHOL 192 05/10/2014 0135   TRIG 140 08/19/2015 0941   HDL 49 08/19/2015 0941   HDL 48 05/10/2014 0135   CHOLHDL 4.8* 08/19/2015 0941   CHOLHDL 4.0 05/10/2014 0135   VLDL 17 05/10/2014 0135   LDLCALC 159* 08/19/2015 0941   LDLCALC 127* 05/10/2014 0135   LDLDIRECT 145.2 10/29/2011 1618      ASSESSMENT:    1. Paroxysmal atrial fibrillation (HCC)   2. Chronic diastolic heart failure ([place])   3. Coronary artery disease involving native coronary artery of native heart with other form of angina pectoris ([place])   4. Essential hypertension   5. Dyslipidemia   6. Medication management      PLAN:  In order of problems listed above:  1. AFib: She has only had one brief event so far, uncertain what her future burden of arrhythmia will be. CHADSVasc 5 (gender, TIA 2, mild LV dysfunction, CAD). She is at high embolic risk and we reinforced the  need to take anticoagulation. She can stop aspirin. 2. CHF: Currently appears to be clinically euvolemic, NYHA functional class I. By echo her EF was borderline at 50-55 %, and I suspect this is a more accurate assessment than that obtained by gated scintigraphy in atrial fibrillation. She is taking a very low dose of furosemide. No changes in HF medicines recommended today. 3. CAD: She had some chest discomfort while in atrial fibrillation with rapid rate, but now does not have angina pectoris. Her most recent nuclear perfusion study in November 2016 was a low risk study. 4. HTN: Well controlled 5. HLP: She is now on a statin and we need to repeat her lipid profile 6. Stop aspirin to reduce her risk of bleeding while on direct oral anticoagulant    Medication Adjustments/Labs and Tests Ordered: Current medicines are reviewed at length with the patient today.  Concerns regarding medicines are outlined above.  Medication changes, Labs and Tests ordered today are listed in the Patient Instructions below. Patient Instructions  Medication Instructions: Dr Sallyanne Kuster recommends that you continue on your current medications as directed. Please refer to the Current Medication list given to you today.  Labwork: Your physician recommends that you return for lab work at your convenience - FASTING.  Testing/Procedures: NONE ORDERED  Follow-up: Dr Sallyanne Kuster recommends that you schedule a follow-up appointment in 3 months.   If you need a refill on your cardiac medications before your next appointment, please call your pharmacy.     Signed, Elizabeth Klein, MD  04/21/2016 5:00 PM    [address], [place], [place]  [address] Phone: [phone]; Fax: [phone]

## 2016-04-29 ENCOUNTER — Ambulatory Visit (INDEPENDENT_AMBULATORY_CARE_PROVIDER_SITE_OTHER): Payer: Medicare Other | Admitting: Pulmonary Disease

## 2016-04-29 ENCOUNTER — Encounter: Payer: Self-pay | Admitting: Pulmonary Disease

## 2016-04-29 ENCOUNTER — Other Ambulatory Visit: Payer: Self-pay | Admitting: Pulmonary Disease

## 2016-04-29 VITALS — BP 137/65 | HR 85 | Temp 98.1°F | Ht 62.0 in | Wt 211.1 lb

## 2016-04-29 DIAGNOSIS — I10 Essential (primary) hypertension: Secondary | ICD-10-CM | POA: Diagnosis not present

## 2016-04-29 DIAGNOSIS — M10071 Idiopathic gout, right ankle and foot: Secondary | ICD-10-CM | POA: Diagnosis present

## 2016-04-29 DIAGNOSIS — M109 Gout, unspecified: Secondary | ICD-10-CM

## 2016-04-29 MED ORDER — NAPROXEN 500 MG PO TABS: 500.0000 mg | ORAL_TABLET | Freq: Two times a day (BID) | ORAL | Status: DC

## 2016-04-29 NOTE — Patient Instructions (Addendum)
Take naproxen twice a day until 1 or 2 days after your symptoms have resolved.  Follow up in 6-8 weeks

## 2016-04-29 NOTE — Assessment & Plan Note (Signed)
Assessment: BP at goal today, 137/65  Plan: Continue carvedilol 12.40m BID, Lasix 441mdaily prn, lisinopril 2.25m38maily

## 2016-04-29 NOTE — Assessment & Plan Note (Signed)
Assessment: History of previous R great toe pain now recurrent. It is edematous and warm. Started yesterday.  Plan: Naproxen 518m BID until 1-2 days after clinical signs have resolved Follow up in 6-8 weeks

## 2016-04-29 NOTE — Progress Notes (Signed)
Subjective:    Patient ID: Elizabeth [name], female    DOB: October 21, 1956, 60 y.o.   MRN: [id number]  HPI Elizabeth [name] is a 60 year old woman with history of HTN, HLD, substance abuse, Lupus, GERD, CAD s/p PCI in 2012, depression presenting for evaluation of right great toe pain.  2nd episode started yesterday. No over the counter medications. R great toe hurts. 1st episode was 5 or 6 months ago. Has never had joint aspiration. The toe is swollen and hurts. Sharp pain. Even the bed sheets makes it hurt. No trauma. No changes in diet recently. Had ribs the other day. No dehydration. Took Lasix on Sunday, was taking it every day before then. Able to walk on it. Completely free of symptoms before this episode started. Has not improved.  Review of Systems Constitutional: no fevers/chills Ears, nose, mouth, throat, and face: no cough Respiratory: no shortness of breath Cardiovascular: no chest pain  Past Medical History  Diagnosis Date  . Hypertension   . HLD (hyperlipidemia)     Chol = 235, LDL = 156 (08/2010)  . Substance abuse     cocaine   . Lupus ([place]) 2009    ANA + 10/2008, repeat ANA + (05/2009), on that visit 05/2009 following labs obtained RF <20, CRP <0.4, ANA titers 1:80,   . Pulmonary nodules 05/2008    noted on CXR and CT 05/2008, repeat CT 10/2008 - Stable small bilateral pulmonary nodules measuring up to 6 m m  . Insomnia   . GERD (gastroesophageal reflux disease)   . Fibroids   . History of microcytic hypochromic anemia   . Fibromyalgia   . Myocardial infarction ([place])   . CAD (coronary artery disease)     NSTEMI 08/2011:  LHC 08/21/11: mLAD 60-70%, pCFX occluded, dRCA chronic occlusion with L-R collats, EF 40-45%, inf AK.  PCI:  BMS to CFX.  Marland Kitchen Depression   . Shortness of breath 08/22/2013    Current Outpatient Prescriptions on File Prior to Visit  Medication Sig Dispense Refill  . apixaban (ELIQUIS) 5 MG TABS tablet Take 1 tablet (5 mg total) by mouth 2  (two) times daily. 60 tablet 0  . atorvastatin (LIPITOR) 80 MG tablet TAKE 1 TABLET(80 MG) BY MOUTH DAILY 90 tablet 3  . carvedilol (COREG) 12.5 MG tablet TAKE 1 TABLET(12.5 MG) BY MOUTH TWICE DAILY 180 tablet 3  . furosemide (LASIX) 20 MG tablet Take 2 tablets (40 mg total) by mouth daily. 30 tablet 5  . lisinopril (PRINIVIL,ZESTRIL) 2.5 MG tablet Take 1 tablet (2.5 mg total) by mouth daily. 30 tablet 5  . pantoprazole (PROTONIX) 40 MG tablet Take 1 tablet (40 mg total) by mouth daily. 30 tablet 5  . sertraline (ZOLOFT) 50 MG tablet Take 1 tablet (50 mg total) by mouth daily. 30 tablet 2   No current facility-administered medications on file prior to visit.       Objective:   Physical Exam Blood pressure 137/65, pulse 85, temperature 98.1 F (36.7 C), temperature source Oral, height _0  (1.575 m), weight 211 lb 1.6 oz (95.754 kg), last menstrual period 12/30/1994, SpO2 95 %. General Apperance: NAD HEENT: Normocephalic, atraumatic, anicteric sclera Neck: Supple, trachea midline Lungs: Clear to auscultation bilaterally. No wheezes, rhonchi or rales. Breathing comfortably Heart: Regular rate and rhythm, no murmur/rub/gallop Abdomen: Soft, nontender, nondistended, no rebound/guarding Extremities: Warm and well perfused, no edema Skin: No rashes or lesions Neurologic: Alert and interactive. No gross deficits.    Assessment &  Plan:  Please refer to problem based charting.

## 2016-04-30 NOTE — Telephone Encounter (Signed)
Pt calls and states she read the side effects of naprosyn and has decided she would be at too much risk using it- cva and mi and she is afraid naprosyn would cause her to have one of these again and it would be worse. Please advise

## 2016-04-30 NOTE — Telephone Encounter (Signed)
She has two options: 1. Colchicine: biggest side effect is diarrhea, can also cause abdominal pain 2. Prednisone (steroid): she has taken this before back in 2013 when she had some shoulder pain  Could you see which she would like to take and I can prescribe it? Thanks!

## 2016-04-30 NOTE — Progress Notes (Signed)
Internal Medicine Clinic Attending  Case discussed with Dr. Randell Patient at the time of the visit.  We reviewed the resident's history and exam and pertinent patient test results.  I agree with the assessment, diagnosis, and plan of care documented in the resident's note.

## 2016-05-01 NOTE — Telephone Encounter (Signed)
Lm for rtc

## 2016-05-13 ENCOUNTER — Encounter: Payer: Self-pay | Admitting: *Deleted

## 2016-05-13 ENCOUNTER — Telehealth: Payer: Self-pay

## 2016-05-14 ENCOUNTER — Other Ambulatory Visit: Payer: Self-pay | Admitting: Pharmacist

## 2016-05-14 DIAGNOSIS — I480 Paroxysmal atrial fibrillation: Secondary | ICD-10-CM

## 2016-05-14 NOTE — Telephone Encounter (Addendum)
Elizabeth [name] is a 60 y.o. female who was contacted via telephone for monitoring of apixaban (Eliquis) therapy.    ASSESSMENT Indication(s): TIA/Afib Duration: indefinite  Labs:    Component Value Date/Time   AST 22 03/06/2016 0822   ALT 22 03/06/2016 0822   NA 137 03/06/2016 0822   K 4.2 03/06/2016 0822   CL 107 03/06/2016 0822   CO2 18* 03/06/2016 0822   GLUCOSE 103* 03/06/2016 0822   HGBA1C 6.0* 03/06/2016 0823   HGBA1C 5.9 12/25/2014 1234   BUN 9 03/06/2016 0822   CREATININE 0.71 03/06/2016 0822   CREATININE 0.75 05/30/2015 1040   CALCIUM 9.4 03/06/2016 0822   GFRNONAA >60 03/06/2016 0822   GFRNONAA 89 05/30/2015 1040   GFRAA >60 03/06/2016 0822   GFRAA >89 05/30/2015 1040   WBC 5.7 03/11/2016 1034   WBC 7.1 03/06/2016 0145   HGB 14.2 03/06/2016 0145   HCT 40.8 03/11/2016 1034   HCT 44.4 03/06/2016 0145   PLT 268 03/11/2016 1034   PLT 254 03/06/2016 0145    apixaban (Eliquis) Dose: 5 mg BID  Safety: Patient has not had recent bleeding/thromboembolic events. Patient reports no recent signs or symptoms of bleeding, no signs of symptoms of thromboembolism. Medication changes: no. Stated she was not taking naproxen and I advised her that if she could avoid it that would be best with her HTN and HF.   Adherence: Patient reports no known adherence challenges. Contacted pharmacy and records indicate refills are not consistent. Last fill 03/06/16 with no refills and the only time she picked up.  Patient Instructions: Patient advised to contact clinic or seek medical attention if signs/symptoms of bleeding or thromboembolism occur. Patient verbalized understanding by repeating back information.  Follow-up. Next appointment 07/23/16 with Cardiology   Angelena Form PharmD Candidate  05/14/2016, 9:20 AM

## 2016-05-19 MED ORDER — APIXABAN 5 MG PO TABS: 5.0000 mg | ORAL_TABLET | Freq: Two times a day (BID) | ORAL | Status: DC

## 2016-05-21 NOTE — Telephone Encounter (Signed)
Patient was contacted  by Karlyn Agee, PharmD candidate. I agree with the assessment and plan of care documented.  Sent refill request for apixaban to Dr. Sallyanne Kuster which was approved. Discussed anticoagulation options with Dr. Sallyanne Kuster and he agreed considering switching patient from warfarin if non-adherence continues to be a concern. Patient has an appointment with Dr. Sallyanne Kuster 07/23/16.

## 2016-05-26 DIAGNOSIS — Z79899 Other long term (current) drug therapy: Secondary | ICD-10-CM | POA: Diagnosis not present

## 2016-05-26 DIAGNOSIS — E785 Hyperlipidemia, unspecified: Secondary | ICD-10-CM | POA: Diagnosis not present

## 2016-05-27 LAB — COMPREHENSIVE METABOLIC PANEL
ALK PHOS: 114 U/L (ref 33–130)
ALT: 17 U/L (ref 6–29)
AST: 16 U/L (ref 10–35)
Albumin: 4 g/dL (ref 3.6–5.1)
BILIRUBIN TOTAL: 0.5 mg/dL (ref 0.2–1.2)
BUN: 11 mg/dL (ref 7–25)
CO2: 28 mmol/L (ref 20–31)
Calcium: 8.7 mg/dL (ref 8.6–10.4)
Chloride: 105 mmol/L (ref 98–110)
Creat: 0.71 mg/dL (ref 0.50–1.05)
GLUCOSE: 86 mg/dL (ref 65–99)
Potassium: 4.1 mmol/L (ref 3.5–5.3)
SODIUM: 143 mmol/L (ref 135–146)
Total Protein: 7.5 g/dL (ref 6.1–8.1)

## 2016-05-27 LAB — LIPID PANEL
Cholesterol: 218 mg/dL — ABNORMAL HIGH (ref 125–200)
HDL: 49 mg/dL (ref >=46)
LDL Cholesterol: 149 mg/dL — ABNORMAL HIGH (ref <130)
Total CHOL/HDL Ratio: 4.4 Ratio (ref <=5.0)
Triglycerides: 102 mg/dL (ref <150)
VLDL: 20 mg/dL (ref <30)

## 2016-05-27 LAB — CBC
HEMATOCRIT: 41.7 % (ref 35.0–45.0)
Hemoglobin: 13.4 g/dL (ref 11.7–15.5)
MCH: 27.7 pg (ref 27.0–33.0)
MCHC: 32.1 g/dL (ref 32.0–36.0)
MCV: 86.2 fL (ref 80.0–100.0)
MPV: 9.4 fL (ref 7.5–12.5)
PLATELETS: 232 10*3/uL (ref 140–400)
RBC: 4.84 MIL/uL (ref 3.80–5.10)
RDW: 15.5 % — AB (ref 11.0–15.0)
WBC: 5.6 10*3/uL (ref 3.8–10.8)

## 2016-05-29 ENCOUNTER — Telehealth: Payer: Self-pay | Admitting: *Deleted

## 2016-05-29 DIAGNOSIS — Z79899 Other long term (current) drug therapy: Secondary | ICD-10-CM

## 2016-05-29 DIAGNOSIS — E785 Hyperlipidemia, unspecified: Secondary | ICD-10-CM

## 2016-05-29 NOTE — Telephone Encounter (Signed)
Notes Recorded by Vanessa Ralphs, RN on 05/29/2016 at 3:32 PM Called patient and gave her instruction to get lab work in 6-8 weeks. She verbalized understanding and that she needed to take her atorvastatin as prescribed. Lab slips mailed to patient. Notes Recorded by Sanda Klein, MD on 05/28/2016 at 11:12 AM Need to recheck labs while on treatment in 6-8 weeks please Notes Recorded by Fidel Levy, RN on 05/28/2016 at 10:33 AM Patient called with results. She states she ran out of atorvastatin for about 1 week prior to get labs done. She was advised to get this Rx filled. Notes Recorded by Sanda Klein, MD on 05/27/2016 at 7:13 PM Cholesterol numbers look just as bad as before. Looks like she is not taking the atorvastatin. Can you please find out why?

## 2016-05-29 NOTE — Telephone Encounter (Signed)
-----  Message from Sanda Klein, MD sent at 05/27/2016  7:13 PM EDT ----- Cholesterol numbers look just as bad as before. Looks like she is not taking the atorvastatin. Can you please find out why?

## 2016-05-30 ENCOUNTER — Encounter (HOSPITAL_COMMUNITY): Payer: Self-pay | Admitting: Emergency Medicine

## 2016-05-30 ENCOUNTER — Inpatient Hospital Stay (HOSPITAL_COMMUNITY)
Admission: EM | Admit: 2016-05-30 | Discharge: 2016-06-19 | DRG: 234 | Disposition: A | Discharge status: Home / Self Care | Payer: Medicare Other | Attending: Cardiothoracic Surgery | Admitting: Cardiothoracic Surgery

## 2016-05-30 DIAGNOSIS — M19041 Primary osteoarthritis, right hand: Secondary | ICD-10-CM | POA: Diagnosis present

## 2016-05-30 DIAGNOSIS — I472 Ventricular tachycardia: Secondary | ICD-10-CM | POA: Diagnosis not present

## 2016-05-30 DIAGNOSIS — K219 Gastro-esophageal reflux disease without esophagitis: Secondary | ICD-10-CM | POA: Diagnosis present

## 2016-05-30 DIAGNOSIS — I959 Hypotension, unspecified: Secondary | ICD-10-CM | POA: Diagnosis not present

## 2016-05-30 DIAGNOSIS — Z6838 Body mass index (BMI) 38.0-38.9, adult: Secondary | ICD-10-CM

## 2016-05-30 DIAGNOSIS — E785 Hyperlipidemia, unspecified: Secondary | ICD-10-CM | POA: Diagnosis present

## 2016-05-30 DIAGNOSIS — I2582 Chronic total occlusion of coronary artery: Secondary | ICD-10-CM | POA: Diagnosis not present

## 2016-05-30 DIAGNOSIS — R1013 Epigastric pain: Secondary | ICD-10-CM | POA: Diagnosis not present

## 2016-05-30 DIAGNOSIS — Z8049 Family history of malignant neoplasm of other genital organs: Secondary | ICD-10-CM

## 2016-05-30 DIAGNOSIS — R079 Chest pain, unspecified: Secondary | ICD-10-CM | POA: Diagnosis not present

## 2016-05-30 DIAGNOSIS — F329 Major depressive disorder, single episode, unspecified: Secondary | ICD-10-CM | POA: Diagnosis present

## 2016-05-30 DIAGNOSIS — M109 Gout, unspecified: Secondary | ICD-10-CM | POA: Diagnosis present

## 2016-05-30 DIAGNOSIS — Z8249 Family history of ischemic heart disease and other diseases of the circulatory system: Secondary | ICD-10-CM

## 2016-05-30 DIAGNOSIS — I10 Essential (primary) hypertension: Secondary | ICD-10-CM | POA: Diagnosis present

## 2016-05-30 DIAGNOSIS — Z9689 Presence of other specified functional implants: Secondary | ICD-10-CM

## 2016-05-30 DIAGNOSIS — I255 Ischemic cardiomyopathy: Secondary | ICD-10-CM | POA: Diagnosis present

## 2016-05-30 DIAGNOSIS — M797 Fibromyalgia: Secondary | ICD-10-CM | POA: Diagnosis present

## 2016-05-30 DIAGNOSIS — R0789 Other chest pain: Secondary | ICD-10-CM | POA: Diagnosis not present

## 2016-05-30 DIAGNOSIS — Z7901 Long term (current) use of anticoagulants: Secondary | ICD-10-CM

## 2016-05-30 DIAGNOSIS — J189 Pneumonia, unspecified organism: Secondary | ICD-10-CM

## 2016-05-30 DIAGNOSIS — F418 Other specified anxiety disorders: Secondary | ICD-10-CM | POA: Diagnosis present

## 2016-05-30 DIAGNOSIS — R319 Hematuria, unspecified: Secondary | ICD-10-CM | POA: Diagnosis present

## 2016-05-30 DIAGNOSIS — R918 Other nonspecific abnormal finding of lung field: Secondary | ICD-10-CM

## 2016-05-30 DIAGNOSIS — I25110 Atherosclerotic heart disease of native coronary artery with unstable angina pectoris: Principal | ICD-10-CM | POA: Diagnosis present

## 2016-05-30 DIAGNOSIS — J449 Chronic obstructive pulmonary disease, unspecified: Secondary | ICD-10-CM | POA: Diagnosis present

## 2016-05-30 DIAGNOSIS — A599 Trichomoniasis, unspecified: Secondary | ICD-10-CM | POA: Diagnosis present

## 2016-05-30 DIAGNOSIS — I25119 Atherosclerotic heart disease of native coronary artery with unspecified angina pectoris: Secondary | ICD-10-CM | POA: Insufficient documentation

## 2016-05-30 DIAGNOSIS — R0602 Shortness of breath: Secondary | ICD-10-CM

## 2016-05-30 DIAGNOSIS — I5042 Chronic combined systolic (congestive) and diastolic (congestive) heart failure: Secondary | ICD-10-CM | POA: Diagnosis present

## 2016-05-30 DIAGNOSIS — Z09 Encounter for follow-up examination after completed treatment for conditions other than malignant neoplasm: Secondary | ICD-10-CM

## 2016-05-30 DIAGNOSIS — F419 Anxiety disorder, unspecified: Secondary | ICD-10-CM

## 2016-05-30 DIAGNOSIS — E119 Type 2 diabetes mellitus without complications: Secondary | ICD-10-CM | POA: Diagnosis present

## 2016-05-30 DIAGNOSIS — R0902 Hypoxemia: Secondary | ICD-10-CM | POA: Diagnosis present

## 2016-05-30 DIAGNOSIS — Z955 Presence of coronary angioplasty implant and graft: Secondary | ICD-10-CM

## 2016-05-30 DIAGNOSIS — D62 Acute posthemorrhagic anemia: Secondary | ICD-10-CM | POA: Diagnosis not present

## 2016-05-30 DIAGNOSIS — F17210 Nicotine dependence, cigarettes, uncomplicated: Secondary | ICD-10-CM | POA: Diagnosis not present

## 2016-05-30 DIAGNOSIS — G4700 Insomnia, unspecified: Secondary | ICD-10-CM | POA: Diagnosis present

## 2016-05-30 DIAGNOSIS — D259 Leiomyoma of uterus, unspecified: Secondary | ICD-10-CM | POA: Diagnosis present

## 2016-05-30 DIAGNOSIS — J90 Pleural effusion, not elsewhere classified: Secondary | ICD-10-CM

## 2016-05-30 DIAGNOSIS — E876 Hypokalemia: Secondary | ICD-10-CM | POA: Diagnosis present

## 2016-05-30 DIAGNOSIS — M329 Systemic lupus erythematosus, unspecified: Secondary | ICD-10-CM | POA: Diagnosis present

## 2016-05-30 DIAGNOSIS — Z951 Presence of aortocoronary bypass graft: Secondary | ICD-10-CM

## 2016-05-30 DIAGNOSIS — F1410 Cocaine abuse, uncomplicated: Secondary | ICD-10-CM | POA: Diagnosis present

## 2016-05-30 DIAGNOSIS — Z9861 Coronary angioplasty status: Secondary | ICD-10-CM

## 2016-05-30 DIAGNOSIS — K760 Fatty (change of) liver, not elsewhere classified: Secondary | ICD-10-CM | POA: Diagnosis present

## 2016-05-30 DIAGNOSIS — Z7982 Long term (current) use of aspirin: Secondary | ICD-10-CM

## 2016-05-30 DIAGNOSIS — I110 Hypertensive heart disease with heart failure: Secondary | ICD-10-CM | POA: Diagnosis not present

## 2016-05-30 DIAGNOSIS — I252 Old myocardial infarction: Secondary | ICD-10-CM

## 2016-05-30 DIAGNOSIS — I440 Atrioventricular block, first degree: Secondary | ICD-10-CM | POA: Diagnosis not present

## 2016-05-30 DIAGNOSIS — F32A Depression, unspecified: Secondary | ICD-10-CM | POA: Diagnosis present

## 2016-05-30 DIAGNOSIS — Z452 Encounter for adjustment and management of vascular access device: Secondary | ICD-10-CM

## 2016-05-30 DIAGNOSIS — I2510 Atherosclerotic heart disease of native coronary artery without angina pectoris: Secondary | ICD-10-CM | POA: Diagnosis present

## 2016-05-30 DIAGNOSIS — I480 Paroxysmal atrial fibrillation: Secondary | ICD-10-CM | POA: Diagnosis present

## 2016-05-30 DIAGNOSIS — Z8673 Personal history of transient ischemic attack (TIA), and cerebral infarction without residual deficits: Secondary | ICD-10-CM

## 2016-05-30 LAB — COMPREHENSIVE METABOLIC PANEL
ALT: 28 U/L (ref 14–54)
AST: 40 U/L (ref 15–41)
Albumin: 3.4 g/dL — ABNORMAL LOW (ref 3.5–5.0)
Alkaline Phosphatase: 141 U/L — ABNORMAL HIGH (ref 38–126)
Anion gap: 9 (ref 5–15)
BILIRUBIN TOTAL: 0.5 mg/dL (ref 0.3–1.2)
BUN: 9 mg/dL (ref 6–20)
CO2: 28 mmol/L (ref 22–32)
Calcium: 9.3 mg/dL (ref 8.9–10.3)
Chloride: 101 mmol/L (ref 101–111)
Creatinine, Ser: 0.78 mg/dL (ref 0.44–1.00)
Glucose, Bld: 98 mg/dL (ref 65–99)
POTASSIUM: 3.2 mmol/L — AB (ref 3.5–5.1)
Sodium: 138 mmol/L (ref 135–145)
TOTAL PROTEIN: 7.4 g/dL (ref 6.5–8.1)

## 2016-05-30 LAB — CBC WITH DIFFERENTIAL/PLATELET
Basophils Absolute: 0 10*3/uL (ref 0.0–0.1)
Basophils Relative: 0 %
EOS PCT: 1 %
Eosinophils Absolute: 0.1 10*3/uL (ref 0.0–0.7)
HEMATOCRIT: 45.1 % (ref 36.0–46.0)
Hemoglobin: 14.2 g/dL (ref 12.0–15.0)
Lymphocytes Relative: 19 %
Lymphs Abs: 2 10*3/uL (ref 0.7–4.0)
MCH: 27.5 pg (ref 26.0–34.0)
MCHC: 31.5 g/dL (ref 30.0–36.0)
MCV: 87.2 fL (ref 78.0–100.0)
MONO ABS: 0.5 10*3/uL (ref 0.1–1.0)
MONOS PCT: 5 %
NEUTROS PCT: 75 %
Neutro Abs: 7.7 10*3/uL (ref 1.7–7.7)
PLATELETS: 224 10*3/uL (ref 150–400)
RBC: 5.17 MIL/uL — ABNORMAL HIGH (ref 3.87–5.11)
RDW: 15 % (ref 11.5–15.5)
WBC: 10.3 10*3/uL (ref 4.0–10.5)

## 2016-05-30 LAB — I-STAT TROPONIN, ED: TROPONIN I, POC: 0 ng/mL (ref 0.00–0.08)

## 2016-05-30 LAB — LIPASE, BLOOD: LIPASE: 28 U/L (ref 11–51)

## 2016-05-30 NOTE — ED Notes (Signed)
Pt st's she has had nausea and vomiting for 2-3 days.  St's tonight after eating a pori chop she developed epigastric to mid chest pain radiating through to back.

## 2016-05-31 ENCOUNTER — Emergency Department (HOSPITAL_COMMUNITY): Payer: Medicare Other

## 2016-05-31 DIAGNOSIS — I959 Hypotension, unspecified: Secondary | ICD-10-CM | POA: Diagnosis not present

## 2016-05-31 DIAGNOSIS — K760 Fatty (change of) liver, not elsewhere classified: Secondary | ICD-10-CM | POA: Diagnosis not present

## 2016-05-31 DIAGNOSIS — D62 Acute posthemorrhagic anemia: Secondary | ICD-10-CM | POA: Diagnosis not present

## 2016-05-31 DIAGNOSIS — I2582 Chronic total occlusion of coronary artery: Secondary | ICD-10-CM | POA: Diagnosis not present

## 2016-05-31 DIAGNOSIS — I480 Paroxysmal atrial fibrillation: Secondary | ICD-10-CM | POA: Diagnosis not present

## 2016-05-31 DIAGNOSIS — R079 Chest pain, unspecified: Secondary | ICD-10-CM | POA: Insufficient documentation

## 2016-05-31 DIAGNOSIS — A599 Trichomoniasis, unspecified: Secondary | ICD-10-CM | POA: Diagnosis not present

## 2016-05-31 DIAGNOSIS — R1011 Right upper quadrant pain: Secondary | ICD-10-CM

## 2016-05-31 DIAGNOSIS — I110 Hypertensive heart disease with heart failure: Secondary | ICD-10-CM | POA: Diagnosis not present

## 2016-05-31 DIAGNOSIS — I25110 Atherosclerotic heart disease of native coronary artery with unstable angina pectoris: Secondary | ICD-10-CM | POA: Diagnosis not present

## 2016-05-31 DIAGNOSIS — I472 Ventricular tachycardia: Secondary | ICD-10-CM | POA: Diagnosis not present

## 2016-05-31 DIAGNOSIS — R0789 Other chest pain: Secondary | ICD-10-CM

## 2016-05-31 DIAGNOSIS — K219 Gastro-esophageal reflux disease without esophagitis: Secondary | ICD-10-CM | POA: Insufficient documentation

## 2016-05-31 DIAGNOSIS — M329 Systemic lupus erythematosus, unspecified: Secondary | ICD-10-CM | POA: Diagnosis not present

## 2016-05-31 DIAGNOSIS — R918 Other nonspecific abnormal finding of lung field: Secondary | ICD-10-CM

## 2016-05-31 DIAGNOSIS — I252 Old myocardial infarction: Secondary | ICD-10-CM

## 2016-05-31 DIAGNOSIS — R1013 Epigastric pain: Secondary | ICD-10-CM

## 2016-05-31 DIAGNOSIS — I5042 Chronic combined systolic (congestive) and diastolic (congestive) heart failure: Secondary | ICD-10-CM | POA: Diagnosis not present

## 2016-05-31 DIAGNOSIS — F418 Other specified anxiety disorders: Secondary | ICD-10-CM

## 2016-05-31 DIAGNOSIS — Z955 Presence of coronary angioplasty implant and graft: Secondary | ICD-10-CM

## 2016-05-31 DIAGNOSIS — F17210 Nicotine dependence, cigarettes, uncomplicated: Secondary | ICD-10-CM

## 2016-05-31 LAB — BASIC METABOLIC PANEL
Anion gap: 13 (ref 5–15)
BUN: 8 mg/dL (ref 6–20)
CHLORIDE: 99 mmol/L — AB (ref 101–111)
CO2: 24 mmol/L (ref 22–32)
CREATININE: 0.87 mg/dL (ref 0.44–1.00)
Calcium: 9 mg/dL (ref 8.9–10.3)
Glucose, Bld: 119 mg/dL — ABNORMAL HIGH (ref 65–99)
POTASSIUM: 3.4 mmol/L — AB (ref 3.5–5.1)
SODIUM: 136 mmol/L (ref 135–145)

## 2016-05-31 LAB — URINE MICROSCOPIC-ADD ON

## 2016-05-31 LAB — URINALYSIS, ROUTINE W REFLEX MICROSCOPIC
Bilirubin Urine: NEGATIVE
Glucose, UA: NEGATIVE mg/dL
HGB URINE DIPSTICK: NEGATIVE
Ketones, ur: NEGATIVE mg/dL
Nitrite: NEGATIVE
PH: 6.5 (ref 5.0–8.0)
Protein, ur: NEGATIVE mg/dL
SPECIFIC GRAVITY, URINE: 1.024 (ref 1.005–1.030)

## 2016-05-31 LAB — CBC
HEMATOCRIT: 43.2 % (ref 36.0–46.0)
Hemoglobin: 13.5 g/dL (ref 12.0–15.0)
MCH: 27.2 pg (ref 26.0–34.0)
MCHC: 31.3 g/dL (ref 30.0–36.0)
MCV: 87.1 fL (ref 78.0–100.0)
PLATELETS: 198 10*3/uL (ref 150–400)
RBC: 4.96 MIL/uL (ref 3.87–5.11)
RDW: 15.1 % (ref 11.5–15.5)
WBC: 11.6 10*3/uL — AB (ref 4.0–10.5)

## 2016-05-31 LAB — TROPONIN I
TROPONIN I: 0.03 ng/mL — AB (ref <0.03)
Troponin I: <0.03 ng/mL (ref <0.03)
Troponin I: <0.03 ng/mL (ref <0.03)
Troponin I: <0.03 ng/mL (ref <0.03)

## 2016-05-31 LAB — RAPID URINE DRUG SCREEN, HOSP PERFORMED
AMPHETAMINES: NOT DETECTED
Barbiturates: NOT DETECTED
Benzodiazepines: NOT DETECTED
COCAINE: NOT DETECTED
OPIATES: POSITIVE — AB
TETRAHYDROCANNABINOL: NOT DETECTED

## 2016-05-31 MED ORDER — PANTOPRAZOLE SODIUM 40 MG PO TBEC
40.0000 mg | DELAYED_RELEASE_TABLET | Freq: Every day | ORAL | Status: DC
  Administered 2016-05-31 – 2016-06-07 (×8): 40 mg via ORAL
  Filled 2016-05-31 (×8): qty 1

## 2016-05-31 MED ORDER — GI COCKTAIL ~~LOC~~
30.0000 mL | Freq: Once | ORAL | Status: AC
  Administered 2016-05-31: 30 mL via ORAL
  Filled 2016-05-31: qty 30

## 2016-05-31 MED ORDER — DEXTROSE 5 % IV SOLN
1.0000 g | Freq: Once | INTRAVENOUS | Status: AC
  Administered 2016-05-31: 1 g via INTRAVENOUS
  Filled 2016-05-31: qty 10

## 2016-05-31 MED ORDER — ONDANSETRON HCL 4 MG/2ML IJ SOLN
4.0000 mg | Freq: Once | INTRAMUSCULAR | Status: AC
  Administered 2016-05-31: 4 mg via INTRAVENOUS
  Filled 2016-05-31: qty 2

## 2016-05-31 MED ORDER — FUROSEMIDE 40 MG PO TABS
40.0000 mg | ORAL_TABLET | Freq: Every day | ORAL | Status: DC
  Administered 2016-05-31 – 2016-06-07 (×8): 40 mg via ORAL
  Filled 2016-05-31 (×8): qty 1

## 2016-05-31 MED ORDER — APIXABAN 5 MG PO TABS
5.0000 mg | ORAL_TABLET | Freq: Two times a day (BID) | ORAL | Status: DC
Start: 2016-05-31 — End: 2016-06-01
  Administered 2016-05-31 – 2016-06-01 (×3): 5 mg via ORAL
  Filled 2016-05-31 (×3): qty 1

## 2016-05-31 MED ORDER — LISINOPRIL 2.5 MG PO TABS
2.5000 mg | ORAL_TABLET | Freq: Every day | ORAL | Status: DC
Start: 2016-05-31 — End: 2016-06-08
  Administered 2016-05-31 – 2016-06-07 (×8): 2.5 mg via ORAL
  Filled 2016-05-31 (×8): qty 1

## 2016-05-31 MED ORDER — MORPHINE SULFATE (PF) 4 MG/ML IV SOLN
4.0000 mg | Freq: Once | INTRAVENOUS | Status: AC
  Administered 2016-05-31: 4 mg via INTRAVENOUS
  Filled 2016-05-31: qty 1

## 2016-05-31 MED ORDER — IPRATROPIUM-ALBUTEROL 0.5-2.5 (3) MG/3ML IN SOLN
3.0000 mL | Freq: Once | RESPIRATORY_TRACT | Status: AC
  Administered 2016-05-31: 3 mL via RESPIRATORY_TRACT
  Filled 2016-05-31: qty 3

## 2016-05-31 MED ORDER — ACETAMINOPHEN 650 MG RE SUPP: 650.0000 mg | Freq: Four times a day (QID) | RECTAL | Status: DC | PRN

## 2016-05-31 MED ORDER — SODIUM CHLORIDE 0.9% FLUSH
3.0000 mL | Freq: Two times a day (BID) | INTRAVENOUS | Status: DC
  Administered 2016-05-31 – 2016-06-06 (×8): 3 mL via INTRAVENOUS

## 2016-05-31 MED ORDER — CARVEDILOL 12.5 MG PO TABS
12.5000 mg | ORAL_TABLET | Freq: Two times a day (BID) | ORAL | Status: DC
  Administered 2016-05-31 – 2016-06-04 (×8): 12.5 mg via ORAL
  Filled 2016-05-31 (×9): qty 1

## 2016-05-31 MED ORDER — SERTRALINE HCL 50 MG PO TABS
50.0000 mg | ORAL_TABLET | Freq: Every day | ORAL | Status: DC
  Administered 2016-05-31 – 2016-06-07 (×8): 50 mg via ORAL
  Filled 2016-05-31 (×9): qty 1

## 2016-05-31 MED ORDER — IOPAMIDOL (ISOVUE-300) INJECTION 61%
INTRAVENOUS | Status: AC
  Administered 2016-05-31: 100 mL
  Filled 2016-05-31: qty 100

## 2016-05-31 MED ORDER — ATORVASTATIN CALCIUM 80 MG PO TABS
80.0000 mg | ORAL_TABLET | Freq: Every day | ORAL | Status: DC
  Administered 2016-05-31 – 2016-06-07 (×7): 80 mg via ORAL
  Filled 2016-05-31 (×8): qty 1

## 2016-05-31 MED ORDER — POTASSIUM CHLORIDE CRYS ER 20 MEQ PO TBCR
40.0000 meq | EXTENDED_RELEASE_TABLET | Freq: Once | ORAL | Status: AC
  Administered 2016-05-31: 40 meq via ORAL
  Filled 2016-05-31: qty 2

## 2016-05-31 MED ORDER — ONDANSETRON HCL 4 MG/2ML IJ SOLN: 4.0000 mg | Freq: Three times a day (TID) | INTRAMUSCULAR | Status: AC | PRN

## 2016-05-31 MED ORDER — ACETAMINOPHEN 325 MG PO TABS
650.0000 mg | ORAL_TABLET | Freq: Four times a day (QID) | ORAL | Status: DC | PRN
  Administered 2016-05-31 – 2016-06-01 (×3): 650 mg via ORAL
  Filled 2016-05-31 (×3): qty 2

## 2016-05-31 MED ORDER — ALBUTEROL SULFATE (2.5 MG/3ML) 0.083% IN NEBU: 2.5000 mg | INHALATION_SOLUTION | RESPIRATORY_TRACT | Status: AC | PRN

## 2016-05-31 MED ORDER — DEXTROSE 5 % IV SOLN
500.0000 mg | Freq: Once | INTRAVENOUS | Status: AC
  Administered 2016-05-31: 500 mg via INTRAVENOUS
  Filled 2016-05-31: qty 500

## 2016-05-31 NOTE — Progress Notes (Signed)
   Subjective: Reports her chest discomfort occurs mostly at night when lying down. She admits to eating at least 2 hours before bedtime. She denies any associated SOB or diaphoresis, but does note nausea. Reports having some mild pain this morning, but better than when she first came in.   Objective: Vital signs in last 24 hours: Filed Vitals:   05/31/16 0045 05/31/16 0100 05/31/16 0415 05/31/16 0818  BP: 151/98 153/90 133/115 118/55  Pulse: 78 85 95 79  Temp:   99.8 F (37.7 C)   TempSrc:   Oral   Resp: 32 18 20   Height:      Weight:   208 lb (94.348 kg)   SpO2: 95% 92% 99%    Physical Exam General: alert, sitting up, NAD HEENT: [place]/AT, EOMI, sclera anicteric, mucus membranes moist CV: RRR, no m/g/r Pulm: CTA bilaterally, breaths non-labored Abd: BS+, soft, obese, tenderness to palpation in the epigastrium Ext: no edema Neuro: alert and oriented x 3  Assessment/Plan:  GERD-related Chest Pain: Her description of her chest pain seems most consistent with pain related to GERD. She describes having pain that is worse at night and when she lies down. She is eating a few hours before going to bed. She admits that certain foods, such as beer, make the pain worse. She had significant epigastric tenderness on exam as well. With her extensive history of CAD (see below), I do not think it is unreasonable to trend her troponins to rule out ACS. Her initial troponin was negative and EKG unchanged. If her next two sets of troponins are negative I think she is stable for discharge. She will have close follow up in the [facility]. - Trend troponins - Continue Protonix 40 mg daily - Will give patient list of foods to avoid for acid reflux symptom control  CAD w/ Hx MI: In 2012 she was admitted with an NSTEMI found to be cocaine positive. She had recurrent chest pain during that admission with rising troponin levels and was taken to the cath lab. Cath showed 60-70% stenosis in the mid-LAD, proximal  occlusion of the left circumflex artery, and chronic occlusion of the RCA. She had a bare metal stent placed to the proximal left circumflex. Since that time she had a Myoview in Nov 2016 which showed an EF 30-44% with a fixed defect involving the inferolateral and anterolateral myocardium with no reversible ischemia consistent with an intermediate study. As mentioned above, her chest pain this admission seems more consistent with GERD-induced pain. Her initial troponin was negative and EKG without acute ischemic changes. We will continue to trend her troponins to make sure ACS is ruled out. - Trend troponins  - Continue Coreg 12.5 mg BID - Continue Lisinopril 2.5 mg daily - Continue Lipitor 80 mg daily   Paroxysmal AFib: HR stable in 70s-90s. - Continue Eliquis  HTN: BPs stable in 110s-120s. - Continue home Coreg, Lasix, and Lisinopril   Anxiety and Depression:  - Continue home Zoloft    Diet: Heart healthy DVT PPx: Eliquis  Dispo: Discharge later today if troponins are negative     Juliet Rude, MD 05/31/2016, 9:49 AM Pager: [phone]

## 2016-05-31 NOTE — H&P (Signed)
Date: 05/31/2016               Patient Name:  Elizabeth [name] MRN: [id number]  DOB: 05/09/1956 Age / Sex: 60 y.o., female   PCP: Ledell Noss, MD         Medical Service: Internal Medicine Teaching Service         Attending Physician: Dr. Oval Linsey, MD    First Contact: Dr. Reesa Chew Pager: [phone]  Second Contact: Dr. Arcelia Jew Pager: [phone]       After Hours (After 5p/  First Contact Pager: [phone]  weekends / holidays): Second Contact Pager: [phone]   Chief Complaint: chest pain  History of Present Illness: Elizabeth [name] is a 60 y.o. female with a h/o of CAD w/ h/o MI s/p cath, paroxysmal Afib, CHF, HTN/HLD, and GERD who presents with a 8 hour h/o aching substernal CP associated with epigastric and RUQ pain.  She reports that her symptoms began at 7 PM last night when she was lying in bed. She reports that he had she had last eaten pork chop at 7 AM that day but had not eaten since. She reports that prior to the onset of chest pain she had been experiencing several days of "regurgitation" for which she is usually treated with Protonix. However over the last several days she has not taken her Protonix because she ran out and has been using Tums for these symptoms. Since the onset of her symptoms at 7 PM she notes that the pain is substernal epigastric and right upper quadrant. The pain is described as aching and constant but nonradiating. She denies pleurisy she has a 3 pillow orthopnea at baseline and this has not changed. She normally takes 40 mg of Lasix at home but reports that she has been out of this medication for several days. She has been taking her sister's Lasix since that time and does not note any increased shortness of breath or lower extremity swelling in the days preceding her presentation. Since the onset of her symptoms last night she does note some difficulty with breathing on exertion. She does not have oxygen at home. Of note, she reports that she has  nitroglycerin which she took after the onset of her pain. This had no effect on her symptoms.  Recent cardiology notes from June 5 note that she has recently had an episode of paroxysmal A. fib with a CHADSV score 5 and was started on Eliquis at this time. She reports that she has been out of her L requests for the last week and has been taking only aspirin. Her aspirin was discontinued at that time she began taking Eliquis but when she ran out of this new medication at home she resume taking her aspirin. She has a long history of smoking but is currently only smoking 1-1.5 of cigarettes per day. She reports good compliance with her other medications and current antihypertensives, anti-hyperlipidemic, and her Zoloft. She reports a recent episode of gout for which she was prescribed naproxen but she reports taking only 1 dose of this prior to self-discontinuation due to concern for her heart.  Since her presentation she reports that she has had 2 episodes of vomiting. She denies any nausea or vomiting prior to 7 PM when her symptoms began. She also denies any changes to bowel habits specifically diarrhea but does note some history of constipation recently. She also denies changes to her appetite, urinary symptoms, muscle weakness, increased thirst, and  fevers. She notes that she has had chills since the onset of her symptoms but denies any fevers or chills prior to that. She has had a mild dry cough that has been only occasionally productive of sputum. This cough is new since the onset of her symptoms at 7 PM. She denies any sick contacts.  Since her arrival in the emergency department she has undergone EKG which was unchanged from prior, Chest x-ray which showed questionable pneumonia in the right upper lobe. and a CT abdomen and pelvis in the setting of her abdominal pain which was not concerning for any acute intra-abdominal process. She received one set of troponins which was negative at 11 PM. Initial  laboratory evaluation including CMP, CBC, UA, and UDS were not concerning for acute infection. UA was positive for trace leukocytes but she denies any symptoms. UDS demonstrated opiates in the setting of morphine administration in the ED. Since her arrival she's been treated with duo nebs, empiric azithromycin and ceftriaxone, GI cocktail, Zofran, and morphine. These treatments have not made significant changes to her symptoms.  Meds: No current outpatient prescriptions on file.  Current Facility-Administered Medications  Medication Dose Route Frequency Provider Last Rate Last Dose  . acetaminophen (TYLENOL) tablet 650 mg  650 mg Oral Q6H PRN Jule Ser, DO   650 mg at 05/31/16 0445   Or  . acetaminophen (TYLENOL) suppository 650 mg  650 mg Rectal Q6H PRN Jule Ser, DO      . albuterol (PROVENTIL) (2.5 MG/3ML) 0.083% nebulizer solution 2.5 mg  2.5 mg Nebulization Q2H PRN Tatyana Kirichenko, PA-C      . apixaban (ELIQUIS) tablet 5 mg  5 mg Oral BID Jule Ser, DO      . atorvastatin (LIPITOR) tablet 80 mg  80 mg Oral q1800 Jule Ser, DO      . carvedilol (COREG) tablet 12.5 mg  12.5 mg Oral BID WC Jule Ser, DO      . furosemide (LASIX) tablet 40 mg  40 mg Oral Daily Jule Ser, DO      . lisinopril (PRINIVIL,ZESTRIL) tablet 2.5 mg  2.5 mg Oral Daily Jule Ser, DO      . ondansetron [facility]) injection 4 mg  4 mg Intravenous Q8H PRN Tatyana Kirichenko, PA-C      . pantoprazole (PROTONIX) EC tablet 40 mg  40 mg Oral Daily Jule Ser, DO      . sertraline (ZOLOFT) tablet 50 mg  50 mg Oral Daily Jule Ser, DO      . sodium chloride flush (NS) 0.9 % injection 3 mL  3 mL Intravenous Q12H Jule Ser, DO        Allergies: Allergies as of 05/30/2016  . (No Known Allergies)   Past Medical History  Diagnosis Date  . Hypertension   . HLD (hyperlipidemia)     Chol = 235, LDL = 156 (08/2010)  . Substance abuse     cocaine   . Lupus ([place]) 2009    ANA +  10/2008, repeat ANA + (05/2009), on that visit 05/2009 following labs obtained RF <20, CRP <0.4, ANA titers 1:80,   . Pulmonary nodules 05/2008    noted on CXR and CT 05/2008, repeat CT 10/2008 - Stable small bilateral pulmonary nodules measuring up to 6 m m  . Insomnia   . GERD (gastroesophageal reflux disease)   . Fibroids   . History of microcytic hypochromic anemia   . Fibromyalgia   . Myocardial infarction ([place])   .  CAD (coronary artery disease)     NSTEMI 08/2011:  LHC 08/21/11: mLAD 60-70%, pCFX occluded, dRCA chronic occlusion with L-R collats, EF 40-45%, inf AK.  PCI:  BMS to CFX.  Marland Kitchen Depression   . Shortness of breath 08/22/2013    Family History: She has a family history significant for heart disease in her mother.  Social History: She reports smoking 1-1.5 cigarettes each day. She notes that she "can't quit if she needs to." She also endorses drinking 12 ounce beer daily. She denies any other illicit drugs.  Review of Systems: A complete ROS was negative except as per HPI.   Physical Exam: Filed Vitals:   05/31/16 0030 05/31/16 0045 05/31/16 0100 05/31/16 0415  BP: 154/92 151/98 153/90 133/115  Pulse: 88 78 85 95  Temp:    99.8 F (37.7 C)  TempSrc:    Oral  Resp: 19 32 18 20  Height:      Weight:    208 lb (94.348 kg)  SpO2: 91% 95% 92% 99%   General appearance: alert, cooperative, appears stated age and mild distress Head: Normocephalic, without obvious abnormality, atraumatic Eyes: conjunctivae/corneas clear. PERRL, EOM's grossly intact Back: symmetric, no curvature. ROM normal. No CVA tenderness. Lungs: clear to auscultation bilaterally Heart: regular rate and rhythm, S1, S2 normal, no murmur, click, rub or gallop Abdomen: soft, obese, non-distended belly w/ nml BS, mild TTP in mid-epigastric and TTP over the 12 rib in the axillary line. No organomegaly noted. Extremities: extremities normal, atraumatic, no cyanosis, trace bilateral edema Skin: Skin color,  texture, turgor normal. No rashes or lesions Neuro: Coordination and movement grossly intact, strength grossly intact UE and LE. Psych: A&Ox3, affect nml  Labs: CBC:  Recent Labs Lab 05/26/16 1015 05/30/16 2247  WBC 5.6 10.3  NEUTROABS  --  7.7  HGB 13.4 14.2  HCT 41.7 45.1  MCV 86.2 87.2  PLT 232 678   Basic Metabolic Panel:  Recent Labs Lab 05/26/16 1015 05/30/16 2247  NA 143 138  K 4.1 3.2*  CL 105 101  CO2 28 28  GLUCOSE 86 98  BUN 11 9  CREATININE 0.71 0.78  CALCIUM 8.7 9.3   Cardiac Enzymes:  Recent Labs  05/30/16 2315  TROPIPOC 0.00   Liver Function Tests:  Recent Labs Lab 05/26/16 1015 05/30/16 2247  AST 16 40  ALT 17 28  ALKPHOS 114 141*  BILITOT 0.5 0.5  PROT 7.5 7.4  ALBUMIN 4.0 3.4*    Recent Labs Lab 05/30/16 2247  LIPASE 28   EKG: EKG: unchanged from previous tracings, L fasicular block, old septal infart, and nonspecific ST / T wave changes in the lateral leads.  Imaging: Dg Chest 2 View  05/31/2016  CLINICAL DATA:  Chest pain radiating to the right side. Abdominal pain and vomiting for 2 days. EXAM: CHEST  2 VIEW COMPARISON:  03/06/2016 FINDINGS: Normal heart size and pulmonary vascularity. Fine interstitial pattern to the lungs may represent fibrosis or chronic bronchitic change. Linear atelectasis or fibrosis in the left lung base. Developing vague focal opacity in the right upper lung may indicate developing superimposed pneumonia. No blunting of costophrenic angles. No pneumothorax. Calcification of the aorta. IMPRESSION: Chronic interstitial process to the lungs with suggestion of focal developing airspace disease in the right upper lung which may indicate pneumonia. Electronically Signed   By: Lucienne Capers M.D.   On: 05/31/2016 01:41   Ct Abdomen Pelvis W Contrast  05/31/2016  CLINICAL DATA:  59 year old female Next  with chest pain, upper abdominal and epigastric pain, nausea vomiting. EXAM: CT ABDOMEN AND PELVIS WITH  CONTRAST TECHNIQUE: Multidetector CT imaging of the abdomen and pelvis was performed using the standard protocol following bolus administration of intravenous contrast. CONTRAST:  149m ISOVUE-300 IOPAMIDOL (ISOVUE-300) INJECTION 61% COMPARISON:  CT abdomen and pelvis dated 08/08/2013 and chest CT dated 11/24/2011 FINDINGS: There are multiple small pulmonary nodules measuring up to 6 mm in the right middle lobe which is similar to the study 11/24/2011. The visualized lung bases are otherwise clear. No intra-abdominal free air or free fluid. Apparent diffuse fatty infiltration of the liver with mild coarsened appearance of the liver parenchyma. The gallbladder, pancreas, spleen, adrenal glands, kidneys, visualized ureters, and urinary bladder appear unremarkable. Hysterectomy. There is scattered colonic diverticula without active inflammatory changes. There is no evidence of bowel obstruction or active inflammation. Normal appendix. 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 The origins of the celiac axis, SMA, IMA as well as the origins of the renal arteries remain patent. No portal venous gas identified. There is no adenopathy. Midline vertical anterior pelvic wall incisional scar. The abdominal wall soft tissues are otherwise unremarkable. There is osteopenia with degenerative changes of the spine. No acute fracture. IMPRESSION: Heterogeneous liver with apparent fatty infiltration. Correlation with LFTs recommended. Scattered colonic diverticula. No evidence of bowel obstruction or active inflammation. Normal appendix. Electronically Signed   By: AAnner CreteM.D.   On: 05/31/2016 02:16    Assessment & Plan by Problem: Active Problems:   Dyslipidemia   Anxiety and depression   Essential hypertension   Ischemic cardiomyopathy   Chest pain   Paroxysmal atrial fibrillation (HCC)   CAD (coronary artery disease)  Ms. Elizabeth PAYSIAH JURADOis a 60y.o. female with concerning cardiac history who  presents with chest abdominal pain. Differential includes acute coronary syndrome, pneumonia, and GERD. Her initial ED workup is negative for signs of ACS, but given her history we will continue to monitor her cardiac function. She also has some changes on chest x-ray suggesting pneumonia, however her history is not impressive for productive cough, shortness of breath, or other signs of infection. She also has a significant history of acid reflux and notes recent history of worsening regurgitation in the setting of having run out of her Protonix. At this point we will admit to the floor for rule out of MI, and treat pain symptomatically.  1) CAD with history of MI s/p cath (2012): Given her significant history of coronary artery disease and nonspecific symptoms of chest pain, we will rule out cardiac causes with serial troponins. EKG is not significantly changed from prior. Pain was relieved with administration of nitroglycerin.   . Admit to telemetry   . Trend troponins every 6 hours for total of 3   . Continue home atorvastatin, carvedilol, furosemide, lisinopril   . Heart Healthy diet  2) Right upper lobe infiltrates: Changes to chest x-ray suggestive of pneumonia in the right upper lobe. She does endorse cough or since been nonproductive. She remains afebrile, but reports chills. She received empiric treatment in the ED with azithromycin and ceftriaxone. Her pain is not pleuritic in nature, and she does not significant shortness of breath. Her O2 sats have been within normal limits.   . Recommend ambulation with O2 monitoring in the morning to assess for desaturations with activity   . Recommend repeat imaging with chest x-ray in the morning   . Trend CBC and BMP   . Continue albuterol nebulizer  when necessary   . Discontinue antibiotics  3) Abd pain: She notes mid epigastric and lateral RUQ pain which are mildly TTP. CT abd not concerning for acute intra-abdominal process, shows some fatty  liver changes with normal AST/ALT. No leukocytosis. Given h/o GERD and negative w/u, reflux is highest on the DDx. Has been off her PPI for several days.   Marland Kitchen Restart Protonix   . GI cocktail as needed for symptoms  4) Paroxysmal A. fib: She has had one episode of A. fib proximally 6 weeks ago. Was resulted in some chest discomfort. Her cardiologist has recently started her on Eliquis. She has run out of this 1 week, and has been taking only 81 mg aspirin. Her telemetry has been normal since her presentation to the ED.   . Admit to telemetry   . Restart Eliquis 5 mg twice a day  5) Hypertension: Stable since presentation. Home meds  6) Anxiety and depression: Continue home Zoloft.  DVT PPx - Eliquis 33m BID  Code Status - Full  Dispo: Admit patient to Observation with expected length of stay less than 2 midnights.  Signed: BHolley Raring MD 05/31/2016, 5:09 AM  Pager: [phone]

## 2016-05-31 NOTE — ED Notes (Signed)
Patient transported to CT. 

## 2016-05-31 NOTE — ED Notes (Signed)
Patient transported to X-ray.

## 2016-05-31 NOTE — ED Provider Notes (Signed)
CSN: [id number]     Arrival date & time 05/30/16  2221 History   First MD Initiated Contact with Patient 05/30/16 2359     Chief Complaint  Patient presents with  . Chest Pain     (Consider location/radiation/quality/duration/timing/severity/associated sxs/prior Treatment) HPI Elizabeth [name] is a 60 y.o. female with history of hypertension, lupus, polysubstance abuse, MI, coronary disease, fibromyalgia, acid reflux, presents to emergency department complaining of chest pain, abdominal pain, headache. Patient states her symptoms all started this afternoon. She does however state that she has been throwing up the last 2 days and she hasn't been able to keep anything down. She states she ate pork chop earlier today and has vomited shortly after. States most of the pain is in the center of the chest and epigastric abdomen, radiates to the left flank. She denies any fever or chills. She denies any hematemesis. No urinary symptoms. She has taken her aspirin and her regular medications today, did not take anything for pain or nausea. She denies any shortness of breath. She denies any dizziness or lightheadedness. She states her pain is 10 out of 10. Pain started while laying in bed.  Past Medical History  Diagnosis Date  . Hypertension   . HLD (hyperlipidemia)     Chol = 235, LDL = 156 (08/2010)  . Substance abuse     cocaine   . Lupus ([place]) 2009    ANA + 10/2008, repeat ANA + (05/2009), on that visit 05/2009 following labs obtained RF <20, CRP <0.4, ANA titers 1:80,   . Pulmonary nodules 05/2008    noted on CXR and CT 05/2008, repeat CT 10/2008 - Stable small bilateral pulmonary nodules measuring up to 6 m m  . Insomnia   . GERD (gastroesophageal reflux disease)   . Fibroids   . History of microcytic hypochromic anemia   . Fibromyalgia   . Myocardial infarction ([place])   . CAD (coronary artery disease)     NSTEMI 08/2011:  LHC 08/21/11: mLAD 60-70%, pCFX occluded, dRCA chronic occlusion  with L-R collats, EF 40-45%, inf AK.  PCI:  BMS to CFX.  Marland Kitchen Depression   . Shortness of breath 08/22/2013   Past Surgical History  Procedure Laterality Date  . Abdominal hysterectomy  05/2003  . Colonoscopy N/A 07/14/2013    Procedure: COLONOSCOPY;  Surgeon: Beryle Beams, MD;  Location: [facility];  Service: Endoscopy;  Laterality: N/A;  . Stent on chest    . Cardiac catheterization    . Orif tibia plateau Left 08/22/2013    Procedure: OPEN REDUCTION INTERNAL FIXATION (ORIF) TIBIAL PLATEAU;  Surgeon: Renette Butters, MD;  Location: [facility];  Service: Orthopedics;  Laterality: Left;   Family History  Problem Relation Age of Onset  . Heart disease Mother   . Cervical cancer Sister   . Prostate cancer Brother   . Colon cancer Neg Hx    Social History  Substance Use Topics  . Smoking status: Current Some Day Smoker -- 0.10 packs/day    Types: Cigarettes  . Smokeless tobacco: None     Comment: 1-2 cigs some days   . Alcohol Use: 4.2 oz/week    7 Standard drinks or equivalent per week     Comment: beer 1-2 per day   OB History    No data available     Review of Systems  Constitutional: Negative for fever and chills.  Respiratory: Positive for chest tightness. Negative for cough and shortness of breath.  Cardiovascular: Positive for chest pain. Negative for palpitations and leg swelling.  Gastrointestinal: Positive for nausea, vomiting and abdominal pain. Negative for diarrhea, constipation and blood in stool.  Genitourinary: Negative for dysuria, flank pain and pelvic pain.  Musculoskeletal: Negative for myalgias, arthralgias, neck pain and neck stiffness.  Skin: Negative for rash.  Neurological: Negative for dizziness, weakness and headaches.  All other systems reviewed and are negative.     Allergies  Review of patient's allergies indicates no known allergies.  Home Medications   Prior to Admission medications   Medication Sig Start Date End Date Taking? Authorizing  Provider  apixaban (ELIQUIS) 5 MG TABS tablet Take 1 tablet (5 mg total) by mouth 2 (two) times daily. 05/19/16   Mihai Croitoru, MD  atorvastatin (LIPITOR) 80 MG tablet TAKE 1 TABLET(80 MG) BY MOUTH DAILY 03/07/16   Liberty Handy, MD  carvedilol (COREG) 12.5 MG tablet TAKE 1 TABLET(12.5 MG) BY MOUTH TWICE DAILY 03/07/16   Liberty Handy, MD  furosemide (LASIX) 20 MG tablet Take 2 tablets (40 mg total) by mouth daily. 12/04/15   Milagros Loll, MD  lisinopril (PRINIVIL,ZESTRIL) 2.5 MG tablet Take 1 tablet (2.5 mg total) by mouth daily. 12/04/15   Milagros Loll, MD  naproxen (NAPROSYN) 500 MG tablet Take 1 tablet (500 mg total) by mouth 2 (two) times daily with a meal. 04/29/16   Milagros Loll, MD  pantoprazole (PROTONIX) 40 MG tablet Take 1 tablet (40 mg total) by mouth daily. 12/04/15   Milagros Loll, MD  sertraline (ZOLOFT) 50 MG tablet Take 1 tablet (50 mg total) by mouth daily. 12/04/15 12/03/16  Milagros Loll, MD   BP 158/87 mmHg  Pulse 96  Temp(Src) 99.2 F (37.3 C) (Oral)  Resp 26  Ht _0  (1.575 m)  Wt 93.895 kg  BMI 37.85 kg/m2  SpO2 95%  LMP 12/30/1994 Physical Exam  Constitutional: She appears well-developed and well-nourished.  HENT:  Head: Normocephalic.  Eyes: Conjunctivae are normal.  Neck: Normal range of motion. Neck supple.  Cardiovascular: Normal rate, regular rhythm and normal heart sounds.   Pulmonary/Chest: Effort normal and breath sounds normal. No respiratory distress. She has no wheezes. She has no rales.  No chest wall tenderness  Abdominal: Soft. Bowel sounds are normal. She exhibits no distension. There is no tenderness. There is no rebound.  Epigastric, right upper quadrant, left upper quadrant tenderness  Musculoskeletal: She exhibits no edema.  Neurological: She is alert.  Skin: Skin is warm and dry.  Psychiatric: She has a normal mood and affect. Her behavior is normal.  Appears anxious  Nursing note and vitals reviewed.   ED Course  Procedures  (including critical care time) Labs Review Labs Reviewed  CBC WITH DIFFERENTIAL/PLATELET - Abnormal; Notable for the following:    RBC 5.17 (*)    All other components within normal limits  COMPREHENSIVE METABOLIC PANEL - Abnormal; Notable for the following:    Potassium 3.2 (*)    Albumin 3.4 (*)    Alkaline Phosphatase 141 (*)    All other components within normal limits  LIPASE, BLOOD  I-STAT TROPOININ, ED    Imaging Review Dg Chest 2 View  05/31/2016  CLINICAL DATA:  Chest pain radiating to the right side. Abdominal pain and vomiting for 2 days. EXAM: CHEST  2 VIEW COMPARISON:  03/06/2016 FINDINGS: Normal heart size and pulmonary vascularity. Fine interstitial pattern to the lungs may represent fibrosis or chronic bronchitic change. Linear atelectasis or fibrosis in  the left lung base. Developing vague focal opacity in the right upper lung may indicate developing superimposed pneumonia. No blunting of costophrenic angles. No pneumothorax. Calcification of the aorta. IMPRESSION: Chronic interstitial process to the lungs with suggestion of focal developing airspace disease in the right upper lung which may indicate pneumonia. Electronically Signed   By: Lucienne Capers M.D.   On: 05/31/2016 01:41   Ct Abdomen Pelvis W Contrast  05/31/2016  CLINICAL DATA:  60 year old female Next with chest pain, upper abdominal and epigastric pain, nausea vomiting. EXAM: CT ABDOMEN AND PELVIS WITH CONTRAST TECHNIQUE: Multidetector CT imaging of the abdomen and pelvis was performed using the standard protocol following bolus administration of intravenous contrast. CONTRAST:  163m ISOVUE-300 IOPAMIDOL (ISOVUE-300) INJECTION 61% COMPARISON:  CT abdomen and pelvis dated 08/08/2013 and chest CT dated 11/24/2011 FINDINGS: There are multiple small pulmonary nodules measuring up to 6 mm in the right middle lobe which is similar to the study 11/24/2011. The visualized lung bases are otherwise clear. No intra-abdominal  free air or free fluid. Apparent diffuse fatty infiltration of the liver with mild coarsened appearance of the liver parenchyma. The gallbladder, pancreas, spleen, adrenal glands, kidneys, visualized ureters, and urinary bladder appear unremarkable. Hysterectomy. There is scattered colonic diverticula without active inflammatory changes. There is no evidence of bowel obstruction or active inflammation. Normal appendix. 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 The origins of the celiac axis, SMA, IMA as well as the origins of the renal arteries remain patent. No portal venous gas identified. There is no adenopathy. Midline vertical anterior pelvic wall incisional scar. The abdominal wall soft tissues are otherwise unremarkable. There is osteopenia with degenerative changes of the spine. No acute fracture. IMPRESSION: Heterogeneous liver with apparent fatty infiltration. Correlation with LFTs recommended. Scattered colonic diverticula. No evidence of bowel obstruction or active inflammation. Normal appendix. Electronically Signed   By: AAnner CreteM.D.   On: 05/31/2016 02:16   I have personally reviewed and evaluated these images and lab results as part of my medical decision-making.   EKG Interpretation   Date/Time:  Saturday May 30 2016 22:31:42 EDT Ventricular Rate:  86 PR Interval:  166 QRS Duration: 90 QT Interval:  384 QTC Calculation: 459 R Axis:   -46 Text Interpretation:  Normal sinus rhythm Left anterior fascicular block  Septal infarct , age undetermined ST \T\ T wave abnormality, consider  inferolateral ischemia Abnormal ECG No significant change since last  tracing Confirmed by OGlynn Octave([id number] on 05/31/2016 12:04:58  AM      MDM   Final diagnoses:  Chest pain, unspecified chest pain type  CAP (community acquired pneumonia)   Patient emergency department with headache, chest pain, abdominal pain, left flank pain, onset this afternoon. EKG with no  changes. Lab work obtained at triage, normal troponin, normal blood cell count, potassium 3.2, no other significant abnormalities. Will add chest x-ray, CT abdomen and pelvis for further evaluation. Antiemetics and pain medications ordered. Vital signs are normal other than hypertension.  Chest x-ray showing possible pneumonia. CT abdomen and pelvis with no concerning findings. Started on Rocephin and Zithromax for pneumonia. Patient did desat into the 80s after ambulating. She still complaining of pain. Will get admitted for hypoxia, pneumonia, chest pain rule out. Although considered PE, patient is a Ellik was, doubt patient has a PE. Duoneb ordered.   Filed Vitals:   05/31/16 0030 05/31/16 0045 05/31/16 0100 05/31/16 0415  BP: 154/92 151/98 153/90 133/115  Pulse: 88 78 85 95  Temp:    99.8 F (37.7 C)  TempSrc:    Oral  Resp: 19 32 18 20  Height:      Weight:    94.348 kg  SpO2: 91% 95% 92% 99%      Jeannett Senior, PA-C 05/31/16 9675  Everlene Balls, MD 05/31/16 1455

## 2016-05-31 NOTE — Progress Notes (Signed)
Internal Medicine Attending  Date: 05/31/2016  Patient name: Elizabeth [name] Medical record number: [id number] Date of birth: 09/29/1956 Age: 60 y.o. Gender: female  I saw and evaluated the patient. I reviewed the resident's note by Dr. Arcelia Jew and I agree with the resident's findings and plans as documented in her progress note.  Please see my H&P dated 05/31/2016 and attached to Dr. Jamse Arn H&P dated 05/31/2016 for the specifics of my evaluation, assessment, and plan from earlier today. If the final troponin is negative she will be discharged home with reinstitution of her medications that she ran out of including her PPI therapy. Follow-up will be in [facility].

## 2016-06-01 ENCOUNTER — Observation Stay (HOSPITAL_COMMUNITY): Payer: Medicare Other

## 2016-06-01 DIAGNOSIS — R0602 Shortness of breath: Secondary | ICD-10-CM | POA: Diagnosis not present

## 2016-06-01 DIAGNOSIS — R9439 Abnormal result of other cardiovascular function study: Secondary | ICD-10-CM

## 2016-06-01 DIAGNOSIS — R0789 Other chest pain: Secondary | ICD-10-CM | POA: Diagnosis not present

## 2016-06-01 DIAGNOSIS — A599 Trichomoniasis, unspecified: Secondary | ICD-10-CM

## 2016-06-01 DIAGNOSIS — R079 Chest pain, unspecified: Secondary | ICD-10-CM | POA: Diagnosis not present

## 2016-06-01 DIAGNOSIS — I252 Old myocardial infarction: Secondary | ICD-10-CM | POA: Diagnosis not present

## 2016-06-01 DIAGNOSIS — I2510 Atherosclerotic heart disease of native coronary artery without angina pectoris: Secondary | ICD-10-CM | POA: Diagnosis not present

## 2016-06-01 DIAGNOSIS — R778 Other specified abnormalities of plasma proteins: Secondary | ICD-10-CM

## 2016-06-01 DIAGNOSIS — K219 Gastro-esophageal reflux disease without esophagitis: Secondary | ICD-10-CM | POA: Diagnosis not present

## 2016-06-01 DIAGNOSIS — R918 Other nonspecific abnormal finding of lung field: Secondary | ICD-10-CM | POA: Diagnosis not present

## 2016-06-01 DIAGNOSIS — M79641 Pain in right hand: Secondary | ICD-10-CM

## 2016-06-01 LAB — NM MYOCAR MULTI W/SPECT W/WALL MOTION / EF
CHL CUP MPHR: 162 {beats}/min
CSEPEDS: 0 s
Estimated workload: 1 METS
Exercise duration (min): 0 min
Peak HR: 75 {beats}/min
Percent HR: 46 %
Rest HR: 61 {beats}/min

## 2016-06-01 LAB — APTT: APTT: 39 s — AB (ref 24–37)

## 2016-06-01 LAB — TROPONIN I
Troponin I: <0.03 ng/mL (ref <0.03)
Troponin I: 0.03 ng/mL (ref <0.03)

## 2016-06-01 LAB — HEPARIN LEVEL (UNFRACTIONATED)

## 2016-06-01 MED ORDER — HEPARIN (PORCINE) IN NACL 100-0.45 UNIT/ML-% IJ SOLN
1250.0000 [IU]/h | INTRAMUSCULAR | Status: DC
  Administered 2016-06-01: 1100 [IU]/h via INTRAVENOUS
  Filled 2016-06-01 (×2): qty 250

## 2016-06-01 MED ORDER — SODIUM CHLORIDE 0.9% FLUSH: 3.0000 mL | INTRAVENOUS | Status: DC | PRN

## 2016-06-01 MED ORDER — SODIUM CHLORIDE 0.9 % IV SOLN: 250.0000 mL | INTRAVENOUS | Status: DC | PRN

## 2016-06-01 MED ORDER — SODIUM CHLORIDE 0.9 % IV BOLUS (SEPSIS)
250.0000 mL | Freq: Once | INTRAVENOUS | Status: AC
  Administered 2016-06-01: 250 mL via INTRAVENOUS

## 2016-06-01 MED ORDER — TECHNETIUM TC 99M TETROFOSMIN IV KIT
10.0000 | PACK | Freq: Once | INTRAVENOUS | Status: AC | PRN
  Administered 2016-06-01: 10 via INTRAVENOUS

## 2016-06-01 MED ORDER — SODIUM CHLORIDE 0.9% FLUSH
3.0000 mL | Freq: Two times a day (BID) | INTRAVENOUS | Status: DC
  Administered 2016-06-02: 3 mL via INTRAVENOUS

## 2016-06-01 MED ORDER — METRONIDAZOLE 500 MG PO TABS
2000.0000 mg | ORAL_TABLET | Freq: Once | ORAL | Status: AC
Start: 2016-06-01 — End: 2016-06-01
  Administered 2016-06-01: 2000 mg via ORAL
  Filled 2016-06-01: qty 4

## 2016-06-01 MED ORDER — IBUPROFEN 200 MG PO TABS
400.0000 mg | ORAL_TABLET | Freq: Four times a day (QID) | ORAL | Status: DC | PRN
  Administered 2016-06-01 – 2016-06-07 (×6): 400 mg via ORAL
  Filled 2016-06-01 (×6): qty 2

## 2016-06-01 MED ORDER — SODIUM CHLORIDE 0.9 % WEIGHT BASED INFUSION: 1.0000 mL/kg/h | INTRAVENOUS | Status: DC

## 2016-06-01 MED ORDER — TECHNETIUM TC 99M TETROFOSMIN IV KIT
30.0000 | PACK | Freq: Once | INTRAVENOUS | Status: AC | PRN
  Administered 2016-06-01: 30 via INTRAVENOUS

## 2016-06-01 MED ORDER — ASPIRIN 81 MG PO CHEW
81.0000 mg | CHEWABLE_TABLET | ORAL | Status: AC
  Administered 2016-06-02: 81 mg via ORAL
  Filled 2016-06-01: qty 1

## 2016-06-01 MED ORDER — REGADENOSON 0.4 MG/5ML IV SOLN
INTRAVENOUS | Status: AC
  Administered 2016-06-01: 0.4 mg
  Filled 2016-06-01: qty 5

## 2016-06-01 MED ORDER — SODIUM CHLORIDE 0.9 % WEIGHT BASED INFUSION
3.0000 mL/kg/h | INTRAVENOUS | Status: AC
  Administered 2016-06-02: 3 mL/kg/h via INTRAVENOUS

## 2016-06-01 NOTE — Progress Notes (Signed)
   Subjective: Pt.C/O pain, stiffness and mild swelling at Rt. Metacarpophalangeal joints, no other associated symptoms. Denies any chest pain, cough, congestion, SOB. Her epigastric discomfort has  Decreased. No N/V/D. Wants to go home today. Objective: Vital signs in last 24 hours: Filed Vitals:   05/31/16 2021 06/01/16 0455 06/01/16 0651 06/01/16 0826  BP: 109/55 116/59  113/62  Pulse: 68 80  73  Temp: 100 F (37.8 C) 101.6 F (38.7 C) 99.6 F (37.6 C) 98.5 F (36.9 C)  TempSrc: Oral Oral  Oral  Resp: 19 18    Height:      Weight:  205 lb 11.2 oz (93.305 kg)    SpO2: 96% 90%  94%   Physical Exam General: Well built lady, lying comfortably in bed. Chest: Clear, no added sounds. CVS: RRR< No r/m/g. Abdomen: Soft, mildly tender epigastrium and RUQ, no guarding, no rebound. BS+ve  Extremities: Mild edema and tenderness at Rt. Posterior first two metacarpophalangeal joints. No pedal edema,no cyanosis. PP +ve. Bilaterally.  MEDS: Reviewed  Labs: Trop. 0.03 UA: Shows some trichomonas.  Assessment/Plan:  GERD-related Chest Pain: Her chest pain description was most likely GERD related. It's being resolved. She still have mild epigastric and RUQ tenderness, which has been improved since her stay in hospital.  Continue Protonix We will give pt. A list of food to avoid for her reflux.  CAD with H/O MI; With her PMH of MI with bare stent placement and her last Trop. Was 0.03 as compare to previous two <0.03.we will do a stress test to rule out ACS and then discharge accordingly.  Continue Coreg 12.5 mg BID - Continue Lisinopril 2.5 mg daily - Continue Lipitor 80 mg daily  Rt. Hand Pain: Looks like metacarpophalangeal joint inflammation. - ibuprofen 400 mg PRN   RUL infiltrate on previous CXR?  She denies any cough, congestion, fever,night sweats. We will repeat CXR PA and lateral view to re evaluate that.  Hematuria: She C/O one episode of  Hematuria this morning, Her previous  UA showed 0-5 RBC. And was positive for trichomonas. Repeat UA . Flagyl 2 gm PO Once Council about getting her partner treated, which she said don't have any since last year.  Paroxysmal AFib: HR stable in 70s-90s. - Continue Eliquis  HTN: BPs stable in 110s-120s. - Continue home Coreg, Lasix, and Lisinopril   Anxiety and Depression:  - Continue home Zoloft   Dispo: Anticipated discharge in approximately 1 day(s).     Lorella Nimrod, MD 06/01/2016, 8:35 AM Pager: [phone]

## 2016-06-01 NOTE — Progress Notes (Signed)
Blood pressure low during Lexiscan. PA Elliott at bedside. Pt in no distress. States she feels slightly dizzy. NS 224m started.

## 2016-06-01 NOTE — Progress Notes (Signed)
ANTICOAGULATION CONSULT NOTE - Initial Consult  Pharmacy Consult for heparin Indication: chest pain/ACS  No Known Allergies  Patient Measurements: Height: _0  (157.5 cm) Weight: 205 lb 11.2 oz (93.305 kg) IBW/kg (Calculated) : 50.1 Heparin Dosing Weight: 71.7kg  Vital Signs: Temp: 98.5 F (36.9 C) (07/17 0826) Temp Source: Oral (07/17 0826) BP: 100/64 mmHg (07/17 1402) Pulse Rate: 63 (07/17 1100)  Labs:  Recent Labs  05/30/16 2247 05/31/16 0443  05/31/16 1742 05/31/16 2329 06/01/16 0514  HGB 14.2 13.5  --   --   --   --   HCT 45.1 43.2  --   --   --   --   PLT 224 198  --   --   --   --   CREATININE 0.78 0.87  --   --   --   --   TROPONINI  --  <0.03  < > <0.03 <0.03 0.03*  < > = values in this interval not displayed.  Estimated Creatinine Clearance: 75 mL/min (by C-G formula based on Cr of 0.87).   Medical History: Past Medical History  Diagnosis Date  . Hypertension   . HLD (hyperlipidemia)     Chol = 235, LDL = 156 (08/2010)  . Substance abuse     cocaine   . Lupus ([place]) 2009    ANA + 10/2008, repeat ANA + (05/2009), on that visit 05/2009 following labs obtained RF <20, CRP <0.4, ANA titers 1:80,   . Pulmonary nodules 05/2008    noted on CXR and CT 05/2008, repeat CT 10/2008 - Stable small bilateral pulmonary nodules measuring up to 6 m m  . Insomnia   . GERD (gastroesophageal reflux disease)   . Fibroids   . History of microcytic hypochromic anemia   . Fibromyalgia   . Myocardial infarction ([place])   . CAD (coronary artery disease)     NSTEMI 08/2011:  LHC 08/21/11: mLAD 60-70%, pCFX occluded, dRCA chronic occlusion with L-R collats, EF 40-45%, inf AK.  PCI:  BMS to CFX.  Marland Kitchen Depression   . Shortness of breath 08/22/2013    Medications:  Scheduled:  . atorvastatin  80 mg Oral q1800  . carvedilol  12.5 mg Oral BID WC  . furosemide  40 mg Oral Daily  . lisinopril  2.5 mg Oral Daily  . pantoprazole  40 mg Oral Daily  . sertraline  50 mg Oral Daily   . sodium chloride flush  3 mL Intravenous Q12H    Assessment: Apixaban to heparin per pharmacy for CP. Last dose ~1030 7/17. Cath planned for tomorrow. H&H 13.5/43.2, plt 198.   Goal of Therapy:  aPTT 66-102 seconds  HL 0.3-0.7 Monitor platelets by anticoagulation protocol: Yes   Plan:  -STAT APTT/HL -Heparin infusion (no bolus) at 1100 units/hr (to start 2300) -APTT/HL with AM labs -Monitor s/sx bleeding, aPTT correlation with HL   Stephens November, PharmD Clinical Pharmacist  6:43 PM, 06/01/2016

## 2016-06-01 NOTE — Progress Notes (Signed)
Angelica, RN updated on pt's condition.

## 2016-06-01 NOTE — Consult Note (Signed)
CARDIOLOGY CONSULT NOTE   Patient ID: Elizabeth [name] MRN: [id number], DOB/AGE: 59/24/1957   Admit date: 05/30/2016 Date of Consult: 06/01/2016   Primary Physician: Ledell Noss, MD Primary Cardiologist: Dr. Sallyanne Kuster  Pt. Profile  Elizabeth [name] is pleasant 60 yo AA female with PMH of CAD, HTN, HLD, chronic diastolic HF, prior h/o cocaine use, SLE, PAF on eliquis and TIA presented with chest pain, stress test abnormal  Problem List  Past Medical History  Diagnosis Date  . Hypertension   . HLD (hyperlipidemia)     Chol = 235, LDL = 156 (08/2010)  . Substance abuse     cocaine   . Lupus ([place]) 2009    ANA + 10/2008, repeat ANA + (05/2009), on that visit 05/2009 following labs obtained RF <20, CRP <0.4, ANA titers 1:80,   . Pulmonary nodules 05/2008    noted on CXR and CT 05/2008, repeat CT 10/2008 - Stable small bilateral pulmonary nodules measuring up to 6 m m  . Insomnia   . GERD (gastroesophageal reflux disease)   . Fibroids   . History of microcytic hypochromic anemia   . Fibromyalgia   . Myocardial infarction ([place])   . CAD (coronary artery disease)     NSTEMI 08/2011:  LHC 08/21/11: mLAD 60-70%, pCFX occluded, dRCA chronic occlusion with L-R collats, EF 40-45%, inf AK.  PCI:  BMS to CFX.  Marland Kitchen Depression   . Shortness of breath 08/22/2013    Past Surgical History  Procedure Laterality Date  . Abdominal hysterectomy  05/2003  . Colonoscopy N/A 07/14/2013    Procedure: COLONOSCOPY;  Surgeon: Beryle Beams, MD;  Location: [facility];  Service: Endoscopy;  Laterality: N/A;  . Stent on chest    . Cardiac catheterization    . Orif tibia plateau Left 08/22/2013    Procedure: OPEN REDUCTION INTERNAL FIXATION (ORIF) TIBIAL PLATEAU;  Surgeon: Renette Butters, MD;  Location: [facility];  Service: Orthopedics;  Laterality: Left;     Allergies  No Known Allergies  HPI   Elizabeth [name] is pleasant 60 yo AA female with PMH of CAD, HTN, HLD, chronic diastolic HF, prior h/o cocaine  use, SLE, PAF on eliquis and TIA. She has not used cocaine recently, her most recent UDT was negative. Her last cardiac catheterization in October 2012 in the setting of NSTEMI and the cocaine use showed chronic total occlusion of proximal RCA with collateral flows, 60-70% long stenosis in mid LAD, acutely occluded left circumflex treated with bare-metal stent. Myoview obtained in November 2016 showed a fixed defect involving the inferolateral wall and anterolateral myocardium consistent with old MI, no reversible ischemia, LVEF 43%. He was admitted in April and found to have new atrial fibrillation and was placed on eliquis. Echocardiogram obtained on 03/06/2016 showed EF 75-30%, grade 1 diastolic dysfunction, mild LVH. Her most recent lipid profile shows her cholesterol was 218, triglyceride 102, HDL 49, LDL 149.  For the past several weeks, patient has been having intermittent prolonged chest discomfort lasting hours at a time. She described the chest discomfort as a substernal pain associated with burping and vomiting. She attributed to acid reflux. She did run out of her medication for the entire week including her acid reflux medication and eliquis. She was in her usual state of health until the night of 05/30/2016, when she had another episode of substernal chest discomfort along with nausea and vomiting. It lasted at least 3 hours before it eventually went away after her arrival in  the ED. She was admitted to internal medicine teaching service. Lexiscan Myoview was performed on 06/01/2016 which came back high risk with EF 49%, high lateral wall scar with peri-infarct ischemia, anterior wall ischemia. Cardiology has been consulted for abnormal stress test.   Inpatient Medications  . atorvastatin  80 mg Oral q1800  . carvedilol  12.5 mg Oral BID WC  . furosemide  40 mg Oral Daily  . lisinopril  2.5 mg Oral Daily  . pantoprazole  40 mg Oral Daily  . sertraline  50 mg Oral Daily  . sodium chloride  flush  3 mL Intravenous Q12H    Family History Family History  Problem Relation Age of Onset  . Heart disease Mother   . Cervical cancer Sister   . Prostate cancer Brother   . Colon cancer Neg Hx      Social History Social History   Social History  . Marital Status: Divorced    Spouse Name: N/A  . Number of Children: N/A  . Years of Education: N/A   Occupational History  . Not on file.   Social History Main Topics  . Smoking status: Current Some Day Smoker -- 0.10 packs/day    Types: Cigarettes  . Smokeless tobacco: Not on file     Comment: 1-2 cigs some days   . Alcohol Use: 4.2 oz/week    7 Standard drinks or equivalent per week     Comment: beer 1-2 per day  . Drug Use: No     Comment: last used cocaine less than a month ago  . Sexual Activity: Not Currently   Other Topics Concern  . Not on file   Social History Narrative     Review of Systems  General:  No chills, fever, night sweats or weight changes.  Cardiovascular:  No dyspnea on exertion, edema, orthopnea, palpitations, paroxysmal nocturnal dyspnea. +chest pain Dermatological: No rash, lesions/masses Respiratory: No cough, dyspnea Urologic: No hematuria, dysuria Abdominal:   No nausea, diarrhea, bright red blood per rectum, melena, or hematemesis +vomiting Neurologic:  No visual changes, wkns, changes in mental status. All other systems reviewed and are otherwise negative except as noted above.  Physical Exam  Blood pressure 100/64, pulse 63, temperature 98.5 F (36.9 C), temperature source Oral, resp. rate 18, height _0  (1.575 m), weight 205 lb 11.2 oz (93.305 kg), last menstrual period 12/30/1994, SpO2 94 %.  General: Pleasant, NAD Psych: Normal affect. Neuro: Alert and oriented X 3. Moves all extremities spontaneously. HEENT: Normal  Neck: Supple without bruits or JVD. Lungs:  Resp regular and unlabored, CTA. Heart: RRR no s3, s4, or murmurs. Abdomen: Soft, non-tender, non-distended, BS +  x 4.  Extremities: No clubbing, cyanosis or edema. DP/PT/Radials 2+ and equal bilaterally.  Labs   Recent Labs  05/31/16 1551 05/31/16 1742 05/31/16 2329 06/01/16 0514  TROPONINI 0.03* <0.03 <0.03 0.03*   Lab Results  Component Value Date   WBC 11.6* 05/31/2016   HGB 13.5 05/31/2016   HCT 43.2 05/31/2016   MCV 87.1 05/31/2016   PLT 198 05/31/2016    Recent Labs Lab 05/30/16 2247 05/31/16 0443  NA 138 136  K 3.2* 3.4*  CL 101 99*  CO2 28 24  BUN 9 8  CREATININE 0.78 0.87  CALCIUM 9.3 9.0  PROT 7.4  --   BILITOT 0.5  --   ALKPHOS 141*  --   ALT 28  --   AST 40  --   GLUCOSE 98 119*  Lab Results  Component Value Date   CHOL 218* 05/26/2016   HDL 49 05/26/2016   LDLCALC 149* 05/26/2016   TRIG 102 05/26/2016   Lab Results  Component Value Date   DDIMER  04/02/2010    0.23        AT THE INHOUSE ESTABLISHED CUTOFF VALUE OF 0.48 ug/mL FEU, THIS ASSAY HAS BEEN DOCUMENTED IN THE LITERATURE TO HAVE A SENSITIVITY AND NEGATIVE PREDICTIVE VALUE OF AT LEAST 98 TO 99%.  THE TEST RESULT SHOULD BE CORRELATED WITH AN ASSESSMENT OF THE CLINICAL PROBABILITY OF DVT / VTE.    Radiology/Studies  Dg Chest 2 View  06/01/2016  CLINICAL DATA:  Lung infiltrate. EXAM: CHEST  2 VIEW COMPARISON:  Radiograph of May 31, 2016. FINDINGS: The heart size and mediastinal contours are within normal limits. Both lungs are clear. No pneumothorax or pleural effusion is noted. The visualized skeletal structures are unremarkable. IMPRESSION: No active cardiopulmonary disease. Electronically Signed   By: Marijo Conception, M.D.   On: 06/01/2016 11:46   Dg Chest 2 View  05/31/2016  CLINICAL DATA:  Chest pain radiating to the right side. Abdominal pain and vomiting for 2 days. EXAM: CHEST  2 VIEW COMPARISON:  03/06/2016 FINDINGS: Normal heart size and pulmonary vascularity. Fine interstitial pattern to the lungs may represent fibrosis or chronic bronchitic change. Linear atelectasis or fibrosis  in the left lung base. Developing vague focal opacity in the right upper lung may indicate developing superimposed pneumonia. No blunting of costophrenic angles. No pneumothorax. Calcification of the aorta. IMPRESSION: Chronic interstitial process to the lungs with suggestion of focal developing airspace disease in the right upper lung which may indicate pneumonia. Electronically Signed   By: Lucienne Capers M.D.   On: 05/31/2016 01:41   Ct Abdomen Pelvis W Contrast  05/31/2016  CLINICAL DATA:  60 year old female Next with chest pain, upper abdominal and epigastric pain, nausea vomiting. EXAM: CT ABDOMEN AND PELVIS WITH CONTRAST TECHNIQUE: Multidetector CT imaging of the abdomen and pelvis was performed using the standard protocol following bolus administration of intravenous contrast. CONTRAST:  147m ISOVUE-300 IOPAMIDOL (ISOVUE-300) INJECTION 61% COMPARISON:  CT abdomen and pelvis dated 08/08/2013 and chest CT dated 11/24/2011 FINDINGS: There are multiple small pulmonary nodules measuring up to 6 mm in the right middle lobe which is similar to the study 11/24/2011. The visualized lung bases are otherwise clear. No intra-abdominal free air or free fluid. Apparent diffuse fatty infiltration of the liver with mild coarsened appearance of the liver parenchyma. The gallbladder, pancreas, spleen, adrenal glands, kidneys, visualized ureters, and urinary bladder appear unremarkable. Hysterectomy. There is scattered colonic diverticula without active inflammatory changes. There is no evidence of bowel obstruction or active inflammation. Normal appendix. 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 The origins of the celiac axis, SMA, IMA as well as the origins of the renal arteries remain patent. No portal venous gas identified. There is no adenopathy. Midline vertical anterior pelvic wall incisional scar. The abdominal wall soft tissues are otherwise unremarkable. There is osteopenia with degenerative changes  of the spine. No acute fracture. IMPRESSION: Heterogeneous liver with apparent fatty infiltration. Correlation with LFTs recommended. Scattered colonic diverticula. No evidence of bowel obstruction or active inflammation. Normal appendix. Electronically Signed   By: AAnner CreteM.D.   On: 05/31/2016 02:16   Nm Myocar Multi W/spect W/wall Motion / Ef  06/01/2016  CLINICAL DATA:  Shortness of breath and chest pain. EXAM: MYOCARDIAL IMAGING WITH SPECT (REST AND PHARMACOLOGIC-STRESS) GATED LEFT VENTRICULAR  WALL MOTION STUDY LEFT VENTRICULAR EJECTION FRACTION TECHNIQUE: Standard myocardial SPECT imaging was performed after resting intravenous injection of 10 mCi Tc-31mtetrofosmin. Subsequently, intravenous infusion of Lexiscan was performed under the supervision of the Cardiology staff. At peak effect of the drug, 30 mCi Tc-914metrofosmin was injected intravenously and standard myocardial SPECT imaging was performed. Quantitative gated imaging was also performed to evaluate left ventricular wall motion, and estimate left ventricular ejection fraction. COMPARISON:  None. FINDINGS: Perfusion: Decreased activity in the high lateral wall on both sets of images but more pronounced on the stress images. There is also anterior wall reversibility Wall Motion: Wall motion abnormality involving the upper lateral wall. Left Ventricular Ejection Fraction: 49 % End diastolic volume 12706l End systolic volume 61 ml IMPRESSION: 1. Findings suggest high lateral wall scar with peri-infarct ischemia. There is also anterior wall ischemia. 2. Upper lateral wall motion abnormality. 3. Left ventricular ejection fraction 49% 4. Non invasive risk stratification*: High risk *2012 Appropriate Use Criteria for Coronary Revascularization Focused Update: J Am Coll Cardiol. 202376;28(3):151-761http://[email or web address]?[email or web address] Electronically Signed   By: P.Marijo Sanes.D.   On: 06/01/2016 16:34     ECG  Normal sinus rhythm with inferolateral T-wave inversion that appears to be chronic.  ASSESSMENT AND PLAN  1. Abnormal stress test  - Although chest discomfort has been somewhat atypical, however patient has a known 60-70% long lesion in the mid LAD from his previous cardiac catheterization in 2012, today's Myoview also showed anterior ischemia which is concerning for progression of previously known disease.  - Discussed with Dr. HoPercival Spanishwill hold her eliquis tonight and plan for cardiac catheterization tomorrow.  2. PAF on eliquis  - maintaining NSR. CHA2DS2-Vasc score (female, CAD, HTN)  - holding eliquis   3. CAD s/p BMS to LCx in 08/2014 with 60-70% residual in mid LAD  4. HTN: stable. BP soft after myoview today, now improved  5. HLD: recent lipid panel 05/26/2016 cholesterol was 218, triglyceride 102, HDL 49, LDL 149. Currently on 8095mipitor  6. chronic diastolic HF: euvolemic on exam  7. H/o cocaine use: recent UDT negative  Signed, HaoAlmyra DeforestA-C 06/01/2016, 5:53 PM   History and all data above reviewed.  Patient examined.  I agree with the findings as above.  Chest pain is atypical.  However, she has known CAD and I reviewed the LexCraigd there does appear to be ischemia.  The patient exam reveals COR:RRR  ,  Lungs: Clear  ,  Abd: Positive bowel sounds, no rebound no guarding, Ext No edema  .  All available labs, radiology testing, previous records reviewed. Agree with documented assessment and plan. Chest pain:  Positive Lexiscan Myoview.  Plan cardiac cath.  The patient understands that risks included but are not limited to stroke (1 in 1000), death (1 in 10040kidney failure [usually temporary] (1 in 500), bleeding (1 in 200), allergic reaction [possibly serious] (1 in 200).  The patient understands and agrees to proceed.    JamJeneen Rinkschrein  6:14 PM  06/01/2016

## 2016-06-01 NOTE — Progress Notes (Signed)
Pt states she feels better. Tolerated crackers and soda well. NS bolus infusing well.

## 2016-06-01 NOTE — Care Management Obs Status (Signed)
[facility] NOTIFICATION   Patient Details  Name: ESRAA SERES MRN: [id number] Date of Birth: 1956-03-08   Medicare Observation Status Notification Given:  Yes    Dawayne Patricia, RN 06/01/2016, 10:42 AM

## 2016-06-01 NOTE — Progress Notes (Addendum)
1 day lexiscan myoview completed without significant complication, pending result by [facility] radiologist.  Her BP was little soft after the lexiscan, last SBP 89, she is feeling ok. I will give her 233m IVF.   SHilbert CorriganPA Pager: [phone]

## 2016-06-01 NOTE — Progress Notes (Signed)
Internal Medicine Attending  Date: 06/01/2016  Patient name: Elizabeth [name] Medical record number: [id number] Date of birth: 07/08/1956 Age: 60 y.o. Gender: female  I saw and evaluated the patient. I reviewed the resident's note by Dr. Reesa Chew and I agree with the resident's findings and plans as documented in her progress note.  When seen on rounds this morning Elizabeth [name] was feeling well without any further chest pain. She was anxious to go home. With the positive third troponin we decided to pursue a pharmacologic stress test given her underlying coronary artery disease history to rule out reversible ischemia. With the reported fever last night we will also repeat the PA and lateral chest x-ray to better assess the vague right upper lobe infiltrate seen on admission. If both of these studies are unremarkable she should be stable for discharge home today with follow-up in the [place]. If either of these studies are abnormal further assessment and intervention may be required.

## 2016-06-02 ENCOUNTER — Encounter (HOSPITAL_COMMUNITY)
Admission: EM | Disposition: A | Discharge status: Home / Self Care | Payer: Self-pay | Source: Home / Self Care | Attending: Cardiothoracic Surgery

## 2016-06-02 DIAGNOSIS — K219 Gastro-esophageal reflux disease without esophagitis: Secondary | ICD-10-CM | POA: Diagnosis not present

## 2016-06-02 DIAGNOSIS — I4891 Unspecified atrial fibrillation: Secondary | ICD-10-CM | POA: Diagnosis not present

## 2016-06-02 DIAGNOSIS — R197 Diarrhea, unspecified: Secondary | ICD-10-CM

## 2016-06-02 DIAGNOSIS — I2510 Atherosclerotic heart disease of native coronary artery without angina pectoris: Secondary | ICD-10-CM

## 2016-06-02 DIAGNOSIS — R9439 Abnormal result of other cardiovascular function study: Secondary | ICD-10-CM | POA: Diagnosis not present

## 2016-06-02 DIAGNOSIS — R0789 Other chest pain: Secondary | ICD-10-CM | POA: Diagnosis not present

## 2016-06-02 DIAGNOSIS — I252 Old myocardial infarction: Secondary | ICD-10-CM | POA: Diagnosis not present

## 2016-06-02 DIAGNOSIS — Z9889 Other specified postprocedural states: Secondary | ICD-10-CM

## 2016-06-02 HISTORY — PX: CARDIAC CATHETERIZATION: SHX172

## 2016-06-02 LAB — BASIC METABOLIC PANEL
ANION GAP: 9 (ref 5–15)
BUN: 8 mg/dL (ref 6–20)
CALCIUM: 8.8 mg/dL — AB (ref 8.9–10.3)
CO2: 21 mmol/L — AB (ref 22–32)
CREATININE: 0.86 mg/dL (ref 0.44–1.00)
Chloride: 105 mmol/L (ref 101–111)
Glucose, Bld: 121 mg/dL — ABNORMAL HIGH (ref 65–99)
Potassium: 3.2 mmol/L — ABNORMAL LOW (ref 3.5–5.1)
Sodium: 135 mmol/L (ref 135–145)

## 2016-06-02 LAB — CBC
HCT: 40 % (ref 36.0–46.0)
Hemoglobin: 12.8 g/dL (ref 12.0–15.0)
MCH: 27.5 pg (ref 26.0–34.0)
MCHC: 32 g/dL (ref 30.0–36.0)
MCV: 85.8 fL (ref 78.0–100.0)
PLATELETS: 174 10*3/uL (ref 150–400)
RBC: 4.66 MIL/uL (ref 3.87–5.11)
RDW: 15.6 % — AB (ref 11.5–15.5)
WBC: 6 10*3/uL (ref 4.0–10.5)

## 2016-06-02 LAB — PROTIME-INR
INR: 1.35 (ref 0.00–1.49)
PROTHROMBIN TIME: 16.8 s — AB (ref 11.6–15.2)

## 2016-06-02 LAB — HEPARIN LEVEL (UNFRACTIONATED): Heparin Unfractionated: 1.5 IU/mL — ABNORMAL HIGH (ref 0.30–0.70)

## 2016-06-02 LAB — APTT
aPTT: 61 seconds — ABNORMAL HIGH (ref 24–37)
aPTT: 114 seconds — ABNORMAL HIGH (ref 24–37)

## 2016-06-02 SURGERY — LEFT HEART CATH AND CORONARY ANGIOGRAPHY
Anesthesia: LOCAL

## 2016-06-02 MED ORDER — MIDAZOLAM HCL 2 MG/2ML IJ SOLN
INTRAMUSCULAR | Status: AC
  Filled 2016-06-02: qty 2

## 2016-06-02 MED ORDER — SODIUM CHLORIDE 0.9 % IV SOLN
INTRAVENOUS | Status: DC | PRN
  Administered 2016-06-02: 93.3 mL/h via INTRAVENOUS

## 2016-06-02 MED ORDER — ONDANSETRON HCL 4 MG/2ML IJ SOLN: 4.0000 mg | Freq: Four times a day (QID) | INTRAMUSCULAR | Status: DC | PRN

## 2016-06-02 MED ORDER — SODIUM CHLORIDE 0.9 % IV SOLN: 250.0000 mL | INTRAVENOUS | Status: DC | PRN

## 2016-06-02 MED ORDER — HEPARIN SODIUM (PORCINE) 1000 UNIT/ML IJ SOLN
INTRAMUSCULAR | Status: AC
  Filled 2016-06-02: qty 1

## 2016-06-02 MED ORDER — SODIUM CHLORIDE 0.9% FLUSH
3.0000 mL | Freq: Two times a day (BID) | INTRAVENOUS | Status: DC
  Administered 2016-06-02 – 2016-06-07 (×8): 3 mL via INTRAVENOUS

## 2016-06-02 MED ORDER — SODIUM CHLORIDE 0.9% FLUSH: 3.0000 mL | INTRAVENOUS | Status: DC | PRN

## 2016-06-02 MED ORDER — HEPARIN (PORCINE) IN NACL 100-0.45 UNIT/ML-% IJ SOLN
1150.0000 [IU]/h | INTRAMUSCULAR | Status: DC
  Administered 2016-06-03: 1150 [IU]/h via INTRAVENOUS
  Filled 2016-06-02: qty 250

## 2016-06-02 MED ORDER — POTASSIUM CHLORIDE CRYS ER 20 MEQ PO TBCR
EXTENDED_RELEASE_TABLET | ORAL | Status: AC
  Filled 2016-06-02: qty 2

## 2016-06-02 MED ORDER — FENTANYL CITRATE (PF) 100 MCG/2ML IJ SOLN
INTRAMUSCULAR | Status: AC
  Filled 2016-06-02: qty 2

## 2016-06-02 MED ORDER — VERAPAMIL HCL 2.5 MG/ML IV SOLN
INTRAVENOUS | Status: AC
  Filled 2016-06-02: qty 2

## 2016-06-02 MED ORDER — HEPARIN (PORCINE) IN NACL 2-0.9 UNIT/ML-% IJ SOLN
INTRAMUSCULAR | Status: DC | PRN
  Administered 2016-06-02: 18:00:00

## 2016-06-02 MED ORDER — IOPAMIDOL (ISOVUE-370) INJECTION 76%
INTRAVENOUS | Status: AC
  Filled 2016-06-02: qty 100

## 2016-06-02 MED ORDER — ASPIRIN 81 MG PO CHEW
81.0000 mg | CHEWABLE_TABLET | Freq: Every day | ORAL | Status: DC
  Administered 2016-06-03 – 2016-06-07 (×5): 81 mg via ORAL
  Filled 2016-06-02 (×5): qty 1

## 2016-06-02 MED ORDER — HEPARIN SODIUM (PORCINE) 1000 UNIT/ML IJ SOLN: INTRAMUSCULAR | Status: DC | PRN

## 2016-06-02 MED ORDER — NITROGLYCERIN 1 MG/10 ML FOR IR/CATH LAB
INTRA_ARTERIAL | Status: AC
  Filled 2016-06-02: qty 10

## 2016-06-02 MED ORDER — LIDOCAINE HCL (PF) 1 % IJ SOLN
INTRAMUSCULAR | Status: DC | PRN
  Administered 2016-06-02: 3 mL

## 2016-06-02 MED ORDER — ACETAMINOPHEN 325 MG PO TABS
650.0000 mg | ORAL_TABLET | ORAL | Status: DC | PRN
  Administered 2016-06-07: 650 mg via ORAL
  Filled 2016-06-02: qty 2

## 2016-06-02 MED ORDER — NITROGLYCERIN 1 MG/10 ML FOR IR/CATH LAB
INTRA_ARTERIAL | Status: DC | PRN
  Administered 2016-06-02: 200 ug via INTRACORONARY
  Administered 2016-06-02: 200 ug via INTRA_ARTERIAL

## 2016-06-02 MED ORDER — SODIUM CHLORIDE 0.9 % WEIGHT BASED INFUSION: 1.0000 mL/kg/h | INTRAVENOUS | Status: AC

## 2016-06-02 MED ORDER — LIDOCAINE HCL (PF) 1 % IJ SOLN
INTRAMUSCULAR | Status: AC
  Filled 2016-06-02: qty 30

## 2016-06-02 MED ORDER — POTASSIUM CHLORIDE CRYS ER 20 MEQ PO TBCR
40.0000 meq | EXTENDED_RELEASE_TABLET | Freq: Once | ORAL | Status: AC
  Administered 2016-06-02: 40 meq via ORAL

## 2016-06-02 MED ORDER — MIDAZOLAM HCL 2 MG/2ML IJ SOLN
INTRAMUSCULAR | Status: DC | PRN
  Administered 2016-06-02 (×3): 1 mg via INTRAVENOUS

## 2016-06-02 MED ORDER — HEPARIN (PORCINE) IN NACL 2-0.9 UNIT/ML-% IJ SOLN
INTRAMUSCULAR | Status: DC | PRN
  Administered 2016-06-02: 10 mL via INTRA_ARTERIAL

## 2016-06-02 MED ORDER — HEPARIN (PORCINE) IN NACL 2-0.9 UNIT/ML-% IJ SOLN
INTRAMUSCULAR | Status: AC
  Filled 2016-06-02: qty 1000

## 2016-06-02 MED ORDER — IOPAMIDOL (ISOVUE-370) INJECTION 76%
INTRAVENOUS | Status: DC | PRN
Start: 2016-06-02 — End: 2016-06-02
  Administered 2016-06-02: 80 mL

## 2016-06-02 MED ORDER — OXYCODONE-ACETAMINOPHEN 5-325 MG PO TABS
1.0000 | ORAL_TABLET | ORAL | Status: DC | PRN
  Administered 2016-06-02 – 2016-06-03 (×3): 1 via ORAL
  Filled 2016-06-02 (×3): qty 1

## 2016-06-02 MED ORDER — FENTANYL CITRATE (PF) 100 MCG/2ML IJ SOLN
INTRAMUSCULAR | Status: DC | PRN
  Administered 2016-06-02 (×2): 50 ug via INTRAVENOUS

## 2016-06-02 SURGICAL SUPPLY — 10 items
CATH INFINITI JR4 5F (CATHETERS) ×1 IMPLANT
CATH INFINITI MULTIPACK ANG 4F (CATHETERS) ×1 IMPLANT
DEVICE RAD COMP TR BAND LRG (VASCULAR PRODUCTS) ×1 IMPLANT
GLIDESHEATH SLEND A-KIT 6F 22G (SHEATH) ×1 IMPLANT
KIT HEART LEFT (KITS) ×2 IMPLANT
PACK CARDIAC CATHETERIZATION (CUSTOM PROCEDURE TRAY) ×2 IMPLANT
TRANSDUCER W/STOPCOCK (MISCELLANEOUS) ×2 IMPLANT
TUBING CIL FLEX 10 FLL-RA (TUBING) ×2 IMPLANT
WIRE HI TORQ VERSACORE-J 145CM (WIRE) ×1 IMPLANT
WIRE SAFE-T 1.5MM-J .035X260CM (WIRE) ×1 IMPLANT

## 2016-06-02 NOTE — Progress Notes (Signed)
ANTICOAGULATION CONSULT NOTE - Follow Up Consult  Pharmacy Consult for Heparin (apixaban on hold) Indication: chest pain/ACS, afib  No Known Allergies  Patient Measurements: Height: _0  (157.5 cm) Weight: 205 lb 12.8 oz (93.35 kg) IBW/kg (Calculated) : 50.1  Vital Signs: Temp: 98.4 F (36.9 C) (07/18 0527) Temp Source: Oral (07/18 0527) BP: 114/66 mmHg (07/18 0527) Pulse Rate: 73 (07/18 0527)  Labs:  Recent Labs  05/30/16 2247 05/31/16 0443  05/31/16 1742 05/31/16 2329 06/01/16 0514 06/01/16 1905 06/02/16 0456  HGB 14.2 13.5  --   --   --   --   --  12.8  HCT 45.1 43.2  --   --   --   --   --  40.0  PLT 224 198  --   --   --   --   --  174  APTT  --   --   --   --   --   --  39* 61*  HEPARINUNFRC  --   --   --   --   --   --  >2.20*  --   CREATININE 0.78 0.87  --   --   --   --   --  0.86  TROPONINI  --  <0.03  < > <0.03 <0.03 0.03*  --   --   < > = values in this interval not displayed.  Estimated Creatinine Clearance: 75.9 mL/min (by C-G formula based on Cr of 0.86).   Assessment: Heparin while apixaban on hold, using aPTT to dose given apixaban influence on anti-Xa level (baseline was elevated at >2.2). APTT is sub-therapeutic this AM at 61.   Goal of Therapy:  Heparin level 0.3-0.7 units/ml aPTT 66-102 seconds Monitor platelets by anticoagulation protocol: Yes   Plan:  -Increase heparin to 1250 units/hr -1400 aPTT  Narda Bonds 06/02/2016,6:22 AM

## 2016-06-02 NOTE — Progress Notes (Signed)
  SUBJECTIVE:  No chest pain.  No distress   PHYSICAL EXAM Filed Vitals:   06/01/16 1402 06/01/16 1942 06/02/16 0527 06/02/16 1115  BP: 100/64 106/84 114/66 138/70  Pulse:  63 73   Temp:  97.7 F (36.5 C) 98.4 F (36.9 C)   TempSrc:  Oral Oral   Resp:  20 20   Height:      Weight:   205 lb 12.8 oz (93.35 kg)   SpO2:  96% 95%    General:  No distress Lungs:  Clear Heart:  RRR Abdomen:  Positive bowel sounds, no rebound no guarding Extremities:  No edema   LABS: Lab Results  Component Value Date   TROPONINI 0.03* 06/01/2016   Results for orders placed or performed during the hospital encounter of 05/30/16 (from the past 24 hour(s))  APTT     Status: Abnormal   Collection Time: 06/01/16  7:05 PM  Result Value Ref Range   aPTT 39 (H) 24 - 37 seconds  Heparin level (unfractionated)     Status: Abnormal   Collection Time: 06/01/16  7:05 PM  Result Value Ref Range   Heparin Unfractionated >2.20 (H) 0.30 - 0.70 IU/mL  Heparin level (unfractionated)     Status: Abnormal   Collection Time: 06/02/16  4:56 AM  Result Value Ref Range   Heparin Unfractionated 1.50 (H) 0.30 - 0.70 IU/mL  CBC     Status: Abnormal   Collection Time: 06/02/16  4:56 AM  Result Value Ref Range   WBC 6.0 4.0 - 10.5 K/uL   RBC 4.66 3.87 - 5.11 MIL/uL   Hemoglobin 12.8 12.0 - 15.0 g/dL   HCT 40.0 36.0 - 46.0 %   MCV 85.8 78.0 - 100.0 fL   MCH 27.5 26.0 - 34.0 pg   MCHC 32.0 30.0 - 36.0 g/dL   RDW 15.6 (H) 11.5 - 15.5 %   Platelets 174 150 - 400 K/uL  Basic metabolic panel     Status: Abnormal   Collection Time: 06/02/16  4:56 AM  Result Value Ref Range   Sodium 135 135 - 145 mmol/L   Potassium 3.2 (L) 3.5 - 5.1 mmol/L   Chloride 105 101 - 111 mmol/L   CO2 21 (L) 22 - 32 mmol/L   Glucose, Bld 121 (H) 65 - 99 mg/dL   BUN 8 6 - 20 mg/dL   Creatinine, Ser 0.86 0.44 - 1.00 mg/dL   Calcium 8.8 (L) 8.9 - 10.3 mg/dL   GFR calc non Af Amer >60 >60 mL/min   GFR calc Af Amer >60 >60 mL/min   Anion  gap 9 5 - 15  APTT     Status: Abnormal   Collection Time: 06/02/16  4:56 AM  Result Value Ref Range   aPTT 61 (H) 24 - 37 seconds  Protime-INR     Status: Abnormal   Collection Time: 06/02/16  4:56 AM  Result Value Ref Range   Prothrombin Time 16.8 (H) 11.6 - 15.2 seconds   INR 1.35 0.00 - 1.49   No intake or output data in the 24 hours ending 06/02/16 1119  ASSESSMENT AND PLAN:    CHEST PAIN/ABNORMAL STRESS TEST:   Cath today.    HTN:    The blood pressure is at target. No change in medications is indicated.   PAF:   Maintaining NSR, Elizabeth [name] has a CHA2DS2 - VASc score of 3.  Resume Eliquis after cath.  Cath will be radial.    She has missed two doses .     Elizabeth [name] 06/02/2016 11:19 AM   

## 2016-06-02 NOTE — Progress Notes (Signed)
Subjective: Pt. C/O watery diarrhea since yesterday evening which she attribute to her stress test yesterday, she had 3 watery BM since this morning. Objective: Vital signs in last 24 hours: Filed Vitals:   06/01/16 1942 06/02/16 0527 06/02/16 1115 06/02/16 1306  BP: 106/84 114/66 138/70 140/73  Pulse: 63 73  75  Temp: 97.7 F (36.5 C) 98.4 F (36.9 C)  98.7 F (37.1 C)  TempSrc: Oral Oral  Oral  Resp: _0 Height:      Weight:  205 lb 12.8 oz (93.35 kg)    SpO2: 96% 95%  95%   Physical Exam Physical Exam General: Well built lady, lying comfortably in bed. Chest: Clear, no added sounds. CVS: RRR< No r/m/g. Abdomen: Soft, mildly tender epigastrium and RUQ, no guarding, no rebound. BS+ve  Extremities: Mild edema and tenderness at Rt. Posterior first two metacarpophalangeal joints. No pedal edema,no cyanosis. PP +ve. Bilaterally.  MEDS: Reviewed  Labs:  CBC Latest Ref Rng 06/02/2016 05/31/2016 05/30/2016  WBC 4.0 - 10.5 K/uL 6.0 11.6(H) 10.3  Hemoglobin 12.0 - 15.0 g/dL 12.8 13.5 14.2  Hematocrit 36.0 - 46.0 % 40.0 43.2 45.1  Platelets 150 - 400 K/uL 174 198 224   BMP Latest Ref Rng 06/02/2016 05/31/2016 05/30/2016  Glucose 65 - 99 mg/dL 121(H) 119(H) 98  BUN 6 - 20 mg/dL _1 Creatinine 0.44 - 1.00 mg/dL 0.86 0.87 0.78  Sodium 135 - 145 mmol/L 135 136 138  Potassium 3.5 - 5.1 mmol/L 3.2(L) 3.4(L) 3.2(L)  Chloride 101 - 111 mmol/L 105 99(L) 101  CO2 22 - 32 mmol/L 21(L) 24 28  Calcium 8.9 - 10.3 mg/dL 8.8(L) 9.0 9.3   DG Chest: FINDINGS: The heart size and mediastinal contours are within normal limits. Both lungs are clear. No pneumothorax or pleural effusion is noted. The visualized skeletal structures are unremarkable.  IMPRESSION: No active cardiopulmonary disease.  NM Myocar Multi W Etheleen Sia W/Wall Motion/EF: FINDINGS: Perfusion: Decreased activity in the high lateral wall on both sets of images but more pronounced on the stress images. There is  also anterior wall reversibility  Wall Motion: Wall motion abnormality involving the upper lateral wall.  Left Ventricular Ejection Fraction: 49 %  End diastolic volume 599 ml  End systolic volume 61 ml  IMPRESSION: 1. Findings suggest high lateral wall scar with peri-infarct ischemia. There is also anterior wall ischemia.  2. Upper lateral wall motion abnormality.  3. Left ventricular ejection fraction 49%  4. Non invasive risk stratification*: High risk   Assessment/Plan:  CAD with H/O MI; With her PMH of MI with bare stent placement and her last Trop. Was 0.03 as compare to previous two <0.03. Stress test done yesterday came back high risk with EF 49%, high lateral wall scar with peri-infarct ischemia, anterior wall ischemia. Cardiology has been consulted for abnormal stress test. She is going for cardiac cath. Today. We will proceed according to the Cath. Results and her cardiologist recommendation.  Diarrhea: She is having loose watery stools since yesterday,might be due to medicine S/E which was used during her stress test. Looks like resolving this morning. We will watch and give her imodium if it continue to bother her.  GERD-related Chest Pain: Pretty much resolved, still having mild epigastric discomfort. Continue Protonix We will give pt. A list of food to avoid for her reflux.  RUL infiltrate on previous CXR? Her recent CXR didn't show any infiltrate.    Paroxysmal AFib: HR stable in  70s-90s. - Continue Eliquis  HTN: BPs stable in 110s-120s. - Continue home Coreg, Lasix, and Lisinopril   Anxiety and Depression:  - Continue home Zoloft   Dispo: Anticipated discharge in approximately 1-2 day(s).     Lorella Nimrod, MD 06/02/2016, 1:31 PM Pager: [phone]

## 2016-06-02 NOTE — Interval H&P Note (Signed)
Cath Lab Visit (complete for each Cath Lab visit)  Clinical Evaluation Leading to the Procedure:   ACS: Yes.    Non-ACS:    Anginal Classification: CCS III  Anti-ischemic medical therapy: Maximal Therapy (2 or more classes of medications)  Non-Invasive Test Results: No non-invasive testing performed  Prior CABG: No previous CABG      History and Physical Interval Note:  06/02/2016 2:43 PM  Elizabeth [name]  has presented today for surgery, with the diagnosis of unstable angina  The various methods of treatment have been discussed with the patient and family. After consideration of risks, benefits and other options for treatment, the patient has consented to  Procedure(s): Left Heart Cath and Coronary Angiography (N/A) as a surgical intervention .  The patient's history has been reviewed, patient examined, no change in status, stable for surgery.  I have reviewed the patient's chart and labs.  Questions were answered to the patient's satisfaction.     Belva Crome III

## 2016-06-02 NOTE — Progress Notes (Signed)
Internal Medicine Attending  Date: 06/02/2016  Patient name: Elizabeth [name] Medical record number: [id number] Date of birth: [birth year] Age: 60 y.o. Gender: female  I saw and evaluated the patient. I reviewed the resident's note by Dr. Reesa Chew and I agree with the resident's findings and plans as documented in her progress note.  [name] has undergone a cardiac catheterization today to further assess her atypical chest pain and high-risk Myoview. The results of this catheterization are pending at this moment. Further evaluation and care and or discharge home her pending these results.

## 2016-06-02 NOTE — H&P (View-Only) (Signed)
SUBJECTIVE:  No chest pain.  No distress   PHYSICAL EXAM Filed Vitals:   06/01/16 1402 06/01/16 1942 06/02/16 0527 06/02/16 1115  BP: 100/64 106/84 114/66 138/70  Pulse:  63 73   Temp:  97.7 F (36.5 C) 98.4 F (36.9 C)   TempSrc:  Oral Oral   Resp:  20 20   Height:      Weight:   205 lb 12.8 oz (93.35 kg)   SpO2:  96% 95%    General:  No distress Lungs:  Clear Heart:  RRR Abdomen:  Positive bowel sounds, no rebound no guarding Extremities:  No edema   LABS: Lab Results  Component Value Date   TROPONINI 0.03* 06/01/2016   Results for orders placed or performed during the hospital encounter of 05/30/16 (from the past 24 hour(s))  APTT     Status: Abnormal   Collection Time: 06/01/16  7:05 PM  Result Value Ref Range   aPTT 39 (H) 24 - 37 seconds  Heparin level (unfractionated)     Status: Abnormal   Collection Time: 06/01/16  7:05 PM  Result Value Ref Range   Heparin Unfractionated >2.20 (H) 0.30 - 0.70 IU/mL  Heparin level (unfractionated)     Status: Abnormal   Collection Time: 06/02/16  4:56 AM  Result Value Ref Range   Heparin Unfractionated 1.50 (H) 0.30 - 0.70 IU/mL  CBC     Status: Abnormal   Collection Time: 06/02/16  4:56 AM  Result Value Ref Range   WBC 6.0 4.0 - 10.5 K/uL   RBC 4.66 3.87 - 5.11 MIL/uL   Hemoglobin 12.8 12.0 - 15.0 g/dL   HCT 40.0 36.0 - 46.0 %   MCV 85.8 78.0 - 100.0 fL   MCH 27.5 26.0 - 34.0 pg   MCHC 32.0 30.0 - 36.0 g/dL   RDW 15.6 (H) 11.5 - 15.5 %   Platelets 174 150 - 400 K/uL  Basic metabolic panel     Status: Abnormal   Collection Time: 06/02/16  4:56 AM  Result Value Ref Range   Sodium 135 135 - 145 mmol/L   Potassium 3.2 (L) 3.5 - 5.1 mmol/L   Chloride 105 101 - 111 mmol/L   CO2 21 (L) 22 - 32 mmol/L   Glucose, Bld 121 (H) 65 - 99 mg/dL   BUN 8 6 - 20 mg/dL   Creatinine, Ser 0.86 0.44 - 1.00 mg/dL   Calcium 8.8 (L) 8.9 - 10.3 mg/dL   GFR calc non Af Amer >60 >60 mL/min   GFR calc Af Amer >60 >60 mL/min   Anion  gap 9 5 - 15  APTT     Status: Abnormal   Collection Time: 06/02/16  4:56 AM  Result Value Ref Range   aPTT 61 (H) 24 - 37 seconds  Protime-INR     Status: Abnormal   Collection Time: 06/02/16  4:56 AM  Result Value Ref Range   Prothrombin Time 16.8 (H) 11.6 - 15.2 seconds   INR 1.35 0.00 - 1.49   No intake or output data in the 24 hours ending 06/02/16 1119  ASSESSMENT AND PLAN:    CHEST PAIN/ABNORMAL STRESS TEST:   Cath today.    HTN:    The blood pressure is at target. No change in medications is indicated.   PAF:   Maintaining NSR, [name] has a CHA2DS2 - VASc score of 3.  Resume Eliquis after cath.  Cath will be radial.  She has missed two doses .     Jeneen Rinks [facility] 06/02/2016 11:19 AM

## 2016-06-02 NOTE — Progress Notes (Signed)
ANTICOAGULATION CONSULT NOTE  Pharmacy Consult:  Heparin Indication: chest pain/ACS  No Known Allergies  Patient Measurements: Height: 5' 2" (157.5 cm) Weight: 205 lb 12.8 oz (93.35 kg) IBW/kg (Calculated) : 50.1 Heparin Dosing Weight: 72 kg  Vital Signs: Temp: 98.7 F (37.1 C) (07/18 1306) Temp Source: Oral (07/18 1306) BP: 148/91 mmHg (07/18 1749) Pulse Rate: 69 (07/18 1754)  Labs:  Recent Labs  05/30/16 2247 05/31/16 0443  05/31/16 1742 05/31/16 2329 06/01/16 0514 06/01/16 1905 06/02/16 0456 06/02/16 1328  HGB 14.2 13.5  --   --   --   --   --  12.8  --   HCT 45.1 43.2  --   --   --   --   --  40.0  --   PLT 224 198  --   --   --   --   --  174  --   APTT  --   --   --   --   --   --  39* 61* 114*  LABPROT  --   --   --   --   --   --   --  16.8*  --   INR  --   --   --   --   --   --   --  1.35  --   HEPARINUNFRC  --   --   --   --   --   --  >2.20* 1.50*  --   CREATININE 0.78 0.87  --   --   --   --   --  0.86  --   TROPONINI  --  <0.03  < > <0.03 <0.03 0.03*  --   --   --   < > = values in this interval not displayed.  Estimated Creatinine Clearance: 75.9 mL/min (by C-G formula based on Cr of 0.86).    Assessment: 53 YOF on Eliquis PTA for history of AFib.  Patient was transitioned to IV heparin for ACS.  No s/p cath and Pharmacy consulted to resume heparin 8 hours post sheath removal.  Sheath removed around 1800; no bleeding nor hematoma per RN.  Patient was previously supra-therapeutic on heparin at 1250 units/hr.   Goal of Therapy:  aPTT 66-102 seconds  HL 0.3-0.7 units/mL Monitor platelets by anticoagulation protocol: Yes    Plan:  - On 06/03/16 at 0200, resume heparin gtt at 1150 units/hr - Check aPTT and HL 6 hours post heparin resumed - Daily HL / CBC / aPTT - F/U with order to resume Eliquis   Dailey Buccheri D. Mina Marble, PharmD, BCPS Pager:  [phone] 06/02/2016, 6:16 PM

## 2016-06-02 NOTE — Progress Notes (Signed)
ANTICOAGULATION CONSULT NOTE  Pharmacy Consult for heparin Indication: chest pain/ACS  No Known Allergies  Patient Measurements: Height: _0  (157.5 cm) Weight: 205 lb 12.8 oz (93.35 kg) IBW/kg (Calculated) : 50.1 Heparin Dosing Weight: 71.7kg  Vital Signs: Temp: 98.7 F (37.1 C) (07/18 1306) Temp Source: Oral (07/18 1306) BP: 140/73 mmHg (07/18 1306) Pulse Rate: 75 (07/18 1306)  Labs:  Recent Labs  05/30/16 2247 05/31/16 0443  05/31/16 1742 05/31/16 2329 06/01/16 0514 06/01/16 1905 06/02/16 0456 06/02/16 1328  HGB 14.2 13.5  --   --   --   --   --  12.8  --   HCT 45.1 43.2  --   --   --   --   --  40.0  --   PLT 224 198  --   --   --   --   --  174  --   APTT  --   --   --   --   --   --  39* 61* 114*  LABPROT  --   --   --   --   --   --   --  16.8*  --   INR  --   --   --   --   --   --   --  1.35  --   HEPARINUNFRC  --   --   --   --   --   --  >2.20* 1.50*  --   CREATININE 0.78 0.87  --   --   --   --   --  0.86  --   TROPONINI  --  <0.03  < > <0.03 <0.03 0.03*  --   --   --   < > = values in this interval not displayed.  Estimated Creatinine Clearance: 75.9 mL/min (by C-G formula based on Cr of 0.86).  Assessment: Apixaban changed to heparin per pharmacy for CP. Last dose of Eliquis ~1030 7/17.   First aPTT low, now elevated at 114 seconds on 1250 units/hr. Patient already transferred to cath.   CBC stable, no bleeding noted  Goal of Therapy:  aPTT 66-102 seconds  HL 0.3-0.7 Monitor platelets by anticoagulation protocol: Yes   Plan:  -f/u post-cath. Per note from Dr. Percival Spanish this morning plan is to resume Eliquis  Konstantinos Cordoba D. Shakhia Gramajo, PharmD, BCPS Clinical Pharmacist Pager: [phone] 06/02/2016 2:33 PM

## 2016-06-03 ENCOUNTER — Other Ambulatory Visit: Payer: Self-pay | Admitting: *Deleted

## 2016-06-03 ENCOUNTER — Other Ambulatory Visit (HOSPITAL_COMMUNITY): Payer: Self-pay | Admitting: Respiratory Therapy

## 2016-06-03 ENCOUNTER — Encounter (HOSPITAL_COMMUNITY): Payer: Self-pay | Admitting: Interventional Cardiology

## 2016-06-03 DIAGNOSIS — D62 Acute posthemorrhagic anemia: Secondary | ICD-10-CM | POA: Diagnosis not present

## 2016-06-03 DIAGNOSIS — I5042 Chronic combined systolic (congestive) and diastolic (congestive) heart failure: Secondary | ICD-10-CM | POA: Diagnosis not present

## 2016-06-03 DIAGNOSIS — R319 Hematuria, unspecified: Secondary | ICD-10-CM | POA: Diagnosis present

## 2016-06-03 DIAGNOSIS — M329 Systemic lupus erythematosus, unspecified: Secondary | ICD-10-CM | POA: Diagnosis not present

## 2016-06-03 DIAGNOSIS — I509 Heart failure, unspecified: Secondary | ICD-10-CM | POA: Diagnosis not present

## 2016-06-03 DIAGNOSIS — I2510 Atherosclerotic heart disease of native coronary artery without angina pectoris: Secondary | ICD-10-CM | POA: Diagnosis not present

## 2016-06-03 DIAGNOSIS — I959 Hypotension, unspecified: Secondary | ICD-10-CM | POA: Diagnosis not present

## 2016-06-03 DIAGNOSIS — I255 Ischemic cardiomyopathy: Secondary | ICD-10-CM | POA: Diagnosis not present

## 2016-06-03 DIAGNOSIS — Z7901 Long term (current) use of anticoagulants: Secondary | ICD-10-CM | POA: Diagnosis not present

## 2016-06-03 DIAGNOSIS — F17210 Nicotine dependence, cigarettes, uncomplicated: Secondary | ICD-10-CM | POA: Diagnosis present

## 2016-06-03 DIAGNOSIS — K219 Gastro-esophageal reflux disease without esophagitis: Secondary | ICD-10-CM | POA: Diagnosis not present

## 2016-06-03 DIAGNOSIS — I480 Paroxysmal atrial fibrillation: Secondary | ICD-10-CM | POA: Diagnosis not present

## 2016-06-03 DIAGNOSIS — R51 Headache: Secondary | ICD-10-CM

## 2016-06-03 DIAGNOSIS — D259 Leiomyoma of uterus, unspecified: Secondary | ICD-10-CM | POA: Diagnosis present

## 2016-06-03 DIAGNOSIS — I5043 Acute on chronic combined systolic (congestive) and diastolic (congestive) heart failure: Secondary | ICD-10-CM | POA: Diagnosis not present

## 2016-06-03 DIAGNOSIS — I252 Old myocardial infarction: Secondary | ICD-10-CM | POA: Diagnosis not present

## 2016-06-03 DIAGNOSIS — R748 Abnormal levels of other serum enzymes: Secondary | ICD-10-CM | POA: Diagnosis not present

## 2016-06-03 DIAGNOSIS — Z955 Presence of coronary angioplasty implant and graft: Secondary | ICD-10-CM | POA: Diagnosis not present

## 2016-06-03 DIAGNOSIS — I10 Essential (primary) hypertension: Secondary | ICD-10-CM | POA: Diagnosis not present

## 2016-06-03 DIAGNOSIS — Z4682 Encounter for fitting and adjustment of non-vascular catheter: Secondary | ICD-10-CM | POA: Diagnosis not present

## 2016-06-03 DIAGNOSIS — I110 Hypertensive heart disease with heart failure: Secondary | ICD-10-CM | POA: Diagnosis not present

## 2016-06-03 DIAGNOSIS — E119 Type 2 diabetes mellitus without complications: Secondary | ICD-10-CM | POA: Diagnosis present

## 2016-06-03 DIAGNOSIS — I25110 Atherosclerotic heart disease of native coronary artery with unstable angina pectoris: Secondary | ICD-10-CM | POA: Diagnosis not present

## 2016-06-03 DIAGNOSIS — E785 Hyperlipidemia, unspecified: Secondary | ICD-10-CM | POA: Diagnosis not present

## 2016-06-03 DIAGNOSIS — J810 Acute pulmonary edema: Secondary | ICD-10-CM | POA: Diagnosis not present

## 2016-06-03 DIAGNOSIS — I4891 Unspecified atrial fibrillation: Secondary | ICD-10-CM | POA: Diagnosis not present

## 2016-06-03 DIAGNOSIS — I25799 Atherosclerosis of other coronary artery bypass graft(s) with unspecified angina pectoris: Secondary | ICD-10-CM | POA: Diagnosis not present

## 2016-06-03 DIAGNOSIS — R079 Chest pain, unspecified: Secondary | ICD-10-CM | POA: Diagnosis not present

## 2016-06-03 DIAGNOSIS — A599 Trichomoniasis, unspecified: Secondary | ICD-10-CM | POA: Diagnosis not present

## 2016-06-03 DIAGNOSIS — Z452 Encounter for adjustment and management of vascular access device: Secondary | ICD-10-CM | POA: Diagnosis not present

## 2016-06-03 DIAGNOSIS — K760 Fatty (change of) liver, not elsewhere classified: Secondary | ICD-10-CM | POA: Diagnosis not present

## 2016-06-03 DIAGNOSIS — M797 Fibromyalgia: Secondary | ICD-10-CM | POA: Diagnosis present

## 2016-06-03 DIAGNOSIS — E876 Hypokalemia: Secondary | ICD-10-CM | POA: Diagnosis not present

## 2016-06-03 DIAGNOSIS — I2582 Chronic total occlusion of coronary artery: Secondary | ICD-10-CM | POA: Diagnosis not present

## 2016-06-03 DIAGNOSIS — I080 Rheumatic disorders of both mitral and aortic valves: Secondary | ICD-10-CM | POA: Diagnosis not present

## 2016-06-03 DIAGNOSIS — I472 Ventricular tachycardia: Secondary | ICD-10-CM | POA: Diagnosis not present

## 2016-06-03 DIAGNOSIS — J90 Pleural effusion, not elsewhere classified: Secondary | ICD-10-CM | POA: Diagnosis not present

## 2016-06-03 DIAGNOSIS — Z951 Presence of aortocoronary bypass graft: Secondary | ICD-10-CM | POA: Diagnosis not present

## 2016-06-03 DIAGNOSIS — R0602 Shortness of breath: Secondary | ICD-10-CM | POA: Diagnosis not present

## 2016-06-03 LAB — CBC
HCT: 39.2 % (ref 36.0–46.0)
Hemoglobin: 12.2 g/dL (ref 12.0–15.0)
MCH: 26.6 pg (ref 26.0–34.0)
MCHC: 31.1 g/dL (ref 30.0–36.0)
MCV: 85.6 fL (ref 78.0–100.0)
PLATELETS: 190 10*3/uL (ref 150–400)
RBC: 4.58 MIL/uL (ref 3.87–5.11)
RDW: 15.6 % — AB (ref 11.5–15.5)
WBC: 7.4 10*3/uL (ref 4.0–10.5)

## 2016-06-03 LAB — APTT: APTT: 91 s — AB (ref 24–37)

## 2016-06-03 LAB — HEPARIN LEVEL (UNFRACTIONATED): Heparin Unfractionated: 0.59 IU/mL (ref 0.30–0.70)

## 2016-06-03 MED ORDER — ENOXAPARIN SODIUM 100 MG/ML ~~LOC~~ SOLN
90.0000 mg | Freq: Two times a day (BID) | SUBCUTANEOUS | Status: DC
  Administered 2016-06-03 – 2016-06-04 (×2): 90 mg via SUBCUTANEOUS
  Filled 2016-06-03 (×2): qty 1

## 2016-06-03 MED ORDER — ASPIRIN-ACETAMINOPHEN-CAFFEINE 250-250-65 MG PO TABS
2.0000 | ORAL_TABLET | Freq: Four times a day (QID) | ORAL | Status: DC | PRN
  Administered 2016-06-03 – 2016-06-04 (×2): 2 via ORAL
  Filled 2016-06-03 (×4): qty 2

## 2016-06-03 MED ORDER — ENOXAPARIN SODIUM 100 MG/ML ~~LOC~~ SOLN: 90.0000 mg | Freq: Once | SUBCUTANEOUS | Status: DC

## 2016-06-03 MED ORDER — FLUTICASONE PROPIONATE 50 MCG/ACT NA SUSP
2.0000 | Freq: Every day | NASAL | Status: DC
  Administered 2016-06-03 – 2016-06-07 (×4): 2 via NASAL
  Filled 2016-06-03: qty 16

## 2016-06-03 MED FILL — Heparin Sodium (Porcine) Inj 1000 Unit/ML: INTRAMUSCULAR | Qty: 10 | Status: AC

## 2016-06-03 NOTE — Progress Notes (Signed)
Subjective: Pt. C/O frontal headache which increased with coughing, states that simple tylenol don't work for her, denies any sinus congestion, she occasionally have yellow colored mucous.  Objective: Vital signs in last 24 hours: Filed Vitals:   06/02/16 2231 06/02/16 2300 06/03/16 0453 06/03/16 0504  BP: 169/85 160/77  162/82  Pulse:    79  Temp:    99.7 F (37.6 C)  TempSrc:    Oral  Resp:    24  Height:      Weight:   206 lb 12.8 oz (93.804 kg)   SpO2: 99%   100%   Physical Exam General: Well built lady, lying comfortably in bed. HENT: Mild frontal sinus tenderness. Chest: Clear, no added sounds. CVS: RRR< No r/m/g. Abdomen: Soft, mildly tender epigastrium , no guarding, no rebound. BS+ve  Extremities:No pedal edema,no cyanosis. PP +ve. Bilaterally.  Labs: CBC Latest Ref Rng 06/03/2016 06/02/2016 05/31/2016  WBC 4.0 - 10.5 K/uL 7.4 6.0 11.6(H)  Hemoglobin 12.0 - 15.0 g/dL 12.2 12.8 13.5  Hematocrit 36.0 - 46.0 % 39.2 40.0 43.2  Platelets 150 - 400 K/uL 190 174 198   Heparin : 1.50 APTT  114  Left Heart Cath and Coronary Angiography: Conclusion    1. Prox Cx to Dist Cx lesion, 40% stenosed. The lesion was previously treated with a stent (unknown type). 2. Dist Cx lesion, 90% stenosed. 3. Ost 2nd Mrg to 2nd Mrg lesion, 85% stenosed. 4. Mid RCA lesion, 100% stenosed. 5. Dist RCA lesion, 100% stenosed. 6. Ost 1st Diag to 1st Diag lesion, 90% stenosed. 7. Ost 2nd Diag to 2nd Diag lesion, 90% stenosed. 8. Prox LAD to Mid LAD lesion, 50% stenosed. 9. Dist LAD lesion, 80% stenosed. 10. 1st Sept lesion, 70% stenosed.   Recent high risk myocardial perfusion study with evidence of prior lateral wall infarction, and anterior ischemia. Previous imaging and also demonstrated inferior wall scar.  70-80% obstruction relatively focal in the mid LAD. High-grade obstruction each of 2 moderate size diagonal branches.  High-grade obstruction beyond the previously placed  bare-metal stent in the mid circumflex with 85% obtuse marginal and 90% obstruction in the continuation of circumflex beyond the obtuse marginal.  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  Mid anterior wall hypokinesis, with estimated ejection fraction in the 40-45% range. 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   RECOMMENDATIONS:   Severe three-vessel coronary disease with chronic occlusion of a small to moderate size right coronary collateralized from left to right, relatively focal 80% mid LAD stenosis and high-grade obstruction in the first and second diagonal branches, and high-grade obstruction in the mid to distal circumflex beyond the previously placed bare metal stent. Given risk factors and decreased LV function, coronary artery bypass grafting should be considered. Because of the late completion of the case, the consult was not called and will need to be done in the a.m. by the treating team.  If she is not an appropriate surgical candidate, coronary intervention on the mid LAD could be considered. The diagonals are not treatable. The circumflex beyond the mid vessel stent would also be difficult to treat.   Assessment/Plan:  Severe three vessel CAD:She had her cardiac cath. Yesterday which shows Severe three-vessel coronary disease with chronic occlusion of a small to moderate size right coronary collateralized from left to right, relatively focal 80% mid LAD stenosis and high-grade obstruction in the first and second diagonal branches, and high-grade  obstruction in the mid to distal circumflex beyond the previously placed bare metal stent. Given risk factors and decreased LV function, coronary artery bypass grafting should be considered. We talked with the patient today,she understands her results and willing to go for surgery.[facility] being consulted. Her Eliquis was stopped on 06/01/16 AM  and she was started on heparin infusion after her stress test. We will stopped heparin infusion and start her on therapeutic Lovenox . Eliquis should remain discontinue at this time due to her possible cardiac surgery.  Paroxysmal AFib: HR being in 70 es.Discontinue Eliquis and start her on therapeutic Lovenox.  GERD-related Chest Pain: Pretty much resolved, still having mild epigastric discomfort. Continue Protonix.  Headaches: Looks like sinus headaches. There might be a stress element due to her current diagnosis. Start Flonase and Excedrin PRN.  HTN: BPs stable in 110s-120s. She had one reading this morning with BP 162/82 ,which was decreased to 113/71 after her meds. Continue Coreg, Lasix, and Lisinopril  Anxiety and Depression:  Continue home Zoloft   Dispo:Her discharge depends on [facility] decision about her surgery.  Lorella Nimrod, MD 06/03/2016, 8:49 AM Pager: [phone]

## 2016-06-03 NOTE — Consult Note (Signed)
[address]       [place] [address]             [phone]        Angellica P Grau [facility] Medical Record #[id number] Date of Birth: [birth year]  Referring: Daneen Schick Primary Care: Ledell Noss, MD  Chief Complaint:    Chief Complaint  Patient presents with  . Chest Pain    History of Present Illness:      Ms. Bourassa is a 60 yo morbidly obese AA female with known history of CAD, Paroxysmal Atrial Fibrillation,  HTN, Hyperlipidemia, TIA,  Lupus, Fibromyalgia, and GERD.  Her CAD started in 2002 at which time she presented with an MI, + cocaine abuse leading to PCI with bare metal stent placement to her Circumflex vessel.  She has been on Eliquis, but states this has not been taken in 3 days.  She presented to the ED on 05/31/2016 with complaints of chest/abdominla pain, and headache.  She stated she had been in her normal state of health until about a week ago at which point she had run out of her medications.  She developed chest pain the afternoon of presentation, however she mentions she has been having epigastric pain with vomiting for th past 2 days.  EKG showed no changes, Troponin was negative, and CXR was concerning for possible pneumonia.  She was started on ABX, oxygen levels were low and she was placed on oxygen.  She was admitted via medicine for further workup.   It was felt her symptoms were likely reflux related.  She was restarted on PPI therapy.  Serial Troponin levels became very mildly positive.  It was felt due to her history of CAD she should have a stress test.  This was done and showed anterior ischemia concerning for progression of known disease.  It was felt she should undergo cardiac catheterization for better look at coronary arteries.  This was done on 06/02/2016 and showed  Multivessel CAD.  It was felt coronary bypass grafting would be indicated and TCTS consult was obtained.  Currently the patient is chest pain free.  She states she did  have an episode of dyspnea since she has been here.  She states she is currently not using cocaine and has not used in the last year.  She denies use of pain medications and drugs currently, however her toxicology screen is positive for Opiods.  She continues to smoke several cigarettes per day and states she has smoked for a long time.    Current Activity/ Functional Status: Patient is independent with mobility/ambulation, transfers, ADL's, IADL's.   Zubrod Score: At the time of surgery this patient's most appropriate activity status/level should be described as: _0     0    Normal activity, no symptoms _1     1    Restricted in physical strenuous activity but ambulatory, able to do out light work _2     2    Ambulatory and capable of self care, unable to do work activities, up and about                 more than 50%  Of the time                            _3     3    Only limited self care, in bed greater than 50% of waking hours _4   4    Completely disabled, no self care, confined to bed or chair _0     5    Moribund  Past Medical History  Diagnosis Date  . Hypertension   . HLD (hyperlipidemia)     Chol = 235, LDL = 156 (08/2010)  . Substance abuse     cocaine   . Lupus ([facility]) 2009    ANA + 10/2008, repeat ANA + (05/2009), on that visit 05/2009 following labs obtained RF <20, CRP <0.4, ANA titers 1:80,   . Pulmonary nodules 05/2008    noted on CXR and CT 05/2008, repeat CT 10/2008 - Stable small bilateral pulmonary nodules measuring up to 6 m m  . Insomnia   . GERD (gastroesophageal reflux disease)   . Fibroids   . History of microcytic hypochromic anemia   . Fibromyalgia   . Myocardial infarction ([facility])   . CAD (coronary artery disease)     NSTEMI 08/2011:  LHC 08/21/11: mLAD 60-70%, pCFX occluded, dRCA chronic occlusion with L-R collats, EF 40-45%, inf AK.  PCI:  BMS to CFX.  Marland Kitchen Depression   . Shortness of breath 08/22/2013    Past Surgical History  Procedure Laterality Date  .  Abdominal hysterectomy  05/2003  . Colonoscopy N/A 07/14/2013    Procedure: COLONOSCOPY;  Surgeon: Beryle Beams, MD;  Location: [facility];  Service: Endoscopy;  Laterality: N/A;  . Stent on chest    . Cardiac catheterization    . Orif tibia plateau Left 08/22/2013    Procedure: OPEN REDUCTION INTERNAL FIXATION (ORIF) TIBIAL PLATEAU;  Surgeon: Renette Butters, MD;  Location: [facility];  Service: Orthopedics;  Laterality: Left;  . Cardiac catheterization N/A 06/02/2016    Procedure: Left Heart Cath and Coronary Angiography;  Surgeon: Belva Crome, MD;  Location: [facility];  Service: Cardiovascular;  Laterality: N/A;    History  Smoking status  . Current Some Day Smoker -- 0.10 packs/day  . Types: Cigarettes  Smokeless tobacco  . Not on file    Comment: 1-2 cigs some days     History  Alcohol Use  . 4.2 oz/week  . 7 Standard drinks or equivalent per week    Comment: beer 1-2 per day    Social History   Social History  . Marital Status: Divorced    Spouse Name: N/A  . Number of Children: N/A  . Years of Education: N/A   Occupational History  . Not on file.   Social History Main Topics  . Smoking status: Current Some Day Smoker -- 0.10 packs/day    Types: Cigarettes  . Smokeless tobacco: Not on file     Comment: 1-2 cigs some days   . Alcohol Use: 4.2 oz/week    7 Standard drinks or equivalent per week     Comment: beer 1-2 per day  . Drug Use: No     Comment: last used cocaine less than a month ago  . Sexual Activity: Not Currently   Other Topics Concern  . Not on file   Social History Narrative    No Known Allergies  Current Facility-Administered Medications  Medication Dose Route Frequency Provider Last Rate Last Dose  . 0.9 %  sodium chloride infusion  250 mL Intravenous PRN Belva Crome, MD      . acetaminophen (TYLENOL) tablet 650 mg  650 mg Oral Q4H PRN Belva Crome, MD      . aspirin chewable tablet 81 mg  81  mg Oral Daily Belva Crome, MD    81 mg at 06/03/16 1000  . aspirin-acetaminophen-caffeine (EXCEDRIN MIGRAINE) per tablet 2 tablet  2 tablet Oral Q6H PRN Lorella Nimrod, MD      . atorvastatin (LIPITOR) tablet 80 mg  80 mg Oral q1800 Jule Ser, DO   80 mg at 06/01/16 1750  . carvedilol (COREG) tablet 12.5 mg  12.5 mg Oral BID WC Jule Ser, DO   12.5 mg at 06/03/16 0716  . enoxaparin (LOVENOX) injection 90 mg  90 mg Subcutaneous Once Kris Mouton, [facility]      . fluticasone (FLONASE) 50 MCG/ACT nasal spray 2 spray  2 spray Each Nare Daily Lorella Nimrod, MD      . furosemide (LASIX) tablet 40 mg  40 mg Oral Daily Jule Ser, DO   40 mg at 06/03/16 1000  . ibuprofen (ADVIL,MOTRIN) tablet 400 mg  400 mg Oral Q6H PRN Lorella Nimrod, MD   400 mg at 06/01/16 1043  . lisinopril (PRINIVIL,ZESTRIL) tablet 2.5 mg  2.5 mg Oral Daily Jule Ser, DO   2.5 mg at 06/03/16 1000  . ondansetron (ZOFRAN) injection 4 mg  4 mg Intravenous Q6H PRN Belva Crome, MD      . pantoprazole (PROTONIX) EC tablet 40 mg  40 mg Oral Daily Jule Ser, DO   40 mg at 06/03/16 1000  . sertraline (ZOLOFT) tablet 50 mg  50 mg Oral Daily Jule Ser, DO   50 mg at 06/03/16 1000  . sodium chloride flush (NS) 0.9 % injection 3 mL  3 mL Intravenous Q12H Jule Ser, DO   3 mL at 06/03/16 1000  . sodium chloride flush (NS) 0.9 % injection 3 mL  3 mL Intravenous Q12H Belva Crome, MD   3 mL at 06/02/16 2215  . sodium chloride flush (NS) 0.9 % injection 3 mL  3 mL Intravenous PRN Belva Crome, MD        Prescriptions prior to admission  Medication Sig Dispense Refill Last Dose  . apixaban (ELIQUIS) 5 MG TABS tablet Take 1 tablet (5 mg total) by mouth 2 (two) times daily. 60 tablet 0 Past Week at Unknown time  . aspirin EC 81 MG tablet Take 81 mg by mouth daily.   05/30/2016 at Unknown time  . atorvastatin (LIPITOR) 80 MG tablet TAKE 1 TABLET(80 MG) BY MOUTH DAILY 90 tablet 3 Past Week at Unknown time  . carvedilol (COREG) 12.5 MG tablet TAKE 1  TABLET(12.5 MG) BY MOUTH TWICE DAILY 180 tablet 3 05/30/2016 at 1900  . furosemide (LASIX) 20 MG tablet Take 2 tablets (40 mg total) by mouth daily. 30 tablet 5 05/30/2016 at Unknown time  . lisinopril (PRINIVIL,ZESTRIL) 2.5 MG tablet Take 1 tablet (2.5 mg total) by mouth daily. 30 tablet 5 05/30/2016 at Unknown time  . sertraline (ZOLOFT) 50 MG tablet Take 1 tablet (50 mg total) by mouth daily. 30 tablet 2 05/30/2016 at Unknown time  . naproxen (NAPROSYN) 500 MG tablet Take 1 tablet (500 mg total) by mouth 2 (two) times daily with a meal. (Patient not taking: Reported on 05/31/2016) 14 tablet 0 Completed Course at Unknown time  . pantoprazole (PROTONIX) 40 MG tablet Take 1 tablet (40 mg total) by mouth daily. (Patient not taking: Reported on 05/31/2016) 30 tablet 5 Not Taking at Unknown time    Family History  Problem Relation Age of Onset  . Heart disease Mother   . Cervical cancer Sister   .  Prostate cancer Brother   . Colon cancer Neg Hx      Review of Systems:      Cardiac Review of Systems: Y or N  Chest Pain [  y  ]  Resting SOB [   ] Exertional SOB  [  ]  Orthopnea [  ]   Pedal Edema [   ]    Palpitations [  ] Syncope  [  ]   Presyncope [   ]  General Review of Systems: [Y] = yes [  ]=no Constitional: recent weight change [  ]; anorexia [  ]; fatigue [  ]; nausea [ y ]; night sweats [  ]; fever [  ]; or chills [  ]                                                               Dental: poor dentition[  ]; Last Dentist visit:   Eye : blurred vision [  ]; diplopia [   ]; vision changes [  ];  Amaurosis fugax[  ]; Resp: cough [  ];  wheezing[  ];  hemoptysis[  ]; shortness of breath[ y ]; paroxysmal nocturnal dyspnea[  ]; dyspnea on exertion[  ]; or orthopnea[  ];  GI:  gallstones[  ], vomiting[y  ];  dysphagia[  ]; melena[  ];  hematochezia [  ]; heartburn[  ];   Hx of  Colonoscopy[  ]; GU: kidney stones [  ]; hematuria[  ];   dysuria [  ];  nocturia[  ];  history of     obstruction [  ];  urinary frequency [  ]             Skin: rash, swelling[  ];, hair loss[  ];  peripheral edema[  ];  or itching[  ]; Musculosketetal: myalgias[  ];  joint swelling[  ];  joint erythema[  ];  joint pain[  ];  back pain[  ];  Heme/Lymph: bruising[  ];  bleeding[  ];  anemia[  ];  Neuro: TIA[  ];  headaches[ y ];  stroke[  ];  vertigo[  ];  seizures[  ];   paresthesias[  ];  difficulty walking[  ];  Psych:depression[  ]; anxiety[  ];  Endocrine: diabetes[ n ];  thyroid dysfunction[  ];  Immunizations: Flu [  ]; Pneumococcal[  ];  Other:  Physical Exam: BP 113/71 mmHg  Pulse 79  Temp(Src) 99.7 F (37.6 C) (Oral)  Resp 24  Ht _0  (1.575 m)  Wt 206 lb 12.8 oz (93.804 kg)  BMI 37.81 kg/m2  SpO2 100%  LMP 12/30/1994  General appearance: alert, cooperative and no distress Head: Normocephalic, without obvious abnormality, atraumatic Neck: no adenopathy, no carotid bruit, no JVD, supple, symmetrical, trachea midline and thyroid not enlarged, symmetric, no tenderness/mass/nodules Resp: clear to auscultation bilaterally Cardio: irregularly irregular rhythm GI: soft, non-tender; bowel sounds normal; no masses,  no organomegaly Extremities: extremities normal, atraumatic, no cyanosis or edema Neurologic: Grossly normal  Diagnostic Studies & Laboratory data:     Recent Radiology Findings:   Nm Myocar Multi W/spect W/wall Motion / Ef  06/01/2016  CLINICAL DATA:  Shortness of breath and chest pain. EXAM: MYOCARDIAL IMAGING WITH SPECT (REST AND PHARMACOLOGIC-STRESS) GATED LEFT  VENTRICULAR WALL MOTION STUDY LEFT VENTRICULAR EJECTION FRACTION TECHNIQUE: Standard myocardial SPECT imaging was performed after resting intravenous injection of 10 mCi Tc-6mtetrofosmin. Subsequently, intravenous infusion of Lexiscan was performed under the supervision of the Cardiology staff. At peak effect of the drug, 30 mCi Tc-935metrofosmin was injected intravenously and standard myocardial SPECT imaging was  performed. Quantitative gated imaging was also performed to evaluate left ventricular wall motion, and estimate left ventricular ejection fraction. COMPARISON:  None. FINDINGS: Perfusion: Decreased activity in the high lateral wall on both sets of images but more pronounced on the stress images. There is also anterior wall reversibility Wall Motion: Wall motion abnormality involving the upper lateral wall. Left Ventricular Ejection Fraction: 49 % End diastolic volume 12161l End systolic volume 61 ml IMPRESSION: 1. Findings suggest high lateral wall scar with peri-infarct ischemia. There is also anterior wall ischemia. 2. Upper lateral wall motion abnormality. 3. Left ventricular ejection fraction 49% 4. Non invasive risk stratification*: High risk *2012 Appropriate Use Criteria for Coronary Revascularization Focused Update: J Am Coll Cardiol. 200960;45(4):[email or web address] Electronically Signed   By: P.Marijo Sanes.D.   On: 06/01/2016 16:34     I have independently reviewed the above radiologic studies.  Recent Lab Findings: Lab Results  Component Value Date   WBC 7.4 06/03/2016   HGB 12.2 06/03/2016   HCT 39.2 06/03/2016   PLT 190 06/03/2016   GLUCOSE 121* 06/02/2016   CHOL 218* 05/26/2016   TRIG 102 05/26/2016   HDL 49 05/26/2016   LDLDIRECT 145.2 10/29/2011   LDLCALC 149* 05/26/2016   ALT 28 05/30/2016   AST 40 05/30/2016   NA 135 06/02/2016   K 3.2* 06/02/2016   CL 105 06/02/2016   CREATININE 0.86 06/02/2016   BUN 8 06/02/2016   CO2 21* 06/02/2016   TSH 1.708 03/06/2016   INR 1.35 06/02/2016   HGBA1C 6.0* 03/06/2016    LV EF: 50% - 55%  ------------------------------------------------------------------- Indications: Atrial fibrillation - 427.31.  ------------------------------------------------------------------- History: PMH: Lupus. Fibromyalgia. Coronary artery disease. PMH: Myocardial infarction. Risk  factors: Hypertension. Dyslipidemia.  ------------------------------------------------------------------- Study Conclusions  - Left ventricle: The cavity size was normal. Wall thickness was  increased in a pattern of mild LVH. Systolic function was low  normal to mildly reduced. The estimated ejection fraction was in  the range of 50% to 55%. Wall motion was normal; there were no  regional wall motion abnormalities. Doppler parameters are  consistent with abnormal left ventricular relaxation (grade 1  diastolic dysfunction). - Aortic valve: There was no stenosis. There was trivial  regurgitation. - Mitral valve: There was trivial regurgitation. - Right ventricle: The cavity size was normal. Systolic function  was normal. - Pulmonary arteries: No complete TR doppler jet so unable to  estimate PA systolic pressure. - Inferior vena cava: The vessel was normal in size. The  respirophasic diameter changes were in the normal range (>= 50%),  consistent with normal central venous pressure.  Impressions:  - Normal LV size with mild LV hypertrophy. Low normal to mildly  decreased systolic function, EF 5014-78%Normal RV size and  systolic function. No significant valvular abnormalities.  Transthoracic echocardiography. M-mode, complete 2D, spectral Doppler, and color Doppler. Birthdate: Patient birthdate: [birth year]Age: Patient is [age]r old. Sex: Gender: female. BMI: 38.3 kg/m^2. Blood pressure: 154/85 Patient status: Inpatient. Study date: Study date: 03/06/2016. Study time: 08:48 AM. Location: Echo laboratory.  -------------------------------------------------------------------  ------------------------------------------------------------------- Left ventricle: The cavity size was normal. Wall thickness was increased in  a pattern of mild LVH. Systolic function was low normal to mildly reduced. The estimated ejection fraction was in the range  of 50% to 55%. Wall motion was normal; there were no regional wall motion abnormalities. Doppler parameters are consistent with abnormal left ventricular relaxation (grade 1 diastolic dysfunction).  ------------------------------------------------------------------- Aortic valve: Trileaflet. Doppler: There was no stenosis. There was trivial regurgitation.  ------------------------------------------------------------------- Aorta: Aortic root: The aortic root was normal in size. Ascending aorta: The ascending aorta was normal in size.  ------------------------------------------------------------------- Mitral valve: Normal thickness leaflets . Doppler: There was no evidence for stenosis. There was trivial regurgitation.  ------------------------------------------------------------------- Left atrium: The atrium was normal in size.  ------------------------------------------------------------------- Right ventricle: The cavity size was normal. Systolic function was normal.  ------------------------------------------------------------------- Pulmonic valve: Structurally normal valve. Cusp separation was normal. Doppler: Transvalvular velocity was within the normal range. There was no regurgitation.  ------------------------------------------------------------------- Tricuspid valve: Doppler: There was no significant regurgitation.  ------------------------------------------------------------------- Pulmonary artery: No complete TR doppler jet so unable to estimate PA systolic pressure.  ------------------------------------------------------------------- Right atrium: The atrium was normal in size.  ------------------------------------------------------------------- Pericardium: There was no pericardial effusion.  ------------------------------------------------------------------- Systemic veins: Inferior vena cava: The vessel was  normal in size. The respirophasic diameter changes were in the normal range (>= 50%), consistent with normal central venous pressure.  ------------------------------------------------------------------- Measurements  Left ventricle Value Reference LV ID, ED, PLAX chordal 45.6 mm 43 - 52 LV ID, ES, PLAX chordal 35.3 mm 23 - 38 LV fx shortening, PLAX chordal (L) 23 % >=29 LV PW thickness, ED 10.4 mm --------- IVS/LV PW ratio, ED (H) 1.41 <=1.3 LV e&', lateral 4.58 cm/s --------- LV E/e&', lateral 12.49 --------- LV e&', medial 3.6 cm/s --------- LV E/e&', medial 15.89 --------- LV e&', average 4.09 cm/s --------- LV E/e&', average 13.99 ---------  Ventricular septum Value Reference IVS thickness, ED 14.7 mm ---------  LVOT Value Reference LVOT ID, S 24 mm --------- LVOT area 4.52 cm^2 ---------  Aorta Value Reference Aortic root ID, ED 36 mm ---------  Left atrium Value Reference LA ID, A-P, ES 34 mm --------- LA ID/bsa, A-P 1.63 cm/m^2 <=2.2 LA volume, S 39.3 ml --------- LA volume/bsa, S 18.8 ml/m^2 --------- LA volume, ES, 1-p A4C 34.7 ml --------- LA volume/bsa, ES, 1-p A4C 16.6 ml/m^2  --------- LA volume, ES, 1-p A2C 39.7 ml --------- LA volume/bsa, ES, 1-p A2C 19 ml/m^2 ---------  Mitral valve Value Reference Mitral E-wave peak velocity 57.2 cm/s --------- Mitral A-wave peak velocity 78.6 cm/s --------- Mitral deceleration time (H) 275 ms 150 - 230 Mitral E/A ratio, peak 0.7 ---------  Right ventricle Value Reference RV E/e&', lateral 85 --------- RV s&', lateral, S 154 cm/s ---------  Legend: (L) and (H) mark values outside specified reference range.  ------------------------------------------------------------------- Prepared and Electronically Authenticated by  Loralie Champagne, M.D. 2017-04-21T13:12:53  CATH:  Procedures    Left Heart Cath and Coronary Angiography    Conclusion    1. Prox Cx to Dist Cx lesion, 40% stenosed. The lesion was previously treated with a stent (unknown type). 2. Dist Cx lesion, 90% stenosed. 3. Ost 2nd Mrg to 2nd Mrg lesion, 85% stenosed. 4. Mid RCA lesion, 100% stenosed. 5. Dist RCA lesion, 100% stenosed. 6. Ost 1st Diag to 1st Diag lesion, 90% stenosed. 7. Ost 2nd Diag to 2nd Diag lesion, 90% stenosed. 8. Prox LAD to Mid LAD lesion, 50% stenosed. 9. Dist LAD lesion, 80% stenosed. 10. 1st Sept lesion, 70% stenosed.   Recent high risk myocardial perfusion study with  evidence of prior lateral wall infarction, and anterior ischemia. Previous imaging has also demonstrated inferior wall scar.  70-80% obstruction (relatively focal) in the mid LAD. High-grade obstruction in each of 2 moderate sized diagonal branches.  High-grade obstruction beyond the previously placed bare-metal stent in the mid circumflex with 85% obtuse marginal and 90% obstruction in the  continuation of circumflex beyond the obtuse marginal.  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  Mid anterior wall hypokinesis, with estimated ejection fraction in the 40-45% range. 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   RECOMMENDATIONS:   Severe three-vessel coronary disease with chronic occlusion of a small to moderate size right coronary collateralized from left to right, relatively focal 80% mid LAD stenosis and high-grade obstruction in the first and second diagonal branches, and high-grade obstruction in the mid to distal circumflex beyond the previously placed bare metal stent. Given risk factors and decreased LV function, coronary artery bypass grafting should be considered. Because of the late completion of the case, the consult was not called and will need to be done in the a.m. by the treating team.  If she is not an appropriate surgical candidate, coronary intervention on the mid LAD could be considered. The diagonals are not treatable. The circumflex beyond the mid vessel stent would also be difficult to treat.      I have independently reviewed the above  cath films and reviewed the findings with the  patient .    Assessment / Plan:      1. CAD- request for CABG... Patient on Eliquis, but states hasn't had in 3 days 2. HTN 3. Hyperlipidemia 4. Polysubstance abuse 5. Dispo- patient currently chest pain free, continue to hold Eliquis needs to be off for 5 days prior to proceeding with surgery  I have discussed CABG with patient, she is at increased risk for surgery with multiple medical issues and poor compliance and continued smoking. Needs aggressive management of CHF symptoms and elevated LVEDP. Consider Cabg Monday 7/24   I  spent 40 minutes counseling the patient face to face and 50% or more the  time was spent in counseling and coordination of care. The total  time spent in the appointment was 60 minutes.  [name] MD      [address] [place] [address] Office [phone]   [place]

## 2016-06-03 NOTE — Progress Notes (Signed)
Internal Medicine Attending  Date: 06/03/2016  Patient name: Elizabeth [name] Medical record number: [id number] Date of birth: May 26, 1956 Age: 60 y.o. Gender: female  I saw and evaluated the patient. I reviewed the resident's note by Dr. Reesa Chew and I agree with the resident's findings and plans as documented in her progress note.  Cardiac cath yesterday showed severe three-vessel disease given her underlying comorbidities and anatomy she's felt to be a good candidate for coronary bypass grafting rather than percutaneous coronary intervention. Cardiovascular surgery has been consulted for a decision on her candidacy and timing for the likely surgery. Once we have this information we can make a decision on her disposition and anticoagulation depending on the timing of the surgery.

## 2016-06-03 NOTE — Progress Notes (Addendum)
ANTICOAGULATION CONSULT NOTE  Pharmacy Consult:  Heparin Indication: chest pain/ACS  No Known Allergies  Patient Measurements: Height: _0  (157.5 cm) Weight: 206 lb 12.8 oz (93.804 kg) IBW/kg (Calculated) : 50.1 Heparin Dosing Weight: 72 kg  Vital Signs: Temp: 99.7 F (37.6 C) (07/19 0504) Temp Source: Oral (07/19 0504) BP: 113/71 mmHg (07/19 1000) Pulse Rate: 79 (07/19 0504)  Labs:  Recent Labs  05/31/16 1742 05/31/16 2329 06/01/16 0514  06/01/16 1905 06/02/16 0456 06/02/16 1328 06/03/16 0827  HGB  --   --   --   --   --  12.8  --  12.2  HCT  --   --   --   --   --  40.0  --  39.2  PLT  --   --   --   --   --  174  --  190  APTT  --   --   --   < > 39* 61* 114* 91*  LABPROT  --   --   --   --   --  16.8*  --   --   INR  --   --   --   --   --  1.35  --   --   HEPARINUNFRC  --   --   --   --  >2.20* 1.50*  --  0.59  CREATININE  --   --   --   --   --  0.86  --   --   TROPONINI <0.03 <0.03 0.03*  --   --   --   --   --   < > = values in this interval not displayed.  Estimated Creatinine Clearance: 76.1 mL/min (by C-G formula based on Cr of 0.86).    Assessment: 31 YOF on Eliquis PTA for history of AFib.  Patient was transitioned to IV heparin for ACS.  Now s/p cath with plans for CABG vs intervention on the mid LAD. Pharmacy consulted to dose heparin.  -heparin level = 0.59 and aPTT = 91  Goal of Therapy:  aPTT 66-102 seconds  HL 0.3-0.7 units/mL Monitor platelets by anticoagulation protocol: Yes    Plan:  -No heparin changes needed -Daily CBC and HL -Will consider d/c aPTTs in am -Will follow procedure plans  Hildred Laser, Pharm D 06/03/2016 10:28 AM   Addendum -Per Internal Medcine team: Change to lovenox -SCr= 0.86, CrCl ~ 75 -hg= 12.2, plt= 190   Plan -Lovenox 63m sq q12h -CBC every 3 days -Will follow procedure plans  AHildred Laser Pharm D 06/03/2016 11:43 AM

## 2016-06-03 NOTE — Progress Notes (Signed)
SUBJECTIVE:  No chest pain.  No distress.  Anxious.   PHYSICAL EXAM Filed Vitals:   06/02/16 2231 06/02/16 2300 06/03/16 0453 06/03/16 0504  BP: 169/85 160/77  162/82  Pulse:    79  Temp:    99.7 F (37.6 C)  TempSrc:    Oral  Resp:    24  Height:      Weight:   206 lb 12.8 oz (93.804 kg)   SpO2: 99%   100%   General:  No distress Lungs:  Clear Heart:  RRR Abdomen:  Positive bowel sounds, no rebound no guarding Extremities:  Right wrist without bruising or bleeding.   LABS: Lab Results  Component Value Date   TROPONINI 0.03* 06/01/2016   Results for orders placed or performed during the hospital encounter of 05/30/16 (from the past 24 hour(s))  APTT     Status: Abnormal   Collection Time: 06/02/16  1:28 PM  Result Value Ref Range   aPTT 114 (H) 24 - 37 seconds  Heparin level (unfractionated)     Status: None   Collection Time: 06/03/16  8:27 AM  Result Value Ref Range   Heparin Unfractionated 0.59 0.30 - 0.70 IU/mL  APTT     Status: Abnormal   Collection Time: 06/03/16  8:27 AM  Result Value Ref Range   aPTT 91 (H) 24 - 37 seconds  CBC     Status: Abnormal   Collection Time: 06/03/16  8:27 AM  Result Value Ref Range   WBC 7.4 4.0 - 10.5 K/uL   RBC 4.58 3.87 - 5.11 MIL/uL   Hemoglobin 12.2 12.0 - 15.0 g/dL   HCT 39.2 36.0 - 46.0 %   MCV 85.6 78.0 - 100.0 fL   MCH 26.6 26.0 - 34.0 pg   MCHC 31.1 30.0 - 36.0 g/dL   RDW 15.6 (H) 11.5 - 15.5 %   Platelets 190 150 - 400 K/uL    Intake/Output Summary (Last 24 hours) at 06/03/16 0945 Last data filed at 06/02/16 1900  Gross per 24 hour  Intake    240 ml  Output      0 ml  Net    240 ml    CATH Conclusion    1. Prox Cx to Dist Cx lesion, 40% stenosed. The lesion was previously treated with a stent (unknown type). 2. Dist Cx lesion, 90% stenosed. 3. Ost 2nd Mrg to 2nd Mrg lesion, 85% stenosed. 4. Mid RCA lesion, 100% stenosed. 5. Dist RCA lesion, 100% stenosed. 6. Ost 1st Diag to 1st Diag lesion, 90%  stenosed. 7. Ost 2nd Diag to 2nd Diag lesion, 90% stenosed. 8. Prox LAD to Mid LAD lesion, 50% stenosed. 9. Dist LAD lesion, 80% stenosed. 10. 1st Sept lesion, 70% stenosed.   Recent high risk myocardial perfusion study with evidence of prior lateral wall infarction, and anterior ischemia. Previous imaging and also demonstrated inferior wall scar.  70-80% obstruction relatively focal in the mid LAD. High-grade obstruction each of 2 moderate size diagonal branches.  High-grade obstruction beyond the previously placed bare-metal stent in the mid circumflex with 85% obtuse marginal and 90% obstruction in the continuation of circumflex beyond the obtuse marginal.  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  Mid anterior wall hypokinesis, with estimated ejection fraction in the 40-45% range. 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     ASSESSMENT AND PLAN:    CHEST PAIN/ABNORMAL STRESS TEST:  Cath yesterday with results as above.  [facility] consult called today.    HTN:    The blood pressure is at target. No change in medications is indicated.   PAF:   Maintaining NSR, Elizabeth [name] has a CHA2DS2 - VASc score of 3.   Eliquis on hold pending [facility] consult and the decision on the timing of surgery.  No bridging necessary.    Elizabeth [name] [facility] 06/03/2016 9:45 AM

## 2016-06-04 ENCOUNTER — Inpatient Hospital Stay (HOSPITAL_COMMUNITY): Payer: Medicare Other

## 2016-06-04 DIAGNOSIS — R079 Chest pain, unspecified: Secondary | ICD-10-CM

## 2016-06-04 DIAGNOSIS — I2510 Atherosclerotic heart disease of native coronary artery without angina pectoris: Secondary | ICD-10-CM

## 2016-06-04 LAB — PULMONARY FUNCTION TEST
DL/VA % pred: 71 %
DL/VA: 3.24 ml/min/mmHg/L
DLCO cor % pred: 46 %
DLCO cor: 9.97 ml/min/mmHg
DLCO unc % pred: 44 %
DLCO unc: 9.58 ml/min/mmHg
FEF 25-75 Post: 0.87 L/sec
FEF 25-75 Pre: 0.75 L/sec
FEF2575-%Change-Post: 16 %
FEF2575-%Pred-Post: 43 %
FEF2575-%Pred-Pre: 37 %
FEV1-%Change-Post: 12 %
FEV1-%Pred-Post: 69 %
FEV1-%Pred-Pre: 62 %
FEV1-Post: 1.36 L
FEV1-Pre: 1.21 L
FEV1FVC-%Change-Post: 15 %
FEV1FVC-%Pred-Pre: 78 %
FEV6-%Change-Post: -3 %
FEV6-%Pred-Post: 78 %
FEV6-%Pred-Pre: 81 %
FEV6-Post: 1.88 L
FEV6-Pre: 1.94 L
FEV6FVC-%Change-Post: 0 %
FEV6FVC-%Pred-Post: 102 %
FEV6FVC-%Pred-Pre: 103 %
FVC-%Change-Post: -2 %
FVC-%Pred-Post: 76 %
FVC-%Pred-Pre: 78 %
FVC-Post: 1.89 L
FVC-Pre: 1.94 L
Post FEV1/FVC ratio: 72 %
Post FEV6/FVC ratio: 100 %
Pre FEV1/FVC ratio: 62 %
Pre FEV6/FVC Ratio: 100 %
RV % pred: 80 %
RV: 1.5 L
TLC % pred: 78 %
TLC: 3.75 L

## 2016-06-04 LAB — VAS US DOPPLER PRE CABG
LEFT ECA DIAS: -15 cm/s
LEFT VERTEBRAL DIAS: -12 cm/s
Left CCA dist dias: -17 cm/s
Left CCA dist sys: -70 cm/s
Left CCA prox dias: 17 cm/s
Left CCA prox sys: 102 cm/s
Left ICA dist dias: -32 cm/s
Left ICA dist sys: -85 cm/s
Left ICA prox dias: -20 cm/s
Left ICA prox sys: -59 cm/s
RIGHT ECA DIAS: -15 cm/s
RIGHT VERTEBRAL DIAS: -14 cm/s
Right CCA prox dias: 17 cm/s
Right CCA prox sys: 66 cm/s
Right cca dist sys: -78 cm/s

## 2016-06-04 MED ORDER — FUROSEMIDE 10 MG/ML IJ SOLN
40.0000 mg | Freq: Two times a day (BID) | INTRAMUSCULAR | Status: AC
  Administered 2016-06-04 – 2016-06-05 (×2): 40 mg via INTRAVENOUS
  Filled 2016-06-04 (×2): qty 4

## 2016-06-04 MED ORDER — ALBUTEROL SULFATE (2.5 MG/3ML) 0.083% IN NEBU
2.5000 mg | INHALATION_SOLUTION | Freq: Once | RESPIRATORY_TRACT | Status: AC
  Administered 2016-06-04: 2.5 mg via RESPIRATORY_TRACT

## 2016-06-04 MED ORDER — HEPARIN SODIUM (PORCINE) 5000 UNIT/ML IJ SOLN
5000.0000 [IU] | Freq: Three times a day (TID) | INTRAMUSCULAR | Status: DC
  Administered 2016-06-05 – 2016-06-07 (×7): 5000 [IU] via SUBCUTANEOUS
  Filled 2016-06-04 (×7): qty 1

## 2016-06-04 MED ORDER — CARVEDILOL 25 MG PO TABS
25.0000 mg | ORAL_TABLET | Freq: Two times a day (BID) | ORAL | Status: DC
  Administered 2016-06-04 – 2016-06-07 (×7): 25 mg via ORAL
  Filled 2016-06-04 (×7): qty 1

## 2016-06-04 NOTE — Progress Notes (Signed)
Called Dr. Reesa Chew concerning order to discontinue telemetry.  Dr would like telemetry to remain discontinued until reassessed tomorrow.  Payton Emerald, RN

## 2016-06-04 NOTE — Progress Notes (Addendum)
Right Lower Extremity Vein Map    Right Great Saphenous Vein   Segment Diameter Comment  1. Origin 5.368m Branch, lateral  Vein mapped  2. High Thigh 5.038m  3. Mid Thigh 3.68m568m 4. Low Thigh 3.5mm11m5. At Knee 3.8mm 668m. High Calf 2.7mm  71m Low Calf 2.2mm   15mAnkle 2.1mm    55m   Left Lower Extremity Vein Map    Left Great Saphenous Vein   Segment Diameter Comment  1. Origin 6.0mm Late65m branch mapped  2. High Thigh 5.6mm   3. 25m Thigh 4.1mm   4. L768mThigh 4.68mm   5. At93mee 4.2mm   6. Hig25malf 3.2mm   7. Low 27mf 2.5mm   8. Ankle59m3mm    mm    mm86m mm

## 2016-06-04 NOTE — Progress Notes (Signed)
Internal Medicine Attending  Date: 06/04/2016  Patient name: Elizabeth [name] Medical record number: [id number] Date of birth: 12/12/1955 Age: 60 y.o. Gender: female  I saw and evaluated the patient. I reviewed the resident's note by Dr. Reesa Chew and I agree with the resident's findings and plans as documented in her progress note.  When seen on rounds this morning [name] was without any shortness of breath, orthopnea, paroxysmal nocturnal dyspnea, or chest pain. Thoracic surgery has seen her and tentatively has her scheduled for a CABG on Monday. Is my impression today would like her to remain in the hospital for optimization of her blood pressure and elevated left ventricular end-diastolic pressure seen on cardiac cath, prior to her surgery. We will provide her with 2 doses of IV Lasix for the asymptomatic elevation of the LVEDP and increase her Coreg to see if we can decrease her afterload. She is not receiving any bridging in anticipation of her CABG on Monday.

## 2016-06-04 NOTE — Progress Notes (Signed)
CARDIAC REHAB PHASE I   Pt c/o upset stomach, declines ambulation at this time. Pt states she plans to ambulate independently later today. Cardiac surgery pre-op education completed. Reviewed IS, sternal precautions, activity progression, cardiac surgery booklet and cardiac surgery guidelines. Left information to view cardiac surgery videos. Pt verbalized understanding. Pt sitting on edge of bed, call bell within reach. Will follow.   [id number] Lenna Sciara, RN, BSN 06/04/2016 1:05 PM

## 2016-06-04 NOTE — Progress Notes (Signed)
    SUBJECTIVE:  No chest pain.  No SOB.  Nauseated slightly.   PHYSICAL EXAM Filed Vitals:   06/03/16 1321 06/03/16 1630 06/03/16 2036 06/04/16 0415  BP: 132/66 158/76 152/78 145/70  Pulse: 61 72 63 66  Temp: 97.7 F (36.5 C)  98.6 F (37 C) 97.9 F (36.6 C)  TempSrc: Oral  Oral Oral  Resp: _0 Height:      Weight:    207 lb 8 oz (94.121 kg)  SpO2: 94%  95% 92%   General:  No distress Lungs:  Clear Heart:  RRR Abdomen:  Positive bowel sounds, no rebound no guarding Extremities:  No edema   LABS:  No results found for this or any previous visit (from the past 24 hour(s)).  Intake/Output Summary (Last 24 hours) at 06/04/16 0830 Last data filed at 06/03/16 2052  Gross per 24 hour  Intake    723 ml  Output      0 ml  Net    723 ml    CATH Conclusion    1. Prox Cx to Dist Cx lesion, 40% stenosed. The lesion was previously treated with a stent (unknown type). 2. Dist Cx lesion, 90% stenosed. 3. Ost 2nd Mrg to 2nd Mrg lesion, 85% stenosed. 4. Mid RCA lesion, 100% stenosed. 5. Dist RCA lesion, 100% stenosed. 6. Ost 1st Diag to 1st Diag lesion, 90% stenosed. 7. Ost 2nd Diag to 2nd Diag lesion, 90% stenosed. 8. Prox LAD to Mid LAD lesion, 50% stenosed. 9. Dist LAD lesion, 80% stenosed. 10. 1st Sept lesion, 70% stenosed.   Recent high risk myocardial perfusion study with evidence of prior lateral wall infarction, and anterior ischemia. Previous imaging and also demonstrated inferior wall scar.  70-80% obstruction relatively focal in the mid LAD. High-grade obstruction each of 2 moderate size diagonal branches.  High-grade obstruction beyond the previously placed bare-metal stent in the mid circumflex with 85% obtuse marginal and 90% obstruction in the continuation of circumflex beyond the obtuse marginal.  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  Mid anterior wall hypokinesis, with estimated ejection fraction in  the 40-45% range. 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     ASSESSMENT AND PLAN:    CHEST PAIN/ABNORMAL STRESS TEST:   Cath yesterday with results as above.  [facility] consult appreciated.  Possible CABG Monday.    HTN:    The blood pressure is at target. No change in medications is indicated.   PAF:   Maintaining NSR, Ms. [place] P Ke has a CHA2DS2 - VASc score of 3.   Eliquis on hold pending [facility] surgery.  No bridging necessary.    ELEVATED EDP:  No symptoms of CHF.  EDP was high at cath.  I/Os have not been complete.  She is on Coreg and Lasix.  Continue current therapy.   Strict intake and output.  Daily weights.    Our service will follow as needed over the weekend.   Jeneen Rinks Keyairra Kolinski 06/04/2016 8:30 AM

## 2016-06-04 NOTE — Progress Notes (Signed)
   Subjective: Not feeling so well, C/O bad hospital food. She said she wants to stay here till her surgery. Objective: Vital signs in last 24 hours: Filed Vitals:   06/03/16 2036 06/04/16 0415 06/04/16 1000 06/04/16 1412  BP: 152/78 145/70 152/87 145/75  Pulse: 63 66  65  Temp: 98.6 F (37 C) 97.9 F (36.6 C)  98 F (36.7 C)  TempSrc: Oral Oral  Oral  Resp: _0 Height:      Weight:  207 lb 8 oz (94.121 kg)    SpO2: 95% 92%  93%   Physical Exam General: Well built lady, sitting comfortably in bed. Chest: Clear, no added sounds. CVS: RRR< No r/m/g. Abdomen: Soft, mildly tender epigastrium , no guarding, no rebound. BS+ve  Extremities:No pedal edema,no cyanosis. PP +ve. Bilaterally.  Assessment/Plan: Severe three vessel CAD:She had her cardiac cath. Yesterday which shows Severe three-vessel coronary disease with chronic occlusion of a small to moderate size right coronary collateralized from left to right, relatively focal 80% mid LAD stenosis and high-grade obstruction in the first and second diagonal branches, and high-grade obstruction in the mid to distal circumflex beyond the previously placed bare metal stent. Given risk factors and decreased LV function, coronary artery bypass grafting should be considered. We talked with the patient today,she understands her results and willing to go for surgery. Her Eliquis was stopped on 06/01/16 AM and she was started on heparin infusion after her stress test .Heparin infusion was stopped on 7/19. Eliquis should remain discontinue at this time due to her scheduled CABG on Monday 7/24.She don't need a bridge at this time.   CHF: Clinically no sign of CHF exacerbation. She has LV pressure of 22 on her cath.We will give her two extra doses of lasix 40 mg IV , one today and the other tomorrow morning. Check her BMP tomorrow as she has a tendency for hypokalemia.  Paroxysmal AFib: HR being in 70 es.Discontinue Eliquis and Lovenox due to her  CABG on Monday.  GERD-related Chest Pain: Pretty much resolved. Continue Protonix.  HTN: BP was high at  152/87-145/75 with HR b/w 72-63. We will increase her Coreg to 25 mg BID.  Continue Lasix, and Lisinopril. Check her HR tomorrow after increased dose of coreg. Anxiety and Depression: Continue home Zoloft   She can go home tomorrow from our stand point.   Dispo: Anticipated discharge in approximately 1 day(s).   LOS: 1 day   Lorella Nimrod, MD 06/04/2016, 2:31 PM Pager: [phone]

## 2016-06-04 NOTE — Care Management Note (Signed)
Case Management Note Marvetta Gibbons RN, BSN Unit 2W-Case Manager [phone]  Patient Details  Name: Elizabeth [name] MRN: [id number] Date of Birth: 08/29/1956  Subjective/Objective:     Pt admitted with chest pain, stress test completed with ischemia s/p cath with MVD- CVTS consulted- and plan for possible CABG on 06/08/16               Action/Plan: PTA pt lived at home- independent-  Has family that can assist at discharge- CM to follow up post op for d/c needs  Expected Discharge Date:                  Expected Discharge Plan:  [place]  In-House Referral:     Discharge planning Services  CM Consult  Post Acute Care Choice:    Choice offered to:     DME Arranged:    DME Agency:     HH Arranged:    [place] Agency:     Status of Service:  In process, will continue to follow  If discussed at Long Length of Stay Meetings, dates discussed:    Additional Comments:  Dawayne Patricia, RN 06/04/2016, 10:21 AM

## 2016-06-04 NOTE — Progress Notes (Signed)
Pre-op Cardiac Surgery  Carotid Findings:  Bilateral carotid arteries demonstrated a mild amount of plaque which was not significant 1-39% category. Vertebral arteries demonstrated antegrade flow  Upper Extremity Right Left  Brachial Pressures 175 176  Radial Waveforms Triphasic Triphasic  Ulnar Waveforms Triphasic Triphasic  Palmar Arch (Allen's Test) WNL WNL   Findings:  WNL    Lower  Extremity Right Left  Dorsalis Pedis 137 163  Anterior Tibial    Posterior Tibial 136 164  Ankle/Brachial Indices 0.78 0.93    Findings:  Right sided ABI is suggestive of moderate arterial insufficiency disease. Left sided ABI is suggestive of mild arterial insufficiency disease.

## 2016-06-05 DIAGNOSIS — E876 Hypokalemia: Secondary | ICD-10-CM

## 2016-06-05 DIAGNOSIS — I25110 Atherosclerotic heart disease of native coronary artery with unstable angina pectoris: Secondary | ICD-10-CM

## 2016-06-05 LAB — BASIC METABOLIC PANEL
Anion gap: 9 (ref 5–15)
BUN: 6 mg/dL (ref 6–20)
CHLORIDE: 101 mmol/L (ref 101–111)
CO2: 25 mmol/L (ref 22–32)
CREATININE: 0.84 mg/dL (ref 0.44–1.00)
Calcium: 8.5 mg/dL — ABNORMAL LOW (ref 8.9–10.3)
GFR calc Af Amer: >60 mL/min (ref >60)
GFR calc non Af Amer: >60 mL/min (ref >60)
GLUCOSE: 115 mg/dL — AB (ref 65–99)
Potassium: 2.9 mmol/L — ABNORMAL LOW (ref 3.5–5.1)
Sodium: 135 mmol/L (ref 135–145)

## 2016-06-05 LAB — CBC
HEMATOCRIT: 38.7 % (ref 36.0–46.0)
HEMOGLOBIN: 12.6 g/dL (ref 12.0–15.0)
MCH: 27.2 pg (ref 26.0–34.0)
MCHC: 32.6 g/dL (ref 30.0–36.0)
MCV: 83.4 fL (ref 78.0–100.0)
Platelets: 202 10*3/uL (ref 150–400)
RBC: 4.64 MIL/uL (ref 3.87–5.11)
RDW: 15.3 % (ref 11.5–15.5)
WBC: 7.4 10*3/uL (ref 4.0–10.5)

## 2016-06-05 MED ORDER — POTASSIUM CHLORIDE CRYS ER 20 MEQ PO TBCR
40.0000 meq | EXTENDED_RELEASE_TABLET | Freq: Two times a day (BID) | ORAL | Status: AC
  Administered 2016-06-05 (×2): 40 meq via ORAL
  Filled 2016-06-05 (×2): qty 2

## 2016-06-05 NOTE — Progress Notes (Signed)
   Subjective: Feeling better. No concerns. Objective: Vital signs in last 24 hours: Filed Vitals:   06/04/16 1412 06/04/16 2154 06/05/16 0504 06/05/16 0820  BP: 145/75 130/81 117/78 125/75  Pulse: 65 61 54 65  Temp: 98 F (36.7 C) 98.2 F (36.8 C) 98.1 F (36.7 C)   TempSrc: Oral Oral Oral   Resp: _0 Height:      Weight:      SpO2: 93% 95% 92%    Physical Exam General: Well built lady, sitting comfortably in bed. Chest: Clear, no added sounds. CVS: RRR< No r/m/g. Abdomen: Soft, non tender , no guarding, no rebound. BS+ve  Extremities:No pedal edema,no cyanosis. PP +ve. Bilaterally.  Labs: BMP Latest Ref Rng 06/05/2016 06/02/2016 05/31/2016  Glucose 65 - 99 mg/dL 115(H) 121(H) 119(H)  BUN 6 - 20 mg/dL _1 Creatinine 0.44 - 1.00 mg/dL 0.84 0.86 0.87  Sodium 135 - 145 mmol/L 135 135 136  Potassium 3.5 - 5.1 mmol/L 2.9(L) 3.2(L) 3.4(L)  Chloride 101 - 111 mmol/L 101 105 99(L)  CO2 22 - 32 mmol/L 25 21(L) 24  Calcium 8.9 - 10.3 mg/dL 8.5(L) 8.8(L) 9.0   CBC Latest Ref Rng 06/05/2016 06/03/2016 06/02/2016  WBC 4.0 - 10.5 K/uL 7.4 7.4 6.0  Hemoglobin 12.0 - 15.0 g/dL 12.6 12.2 12.8  Hematocrit 36.0 - 46.0 % 38.7 39.2 40.0  Platelets 150 - 400 K/uL 202 190 174     Assessment/Plan: Severe three vessel CAD: She is going for CABG on Monday morning. [facility] wants to keep her in hospital due to her being high risk and her social circumstances. She is stable at this point.  Hypokalemia: With K at 2.9. She was mildly hypokalemic before and giving her extra lasix her potassium dropped further. We are replacing her with Kdur 40 meq BID for 2 doses and reassess tomorrow morning.  CHF: Clinically no sign of CHF exacerbation. She has LV pressure of 22 on her cath.We  gave her two extra doses of lasix 40 mg IV.One yesterday and one today morning.  Paroxysmal AFib: HR being in 55-64.Discontinue Eliquis and Lovenox due to her CABG on Monday.No bridge required.  GERD-related Chest  Pain: Pretty much resolved. Continue Protonix.  HTN: BP was with 125/78 and HR b/w 54-64. We  increased her Coreg to 25 mg BID. Continue Lasix, and Lisinopril.   Anxiety and Depression: Continue home Zoloft  Dispo: Anticipated discharge in approximately 4-5 day(s).   LOS: 2 days   Elizabeth Nimrod, MD 06/05/2016, 8:50 AM Pager: [phone]

## 2016-06-05 NOTE — Progress Notes (Signed)
[address]       [place] [address]             [phone]                 3 Days Post-Op Procedure(s) (LRB): Left Heart Cath and Coronary Angiography (N/A)  LOS: 2 days   Subjective: Stable today, no chest pain  Objective: Vital signs in last 24 hours: Patient Vitals for the past 24 hrs:  BP Temp Temp src Pulse Resp SpO2  06/05/16 1332 115/61 mmHg 98.5 F (36.9 C) Oral 62 20 92 %  06/05/16 1019 120/72 mmHg - - - - -  06/05/16 0820 125/75 mmHg - - 65 - -  06/05/16 0504 117/78 mmHg 98.1 F (36.7 C) Oral (!) 54 18 92 %  06/04/16 2154 130/81 mmHg 98.2 F (36.8 C) Oral 61 18 95 %    Filed Weights   06/02/16 0527 06/03/16 0453 06/04/16 0415  Weight: 205 lb 12.8 oz (93.35 kg) 206 lb 12.8 oz (93.804 kg) 207 lb 8 oz (94.121 kg)    Hemodynamic parameters for last 24 hours:    Intake/Output from previous day: 07/20 0701 - 07/21 0700 In: 723 [P.O.:720; I.V.:3] Out: -  Intake/Output this shift: Total I/O In: 360 [P.O.:360] Out: -   Scheduled Meds: . aspirin  81 mg Oral Daily  . atorvastatin  80 mg Oral q1800  . carvedilol  25 mg Oral BID WC  . fluticasone  2 spray Each Nare Daily  . furosemide  40 mg Oral Daily  . heparin subcutaneous  5,000 Units Subcutaneous Q8H  . lisinopril  2.5 mg Oral Daily  . pantoprazole  40 mg Oral Daily  . potassium chloride  40 mEq Oral BID  . sertraline  50 mg Oral Daily  . sodium chloride flush  3 mL Intravenous Q12H  . sodium chloride flush  3 mL Intravenous Q12H   Continuous Infusions:  PRN Meds:.sodium chloride, acetaminophen, aspirin-acetaminophen-caffeine, ibuprofen, ondansetron (ZOFRAN) IV, sodium chloride flush  General appearance: alert and cooperative Neurologic: intact Heart: regular rate and rhythm, S1, S2 normal, no murmur, click, rub or gallop Lungs: clear to auscultation bilaterally Abdomen: soft, non-tender; bowel sounds normal; no masses,  no organomegaly Extremities: extremities normal,  atraumatic, no cyanosis or edema and Homans sign is negative, no sign of DVT    Lab Results: CBC: Recent Labs  06/03/16 0827 06/05/16 0439  WBC 7.4 7.4  HGB 12.2 12.6  HCT 39.2 38.7  PLT 190 202   BMET:  Recent Labs  06/05/16 0439  NA 135  K 2.9*  CL 101  CO2 25  GLUCOSE 115*  BUN 6  CREATININE 0.84  CALCIUM 8.5*    PT/INR: No results for input(s): LABPROT, INR in the last 72 hours.   Radiology Dg Chest 2 View  06/01/2016  CLINICAL DATA:  Lung infiltrate. EXAM: CHEST  2 VIEW COMPARISON:  Radiograph of May 31, 2016. FINDINGS: The heart size and mediastinal contours are within normal limits. Both lungs are clear. No pneumothorax or pleural effusion is noted. The visualized skeletal structures are unremarkable. IMPRESSION: No active cardiopulmonary disease. Electronically Signed   By: Marijo Conception, M.D.   On: 06/01/2016 11:46   Dg Chest 2 View  05/31/2016  CLINICAL DATA:  Chest pain radiating to the right side. Abdominal pain and vomiting for 2 days. EXAM: CHEST  2 VIEW COMPARISON:  03/06/2016 FINDINGS: Normal heart size and pulmonary vascularity.  Fine interstitial pattern to the lungs may represent fibrosis or chronic bronchitic change. Linear atelectasis or fibrosis in the left lung base. Developing vague focal opacity in the right upper lung may indicate developing superimposed pneumonia. No blunting of costophrenic angles. No pneumothorax. Calcification of the aorta. IMPRESSION: Chronic interstitial process to the lungs with suggestion of focal developing airspace disease in the right upper lung which may indicate pneumonia. Electronically Signed   By: Lucienne Capers M.D.   On: 05/31/2016 01:41   Ct Abdomen Pelvis W Contrast  05/31/2016  CLINICAL DATA:  60 year old female Next with chest pain, upper abdominal and epigastric pain, nausea vomiting. EXAM: CT ABDOMEN AND PELVIS WITH CONTRAST TECHNIQUE: Multidetector CT imaging of the abdomen and pelvis was performed using the  standard protocol following bolus administration of intravenous contrast. CONTRAST:  140m ISOVUE-300 IOPAMIDOL (ISOVUE-300) INJECTION 61% COMPARISON:  CT abdomen and pelvis dated 08/08/2013 and chest CT dated 11/24/2011 FINDINGS: There are multiple small pulmonary nodules measuring up to 6 mm in the right middle lobe which is similar to the study 11/24/2011. The visualized lung bases are otherwise clear. No intra-abdominal free air or free fluid. Apparent diffuse fatty infiltration of the liver with mild coarsened appearance of the liver parenchyma. The gallbladder, pancreas, spleen, adrenal glands, kidneys, visualized ureters, and urinary bladder appear unremarkable. Hysterectomy. There is scattered colonic diverticula without active inflammatory changes. There is no evidence of bowel obstruction or active inflammation. Normal appendix. 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 The origins of the celiac axis, SMA, IMA as well as the origins of the renal arteries remain patent. No portal venous gas identified. There is no adenopathy. Midline vertical anterior pelvic wall incisional scar. The abdominal wall soft tissues are otherwise unremarkable. There is osteopenia with degenerative changes of the spine. No acute fracture. IMPRESSION: Heterogeneous liver with apparent fatty infiltration. Correlation with LFTs recommended. Scattered colonic diverticula. No evidence of bowel obstruction or active inflammation. Normal appendix. Electronically Signed   By: AAnner CreteM.D.   On: 05/31/2016 02:16   Nm Myocar Multi W/spect W/wall Motion / Ef  06/01/2016  CLINICAL DATA:  Shortness of breath and chest pain. EXAM: MYOCARDIAL IMAGING WITH SPECT (REST AND PHARMACOLOGIC-STRESS) GATED LEFT VENTRICULAR WALL MOTION STUDY LEFT VENTRICULAR EJECTION FRACTION TECHNIQUE: Standard myocardial SPECT imaging was performed after resting intravenous injection of 10 mCi Tc-965metrofosmin. Subsequently, intravenous  infusion of Lexiscan was performed under the supervision of the Cardiology staff. At peak effect of the drug, 30 mCi Tc-9967mtrofosmin was injected intravenously and standard myocardial SPECT imaging was performed. Quantitative gated imaging was also performed to evaluate left ventricular wall motion, and estimate left ventricular ejection fraction. COMPARISON:  None. FINDINGS: Perfusion: Decreased activity in the high lateral wall on both sets of images but more pronounced on the stress images. There is also anterior wall reversibility Wall Motion: Wall motion abnormality involving the upper lateral wall. Left Ventricular Ejection Fraction: 49 % End diastolic volume [id number] End systolic volume 61 ml IMPRESSION: 1. Findings suggest high lateral wall scar with peri-infarct ischemia. There is also anterior wall ischemia. 2. Upper lateral wall motion abnormality. 3. Left ventricular ejection fraction 49% 4. Non invasive risk stratification*: High risk *2012 Appropriate Use Criteria for Coronary Revascularization Focused Update: J Am Coll Cardiol. 2010786;75(4):[email or web address] Electronically Signed   By: P. Marijo SanesD.   On: 06/01/2016 16:34    Assessment/Plan: S/P Procedure(s) (LRB): Left Heart Cath and Coronary Angiography (N/A)  Plan CABG Monday am. The goals risks  and alternatives of the planned surgical procedure  CABG  have been discussed with the patient in detail. The risks of the procedure including death, infection, stroke, myocardial infarction, bleeding, blood transfusion have all been discussed specifically.  I have quoted Elizabeth [name] a 3 % of perioperative mortality and a complication rate as high as 40 %. The patient's questions have been answered.Elizabeth [name] is willing  to proceed with the planned procedure.   [name] MD 06/05/2016 3:56 PM

## 2016-06-05 NOTE — Progress Notes (Signed)
Internal Medicine Attending  Date: 06/05/2016  Patient name: Elizabeth [name] Medical record number: [id number] Date of birth: May 02, 1956 Age: 60 y.o. Gender: female  I saw and evaluated the patient. I reviewed the resident's note by Dr. Reesa Chew and I agree with the resident's findings and plans as documented in her progress note.  When seen on rounds this morning she was without complaints. She was given 2 extra doses of IV Lasix in order to decrease her left ventricular end-diastolic pressure, we hope as she is clinically asymptomatic and we are unable to prove this was achieved on physical exam alone. Her potassium will be replaced and was related to our IV diuresis. We have also increased her beta blocker in order to decrease her afterload as well as her trans-cardiac muscle wall tension and oxygen requirements prior to her CABG.

## 2016-06-05 NOTE — Care Management Important Message (Signed)
Important Message  Patient Details  Name: Elizabeth [name] MRN: [id number] Date of Birth: May 04, 1956   Medicare Important Message Given:  Yes    [name] 06/05/2016, 10:18 AM

## 2016-06-06 DIAGNOSIS — I10 Essential (primary) hypertension: Secondary | ICD-10-CM

## 2016-06-06 DIAGNOSIS — I480 Paroxysmal atrial fibrillation: Secondary | ICD-10-CM

## 2016-06-06 DIAGNOSIS — I255 Ischemic cardiomyopathy: Secondary | ICD-10-CM

## 2016-06-06 DIAGNOSIS — I25119 Atherosclerotic heart disease of native coronary artery with unspecified angina pectoris: Secondary | ICD-10-CM | POA: Insufficient documentation

## 2016-06-06 LAB — BASIC METABOLIC PANEL
Anion gap: 7 (ref 5–15)
BUN: 6 mg/dL (ref 6–20)
CALCIUM: 8.6 mg/dL — AB (ref 8.9–10.3)
CO2: 25 mmol/L (ref 22–32)
CREATININE: 0.93 mg/dL (ref 0.44–1.00)
Chloride: 103 mmol/L (ref 101–111)
GFR calc non Af Amer: >60 mL/min (ref >60)
Glucose, Bld: 123 mg/dL — ABNORMAL HIGH (ref 65–99)
Potassium: 3.5 mmol/L (ref 3.5–5.1)
SODIUM: 135 mmol/L (ref 135–145)

## 2016-06-06 NOTE — Progress Notes (Signed)
Internal Medicine Attending  Date: 06/06/2016  Patient name: Elizabeth [name] Medical record number: [id number] Date of birth: 1956-08-27 Age: 60 y.o. Gender: female  I saw and evaluated the patient. I reviewed the resident's note by Dr. Reesa Chew and I agree with the resident's findings and plans as documented in her progress note.  She is optimized from a medical standpoint and is awaiting CABG on Monday.

## 2016-06-06 NOTE — Progress Notes (Signed)
Primary cardiologist: Dr. Sanda Klein  Seen for followup: Multivessel CAD  Subjective:    No chest pain or shortness of breath. No palpitations. Stable appetite.  Objective:   Temp:  [98.2 F (36.8 C)-98.5 F (36.9 C)] 98.3 F (36.8 C) (07/22 0439) Pulse Rate:  [57-65] 57 (07/22 0439) Resp:  [18-20] 18 (07/22 0439) BP: (115-125)/(61-75) 123/61 mmHg (07/22 0439) SpO2:  [92 %-100 %] 92 % (07/22 0439) Weight:  [203 lb 4.2 oz (92.2 kg)] 203 lb 4.2 oz (92.2 kg) (07/22 0439) Last BM Date: 06/05/16  Filed Weights   06/03/16 0453 06/04/16 0415 06/06/16 0439  Weight: 206 lb 12.8 oz (93.804 kg) 207 lb 8 oz (94.121 kg) 203 lb 4.2 oz (92.2 kg)    Intake/Output Summary (Last 24 hours) at 06/06/16 7544 Last data filed at 06/05/16 2210  Gross per 24 hour  Intake    960 ml  Output      0 ml  Net    960 ml    Telemetry: Sinus rhythm.  Exam:  General: Overweight woman, no distress  Lungs: No rales.  Cardiac: RRR without gallop.  Extremities: No pitting edema.  Lab Results:  Basic Metabolic Panel:  Recent Labs Lab 06/02/16 0456 06/05/16 0439 06/06/16 0227  NA 135 135 135  K 3.2* 2.9* 3.5  CL 105 101 103  CO2 21* 25 25  GLUCOSE 121* 115* 123*  BUN _0 CREATININE 0.86 0.84 0.93  CALCIUM 8.8* 8.5* 8.6*    Liver Function Tests:  Recent Labs Lab 05/30/16 2247  AST 40  ALT 28  ALKPHOS 141*  BILITOT 0.5  PROT 7.4  ALBUMIN 3.4*    CBC:  Recent Labs Lab 06/02/16 0456 06/03/16 0827 06/05/16 0439  WBC 6.0 7.4 7.4  HGB 12.8 12.2 12.6  HCT 40.0 39.2 38.7  MCV 85.8 85.6 83.4  PLT 174 190 202    Cardiac Enzymes:  Recent Labs Lab 05/31/16 1742 05/31/16 2329 06/01/16 0514  TROPONINI <0.03 <0.03 0.03*    Cardiac catheterization 06/02/2016: 1. Prox Cx to Dist Cx lesion, 40% stenosed. The lesion was previously treated with a stent (unknown type). 2. Dist Cx lesion, 90% stenosed. 3. Ost 2nd Mrg to 2nd Mrg lesion, 85% stenosed. 4. Mid RCA  lesion, 100% stenosed. 5. Dist RCA lesion, 100% stenosed. 6. Ost 1st Diag to 1st Diag lesion, 90% stenosed. 7. Ost 2nd Diag to 2nd Diag lesion, 90% stenosed. 8. Prox LAD to Mid LAD lesion, 50% stenosed. 9. Dist LAD lesion, 80% stenosed. 10. 1st Sept lesion, 70% stenosed.   Recent high risk myocardial perfusion study with evidence of prior lateral wall infarction, and anterior ischemia. Previous imaging has also demonstrated inferior wall scar.  70-80% obstruction (relatively focal) in the mid LAD. High-grade obstruction in each of 2 moderate sized diagonal branches.  High-grade obstruction beyond the previously placed bare-metal stent in the mid circumflex with 85% obtuse marginal and 90% obstruction in the continuation of circumflex beyond the obtuse marginal.  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  Mid anterior wall hypokinesis, with estimated ejection fraction in the 40-45% range. 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  Echocardiogram 03/06/2016: Study Conclusions  - Left ventricle: The cavity size was normal. Wall thickness was  increased in a pattern of mild LVH. Systolic function was low  normal to mildly reduced. The estimated ejection fraction was in  the range of 50% to  55%. Wall motion was normal; there were no  regional wall motion abnormalities. Doppler parameters are  consistent with abnormal left ventricular relaxation (grade 1  diastolic dysfunction). - Aortic valve: There was no stenosis. There was trivial  regurgitation. - Mitral valve: There was trivial regurgitation. - Right ventricle: The cavity size was normal. Systolic function  was normal. - Pulmonary arteries: No complete TR doppler jet so unable to  estimate PA systolic pressure. - Inferior vena cava: The vessel was normal in size. The  respirophasic diameter changes were in the normal range  (>= 50%),  consistent with normal central venous pressure.  Impressions:  - Normal LV size with mild LV hypertrophy. Low normal to mildly  decreased systolic function, EF 69-67%. Normal RV size and  systolic function. No significant valvular abnormalities.  Medications:   Scheduled Medications: . aspirin  81 mg Oral Daily  . atorvastatin  80 mg Oral q1800  . carvedilol  25 mg Oral BID WC  . fluticasone  2 spray Each Nare Daily  . furosemide  40 mg Oral Daily  . heparin subcutaneous  5,000 Units Subcutaneous Q8H  . lisinopril  2.5 mg Oral Daily  . pantoprazole  40 mg Oral Daily  . sertraline  50 mg Oral Daily  . sodium chloride flush  3 mL Intravenous Q12H  . sodium chloride flush  3 mL Intravenous Q12H      PRN Medications:  sodium chloride, acetaminophen, aspirin-acetaminophen-caffeine, ibuprofen, ondansetron (ZOFRAN) IV, sodium chloride flush   Assessment:   1. Multivessel CAD pending CABG for revascularization on Monday.  2. Ischemic cardiomyopathy with LVEF 40-45% at angiography with elevated LVEDP. Clinically stable at this time. She continues on Coreg, lisinopril, and Lasix at this time.  3. Paroxysmal atrial fibrillation, maintaining sinus rhythm. CHADSVASC score is 4. Eliquis is held pending CABG.  4. Essential hypertension, blood pressure control is adequate.   Plan/Discussion:    No changes made to current regimen including aspirin, Coreg, Lasix, lisinopril, and Lipitor. Watch potassium as she may need KCl supplement with Lasix. CABG anticipated for Monday.  Satira Sark, M.D., F.A.C.C.

## 2016-06-06 NOTE — Progress Notes (Addendum)
   Subjective: Feeling better. No concerns. Objective: Vital signs in last 24 hours: Filed Vitals:   06/05/16 1332 06/05/16 1726 06/05/16 2055 06/06/16 0439  BP: 115/61 117/62 124/68 123/61  Pulse: 62 63 60 57  Temp: 98.5 F (36.9 C)  98.2 F (36.8 C) 98.3 F (36.8 C)  TempSrc: Oral  Oral Oral  Resp: _0 Height:      Weight:    203 lb 4.2 oz (92.2 kg)  SpO2: 92%  100% 92%   Physical Exam General: Well built lady, sitting comfortably in bed. Chest: Clear, no added sounds. CVS: RRR< No r/m/g. Abdomen: Soft, non tender , no guarding, no rebound. BS+ve  Extremities:No pedal edema,no cyanosis. PP +ve. Bilaterally.  Labs:  BMP Latest Ref Rng 06/06/2016 06/05/2016 06/02/2016  Glucose 65 - 99 mg/dL 123(H) 115(H) 121(H)  BUN 6 - 20 mg/dL _1 Creatinine 0.44 - 1.00 mg/dL 0.93 0.84 0.86  Sodium 135 - 145 mmol/L 135 135 135  Potassium 3.5 - 5.1 mmol/L 3.5 2.9(L) 3.2(L)  Chloride 101 - 111 mmol/L 103 101 105  CO2 22 - 32 mmol/L 25 25 21(L)  Calcium 8.9 - 10.3 mg/dL 8.6(L) 8.5(L) 8.8(L)    Assessment/Plan:  Severe three vessel CAD: She is going for CABG on Monday morning. [facility] wants [facility] keep [facility] hospital due to her being high risk and her social circumstances. She is stable at this point.  CHF: Clinically no sign of CHF exacerbation. She has LV pressure of 22 on her cath.We gave her two extra doses of lasix 40 mg IV.  Paroxysmal AFib: HR being in 55-64.Discontinue Eliquis and Lovenox due to her CABG on Monday.No bridge required.  Hypokalemia: Resolved today.  GERD-related Chest Pain: Pretty much resolved. Continue Protonix.  HTN: BP was with 123/61 and HR b/w 54-64. We increased her Coreg to 25 mg BID. Continue Lasix, and Lisinopril.   Anxiety and Depression: Continue home Zoloft   Dispo: Anticipated discharge in approximately 3-4 day(s).    LOS: 3 days   Lorella Nimrod, MD 06/06/2016, 2:26 PM Pager: [phone]

## 2016-06-07 LAB — CBC
HCT: 40.1 % (ref 36.0–46.0)
Hemoglobin: 13.4 g/dL (ref 12.0–15.0)
MCH: 27.7 pg (ref 26.0–34.0)
MCHC: 33.4 g/dL (ref 30.0–36.0)
MCV: 82.9 fL (ref 78.0–100.0)
Platelets: 279 10*3/uL (ref 150–400)
RBC: 4.84 MIL/uL (ref 3.87–5.11)
RDW: 15.1 % (ref 11.5–15.5)
WBC: 9 10*3/uL (ref 4.0–10.5)

## 2016-06-07 LAB — URINALYSIS, ROUTINE W REFLEX MICROSCOPIC
Glucose, UA: NEGATIVE mg/dL
Ketones, ur: NEGATIVE mg/dL
Leukocytes, UA: NEGATIVE
Nitrite: NEGATIVE
Protein, ur: 30 mg/dL — AB
Specific Gravity, Urine: 1.008 (ref 1.005–1.030)
pH: 6 (ref 5.0–8.0)

## 2016-06-07 LAB — BASIC METABOLIC PANEL
ANION GAP: 9 (ref 5–15)
BUN: 9 mg/dL (ref 6–20)
CALCIUM: 9.1 mg/dL (ref 8.9–10.3)
CHLORIDE: 103 mmol/L (ref 101–111)
CO2: 22 mmol/L (ref 22–32)
Creatinine, Ser: 0.84 mg/dL (ref 0.44–1.00)
GFR calc non Af Amer: >60 mL/min (ref >60)
Glucose, Bld: 107 mg/dL — ABNORMAL HIGH (ref 65–99)
Potassium: 3.4 mmol/L — ABNORMAL LOW (ref 3.5–5.1)
SODIUM: 134 mmol/L — AB (ref 135–145)

## 2016-06-07 LAB — HEPATIC FUNCTION PANEL
ALK PHOS: 880 U/L — AB (ref 38–126)
ALT: 175 U/L — ABNORMAL HIGH (ref 14–54)
AST: 238 U/L — ABNORMAL HIGH (ref 15–41)
Albumin: 2.9 g/dL — ABNORMAL LOW (ref 3.5–5.0)
BILIRUBIN DIRECT: 2.2 mg/dL — AB (ref 0.1–0.5)
BILIRUBIN INDIRECT: 1.5 mg/dL — AB (ref 0.3–0.9)
TOTAL PROTEIN: 7.7 g/dL (ref 6.5–8.1)
Total Bilirubin: 3.7 mg/dL — ABNORMAL HIGH (ref 0.3–1.2)

## 2016-06-07 LAB — URINE MICROSCOPIC-ADD ON

## 2016-06-07 LAB — ABO/RH: ABO/RH(D): O POS

## 2016-06-07 MED ORDER — SODIUM CHLORIDE 0.9 % IV SOLN
INTRAVENOUS | Status: DC
  Filled 2016-06-07: qty 40

## 2016-06-07 MED ORDER — DOPAMINE-DEXTROSE 3.2-5 MG/ML-% IV SOLN
0.0000 ug/kg/min | INTRAVENOUS | Status: DC
  Filled 2016-06-07: qty 250

## 2016-06-07 MED ORDER — BISACODYL 5 MG PO TBEC
5.0000 mg | DELAYED_RELEASE_TABLET | Freq: Once | ORAL | Status: DC
  Filled 2016-06-07: qty 1

## 2016-06-07 MED ORDER — TEMAZEPAM 15 MG PO CAPS: 15.0000 mg | ORAL_CAPSULE | Freq: Once | ORAL | Status: DC | PRN

## 2016-06-07 MED ORDER — PLASMA-LYTE 148 IV SOLN
INTRAVENOUS | Status: AC
  Administered 2016-06-08: 500 mL
  Filled 2016-06-07: qty 2.5

## 2016-06-07 MED ORDER — NITROGLYCERIN IN D5W 200-5 MCG/ML-% IV SOLN
2.0000 ug/min | INTRAVENOUS | Status: AC
  Administered 2016-06-08: 5 ug/min via INTRAVENOUS
  Administered 2016-06-08: 25 ug/min via INTRAVENOUS
  Filled 2016-06-07: qty 250

## 2016-06-07 MED ORDER — POTASSIUM CHLORIDE CRYS ER 20 MEQ PO TBCR
40.0000 meq | EXTENDED_RELEASE_TABLET | Freq: Once | ORAL | Status: AC
  Administered 2016-06-07: 40 meq via ORAL
  Filled 2016-06-07: qty 2

## 2016-06-07 MED ORDER — CHLORHEXIDINE GLUCONATE 4 % EX LIQD
60.0000 mL | Freq: Once | CUTANEOUS | Status: AC
  Administered 2016-06-08: 4 via TOPICAL
  Filled 2016-06-07: qty 15

## 2016-06-07 MED ORDER — DEXMEDETOMIDINE HCL IN NACL 400 MCG/100ML IV SOLN
0.1000 ug/kg/h | INTRAVENOUS | Status: DC
  Filled 2016-06-07: qty 100

## 2016-06-07 MED ORDER — SODIUM CHLORIDE 0.9 % IV SOLN
INTRAVENOUS | Status: DC
  Filled 2016-06-07: qty 30

## 2016-06-07 MED ORDER — CHLORHEXIDINE GLUCONATE 0.12 % MT SOLN
15.0000 mL | Freq: Once | OROMUCOSAL | Status: AC
  Administered 2016-06-07: 15 mL via OROMUCOSAL
  Filled 2016-06-07: qty 15

## 2016-06-07 MED ORDER — SODIUM CHLORIDE 0.9 % IV SOLN
1500.0000 mg | INTRAVENOUS | Status: DC
  Filled 2016-06-07: qty 1500

## 2016-06-07 MED ORDER — CHLORHEXIDINE GLUCONATE 4 % EX LIQD
60.0000 mL | Freq: Once | CUTANEOUS | Status: AC
  Administered 2016-06-07: 4 via TOPICAL
  Filled 2016-06-07: qty 15

## 2016-06-07 MED ORDER — PHENYLEPHRINE HCL 10 MG/ML IJ SOLN
30.0000 ug/min | INTRAVENOUS | Status: DC
  Filled 2016-06-07: qty 2

## 2016-06-07 MED ORDER — MAGNESIUM SULFATE 50 % IJ SOLN
40.0000 meq | INTRAMUSCULAR | Status: DC
  Filled 2016-06-07: qty 10

## 2016-06-07 MED ORDER — METOPROLOL TARTRATE 12.5 MG HALF TABLET
12.5000 mg | ORAL_TABLET | Freq: Once | ORAL | Status: AC
  Administered 2016-06-07: 12.5 mg via ORAL
  Filled 2016-06-07: qty 1

## 2016-06-07 MED ORDER — POTASSIUM CHLORIDE 2 MEQ/ML IV SOLN
80.0000 meq | INTRAVENOUS | Status: DC
  Filled 2016-06-07: qty 40

## 2016-06-07 MED ORDER — HEPARIN SODIUM (PORCINE) 5000 UNIT/ML IJ SOLN
5000.0000 [IU] | Freq: Three times a day (TID) | INTRAMUSCULAR | Status: AC
  Administered 2016-06-07 (×2): 5000 [IU] via SUBCUTANEOUS
  Filled 2016-06-07: qty 1

## 2016-06-07 MED ORDER — DEXTROSE 5 % IV SOLN
750.0000 mg | INTRAVENOUS | Status: DC
  Filled 2016-06-07: qty 750

## 2016-06-07 MED ORDER — DEXTROSE 5 % IV SOLN
1.5000 g | INTRAVENOUS | Status: AC
  Administered 2016-06-08: .75 g via INTRAVENOUS
  Administered 2016-06-08: 1.5 g via INTRAVENOUS
  Filled 2016-06-07: qty 1.5

## 2016-06-07 MED ORDER — DEXTROSE 5 % IV SOLN
0.0000 ug/min | INTRAVENOUS | Status: DC
  Filled 2016-06-07: qty 4

## 2016-06-07 MED ORDER — SODIUM CHLORIDE 0.9 % IV SOLN
INTRAVENOUS | Status: DC
  Filled 2016-06-07: qty 2.5

## 2016-06-07 NOTE — Progress Notes (Signed)
Primary cardiologist: Dr. Sanda Klein  Seen for followup: Multivessel CAD  Subjective:    No recurrent chest pain. Did not sleep well last night.  Objective:   Temp:  [97.9 F (36.6 C)-98.6 F (37 C)] 98.6 F (37 C) (07/23 0400) Pulse Rate:  [57-65] 65 (07/23 0400) Resp:  [17-19] 18 (07/23 0400) BP: (128-141)/(69-79) 128/69 (07/23 0400) SpO2:  [92 %-99 %] 94 % (07/23 0400) Weight:  [202 lb 6.4 oz (91.8 kg)] 202 lb 6.4 oz (91.8 kg) (07/23 0400) Last BM Date: 06/06/16  Filed Weights   06/04/16 0415 06/06/16 0439 06/07/16 0400  Weight: 207 lb 8 oz (94.1 kg) 203 lb 4.2 oz (92.2 kg) 202 lb 6.4 oz (91.8 kg)   No intake or output data in the 24 hours ending 06/07/16 0740  Telemetry: Sinus rhythm. Lead motion artifact noted (no VT).  Exam:  General: Overweight woman, no distress  Lungs: No rales.  Cardiac: RRR without gallop.  Extremities: No pitting edema.  Lab Results:  Basic Metabolic Panel:  Recent Labs Lab 06/02/16 0456 06/05/16 0439 06/06/16 0227  NA 135 135 135  K 3.2* 2.9* 3.5  CL 105 101 103  CO2 21* 25 25  GLUCOSE 121* 115* 123*  BUN _0 CREATININE 0.86 0.84 0.93  CALCIUM 8.8* 8.5* 8.6*    CBC:  Recent Labs Lab 06/02/16 0456 06/03/16 0827 06/05/16 0439  WBC 6.0 7.4 7.4  HGB 12.8 12.2 12.6  HCT 40.0 39.2 38.7  MCV 85.8 85.6 83.4  PLT 174 190 202    Cardiac Enzymes:  Recent Labs Lab 05/31/16 1742 05/31/16 2329 06/01/16 0514  TROPONINI <0.03 <0.03 0.03*    Cardiac catheterization 06/02/2016: 1. Prox Cx to Dist Cx lesion, 40% stenosed. The lesion was previously treated with a stent (unknown type). 2. Dist Cx lesion, 90% stenosed. 3. Ost 2nd Mrg to 2nd Mrg lesion, 85% stenosed. 4. Mid RCA lesion, 100% stenosed. 5. Dist RCA lesion, 100% stenosed. 6. Ost 1st Diag to 1st Diag lesion, 90% stenosed. 7. Ost 2nd Diag to 2nd Diag lesion, 90% stenosed. 8. Prox LAD to Mid LAD lesion, 50% stenosed. 9. Dist LAD lesion, 80%  stenosed. 10. 1st Sept lesion, 70% stenosed.   Recent high risk myocardial perfusion study with evidence of prior lateral wall infarction, and anterior ischemia. Previous imaging has also demonstrated inferior wall scar.  70-80% obstruction (relatively focal) in the mid LAD. High-grade obstruction in each of 2 moderate sized diagonal branches.  High-grade obstruction beyond the previously placed bare-metal stent in the mid circumflex with 85% obtuse marginal and 90% obstruction in the continuation of circumflex beyond the obtuse marginal.  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  Mid anterior wall hypokinesis, with estimated ejection fraction in the 40-45% range. 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  Echocardiogram 03/06/2016: Study Conclusions  - Left ventricle: The cavity size was normal. Wall thickness was  increased in a pattern of mild LVH. Systolic function was low  normal to mildly reduced. The estimated ejection fraction was in  the range of 50% to 55%. Wall motion was normal; there were no  regional wall motion abnormalities. Doppler parameters are  consistent with abnormal left ventricular relaxation (grade 1  diastolic dysfunction). - Aortic valve: There was no stenosis. There was trivial  regurgitation. - Mitral valve: There was trivial regurgitation. - Right ventricle: The cavity size was normal. Systolic function  was  normal. - Pulmonary arteries: No complete TR doppler jet so unable to  estimate PA systolic pressure. - Inferior vena cava: The vessel was normal in size. The  respirophasic diameter changes were in the normal range (>= 50%),  consistent with normal central venous pressure.  Impressions:  - Normal LV size with mild LV hypertrophy. Low normal to mildly  decreased systolic function, EF 65-65%. Normal RV size and  systolic function. No  significant valvular abnormalities.  Medications:   Scheduled Medications: . aspirin  81 mg Oral Daily  . atorvastatin  80 mg Oral q1800  . carvedilol  25 mg Oral BID WC  . fluticasone  2 spray Each Nare Daily  . furosemide  40 mg Oral Daily  . heparin subcutaneous  5,000 Units Subcutaneous Q8H  . lisinopril  2.5 mg Oral Daily  . pantoprazole  40 mg Oral Daily  . sertraline  50 mg Oral Daily  . sodium chloride flush  3 mL Intravenous Q12H  . sodium chloride flush  3 mL Intravenous Q12H     PRN Medications: sodium chloride, acetaminophen, aspirin-acetaminophen-caffeine, ibuprofen, ondansetron (ZOFRAN) IV, sodium chloride flush   Assessment:   1. Multivessel CAD pending CABG for revascularization on Monday.  2. Ischemic cardiomyopathy with LVEF 40-45% at angiography with elevated LVEDP. Clinically stable at this time. She continues on Coreg, lisinopril, and Lasix at this time.  3. Paroxysmal atrial fibrillation, maintaining sinus rhythm. CHADSVASC score is 4. Eliquis is held pending CABG.  4. Essential hypertension, blood pressure control is adequate.   Plan/Discussion:    No changes made to current regimen including aspirin, Coreg, Lasix, lisinopril, and Lipitor. CABG anticipated for Monday.  Satira Sark, M.D., F.A.C.C.

## 2016-06-07 NOTE — Progress Notes (Signed)
   Subjective: No acute events overnight. She reports a headache this morning that improved with Tylenol. She denies any chest pain or SOB.   Objective: Vital signs in last 24 hours: Vitals:   06/06/16 0439 06/06/16 1707 06/06/16 2018 06/07/16 0400  BP: 123/61 131/69 (!) 141/79 128/69  Pulse: (!) 57 (!) 57 61 65  Resp: _0 Temp: 98.3 F (36.8 C) 97.9 F (36.6 C) 98.1 F (36.7 C) 98.6 F (37 C)  TempSrc: Oral Oral Oral Oral  SpO2: 92% 92% 99% 94%  Weight: 203 lb 4.2 oz (92.2 kg)   202 lb 6.4 oz (91.8 kg)  Height:       Physical Exam General: obese woman sitting up in bed, NAD CV: RRR, no m/g/r Pulm: CTA bilaterally, breaths non-labored Ext: no edema Neuro: alert and oriented x 3   Labs:  BMP Latest Ref Rng & Units 06/06/2016 06/05/2016 06/02/2016  Glucose 65 - 99 mg/dL 123(H) 115(H) 121(H)  BUN 6 - 20 mg/dL _1 Creatinine 0.44 - 1.00 mg/dL 0.93 0.84 0.86  Sodium 135 - 145 mmol/L 135 135 135  Potassium 3.5 - 5.1 mmol/L 3.5 2.9(L) 3.2(L)  Chloride 101 - 111 mmol/L 103 101 105  CO2 22 - 32 mmol/L 25 25 21(L)  Calcium 8.9 - 10.3 mg/dL 8.6(L) 8.5(L) 8.8(L)    Assessment/Plan:  Severe three vessel CAD: She is going for CABG tomorrow morning. She is stable. Will be NPO at midnight.   CHF: Stable. No clinical signs of volume overload. On Lasix 40 mg daily.  Paroxysmal AFib: HR stable in 57-65 range. She is only on DVT ppx as she is having a CABG tomorrow morning. Can resume her Eliquis after surgery.   Hypokalemia: K 3.4 today. Repleted with K-Dur 40 mEq. bmet in AM.   GERD-related Chest Pain: Resolved. Continue Protonix.  HTN: BP stable at 128/69 this morning. Continue Coreg, Lasix, and Lisinopril.  Anxiety and Depression: Continue home Zoloft    Diet: Heart healthy, NPO at midnight DVT PPx: Heparin SQ. Will hold in AM.  Dispo: Anticipated discharge in approximately 2-3 day(s).    LOS: 4 days   Juliet Rude, MD 06/07/2016, 7:25 AM Pager:  [phone]

## 2016-06-07 NOTE — Anesthesia Preprocedure Evaluation (Signed)
Anesthesia Evaluation  Patient identified by MRN, date of birth, ID band Patient awake    Reviewed: Allergy & Precautions, NPO status , Patient's Chart, lab work & pertinent test results  History of Anesthesia Complications Negative for: history of anesthetic complications  Airway Mallampati: III  TM Distance: >3 FB Neck ROM: Full    Dental  (+) Dental Advisory Given   Pulmonary shortness of breath, COPD, Current Smoker,    breath sounds clear to auscultation       Cardiovascular hypertension, Pt. on medications and Pt. on home beta blockers + angina + CAD and + Past MI   Rhythm:Regular Rate:Normal  Cath: 3 vessel ASCADz, EF 40-45%, anterior hypokinesis   Neuro/Psych  Headaches, TIA   GI/Hepatic GERD  Medicated and Poorly Controlled,(+)     substance abuse  cocaine use, Elevated LFTs   Endo/Other  Morbid obesityLupus  Renal/GU negative Renal ROS     Musculoskeletal  (+) Arthritis , Fibromyalgia -  Abdominal (+) + obese,   Peds  Hematology negative hematology ROS (+)   Anesthesia Other Findings   Reproductive/Obstetrics                            Anesthesia Physical Anesthesia Plan  ASA: III  Anesthesia Plan: General   Post-op Pain Management:    Induction: Intravenous  Airway Management Planned: Oral ETT  Additional Equipment: Arterial line, CVP, PA Cath, TEE and Ultrasound Guidance Line Placement  Intra-op Plan:   Post-operative Plan: Post-operative intubation/ventilation  Informed Consent: I have reviewed the patients History and Physical, chart, labs and discussed the procedure including the risks, benefits and alternatives for the proposed anesthesia with the patient or authorized representative who has indicated his/her understanding and acceptance.   Dental advisory given  Plan Discussed with: CRNA and Surgeon  Anesthesia Plan Comments: (Plan routine monitors, A  line, PA cath, GETA with TEE and post op ventilation)        Anesthesia Quick Evaluation

## 2016-06-07 NOTE — Progress Notes (Signed)
Unable to collect ABG sample.  Patient disagreeable and uncooperative.  RT Kerin Salen attempted x2 and I attempted x1. The patient refused redirection of the needle and any further attempts to draw ABG.  Attempts were made to appease the patient and to reassure her.  RN aware.

## 2016-06-08 ENCOUNTER — Other Ambulatory Visit (HOSPITAL_COMMUNITY): Payer: Self-pay

## 2016-06-08 ENCOUNTER — Encounter (HOSPITAL_COMMUNITY)
Admission: EM | Disposition: A | Discharge status: Home / Self Care | Payer: Self-pay | Source: Home / Self Care | Attending: Cardiothoracic Surgery

## 2016-06-08 ENCOUNTER — Inpatient Hospital Stay (HOSPITAL_COMMUNITY): Payer: Medicare Other | Admitting: Anesthesiology

## 2016-06-08 ENCOUNTER — Inpatient Hospital Stay (HOSPITAL_COMMUNITY): Payer: Medicare Other

## 2016-06-08 ENCOUNTER — Encounter (HOSPITAL_COMMUNITY): Payer: Self-pay | Admitting: Anesthesiology

## 2016-06-08 DIAGNOSIS — I2510 Atherosclerotic heart disease of native coronary artery without angina pectoris: Secondary | ICD-10-CM | POA: Diagnosis present

## 2016-06-08 DIAGNOSIS — I25110 Atherosclerotic heart disease of native coronary artery with unstable angina pectoris: Secondary | ICD-10-CM

## 2016-06-08 HISTORY — PX: CORONARY ARTERY BYPASS GRAFT: SHX141

## 2016-06-08 HISTORY — PX: TEE WITHOUT CARDIOVERSION: SHX5443

## 2016-06-08 LAB — CBC
HCT: 27.6 % — ABNORMAL LOW (ref 36.0–46.0)
HEMATOCRIT: 38.7 % (ref 36.0–46.0)
HEMATOCRIT: 31 % — AB (ref 36.0–46.0)
HEMOGLOBIN: 12.4 g/dL (ref 12.0–15.0)
HEMOGLOBIN: 9.8 g/dL — AB (ref 12.0–15.0)
Hemoglobin: 8.7 g/dL — ABNORMAL LOW (ref 12.0–15.0)
MCH: 27 pg (ref 26.0–34.0)
MCH: 26.6 pg (ref 26.0–34.0)
MCH: 26.4 pg (ref 26.0–34.0)
MCHC: 32 g/dL (ref 30.0–36.0)
MCHC: 31.6 g/dL (ref 30.0–36.0)
MCHC: 31.5 g/dL (ref 30.0–36.0)
MCV: 84.3 fL (ref 78.0–100.0)
MCV: 84 fL (ref 78.0–100.0)
MCV: 83.9 fL (ref 78.0–100.0)
Platelets: 283 10*3/uL (ref 150–400)
Platelets: 127 10*3/uL — ABNORMAL LOW (ref 150–400)
Platelets: 144 10*3/uL — ABNORMAL LOW (ref 150–400)
RBC: 4.59 MIL/uL (ref 3.87–5.11)
RBC: 3.69 MIL/uL — AB (ref 3.87–5.11)
RBC: 3.29 MIL/uL — ABNORMAL LOW (ref 3.87–5.11)
RDW: 14.8 % (ref 11.5–15.5)
RDW: 14.5 % (ref 11.5–15.5)
RDW: 14.5 % (ref 11.5–15.5)
WBC: 9.4 10*3/uL (ref 4.0–10.5)
WBC: 14 10*3/uL — ABNORMAL HIGH (ref 4.0–10.5)
WBC: 10.8 10*3/uL — ABNORMAL HIGH (ref 4.0–10.5)

## 2016-06-08 LAB — POCT I-STAT, CHEM 8
BUN: 8 mg/dL (ref 6–20)
BUN: 6 mg/dL (ref 6–20)
BUN: 7 mg/dL (ref 6–20)
BUN: 6 mg/dL (ref 6–20)
BUN: 5 mg/dL — AB (ref 6–20)
BUN: 5 mg/dL — AB (ref 6–20)
CALCIUM ION: 1.01 mmol/L — AB (ref 1.13–1.30)
CHLORIDE: 102 mmol/L (ref 101–111)
CHLORIDE: 102 mmol/L (ref 101–111)
CHLORIDE: 97 mmol/L — AB (ref 101–111)
CHLORIDE: 99 mmol/L — AB (ref 101–111)
CHLORIDE: 99 mmol/L — AB (ref 101–111)
CHLORIDE: 107 mmol/L (ref 101–111)
CREATININE: 0.5 mg/dL (ref 0.44–1.00)
CREATININE: 0.6 mg/dL (ref 0.44–1.00)
CREATININE: 0.6 mg/dL (ref 0.44–1.00)
CREATININE: 0.7 mg/dL (ref 0.44–1.00)
Calcium, Ion: 1.18 mmol/L (ref 1.13–1.30)
Calcium, Ion: 0.97 mmol/L — ABNORMAL LOW (ref 1.13–1.30)
Calcium, Ion: 1.16 mmol/L (ref 1.13–1.30)
Calcium, Ion: 0.97 mmol/L — ABNORMAL LOW (ref 1.13–1.30)
Calcium, Ion: 1.12 mmol/L — ABNORMAL LOW (ref 1.13–1.30)
Creatinine, Ser: 0.7 mg/dL (ref 0.44–1.00)
Creatinine, Ser: 0.5 mg/dL (ref 0.44–1.00)
GLUCOSE: 106 mg/dL — AB (ref 65–99)
GLUCOSE: 86 mg/dL (ref 65–99)
Glucose, Bld: 115 mg/dL — ABNORMAL HIGH (ref 65–99)
Glucose, Bld: 137 mg/dL — ABNORMAL HIGH (ref 65–99)
Glucose, Bld: 108 mg/dL — ABNORMAL HIGH (ref 65–99)
Glucose, Bld: 148 mg/dL — ABNORMAL HIGH (ref 65–99)
HCT: 28 % — ABNORMAL LOW (ref 36.0–46.0)
HCT: 29 % — ABNORMAL LOW (ref 36.0–46.0)
HEMATOCRIT: 40 % (ref 36.0–46.0)
HEMATOCRIT: 27 % — AB (ref 36.0–46.0)
HEMATOCRIT: 35 % — AB (ref 36.0–46.0)
HEMATOCRIT: 26 % — AB (ref 36.0–46.0)
HEMOGLOBIN: 9.9 g/dL — AB (ref 12.0–15.0)
Hemoglobin: 13.6 g/dL (ref 12.0–15.0)
Hemoglobin: 11.9 g/dL — ABNORMAL LOW (ref 12.0–15.0)
Hemoglobin: 9.2 g/dL — ABNORMAL LOW (ref 12.0–15.0)
Hemoglobin: 9.5 g/dL — ABNORMAL LOW (ref 12.0–15.0)
Hemoglobin: 8.8 g/dL — ABNORMAL LOW (ref 12.0–15.0)
POTASSIUM: 3.6 mmol/L (ref 3.5–5.1)
POTASSIUM: 3.2 mmol/L — AB (ref 3.5–5.1)
POTASSIUM: 3.5 mmol/L (ref 3.5–5.1)
POTASSIUM: 4.7 mmol/L (ref 3.5–5.1)
POTASSIUM: 3.8 mmol/L (ref 3.5–5.1)
POTASSIUM: 4.5 mmol/L (ref 3.5–5.1)
SODIUM: 137 mmol/L (ref 135–145)
SODIUM: 136 mmol/L (ref 135–145)
SODIUM: 136 mmol/L (ref 135–145)
SODIUM: 139 mmol/L (ref 135–145)
Sodium: 135 mmol/L (ref 135–145)
Sodium: 136 mmol/L (ref 135–145)
TCO2: 25 mmol/L (ref 0–100)
TCO2: 24 mmol/L (ref 0–100)
TCO2: 24 mmol/L (ref 0–100)
TCO2: 23 mmol/L (ref 0–100)
TCO2: 22 mmol/L (ref 0–100)
TCO2: 22 mmol/L (ref 0–100)

## 2016-06-08 LAB — POCT I-STAT 3, ART BLOOD GAS (G3+)
ACID-BASE DEFICIT: 4 mmol/L — AB (ref 0.0–2.0)
ACID-BASE DEFICIT: 5 mmol/L — AB (ref 0.0–2.0)
Acid-base deficit: 1 mmol/L (ref 0.0–2.0)
Acid-base deficit: 4 mmol/L — ABNORMAL HIGH (ref 0.0–2.0)
Bicarbonate: 23.5 mEq/L (ref 20.0–24.0)
Bicarbonate: 21.3 mEq/L (ref 20.0–24.0)
Bicarbonate: 20.9 mEq/L (ref 20.0–24.0)
Bicarbonate: 20.6 mEq/L (ref 20.0–24.0)
O2 SAT: 100 %
O2 SAT: 94 %
O2 Saturation: 95 %
O2 Saturation: 95 %
PCO2 ART: 36.2 mmHg (ref 35.0–45.0)
PCO2 ART: 37.1 mmHg (ref 35.0–45.0)
PH ART: 7.422 (ref 7.350–7.450)
PH ART: 7.36 (ref 7.350–7.450)
PO2 ART: 330 mmHg — AB (ref 80.0–100.0)
PO2 ART: 68 mmHg — AB (ref 80.0–100.0)
Patient temperature: 35.8
TCO2: 25 mmol/L (ref 0–100)
TCO2: 22 mmol/L (ref 0–100)
TCO2: 22 mmol/L (ref 0–100)
TCO2: 22 mmol/L (ref 0–100)
pCO2 arterial: 37.6 mmHg (ref 35.0–45.0)
pCO2 arterial: 38.5 mmHg (ref 35.0–45.0)
pH, Arterial: 7.35 (ref 7.350–7.450)
pH, Arterial: 7.333 — ABNORMAL LOW (ref 7.350–7.450)
pO2, Arterial: 78 mmHg — ABNORMAL LOW (ref 80.0–100.0)
pO2, Arterial: 78 mmHg — ABNORMAL LOW (ref 80.0–100.0)

## 2016-06-08 LAB — GLUCOSE, CAPILLARY
GLUCOSE-CAPILLARY: 120 mg/dL — AB (ref 65–99)
Glucose-Capillary: 124 mg/dL — ABNORMAL HIGH (ref 65–99)
Glucose-Capillary: 145 mg/dL — ABNORMAL HIGH (ref 65–99)
Glucose-Capillary: 109 mg/dL — ABNORMAL HIGH (ref 65–99)
Glucose-Capillary: 96 mg/dL (ref 65–99)
Glucose-Capillary: 88 mg/dL (ref 65–99)

## 2016-06-08 LAB — APTT: aPTT: 34 seconds (ref 24–37)

## 2016-06-08 LAB — PROTIME-INR
INR: 1.39 (ref 0.00–1.49)
Prothrombin Time: 17.1 seconds — ABNORMAL HIGH (ref 11.6–15.2)

## 2016-06-08 LAB — CREATININE, SERUM
Creatinine, Ser: 0.66 mg/dL (ref 0.44–1.00)
GFR calc Af Amer: >60 mL/min (ref >60)
GFR calc non Af Amer: >60 mL/min (ref >60)

## 2016-06-08 LAB — MAGNESIUM: Magnesium: 3.8 mg/dL — ABNORMAL HIGH (ref 1.7–2.4)

## 2016-06-08 LAB — POCT I-STAT 4, (NA,K, GLUC, HGB,HCT)
Glucose, Bld: 138 mg/dL — ABNORMAL HIGH (ref 65–99)
HEMATOCRIT: 31 % — AB (ref 36.0–46.0)
HEMOGLOBIN: 10.5 g/dL — AB (ref 12.0–15.0)
Potassium: 3.4 mmol/L — ABNORMAL LOW (ref 3.5–5.1)
Sodium: 140 mmol/L (ref 135–145)

## 2016-06-08 LAB — SURGICAL PCR SCREEN
MRSA, PCR: NEGATIVE
Staphylococcus aureus: NEGATIVE

## 2016-06-08 LAB — PREPARE RBC (CROSSMATCH)

## 2016-06-08 LAB — HEMOGLOBIN A1C
HEMOGLOBIN A1C: 6.2 % — AB (ref 4.8–5.6)
Mean Plasma Glucose: 131 mg/dL

## 2016-06-08 LAB — PLATELET COUNT: Platelets: 163 10*3/uL (ref 150–400)

## 2016-06-08 SURGERY — CORONARY ARTERY BYPASS GRAFTING (CABG)
Anesthesia: General | Site: Chest

## 2016-06-08 MED ORDER — LIDOCAINE 2% (20 MG/ML) 5 ML SYRINGE
INTRAMUSCULAR | Status: AC
  Filled 2016-06-08: qty 5

## 2016-06-08 MED ORDER — MORPHINE SULFATE (PF) 2 MG/ML IV SOLN
1.0000 mg | INTRAVENOUS | Status: AC | PRN
  Administered 2016-06-08: 2 mg via INTRAVENOUS

## 2016-06-08 MED ORDER — CHLORHEXIDINE GLUCONATE 0.12% ORAL RINSE (MEDLINE KIT)
15.0000 mL | Freq: Two times a day (BID) | OROMUCOSAL | Status: DC
  Administered 2016-06-08: 15 mL via OROMUCOSAL

## 2016-06-08 MED ORDER — PHENYLEPHRINE HCL 10 MG/ML IJ SOLN
0.0000 ug/min | INTRAVENOUS | Status: AC
  Administered 2016-06-08: 30 ug/min via INTRAVENOUS
  Administered 2016-06-09: 100 ug/min via INTRAVENOUS
  Administered 2016-06-09: 80 ug/min via INTRAVENOUS
  Filled 2016-06-08 (×5): qty 2

## 2016-06-08 MED ORDER — FENTANYL CITRATE (PF) 250 MCG/5ML IJ SOLN
INTRAMUSCULAR | Status: AC
  Filled 2016-06-08: qty 5

## 2016-06-08 MED ORDER — LACTATED RINGERS IV SOLN
INTRAVENOUS | Status: DC | PRN
  Administered 2016-06-08: 07:00:00 via INTRAVENOUS

## 2016-06-08 MED ORDER — ASPIRIN EC 325 MG PO TBEC
325.0000 mg | DELAYED_RELEASE_TABLET | Freq: Every day | ORAL | Status: DC
  Administered 2016-06-09 – 2016-06-11 (×3): 325 mg via ORAL
  Filled 2016-06-08 (×3): qty 1

## 2016-06-08 MED ORDER — FENTANYL CITRATE (PF) 100 MCG/2ML IJ SOLN
INTRAMUSCULAR | Status: DC | PRN
  Administered 2016-06-08: 750 ug via INTRAVENOUS
  Administered 2016-06-08: 200 ug via INTRAVENOUS
  Administered 2016-06-08: 50 ug via INTRAVENOUS
  Administered 2016-06-08 (×2): 250 ug via INTRAVENOUS

## 2016-06-08 MED ORDER — DEXMEDETOMIDINE HCL IN NACL 200 MCG/50ML IV SOLN
INTRAVENOUS | Status: DC | PRN
  Administered 2016-06-08: .3 ug/kg/h via INTRAVENOUS

## 2016-06-08 MED ORDER — METOPROLOL TARTRATE 5 MG/5ML IV SOLN
2.5000 mg | INTRAVENOUS | Status: DC | PRN
  Administered 2016-06-10: 5 mg via INTRAVENOUS
  Filled 2016-06-08: qty 5

## 2016-06-08 MED ORDER — PROTAMINE SULFATE 10 MG/ML IV SOLN
INTRAVENOUS | Status: DC | PRN
  Administered 2016-06-08: 200 mg via INTRAVENOUS

## 2016-06-08 MED ORDER — PANTOPRAZOLE SODIUM 40 MG PO TBEC
40.0000 mg | DELAYED_RELEASE_TABLET | Freq: Every day | ORAL | Status: DC
  Administered 2016-06-10 – 2016-06-15 (×6): 40 mg via ORAL
  Filled 2016-06-08 (×6): qty 1

## 2016-06-08 MED ORDER — INSULIN REGULAR BOLUS VIA INFUSION
0.0000 [IU] | Freq: Three times a day (TID) | INTRAVENOUS | Status: DC
  Filled 2016-06-08: qty 10

## 2016-06-08 MED ORDER — METOPROLOL TARTRATE 12.5 MG HALF TABLET
12.5000 mg | ORAL_TABLET | Freq: Two times a day (BID) | ORAL | Status: DC
  Administered 2016-06-11 – 2016-06-12 (×3): 12.5 mg via ORAL
  Filled 2016-06-08 (×3): qty 1

## 2016-06-08 MED ORDER — ROCURONIUM BROMIDE 50 MG/5ML IV SOLN
INTRAVENOUS | Status: AC
  Filled 2016-06-08: qty 1

## 2016-06-08 MED ORDER — BISACODYL 10 MG RE SUPP: 10.0000 mg | Freq: Every day | RECTAL | Status: DC

## 2016-06-08 MED ORDER — ALBUMIN HUMAN 5 % IV SOLN
INTRAVENOUS | Status: DC | PRN
  Administered 2016-06-08 (×2): via INTRAVENOUS

## 2016-06-08 MED ORDER — ATORVASTATIN CALCIUM 80 MG PO TABS: 80.0000 mg | ORAL_TABLET | Freq: Every day | ORAL | Status: DC

## 2016-06-08 MED ORDER — ANTISEPTIC ORAL RINSE SOLUTION (CORINZ): 7.0000 mL | Freq: Four times a day (QID) | OROMUCOSAL | Status: DC

## 2016-06-08 MED ORDER — MIDAZOLAM HCL 10 MG/2ML IJ SOLN
INTRAMUSCULAR | Status: AC
  Filled 2016-06-08: qty 2

## 2016-06-08 MED ORDER — PROPOFOL 10 MG/ML IV BOLUS
INTRAVENOUS | Status: DC | PRN
  Administered 2016-06-08: 30 mg via INTRAVENOUS

## 2016-06-08 MED ORDER — ALBUMIN HUMAN 5 % IV SOLN
250.0000 mL | INTRAVENOUS | Status: AC | PRN
  Administered 2016-06-08 – 2016-06-09 (×4): 250 mL via INTRAVENOUS
  Filled 2016-06-08 (×2): qty 250

## 2016-06-08 MED ORDER — HEMOSTATIC AGENTS (NO CHARGE) OPTIME
TOPICAL | Status: DC | PRN
  Administered 2016-06-08: 1 via TOPICAL

## 2016-06-08 MED ORDER — SODIUM CHLORIDE 0.9 % IV SOLN
INTRAVENOUS | Status: DC
  Administered 2016-06-08: 2 [IU]/h via INTRAVENOUS
  Filled 2016-06-08 (×2): qty 2.5

## 2016-06-08 MED ORDER — DOPAMINE-DEXTROSE 3.2-5 MG/ML-% IV SOLN
0.0000 ug/kg/min | INTRAVENOUS | Status: DC
Start: 2016-06-08 — End: 2016-06-09

## 2016-06-08 MED ORDER — MIDAZOLAM HCL 2 MG/2ML IJ SOLN
2.0000 mg | INTRAMUSCULAR | Status: DC | PRN
  Administered 2016-06-08: 2 mg via INTRAVENOUS
  Filled 2016-06-08: qty 2

## 2016-06-08 MED ORDER — SODIUM CHLORIDE 0.9% FLUSH
3.0000 mL | Freq: Two times a day (BID) | INTRAVENOUS | Status: DC
  Administered 2016-06-09 – 2016-06-15 (×6): 3 mL via INTRAVENOUS

## 2016-06-08 MED ORDER — ACETAMINOPHEN 650 MG RE SUPP
650.0000 mg | Freq: Once | RECTAL | Status: AC
  Administered 2016-06-08: 650 mg via RECTAL

## 2016-06-08 MED ORDER — LACTATED RINGERS IV SOLN
INTRAVENOUS | Status: DC | PRN
  Administered 2016-06-08 (×2): via INTRAVENOUS

## 2016-06-08 MED ORDER — ACETAMINOPHEN 160 MG/5ML PO SOLN: 1000.0000 mg | Freq: Four times a day (QID) | ORAL | Status: DC

## 2016-06-08 MED ORDER — CHLORHEXIDINE GLUCONATE 0.12 % MT SOLN
15.0000 mL | OROMUCOSAL | Status: AC
  Administered 2016-06-08: 15 mL via OROMUCOSAL

## 2016-06-08 MED ORDER — OXYCODONE HCL 5 MG PO TABS
5.0000 mg | ORAL_TABLET | ORAL | Status: DC | PRN
  Administered 2016-06-09 (×4): 10 mg via ORAL
  Administered 2016-06-10: 5 mg via ORAL
  Administered 2016-06-10: 10 mg via ORAL
  Administered 2016-06-11 (×3): 5 mg via ORAL
  Administered 2016-06-11: 10 mg via ORAL
  Administered 2016-06-11: 5 mg via ORAL
  Administered 2016-06-12 – 2016-06-15 (×12): 10 mg via ORAL
  Filled 2016-06-08: qty 1
  Filled 2016-06-08 (×7): qty 2
  Filled 2016-06-08 (×2): qty 1
  Filled 2016-06-08 (×6): qty 2
  Filled 2016-06-08: qty 1
  Filled 2016-06-08 (×3): qty 2
  Filled 2016-06-08: qty 1
  Filled 2016-06-08 (×2): qty 2

## 2016-06-08 MED ORDER — SODIUM CHLORIDE 0.9% FLUSH: 3.0000 mL | INTRAVENOUS | Status: DC | PRN

## 2016-06-08 MED ORDER — ACETAMINOPHEN 500 MG PO TABS: 1000.0000 mg | ORAL_TABLET | Freq: Four times a day (QID) | ORAL | Status: DC

## 2016-06-08 MED ORDER — 0.9 % SODIUM CHLORIDE (POUR BTL) OPTIME
TOPICAL | Status: DC | PRN
  Administered 2016-06-08: 6000 mL

## 2016-06-08 MED ORDER — ASPIRIN 81 MG PO CHEW: 324.0000 mg | CHEWABLE_TABLET | Freq: Every day | ORAL | Status: DC

## 2016-06-08 MED ORDER — SODIUM CHLORIDE 0.9 % IV SOLN
INTRAVENOUS | Status: DC
  Administered 2016-06-13: 20:00:00 via INTRAVENOUS

## 2016-06-08 MED ORDER — MAGNESIUM SULFATE 4 GM/100ML IV SOLN
4.0000 g | Freq: Once | INTRAVENOUS | Status: AC
  Administered 2016-06-08: 4 g via INTRAVENOUS
  Filled 2016-06-08: qty 100

## 2016-06-08 MED ORDER — DEXMEDETOMIDINE HCL IN NACL 200 MCG/50ML IV SOLN
0.0000 ug/kg/h | INTRAVENOUS | Status: DC
  Filled 2016-06-08 (×3): qty 50

## 2016-06-08 MED ORDER — PROPOFOL 10 MG/ML IV BOLUS
INTRAVENOUS | Status: AC
  Filled 2016-06-08: qty 20

## 2016-06-08 MED ORDER — DEXTROSE 5 % IV SOLN
1.5000 g | Freq: Two times a day (BID) | INTRAVENOUS | Status: AC
  Administered 2016-06-08 – 2016-06-10 (×4): 1.5 g via INTRAVENOUS
  Filled 2016-06-08 (×4): qty 1.5

## 2016-06-08 MED ORDER — HEPARIN SODIUM (PORCINE) 1000 UNIT/ML IJ SOLN
INTRAMUSCULAR | Status: DC | PRN
  Administered 2016-06-08: 22000 [IU] via INTRAVENOUS

## 2016-06-08 MED ORDER — METOPROLOL TARTRATE 25 MG/10 ML ORAL SUSPENSION: 12.5000 mg | Freq: Two times a day (BID) | ORAL | Status: DC

## 2016-06-08 MED ORDER — PHENYLEPHRINE HCL 10 MG/ML IJ SOLN
INTRAVENOUS | Status: DC | PRN
  Administered 2016-06-08: 25 ug/min via INTRAVENOUS

## 2016-06-08 MED ORDER — FAMOTIDINE IN NACL 20-0.9 MG/50ML-% IV SOLN
20.0000 mg | Freq: Two times a day (BID) | INTRAVENOUS | Status: AC
  Administered 2016-06-08: 20 mg via INTRAVENOUS

## 2016-06-08 MED ORDER — MORPHINE SULFATE (PF) 2 MG/ML IV SOLN
2.0000 mg | INTRAVENOUS | Status: DC | PRN
  Administered 2016-06-08 (×2): 2 mg via INTRAVENOUS
  Administered 2016-06-09 (×5): 4 mg via INTRAVENOUS
  Administered 2016-06-10: 2 mg via INTRAVENOUS
  Administered 2016-06-10: 4 mg via INTRAVENOUS
  Filled 2016-06-08 (×2): qty 2
  Filled 2016-06-08 (×4): qty 1
  Filled 2016-06-08: qty 2
  Filled 2016-06-08: qty 1
  Filled 2016-06-08: qty 2
  Filled 2016-06-08: qty 1
  Filled 2016-06-08: qty 2

## 2016-06-08 MED ORDER — MILRINONE LACTATE IN DEXTROSE 20-5 MG/100ML-% IV SOLN
0.2500 ug/kg/min | INTRAVENOUS | Status: DC
  Administered 2016-06-08: 0.375 ug/kg/min via INTRAVENOUS
  Filled 2016-06-08: qty 100

## 2016-06-08 MED ORDER — MILRINONE LACTATE IN DEXTROSE 20-5 MG/100ML-% IV SOLN: 0.2500 ug/kg/min | INTRAVENOUS | Status: DC

## 2016-06-08 MED ORDER — FLUTICASONE PROPIONATE 50 MCG/ACT NA SUSP
2.0000 | Freq: Every day | NASAL | Status: DC
  Administered 2016-06-15: 2 via NASAL
  Filled 2016-06-08: qty 16

## 2016-06-08 MED ORDER — ONDANSETRON HCL 4 MG/2ML IJ SOLN
INTRAMUSCULAR | Status: AC
  Filled 2016-06-08: qty 2

## 2016-06-08 MED ORDER — ROCURONIUM BROMIDE 100 MG/10ML IV SOLN
INTRAVENOUS | Status: DC | PRN
  Administered 2016-06-08 (×2): 20 mg via INTRAVENOUS
  Administered 2016-06-08: 60 mg via INTRAVENOUS
  Administered 2016-06-08: 20 mg via INTRAVENOUS

## 2016-06-08 MED ORDER — PROTAMINE SULFATE 10 MG/ML IV SOLN
INTRAVENOUS | Status: AC
  Filled 2016-06-08: qty 25

## 2016-06-08 MED ORDER — VANCOMYCIN HCL 1000 MG IV SOLR
INTRAVENOUS | Status: DC | PRN
  Administered 2016-06-08: 1500 mg via INTRAVENOUS

## 2016-06-08 MED ORDER — TRAMADOL HCL 50 MG PO TABS
50.0000 mg | ORAL_TABLET | ORAL | Status: DC | PRN
Start: 2016-06-08 — End: 2016-06-15
  Administered 2016-06-10 – 2016-06-14 (×3): 100 mg via ORAL
  Filled 2016-06-08 (×3): qty 2

## 2016-06-08 MED ORDER — LACTATED RINGERS IV SOLN: INTRAVENOUS | Status: DC

## 2016-06-08 MED ORDER — BISACODYL 5 MG PO TBEC
10.0000 mg | DELAYED_RELEASE_TABLET | Freq: Every day | ORAL | Status: DC
  Administered 2016-06-09 – 2016-06-15 (×4): 10 mg via ORAL
  Filled 2016-06-08 (×4): qty 2

## 2016-06-08 MED ORDER — MILRINONE LACTATE IN DEXTROSE 20-5 MG/100ML-% IV SOLN
0.3750 ug/kg/min | INTRAVENOUS | Status: DC
  Administered 2016-06-08 – 2016-06-09 (×2): 0.375 ug/kg/min via INTRAVENOUS
  Filled 2016-06-08 (×2): qty 100

## 2016-06-08 MED ORDER — LACTATED RINGERS IV SOLN
INTRAVENOUS | Status: DC
  Administered 2016-06-08: 23:00:00 via INTRAVENOUS

## 2016-06-08 MED ORDER — VANCOMYCIN HCL IN DEXTROSE 1-5 GM/200ML-% IV SOLN
1000.0000 mg | Freq: Once | INTRAVENOUS | Status: AC
  Administered 2016-06-08: 1000 mg via INTRAVENOUS
  Filled 2016-06-08: qty 200

## 2016-06-08 MED ORDER — SODIUM CHLORIDE 0.9 % IV SOLN
INTRAVENOUS | Status: DC | PRN
  Administered 2016-06-08: 1 [IU]/h via INTRAVENOUS

## 2016-06-08 MED ORDER — CETYLPYRIDINIUM CHLORIDE 0.05 % MT LIQD
7.0000 mL | Freq: Two times a day (BID) | OROMUCOSAL | Status: DC
  Administered 2016-06-09 – 2016-06-10 (×5): 7 mL via OROMUCOSAL

## 2016-06-08 MED ORDER — SODIUM CHLORIDE 0.45 % IV SOLN: INTRAVENOUS | Status: DC | PRN

## 2016-06-08 MED ORDER — INSULIN ASPART 100 UNIT/ML ~~LOC~~ SOLN
0.0000 [IU] | SUBCUTANEOUS | Status: DC
  Administered 2016-06-09 (×4): 2 [IU] via SUBCUTANEOUS

## 2016-06-08 MED ORDER — SODIUM CHLORIDE 0.9 % IV SOLN: 250.0000 mL | INTRAVENOUS | Status: DC

## 2016-06-08 MED ORDER — SERTRALINE HCL 50 MG PO TABS
50.0000 mg | ORAL_TABLET | Freq: Every day | ORAL | Status: DC
  Administered 2016-06-09 – 2016-06-19 (×11): 50 mg via ORAL
  Filled 2016-06-08 (×11): qty 1

## 2016-06-08 MED ORDER — FENTANYL CITRATE (PF) 250 MCG/5ML IJ SOLN
INTRAMUSCULAR | Status: AC
  Filled 2016-06-08: qty 20

## 2016-06-08 MED ORDER — DOCUSATE SODIUM 100 MG PO CAPS
200.0000 mg | ORAL_CAPSULE | Freq: Every day | ORAL | Status: DC
  Administered 2016-06-09 – 2016-06-15 (×5): 200 mg via ORAL
  Filled 2016-06-08 (×5): qty 2

## 2016-06-08 MED ORDER — MIDAZOLAM HCL 5 MG/5ML IJ SOLN
INTRAMUSCULAR | Status: DC | PRN
  Administered 2016-06-08 (×2): 2 mg via INTRAVENOUS
  Administered 2016-06-08: 4 mg via INTRAVENOUS
  Administered 2016-06-08: 2 mg via INTRAVENOUS

## 2016-06-08 MED ORDER — ONDANSETRON HCL 4 MG/2ML IJ SOLN: 4.0000 mg | Freq: Four times a day (QID) | INTRAMUSCULAR | Status: DC | PRN

## 2016-06-08 MED ORDER — SODIUM CHLORIDE 0.9 % IV SOLN
INTRAVENOUS | Status: DC | PRN
  Administered 2016-06-08: 5 g via INTRAVENOUS

## 2016-06-08 MED ORDER — LACTATED RINGERS IV SOLN: 500.0000 mL | Freq: Once | INTRAVENOUS | Status: DC | PRN

## 2016-06-08 MED ORDER — POTASSIUM CHLORIDE 10 MEQ/50ML IV SOLN
10.0000 meq | INTRAVENOUS | Status: AC
  Administered 2016-06-08 (×3): 10 meq via INTRAVENOUS

## 2016-06-08 MED ORDER — SODIUM CHLORIDE 0.9 % IJ SOLN
OROMUCOSAL | Status: DC | PRN
  Administered 2016-06-08 (×3): 4 mL via TOPICAL

## 2016-06-08 MED ORDER — ACETAMINOPHEN 160 MG/5ML PO SOLN: 650.0000 mg | Freq: Once | ORAL | Status: AC

## 2016-06-08 MED FILL — Heparin Sodium (Porcine) Inj 1000 Unit/ML: INTRAMUSCULAR | Qty: 30 | Status: AC

## 2016-06-08 MED FILL — Potassium Chloride Inj 2 mEq/ML: INTRAVENOUS | Qty: 40 | Status: AC

## 2016-06-08 MED FILL — Magnesium Sulfate Inj 50%: INTRAMUSCULAR | Qty: 10 | Status: AC

## 2016-06-08 SURGICAL SUPPLY — 71 items
ADH SKN CLS APL DERMABOND .7 (GAUZE/BANDAGES/DRESSINGS) ×2
BAG DECANTER FOR FLEXI CONT (MISCELLANEOUS) ×3 IMPLANT
BANDAGE ACE 4X5 VEL STRL LF (GAUZE/BANDAGES/DRESSINGS) ×1 IMPLANT
BANDAGE ACE 6X5 VEL STRL LF (GAUZE/BANDAGES/DRESSINGS) ×1 IMPLANT
BANDAGE ELASTIC 4 VELCRO ST LF (GAUZE/BANDAGES/DRESSINGS) ×3 IMPLANT
BANDAGE ELASTIC 6 VELCRO ST LF (GAUZE/BANDAGES/DRESSINGS) ×3 IMPLANT
BLADE STERNUM SYSTEM 6 (BLADE) ×3 IMPLANT
BLADE SURG 11 STRL SS (BLADE) ×1 IMPLANT
BNDG GAUZE ELAST 4 BULKY (GAUZE/BANDAGES/DRESSINGS) ×3 IMPLANT
CANISTER SUCTION 2500CC (MISCELLANEOUS) ×3 IMPLANT
CATH CPB KIT GERHARDT (MISCELLANEOUS) ×3 IMPLANT
CATH THORACIC 28FR (CATHETERS) ×3 IMPLANT
CRADLE DONUT ADULT HEAD (MISCELLANEOUS) ×3 IMPLANT
DERMABOND ADVANCED (GAUZE/BANDAGES/DRESSINGS) ×1
DERMABOND ADVANCED .7 DNX12 (GAUZE/BANDAGES/DRESSINGS) IMPLANT
DRAIN CHANNEL 28F RND 3/8 FF (WOUND CARE) ×3 IMPLANT
DRAPE CARDIOVASCULAR INCISE (DRAPES) ×3
DRAPE SLUSH/WARMER DISC (DRAPES) ×3 IMPLANT
DRAPE SRG 135X102X78XABS (DRAPES) ×2 IMPLANT
DRSG AQUACEL AG ADV 3.5X14 (GAUZE/BANDAGES/DRESSINGS) ×3 IMPLANT
ELECT BLADE 4.0 EZ CLEAN MEGAD (MISCELLANEOUS) ×3
ELECT REM PT RETURN 9FT ADLT (ELECTROSURGICAL) ×6
ELECTRODE BLDE 4.0 EZ CLN MEGD (MISCELLANEOUS) ×2 IMPLANT
ELECTRODE REM PT RTRN 9FT ADLT (ELECTROSURGICAL) ×4 IMPLANT
FELT TEFLON 1X6 (MISCELLANEOUS) ×3 IMPLANT
GAUZE SPONGE 4X4 12PLY STRL (GAUZE/BANDAGES/DRESSINGS) ×6 IMPLANT
GLOVE BIO SURGEON STRL SZ 6.5 (GLOVE) ×12 IMPLANT
GLOVE BIOGEL PI IND STRL 6.5 (GLOVE) IMPLANT
GLOVE BIOGEL PI INDICATOR 6.5 (GLOVE) ×5
GOWN STRL REUS W/ TWL LRG LVL3 (GOWN DISPOSABLE) ×8 IMPLANT
GOWN STRL REUS W/TWL LRG LVL3 (GOWN DISPOSABLE) ×21
HEMOSTAT POWDER SURGIFOAM 1G (HEMOSTASIS) ×9 IMPLANT
HEMOSTAT SURGICEL 2X14 (HEMOSTASIS) ×3 IMPLANT
KIT BASIN OR (CUSTOM PROCEDURE TRAY) ×3 IMPLANT
KIT CATH SUCT 8FR (CATHETERS) ×3 IMPLANT
KIT ROOM TURNOVER OR (KITS) ×3 IMPLANT
KIT SUCTION CATH 14FR (SUCTIONS) ×6 IMPLANT
KIT VASOVIEW 6 PRO VH 2400 (KITS) ×3 IMPLANT
LEAD PACING MYOCARDI (MISCELLANEOUS) ×5 IMPLANT
MARKER GRAFT CORONARY BYPASS (MISCELLANEOUS) ×9 IMPLANT
NS IRRIG 1000ML POUR BTL (IV SOLUTION) ×16 IMPLANT
PACK OPEN HEART (CUSTOM PROCEDURE TRAY) ×3 IMPLANT
PAD ARMBOARD 7.5X6 YLW CONV (MISCELLANEOUS) ×6 IMPLANT
PAD ELECT DEFIB RADIOL ZOLL (MISCELLANEOUS) ×3 IMPLANT
PENCIL BUTTON HOLSTER BLD 10FT (ELECTRODE) ×3 IMPLANT
PUNCH AORTIC ROTATE  4.5MM 8IN (MISCELLANEOUS) ×1 IMPLANT
SET CARDIOPLEGIA MPS 5001102 (MISCELLANEOUS) ×1 IMPLANT
SPONGE GAUZE 4X4 12PLY STER LF (GAUZE/BANDAGES/DRESSINGS) ×2 IMPLANT
SPONGE LAP 18X18 X RAY DECT (DISPOSABLE) ×3 IMPLANT
SUT BONE WAX W31G (SUTURE) ×3 IMPLANT
SUT MNCRL AB 4-0 PS2 18 (SUTURE) ×1 IMPLANT
SUT PROLENE 3 0 SH1 36 (SUTURE) ×4 IMPLANT
SUT PROLENE 4 0 TF (SUTURE) ×6 IMPLANT
SUT PROLENE 6 0 C 1 30 (SUTURE) ×4 IMPLANT
SUT PROLENE 6 0 CC (SUTURE) ×7 IMPLANT
SUT PROLENE 7 0 BV1 MDA (SUTURE) ×8 IMPLANT
SUT PROLENE 8 0 BV175 6 (SUTURE) ×1 IMPLANT
SUT STEEL 6MS V (SUTURE) ×3 IMPLANT
SUT STEEL SZ 6 DBL 3X14 BALL (SUTURE) ×3 IMPLANT
SUT VIC AB 1 CTX 18 (SUTURE) ×6 IMPLANT
SUT VIC AB 2-0 CT1 27 (SUTURE) ×3
SUT VIC AB 2-0 CT1 TAPERPNT 27 (SUTURE) IMPLANT
SUTURE E-PAK OPEN HEART (SUTURE) ×3 IMPLANT
SYSTEM SAHARA CHEST DRAIN ATS (WOUND CARE) ×3 IMPLANT
TAPE CLOTH SURG 4X10 WHT LF (GAUZE/BANDAGES/DRESSINGS) ×2 IMPLANT
TOWEL OR 17X24 6PK STRL BLUE (TOWEL DISPOSABLE) ×6 IMPLANT
TOWEL OR 17X26 10 PK STRL BLUE (TOWEL DISPOSABLE) ×6 IMPLANT
TRAY FOLEY IC TEMP SENS 16FR (CATHETERS) ×3 IMPLANT
TUBING INSUFFLATION (TUBING) ×3 IMPLANT
UNDERPAD 30X30 INCONTINENT (UNDERPADS AND DIAPERS) ×3 IMPLANT
WATER STERILE IRR 1000ML POUR (IV SOLUTION) ×6 IMPLANT

## 2016-06-08 NOTE — Anesthesia Procedure Notes (Signed)
Central Venous Catheter Insertion Performed by: anesthesiologist Patient location: Pre-op. Preanesthetic checklist: patient identified, IV checked, site marked, risks and benefits discussed, surgical consent, monitors and equipment checked, pre-op evaluation, timeout performed and anesthesia consent Lidocaine 1% used for infiltration Landmarks identified Catheter size: 9 Fr Sheath introducer Procedure performed using ultrasound guided technique. Attempts: 1 Following insertion, line sutured and dressing applied. Post procedure assessment: blood return through all ports, free fluid flow and no air. Patient tolerated the procedure well with no immediate complications.

## 2016-06-08 NOTE — Progress Notes (Signed)
[address]       [place] [address]             [phone]      No changes since seen 7/21, see previous in patient  note Plan to proceed with CABG today  [name] MD      [address] [place] [address] Office [phone]   [place]

## 2016-06-08 NOTE — Progress Notes (Signed)
OR has called to pick up Pt at 06 A.M.

## 2016-06-08 NOTE — Transfer of Care (Signed)
Immediate Anesthesia Transfer of Care Note  Patient: Elizabeth [name]  Procedure(s) Performed: Procedure(s): CORONARY ARTERY BYPASS GRAFTING (CABG) X 4  With Right Greater Saphenous vein endoscopic harvesting (N/A) TRANSESOPHAGEAL ECHOCARDIOGRAM (TEE) (N/A)  Patient Location: SICU  Anesthesia Type:General  Level of Consciousness: sedated, unresponsive and Patient remains intubated per anesthesia plan  Airway & Oxygen Therapy: Patient remains intubated per anesthesia plan and Patient placed on Ventilator (see vital sign flow sheet for setting)  Post-op Assessment: Report given to RN and Post -op Vital signs reviewed and stable  Post vital signs: Reviewed and stable  Last Vitals:  Vitals:   06/08/16 0519 06/08/16 1415  BP: 115/75 114/64  Pulse: 61 90  Resp: 18 12  Temp: 37 C     Last Pain:  Vitals:   06/08/16 0519  TempSrc: Oral  PainSc:       Patients Stated Pain Goal: 2 (68/37/29 0211)  Complications: No apparent anesthesia complications

## 2016-06-08 NOTE — Progress Notes (Signed)
Pt is in the OR.

## 2016-06-08 NOTE — Progress Notes (Deleted)
Intraoperative transesophageal echocardiogram has been performed.

## 2016-06-08 NOTE — Progress Notes (Signed)
Heart monitor is removed, cleaned and put in the box.

## 2016-06-08 NOTE — Anesthesia Postprocedure Evaluation (Signed)
Anesthesia Post Note  Patient: [place]  Procedure(s) Performed: Procedure(s) (LRB): CORONARY ARTERY BYPASS GRAFTING (CABG) X 4  With Right Greater Saphenous vein endoscopic harvesting (N/A) TRANSESOPHAGEAL ECHOCARDIOGRAM (TEE) (N/A)  Patient location during evaluation: SICU Anesthesia Type: General Level of consciousness: patient remains intubated per anesthesia plan and sedated Pain management: pain level controlled Vital Signs Assessment: post-procedure vital signs reviewed and stable Respiratory status: patient on ventilator - see flowsheet for VS and patient remains intubated per anesthesia plan Cardiovascular status: stable Postop Assessment: no signs of nausea or vomiting Anesthetic complications: no    Last Vitals:  Vitals:   06/08/16 1630 06/08/16 1645  BP: 91/73 97/76  Pulse:    Resp: (!) 28 17  Temp: (!) 36 C (!) 36 C    Last Pain:  Vitals:   06/08/16 1500  TempSrc: Core (Comment)  PainSc:                  Lainee Lehrman,E. Kaity Pitstick

## 2016-06-08 NOTE — Progress Notes (Signed)
This RN offered Pt to watch the video, but she declines the offer.

## 2016-06-08 NOTE — Procedures (Signed)
Extubation Procedure Note  Patient Details:   Name: Elizabeth [name] DOB: 12-Dec-1955 MRN: [id number]  Patient extubated to 4L [place]. Patient able to state name. Patient got NIF -22 and VC 720. Patient in no distress. No stridor noted. RN at beside.     Evaluation  O2 sats: stable throughout Complications: No apparent complications Patient did tolerate procedure well. Bilateral Breath Sounds: Clear   Yes  Kelle Darting 06/08/2016, 2028

## 2016-06-08 NOTE — Anesthesia Procedure Notes (Signed)
Central Venous Catheter Insertion Performed by: anesthesiologist Patient location: Pre-op. Preanesthetic checklist: patient identified, IV checked, site marked, risks and benefits discussed, surgical consent, monitors and equipment checked, pre-op evaluation, timeout performed and anesthesia consent Landmarks identified PA cath was placed.Swan type and PA catheter depth:thermodilation and 50PA Cath depth:50 Procedure performed without using ultrasound guided technique. Attempts: 1 Patient tolerated the procedure well with no immediate complications.

## 2016-06-08 NOTE — Brief Op Note (Addendum)
[address]       [place] [address]             [phone]     06/08/2016  11:44 AM  PATIENT:  Elizabeth [name]  60 y.o. female  PRE-OPERATIVE DIAGNOSIS:  CAD  POST-OPERATIVE DIAGNOSIS:  CAD  PROCEDURE:  TRANSESOPHAGEAL ECHOCARDIOGRAM (TEE), MEDIAN STERNOTOMY for CORONARY ARTERY BYPASS GRAFTING (CABG) X 4 (LIMA to LAD, SVG SEQUENTIALLY to DISTAL CIRCUMFLEX and OM2, SVG to DISTAL RCA) with right greater saphenous vein endoscopic harvesting   SURGEON:  Surgeon(s) and Role:    * [name], MD - Primary  PHYSICIAN ASSISTANT: Lars Pinks PA-C  ANESTHESIA:   general  EBL:  Total I/O In: 1000 [I.V.:1000] Out: 450 [Urine:450]  DRAINS: Chest tubes placed in the mediastinal and pleural spaces   COUNTS CORRECT:  YES  DICTATION: .Dragon Dictation  PLAN OF CARE: Admit to inpatient   PATIENT DISPOSITION:  ICU - intubated and hemodynamically stable.   Delay start of Pharmacological VTE agent (>24hrs) due to surgical blood loss or risk of bleeding: yes  BASELINE WEIGHT: 92 kg

## 2016-06-08 NOTE — Progress Notes (Signed)
Patient ID: Elizabeth [name], female   DOB: Nov 06, 1956, 60 y.o.   MRN: [id number] EVENING ROUNDS NOTE :     [address]       [place] [address]             [phone]                 Day of Surgery Procedure(s) (LRB): CORONARY ARTERY BYPASS GRAFTING (CABG) X 4  With Right Greater Saphenous vein endoscopic harvesting (N/A) TRANSESOPHAGEAL ECHOCARDIOGRAM (TEE) (N/A)  Total Length of Stay:  LOS: 5 days  BP 91/68 (BP Location: Left Arm)   Pulse 90   Temp 97.2 F (36.2 C)   Resp (!) 27   Ht _0  (1.575 m)   Wt 201 lb 3.2 oz (91.3 kg)   LMP 12/30/1994   SpO2 100%   BMI 36.80 kg/m   .Intake/Output      07/24 0701 - 07/25 0700   I.V. (mL/kg) 2558.5 (28)   Blood 575   IV Piggyback 1250   Total Intake(mL/kg) 4383.5 (48)   Urine (mL/kg/hr) 3000 (2.7)   Stool    Blood 1635 (1.4)   Chest Tube 220 (0.2)   Total Output 4855   Net -471.5         . sodium chloride    . [START ON 06/09/2016] sodium chloride    . sodium chloride 20 mL/hr at 06/08/16 1900  . dexmedetomidine Stopped (06/08/16 1800)  . DOPamine Stopped (06/08/16 1415)  . insulin (NOVOLIN-R) infusion 2 Units/hr (06/08/16 1800)  . lactated ringers 20 mL/hr at 06/08/16 1900  . lactated ringers 20 mL/hr at 06/08/16 1900  . milrinone 0.375 mcg/kg/min (06/08/16 1900)  . phenylephrine (NEO-SYNEPHRINE) Adult infusion 50 mcg/min (06/08/16 1900)     Lab Results  Component Value Date   WBC 14.0 (H) 06/08/2016   HGB 10.5 (L) 06/08/2016   HCT 31.0 (L) 06/08/2016   PLT 127 (L) 06/08/2016   GLUCOSE 138 (H) 06/08/2016   CHOL 218 (H) 05/26/2016   TRIG 102 05/26/2016   HDL 49 05/26/2016   LDLDIRECT 145.2 10/29/2011   LDLCALC 149 (H) 05/26/2016   ALT 175 (H) 06/07/2016   AST 238 (H) 06/07/2016   NA 140 06/08/2016   K 3.4 (L) 06/08/2016   CL 99 (L) 06/08/2016   CREATININE 0.60 06/08/2016   BUN 5 (L) 06/08/2016   CO2 22 06/07/2016   TSH 1.708 03/06/2016   INR 1.39 06/08/2016   HGBA1C 6.2 (H)  06/07/2016    Hold tylenol and high dose Lipitor , until sure lfts are stable , went up as patient was started on lipitor 80 mg day from 7/15 to 7/23   [name] MD  [facility] [phone] Office [phone] 06/08/2016 7:24 PM

## 2016-06-08 NOTE — Progress Notes (Signed)
Intraoperative transesopageal echocardiogram has been performed.

## 2016-06-08 NOTE — Anesthesia Procedure Notes (Signed)
Procedure Name: Intubation Date/Time: 06/08/2016 7:50 AM Performed by: Rebekah Chesterfield L Pre-anesthesia Checklist: Patient identified, Emergency Drugs available, Suction available and Patient being monitored Patient Re-evaluated:Patient Re-evaluated prior to inductionOxygen Delivery Method: Circle System Utilized Preoxygenation: Pre-oxygenation with 100% oxygen Intubation Type: IV induction Ventilation: Mask ventilation with difficulty and Oral airway inserted - appropriate to patient size Laryngoscope Size: Mac and 3 Grade View: Grade II Tube type: Subglottic suction tube Tube size: 7.5 mm Number of attempts: 1 Airway Equipment and Method: Stylet Placement Confirmation: ETT inserted through vocal cords under direct vision,  positive ETCO2 and breath sounds checked- equal and bilateral Secured at: 20 cm Tube secured with: Tape Dental Injury: Teeth and Oropharynx as per pre-operative assessment

## 2016-06-09 ENCOUNTER — Encounter (HOSPITAL_COMMUNITY): Payer: Self-pay | Admitting: *Deleted

## 2016-06-09 ENCOUNTER — Inpatient Hospital Stay (HOSPITAL_COMMUNITY): Payer: Medicare Other

## 2016-06-09 LAB — CBC
HEMATOCRIT: 25.1 % — AB (ref 36.0–46.0)
HEMATOCRIT: 27.3 % — AB (ref 36.0–46.0)
HEMOGLOBIN: 7.9 g/dL — AB (ref 12.0–15.0)
Hemoglobin: 8.9 g/dL — ABNORMAL LOW (ref 12.0–15.0)
MCH: 26.7 pg (ref 26.0–34.0)
MCH: 27.7 pg (ref 26.0–34.0)
MCHC: 31.5 g/dL (ref 30.0–36.0)
MCHC: 32.6 g/dL (ref 30.0–36.0)
MCV: 84.8 fL (ref 78.0–100.0)
MCV: 85 fL (ref 78.0–100.0)
Platelets: 133 10*3/uL — ABNORMAL LOW (ref 150–400)
Platelets: 151 10*3/uL (ref 150–400)
RBC: 2.96 MIL/uL — ABNORMAL LOW (ref 3.87–5.11)
RBC: 3.21 MIL/uL — AB (ref 3.87–5.11)
RDW: 14.8 % (ref 11.5–15.5)
RDW: 15 % (ref 11.5–15.5)
WBC: 10.5 10*3/uL (ref 4.0–10.5)
WBC: 16 10*3/uL — AB (ref 4.0–10.5)

## 2016-06-09 LAB — GLUCOSE, CAPILLARY
GLUCOSE-CAPILLARY: 110 mg/dL — AB (ref 65–99)
GLUCOSE-CAPILLARY: 113 mg/dL — AB (ref 65–99)
GLUCOSE-CAPILLARY: 136 mg/dL — AB (ref 65–99)
Glucose-Capillary: 106 mg/dL — ABNORMAL HIGH (ref 65–99)
Glucose-Capillary: 140 mg/dL — ABNORMAL HIGH (ref 65–99)
Glucose-Capillary: 150 mg/dL — ABNORMAL HIGH (ref 65–99)

## 2016-06-09 LAB — POCT I-STAT 3, ART BLOOD GAS (G3+)
Acid-base deficit: 5 mmol/L — ABNORMAL HIGH (ref 0.0–2.0)
Bicarbonate: 20.6 mEq/L (ref 20.0–24.0)
O2 SAT: 88 %
PCO2 ART: 40.7 mmHg (ref 35.0–45.0)
PH ART: 7.311 — AB (ref 7.350–7.450)
TCO2: 22 mmol/L (ref 0–100)
pO2, Arterial: 59 mmHg — ABNORMAL LOW (ref 80.0–100.0)

## 2016-06-09 LAB — COMPREHENSIVE METABOLIC PANEL
ALT: 85 U/L — ABNORMAL HIGH (ref 14–54)
AST: 127 U/L — ABNORMAL HIGH (ref 15–41)
Albumin: 3 g/dL — ABNORMAL LOW (ref 3.5–5.0)
Alkaline Phosphatase: 409 U/L — ABNORMAL HIGH (ref 38–126)
Anion gap: 5 (ref 5–15)
BUN: <5 mg/dL — ABNORMAL LOW (ref 6–20)
CO2: 21 mmol/L — ABNORMAL LOW (ref 22–32)
Calcium: 7.6 mg/dL — ABNORMAL LOW (ref 8.9–10.3)
Chloride: 107 mmol/L (ref 101–111)
Creatinine, Ser: 0.77 mg/dL (ref 0.44–1.00)
GFR calc Af Amer: >60 mL/min (ref >60)
GFR calc non Af Amer: >60 mL/min (ref >60)
Glucose, Bld: 126 mg/dL — ABNORMAL HIGH (ref 65–99)
Potassium: 4 mmol/L (ref 3.5–5.1)
Sodium: 133 mmol/L — ABNORMAL LOW (ref 135–145)
Total Bilirubin: 2.7 mg/dL — ABNORMAL HIGH (ref 0.3–1.2)
Total Protein: 5.6 g/dL — ABNORMAL LOW (ref 6.5–8.1)

## 2016-06-09 LAB — CREATININE, SERUM: Creatinine, Ser: 0.88 mg/dL (ref 0.44–1.00)

## 2016-06-09 LAB — POCT I-STAT, CHEM 8
BUN: 5 mg/dL — ABNORMAL LOW (ref 6–20)
Calcium, Ion: 1.19 mmol/L (ref 1.13–1.30)
Chloride: 103 mmol/L (ref 101–111)
Creatinine, Ser: 0.8 mg/dL (ref 0.44–1.00)
Glucose, Bld: 126 mg/dL — ABNORMAL HIGH (ref 65–99)
HEMATOCRIT: 29 % — AB (ref 36.0–46.0)
HEMOGLOBIN: 9.9 g/dL — AB (ref 12.0–15.0)
POTASSIUM: 4.3 mmol/L (ref 3.5–5.1)
Sodium: 133 mmol/L — ABNORMAL LOW (ref 135–145)
TCO2: 21 mmol/L (ref 0–100)

## 2016-06-09 LAB — MAGNESIUM
MAGNESIUM: 3.1 mg/dL — AB (ref 1.7–2.4)
Magnesium: 3.3 mg/dL — ABNORMAL HIGH (ref 1.7–2.4)

## 2016-06-09 LAB — HEMOGLOBIN AND HEMATOCRIT, BLOOD
HCT: 25.2 % — ABNORMAL LOW (ref 36.0–46.0)
Hemoglobin: 8 g/dL — ABNORMAL LOW (ref 12.0–15.0)

## 2016-06-09 LAB — PREPARE RBC (CROSSMATCH)

## 2016-06-09 MED ORDER — INSULIN ASPART 100 UNIT/ML ~~LOC~~ SOLN
0.0000 [IU] | SUBCUTANEOUS | Status: DC
  Administered 2016-06-10 – 2016-06-13 (×5): 2 [IU] via SUBCUTANEOUS
  Administered 2016-06-14: 4 [IU] via SUBCUTANEOUS
  Administered 2016-06-14 – 2016-06-15 (×2): 2 [IU] via SUBCUTANEOUS

## 2016-06-09 MED ORDER — ENOXAPARIN SODIUM 30 MG/0.3ML ~~LOC~~ SOLN
30.0000 mg | SUBCUTANEOUS | Status: DC
  Administered 2016-06-10 – 2016-06-13 (×4): 30 mg via SUBCUTANEOUS
  Filled 2016-06-09 (×4): qty 0.3

## 2016-06-09 MED ORDER — FUROSEMIDE 10 MG/ML IJ SOLN
40.0000 mg | Freq: Once | INTRAMUSCULAR | Status: AC
  Administered 2016-06-09: 40 mg via INTRAVENOUS
  Filled 2016-06-09: qty 4

## 2016-06-09 MED ORDER — PHENYLEPHRINE HCL 10 MG/ML IJ SOLN
0.0000 ug/min | INTRAVENOUS | Status: DC
  Administered 2016-06-09: 100 ug/min via INTRAVENOUS
  Administered 2016-06-09: 50 ug/min via INTRAVENOUS
  Filled 2016-06-09 (×2): qty 4

## 2016-06-09 MED ORDER — LEVALBUTEROL HCL 0.63 MG/3ML IN NEBU
0.6300 mg | INHALATION_SOLUTION | Freq: Four times a day (QID) | RESPIRATORY_TRACT | Status: DC | PRN
  Administered 2016-06-09: 0.63 mg via RESPIRATORY_TRACT
  Filled 2016-06-09: qty 3

## 2016-06-09 MED ORDER — SODIUM CHLORIDE 0.9 % IV SOLN
Freq: Once | INTRAVENOUS | Status: AC
  Administered 2016-06-09: 05:00:00 via INTRAVENOUS

## 2016-06-09 MED FILL — Dexmedetomidine HCl in NaCl 0.9% IV Soln 400 MCG/100ML: INTRAVENOUS | Qty: 100 | Status: AC

## 2016-06-09 MED FILL — Lidocaine HCl IV Inj 20 MG/ML: INTRAVENOUS | Qty: 5 | Status: AC

## 2016-06-09 MED FILL — Mannitol IV Soln 20%: INTRAVENOUS | Qty: 500 | Status: AC

## 2016-06-09 MED FILL — Heparin Sodium (Porcine) Inj 1000 Unit/ML: INTRAMUSCULAR | Qty: 10 | Status: AC

## 2016-06-09 MED FILL — Electrolyte-R (PH 7.4) Solution: INTRAVENOUS | Qty: 5000 | Status: AC

## 2016-06-09 MED FILL — Sodium Bicarbonate IV Soln 8.4%: INTRAVENOUS | Qty: 50 | Status: AC

## 2016-06-09 MED FILL — Sodium Chloride IV Soln 0.9%: INTRAVENOUS | Qty: 2000 | Status: AC

## 2016-06-09 NOTE — Progress Notes (Signed)
Patient ID: Elizabeth [name], female   DOB: 03/09/1956, 60 y.o.   MRN: [id number] TCTS DAILY ICU PROGRESS NOTE                   [address]            [place] [address]          [phone]   1 Day Post-Op Procedure(s) (LRB): CORONARY ARTERY BYPASS GRAFTING (CABG) X 4  With Right Greater Saphenous vein endoscopic harvesting (N/A) TRANSESOPHAGEAL ECHOCARDIOGRAM (TEE) (N/A)  Total Length of Stay:  LOS: 6 days   Subjective: Extubated, alert and  neuro intact Objective: Vital signs in last 24 hours: Temp:  [96.4 F (35.8 C)-98.4 F (36.9 C)] 98.4 F (36.9 C) (07/25 0715) Pulse Rate:  [60-91] 88 (07/25 0715) Cardiac Rhythm: Atrial paced (07/25 0400) Resp:  [11-33] 20 (07/25 0715) BP: (65-117)/(30-82) 103/73 (07/25 0700) SpO2:  [92 %-100 %] 95 % (07/25 0715) Arterial Line BP: (85-140)/(18-73) 103/55 (07/25 0715) FiO2 (%):  [40 %-50 %] 40 % (07/24 2005) Weight:  [198 lb 3.1 oz (89.9 kg)] 198 lb 3.1 oz (89.9 kg) (07/25 0500)  Filed Weights   06/07/16 0400 06/08/16 0519 06/09/16 0500  Weight: 202 lb 6.4 oz (91.8 kg) 201 lb 3.2 oz (91.3 kg) 198 lb 3.1 oz (89.9 kg)    Weight change: -3 lb 0.1 oz (-1.364 kg)   Hemodynamic parameters for last 24 hours: PAP: (35-69)/(17-38) 54/29 CO:  [2.9 L/min-4.2 L/min] 3.7 L/min CI:  [1.5 L/min/m2-2.2 L/min/m2] 1.9 L/min/m2  Intake/Output from previous day: 07/24 0701 - 07/25 0700 In: 6358.8 [I.V.:3714.8; XGZFP:825; IV PGFQMKJIZ:1281] Out: 1886 [Urine:3505; Blood:1635; Chest Tube:710]  Intake/Output this shift: No intake/output data recorded.  Current Meds: Scheduled Meds: . antiseptic oral rinse  7 mL Mouth Rinse BID  . aspirin EC  325 mg Oral Daily   Or  . aspirin  324 mg Per Tube Daily  . bisacodyl  10 mg Oral Daily   Or  . bisacodyl  10 mg Rectal Daily  . cefUROXime (ZINACEF)  IV  1.5 g Intravenous Q12H  . docusate sodium  200 mg Oral Daily  . famotidine (PEPCID) IV  20 mg Intravenous Q12H  .  fluticasone  2 spray Each Nare Daily  . insulin aspart  0-24 Units Subcutaneous Q4H  . metoprolol tartrate  12.5 mg Oral BID   Or  . metoprolol tartrate  12.5 mg Per Tube BID  . [START ON 06/10/2016] pantoprazole  40 mg Oral Daily  . sertraline  50 mg Oral Daily  . sodium chloride flush  3 mL Intravenous Q12H   Continuous Infusions: . sodium chloride 20 mL/hr at 06/08/16 2000  . sodium chloride    . sodium chloride 20 mL/hr at 06/08/16 1900  . dexmedetomidine Stopped (06/08/16 1800)  . DOPamine Stopped (06/08/16 1415)  . lactated ringers Stopped (06/08/16 2000)  . lactated ringers 20 mL/hr at 06/08/16 2300  . milrinone 0.375 mcg/kg/min (06/09/16 0444)  . phenylephrine (NEO-SYNEPHRINE) Adult infusion 100 mcg/min (06/09/16 0636)   PRN Meds:.sodium chloride, lactated ringers, metoprolol, midazolam, morphine injection, ondansetron (ZOFRAN) IV, oxyCODONE, sodium chloride flush, traMADol  General appearance: alert, cooperative and no distress Neurologic: intact Heart: regular rate and rhythm, S1, S2 normal, no murmur, click, rub or gallop Lungs: diminished breath sounds bibasilar Abdomen: soft, non-tender; bowel sounds normal; no masses,  no organomegaly Extremities: extremities normal, atraumatic, no cyanosis or edema and Homans sign is negative, no sign of DVT  Wound: sternum stable  Lab Results: CBC:  Recent Labs  06/08/16 2020 06/09/16 0257  WBC 10.8* 10.5  HGB 8.7* 7.9*  HCT 27.6* 25.1*  PLT 144* 133*   BMET:  Recent Labs  06/07/16 0649  06/08/16 1958 06/08/16 2020 06/09/16 0257  NA 134*  < > 139  --  133*  K 3.4*  < > 4.5  --  4.0  CL 103  < > 107  --  107  CO2 22  --   --   --  21*  GLUCOSE 107*  < > 86  --  126*  BUN 9  < > 5*  --  <5*  CREATININE 0.84  < > 0.70 0.66 0.77  CALCIUM 9.1  --   --   --  7.6*  < > = values in this interval not displayed.  PT/INR:   Recent Labs  06/08/16 1419  LABPROT 17.1*  INR 1.39      Radiology: Dg Chest Port 1  View  Result Date: 06/08/2016 CLINICAL DATA:  Status post CABG surgery. EXAM: PORTABLE CHEST 1 VIEW COMPARISON:  06/01/2016 FINDINGS: Since the prior study, CABG surgery has been performed. Standard lines and tubes are in place, including: An endotracheal tube with its tip 3.3 cm above the Carina, a right internal jugular Swan-Ganz catheter with its tip in the right pulmonary artery, a nasal/orogastric tube passing well below the diaphragm into the stomach, a mediastinal tube and a left chest tube, the latter with its tip at the left apex. There is central vascular congestion and interstitial thickening consistent with mild pulmonary edema. No gross pneumothorax noted on this supine study. There is no mediastinal widening. IMPRESSION: 1. Status post CABG surgery. 2. Mild pulmonary edema, with this appearance accentuated by the supine technique. 3. No mediastinal widening.  No gross pneumothorax. 4. Support apparatus is well positioned. Electronically Signed   By: Lajean Manes M.D.   On: 06/08/2016 14:49    Assessment/Plan: S/P Procedure(s) (LRB): CORONARY ARTERY BYPASS GRAFTING (CABG) X 4  With Right Greater Saphenous vein endoscopic harvesting (N/A) TRANSESOPHAGEAL ECHOCARDIOGRAM (TEE) (N/A) Mobilize Diuresis Diabetes control See progression orders Expected Acute  Blood - loss Anemia- given one unit prbc last night transent elevation of lfts ? Related to high dose statin, improved today   [name] 06/09/2016 7:31 AM

## 2016-06-09 NOTE — Progress Notes (Signed)
CT surgery p.m. Rounds  Patient examined and record reviewed.Hemodynamics stable,labs satisfactory.Patient had stable day.Continue current care. Hold Lovenox this p.m. because of bleeding from IJ site [employer] Trigt III 06/09/2016

## 2016-06-09 NOTE — Care Management Important Message (Signed)
Important Message  Patient Details  Name: Elizabeth [name] MRN: [id number] Date of Birth: [birth year]   Medicare Important Message Given:  Yes    Nathen May 06/09/2016, 10:33 AM

## 2016-06-09 NOTE — Progress Notes (Signed)
Patient started hyperventilating, panicking, wheezing and complaining she was short of breath. Oxygen saturation 95% on 6L [place]. Morphine 77m given and called MD. Received order for xopenex breathing treatment and 40 lasix. Will continue to monitor closely.   RMaia Petties RN

## 2016-06-10 ENCOUNTER — Inpatient Hospital Stay (HOSPITAL_COMMUNITY): Payer: Medicare Other

## 2016-06-10 DIAGNOSIS — E785 Hyperlipidemia, unspecified: Secondary | ICD-10-CM

## 2016-06-10 DIAGNOSIS — I5042 Chronic combined systolic (congestive) and diastolic (congestive) heart failure: Secondary | ICD-10-CM

## 2016-06-10 DIAGNOSIS — R748 Abnormal levels of other serum enzymes: Secondary | ICD-10-CM

## 2016-06-10 LAB — POCT I-STAT 3, ART BLOOD GAS (G3+)
Acid-base deficit: 3 mmol/L — ABNORMAL HIGH (ref 0.0–2.0)
Bicarbonate: 20.5 mEq/L (ref 20.0–24.0)
O2 Saturation: 92 %
PCO2 ART: 29.6 mmHg — AB (ref 35.0–45.0)
TCO2: 21 mmol/L (ref 0–100)
pH, Arterial: 7.447 (ref 7.350–7.450)
pO2, Arterial: 58 mmHg — ABNORMAL LOW (ref 80.0–100.0)

## 2016-06-10 LAB — COMPREHENSIVE METABOLIC PANEL
ALT: 78 U/L — ABNORMAL HIGH (ref 14–54)
AST: 88 U/L — ABNORMAL HIGH (ref 15–41)
Albumin: 2.7 g/dL — ABNORMAL LOW (ref 3.5–5.0)
Alkaline Phosphatase: 372 U/L — ABNORMAL HIGH (ref 38–126)
Anion gap: 7 (ref 5–15)
BUN: 8 mg/dL (ref 6–20)
CO2: 22 mmol/L (ref 22–32)
Calcium: 8.4 mg/dL — ABNORMAL LOW (ref 8.9–10.3)
Chloride: 103 mmol/L (ref 101–111)
Creatinine, Ser: 0.86 mg/dL (ref 0.44–1.00)
GFR calc Af Amer: >60 mL/min (ref >60)
GFR calc non Af Amer: >60 mL/min (ref >60)
Glucose, Bld: 113 mg/dL — ABNORMAL HIGH (ref 65–99)
Potassium: 4.2 mmol/L (ref 3.5–5.1)
Sodium: 132 mmol/L — ABNORMAL LOW (ref 135–145)
Total Bilirubin: 2.6 mg/dL — ABNORMAL HIGH (ref 0.3–1.2)
Total Protein: 6.2 g/dL — ABNORMAL LOW (ref 6.5–8.1)

## 2016-06-10 LAB — HEPATIC FUNCTION PANEL
ALT: 79 U/L — ABNORMAL HIGH (ref 14–54)
AST: 90 U/L — ABNORMAL HIGH (ref 15–41)
Albumin: 2.7 g/dL — ABNORMAL LOW (ref 3.5–5.0)
Alkaline Phosphatase: 368 U/L — ABNORMAL HIGH (ref 38–126)
Bilirubin, Direct: 1.6 mg/dL — ABNORMAL HIGH (ref 0.1–0.5)
Indirect Bilirubin: 1.4 mg/dL — ABNORMAL HIGH (ref 0.3–0.9)
Total Bilirubin: 3 mg/dL — ABNORMAL HIGH (ref 0.3–1.2)
Total Protein: 6.2 g/dL — ABNORMAL LOW (ref 6.5–8.1)

## 2016-06-10 LAB — GLUCOSE, CAPILLARY
GLUCOSE-CAPILLARY: 134 mg/dL — AB (ref 65–99)
Glucose-Capillary: 106 mg/dL — ABNORMAL HIGH (ref 65–99)
Glucose-Capillary: 107 mg/dL — ABNORMAL HIGH (ref 65–99)
Glucose-Capillary: 124 mg/dL — ABNORMAL HIGH (ref 65–99)
Glucose-Capillary: 135 mg/dL — ABNORMAL HIGH (ref 65–99)
Glucose-Capillary: 145 mg/dL — ABNORMAL HIGH (ref 65–99)
Glucose-Capillary: 95 mg/dL (ref 65–99)

## 2016-06-10 LAB — CBC
HCT: 26.3 % — ABNORMAL LOW (ref 36.0–46.0)
HCT: 26 % — ABNORMAL LOW (ref 36.0–46.0)
Hemoglobin: 8.5 g/dL — ABNORMAL LOW (ref 12.0–15.0)
Hemoglobin: 8.5 g/dL — ABNORMAL LOW (ref 12.0–15.0)
MCH: 27.4 pg (ref 26.0–34.0)
MCH: 27.6 pg (ref 26.0–34.0)
MCHC: 32.3 g/dL (ref 30.0–36.0)
MCHC: 32.7 g/dL (ref 30.0–36.0)
MCV: 84.8 fL (ref 78.0–100.0)
MCV: 84.4 fL (ref 78.0–100.0)
Platelets: 128 10*3/uL — ABNORMAL LOW (ref 150–400)
Platelets: 143 10*3/uL — ABNORMAL LOW (ref 150–400)
RBC: 3.1 MIL/uL — ABNORMAL LOW (ref 3.87–5.11)
RBC: 3.08 MIL/uL — ABNORMAL LOW (ref 3.87–5.11)
RDW: 14.9 % (ref 11.5–15.5)
RDW: 14.9 % (ref 11.5–15.5)
WBC: 17.6 10*3/uL — ABNORMAL HIGH (ref 4.0–10.5)
WBC: 19.1 10*3/uL — ABNORMAL HIGH (ref 4.0–10.5)

## 2016-06-10 LAB — BASIC METABOLIC PANEL
Anion gap: 7 (ref 5–15)
BUN: 14 mg/dL (ref 6–20)
CO2: 24 mmol/L (ref 22–32)
Calcium: 8 mg/dL — ABNORMAL LOW (ref 8.9–10.3)
Chloride: 102 mmol/L (ref 101–111)
Creatinine, Ser: 1.06 mg/dL — ABNORMAL HIGH (ref 0.44–1.00)
GFR calc Af Amer: >60 mL/min (ref >60)
GFR calc non Af Amer: 57 mL/min — ABNORMAL LOW (ref >60)
Glucose, Bld: 144 mg/dL — ABNORMAL HIGH (ref 65–99)
Potassium: 4.2 mmol/L (ref 3.5–5.1)
Sodium: 133 mmol/L — ABNORMAL LOW (ref 135–145)

## 2016-06-10 LAB — TSH: TSH: 2.779 u[IU]/mL (ref 0.350–4.500)

## 2016-06-10 LAB — DIGOXIN LEVEL: Digoxin Level: <0.2 ng/mL — ABNORMAL LOW (ref 0.8–2.0)

## 2016-06-10 LAB — MAGNESIUM: Magnesium: 2.9 mg/dL — ABNORMAL HIGH (ref 1.7–2.4)

## 2016-06-10 LAB — LACTIC ACID, PLASMA: Lactic Acid, Venous: 1.5 mmol/L (ref 0.5–1.9)

## 2016-06-10 MED ORDER — AMIODARONE LOAD VIA INFUSION
150.0000 mg | Freq: Once | INTRAVENOUS | Status: AC
  Administered 2016-06-10: 150 mg via INTRAVENOUS
  Filled 2016-06-10: qty 83.34

## 2016-06-10 MED ORDER — FUROSEMIDE 10 MG/ML IJ SOLN
80.0000 mg | Freq: Once | INTRAMUSCULAR | Status: AC
  Administered 2016-06-10: 80 mg via INTRAVENOUS
  Filled 2016-06-10: qty 8

## 2016-06-10 MED ORDER — FUROSEMIDE 10 MG/ML IJ SOLN
40.0000 mg | Freq: Two times a day (BID) | INTRAMUSCULAR | Status: DC
  Administered 2016-06-11 (×2): 40 mg via INTRAVENOUS
  Filled 2016-06-10 (×2): qty 4

## 2016-06-10 MED ORDER — SODIUM CHLORIDE 0.9% FLUSH
10.0000 mL | INTRAVENOUS | Status: DC | PRN
  Administered 2016-06-16: 20 mL
  Filled 2016-06-10: qty 40

## 2016-06-10 MED ORDER — SODIUM CHLORIDE 0.9% FLUSH
10.0000 mL | Freq: Two times a day (BID) | INTRAVENOUS | Status: DC
  Administered 2016-06-10 – 2016-06-15 (×9): 10 mL

## 2016-06-10 MED ORDER — FOLIC ACID 1 MG PO TABS
1.0000 mg | ORAL_TABLET | Freq: Every day | ORAL | Status: DC
  Administered 2016-06-10 – 2016-06-19 (×10): 1 mg via ORAL
  Filled 2016-06-10 (×10): qty 1

## 2016-06-10 MED ORDER — AMIODARONE HCL IN DEXTROSE 360-4.14 MG/200ML-% IV SOLN
60.0000 mg/h | INTRAVENOUS | Status: AC
  Administered 2016-06-10: 60 mg/h via INTRAVENOUS
  Filled 2016-06-10 (×2): qty 200

## 2016-06-10 MED ORDER — FUROSEMIDE 10 MG/ML IJ SOLN
40.0000 mg | Freq: Two times a day (BID) | INTRAMUSCULAR | Status: DC
  Administered 2016-06-10: 40 mg via INTRAVENOUS
  Filled 2016-06-10: qty 4

## 2016-06-10 MED ORDER — AMIODARONE HCL IN DEXTROSE 360-4.14 MG/200ML-% IV SOLN
30.0000 mg/h | INTRAVENOUS | Status: DC
  Administered 2016-06-11: 30 mg/h via INTRAVENOUS
  Filled 2016-06-10 (×2): qty 200

## 2016-06-10 MED ORDER — VITAMIN B-1 100 MG PO TABS
100.0000 mg | ORAL_TABLET | Freq: Every day | ORAL | Status: DC
  Administered 2016-06-10 – 2016-06-19 (×10): 100 mg via ORAL
  Filled 2016-06-10 (×10): qty 1

## 2016-06-10 NOTE — Progress Notes (Signed)
Patient ID: Elizabeth [name], female   DOB: 03-22-1956, 60 y.o.   MRN: [id number] TCTS DAILY ICU PROGRESS NOTE                   [address]            [place] [address]          [phone]   2 Days Post-Op Procedure(s) (LRB): CORONARY ARTERY BYPASS GRAFTING (CABG) X 4  With Right Greater Saphenous vein endoscopic harvesting (N/A) TRANSESOPHAGEAL ECHOCARDIOGRAM (TEE) (N/A)  Total Length of Stay:  LOS: 7 days   Subjective: Up in chair , not wheezing this am  Objective: Vital signs in last 24 hours: Temp:  [97.8 F (36.6 C)-98.6 F (37 C)] 97.9 F (36.6 C) (07/26 0735) Pulse Rate:  [67-92] 89 (07/26 0600) Cardiac Rhythm: Normal sinus rhythm (07/26 0740) Resp:  [15-29] 22 (07/26 0600) BP: (79-109)/(61-88) 103/70 (07/26 0600) SpO2:  [86 %-97 %] 95 % (07/26 0600) Arterial Line BP: (96-134)/(45-74) 110/58 (07/26 0600) Weight:  [217 lb 13 oz (98.8 kg)] 217 lb 13 oz (98.8 kg) (07/26 0600)  Filed Weights   06/08/16 0519 06/09/16 0500 06/10/16 0600  Weight: 201 lb 3.2 oz (91.3 kg) 198 lb 3.1 oz (89.9 kg) 217 lb 13 oz (98.8 kg)    Weight change: 19 lb 9.9 oz (8.9 kg)   Hemodynamic parameters for last 24 hours: PAP: (57)/(31) 57/31 CO:  [4.2 L/min] 4.2 L/min CI:  [2.2 L/min/m2] 2.2 L/min/m2  Intake/Output from previous day: 07/25 0701 - 07/26 0700 In: 1163 [I.V.:1063; IV Piggyback:100] Out: 960 [Urine:670; Chest Tube:290]  Intake/Output this shift: No intake/output data recorded.  Current Meds: Scheduled Meds: . antiseptic oral rinse  7 mL Mouth Rinse BID  . aspirin EC  325 mg Oral Daily   Or  . aspirin  324 mg Per Tube Daily  . bisacodyl  10 mg Oral Daily   Or  . bisacodyl  10 mg Rectal Daily  . cefUROXime (ZINACEF)  IV  1.5 g Intravenous Q12H  . docusate sodium  200 mg Oral Daily  . enoxaparin (LOVENOX) injection  30 mg Subcutaneous Q24H  . fluticasone  2 spray Each Nare Daily  . insulin aspart  0-24 Units Subcutaneous Q4H  . metoprolol  tartrate  12.5 mg Oral BID   Or  . metoprolol tartrate  12.5 mg Per Tube BID  . pantoprazole  40 mg Oral Daily  . sertraline  50 mg Oral Daily  . sodium chloride flush  3 mL Intravenous Q12H   Continuous Infusions: . sodium chloride Stopped (06/09/16 0930)  . sodium chloride    . sodium chloride Stopped (06/09/16 0900)  . dexmedetomidine Stopped (06/08/16 1800)  . lactated ringers Stopped (06/08/16 2000)  . lactated ringers 20 mL/hr at 06/09/16 1800  . phenylephrine (NEO-SYNEPHRINE) Adult infusion Stopped (06/10/16 0100)   PRN Meds:.sodium chloride, lactated ringers, levalbuterol, metoprolol, midazolam, morphine injection, ondansetron (ZOFRAN) IV, oxyCODONE, sodium chloride flush, traMADol  General appearance: alert, cooperative and no distress Neurologic: intact Heart: regular rate and rhythm, S1, S2 normal, no murmur, click, rub or gallop Lungs: diminished breath sounds bibasilar Abdomen: soft, non-tender; bowel sounds normal; no masses,  no organomegaly Extremities: extremities normal, atraumatic, no cyanosis or edema and Homans sign is negative, no sign of DVT Wound: sternum stable  Lab Results: CBC: Recent Labs  06/09/16 1710 06/09/16 1723 06/10/16 0353  WBC 16.0*  --  17.6*  HGB 8.9* 9.9* 8.5*  HCT  27.3* 29.0* 26.3*  PLT 151  --  128*   BMET:  Recent Labs  06/09/16 0257  06/09/16 1723 06/10/16 0353  NA 133*  --  133* 132*  K 4.0  --  4.3 4.2  CL 107  --  103 103  CO2 21*  --   --  22  GLUCOSE 126*  --  126* 113*  BUN <5*  --  5* 8  CREATININE 0.77  < > 0.80 0.86  CALCIUM 7.6*  --   --  8.4*  < > = values in this interval not displayed.  CMET: Lab Results  Component Value Date   WBC 17.6 (H) 06/10/2016   HGB 8.5 (L) 06/10/2016   HCT 26.3 (L) 06/10/2016   PLT 128 (L) 06/10/2016   GLUCOSE 113 (H) 06/10/2016   CHOL 218 (H) 05/26/2016   TRIG 102 05/26/2016   HDL 49 05/26/2016   LDLDIRECT 145.2 10/29/2011   LDLCALC 149 (H) 05/26/2016   ALT 78 (H)  06/10/2016   AST 88 (H) 06/10/2016   NA 132 (L) 06/10/2016   K 4.2 06/10/2016   CL 103 06/10/2016   CREATININE 0.86 06/10/2016   BUN 8 06/10/2016   CO2 22 06/10/2016   TSH 1.708 03/06/2016   INR 1.39 06/08/2016   HGBA1C 6.2 (H) 06/07/2016    PT/INR:  Recent Labs  06/08/16 1419  LABPROT 17.1*  INR 1.39   Radiology: Dg Chest Port 1 View  Result Date: 06/10/2016 CLINICAL DATA:  Chest pain. Status post coronary artery bypass grafting EXAM: PORTABLE CHEST 1 VIEW COMPARISON:  June 09, 2016 FINDINGS: Swan-Ganz catheter has been removed. Cordis tip is in the superior vena cava. There is a chest tube on the left. No pneumothorax. There is interstitial edema with pleural effusions bilaterally and cardiomegaly. There is pulmonary venous hypertension. There is patchy atelectasis in the left base. IMPRESSION: Tube and catheter positions as described without pneumothorax. Evidence a degree of congestive heart failure. Atelectasis left base. Stable cardiac silhouette. Electronically Signed   By: Lowella Grip III M.D.   On: 06/10/2016 07:13    Assessment/Plan: S/P Procedure(s) (LRB): CORONARY ARTERY BYPASS GRAFTING (CABG) X 4  With Right Greater Saphenous vein endoscopic harvesting (N/A) TRANSESOPHAGEAL ECHOCARDIOGRAM (TEE) (N/A) Mobilize Diuresis Diabetes control d/c tubes/lines LFT's improving,  Avoiding high dose satin currently, previous US and ct of abdomen "fatty Liver"-  Place pic line to remove central line   [name] 06/10/2016 7:57 AM

## 2016-06-10 NOTE — Progress Notes (Signed)
Ambulated patient in hallway 2 assist with wheelchair. Patient ambulated 100 ft on 6L of O2 stopping half way to rest. Patient was back in bed and went in to Afib RVR rate 130-150, PRN metoprolol given rate decreased to 110 briefly, called MD for orders, MD gave verbal order for [place] order set.  Rowe Pavy, RN

## 2016-06-10 NOTE — Progress Notes (Signed)
Patient converted at 1140, SR in 70's.   Rowe Pavy, RN

## 2016-06-10 NOTE — Progress Notes (Signed)
  Amiodarone Drug - Drug Interaction Consult Note  Recommendations:  Amiodarone is metabolized by the cytochrome P450 system and therefore has the potential to cause many drug interactions. Amiodarone has an average plasma half-life of 50 days (range 20 to 100 days).   There is potential for drug interactions to occur several weeks or months after stopping treatment and the onset of drug interactions may be slow after initiating amiodarone.   _0  Statins: Increased risk of myopathy. Simvastatin- restrict dose to 60m daily. Other statins: counsel patients to report any muscle pain or weakness immediately. --Lipitor 860mday PTA  _1  Anticoagulants: Amiodarone can increase anticoagulant effect. Consider warfarin dose reduction. Patients should be monitored closely and the dose of anticoagulant altered accordingly, remembering that amiodarone levels take several weeks to stabilize.  --Eliquis PTA  _2  Antiepileptics: Amiodarone can increase plasma concentration of phenytoin, the dose should be reduced. Note that small changes in phenytoin dose can result in large changes in levels. Monitor patient and counsel on signs of toxicity.  _3  Beta blockers: increased risk of bradycardia, AV block and myocardial depression. Sotalol - avoid concomitant use.  _4   Calcium channel blockers (diltiazem and verapamil): increased risk of bradycardia, AV block and myocardial depression.  _5   Cyclosporine: Amiodarone increases levels of cyclosporine. Reduced dose of cyclosporine is recommended.  _6  Digoxin dose should be halved when amiodarone is started.  _7  Diuretics: increased risk of cardiotoxicity if hypokalemia occurs.  _8  Oral hypoglycemic agents (glyburide, glipizide, glimepiride): increased risk of hypoglycemia. Patient's glucose levels should be monitored closely when initiating amiodarone therapy.   _9  Drugs that prolong the QT interval:  Torsades de pointes risk may be increased with concurrent  use - avoid if possible.  Monitor QTc, also keep magnesium/potassium WNL if concurrent therapy can't be avoided.  --Zofran  Thank You,   CrBirnamwoodoAlford HighlandPharmD, BCFloydlinical Staff Pharmacist Pager [phone] GhazitNorthern Arizona Va Healthcare System7/26/2017 9:29 AM

## 2016-06-10 NOTE — Progress Notes (Signed)
Peripherally Inserted Central Catheter/Midline Placement  The IV Nurse has discussed with the patient and/or persons authorized to consent for the patient, the purpose of this procedure and the potential benefits and risks involved with this procedure.  The benefits include less needle sticks, lab draws from the catheter and patient may be discharged home with the catheter.  Risks include, but not limited to, infection, bleeding, blood clot (thrombus formation), and puncture of an artery; nerve damage and irregular heat beat.  Alternatives to this procedure were also discussed.  PICC/Midline Placement Documentation        Elizabeth [name] 06/10/2016, 6:00 PM

## 2016-06-10 NOTE — Progress Notes (Signed)
While assessing vein with ultrasound for PICC insertion pt became restless and anxious.  Breathing noted to be 30BPM on monitor.   Dr. Servando Snare in and ordered dose of Lasix for shortness of breath.   Pt kept repeating,  "I don't feel good".   "I can't breath too good".   I will come back later and attempt PICC insertion.  Primary RN in and aware of situation too.

## 2016-06-10 NOTE — Progress Notes (Signed)
Peripherally Inserted Central Catheter/Midline Placement  The IV Nurse has discussed with the patient and/or persons authorized to consent for the patient, the purpose of this procedure and the potential benefits and risks involved with this procedure.  The benefits include less needle sticks, lab draws from the catheter and patient may be discharged home with the catheter.  Risks include, but not limited to, infection, bleeding, blood clot (thrombus formation), and puncture of an artery; nerve damage and irregular heat beat.  Alternatives to this procedure were also discussed.  PICC/Midline Placement Documentation        Elizabeth [name] 06/10/2016, 2:58 PM

## 2016-06-10 NOTE — Progress Notes (Signed)
Patient Name: Elizabeth [name] Date of Encounter: 06/10/2016  Active Problems:   Dyslipidemia   Anxiety and depression   Essential hypertension   Coronary atherosclerosis   Ischemic cardiomyopathy   Chest pain   Paroxysmal atrial fibrillation (HCC)   CAD (coronary artery disease)   Pain in the chest   Esophageal reflux   Coronary artery disease involving native coronary artery of native heart without angina pectoris   Coronary artery disease   Length of Stay: 7  SUBJECTIVE  Recovering well after CABG yesterday. Extubated yesterday. Walked this morning and immediately afterwards converted to atrial fibrillation with RVR (previously NSR). Rate remained high after IV metoprolol and she is now on IV amiodarone, ventricular rate 100-110 at rest.  CURRENT MEDS . amiodarone  150 mg Intravenous Once  . antiseptic oral rinse  7 mL Mouth Rinse BID  . aspirin EC  325 mg Oral Daily   Or  . aspirin  324 mg Per Tube Daily  . bisacodyl  10 mg Oral Daily   Or  . bisacodyl  10 mg Rectal Daily  . cefUROXime (ZINACEF)  IV  1.5 g Intravenous Q12H  . docusate sodium  200 mg Oral Daily  . enoxaparin (LOVENOX) injection  30 mg Subcutaneous Q24H  . fluticasone  2 spray Each Nare Daily  . folic acid  1 mg Oral Daily  . furosemide  40 mg Intravenous BID  . insulin aspart  0-24 Units Subcutaneous Q4H  . metoprolol tartrate  12.5 mg Oral BID   Or  . metoprolol tartrate  12.5 mg Per Tube BID  . pantoprazole  40 mg Oral Daily  . sertraline  50 mg Oral Daily  . sodium chloride flush  3 mL Intravenous Q12H  . thiamine  100 mg Oral Daily    OBJECTIVE   Intake/Output Summary (Last 24 hours) at 06/10/16 0933 Last data filed at 06/10/16 0900  Gross per 24 hour  Intake           862.96 ml  Output              880 ml  Net           -17.04 ml   Filed Weights   06/08/16 0519 06/09/16 0500 06/10/16 0600  Weight: 91.3 kg (201 lb 3.2 oz) 89.9 kg (198 lb 3.1 oz) 98.8 kg (217 lb 13 oz)     PHYSICAL EXAM Vitals:   06/10/16 0900 06/10/16 0915 06/10/16 0925 06/10/16 0929  BP: (!) 87/56 (!) 123/99 (!) 55/32 (!) 87/58  Pulse:  (!) 32    Resp: 18 (!) _0 Temp:      TempSrc:      SpO2:  99%    Weight:      Height:       General: Alert, oriented x3, no distress Head: no evidence of trauma, PERRL, EOMI, no exophtalmos or lid lag, no myxedema, no xanthelasma; normal ears, nose and oropharynx Neck: normal jugular venous pulsations and no hepatojugular reflux; brisk carotid pulses without delay and no carotid bruits Chest: clear to auscultation, no signs of consolidation by percussion or palpation, normal fremitus, symmetrical and full respiratory excursions Cardiovascular: normal position and quality of the apical impulse, irregular rhythm, normal first and second heart sounds, no rubs or gallops, no murmur Abdomen: no tenderness or distention, no masses by palpation, no abnormal pulsatility or arterial bruits, normal bowel sounds, no hepatosplenomegaly Extremities: no clubbing, cyanosis or edema; 2+ radial, ulnar and  brachial pulses bilaterally; 2+ right femoral, posterior tibial and dorsalis pedis pulses; 2+ left femoral, posterior tibial and dorsalis pedis pulses; no subclavian or femoral bruits Neurological: grossly nonfocal  LABS  CBC  Recent Labs  06/09/16 1710 06/09/16 1723 06/10/16 0353  WBC 16.0*  --  17.6*  HGB 8.9* 9.9* 8.5*  HCT 27.3* 29.0* 26.3*  MCV 85.0  --  84.8  PLT 151  --  454*   Basic Metabolic Panel  Recent Labs  06/09/16 0257 06/09/16 1710 06/09/16 1723 06/10/16 0353  NA 133*  --  133* 132*  K 4.0  --  4.3 4.2  CL 107  --  103 103  CO2 21*  --   --  22  GLUCOSE 126*  --  126* 113*  BUN <5*  --  5* 8  CREATININE 0.77 0.88 0.80 0.86  CALCIUM 7.6*  --   --  8.4*  MG 3.3* 3.1*  --   --    Liver Function Tests  Recent Labs  06/09/16 0257 06/10/16 0353  AST 127* 88*  ALT 85* 78*  ALKPHOS 409* 372*  BILITOT 2.7* 2.6*  PROT  5.6* 6.2*  ALBUMIN 3.0* 2.7*   Hemoglobin A1C  Recent Labs  06/07/16 2040  HGBA1C 6.2*   Fasting Lipid Panel No results for input(s): CHOL, HDL, LDLCALC, TRIG, CHOLHDL, LDLDIRECT in the last 72 hours. Thyroid Function Tests No results for input(s): TSH, T4TOTAL, T3FREE, THYROIDAB in the last 72 hours.  Invalid input(s): [place]  Radiology Studies Imaging results have been reviewed and Dg Chest Port 1 View  Result Date: 06/10/2016 CLINICAL DATA:  Chest pain. Status post coronary artery bypass grafting EXAM: PORTABLE CHEST 1 VIEW COMPARISON:  June 09, 2016 FINDINGS: Swan-Ganz catheter has been removed. Cordis tip is in the superior vena cava. There is a chest tube on the left. No pneumothorax. There is interstitial edema with pleural effusions bilaterally and cardiomegaly. There is pulmonary venous hypertension. There is patchy atelectasis in the left base. IMPRESSION: Tube and catheter positions as described without pneumothorax. Evidence a degree of congestive heart failure. Atelectasis left base. Stable cardiac silhouette. Electronically Signed   By: Lowella Grip III M.D.   On: 06/10/2016 07:13  Dg Chest Port 1 View  Result Date: 06/09/2016 CLINICAL DATA:  Status post CABG yesterday EXAM: PORTABLE CHEST 1 VIEW COMPARISON:  Portable chest x-ray of June 08, 2016 FINDINGS: There has been interval extubation of the trachea and of the esophagus. The lungs are reasonably well expanded. The interstitial markings remain increased. The left chest tube tip projects over the lateral aspect of the left fourth rib. There is no pneumothorax. There is left lower lobe atelectasis with obscuration of the hemidiaphragm more conspicuous today.External pacemaker and defibrillator pads have been removed. The Swan-Ganz catheter tip projects in the distal main pulmonary artery trunk. The cardiac silhouette remains enlarged and the central pulmonary vascularity remains engorged but is more distinct. The  mediastinal drain is stable. IMPRESSION: Interval extubation. No pneumothorax. Reasonable aeration of both lungs but there is increased left lower lobe atelectasis. Small pleural effusions layering posteriorly. Persistent mild pulmonary interstitial edema. Electronically Signed   By: David  Martinique M.D.   On: 06/09/2016 07:31  Dg Chest Port 1 View  Result Date: 06/08/2016 CLINICAL DATA:  Status post CABG surgery. EXAM: PORTABLE CHEST 1 VIEW COMPARISON:  06/01/2016 FINDINGS: Since the prior study, CABG surgery has been performed. Standard lines and tubes are in place, including: An endotracheal tube with its  tip 3.3 cm above the Carina, a right internal jugular Swan-Ganz catheter with its tip in the right pulmonary artery, a nasal/orogastric tube passing well below the diaphragm into the stomach, a mediastinal tube and a left chest tube, the latter with its tip at the left apex. There is central vascular congestion and interstitial thickening consistent with mild pulmonary edema. No gross pneumothorax noted on this supine study. There is no mediastinal widening. IMPRESSION: 1. Status post CABG surgery. 2. Mild pulmonary edema, with this appearance accentuated by the supine technique. 3. No mediastinal widening.  No gross pneumothorax. 4. Support apparatus is well positioned. Electronically Signed   By: Lajean Manes M.D.   On: 06/08/2016 14:49   TELE AFib RVR  ECG NSR, inferior T wave inversion  ASSESSMENT AND PLAN  1. CAD s/p CABG - recovering well 2. AF with RVR - just started on IV amiodarone, had AF even before surgery. Plan to keep on amiodarone PO for 30 days even if she promptly returns to SR. Resume Eliquis after CT/pacemaker wires removed. 3. Chronic (predominantly diastolic) heart failure, mild LV dysfunction - EF 45-50% (preop, chronic), no overt CHF. Expected postop fluid excess (16-19 lb compared to preop weight) Will need diuretic once BP recovers. 4. Hypotension - BP lower after AF  onset, still on low dose phenylephrine IV. She was on carvedilol 12.5 mg BID and lisinopril 2.5 mg daily before their admission. 5. Hyperlipidemia - high dose statin 6. Elevated liver enzymes - following admission (normal LFTs on 7/11, minimally abnormal alk phos 7/14), but preceding surgery and predominantly cholestatic (alk phos 880 on 7/23, increased direct bili, transaminases less striking) and steadily improving. No gallstones on CT earlier this month, just fatty liver. Etiology unclear. Had a similar spike in alk phos and AST/ALT Sep 2014, when she received care for a knee fracture. Consider opiate-related cholestasis? Was it a consequence of restarting atorvastatin after phone call 7/14? Monitor carefully while on statin and amiodarone.   Sanda Klein, MD, [facility] [phone] office [phone] pager 06/10/2016 9:33 AM

## 2016-06-10 NOTE — Progress Notes (Signed)
[address]       [place] [address]             [phone]      Having PICC line placed currently  BP (!) 112/98   Pulse 74   Temp 97.7 F (36.5 C) (Oral)   Resp (!) 23   Ht _0  (1.575 m)   Wt 217 lb 13 oz (98.8 kg)   LMP 12/30/1994   SpO2 95%   BMI 39.84 kg/m  On 6L [place] 96% sat  Intake/Output Summary (Last 24 hours) at 06/10/16 1759 Last data filed at 06/10/16 1700  Gross per 24 hour  Intake           869.06 ml  Output             1115 ml  Net          -245.94 ml   ABG 7.45/29/58/21/-3  Creatinine up slightly to 1.06 Lactic acid WNL- 1.5  Neo off, remains on amiodarone  Remo Lipps C. Roxan Hockey, MD [facility] [phone]

## 2016-06-10 NOTE — Op Note (Signed)
NAME:  Elizabeth [name], Elizabeth [name]          ACCOUNT NO.:  [id number]  MEDICAL RECORD NO.:  [id number]  LOCATION:  [facility]                         FACILITY:  [facility]  PHYSICIAN:  Lanelle Bal, MD    DATE OF BIRTH:  1956/10/19  DATE OF PROCEDURE:  06/08/2016 DATE OF DISCHARGE:                              OPERATIVE REPORT   PREOPERATIVE DIAGNOSIS:  Coronary occlusive disease with unstable angina.  POSTOPERATIVE DIAGNOSIS:  Coronary occlusive disease with unstable angina.  SURGICAL PROCEDURE:  Coronary artery bypass grafting x4 with the left internal mammary to the left anterior descending coronary artery. Sequential reverse saphenous vein graft to the obtuse marginal and distal circumflex branches and reverse saphenous vein graft to the distal right coronary artery with right thigh endo vein harvesting of the right greater saphenous vein.  SURGEON:  Lanelle Bal, MD  FIRST ASSISTANT:  Lars Pinks, PA  BRIEF HISTORY:  The patient is a 60 year old female with known coronary occlusive disease having had stents placed in her circumflex coronary artery in the past.  Repeat cardiac catheterization was performed by Dr. Tamala Julian, which demonstrated high-grade proximal and mid LAD lesions.  The proximal circumflex and obtuse marginal and distal circumflex disease of greater than 80% and total occlusion of the right coronary artery. Overall, there was preserved systolic function though the patient did have evidence of significant left ventricular hypertrophy.  Risks and options of surgery were discussed with the patient in detail, and she was agreeable with proceeding and signed informed consent.  DESCRIPTION OF PROCEDURE:  With Swan-Ganz and arterial lines in place, the patient underwent general endotracheal anesthesia without incident. TEE probe was placed by Dr. Glennon Mac.  Skin of the chest and legs was prepped with Betadine and draped in usual sterile manner.  Appropriate time-out was  performed.  We proceeded with right thigh and calf endo vein harvesting.  Median sternotomy was performed.  Left internal mammary artery was dissected down as pedicle graft.  The distal artery was divided, had good free flow.  Pericardium was opened.  As noted, the patient had significant left ventricular hypertrophy.  She was systemically heparinized.  Ascending aorta was cannulated.  The right atrium was cannulated.  An aortic root vent cardioplegia needle was introduced into the ascending aorta.  The patient was placed on cardiopulmonary bypass 2.4 L/min/m2.  Sites of anastomosis were selected and dissected out of the epicardium.  The patient's body temperature was cooled to 32 degrees.  Aortic crossclamp was applied 500 mL cold blood potassium cardioplegia was administered with diastolic arrest of the heart.  The distal right coronary artery was identified and opened and admitted a 1.5-mm probe.  The quality of the very distal right coronary artery was very good and was with minimal calcification.  The heart was then elevated with the significance of her left ventricular hypertrophy. This was with some difficulty, but with the large obtuse marginal vessel was identified, opened, and admitted a 1.5-mm probe distally using a diamond-type side-to-side anastomosis was carried out with a running 7-0 Prolene.  The distal extent of same vein was carried short distance to a small distal circumflex branch, which was opened and was 1.2 mm in size. Using a running 7-0 Prolene, distal  anastomosis was performed.  We then turned our attention to the left anterior descending coronary artery between the mid and distal third.  The vessel was opened and was of good quality, 1.5-mm probe passed distally.  Using a running 8-0 Prolene, left internal mammary artery was anastomosed to the left anterior descending coronary artery.  With the cross-clamp still in place, 2 punch aortotomies were performed, and  each of the two vein grafts were anastomosed to the ascending aorta.  The bulldog was removed from the mammary artery with prompt rise in myocardial temperature.  The aortic root and vein grafts were de-aired, and the proximal anastomoses completed and aortic crossclamp was removed with total crossclamp time of 95 minutes.  Then, the patient spontaneously converted to a sinus rhythm.  Atrial and ventricular pacing wires were applied.  On low-dose milrinone, the patient was then ventilated and weaned from cardiopulmonary bypass without difficulty.  She remained hemodynamically stable, was decannulated in usual fashion.  Protamine sulfate was administered with operative field hemostatic.  Atrial and ventricular pacing wires were applied.  Graft marker was applied.  The left pleural tube and a Blake mediastinal drain were left in place.  Pericardium was loosely reapproximated.  Sternum was closed with #6 stainless steel wire.  Fascia closed with interrupted 0 Vicryl, running 3-0 Vicryl, and subcutaneous tissue, 3-0 subcuticular stitch in skin edges.  Dry dressings were applied.  Sponge and needle count was reported as correct at completion of the procedure.  The patient tolerated the procedure without obvious complication and was transferred to the Surgical Intensive Care Unit for further postoperative care.  The patient preoperatively had been on Eliquis for episode of paroxysmally atrial fibrillation, and the whole time the patient was in the hospital preoperatively, she had no atrial fibrillation, and the most we can discern from cardiology history was that she had only a single episode in the past, although the Maze procedure was considered, we decided with the patient's overall medical condition we would not pursue this.     Lanelle Bal, MD     EG/MEDQ  D:  06/09/2016  T:  06/09/2016  Job:  [id number]

## 2016-06-11 ENCOUNTER — Inpatient Hospital Stay (HOSPITAL_COMMUNITY): Payer: Medicare Other

## 2016-06-11 LAB — COMPREHENSIVE METABOLIC PANEL
ALT: 71 U/L — ABNORMAL HIGH (ref 14–54)
AST: 76 U/L — ABNORMAL HIGH (ref 15–41)
Albumin: 2.6 g/dL — ABNORMAL LOW (ref 3.5–5.0)
Alkaline Phosphatase: 324 U/L — ABNORMAL HIGH (ref 38–126)
Anion gap: 7 (ref 5–15)
BUN: 17 mg/dL (ref 6–20)
CO2: 23 mmol/L (ref 22–32)
Calcium: 8.1 mg/dL — ABNORMAL LOW (ref 8.9–10.3)
Chloride: 99 mmol/L — ABNORMAL LOW (ref 101–111)
Creatinine, Ser: 0.97 mg/dL (ref 0.44–1.00)
GFR calc Af Amer: >60 mL/min (ref >60)
GFR calc non Af Amer: >60 mL/min (ref >60)
Glucose, Bld: 113 mg/dL — ABNORMAL HIGH (ref 65–99)
Potassium: 4 mmol/L (ref 3.5–5.1)
Sodium: 129 mmol/L — ABNORMAL LOW (ref 135–145)
Total Bilirubin: 2.4 mg/dL — ABNORMAL HIGH (ref 0.3–1.2)
Total Protein: 6.5 g/dL (ref 6.5–8.1)

## 2016-06-11 LAB — GLUCOSE, CAPILLARY
GLUCOSE-CAPILLARY: 100 mg/dL — AB (ref 65–99)
GLUCOSE-CAPILLARY: 121 mg/dL — AB (ref 65–99)
Glucose-Capillary: 111 mg/dL — ABNORMAL HIGH (ref 65–99)
Glucose-Capillary: 99 mg/dL (ref 65–99)
Glucose-Capillary: 104 mg/dL — ABNORMAL HIGH (ref 65–99)
Glucose-Capillary: 109 mg/dL — ABNORMAL HIGH (ref 65–99)

## 2016-06-11 LAB — CBC
HCT: 23.7 % — ABNORMAL LOW (ref 36.0–46.0)
Hemoglobin: 7.8 g/dL — ABNORMAL LOW (ref 12.0–15.0)
MCH: 27.5 pg (ref 26.0–34.0)
MCHC: 32.9 g/dL (ref 30.0–36.0)
MCV: 83.5 fL (ref 78.0–100.0)
Platelets: 149 10*3/uL — ABNORMAL LOW (ref 150–400)
RBC: 2.84 MIL/uL — ABNORMAL LOW (ref 3.87–5.11)
RDW: 15 % (ref 11.5–15.5)
WBC: 19.2 10*3/uL — ABNORMAL HIGH (ref 4.0–10.5)

## 2016-06-11 LAB — TYPE AND SCREEN
ABO/RH(D): O POS
Antibody Screen: NEGATIVE
Unit division: 0
Unit division: 0

## 2016-06-11 LAB — MAGNESIUM: Magnesium: 2.6 mg/dL — ABNORMAL HIGH (ref 1.7–2.4)

## 2016-06-11 MED ORDER — AMIODARONE HCL 200 MG PO TABS
200.0000 mg | ORAL_TABLET | Freq: Two times a day (BID) | ORAL | Status: DC
  Administered 2016-06-11 – 2016-06-15 (×10): 200 mg via ORAL
  Filled 2016-06-11 (×10): qty 1

## 2016-06-11 MED ORDER — AMIODARONE HCL IN DEXTROSE 360-4.14 MG/200ML-% IV SOLN: 60.0000 mg/h | INTRAVENOUS | Status: DC

## 2016-06-11 MED ORDER — AMIODARONE HCL 200 MG PO TABS: 200.0000 mg | ORAL_TABLET | Freq: Two times a day (BID) | ORAL | Status: DC

## 2016-06-11 MED ORDER — FUROSEMIDE 10 MG/ML IJ SOLN: 40.0000 mg | Freq: Two times a day (BID) | INTRAMUSCULAR | Status: DC

## 2016-06-11 MED ORDER — AMIODARONE IV BOLUS ONLY 150 MG/100ML
150.0000 mg | INTRAVENOUS | Status: AC
  Administered 2016-06-11: 150 mg via INTRAVENOUS
  Filled 2016-06-11: qty 100

## 2016-06-11 MED ORDER — AMIODARONE HCL 200 MG PO TABS: 200.0000 mg | ORAL_TABLET | Freq: Every day | ORAL | Status: DC

## 2016-06-11 MED ORDER — LEVALBUTEROL HCL 0.63 MG/3ML IN NEBU
0.6300 mg | INHALATION_SOLUTION | Freq: Four times a day (QID) | RESPIRATORY_TRACT | Status: DC
  Administered 2016-06-11 – 2016-06-13 (×9): 0.63 mg via RESPIRATORY_TRACT
  Filled 2016-06-11 (×10): qty 3

## 2016-06-11 MED ORDER — AMIODARONE HCL IN DEXTROSE 360-4.14 MG/200ML-% IV SOLN: 30.0000 mg/h | INTRAVENOUS | Status: DC

## 2016-06-11 NOTE — Progress Notes (Signed)
Patient ID: Elizabeth [name], female   DOB: [birth year], 60 y.o.   MRN: [id number] TCTS DAILY ICU PROGRESS NOTE                   [address]            [place] [address]          [phone]   3 Days Post-Op Procedure(s) (LRB): CORONARY ARTERY BYPASS GRAFTING (CABG) X 4  With Right Greater Saphenous vein endoscopic harvesting (N/A) TRANSESOPHAGEAL ECHOCARDIOGRAM (TEE) (N/A)  Total Length of Stay:  LOS: 8 days   Subjective: Feels better this am, denies abdominal pain  Objective: Vital signs in last 24 hours: Temp:  [97 F (36.1 C)-97.9 F (36.6 C)] 97.7 F (36.5 C) (07/27 0814) Pulse Rate:  [25-105] 67 (07/27 0800) Cardiac Rhythm: Normal sinus rhythm (07/27 0732) Resp:  [16-33] 18 (07/27 0800) BP: (55-160)/(32-106) 130/81 (07/27 0800) SpO2:  [73 %-100 %] 97 % (07/27 0800) Weight:  [216 lb 4.3 oz (98.1 kg)] 216 lb 4.3 oz (98.1 kg) (07/27 0500)  Filed Weights   06/09/16 0500 06/10/16 0600 06/11/16 0500  Weight: 198 lb 3.1 oz (89.9 kg) 217 lb 13 oz (98.8 kg) 216 lb 4.3 oz (98.1 kg)    Weight change: -1 lb 8.7 oz (-0.7 kg)   Hemodynamic parameters for last 24 hours:    Intake/Output from previous day: 07/26 0701 - 07/27 0700 In: 965.6 [P.O.:370; I.V.:545.6; IV Piggyback:50] Out: 940 [Urine:940]  Intake/Output this shift: Total I/O In: 26.7 [I.V.:26.7] Out: -   Current Meds: Scheduled Meds: . aspirin EC  325 mg Oral Daily   Or  . aspirin  324 mg Per Tube Daily  . bisacodyl  10 mg Oral Daily   Or  . bisacodyl  10 mg Rectal Daily  . docusate sodium  200 mg Oral Daily  . enoxaparin (LOVENOX) injection  30 mg Subcutaneous Q24H  . fluticasone  2 spray Each Nare Daily  . folic acid  1 mg Oral Daily  . furosemide  40 mg Intravenous BID  . insulin aspart  0-24 Units Subcutaneous Q4H  . metoprolol tartrate  12.5 mg Oral BID   Or  . metoprolol tartrate  12.5 mg Per Tube BID  . pantoprazole  40 mg Oral Daily  . sertraline  50 mg Oral Daily  .  sodium chloride flush  10-40 mL Intracatheter Q12H  . sodium chloride flush  3 mL Intravenous Q12H  . thiamine  100 mg Oral Daily   Continuous Infusions: . sodium chloride Stopped (06/09/16 0930)  . sodium chloride    . sodium chloride Stopped (06/09/16 0900)  . amiodarone 30 mg/hr (06/11/16 0800)  . dexmedetomidine Stopped (06/08/16 1800)  . lactated ringers Stopped (06/08/16 2000)  . lactated ringers Stopped (06/10/16 0900)  . phenylephrine (NEO-SYNEPHRINE) Adult infusion Stopped (06/10/16 1400)   PRN Meds:.sodium chloride, lactated ringers, levalbuterol, metoprolol, midazolam, morphine injection, ondansetron (ZOFRAN) IV, oxyCODONE, sodium chloride flush, sodium chloride flush, traMADol  General appearance: alert, cooperative and no distress Neurologic: intact Heart: regular rate and rhythm, S1, S2 normal, no murmur, click, rub or gallop Lungs: diminished breath sounds bibasilar Abdomen: soft, non-tender; bowel sounds normal; no masses,  no organomegaly Extremities: extremities normal, atraumatic, no cyanosis or edema and Homans sign is negative, no sign of DVT Wound: stenum stable dressing intact  Lab Results: CBC: Recent Labs  06/10/16 1606 06/11/16 0320  WBC 19.1* 19.2*  HGB 8.5* 7.8*  HCT 26.0*  23.7*  PLT 143* 149*   BMET:  Recent Labs  06/10/16 1606 06/11/16 0320  NA 133* 129*  K 4.2 4.0  CL 102 99*  CO2 24 23  GLUCOSE 144* 113*  BUN 14 17  CREATININE 1.06* 0.97  CALCIUM 8.0* 8.1*    CMET: Lab Results  Component Value Date   WBC 19.2 (H) 06/11/2016   HGB 7.8 (L) 06/11/2016   HCT 23.7 (L) 06/11/2016   PLT 149 (L) 06/11/2016   GLUCOSE 113 (H) 06/11/2016   CHOL 218 (H) 05/26/2016   TRIG 102 05/26/2016   HDL 49 05/26/2016   LDLDIRECT 145.2 10/29/2011   LDLCALC 149 (H) 05/26/2016   ALT 71 (H) 06/11/2016   AST 76 (H) 06/11/2016   NA 129 (L) 06/11/2016   K 4.0 06/11/2016   CL 99 (L) 06/11/2016   CREATININE 0.97 06/11/2016   BUN 17 06/11/2016   CO2  23 06/11/2016   TSH 2.779 06/10/2016   INR 1.39 06/08/2016   HGBA1C 6.2 (H) 06/07/2016     PT/INR:  Recent Labs  06/08/16 1419  LABPROT 17.1*  INR 1.39   Radiology: Dg Chest Port 1 View  Result Date: 06/11/2016 CLINICAL DATA:  Shortness of breath. EXAM: PORTABLE CHEST 1 VIEW COMPARISON:  06/10/2016.  06/09/2016 . FINDINGS: Interval removal of left chest tube and right IJ sheath. Right PICC line in stable position. Prior CABG. Cardiomegaly with pulmonary vascular prominence and bilateral interstitial prominence and bilateral pleural effusions consistent with congestive heart failure . No pneumothorax . IMPRESSION: 1. Interim removal of left chest tube and right IJ sheath . Right PICC line in stable position. No pneumothorax. 2. Prior CABG. Cardiomegaly with bilateral pulmonary interstitial prominence and bilateral pleural effusions consistent with congestive heart failure again noted . Low lung volumes with basilar atelectasis. Electronically Signed   By: Marcello Moores  Register   On: 06/11/2016 07:33    Assessment/Plan: S/P Procedure(s) (LRB): CORONARY ARTERY BYPASS GRAFTING (CABG) X 4  With Right Greater Saphenous vein endoscopic harvesting (N/A) TRANSESOPHAGEAL ECHOCARDIOGRAM (TEE) (N/A) Mobilize Diuresis Diabetes control LFT's improved - holding statin for now  Renal function stable Slow to progress and mobilize    Elizabeth [name] 06/11/2016 8:16 AM

## 2016-06-11 NOTE — Progress Notes (Signed)
Patient Name: Elizabeth [name] Date of Encounter: 06/11/2016  Active Problems:   Dyslipidemia   Anxiety and depression   Essential hypertension   Coronary atherosclerosis   Ischemic cardiomyopathy   Chest pain   Paroxysmal atrial fibrillation (HCC)   CAD (coronary artery disease)   Pain in the chest   Esophageal reflux   Coronary artery disease involving native coronary artery of native heart without angina pectoris   Coronary artery disease   Length of Stay: 8  SUBJECTIVE  Feels well. Pain controlled,.  CURRENT MEDS . amiodarone  200 mg Oral Q12H   Followed by  . [START ON 06/18/2016] amiodarone  200 mg Oral Daily  . aspirin EC  325 mg Oral Daily   Or  . aspirin  324 mg Per Tube Daily  . bisacodyl  10 mg Oral Daily   Or  . bisacodyl  10 mg Rectal Daily  . docusate sodium  200 mg Oral Daily  . enoxaparin (LOVENOX) injection  30 mg Subcutaneous Q24H  . fluticasone  2 spray Each Nare Daily  . folic acid  1 mg Oral Daily  . furosemide  40 mg Intravenous BID  . insulin aspart  0-24 Units Subcutaneous Q4H  . metoprolol tartrate  12.5 mg Oral BID   Or  . metoprolol tartrate  12.5 mg Per Tube BID  . pantoprazole  40 mg Oral Daily  . sertraline  50 mg Oral Daily  . sodium chloride flush  10-40 mL Intracatheter Q12H  . sodium chloride flush  3 mL Intravenous Q12H  . thiamine  100 mg Oral Daily    OBJECTIVE   Intake/Output Summary (Last 24 hours) at 06/11/16 1206 Last data filed at 06/11/16 1200  Gross per 24 hour  Intake             1131 ml  Output             1062 ml  Net               69 ml   Filed Weights   06/09/16 0500 06/10/16 0600 06/11/16 0500  Weight: 89.9 kg (198 lb 3.1 oz) 98.8 kg (217 lb 13 oz) 98.1 kg (216 lb 4.3 oz)    PHYSICAL EXAM Vitals:   06/11/16 0814 06/11/16 0900 06/11/16 1000 06/11/16 1100  BP:  96/85 115/82 115/78  Pulse:   90 65  Resp:  (!) 26 (!) 25 19  Temp: 97.7 F (36.5 C)     TempSrc: Oral     SpO2:   92% 96%  Weight:       Height:       General: Alert, oriented x3, no distress Head: no evidence of trauma, PERRL, EOMI, no exophtalmos or lid lag, no myxedema, no xanthelasma; normal ears, nose and oropharynx Neck: normal jugular venous pulsations and no hepatojugular reflux; brisk carotid pulses without delay and no carotid bruits Chest: clear to auscultation, no signs of consolidation by percussion or palpation, normal fremitus, symmetrical and full respiratory excursions Cardiovascular: normal position and quality of the apical impulse, regular rhythm, normal first and second heart sounds, no rubs or gallops, no murmur Abdomen: no tenderness or distention, no masses by palpation, no abnormal pulsatility or arterial bruits, normal bowel sounds, no hepatosplenomegaly Extremities: no clubbing, cyanosis or edema; 2+ radial, ulnar and brachial pulses bilaterally; 2+ right femoral, posterior tibial and dorsalis pedis pulses; 2+ left femoral, posterior tibial and dorsalis pedis pulses; no subclavian or femoral bruits Neurological: grossly  nonfocal  LABS  CBC  Recent Labs  06/10/16 1606 06/11/16 0320  WBC 19.1* 19.2*  HGB 8.5* 7.8*  HCT 26.0* 23.7*  MCV 84.4 83.5  PLT 143* 027*   Basic Metabolic Panel  Recent Labs  06/10/16 0920 06/10/16 1606 06/11/16 0320  NA  --  133* 129*  K  --  4.2 4.0  CL  --  102 99*  CO2  --  24 23  GLUCOSE  --  144* 113*  BUN  --  14 17  CREATININE  --  1.06* 0.97  CALCIUM  --  8.0* 8.1*  MG 2.9*  --  2.6*   Liver Function Tests  Recent Labs  06/10/16 0920 06/11/16 0320  AST 90* 76*  ALT 79* 71*  ALKPHOS 368* 324*  BILITOT 3.0* 2.4*  PROT 6.2* 6.5  ALBUMIN 2.7* 2.6*   Thyroid Function Tests  Recent Labs  06/10/16 0920  TSH 2.779    Radiology Studies Imaging results have been reviewed and Dg Chest Port 1 View  Result Date: 06/11/2016 CLINICAL DATA:  Shortness of breath. EXAM: PORTABLE CHEST 1 VIEW COMPARISON:  06/10/2016.  06/09/2016 . FINDINGS:  Interval removal of left chest tube and right IJ sheath. Right PICC line in stable position. Prior CABG. Cardiomegaly with pulmonary vascular prominence and bilateral interstitial prominence and bilateral pleural effusions consistent with congestive heart failure . No pneumothorax . IMPRESSION: 1. Interim removal of left chest tube and right IJ sheath . Right PICC line in stable position. No pneumothorax. 2. Prior CABG. Cardiomegaly with bilateral pulmonary interstitial prominence and bilateral pleural effusions consistent with congestive heart failure again noted . Low lung volumes with basilar atelectasis. Electronically Signed   By: Marcello Moores  Register   On: 06/11/2016 07:33  Dg Chest Port 1 View  Result Date: 06/10/2016 CLINICAL DATA:  Chest pain. Status post coronary artery bypass grafting EXAM: PORTABLE CHEST 1 VIEW COMPARISON:  June 09, 2016 FINDINGS: Swan-Ganz catheter has been removed. Cordis tip is in the superior vena cava. There is a chest tube on the left. No pneumothorax. There is interstitial edema with pleural effusions bilaterally and cardiomegaly. There is pulmonary venous hypertension. There is patchy atelectasis in the left base. IMPRESSION: Tube and catheter positions as described without pneumothorax. Evidence a degree of congestive heart failure. Atelectasis left base. Stable cardiac silhouette. Electronically Signed   By: Lowella Grip III M.D.   On: 06/10/2016 07:13   TELE Afib with controlled rate   ASSESSMENT AND PLAN  1. CAD s/p CABG - recovering well 2. AF with RVR - back in NSR since about midnight, but odds of recurrence are high. She had AF even before surgery. Would finish a 24 hour amio "load". Plan to keep on amiodarone PO for 30 days postop, unless LFTs deteriorate. Resume Eliquis after CT/pacemaker wires removed. 3. Chronic (predominantly diastolic) heart failure, mild LV dysfunction - EF 45-50% (preop, chronic), no overt CHF. Expected postop fluid excess (16-19  lb compared to preop weight) Will need diuretic once BP recovers. 4. Hypotension - Improved, off vasopressors. She was on carvedilol 12.5 mg BID and lisinopril 2.5 mg daily before their admission. Introduce these gradually as BP increases. 5. Hyperlipidemia - high dose statin 6. Elevated liver enzymes - slowly improving. The abnormality started following admission (normal LFTs on 7/11, minimally abnormal alk phos 7/14), but preceding surgery and predominantly cholestatic (alk phos 880 on 7/23, increased direct bili, transaminases less striking) and steadily improving. No gallstones on CT earlier this  month, just fatty liver. Etiology unclear. Had a similar spike in alk phos and AST/ALT Sep 2014, when she received care for a knee fracture. Consider opiate-related cholestasis? Was it a consequence of restarting atorvastatin after phone call 7/14? Monitor carefully while on statin and/or amiodarone. It may be best to hold off statin until we stop the amiodarone.   Sanda Klein, MD, [facility] [phone] office [phone] pager 06/11/2016 12:06 PM

## 2016-06-11 NOTE — Progress Notes (Signed)
Patient went into AFIB RVR 130s-140s, BP stable, Per Dr. Cyndia Bent will PO AMIO and LOPRESSOR now and AMIO BOLUS 140m IV now.  Patient otherwise stable

## 2016-06-11 NOTE — Progress Notes (Signed)
   06/11/16 0959  Clinical Encounter Type  Visited With Patient and family together  Visit Type Spiritual support  Consult/Referral To Chaplain  Spiritual Encounters  Spiritual Needs Prayer  Stress Factors  Patient Stress Factors Health changes  Family Stress Factors Health changes  Chaplain rounding visit made, provided spiritual presence, emotional support and prayer. Advised that Chaplain services are available 24/7 as needed.

## 2016-06-11 NOTE — Care Management Note (Signed)
Case Management Note  Patient Details  Name: Elizabeth [name] MRN: [id number] Date of Birth: 05-11-1956  Subjective/Objective:   Pt lives with sister and nieces, states she will have assistance 24/7 when discharged.                                 Expected Discharge Plan:  [place]  Discharge planning Services  CM Consult  Status of Service:  In process, will continue to follow

## 2016-06-11 NOTE — Progress Notes (Signed)
Patient ID: Elizabeth [name], female   DOB: [birth year], 60 y.o.   MRN: [id number]  SICU Evening Rounds:  Hemodynamically stable Went back into A-fib with RVR this pm and giving a bolus of amio in addition to her po dose.  Urine output ok  Bowels moved today.

## 2016-06-11 NOTE — Care Management Important Message (Signed)
Important Message  Patient Details  Name: Elizabeth [name] MRN: [id number] Date of Birth: 09-Jan-1956   Medicare Important Message Given:  Yes    Nathen May 06/11/2016, 12:06 PM

## 2016-06-11 NOTE — Progress Notes (Signed)
Patient refusing to walk this AM. Educated patient on importance of mobility and patient still refused. Patient stated that she would ambulate later that she was too tired.  Levon Hedger RN

## 2016-06-12 ENCOUNTER — Inpatient Hospital Stay (HOSPITAL_COMMUNITY): Payer: Medicare Other

## 2016-06-12 LAB — COMPREHENSIVE METABOLIC PANEL
ALT: 65 U/L — ABNORMAL HIGH (ref 14–54)
AST: 65 U/L — ABNORMAL HIGH (ref 15–41)
Albumin: 2.7 g/dL — ABNORMAL LOW (ref 3.5–5.0)
Alkaline Phosphatase: 316 U/L — ABNORMAL HIGH (ref 38–126)
Anion gap: 11 (ref 5–15)
BUN: 18 mg/dL (ref 6–20)
CO2: 26 mmol/L (ref 22–32)
Calcium: 8.6 mg/dL — ABNORMAL LOW (ref 8.9–10.3)
Chloride: 95 mmol/L — ABNORMAL LOW (ref 101–111)
Creatinine, Ser: 0.87 mg/dL (ref 0.44–1.00)
GFR calc Af Amer: >60 mL/min (ref >60)
GFR calc non Af Amer: >60 mL/min (ref >60)
Glucose, Bld: 109 mg/dL — ABNORMAL HIGH (ref 65–99)
Potassium: 3.9 mmol/L (ref 3.5–5.1)
Sodium: 132 mmol/L — ABNORMAL LOW (ref 135–145)
Total Bilirubin: 2 mg/dL — ABNORMAL HIGH (ref 0.3–1.2)
Total Protein: 6.7 g/dL (ref 6.5–8.1)

## 2016-06-12 LAB — CBC
HCT: 22.9 % — ABNORMAL LOW (ref 36.0–46.0)
Hemoglobin: 7.5 g/dL — ABNORMAL LOW (ref 12.0–15.0)
MCH: 27.3 pg (ref 26.0–34.0)
MCHC: 32.8 g/dL (ref 30.0–36.0)
MCV: 83.3 fL (ref 78.0–100.0)
Platelets: 187 10*3/uL (ref 150–400)
RBC: 2.75 MIL/uL — ABNORMAL LOW (ref 3.87–5.11)
RDW: 15.3 % (ref 11.5–15.5)
WBC: 19.4 10*3/uL — ABNORMAL HIGH (ref 4.0–10.5)

## 2016-06-12 LAB — GLUCOSE, CAPILLARY
GLUCOSE-CAPILLARY: 121 mg/dL — AB (ref 65–99)
GLUCOSE-CAPILLARY: 115 mg/dL — AB (ref 65–99)
Glucose-Capillary: 96 mg/dL (ref 65–99)
Glucose-Capillary: 106 mg/dL — ABNORMAL HIGH (ref 65–99)
Glucose-Capillary: 109 mg/dL — ABNORMAL HIGH (ref 65–99)

## 2016-06-12 LAB — URINE MICROSCOPIC-ADD ON

## 2016-06-12 LAB — URINALYSIS, ROUTINE W REFLEX MICROSCOPIC
Bilirubin Urine: NEGATIVE
Glucose, UA: NEGATIVE mg/dL
Ketones, ur: NEGATIVE mg/dL
Nitrite: NEGATIVE
Protein, ur: NEGATIVE mg/dL
Specific Gravity, Urine: 1.014 (ref 1.005–1.030)
pH: 5 (ref 5.0–8.0)

## 2016-06-12 LAB — PREPARE RBC (CROSSMATCH)

## 2016-06-12 MED ORDER — ASPIRIN EC 81 MG PO TBEC
81.0000 mg | DELAYED_RELEASE_TABLET | Freq: Every day | ORAL | Status: DC
  Administered 2016-06-12 – 2016-06-15 (×4): 81 mg via ORAL
  Filled 2016-06-12 (×4): qty 1

## 2016-06-12 MED ORDER — AMIODARONE LOAD VIA INFUSION
150.0000 mg | Freq: Once | INTRAVENOUS | Status: AC
  Administered 2016-06-12: 150 mg via INTRAVENOUS

## 2016-06-12 MED ORDER — FUROSEMIDE 10 MG/ML IJ SOLN
80.0000 mg | Freq: Two times a day (BID) | INTRAMUSCULAR | Status: DC
  Administered 2016-06-12 – 2016-06-14 (×5): 80 mg via INTRAVENOUS
  Filled 2016-06-12 (×6): qty 8

## 2016-06-12 MED ORDER — SODIUM CHLORIDE 0.9 % IV SOLN: Freq: Once | INTRAVENOUS | Status: DC

## 2016-06-12 MED ORDER — DIGOXIN 125 MCG PO TABS
0.1250 mg | ORAL_TABLET | Freq: Every day | ORAL | Status: DC
  Administered 2016-06-12 – 2016-06-17 (×6): 0.125 mg via ORAL
  Filled 2016-06-12 (×6): qty 1

## 2016-06-12 MED ORDER — ASPIRIN 81 MG PO CHEW
324.0000 mg | CHEWABLE_TABLET | Freq: Every day | ORAL | Status: DC
  Filled 2016-06-12: qty 4

## 2016-06-12 MED ORDER — METOPROLOL TARTRATE 25 MG PO TABS
25.0000 mg | ORAL_TABLET | Freq: Three times a day (TID) | ORAL | Status: DC
  Administered 2016-06-12 – 2016-06-13 (×3): 25 mg via ORAL
  Filled 2016-06-12 (×3): qty 1

## 2016-06-12 MED ORDER — METOPROLOL TARTRATE 25 MG/10 ML ORAL SUSPENSION
12.5000 mg | Freq: Three times a day (TID) | ORAL | Status: DC
Start: 2016-06-12 — End: 2016-06-13

## 2016-06-12 NOTE — Progress Notes (Signed)
CT surgery p.m. Rounds  Patient received transfusion today for expected postoperative anemia Patient continues to have atrial fibrillation with increased heart rate with exertion. On Lovenox 30 mg daily Patient remains on nasal cannula oxygen for adequate saturation Continue current care

## 2016-06-12 NOTE — Progress Notes (Addendum)
Patient HR in the 140s while getting up to bedside commode, patient did get short of breath and was anxious.  After getting back to bed, patient was given 10oxy IR for chest soreness and patient's HR was back in the 90-100s, patient's shortness of breath improved as well.  Patient educated on the importance of using sternal precautions and when moving/ambulating to breath and not hold her breath.  Patient educated on the importance to using IS and FV for pulmonary support, will monitor

## 2016-06-12 NOTE — Progress Notes (Signed)
Patient Name: Elizabeth [name] Date of Encounter: 06/12/2016  Active Problems:   Dyslipidemia   Anxiety and depression   Essential hypertension   Coronary atherosclerosis   Ischemic cardiomyopathy   Chest pain   Paroxysmal atrial fibrillation (HCC)   CAD (coronary artery disease)   Pain in the chest   Esophageal reflux   Coronary artery disease involving native coronary artery of native heart without angina pectoris   Coronary artery disease   Length of Stay: 9  SUBJECTIVE  Back in AF with RVR since about 2000h. A little slower after 2 boluses of amiodarone, but still fast.  CURRENT MEDS . sodium chloride   Intravenous Once  . amiodarone  200 mg Oral Q12H   Followed by  . [START ON 06/18/2016] amiodarone  200 mg Oral Daily  . aspirin EC  81 mg Oral Daily   Or  . aspirin  324 mg Per Tube Daily  . bisacodyl  10 mg Oral Daily   Or  . bisacodyl  10 mg Rectal Daily  . docusate sodium  200 mg Oral Daily  . enoxaparin (LOVENOX) injection  30 mg Subcutaneous Q24H  . fluticasone  2 spray Each Nare Daily  . folic acid  1 mg Oral Daily  . furosemide  80 mg Intravenous BID  . insulin aspart  0-24 Units Subcutaneous Q4H  . levalbuterol  0.63 mg Nebulization Q6H  . metoprolol tartrate  12.5 mg Oral BID   Or  . metoprolol tartrate  12.5 mg Per Tube BID  . pantoprazole  40 mg Oral Daily  . sertraline  50 mg Oral Daily  . sodium chloride flush  10-40 mL Intracatheter Q12H  . sodium chloride flush  3 mL Intravenous Q12H  . thiamine  100 mg Oral Daily    OBJECTIVE   Intake/Output Summary (Last 24 hours) at 06/12/16 1441 Last data filed at 06/12/16 1415  Gross per 24 hour  Intake           922.05 ml  Output             1725 ml  Net          -802.95 ml   Filed Weights   06/10/16 0600 06/11/16 0500 06/12/16 0401  Weight: 98.8 kg (217 lb 13 oz) 98.1 kg (216 lb 4.3 oz) 96.9 kg (213 lb 10 oz)    PHYSICAL EXAM Vitals:   06/12/16 1300 06/12/16 1338 06/12/16 1400 06/12/16  1415  BP:   107/88 107/86  Pulse: (!) 133  (!) 122   Resp:   (!) 22   Temp:    97.8 F (36.6 C)  TempSrc:    Oral  SpO2:  97% 98%   Weight:      Height:       General: Alert, oriented x3, no distress Head: no evidence of trauma, PERRL, EOMI, no exophtalmos or lid lag, no myxedema, no xanthelasma; normal ears, nose and oropharynx Neck: normal jugular venous pulsations and no hepatojugular reflux; brisk carotid pulses without delay and no carotid bruits Chest: clear to auscultation, no signs of consolidation by percussion or palpation, normal fremitus, symmetrical and full respiratory excursions Cardiovascular: normal position and quality of the apical impulse, irregular rhythm, normal first and second heart sounds, no rubs or gallops, no murmur Abdomen: no tenderness or distention, no masses by palpation, no abnormal pulsatility or arterial bruits, normal bowel sounds, no hepatosplenomegaly Extremities: no clubbing, cyanosis or edema; 2+ radial, ulnar and brachial pulses bilaterally;   2+ right femoral, posterior tibial and dorsalis pedis pulses; 2+ left femoral, posterior tibial and dorsalis pedis pulses; no subclavian or femoral bruits Neurological: grossly nonfocal  LABS  CBC  Recent Labs  06/11/16 0320 06/12/16 0520  WBC 19.2* 19.4*  HGB 7.8* 7.5*  HCT 23.7* 22.9*  MCV 83.5 83.3  PLT 149* 800   Basic Metabolic Panel  Recent Labs  06/10/16 0920  06/11/16 0320 06/12/16 0520  NA  --   < > 129* 132*  K  --   < > 4.0 3.9  CL  --   < > 99* 95*  CO2  --   < > 23 26  GLUCOSE  --   < > 113* 109*  BUN  --   < > 17 18  CREATININE  --   < > 0.97 0.87  CALCIUM  --   < > 8.1* 8.6*  MG 2.9*  --  2.6*  --   < > = values in this interval not displayed. Liver Function Tests  Recent Labs  06/11/16 0320 06/12/16 0520  AST 76* 65*  ALT 71* 65*  ALKPHOS 324* 316*  BILITOT 2.4* 2.0*  PROT 6.5 6.7  ALBUMIN 2.6* 2.7*   Thyroid Function Tests  Recent Labs  06/10/16 0920   TSH 2.779    Radiology Studies Imaging results have been reviewed and Dg Chest Port 1 View  Result Date: 06/12/2016 CLINICAL DATA:  PICC line repositioned. EXAM: PORTABLE CHEST 1 VIEW COMPARISON:  06/12/2016 FINDINGS: Right PICC line tip now at the cavoatrial junction. Cardiomegaly. Prior CABG. Vascular congestion with bilateral lower lobe opacities and effusions, unchanged. IMPRESSION: Right PICC line tip at the cavoatrial junction. Otherwise no change. Electronically Signed   By: Rolm Baptise M.D.   On: 06/12/2016 13:36  Dg Chest Port 1 View  Result Date: 06/12/2016 CLINICAL DATA:  PICC placement EXAM: PORTABLE CHEST 1 VIEW COMPARISON:  06/12/2016 FINDINGS: Right PICC line again crosses into the left innominate vein, in stable position since prior study. Cardiomegaly. Prior CABG. Bilateral lower lobe opacities and effusions are unchanged. IMPRESSION: Right PICC line continues to course into the left innominate vein, unchanged. Cardiomegaly with bilateral lower lobe opacities and layering effusions. Electronically Signed   By: Rolm Baptise M.D.   On: 06/12/2016 12:34  Dg Chest Port 1 View  Result Date: 06/12/2016 CLINICAL DATA:  Shortness of breath. EXAM: PORTABLE CHEST 1 VIEW COMPARISON:  Radiograph of June 11, 2016. FINDINGS: Stable cardiomegaly. Status post coronary artery bypass graft. No pneumothorax is noted. Right-sided PICC line is partially withdrawn, with tip now directed in retrograde manner into left brachiocephalic vein. Stable bibasilar opacities are noted concerning for atelectasis or edema with associated pleural effusions. Bony thorax is unremarkable. IMPRESSION: Stable bibasilar opacities concerning for edema or atelectasis with associated pleural effusions. Right-sided PICC line is partially withdrawn, with tip now directed in retrograde manner into left brachiocephalic vein. Electronically Signed   By: Marijo Conception, M.D.   On: 06/12/2016 07:26  Dg Chest Port 1 View  Result  Date: 06/11/2016 CLINICAL DATA:  Shortness of breath. EXAM: PORTABLE CHEST 1 VIEW COMPARISON:  06/10/2016.  06/09/2016 . FINDINGS: Interval removal of left chest tube and right IJ sheath. Right PICC line in stable position. Prior CABG. Cardiomegaly with pulmonary vascular prominence and bilateral interstitial prominence and bilateral pleural effusions consistent with congestive heart failure . No pneumothorax . IMPRESSION: 1. Interim removal of left chest tube and right IJ sheath . Right PICC line  in stable position. No pneumothorax. 2. Prior CABG. Cardiomegaly with bilateral pulmonary interstitial prominence and bilateral pleural effusions consistent with congestive heart failure again noted . Low lung volumes with basilar atelectasis. Electronically Signed   By: Kolenovic  Register   On: 06/11/2016 07:33   TELE AFib w RVR   ASSESSMENT AND PLAN  1. CAD s/p CABG- recovering well 2. AF with RVR- back in AFib and rate is not well controlled. Will increase beta blocker dose and if still RVR may need to restart IV amiodarone. Plan to keep on amiodarone PO for 30 days postop, unless LFTs deteriorate. Resume Eliquis when OK with Dr. Gerhardt. 3. Chronic (predominantly diastolic) heart failure, mild LV dysfunction - EF 45-50% (preop, chronic), no overt CHF. Expected postop fluid excess (lost 3 lb since yesterday, still roughly 10 lb ahead compared to preop weight). 4. Hypotension- Improved, off vasopressors. She was on carvedilol 12.5 mg BID and lisinopril 2.5 mg daily before their admission. Introduce these gradually as BP increases. 5. Hyperlipidemia - off statin due to abnormal LFTs. 6. Elevated liver enzymes - slowly improving. The abnormality started following admission (normal LFTs on 7/11, minimally abnormal alk phos 7/14), but preceding surgery and predominantly cholestatic (alk phos 880 on 7/23, increased direct bili, transaminases less striking) and steadily improving. No gallstones on CT earlier  this month, just fatty liver. Etiology unclear. Had a similar spike in alk phos and AST/ALT Sep 2014, when she received care for a knee fracture. Consider opiate-related cholestasis? Was it a consequence of restarting atorvastatin after phone call 7/14? Monitor carefully while on amiodarone. Hold off statin until we stop the amiodarone.   Mihai Croitoru, MD, FACC [facility] [phone] office [phone] pager 06/12/2016 2:41 PM  

## 2016-06-12 NOTE — Progress Notes (Signed)
Upon review of last chest x-ray. Noted that PICC line is sitting in CAJ now. Called and notified Thayer Headings RN pts unit nurse. [place] for nurse to use line at this time. Fran Lowes, RN [facility]

## 2016-06-12 NOTE — Progress Notes (Signed)
Patient ID: Elizabeth [name], female   DOB: 1956-06-21, 60 y.o.   MRN: [id number] T[facility] DAILY ICU PROGRESS NOTE                   [address]            [place] [address]          [phone]   4 Days Post-Op Procedure(s) (LRB): CORONARY ARTERY BYPASS GRAFTING (CABG) X 4  With Right Greater Saphenous vein endoscopic harvesting (N/A) TRANSESOPHAGEAL ECHOCARDIOGRAM (TEE) (N/A)  Total Length of Stay:  LOS: 9 days   Subjective: Back in rapid afib this am, rate 130 , still sob with exersion, on 6 liters o2  Objective: Vital signs in last 24 hours: Temp:  [97.4 F (36.3 C)-97.9 F (36.6 C)] 97.4 F (36.3 C) (07/28 0401) Pulse Rate:  [65-134] 104 (07/28 0600) Cardiac Rhythm: Atrial fibrillation (07/28 0600) Resp:  [13-27] 15 (07/28 0600) BP: (96-150)/(74-105) 107/88 (07/28 0600) SpO2:  [92 %-100 %] 95 % (07/28 0728) FiO2 (%):  [40 %] 40 % (07/28 0300) Weight:  [213 lb 10 oz (96.9 kg)] 213 lb 10 oz (96.9 kg) (07/28 0401)  Filed Weights   06/10/16 0600 06/11/16 0500 06/12/16 0401  Weight: 217 lb 13 oz (98.8 kg) 216 lb 4.3 oz (98.1 kg) 213 lb 10 oz (96.9 kg)    Weight change: -2 lb 10.3 oz (-1.2 kg)   Hemodynamic parameters for last 24 hours:    Intake/Output from previous day: 07/27 0701 - 07/28 0700 In: 1178.8 [P.O.:960; I.V.:218.8] Out: 952 [Urine:950; Stool:2]  Intake/Output this shift: No intake/output data recorded.  Current Meds: Scheduled Meds: . amiodarone  200 mg Oral Q12H   Followed by  . [START ON 06/18/2016] amiodarone  200 mg Oral Daily  . aspirin EC  325 mg Oral Daily   Or  . aspirin  324 mg Per Tube Daily  . bisacodyl  10 mg Oral Daily   Or  . bisacodyl  10 mg Rectal Daily  . docusate sodium  200 mg Oral Daily  . enoxaparin (LOVENOX) injection  30 mg Subcutaneous Q24H  . fluticasone  2 spray Each Nare Daily  . folic acid  1 mg Oral Daily  . furosemide  40 mg Intravenous BID  . insulin aspart  0-24 Units Subcutaneous Q4H  .  levalbuterol  0.63 mg Nebulization Q6H  . metoprolol tartrate  12.5 mg Oral BID   Or  . metoprolol tartrate  12.5 mg Per Tube BID  . pantoprazole  40 mg Oral Daily  . sertraline  50 mg Oral Daily  . sodium chloride flush  10-40 mL Intracatheter Q12H  . sodium chloride flush  3 mL Intravenous Q12H  . thiamine  100 mg Oral Daily   Continuous Infusions: . sodium chloride Stopped (06/09/16 0930)  . sodium chloride Stopped (06/11/16 0904)  . sodium chloride Stopped (06/09/16 0900)  . amiodarone 30 mg/hr (06/11/16 0800)  . lactated ringers Stopped (06/08/16 2000)  . lactated ringers Stopped (06/10/16 0900)   PRN Meds:.sodium chloride, lactated ringers, metoprolol, morphine injection, ondansetron (ZOFRAN) IV, oxyCODONE, sodium chloride flush, sodium chloride flush, traMADol  General appearance: alert, cooperative and mild distress Neurologic: intact Heart: irregularly irregular rhythm Lungs: diminished breath sounds bibasilar Abdomen: soft, non-tender; bowel sounds normal; no masses,  no organomegaly Extremities: extremities normal, atraumatic, no cyanosis or edema and Homans sign is negative, no sign of DVT Wound: sternum stable  Lab Results: CBC: Recent Labs  06/11/16 0320 06/12/16 0520  WBC 19.2* 19.4*  HGB 7.8* 7.5*  HCT 23.7* 22.9*  PLT 149* 187   BMET:  Recent Labs  06/11/16 0320 06/12/16 0520  NA 129* 132*  K 4.0 3.9  CL 99* 95*  CO2 23 26  GLUCOSE 113* 109*  BUN 17 18  CREATININE 0.97 0.87  CALCIUM 8.1* 8.6*    CMET: Lab Results  Component Value Date   WBC 19.4 (H) 06/12/2016   HGB 7.5 (L) 06/12/2016   HCT 22.9 (L) 06/12/2016   PLT 187 06/12/2016   GLUCOSE 109 (H) 06/12/2016   CHOL 218 (H) 05/26/2016   TRIG 102 05/26/2016   HDL 49 05/26/2016   LDLDIRECT 145.2 10/29/2011   LDLCALC 149 (H) 05/26/2016   ALT 65 (H) 06/12/2016   AST 65 (H) 06/12/2016   NA 132 (L) 06/12/2016   K 3.9 06/12/2016   CL 95 (L) 06/12/2016   CREATININE 0.87 06/12/2016   BUN  18 06/12/2016   CO2 26 06/12/2016   TSH 2.779 06/10/2016   INR 1.39 06/08/2016   HGBA1C 6.2 (H) 06/07/2016   Total bili 2.0  PT/INR: No results for input(s): LABPROT, INR in the last 72 hours. Radiology: Dg Chest Port 1 View  Result Date: 06/12/2016 CLINICAL DATA:  Shortness of breath. EXAM: PORTABLE CHEST 1 VIEW COMPARISON:  Radiograph of June 11, 2016. FINDINGS: Stable cardiomegaly. Status post coronary artery bypass graft. No pneumothorax is noted. Right-sided PICC line is partially withdrawn, with tip now directed in retrograde manner into left brachiocephalic vein. Stable bibasilar opacities are noted concerning for atelectasis or edema with associated pleural effusions. Bony thorax is unremarkable. IMPRESSION: Stable bibasilar opacities concerning for edema or atelectasis with associated pleural effusions. Right-sided PICC line is partially withdrawn, with tip now directed in retrograde manner into left brachiocephalic vein. Electronically Signed   By: Marijo Conception, M.D.   On: 06/12/2016 07:26    Assessment/Plan: S/P Procedure(s) (LRB): CORONARY ARTERY BYPASS GRAFTING (CABG) X 4  With Right Greater Saphenous vein endoscopic harvesting (N/A) TRANSESOPHAGEAL ECHOCARDIOGRAM (TEE) (N/A) Mobilize Diuresis Diabetes control Continued treatment for rapid afib, will need to go back on anticoagulation- was prescribed eliquis pre op but was not consistently taking  Wbc elevated , no fever, will check sputum and urine cultures cholestatic jaundiced improving  Bili now 2.0 , lfts improving  Will transfuse one unit of PRBS with hgb 7.4   [name] 06/12/2016 7:53 AM

## 2016-06-12 NOTE — Progress Notes (Signed)
[facility] RN's Zienna Ahlin and Karie Kirks worked as team. Pt in bed sitting up. Pulled PICC line back 1cm, then flushing each lumen with 64m at a time, (for a total of 359m simultaneously. Then 1054mlushed with good blood return noted. Redressed PICC with sterile drsg per protocol. Notified JanSheppard Plumber reorder chest x-ray for placement verification. HeaFran LowesN VAST

## 2016-06-13 ENCOUNTER — Inpatient Hospital Stay (HOSPITAL_COMMUNITY): Payer: Medicare Other

## 2016-06-13 LAB — COMPREHENSIVE METABOLIC PANEL
ALT: 58 U/L — ABNORMAL HIGH (ref 14–54)
AST: 58 U/L — ABNORMAL HIGH (ref 15–41)
Albumin: 2.6 g/dL — ABNORMAL LOW (ref 3.5–5.0)
Alkaline Phosphatase: 318 U/L — ABNORMAL HIGH (ref 38–126)
Anion gap: 9 (ref 5–15)
BUN: 23 mg/dL — ABNORMAL HIGH (ref 6–20)
CO2: 26 mmol/L (ref 22–32)
Calcium: 8.2 mg/dL — ABNORMAL LOW (ref 8.9–10.3)
Chloride: 95 mmol/L — ABNORMAL LOW (ref 101–111)
Creatinine, Ser: 0.84 mg/dL (ref 0.44–1.00)
GFR calc Af Amer: >60 mL/min (ref >60)
GFR calc non Af Amer: >60 mL/min (ref >60)
Glucose, Bld: 98 mg/dL (ref 65–99)
Potassium: 3.7 mmol/L (ref 3.5–5.1)
Sodium: 130 mmol/L — ABNORMAL LOW (ref 135–145)
Total Bilirubin: 2.6 mg/dL — ABNORMAL HIGH (ref 0.3–1.2)
Total Protein: 6.9 g/dL (ref 6.5–8.1)

## 2016-06-13 LAB — GLUCOSE, CAPILLARY
GLUCOSE-CAPILLARY: 108 mg/dL — AB (ref 65–99)
GLUCOSE-CAPILLARY: 90 mg/dL (ref 65–99)
GLUCOSE-CAPILLARY: 131 mg/dL — AB (ref 65–99)
GLUCOSE-CAPILLARY: 122 mg/dL — AB (ref 65–99)
Glucose-Capillary: 86 mg/dL (ref 65–99)
Glucose-Capillary: 109 mg/dL — ABNORMAL HIGH (ref 65–99)

## 2016-06-13 LAB — CBC
HCT: 23.9 % — ABNORMAL LOW (ref 36.0–46.0)
Hemoglobin: 8.1 g/dL — ABNORMAL LOW (ref 12.0–15.0)
MCH: 28.2 pg (ref 26.0–34.0)
MCHC: 33.9 g/dL (ref 30.0–36.0)
MCV: 83.3 fL (ref 78.0–100.0)
Platelets: 215 10*3/uL (ref 150–400)
RBC: 2.87 MIL/uL — ABNORMAL LOW (ref 3.87–5.11)
RDW: 15 % (ref 11.5–15.5)
WBC: 17.7 10*3/uL — ABNORMAL HIGH (ref 4.0–10.5)

## 2016-06-13 LAB — TYPE AND SCREEN
ABO/RH(D): O POS
Antibody Screen: NEGATIVE
Unit division: 0

## 2016-06-13 LAB — URINE CULTURE

## 2016-06-13 LAB — PROTIME-INR
INR: 1.37
Prothrombin Time: 17 seconds — ABNORMAL HIGH (ref 11.4–15.2)

## 2016-06-13 MED ORDER — POTASSIUM CHLORIDE 10 MEQ/50ML IV SOLN
10.0000 meq | INTRAVENOUS | Status: AC
  Administered 2016-06-13 (×3): 10 meq via INTRAVENOUS
  Filled 2016-06-13 (×3): qty 50

## 2016-06-13 MED ORDER — LEVALBUTEROL HCL 0.63 MG/3ML IN NEBU
0.6300 mg | INHALATION_SOLUTION | Freq: Three times a day (TID) | RESPIRATORY_TRACT | Status: DC
  Administered 2016-06-14 – 2016-06-16 (×7): 0.63 mg via RESPIRATORY_TRACT
  Filled 2016-06-13 (×9): qty 3

## 2016-06-13 MED ORDER — CARVEDILOL 12.5 MG PO TABS
12.5000 mg | ORAL_TABLET | Freq: Two times a day (BID) | ORAL | Status: DC
  Administered 2016-06-13 – 2016-06-19 (×11): 12.5 mg via ORAL
  Filled 2016-06-13 (×12): qty 1

## 2016-06-13 NOTE — Progress Notes (Signed)
Patient Name: Elizabeth [name] Date of Encounter: 06/13/2016  Active Problems:   Dyslipidemia   Anxiety and depression   Essential hypertension   Coronary atherosclerosis   Ischemic cardiomyopathy   Chest pain   Paroxysmal atrial fibrillation (HCC)   CAD (coronary artery disease)   Pain in the chest   Esophageal reflux   Coronary artery disease involving native coronary artery of native heart without angina pectoris   Coronary artery disease   Length of Stay: 10  SUBJECTIVE  Feels generally well. Received transfusion. Back in NSR since early AM.  CURRENT MEDS . sodium chloride   Intravenous Once  . amiodarone  200 mg Oral Q12H   Followed by  . [START ON 06/18/2016] amiodarone  200 mg Oral Daily  . aspirin EC  81 mg Oral Daily   Or  . aspirin  324 mg Per Tube Daily  . bisacodyl  10 mg Oral Daily   Or  . bisacodyl  10 mg Rectal Daily  . digoxin  0.125 mg Oral Daily  . docusate sodium  200 mg Oral Daily  . enoxaparin (LOVENOX) injection  30 mg Subcutaneous Q24H  . fluticasone  2 spray Each Nare Daily  . folic acid  1 mg Oral Daily  . furosemide  80 mg Intravenous BID  . insulin aspart  0-24 Units Subcutaneous Q4H  . levalbuterol  0.63 mg Nebulization Q6H  . metoprolol tartrate  25 mg Oral TID   Or  . metoprolol tartrate  12.5 mg Per Tube TID  . pantoprazole  40 mg Oral Daily  . sertraline  50 mg Oral Daily  . sodium chloride flush  10-40 mL Intracatheter Q12H  . sodium chloride flush  3 mL Intravenous Q12H  . thiamine  100 mg Oral Daily    OBJECTIVE   Intake/Output Summary (Last 24 hours) at 06/13/16 1113 Last data filed at 06/13/16 1000  Gross per 24 hour  Intake          1127.33 ml  Output             1475 ml  Net          -347.67 ml   Filed Weights   06/11/16 0500 06/12/16 0401 06/13/16 0500  Weight: 98.1 kg (216 lb 4.3 oz) 96.9 kg (213 lb 10 oz) 97.3 kg (214 lb 8.1 oz)    PHYSICAL EXAM Vitals:   06/13/16 0722 06/13/16 0800 06/13/16 0900  06/13/16 1000  BP:  128/81 (!) 146/98 (!) 146/95  Pulse:  69 70 78  Resp:  12 14 (!) 21  Temp:  97.7 F (36.5 C)    TempSrc:  Oral    SpO2: 99% 99% 97% 96%  Weight:      Height:       General: Alert, oriented x3, no distress Head: no evidence of trauma, PERRL, EOMI, no exophtalmos or lid lag, no myxedema, no xanthelasma; normal ears, nose and oropharynx Neck: normal jugular venous pulsations and no hepatojugular reflux; brisk carotid pulses without delay and no carotid bruits Chest:very diminished breath sounds in bases Cardiovascular: normal position and quality of the apical impulse, regular rhythm, normal first and second heart sounds, no rubs or gallops, no murmur Abdomen: no tenderness or distention, no masses by palpation, no abnormal pulsatility or arterial bruits, normal bowel sounds, no hepatosplenomegaly Extremities: no clubbing, cyanosis or edema; 2+ radial, ulnar and brachial pulses bilaterally; 2+ right femoral, posterior tibial and dorsalis pedis pulses; 2+ left femoral, posterior tibial  and dorsalis pedis pulses; no subclavian or femoral bruits Neurological: grossly nonfocal  LABS  CBC  Recent Labs  06/12/16 0520 06/13/16 0325  WBC 19.4* 17.7*  HGB 7.5* 8.1*  HCT 22.9* 23.9*  MCV 83.3 83.3  PLT 187 979   Basic Metabolic Panel  Recent Labs  06/11/16 0320 06/12/16 0520 06/13/16 0325  NA 129* 132* 130*  K 4.0 3.9 3.7  CL 99* 95* 95*  CO2 _0 GLUCOSE 113* 109* 98  BUN 17 18 23*  CREATININE 0.97 0.87 0.84  CALCIUM 8.1* 8.6* 8.2*  MG 2.6*  --   --    Liver Function Tests  Recent Labs  06/12/16 0520 06/13/16 0325  AST 65* 58*  ALT 65* 58*  ALKPHOS 316* 318*  BILITOT 2.0* 2.6*  PROT 6.7 6.9  ALBUMIN 2.7* 2.6*   No results for input(s): LIPASE, AMYLASE in the last 72 hours. Cardiac Enzymes No results for input(s): CKTOTAL, CKMB, CKMBINDEX, TROPONINI in the last 72 hours. BNP Invalid input(s): POCBNP D-Dimer No results for input(s):  DDIMER in the last 72 hours. Hemoglobin A1C No results for input(s): HGBA1C in the last 72 hours. Fasting Lipid Panel No results for input(s): CHOL, HDL, LDLCALC, TRIG, CHOLHDL, LDLDIRECT in the last 72 hours. Thyroid Function Tests No results for input(s): TSH, T4TOTAL, T3FREE, THYROIDAB in the last 72 hours.  Invalid input(s): [place]  Radiology Studies Imaging results have been reviewed and Dg Chest Port 1 View  Result Date: 06/13/2016 CLINICAL DATA:  CABG. EXAM: PORTABLE CHEST 1 VIEW COMPARISON:  06/12/2016 FINDINGS: Right PICC line remains in place, unchanged. Cardiomegaly. Bilateral airspace opacities are again noted, likely edema. Layering bilateral effusions with bibasilar atelectasis. IMPRESSION: Moderate CHF. Layering bilateral effusions with bibasilar atelectasis. No real change. Electronically Signed   By: Rolm Baptise M.D.   On: 06/13/2016 07:54  Dg Chest Port 1 View  Result Date: 06/12/2016 CLINICAL DATA:  PICC line repositioned. EXAM: PORTABLE CHEST 1 VIEW COMPARISON:  06/12/2016 FINDINGS: Right PICC line tip now at the cavoatrial junction. Cardiomegaly. Prior CABG. Vascular congestion with bilateral lower lobe opacities and effusions, unchanged. IMPRESSION: Right PICC line tip at the cavoatrial junction. Otherwise no change. Electronically Signed   By: Rolm Baptise M.D.   On: 06/12/2016 13:36  Dg Chest Port 1 View  Result Date: 06/12/2016 CLINICAL DATA:  PICC placement EXAM: PORTABLE CHEST 1 VIEW COMPARISON:  06/12/2016 FINDINGS: Right PICC line again crosses into the left innominate vein, in stable position since prior study. Cardiomegaly. Prior CABG. Bilateral lower lobe opacities and effusions are unchanged. IMPRESSION: Right PICC line continues to course into the left innominate vein, unchanged. Cardiomegaly with bilateral lower lobe opacities and layering effusions. Electronically Signed   By: Rolm Baptise M.D.   On: 06/12/2016 12:34  Dg Chest Port 1 View  Result Date:  06/12/2016 CLINICAL DATA:  Shortness of breath. EXAM: PORTABLE CHEST 1 VIEW COMPARISON:  Radiograph of June 11, 2016. FINDINGS: Stable cardiomegaly. Status post coronary artery bypass graft. No pneumothorax is noted. Right-sided PICC line is partially withdrawn, with tip now directed in retrograde manner into left brachiocephalic vein. Stable bibasilar opacities are noted concerning for atelectasis or edema with associated pleural effusions. Bony thorax is unremarkable. IMPRESSION: Stable bibasilar opacities concerning for edema or atelectasis with associated pleural effusions. Right-sided PICC line is partially withdrawn, with tip now directed in retrograde manner into left brachiocephalic vein. Electronically Signed   By: Marijo Conception, M.D.   On: 06/12/2016 07:26  TELE NSR since 4 AM   ASSESSMENT AND PLAN  1. CAD s/p CABG- recovering well 2. AF with RVR- back inNSR and rate is not well controlled. Plan to keep on amiodarone PO for 30 days postop, unless LFTs deteriorate.Resume Eliquis when OK with CV surgery. 3. Chronic (predominantly diastolic) heart failure, mild LV dysfunction - EF 45-50% (preop, chronic), no overt CHF. Expected postop fluid excess (still roughly 10 lb ahead compared to preop weight). Will need diuretics. 4. Hypotension- Improved, off vasopressors. Restart carvedilol 12.5 mg BID. Plan to resume lisinopril 2.5 mg daily before discharge if BP still allows. 5. Hyperlipidemia - off statin due to abnormal LFTs. 6. Elevated liver enzymes - slowly improving. The abnormality started following admission (normal LFTs on 7/11, minimally abnormal alk phos 7/14), but preceding surgery and predominantly cholestatic (alk phos 880 on 7/23, increased direct bili, transaminases less striking) and steadily improving. No gallstones on CT earlier this month, just fatty liver. Etiology unclear. Had a similar spike in alk phos and AST/ALT Sep 2014, when she received care for a knee fracture.  Consider opiate-related cholestasis? Was it a consequence of restarting atorvastatin after phone call 7/14? Monitor carefully while onamiodarone. Hold off statin until we stop the amiodarone.   Sanda Klein, MD, [facility] [phone] office [phone] pager 06/13/2016 11:13 AM

## 2016-06-13 NOTE — Progress Notes (Signed)
Duplicate/error

## 2016-06-13 NOTE — Progress Notes (Addendum)
5 Days Post-Op Procedure(s) (LRB): CORONARY ARTERY BYPASS GRAFTING (CABG) X 4  With Right Greater Saphenous vein endoscopic harvesting (N/A) TRANSESOPHAGEAL ECHOCARDIOGRAM (TEE) (N/A) Subjective: Patient doing better today-currently in sinus rhythm She plans on ambulating twice in the hallway Urine output excellent, oxygen requirement minimal Slight serosanguineous drainage from lower sternal incision-Betadine and dry 4 x 4 dressing applied. Stable  Objective: Vital signs in last 24 hours: Temp:  [97.4 F (36.3 C)-97.9 F (36.6 C)] 97.7 F (36.5 C) (07/29 0800) Pulse Rate:  [34-133] 82 (07/29 1100) Cardiac Rhythm: Normal sinus rhythm (07/29 1100) Resp:  [12-28] 20 (07/29 1100) BP: (105-146)/(74-107) 137/77 (07/29 1100) SpO2:  [93 %-100 %] 94 % (07/29 1100) Weight:  [214 lb 8.1 oz (97.3 kg)] 214 lb 8.1 oz (97.3 kg) (07/29 0500)  Hemodynamic parameters for last 24 hours:    Intake/Output from previous day: 07/28 0701 - 07/29 0700 In: 1217.3 [P.O.:740; I.V.:48.3; Blood:279; IV Piggyback:150] Out: 6438 [Urine:1450] Intake/Output this shift: Total I/O In: 160 [P.O.:120; I.V.:40] Out: 1100 [Urine:1100]  Obese resting comfortably breathing well and recliner Abdomen soft Sternum stable Extremities with minimal edema Neuro intact   Lab Results:  Recent Labs  06/12/16 0520 06/13/16 0325  WBC 19.4* 17.7*  HGB 7.5* 8.1*  HCT 22.9* 23.9*  PLT 187 215   BMET:  Recent Labs  06/12/16 0520 06/13/16 0325  NA 132* 130*  K 3.9 3.7  CL 95* 95*  CO2 26 26  GLUCOSE 109* 98  BUN 18 23*  CREATININE 0.87 0.84  CALCIUM 8.6* 8.2*    PT/INR:  Recent Labs  06/13/16 0325  LABPROT 17.0*  INR 1.37   ABG    Component Value Date/Time   PHART 7.447 06/10/2016 1625   HCO3 20.5 06/10/2016 1625   TCO2 21 06/10/2016 1625   ACIDBASEDEF 3.0 (H) 06/10/2016 1625   O2SAT 92.0 06/10/2016 1625   CBG (last 3)   Recent Labs  06/12/16 2323 06/13/16 0315 06/13/16 0855  GLUCAP  108* 90 86    Assessment/Plan: S/P Procedure(s) (LRB): CORONARY ARTERY BYPASS GRAFTING (CABG) X 4  With Right Greater Saphenous vein endoscopic harvesting (N/A) TRANSESOPHAGEAL ECHOCARDIOGRAM (TEE) (N/A) Continue oral amiodarone and beta blocker for A. Fib Continue ambulation and diuresis-weight is up, Lasix 80 mg IV ordered twice a day   LOS: 10 days    Elizabeth [name] 06/13/2016

## 2016-06-13 NOTE — Progress Notes (Signed)
CT surgery p.m. Rounds  Patient sitting up in chair Since she is ready to walk now Complains about some swelling in her right thigh and a vein harvest site which is without evidence of cellulitis on exam Continuing to diuresis well

## 2016-06-14 ENCOUNTER — Inpatient Hospital Stay (HOSPITAL_COMMUNITY): Payer: Medicare Other

## 2016-06-14 DIAGNOSIS — I5043 Acute on chronic combined systolic (congestive) and diastolic (congestive) heart failure: Secondary | ICD-10-CM

## 2016-06-14 LAB — GLUCOSE, CAPILLARY
GLUCOSE-CAPILLARY: 115 mg/dL — AB (ref 65–99)
GLUCOSE-CAPILLARY: 110 mg/dL — AB (ref 65–99)
GLUCOSE-CAPILLARY: 108 mg/dL — AB (ref 65–99)
GLUCOSE-CAPILLARY: 119 mg/dL — AB (ref 65–99)
Glucose-Capillary: 119 mg/dL — ABNORMAL HIGH (ref 65–99)
Glucose-Capillary: 134 mg/dL — ABNORMAL HIGH (ref 65–99)

## 2016-06-14 LAB — CBC
HCT: 24.3 % — ABNORMAL LOW (ref 36.0–46.0)
Hemoglobin: 7.5 g/dL — ABNORMAL LOW (ref 12.0–15.0)
MCH: 27 pg (ref 26.0–34.0)
MCHC: 30.9 g/dL (ref 30.0–36.0)
MCV: 87.4 fL (ref 78.0–100.0)
Platelets: 220 10*3/uL (ref 150–400)
RBC: 2.78 MIL/uL — ABNORMAL LOW (ref 3.87–5.11)
RDW: 15.5 % (ref 11.5–15.5)
WBC: 15.6 10*3/uL — ABNORMAL HIGH (ref 4.0–10.5)

## 2016-06-14 LAB — BASIC METABOLIC PANEL
Anion gap: 7 (ref 5–15)
BUN: 21 mg/dL — ABNORMAL HIGH (ref 6–20)
CO2: 30 mmol/L (ref 22–32)
Calcium: 8.5 mg/dL — ABNORMAL LOW (ref 8.9–10.3)
Chloride: 96 mmol/L — ABNORMAL LOW (ref 101–111)
Creatinine, Ser: 0.81 mg/dL (ref 0.44–1.00)
GFR calc Af Amer: >60 mL/min (ref >60)
GFR calc non Af Amer: >60 mL/min (ref >60)
Glucose, Bld: 103 mg/dL — ABNORMAL HIGH (ref 65–99)
Potassium: 3.7 mmol/L (ref 3.5–5.1)
Sodium: 133 mmol/L — ABNORMAL LOW (ref 135–145)

## 2016-06-14 MED ORDER — POTASSIUM CHLORIDE 10 MEQ/50ML IV SOLN: 10.0000 meq | INTRAVENOUS | Status: DC

## 2016-06-14 MED ORDER — LEVALBUTEROL HCL 0.63 MG/3ML IN NEBU
0.6300 mg | INHALATION_SOLUTION | Freq: Four times a day (QID) | RESPIRATORY_TRACT | Status: DC | PRN
  Administered 2016-06-14: 0.63 mg via RESPIRATORY_TRACT

## 2016-06-14 MED ORDER — POTASSIUM CHLORIDE 10 MEQ/50ML IV SOLN
10.0000 meq | INTRAVENOUS | Status: AC
  Administered 2016-06-14 (×3): 10 meq via INTRAVENOUS
  Filled 2016-06-14 (×3): qty 50

## 2016-06-14 MED ORDER — AMIODARONE IV BOLUS ONLY 150 MG/100ML
150.0000 mg | Freq: Once | INTRAVENOUS | Status: AC
  Administered 2016-06-14: 150 mg via INTRAVENOUS
  Filled 2016-06-14: qty 100

## 2016-06-14 MED ORDER — FUROSEMIDE 10 MG/ML IJ SOLN
80.0000 mg | Freq: Every day | INTRAMUSCULAR | Status: DC
  Administered 2016-06-15: 80 mg via INTRAVENOUS
  Filled 2016-06-14: qty 8

## 2016-06-14 NOTE — Progress Notes (Signed)
6 Days Post-Op Procedure(s) (LRB): CORONARY ARTERY BYPASS GRAFTING (CABG) X 4  With Right Greater Saphenous vein endoscopic harvesting (N/A) TRANSESOPHAGEAL ECHOCARDIOGRAM (TEE) (N/A) Subjective: Having episodes of a-fib, iv amio load ordered CXR still wet- IV lasix ordered WBCup to 15k- urine , blood cultures ordered  Objective: Vital signs in last 24 hours: Temp:  [97.5 F (36.4 C)-98.2 F (36.8 C)] 97.8 F (36.6 C) (07/30 1240) Pulse Rate:  [38-104] 73 (07/30 1500) Cardiac Rhythm: Normal sinus rhythm (07/30 1500) Resp:  [12-27] 17 (07/30 1500) BP: (95-146)/(64-106) 111/79 (07/30 1500) SpO2:  [85 %-98 %] 92 % (07/30 1500) Weight:  [211 lb 6.7 oz (95.9 kg)] 211 lb 6.7 oz (95.9 kg) (07/30 0500)  Hemodynamic parameters for last 24 hours:    Intake/Output from previous day: 07/29 0701 - 07/30 0700 In: 700 [P.O.:420; I.V.:230; IV Piggyback:50] Out: 8889 [Urine:2475] Intake/Output this shift: Total I/O In: 380 [P.O.:320; I.V.:60] Out: -        Exam    General- alert and comfortable   Lungs- clear without rales, wheezes   Cor- regular rate and rhythm, no murmur , gallop   Abdomen- soft, non-tender   Extremities - warm, non-tender, minimal edema   Neuro- oriented, appropriate, no focal weakness   Lab Results:  Recent Labs  06/13/16 0325 06/14/16 0419  WBC 17.7* 15.6*  HGB 8.1* 7.5*  HCT 23.9* 24.3*  PLT 215 220   BMET:  Recent Labs  06/13/16 0325 06/14/16 0419  NA 130* 133*  K 3.7 3.7  CL 95* 96*  CO2 26 30  GLUCOSE 98 103*  BUN 23* 21*  CREATININE 0.84 0.81  CALCIUM 8.2* 8.5*    PT/INR:  Recent Labs  06/13/16 0325  LABPROT 17.0*  INR 1.37   ABG    Component Value Date/Time   PHART 7.447 06/10/2016 1625   HCO3 20.5 06/10/2016 1625   TCO2 21 06/10/2016 1625   ACIDBASEDEF 3.0 (H) 06/10/2016 1625   O2SAT 92.0 06/10/2016 1625   CBG (last 3)   Recent Labs  06/14/16 0321 06/14/16 0826 06/14/16 1238  GLUCAP 110* 119* 134*     Assessment/Plan: S/P Procedure(s) (LRB): CORONARY ARTERY BYPASS GRAFTING (CABG) X 4  With Right Greater Saphenous vein endoscopic harvesting (N/A) TRANSESOPHAGEAL ECHOCARDIOGRAM (TEE) (N/A) Diurese, ambulate and followup cultures   LOS: 11 days    Tharon Aquas Trigt III 06/14/2016

## 2016-06-14 NOTE — Progress Notes (Signed)
Patient Name: Elizabeth [name] Date of Encounter: 06/14/2016  Active Problems:   Dyslipidemia   Anxiety and depression   Essential hypertension   Coronary atherosclerosis   Ischemic cardiomyopathy   Chest pain   Paroxysmal atrial fibrillation (HCC)   CAD (coronary artery disease)   Pain in the chest   Esophageal reflux   Coronary artery disease involving native coronary artery of native heart without angina pectoris   Coronary artery disease   Length of Stay: 11  SUBJECTIVE  Breathing better. Good diuresis. Walked a little to nurse station and back. In/out of atrial fibrillation. Spent most of night in atrial fibrillation with good rate control. Max rate about 120 with walking. Back in NSR as of 7AM.   CURRENT MEDS . sodium chloride   Intravenous Once  . amiodarone  200 mg Oral Q12H   Followed by  . [START ON 06/18/2016] amiodarone  200 mg Oral Daily  . aspirin EC  81 mg Oral Daily   Or  . aspirin  324 mg Per Tube Daily  . bisacodyl  10 mg Oral Daily   Or  . bisacodyl  10 mg Rectal Daily  . carvedilol  12.5 mg Oral BID WC  . digoxin  0.125 mg Oral Daily  . docusate sodium  200 mg Oral Daily  . enoxaparin (LOVENOX) injection  30 mg Subcutaneous Q24H  . fluticasone  2 spray Each Nare Daily  . folic acid  1 mg Oral Daily  . furosemide  80 mg Intravenous BID  . insulin aspart  0-24 Units Subcutaneous Q4H  . levalbuterol  0.63 mg Nebulization TID  . pantoprazole  40 mg Oral Daily  . potassium chloride  10 mEq Intravenous Q1 Hr x 3  . sertraline  50 mg Oral Daily  . sodium chloride flush  10-40 mL Intracatheter Q12H  . sodium chloride flush  3 mL Intravenous Q12H  . thiamine  100 mg Oral Daily    OBJECTIVE   Intake/Output Summary (Last 24 hours) at 06/14/16 0850 Last data filed at 06/14/16 0800  Gross per 24 hour  Intake              690 ml  Output             2475 ml  Net            -1785 ml   Filed Weights   06/12/16 0401 06/13/16 0500 06/14/16 0500   Weight: 96.9 kg (213 lb 10 oz) 97.3 kg (214 lb 8.1 oz) 95.9 kg (211 lb 6.7 oz)    PHYSICAL EXAM Vitals:   06/14/16 0700 06/14/16 0735 06/14/16 0800 06/14/16 0828  BP: 115/82  118/87   Pulse: 95  74   Resp: 17  14   Temp:    97.5 F (36.4 C)  TempSrc:    Oral  SpO2: 97% 93% 91%   Weight:      Height:       General: Alert, oriented x3, no distress Head: no evidence of trauma, PERRL, EOMI, no exophtalmos or lid lag, no myxedema, no xanthelasma; normal ears, nose and oropharynx Neck: normal jugular venous pulsations and no hepatojugular reflux; brisk carotid pulses without delay and no carotid bruits Chest: diminished in both bases, no signs of consolidation by percussion or palpation, normal fremitus, symmetrical and full respiratory excursions Cardiovascular: normal position and quality of the apical impulse, regular rhythm, normal first and second heart sounds, no rubs or gallops, no murmur Abdomen: no  tenderness or distention, no masses by palpation, no abnormal pulsatility or arterial bruits, normal bowel sounds, no hepatosplenomegaly Extremities: no clubbing, cyanosis or edema; 2+ radial, ulnar and brachial pulses bilaterally; 2+ right femoral, posterior tibial and dorsalis pedis pulses; 2+ left femoral, posterior tibial and dorsalis pedis pulses; no subclavian or femoral bruits Neurological: grossly nonfocal  LABS  CBC  Recent Labs  06/13/16 0325 06/14/16 0419  WBC 17.7* 15.6*  HGB 8.1* 7.5*  HCT 23.9* 24.3*  MCV 83.3 87.4  PLT 215 376   Basic Metabolic Panel  Recent Labs  06/13/16 0325 06/14/16 0419  NA 130* 133*  K 3.7 3.7  CL 95* 96*  CO2 26 30  GLUCOSE 98 103*  BUN 23* 21*  CREATININE 0.84 0.81  CALCIUM 8.2* 8.5*   Liver Function Tests  Recent Labs  06/12/16 0520 06/13/16 0325  AST 65* 58*  ALT 65* 58*  ALKPHOS 316* 318*  BILITOT 2.0* 2.6*  PROT 6.7 6.9  ALBUMIN 2.7* 2.6*    Radiology Studies Imaging results have been reviewed and Dg Chest  Port 1 View  Result Date: 06/14/2016 CLINICAL DATA:  CABG EXAM: PORTABLE CHEST 1 VIEW COMPARISON:  06/13/2016 FINDINGS: Right PICC line remains in place, unchanged. Prior CABG. Cardiomegaly with vascular congestion and bilateral airspace disease, likely edema. Findings similar to prior study. IMPRESSION: Continued moderate edema/ CHF with layering effusions.  No change. Electronically Signed   By: Rolm Baptise M.D.   On: 06/14/2016 07:26  Dg Chest Port 1 View  Result Date: 06/13/2016 CLINICAL DATA:  CABG. EXAM: PORTABLE CHEST 1 VIEW COMPARISON:  06/12/2016 FINDINGS: Right PICC line remains in place, unchanged. Cardiomegaly. Bilateral airspace opacities are again noted, likely edema. Layering bilateral effusions with bibasilar atelectasis. IMPRESSION: Moderate CHF. Layering bilateral effusions with bibasilar atelectasis. No real change. Electronically Signed   By: Rolm Baptise M.D.   On: 06/13/2016 07:54  Dg Chest Port 1 View  Result Date: 06/12/2016 CLINICAL DATA:  PICC line repositioned. EXAM: PORTABLE CHEST 1 VIEW COMPARISON:  06/12/2016 FINDINGS: Right PICC line tip now at the cavoatrial junction. Cardiomegaly. Prior CABG. Vascular congestion with bilateral lower lobe opacities and effusions, unchanged. IMPRESSION: Right PICC line tip at the cavoatrial junction. Otherwise no change. Electronically Signed   By: Rolm Baptise M.D.   On: 06/12/2016 13:36  Dg Chest Port 1 View  Result Date: 06/12/2016 CLINICAL DATA:  PICC placement EXAM: PORTABLE CHEST 1 VIEW COMPARISON:  06/12/2016 FINDINGS: Right PICC line again crosses into the left innominate vein, in stable position since prior study. Cardiomegaly. Prior CABG. Bilateral lower lobe opacities and effusions are unchanged. IMPRESSION: Right PICC line continues to course into the left innominate vein, unchanged. Cardiomegaly with bilateral lower lobe opacities and layering effusions. Electronically Signed   By: Rolm Baptise M.D.   On: 06/12/2016  12:34   TELE NSR  ASSESSMENT AND PLAN   1. CAD s/p CABG- recovering well 2. AF with RVR- back inNSR and rate is not well controlled.Plan to keep on amiodarone PO for 30 days postop, unless LFTs deteriorate.Resume Eliquis when OK with CV surgery (temp pacing wires are still in). 3. Chronic (predominantly diastolic) heart failure, mild LV dysfunction - EF 45-50% (preop, chronic), no overt CHF. Expected postop fluid excess, diuresing well (still roughly 10 lb aheadcompared to preop weight).  4. Hypotension- Improved, off vasopressors. Restart carvedilol 12.5 mg BID. Plan to resume lisinopril 2.5 mg daily before discharge if BP still allows. 5. Hyperlipidemia - offstatin due to abnormal  LFTs. 6. Elevated liver enzymes/cholestasis - no longer improving. In fact bili slightly higher today. Etiology unclear.  The abnormality started following admission (normal LFTs on 7/11, minimally abnormal alk phos 7/14), but preceding surgery and predominantly cholestatic (alk phos 880 on 7/23, increased direct bili, transaminases less striking) and steadily improving until 7/30. No gallstones on CT earlier this month, just fatty liver.  Had a similar spike in alk phos and AST/ALT Sep 2014, when she received care for a knee fracture. Consider opiate-related cholestasis? Was it a consequence of restarting atorvastatin after phone call 7/14? Monitor carefully while onamiodarone. Hold off statin until we stop the amiodarone and LFTS normalize.Sanda Klein, MD, [facility] [phone] office [phone] pager 06/14/2016 8:50 AM

## 2016-06-15 ENCOUNTER — Inpatient Hospital Stay (HOSPITAL_COMMUNITY): Payer: Medicare Other

## 2016-06-15 LAB — GLUCOSE, CAPILLARY
GLUCOSE-CAPILLARY: 105 mg/dL — AB (ref 65–99)
GLUCOSE-CAPILLARY: 165 mg/dL — AB (ref 65–99)
GLUCOSE-CAPILLARY: 99 mg/dL (ref 65–99)
Glucose-Capillary: 80 mg/dL (ref 65–99)
Glucose-Capillary: 124 mg/dL — ABNORMAL HIGH (ref 65–99)
Glucose-Capillary: 90 mg/dL (ref 65–99)

## 2016-06-15 LAB — BASIC METABOLIC PANEL
Anion gap: 8 (ref 5–15)
BUN: 20 mg/dL (ref 6–20)
CO2: 30 mmol/L (ref 22–32)
Calcium: 8.5 mg/dL — ABNORMAL LOW (ref 8.9–10.3)
Chloride: 96 mmol/L — ABNORMAL LOW (ref 101–111)
Creatinine, Ser: 0.86 mg/dL (ref 0.44–1.00)
GFR calc Af Amer: >60 mL/min (ref >60)
GFR calc non Af Amer: >60 mL/min (ref >60)
Glucose, Bld: 88 mg/dL (ref 65–99)
Potassium: 3.7 mmol/L (ref 3.5–5.1)
Sodium: 134 mmol/L — ABNORMAL LOW (ref 135–145)

## 2016-06-15 LAB — CBC
HCT: 24.3 % — ABNORMAL LOW (ref 36.0–46.0)
Hemoglobin: 7.4 g/dL — ABNORMAL LOW (ref 12.0–15.0)
MCH: 27.2 pg (ref 26.0–34.0)
MCHC: 30.5 g/dL (ref 30.0–36.0)
MCV: 89.3 fL (ref 78.0–100.0)
Platelets: 229 10*3/uL (ref 150–400)
RBC: 2.72 MIL/uL — ABNORMAL LOW (ref 3.87–5.11)
RDW: 16.1 % — ABNORMAL HIGH (ref 11.5–15.5)
WBC: 14.6 10*3/uL — ABNORMAL HIGH (ref 4.0–10.5)

## 2016-06-15 MED ORDER — SODIUM CHLORIDE 0.9% FLUSH
3.0000 mL | Freq: Two times a day (BID) | INTRAVENOUS | Status: DC
  Administered 2016-06-18: 3 mL via INTRAVENOUS

## 2016-06-15 MED ORDER — POTASSIUM CHLORIDE 10 MEQ/50ML IV SOLN
10.0000 meq | INTRAVENOUS | Status: DC | PRN
  Administered 2016-06-15: 10 meq via INTRAVENOUS

## 2016-06-15 MED ORDER — BISACODYL 10 MG RE SUPP: 10.0000 mg | Freq: Every day | RECTAL | Status: DC | PRN

## 2016-06-15 MED ORDER — ONDANSETRON HCL 4 MG PO TABS: 4.0000 mg | ORAL_TABLET | Freq: Four times a day (QID) | ORAL | Status: DC | PRN

## 2016-06-15 MED ORDER — ONDANSETRON HCL 4 MG/2ML IJ SOLN: 4.0000 mg | Freq: Four times a day (QID) | INTRAMUSCULAR | Status: DC | PRN

## 2016-06-15 MED ORDER — ASPIRIN EC 81 MG PO TBEC
81.0000 mg | DELAYED_RELEASE_TABLET | Freq: Every day | ORAL | Status: DC
  Administered 2016-06-16 – 2016-06-19 (×4): 81 mg via ORAL
  Filled 2016-06-15 (×4): qty 1

## 2016-06-15 MED ORDER — DOCUSATE SODIUM 100 MG PO CAPS
200.0000 mg | ORAL_CAPSULE | Freq: Every day | ORAL | Status: DC
  Administered 2016-06-15 – 2016-06-16 (×2): 200 mg via ORAL
  Filled 2016-06-15 (×3): qty 2

## 2016-06-15 MED ORDER — PANTOPRAZOLE SODIUM 40 MG PO TBEC
40.0000 mg | DELAYED_RELEASE_TABLET | Freq: Every day | ORAL | Status: DC
  Administered 2016-06-16 – 2016-06-19 (×4): 40 mg via ORAL
  Filled 2016-06-15 (×4): qty 1

## 2016-06-15 MED ORDER — COLCHICINE 0.6 MG PO TABS
0.6000 mg | ORAL_TABLET | Freq: Two times a day (BID) | ORAL | Status: AC
  Administered 2016-06-15 – 2016-06-17 (×6): 0.6 mg via ORAL
  Filled 2016-06-15 (×6): qty 1

## 2016-06-15 MED ORDER — FUROSEMIDE 10 MG/ML IJ SOLN
INTRAMUSCULAR | Status: AC
  Filled 2016-06-15: qty 4

## 2016-06-15 MED ORDER — SODIUM CHLORIDE 0.9% FLUSH: 3.0000 mL | INTRAVENOUS | Status: DC | PRN

## 2016-06-15 MED ORDER — TRAMADOL HCL 50 MG PO TABS
50.0000 mg | ORAL_TABLET | ORAL | Status: DC | PRN
  Administered 2016-06-15 – 2016-06-19 (×11): 100 mg via ORAL
  Filled 2016-06-15 (×11): qty 2

## 2016-06-15 MED ORDER — POTASSIUM CHLORIDE 10 MEQ/50ML IV SOLN
INTRAVENOUS | Status: AC
  Filled 2016-06-15: qty 50

## 2016-06-15 MED ORDER — MOVING RIGHT ALONG BOOK
Freq: Once | Status: AC
  Administered 2016-06-15: 1
  Filled 2016-06-15: qty 1

## 2016-06-15 MED ORDER — GUAIFENESIN ER 600 MG PO TB12: 600.0000 mg | ORAL_TABLET | Freq: Two times a day (BID) | ORAL | Status: DC | PRN

## 2016-06-15 MED ORDER — BISACODYL 5 MG PO TBEC
10.0000 mg | DELAYED_RELEASE_TABLET | Freq: Every day | ORAL | Status: DC | PRN
  Administered 2016-06-16: 10 mg via ORAL
  Filled 2016-06-15: qty 2

## 2016-06-15 MED ORDER — SODIUM CHLORIDE 0.9 % IV SOLN: 250.0000 mL | INTRAVENOUS | Status: DC | PRN

## 2016-06-15 MED ORDER — INSULIN ASPART 100 UNIT/ML ~~LOC~~ SOLN: 0.0000 [IU] | Freq: Three times a day (TID) | SUBCUTANEOUS | Status: DC

## 2016-06-15 MED ORDER — ASPIRIN EC 81 MG PO TBEC: 81.0000 mg | DELAYED_RELEASE_TABLET | Freq: Every day | ORAL | Status: DC

## 2016-06-15 MED ORDER — FUROSEMIDE 80 MG PO TABS
80.0000 mg | ORAL_TABLET | Freq: Every day | ORAL | Status: DC
Start: 2016-06-16 — End: 2016-06-17
  Administered 2016-06-16 – 2016-06-17 (×2): 80 mg via ORAL
  Filled 2016-06-15 (×3): qty 1

## 2016-06-15 NOTE — Progress Notes (Signed)
Patient Name: Elizabeth [name] Date of Encounter: 06/15/2016  Active Problems:   Dyslipidemia   Anxiety and depression   Essential hypertension   Coronary atherosclerosis   Ischemic cardiomyopathy   Chest pain   Paroxysmal atrial fibrillation (HCC)   CAD (coronary artery disease)   Pain in the chest   Esophageal reflux   Coronary artery disease involving native coronary artery of native heart without angina pectoris   Coronary artery disease   Length of Stay: 30  59 yo with CAD - s/p CABG  Now with PAF.   SUBJECTIVE  Complains of some gout pain  Sternal wound pain    CURRENT MEDS . sodium chloride   Intravenous Once  . amiodarone  200 mg Oral Q12H   Followed by  . [START ON 06/18/2016] amiodarone  200 mg Oral Daily  . aspirin EC  81 mg Oral Daily   Or  . aspirin  324 mg Per Tube Daily  . bisacodyl  10 mg Oral Daily   Or  . bisacodyl  10 mg Rectal Daily  . carvedilol  12.5 mg Oral BID WC  . digoxin  0.125 mg Oral Daily  . docusate sodium  200 mg Oral Daily  . fluticasone  2 spray Each Nare Daily  . folic acid  1 mg Oral Daily  . furosemide  80 mg Intravenous Daily  . insulin aspart  0-24 Units Subcutaneous Q4H  . levalbuterol  0.63 mg Nebulization TID  . pantoprazole  40 mg Oral Daily  . potassium chloride      . sertraline  50 mg Oral Daily  . sodium chloride flush  10-40 mL Intracatheter Q12H  . sodium chloride flush  3 mL Intravenous Q12H  . thiamine  100 mg Oral Daily    OBJECTIVE   Intake/Output Summary (Last 24 hours) at 06/15/16 1311 Last data filed at 06/15/16 0700  Gross per 24 hour  Intake              345 ml  Output              650 ml  Net             -305 ml   Filed Weights   06/13/16 0500 06/14/16 0500 06/15/16 0500  Weight: 214 lb 8.1 oz (97.3 kg) 211 lb 6.7 oz (95.9 kg) 212 lb (96.2 kg)    PHYSICAL EXAM Vitals:   06/15/16 1000 06/15/16 1100 06/15/16 1200 06/15/16 1306  BP: 112/77 122/90 (!) 127/102   Pulse: 68 70 65   Resp:  12 (!) 21 13   Temp:    97.2 F (36.2 C)  TempSrc:    Axillary  SpO2: 97% 97% 96%   Weight:      Height:       General: Alert, oriented x3, no distress Head: no evidence of trauma, PERRL, EOMI, no exophtalmos or lid lag, no myxedema, no xanthelasma; normal ears, nose and oropharynx Neck: normal jugular venous pulsations and no hepatojugular reflux; brisk carotid pulses without delay and no carotid bruits Chest: diminished in both bases, no signs of consolidation by percussion or palpation, normal fremitus, symmetrical and full respiratory excursions Cardiovascular: normal position and quality of the apical impulse, regular rhythm, normal first and second heart sounds, no rubs or gallops, no murmur Abdomen: no tenderness or distention, no masses by palpation, no abnormal pulsatility or arterial bruits, normal bowel sounds, no hepatosplenomegaly Extremities: no clubbing, cyanosis or edema;   Neurological: grossly  nonfocal  LABS  CBC  Recent Labs  06/14/16 0419 06/15/16 0422  WBC 15.6* 14.6*  HGB 7.5* 7.4*  HCT 24.3* 24.3*  MCV 87.4 89.3  PLT 220 622   Basic Metabolic Panel  Recent Labs  06/14/16 0419 06/15/16 0422  NA 133* 134*  K 3.7 3.7  CL 96* 96*  CO2 30 30  GLUCOSE 103* 88  BUN 21* 20  CREATININE 0.81 0.86  CALCIUM 8.5* 8.5*   Liver Function Tests  Recent Labs  06/13/16 0325  AST 58*  ALT 58*  ALKPHOS 318*  BILITOT 2.6*  PROT 6.9  ALBUMIN 2.6*    Radiology Studies Imaging results have been reviewed and Dg Chest Port 1 View  Result Date: 06/15/2016 CLINICAL DATA:  Shortness of breath, history of coronary artery disease and CABG, cardiomyopathy, current smoker. EXAM: PORTABLE CHEST 1 VIEW COMPARISON:  Portable chest x-ray of June 14, 2016 FINDINGS: The lungs remain mildly hypoinflated. There are bilateral pleural effusions which are slightly less conspicuous today. There is left lower lobe and possibly right lower lobe atelectasis or pneumonia. The  cardiac silhouette remains enlarged. There are post CABG changes. The pulmonary vascularity is less engorged. The PICC line tip projects over the distal third of the SVC. There is calcification in the wall of the aortic arch. IMPRESSION: Slight improvement in pulmonary interstitial edema. Persistent bibasilar atelectasis or pneumonia with small bilateral pleural effusions. Aortic atherosclerosis. Electronically Signed   By: David  Martinique M.D.   On: 06/15/2016 07:27  Dg Chest Port 1 View  Result Date: 06/14/2016 CLINICAL DATA:  CABG EXAM: PORTABLE CHEST 1 VIEW COMPARISON:  06/13/2016 FINDINGS: Right PICC line remains in place, unchanged. Prior CABG. Cardiomegaly with vascular congestion and bilateral airspace disease, likely edema. Findings similar to prior study. IMPRESSION: Continued moderate edema/ CHF with layering effusions.  No change. Electronically Signed   By: Rolm Baptise M.D.   On: 06/14/2016 07:26   TELE NSR. PACS  ASSESSMENT AND PLAN   1. CAD s/p CABG- recovering well 2. AF with RVR- back inNSR and rate is not well controlled.Plan to keep on amiodarone PO for 30 days postop, unless LFTs deteriorate.Resume Eliquis when OK with CV surgery (temp pacing wires are still in). 3. Chronic (predominantly diastolic) heart failure, mild LV dysfunction - EF 45-50% (preop, chronic), no overt CHF. Expected postop fluid excess, diuresing well   4. Hypotension- Improved, off vasopressors. Restart carvedilol 12.5 mg BID. Plan to resume lisinopril 2.5 mg daily before discharge if BP still allows. 5. Hyperlipidemia - offstatin due to abnormal LFTs. 6. Elevated liver enzymes/cholestasis -     Mertie Moores, MD  06/15/2016 1:16 PM    [facility] [address],  [place], [place]  [address] Pager [phone] Phone: [phone]; Fax: [phone]

## 2016-06-15 NOTE — Progress Notes (Signed)
Called report to Berniece Salines RN on 2W.  Levon Hedger, RN 8:07 PM

## 2016-06-15 NOTE — Care Management Important Message (Signed)
Important Message  Patient Details  Name: Elizabeth [name] MRN: [id number] Date of Birth: 1956-09-24   Medicare Important Message Given:  Yes    [name] 06/15/2016, 3:14 PM

## 2016-06-15 NOTE — Progress Notes (Signed)
Transported patient to 2W25 with Marcene Brawn, NT by wheelchair on [facility]. VS stable on transfer NSR 80-90s. O2 Sat 98%. Jinny Blossom, RN at bedside. Patient in bed and on 2W Monitor.  Levon Hedger, RN

## 2016-06-15 NOTE — Progress Notes (Signed)
Patient ID: Elizabeth [name], female   DOB: 1956/07/04, 61 y.o.   MRN: [id number] TCTS DAILY ICU PROGRESS NOTE                   [place]            [place] [address]          [phone]   7 Days Post-Op Procedure(s) (LRB): CORONARY ARTERY BYPASS GRAFTING (CABG) X 4  With Right Greater Saphenous vein endoscopic harvesting (N/A) TRANSESOPHAGEAL ECHOCARDIOGRAM (TEE) (N/A)  Total Length of Stay:  LOS: 12 days   Subjective: Sinus with pac's, now pain right great toe , has history of gout recently  Objective: Vital signs in last 24 hours: Temp:  [97.2 F (36.2 C)-98.4 F (36.9 C)] 97.2 F (36.2 C) (07/31 1306) Pulse Rate:  [62-90] 65 (07/31 1200) Cardiac Rhythm: Normal sinus rhythm (07/31 0800) Resp:  [12-27] 13 (07/31 1200) BP: (91-127)/(57-102) 127/102 (07/31 1200) SpO2:  [92 %-100 %] 96 % (07/31 1200) FiO2 (%):  [40 %] 40 % (07/30 1927) Weight:  [212 lb (96.2 kg)] 212 lb (96.2 kg) (07/31 0500)  Filed Weights   06/13/16 0500 06/14/16 0500 06/15/16 0500  Weight: 214 lb 8.1 oz (97.3 kg) 211 lb 6.7 oz (95.9 kg) 212 lb (96.2 kg)    Weight change: 9.3 oz (0.263 kg)   Hemodynamic parameters for last 24 hours:    Intake/Output from previous day: 07/30 0701 - 07/31 0700 In: 705 [P.O.:445; I.V.:210; IV Piggyback:50] Out: 1100 [Urine:1100]  Intake/Output this shift: No intake/output data recorded.  Current Meds: Scheduled Meds: . sodium chloride   Intravenous Once  . amiodarone  200 mg Oral Q12H   Followed by  . [START ON 06/18/2016] amiodarone  200 mg Oral Daily  . aspirin EC  81 mg Oral Daily   Or  . aspirin  324 mg Per Tube Daily  . bisacodyl  10 mg Oral Daily   Or  . bisacodyl  10 mg Rectal Daily  . carvedilol  12.5 mg Oral BID WC  . digoxin  0.125 mg Oral Daily  . docusate sodium  200 mg Oral Daily  . fluticasone  2 spray Each Nare Daily  . folic acid  1 mg Oral Daily  . furosemide  80 mg Intravenous Daily  . insulin aspart  0-24  Units Subcutaneous Q4H  . levalbuterol  0.63 mg Nebulization TID  . pantoprazole  40 mg Oral Daily  . potassium chloride      . sertraline  50 mg Oral Daily  . sodium chloride flush  10-40 mL Intracatheter Q12H  . sodium chloride flush  3 mL Intravenous Q12H  . thiamine  100 mg Oral Daily   Continuous Infusions: . sodium chloride Stopped (06/09/16 0930)  . sodium chloride Stopped (06/11/16 0904)  . sodium chloride 10 mL/hr at 06/13/16 1945  . lactated ringers Stopped (06/08/16 2000)  . lactated ringers Stopped (06/10/16 0900)   PRN Meds:.sodium chloride, lactated ringers, levalbuterol, metoprolol, ondansetron (ZOFRAN) IV, oxyCODONE, potassium chloride, sodium chloride flush, sodium chloride flush, traMADol  General appearance: alert, cooperative and no distress Neurologic: intact Heart: regular rate and rhythm, S1, S2 normal, no murmur, click, rub or gallop Lungs: diminished breath sounds bibasilar Abdomen: soft, non-tender; bowel sounds normal; no masses,  no organomegaly Extremities: extremities normal, atraumatic, no cyanosis or edema, Homans sign is negative, no sign of DVT and tender right great toe  Wound: sternum stable  Lab Results:  CBC: Recent Labs  06/14/16 0419 06/15/16 0422  WBC 15.6* 14.6*  HGB 7.5* 7.4*  HCT 24.3* 24.3*  PLT 220 229   BMET:  Recent Labs  06/14/16 0419 06/15/16 0422  NA 133* 134*  K 3.7 3.7  CL 96* 96*  CO2 30 30  GLUCOSE 103* 88  BUN 21* 20  CREATININE 0.81 0.86  CALCIUM 8.5* 8.5*    CMET: Lab Results  Component Value Date   WBC 14.6 (H) 06/15/2016   HGB 7.4 (L) 06/15/2016   HCT 24.3 (L) 06/15/2016   PLT 229 06/15/2016   GLUCOSE 88 06/15/2016   CHOL 218 (H) 05/26/2016   TRIG 102 05/26/2016   HDL 49 05/26/2016   LDLDIRECT 145.2 10/29/2011   LDLCALC 149 (H) 05/26/2016   ALT 58 (H) 06/13/2016   AST 58 (H) 06/13/2016   NA 134 (L) 06/15/2016   K 3.7 06/15/2016   CL 96 (L) 06/15/2016   CREATININE 0.86 06/15/2016   BUN 20  06/15/2016   CO2 30 06/15/2016   TSH 2.779 06/10/2016   INR 1.37 06/13/2016   HGBA1C 6.2 (H) 06/07/2016    PT/INR:  Recent Labs  06/13/16 0325  LABPROT 17.0*  INR 1.37   Radiology: Dg Chest Port 1 View  Result Date: 06/15/2016 CLINICAL DATA:  Shortness of breath, history of coronary artery disease and CABG, cardiomyopathy, current smoker. EXAM: PORTABLE CHEST 1 VIEW COMPARISON:  Portable chest x-ray of June 14, 2016 FINDINGS: The lungs remain mildly hypoinflated. There are bilateral pleural effusions which are slightly less conspicuous today. There is left lower lobe and possibly right lower lobe atelectasis or pneumonia. The cardiac silhouette remains enlarged. There are post CABG changes. The pulmonary vascularity is less engorged. The PICC line tip projects over the distal third of the SVC. There is calcification in the wall of the aortic arch. IMPRESSION: Slight improvement in pulmonary interstitial edema. Persistent bibasilar atelectasis or pneumonia with small bilateral pleural effusions. Aortic atherosclerosis. Electronically Signed   By: David  Martinique M.D.   On: 06/15/2016 07:27    Assessment/Plan: S/P Procedure(s) (LRB): CORONARY ARTERY BYPASS GRAFTING (CABG) X 4  With Right Greater Saphenous vein endoscopic harvesting (N/A) TRANSESOPHAGEAL ECHOCARDIOGRAM (TEE) (N/A) Mobilize Diuresis Diabetes control Plan for transfer to step-down: see transfer orders Now with gout in right great toe - start col chine  renal function stable Holding sinus now     [name] 06/15/2016 1:53 PM

## 2016-06-15 NOTE — Progress Notes (Signed)
TCTS BRIEF SICU PROGRESS NOTE  7 Days Post-Op  S/P Procedure(s) (LRB): CORONARY ARTERY BYPASS GRAFTING (CABG) X 4  With Right Greater Saphenous vein endoscopic harvesting (N/A) TRANSESOPHAGEAL ECHOCARDIOGRAM (TEE) (N/A)   Stable day Awaiting bed on step-down unit for transfer  Plan: Continue current plan  Rexene Alberts, MD 06/15/2016 7:36 PM

## 2016-06-16 ENCOUNTER — Inpatient Hospital Stay (HOSPITAL_COMMUNITY): Payer: Medicare Other

## 2016-06-16 LAB — COMPREHENSIVE METABOLIC PANEL
ALT: 41 U/L (ref 14–54)
AST: 48 U/L — ABNORMAL HIGH (ref 15–41)
Albumin: 2.6 g/dL — ABNORMAL LOW (ref 3.5–5.0)
Alkaline Phosphatase: 373 U/L — ABNORMAL HIGH (ref 38–126)
Anion gap: 5 (ref 5–15)
BUN: 19 mg/dL (ref 6–20)
CO2: 32 mmol/L (ref 22–32)
Calcium: 8.6 mg/dL — ABNORMAL LOW (ref 8.9–10.3)
Chloride: 97 mmol/L — ABNORMAL LOW (ref 101–111)
Creatinine, Ser: 0.9 mg/dL (ref 0.44–1.00)
GFR calc Af Amer: >60 mL/min (ref >60)
GFR calc non Af Amer: >60 mL/min (ref >60)
Glucose, Bld: 89 mg/dL (ref 65–99)
Potassium: 4.2 mmol/L (ref 3.5–5.1)
Sodium: 134 mmol/L — ABNORMAL LOW (ref 135–145)
Total Bilirubin: 1.8 mg/dL — ABNORMAL HIGH (ref 0.3–1.2)
Total Protein: 7.1 g/dL (ref 6.5–8.1)

## 2016-06-16 LAB — CBC
HCT: 24.5 % — ABNORMAL LOW (ref 36.0–46.0)
Hemoglobin: 7.5 g/dL — ABNORMAL LOW (ref 12.0–15.0)
MCH: 27.8 pg (ref 26.0–34.0)
MCHC: 30.6 g/dL (ref 30.0–36.0)
MCV: 90.7 fL (ref 78.0–100.0)
Platelets: 237 10*3/uL (ref 150–400)
RBC: 2.7 MIL/uL — ABNORMAL LOW (ref 3.87–5.11)
RDW: 16.5 % — ABNORMAL HIGH (ref 11.5–15.5)
WBC: 11.9 10*3/uL — ABNORMAL HIGH (ref 4.0–10.5)

## 2016-06-16 LAB — GLUCOSE, CAPILLARY: Glucose-Capillary: 89 mg/dL (ref 65–99)

## 2016-06-16 MED ORDER — POTASSIUM CHLORIDE CRYS ER 20 MEQ PO TBCR
40.0000 meq | EXTENDED_RELEASE_TABLET | Freq: Every day | ORAL | Status: DC
  Administered 2016-06-16 – 2016-06-17 (×2): 40 meq via ORAL
  Filled 2016-06-16 (×2): qty 2

## 2016-06-16 MED ORDER — AMIODARONE HCL 200 MG PO TABS
200.0000 mg | ORAL_TABLET | Freq: Every day | ORAL | Status: DC
  Administered 2016-06-16 – 2016-06-19 (×4): 200 mg via ORAL
  Filled 2016-06-16 (×4): qty 1

## 2016-06-16 MED ORDER — LEVALBUTEROL HCL 0.63 MG/3ML IN NEBU
0.6300 mg | INHALATION_SOLUTION | Freq: Two times a day (BID) | RESPIRATORY_TRACT | Status: DC
  Administered 2016-06-17 (×2): 0.63 mg via RESPIRATORY_TRACT
  Filled 2016-06-16 (×2): qty 3

## 2016-06-16 MED ORDER — ACETAMINOPHEN 325 MG PO TABS
650.0000 mg | ORAL_TABLET | Freq: Four times a day (QID) | ORAL | Status: DC | PRN
  Administered 2016-06-16 – 2016-06-18 (×5): 650 mg via ORAL
  Filled 2016-06-16 (×6): qty 2

## 2016-06-16 NOTE — Progress Notes (Signed)
Pt. Transferred from 2S via w/c and O2; resting in bed; alert and oriented x4; c/o sternal pain at surgical site.

## 2016-06-16 NOTE — Progress Notes (Addendum)
[address]       [place] [address]             [phone]        8 Days Post-Op Procedure(s) (LRB): CORONARY ARTERY BYPASS GRAFTING (CABG) X 4  With Right Greater Saphenous vein endoscopic harvesting (N/A) TRANSESOPHAGEAL ECHOCARDIOGRAM (TEE) (N/A)  Subjective: Patient with sternal incisional pain.  Objective: Vital signs in last 24 hours: Temp:  [97.2 F (36.2 C)-98.4 F (36.9 C)] 98 F (36.7 C) (08/01 0408) Pulse Rate:  [56-119] 69 (08/01 0408) Cardiac Rhythm: Normal sinus rhythm (08/01 0700) Resp:  [12-23] 20 (08/01 0408) BP: (100-153)/(63-121) 111/63 (08/01 0408) SpO2:  [93 %-100 %] 93 % (08/01 0745) Weight:  [214 lb 1.6 oz (97.1 kg)] 214 lb 1.6 oz (97.1 kg) (08/01 0408)  Pre op weight 92 kg Current Weight  06/16/16 214 lb 1.6 oz (97.1 kg)       Intake/Output from previous day: 07/31 0701 - 08/01 0700 In: 300 [P.O.:240; I.V.:60] Out: 1350 [Urine:1350]   Physical Exam:  Cardiovascular: RRR Pulmonary: Diminished at bases Abdomen: Soft, non tender, bowel sounds present. Extremities: Bilateral lower extremity edema. Wounds: Right lower extremity wounds are clean and dry.  No erythema or signs of infection. Minor sero sanguinous drainage lower sternal wound-likely fat necrosis.  Lab Results: CBC: Recent Labs  06/15/16 0422 06/16/16 0340  WBC 14.6* 11.9*  HGB 7.4* 7.5*  HCT 24.3* 24.5*  PLT 229 237   BMET:  Recent Labs  06/15/16 0422 06/16/16 0340  NA 134* 134*  K 3.7 4.2  CL 96* 97*  CO2 30 32  GLUCOSE 88 89  BUN 20 19  CREATININE 0.86 0.90  CALCIUM 8.5* 8.6*    PT/INR:  Lab Results  Component Value Date   INR 1.37 06/13/2016   INR 1.39 06/08/2016   INR 1.35 06/02/2016   ABG:  INR: Will add last result for INR, ABG once components are confirmed Will add last 4 CBG results once components are confirmed  Assessment/Plan:  1. CV - Previous a fib. Maintaining SR in the 60's this am. On Amiodarone 200 mg  bid.Coreg 12.5 mg bid, Digoxin 0.125 mg daily. Will decrease Amiodarone to 200 mg daily. 2.  Pulmonary - On 5 liters via [place]. Wean to room air as able. CXR this am shows no pneumothorax, bilateral pleural effusions and atelectasis, stable cardiomegaly. Encourage incentive spirometer and flutter valve. 3. Volume Overload - On Lasix 80 mg daily 4.  Acute blood loss anemia - H and H stable at 7.5 and 24.5 5. CBGs 90/105/89. Pre op HGA1C 6.2. She is likely pre diabetic. Will stop accu checks, SS PRN. 6. Remove EPW in am 7. Tylenol PRN mil pain   ZIMMERMAN,DONIELLE MPA-C 06/16/2016,8:01 AM  Holding sinus now  Will d/c pacing wires , before d/c will need to resume eliquis I have seen and examined [name] and agree with the above assessment  and plan.  [name] MD [facility] [phone] Office [phone] 06/16/2016 11:52 AM

## 2016-06-16 NOTE — Progress Notes (Signed)
CARDIAC REHAB PHASE I   PRE:  Rate/Rhythm: 59 afib    BP: sitting 107/73    SaO2: 7 5L  MODE:  Ambulation: 150 ft   POST:  Rate/Rhythm: 73 SR    BP: sitting 115/74     SaO2: 94 4L but slow register   Pt c/o significant sternal pain. Also SOB. She moves fairly well however she is using her arms for bed mobility. Able to walk with rollator and 4L O2. Her 150 ft was hurried as she needed to have a BM. SaO2 difficult to register during walk and ultimately did not register until 3-4 min of rest back in room. Some delay in appropriate thinking during walk. To bed, put on 4L. RN present. Encouraged IS. Needs more walking. [place], ACSM 06/16/2016 2:27 PM

## 2016-06-16 NOTE — Progress Notes (Addendum)
EPW removed her protocol. Patient and family at bedside Bedrest until 4:10PM. VSS.  Bedrest complete. Patient VSS.  Fritz Pickerel, RN

## 2016-06-16 NOTE — Plan of Care (Signed)
Problem: Pain Management: Goal: Pain level will decrease Outcome: Progressing Oxycodone d/c'd and pt. taking Ultram; pt. aware of next time due and no request for hours.

## 2016-06-17 ENCOUNTER — Encounter: Payer: Self-pay | Admitting: Pharmacist

## 2016-06-17 MED ORDER — ATORVASTATIN CALCIUM 20 MG PO TABS
20.0000 mg | ORAL_TABLET | Freq: Every day | ORAL | Status: DC
  Administered 2016-06-17: 20 mg via ORAL
  Filled 2016-06-17: qty 1

## 2016-06-17 MED ORDER — POLYSACCHARIDE IRON COMPLEX 150 MG PO CAPS
150.0000 mg | ORAL_CAPSULE | Freq: Every day | ORAL | Status: DC
  Administered 2016-06-17 – 2016-06-19 (×3): 150 mg via ORAL
  Filled 2016-06-17 (×2): qty 1

## 2016-06-17 MED ORDER — FUROSEMIDE 40 MG PO TABS
40.0000 mg | ORAL_TABLET | Freq: Every day | ORAL | Status: DC
  Administered 2016-06-18 – 2016-06-19 (×2): 40 mg via ORAL
  Filled 2016-06-17 (×2): qty 1

## 2016-06-17 MED ORDER — LEVALBUTEROL HCL 0.63 MG/3ML IN NEBU: 0.6300 mg | INHALATION_SOLUTION | Freq: Four times a day (QID) | RESPIRATORY_TRACT | Status: DC | PRN

## 2016-06-17 NOTE — Progress Notes (Signed)
Patient Name: Elizabeth [name] Date of Encounter: 06/17/2016  Active Problems:   Dyslipidemia   Anxiety and depression   Essential hypertension   Coronary atherosclerosis   Ischemic cardiomyopathy   Chest pain   Paroxysmal atrial fibrillation (HCC)   CAD (coronary artery disease)   Pain in the chest   Esophageal reflux   Coronary artery disease involving native coronary artery of native heart without angina pectoris   Coronary artery disease   Length of Stay: 41  60 yo with CAD - s/p CABG  Now with PAF.   SUBJECTIVE  Seems to be doing great. VSS     CURRENT MEDS . amiodarone  200 mg Oral Daily  . aspirin EC  81 mg Oral Daily  . atorvastatin  20 mg Oral q1800  . carvedilol  12.5 mg Oral BID WC  . colchicine  0.6 mg Oral BID  . digoxin  0.125 mg Oral Daily  . docusate sodium  200 mg Oral Daily  . fluticasone  2 spray Each Nare Daily  . folic acid  1 mg Oral Daily  . furosemide  80 mg Oral Daily  . levalbuterol  0.63 mg Nebulization BID  . pantoprazole  40 mg Oral QAC breakfast  . potassium chloride  40 mEq Oral Daily  . sertraline  50 mg Oral Daily  . sodium chloride flush  3 mL Intravenous Q12H  . thiamine  100 mg Oral Daily    OBJECTIVE   Intake/Output Summary (Last 24 hours) at 06/17/16 1401 Last data filed at 06/16/16 1800  Gross per 24 hour  Intake              240 ml  Output                0 ml  Net              240 ml   Filed Weights   06/16/16 0408 06/17/16 0456 06/17/16 0459  Weight: 214 lb 1.6 oz (97.1 kg) 210 lb 3.2 oz (95.3 kg) 210 lb 3.2 oz (95.3 kg)    PHYSICAL EXAM Vitals:   06/17/16 0738 06/17/16 0935 06/17/16 0938 06/17/16 1320  BP:  139/83  92/66  Pulse:  67  64  Resp:    18  Temp:    98.4 F (36.9 C)  TempSrc:    Oral  SpO2: 94%  90% 95%  Weight:      Height:       General: Alert, oriented x3, no distress Head: no evidence of trauma, PERRL, EOMI, no exophtalmos or lid lag, no myxedema, no xanthelasma; normal ears, nose  and oropharynx Neck: normal jugular venous pulsations and no hepatojugular reflux; brisk carotid pulses without delay and no carotid bruits Chest: clear  Cardiovascular:  RR , normal S1S2  Abdomen: no tenderness or distention, no masses by palpation, no abnormal pulsatility or arterial bruits, normal bowel sounds, no hepatosplenomegaly Extremities: no clubbing, cyanosis or edema;   Neurological: grossly nonfocal  LABS  CBC  Recent Labs  06/15/16 0422 06/16/16 0340  WBC 14.6* 11.9*  HGB 7.4* 7.5*  HCT 24.3* 24.5*  MCV 89.3 90.7  PLT 229 212   Basic Metabolic Panel  Recent Labs  06/15/16 0422 06/16/16 0340  NA 134* 134*  K 3.7 4.2  CL 96* 97*  CO2 30 32  GLUCOSE 88 89  BUN 20 19  CREATININE 0.86 0.90  CALCIUM 8.5* 8.6*   Liver Function Tests  Recent Labs  06/16/16 0340  AST 48*  ALT 41  ALKPHOS 373*  BILITOT 1.8*  PROT 7.1  ALBUMIN 2.6*    Radiology Studies Imaging results have been reviewed and Dg Chest 2 View  Result Date: 06/16/2016 CLINICAL DATA:  One week postoperative from CABG, patient reports chest discomfort and shortness of breath this morning EXAM: CHEST  2 VIEW COMPARISON:  Portable chest x-ray of June 15, 2016. FINDINGS: The lungs are reasonably well inflated. There remain small bilateral pleural effusions. There is bibasilar density not greatly changed from the previous study. The cardiac silhouette remains enlarged. The pulmonary vascularity is not engorged. The sternal wires are intact. The retrosternal soft tissues are unremarkable. There is no pneumothorax. The PICC line tip projects over the distal third of the SVC. IMPRESSION: Persistent bibasilar atelectasis and small pleural effusions greatest on the left. Stable cardiomegaly without significant pulmonary vascular congestion or pulmonary edema. No pneumothorax. Electronically Signed   By: David  Martinique M.D.   On: 06/16/2016 07:20    TELE NSR. PACS  ASSESSMENT AND PLAN   1. CAD s/p  CABG- recovering well 2. AF with RVR- has converted to NSR Has not had any significant amount of atrial fib recently Agree with Amio for a month and then we can Dc.  She has maintained NSR for the past week ( on amio)  We should consider starting her on Eliquis 5 bid when OK with Dr. Servando Snare. Will need to watch her Hb since she is already anemic.  Marland Kitchen 3. Chronic (predominantly diastolic) heart failure, mild LV dysfunction - EF 45-50% (preop, chronic), no overt CHF. She was on Lasix 40 mg a day as OP. Will decrease her dose to her home dose  On carvedilol 12.5 mg BID. BP is still low  92/66 at last check. Will add ARB or ACE-I as OP if BP is higher.   5. Hyperlipidemia -on Atrovastatin 20 Will need to have her LFTs followed closely as OP   6.  Anemia:   Hb ws 7.5 yesterday  She is on folate, She may benefit from Nu-iron . Will start Nu-iron 150 mg a day    Further plans per surgery    Mertie Moores, MD  06/17/2016 2:01 PM    [facility] [place],  [place], [place]  [address] Pager [phone] Phone: [phone]; Fax: [phone]

## 2016-06-17 NOTE — Progress Notes (Signed)
1330 Offered to walk with pt but she declined. Stated has been walking back and forth to bathroom. Encouraged pt to walk with staff later as she does not feel up to it right now. Graylon Good RN BSN 06/17/2016 1:29 PM

## 2016-06-17 NOTE — Addendum Note (Signed)
Addended by: Forde Dandy on: 06/17/2016 06:53 PM   Modules accepted: Orders

## 2016-06-17 NOTE — Discharge Summary (Signed)
Physician Discharge Summary       [address]       [place] [address]             [phone]    Patient ID: Elizabeth [name] MRN: [id number] DOB/AGE: [birth year] 60 y.o.  Admit date: 05/30/2016 Discharge date: 06/19/2016  Admission Diagnoses: 1. Unstable angina 2. Coronary artery disease  Active Diagnoses:  1. Dyslipidemia 2. Essential hypertension 3.  Ischemic cardiomyopathy 4. Paroxysmal atrial fibrillation (HCC) 5. Anxiety and depression 6. Substance abuse (coocaine) 7. GERD (gastroesophageal reflux disease) 8. History of microcytic hypochromic anemia 9. Fibromyalgia 10. Fibroids 11. Insomnia 12. Pulmonary nodules (noted on CXR and CT 05/2008, repeat CT 10/2008 - Stable small bilateral pulmonary nodules measuring up to 6 mm) 13. Lupus [facility])  Consults: cardiology   Procedure (s):  Left Heart Cath and Coronary Angiography by Dr. Tamala Julian on 06/02/2016:  Conclusion   1. Prox Cx to Dist Cx lesion, 40% stenosed. The lesion was previously treated with a stent (unknown type). 2. Dist Cx lesion, 90% stenosed. 3. Ost 2nd Mrg to 2nd Mrg lesion, 85% stenosed. 4. Mid RCA lesion, 100% stenosed. 5. Dist RCA lesion, 100% stenosed. 6. Ost 1st Diag to 1st Diag lesion, 90% stenosed. 7. Ost 2nd Diag to 2nd Diag lesion, 90% stenosed. 8. Prox LAD to Mid LAD lesion, 50% stenosed. 9. Dist LAD lesion, 80% stenosed. 10. 1st Sept lesion, 70% stenosed.    Coronary artery bypass grafting x4 with the left internal mammary to the left anterior descending coronary artery. Sequential reverse saphenous vein graft to the obtuse marginal and distal circumflex branches and reverse saphenous vein graft to the distal right coronary artery with right thigh endo vein harvesting of the right greater saphenous vein by Dr. Servando Snare on 06/08/2016.  History of Presenting Illness: Elizabeth [name] is a 60 yo morbidly obese African American female with known history of CAD, paroxysmal atrial  fibrillation,  hypertension, hyperlipidemia, TIA,  lupus, fibromyalgia, and GERD.  Her CAD started in 2002 at which time she presented with an MI, + cocaine abuse leading to PCI with bare metal stent placement to her Circumflex vessel.  She has been on Eliquis, but states she has not been taken in 3 days.  She presented to the ED on 05/31/2016 with complaints of chest/abdominal pain, and headache.  She stated she had been in her normal state of health until about a week ago at which point she had run out of her medications.  She developed chest pain the afternoon of presentation; however, she mentions she has been having epigastric pain with vomiting for th past 2 days.  EKG showed no changes, Troponin was negative, and CXR was concerning for possible pneumonia.  She was started on ABX, oxygen levels were low and she was placed on oxygen.  She was admitted via medicine for further workup.   It was felt her symptoms were likely reflux related.  She was restarted on PPI therapy.  Serial Troponin levels became very mildly positive.  It was felt due to her history of CAD she should have a stress test.  This was done and showed anterior ischemia concerning for progression of known disease.  It was felt she should undergo cardiac catheterization for better look at coronary arteries.  This was done on 06/02/2016 and showed  multivessel CAD.  It was felt coronary bypass grafting would be indicated and TCTS consult was obtained.   She states she did have an episode of  dyspnea since she has been here.  She states she is currently not using cocaine and has not used in the last year.  She denies use of pain medications and drugs currently, however her toxicology screen is positive for Opiods.  She continues to smoke several cigarettes per day and states she has smoked for a long time.  A cardiothoracic consultation as obtained with Dr. Servando Snare for the consideration of coronary artery bypass grafting surgery. Potential risks,  benefits, and complications were discussed with the patient and she agreed to proceed with surgery. Pre operative carotid duplex showed no significant internal carotid artery stenosis bilaterally. She underwent a CABG x 4 on 06/09/2016.  Brief Hospital Course:  The patient was extubated the evening of surgery without difficulty. She remained afebrile and hemodynamically stable. [name], a line, chest tubes, and foley were removed early in the post operative course. Coreg was started and titrated accordingly. She did go into a fib and was put on Amiodarone. She  then converted to sinus rhythm. She was volume over loaded and diuresed. She had ABL anemia. She did not require a post op transfusion. Her last H and H was stable at 7.5 and 24.5. She was weaned off the insulin drip. The patient's HGA1C pre op was 6.2. She is pre diabetic and will require further surveillance of her HGA1C by her medical doctor after discharge. The patient was felt surgically stable for transfer from the ICU to PCTU for further convalescence on 06/15/2016. She continues to progress with cardiac rehab. She required several liters of oxygen via [place]. We were able to wean her to 2 liters of oxygen via [place]. She will require home oxygen. Per cardiology, she was restarted on Eliquis 5 mg bid. Also, it was felt she would benefit from a baby ecasa daily. She has been tolerating a diet and has had a bowel movement. We will continued daily Lasix until she is seen post op in the office. She went down for a chest x ray on 08/03 and had complaints of chest pain and numbness/tingling of fingers on left hand. She had no acute neuro deficits. She had artifact on the monitor and it was not VF. Dr. Servando Snare saw her and she had already been given Nitro. She had no further chest pain and tingling/numbness of fingers on left hand resolved. Epicardial pacing wires were removed on 06/16/2016. Chest tube sutures will be removed today. The patient is felt  surgically stable for discharge today.   Latest Vital Signs: Blood pressure 121/67, pulse 72, temperature 97.6 F (36.4 C), temperature source Oral, resp. rate 16, height _0  (1.575 m), weight 207 lb 11.2 oz (94.2 kg), last menstrual period 12/30/1994, SpO2 97 %.  Physical Exam: Cardiovascular: RRR Pulmonary: Diminished at bases Abdomen: Soft, non tender, bowel sounds present. Extremities: Bilateral lower extremity edema. Wounds: Right lower extremity wounds are clean and dry.  No erythema or signs of infection.   Discharge Condition:Stable and discharged to home.  Recent laboratory studies:  Lab Results  Component Value Date   WBC 9.6 06/19/2016   HGB 7.9 (L) 06/19/2016   HCT 26.4 (L) 06/19/2016   MCV 91.0 06/19/2016   PLT 331 06/19/2016   Lab Results  Component Value Date   NA 137 06/19/2016   K 4.0 06/19/2016   CL 99 (L) 06/19/2016   CO2 29 06/19/2016   CREATININE 0.90 06/19/2016   GLUCOSE 97 06/19/2016    Diagnostic Studies:  EXAM: CHEST  2 VIEW  COMPARISON:  PA and lateral chest x-ray of June 16, 2016.  FINDINGS: The lungs are adequately inflated. There are persistent small bilateral pleural effusions with bibasilar atelectasis or infiltrate. The cardiac silhouette remains enlarged. The central pulmonary vascularity is prominent and there is mild cephalization of the vascular pattern. The PICC line tip projects over the distal third of the SVC.  IMPRESSION: Slightly decreased density at the lung bases consistent with resolving atelectasis or pneumonia. Persistent small pleural effusions. Stable cardiomegaly and central pulmonary vascular congestion.   Electronically Signed   By: David  Martinique M.D.   On: 06/18/2016 08:34  Ct Abdomen Pelvis W Contrast  Result Date: 05/31/2016 CLINICAL DATA:  60 year old female Next with chest pain, upper abdominal and epigastric pain, nausea vomiting. EXAM: CT ABDOMEN AND PELVIS WITH CONTRAST TECHNIQUE:  Multidetector CT imaging of the abdomen and pelvis was performed using the standard protocol following bolus administration of intravenous contrast. CONTRAST:  174m ISOVUE-300 IOPAMIDOL (ISOVUE-300) INJECTION 61% COMPARISON:  CT abdomen and pelvis dated 08/08/2013 and chest CT dated 11/24/2011 FINDINGS: There are multiple small pulmonary nodules measuring up to 6 mm in the right middle lobe which is similar to the study 11/24/2011. The visualized lung bases are otherwise clear. No intra-abdominal free air or free fluid. Apparent diffuse fatty infiltration of the liver with mild coarsened appearance of the liver parenchyma. The gallbladder, pancreas, spleen, adrenal glands, kidneys, visualized ureters, and urinary bladder appear unremarkable. Hysterectomy. There is scattered colonic diverticula without active inflammatory changes. There is no evidence of bowel obstruction or active inflammation. Normal appendix. 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 The origins of the celiac axis, SMA, IMA as well as the origins of the renal arteries remain patent. No portal venous gas identified. There is no adenopathy. Midline vertical anterior pelvic wall incisional scar. The abdominal wall soft tissues are otherwise unremarkable. There is osteopenia with degenerative changes of the spine. No acute fracture. IMPRESSION: Heterogeneous liver with apparent fatty infiltration. Correlation with LFTs recommended. Scattered colonic diverticula. No evidence of bowel obstruction or active inflammation. Normal appendix. Electronically Signed   By: AAnner CreteM.D.   On: 05/31/2016 02:16   Nm Myocar Multi W/spect W/wall Motion / Ef  Result Date: 06/01/2016 CLINICAL DATA:  Shortness of breath and chest pain. EXAM: MYOCARDIAL IMAGING WITH SPECT (REST AND PHARMACOLOGIC-STRESS) GATED LEFT VENTRICULAR WALL MOTION STUDY LEFT VENTRICULAR EJECTION FRACTION TECHNIQUE: Standard myocardial SPECT imaging was performed after  resting intravenous injection of 10 mCi Tc-968metrofosmin. Subsequently, intravenous infusion of Lexiscan was performed under the supervision of the Cardiology staff. At peak effect of the drug, 30 mCi Tc-9943mtrofosmin was injected intravenously and standard myocardial SPECT imaging was performed. Quantitative gated imaging was also performed to evaluate left ventricular wall motion, and estimate left ventricular ejection fraction. COMPARISON:  None. FINDINGS: Perfusion: Decreased activity in the high lateral wall on both sets of images but more pronounced on the stress images. There is also anterior wall reversibility Wall Motion: Wall motion abnormality involving the upper lateral wall. Left Ventricular Ejection Fraction: 49 % End diastolic volume 120299 End systolic volume 61 ml IMPRESSION: 1. Findings suggest high lateral wall scar with peri-infarct ischemia. There is also anterior wall ischemia. 2. Upper lateral wall motion abnormality. 3. Left ventricular ejection fraction 49% 4. Non invasive risk stratification*: High risk *2012 Appropriate Use Criteria for Coronary Revascularization Focused Update: J Am Coll Cardiol. 2013716;96(7):[id number]://[email or web address] Electronically Signed   By: P. Marijo SanesD.   On: 06/01/2016 16:34  Discharge Medications:   Medication List    STOP taking these medications   lisinopril 2.5 MG tablet Commonly known as:  PRINIVIL,ZESTRIL   naproxen 500 MG tablet Commonly known as:  NAPROSYN     TAKE these medications   acetaminophen 325 MG tablet Commonly known as:  TYLENOL Take 2 tablets (650 mg total) by mouth every 6 (six) hours as needed for mild pain, fever or headache.   amiodarone 200 MG tablet Commonly known as:  PACERONE Take 1 tablet (200 mg total) by mouth daily.   apixaban 5 MG Tabs tablet Commonly known as:  ELIQUIS Take 1 tablet (5 mg total) by mouth 2 (two) times daily.   aspirin 81 MG EC  tablet Take 1 tablet (81 mg total) by mouth daily.   atorvastatin 80 MG tablet Commonly known as:  LIPITOR TAKE 1 TABLET(80 MG) BY MOUTH DAILY   carvedilol 12.5 MG tablet Commonly known as:  COREG TAKE 1 TABLET(12.5 MG) BY MOUTH TWICE DAILY   folic acid 1 MG tablet Commonly known as:  FOLVITE Take 1 tablet (1 mg total) by mouth daily. For one month then stop.   furosemide 20 MG tablet Commonly known as:  LASIX Take 2 tablets (40 mg total) by mouth daily.   iron polysaccharides 150 MG capsule Commonly known as:  NIFEREX Take 1 capsule (150 mg total) by mouth daily. For one month then stop.   pantoprazole 40 MG tablet Commonly known as:  PROTONIX Take 1 tablet (40 mg total) by mouth daily.   potassium chloride SA 20 MEQ tablet Commonly known as:  K-DUR,KLOR-CON Take 1 tablet (20 mEq total) by mouth daily.   sertraline 50 MG tablet Commonly known as:  ZOLOFT Take 1 tablet (50 mg total) by mouth daily.   traMADol 50 MG tablet Commonly known as:  ULTRAM Take 1-2 tablets (50-100 mg total) by mouth every 6 (six) hours as needed for moderate pain.     The patient has been discharged on:   1.Beta Blocker:  Yes [ x  ]                              No   [   ]                              If No, reason:  2.Ace Inhibitor/ARB: Yes [   ]                                     No  [   x ]                                     If No, reason:Labile BP;hope to start after discharge as an outpatient  3.Statin:   Yes [ x  ]                  No  [   ]                  If No, reason:  4.Ecasa:  Yes  [ x  ]                  No   [   ]  If No, reason:  Follow Up Appointments: Follow-up Information    [facility].   Why:  You will be called to schedule an appointment  Contact information: [address] [place] [phone]       Sanda Klein, MD Follow up on 07/23/2016.   Specialty:  Cardiology Why:  Appointment  time is at 9:45 am Contact information: [address] [place] [address] [phone]        [name], MD Follow up on 07/23/2016.   Specialty:  Cardiothoracic Surgery Why:  PA/LAT CXR to be taken at [facility] which is in the same building as Dr. Everrett Coombe office) on 07/23/2016 at 11:30 am;Appointment time is at 12:00 pm Contact information: [place] [address] [phone]        Ledell Noss, MD .   Specialty:  Internal Medicine Why:  Call for a follow up appointment for further surveillance of HGA1C 6.2 (pre diabetes) Contact information: [place] [address] [phone]           Signed: Lars Pinks MPA-C 06/19/2016, 12:17 PM

## 2016-06-17 NOTE — Care Management Important Message (Signed)
Important Message  Patient Details  Name: Elizabeth [name] MRN: [id number] Date of Birth: 1955/12/07   Medicare Important Message Given:  Yes    Nathen May 06/17/2016, 10:42 AM

## 2016-06-17 NOTE — Plan of Care (Signed)
Problem: Respiratory: Goal: Levels of oxygenation will improve Outcome: Progressing Pt. transferred from ICU 06/15/16 on 5L O2 [place] and weaned to 3L on 06/16/16; no c/o's SOB.

## 2016-06-17 NOTE — Progress Notes (Addendum)
[address]       [place] [address]             [phone]        9 Days Post-Op Procedure(s) (LRB): CORONARY ARTERY BYPASS GRAFTING (CABG) X 4  With Right Greater Saphenous vein endoscopic harvesting (N/A) TRANSESOPHAGEAL ECHOCARDIOGRAM (TEE) (N/A)  Subjective: Patient slowly making progress.  Objective: Vital signs in last 24 hours: Temp:  [98 F (36.7 C)-98.4 F (36.9 C)] 98 F (36.7 C) (08/02 0459) Pulse Rate:  [62-82] 66 (08/02 0459) Cardiac Rhythm: Heart block (08/01 1956) Resp:  [18] 18 (08/02 0459) BP: (106-125)/(74-85) 125/85 (08/02 0459) SpO2:  [94 %-96 %] 94 % (08/02 0738) Weight:  [210 lb 3.2 oz (95.3 kg)] 210 lb 3.2 oz (95.3 kg) (08/02 0459)  Pre op weight 92 kg Current Weight  06/17/16 210 lb 3.2 oz (95.3 kg)       Intake/Output from previous day: 08/01 0701 - 08/02 0700 In: 480 [P.O.:480] Out: -    Physical Exam:  Cardiovascular: RRR Pulmonary: Diminished at bases Abdomen: Soft, non tender, bowel sounds present. Extremities: Bilateral lower extremity edema. Wounds: Right lower extremity wounds are clean and dry.  No erythema or signs of infection.   Lab Results: CBC:  Recent Labs  06/15/16 0422 06/16/16 0340  WBC 14.6* 11.9*  HGB 7.4* 7.5*  HCT 24.3* 24.5*  PLT 229 237   BMET:   Recent Labs  06/15/16 0422 06/16/16 0340  NA 134* 134*  K 3.7 4.2  CL 96* 97*  CO2 30 32  GLUCOSE 88 89  BUN 20 19  CREATININE 0.86 0.90  CALCIUM 8.5* 8.6*    PT/INR:  Lab Results  Component Value Date   INR 1.37 06/13/2016   INR 1.39 06/08/2016   INR 1.35 06/02/2016   ABG:  INR: Will add last result for INR, ABG once components are confirmed Will add last 4 CBG results once components are confirmed  Assessment/Plan:  1. CV - Previous a fib. Maintaining SR/first degree heart block in the 60's this am. On Amiodarone 200 mg daily.Coreg 12.5 mg bid, Digoxin 0.125 mg daily. Will discuss with Dr. Prescott Gum if should  stop Digoxin and/or decrease Coreg. 2.  Pulmonary - Now on 3 liters via [place]. Wean to room air as able.  Encourage incentive spirometer and flutter valve. 3. Volume Overload - On Lasix 80 mg daily 4.  Acute blood loss anemia - H and H stable at 7.5 and 24.5 5. Hope to discharge by end of week. She has several family members who live with her so discharge to home when ready.  ZIMMERMAN,DONIELLE MPA-C 06/17/2016,7:49 AM  Before d/c need to be seen by cardiology- d/c meds   I have seen and examined [name] and agree with the above assessment  and plan.  [name] MD [place] [phone] Office [phone] 06/17/2016 12:39 PM

## 2016-06-18 ENCOUNTER — Inpatient Hospital Stay (HOSPITAL_COMMUNITY): Payer: Medicare Other

## 2016-06-18 DIAGNOSIS — I25799 Atherosclerosis of other coronary artery bypass graft(s) with unspecified angina pectoris: Secondary | ICD-10-CM

## 2016-06-18 MED ORDER — POTASSIUM CHLORIDE CRYS ER 20 MEQ PO TBCR
20.0000 meq | EXTENDED_RELEASE_TABLET | Freq: Every day | ORAL | Status: DC
  Administered 2016-06-18 – 2016-06-19 (×2): 20 meq via ORAL
  Filled 2016-06-18 (×2): qty 1

## 2016-06-18 MED ORDER — NITROGLYCERIN 0.4 MG SL SUBL
SUBLINGUAL_TABLET | SUBLINGUAL | Status: AC
Start: 2016-06-18 — End: 2016-06-18
  Administered 2016-06-18 (×2)
  Filled 2016-06-18: qty 1

## 2016-06-18 MED ORDER — APIXABAN 5 MG PO TABS
5.0000 mg | ORAL_TABLET | Freq: Two times a day (BID) | ORAL | Status: DC
  Administered 2016-06-18 – 2016-06-19 (×3): 5 mg via ORAL
  Filled 2016-06-18 (×3): qty 1

## 2016-06-18 MED ORDER — ALTEPLASE 2 MG IJ SOLR
2.0000 mg | Freq: Once | INTRAMUSCULAR | Status: AC
Start: 2016-06-18 — End: 2016-06-18
  Administered 2016-06-18: 2 mg

## 2016-06-18 NOTE — Progress Notes (Signed)
Patient ID: CAMAY PEDIGO, female   DOB: 1956/01/10, 60 y.o.   MRN: [id number]  Medication Samples have been provided to the patient.  Drug name: Eliquis       Strength: 5 mg        Qty: 56 tabs (4 boxes)  LOT: [id number]  Exp.Date: 07/2018  Dosing instructions: Take 1 tablet by mouth twice daily  The patient has been instructed regarding the correct time, dose, and frequency of taking this medication, including desired effects and most common side effects.   Belia Heman, PharmD PGY1 Pharmacy Resident [phone] (Pager) 06/18/2016 11:49 AM

## 2016-06-18 NOTE — Progress Notes (Signed)
Pt got up to walk to the restroom this morning with NT on RA. When pt returned to chair she complained of SOB. Pt oxygen sat 80%, pt put back on 4L N/C. Pt sat up in chair to rest. Oxygen sat now back up in the 90's.  RN then returned to room to attempt to walk with pt about 20 minutes later. Pt walked outside her room using walker and stated she felt too weak to walk. Pt assisted back to chair in room. Call bell within reach. Pt encouraged to walk later. RN will continue to monitor.  Arnell Sieving, RN

## 2016-06-18 NOTE — Progress Notes (Addendum)
Patient Name: Elizabeth [name] Date of Encounter: 06/18/2016  Active Problems:   Dyslipidemia   Anxiety and depression   Essential hypertension   Coronary atherosclerosis   Ischemic cardiomyopathy   Chest pain   Paroxysmal atrial fibrillation (HCC)   CAD (coronary artery disease)   Pain in the chest   Esophageal reflux   Coronary artery disease involving native coronary artery of native heart without angina pectoris   Coronary artery disease   Length of Stay: 61  60 yo with CAD - s/p CABG  Now with PAF.   SUBJECTIVE  Seems to be doing great. VSS     CURRENT MEDS . amiodarone  200 mg Oral Daily  . apixaban  5 mg Oral BID  . aspirin EC  81 mg Oral Daily  . carvedilol  12.5 mg Oral BID WC  . fluticasone  2 spray Each Nare Daily  . folic acid  1 mg Oral Daily  . furosemide  40 mg Oral Daily  . iron polysaccharides  150 mg Oral Daily  . nitroGLYCERIN      . pantoprazole  40 mg Oral QAC breakfast  . potassium chloride  20 mEq Oral Daily  . sertraline  50 mg Oral Daily  . sodium chloride flush  3 mL Intravenous Q12H  . thiamine  100 mg Oral Daily    OBJECTIVE  No intake or output data in the 24 hours ending 06/18/16 0933 Filed Weights   06/17/16 0456 06/17/16 0459 06/18/16 0452  Weight: 210 lb 3.2 oz (95.3 kg) 210 lb 3.2 oz (95.3 kg) 208 lb (94.3 kg)    PHYSICAL EXAM Vitals:   06/17/16 2037 06/17/16 2108 06/18/16 0452 06/18/16 0837  BP:  121/68 (!) 168/80 139/86  Pulse:  (!) 57 75   Resp:  20 20   Temp:  97.9 F (36.6 C) 98.1 F (36.7 C)   TempSrc:  Oral Oral   SpO2: 96% 96% 94% 94%  Weight:   208 lb (94.3 kg)   Height:       General: Alert, oriented x3, no distress Head: no evidence of trauma, PERRL, EOMI, no exophtalmos or lid lag, no myxedema, no xanthelasma; normal ears, nose and oropharynx Neck: normal jugular venous pulsations and no hepatojugular reflux; brisk carotid pulses without delay and no carotid bruits Chest: clear  Cardiovascular:   RR , normal S1S2  Abdomen: no tenderness or distention, no masses by palpation, no abnormal pulsatility or arterial bruits, normal bowel sounds, no hepatosplenomegaly Extremities: no clubbing, cyanosis or edema;   Neurological: grossly nonfocal  LABS  CBC  Recent Labs  06/16/16 0340  WBC 11.9*  HGB 7.5*  HCT 24.5*  MCV 90.7  PLT 128   Basic Metabolic Panel  Recent Labs  06/16/16 0340  NA 134*  K 4.2  CL 97*  CO2 32  GLUCOSE 89  BUN 19  CREATININE 0.90  CALCIUM 8.6*   Liver Function Tests  Recent Labs  06/16/16 0340  AST 48*  ALT 41  ALKPHOS 373*  BILITOT 1.8*  PROT 7.1  ALBUMIN 2.6*    Radiology Studies Imaging results have been reviewed and Dg Chest 2 View  Result Date: 06/18/2016 CLINICAL DATA:  Chest pain, history of coronary artery disease with CABG on 08 June 2016 EXAM: CHEST  2 VIEW COMPARISON:  PA and lateral chest x-ray of June 16, 2016. FINDINGS: The lungs are adequately inflated. There are persistent small bilateral pleural effusions with bibasilar atelectasis or infiltrate. The cardiac  silhouette remains enlarged. The central pulmonary vascularity is prominent and there is mild cephalization of the vascular pattern. The PICC line tip projects over the distal third of the SVC. IMPRESSION: Slightly decreased density at the lung bases consistent with resolving atelectasis or pneumonia. Persistent small pleural effusions. Stable cardiomegaly and central pulmonary vascular congestion. Electronically Signed   By: David  Martinique M.D.   On: 06/18/2016 08:34    TELE NSR. PACS  ASSESSMENT AND PLAN   1. CAD s/p CABG- recovering well I think she will be fine with low dose ASA ( 11m) + the Eliquis I think there would be some benefit in ASA therapy given her new grafts.    2. AF with RVR- has converted to NSR Has not had any significant amount of atrial fib recently Agree with Amio for a month and then we can Dc.    . 3. Chronic (predominantly  diastolic) heart failure, mild LV dysfunction - EF 45-50% (preop, chronic), no overt CHF. She was on Lasix 40 mg a day as OP. Will decrease her dose to her home dose  On carvedilol 12.5 mg BID. BP is still low  92/66 at last check. Will add ARB or ACE-I as OP if BP is higher.   5. Hyperlipidemia -on Atrovastatin 20 Will need to have her LFTs followed closely as OP   6.  Anemia:   Hb ws 7.5 yesterday  She is on folate, She may benefit from Nu-iron . Will start Nu-iron 150 mg a day    Further plans per surgery    PMertie Moores MD  06/18/2016 9:33 AM    [email or web address]  [place]  [phone] [phone]: [phone] Fax: [phone]     Addendum:  The VT noted on the tele monitor is actually  artifact .    PMertie Moores MD  06/18/2016 10:10 AM    [email or web address]  [place]  [id number] [phone]: [phone] Fax: [phone]

## 2016-06-18 NOTE — Progress Notes (Addendum)
[address]       [place] [name] [address]             [phone]        10 Days Post-Op Procedure(s) (LRB): CORONARY ARTERY BYPASS GRAFTING (CABG) X 4  With Right Greater Saphenous vein endoscopic harvesting (N/A) TRANSESOPHAGEAL ECHOCARDIOGRAM (TEE) (N/A)  Subjective: Patient slowly making progress. She states she will walk this am.  Objective: Vital signs in last 24 hours: Temp:  [97.9 F (36.6 C)-98.4 F (36.9 C)] 98.1 F (36.7 C) (08/03 0452) Pulse Rate:  [57-75] 75 (08/03 0452) Cardiac Rhythm: Normal sinus rhythm (08/02 1900) Resp:  [18-20] 20 (08/03 0452) BP: (92-168)/(66-87) 168/80 (08/03 0452) SpO2:  [90 %-96 %] 94 % (08/03 0452) Weight:  [208 lb (94.3 kg)] 208 lb (94.3 kg) (08/03 0452)  Pre op weight 92 kg Current Weight  06/18/16 208 lb (94.3 kg)    Physical Exam:  Cardiovascular: RRR Pulmonary: Diminished at bases Abdomen: Soft, non tender, bowel sounds present. Extremities: Bilateral lower extremity edema. Wounds: Right lower extremity wounds are clean and dry.  No erythema or signs of infection. Lower portion of sternal wound with slight sero sanguinous ooze.  Lab Results: CBC:  Recent Labs  06/16/16 0340  WBC 11.9*  HGB 7.5*  HCT 24.5*  PLT 237   BMET:   Recent Labs  06/16/16 0340  NA 134*  K 4.2  CL 97*  CO2 32  GLUCOSE 89  BUN 19  CREATININE 0.90  CALCIUM 8.6*    PT/INR:  Lab Results  Component Value Date   INR 1.37 06/13/2016   INR 1.39 06/08/2016   INR 1.35 06/02/2016   ABG:  INR: Will add last result for INR, ABG once components are confirmed Will add last 4 CBG results once components are confirmed  Assessment/Plan:  1. CV - Previous a fib. Maintaining SR/first degree heart block in the 60's this am. On Amiodarone 200 mg daily and Coreg 12.5 mg bid. Cardiology recommends Eliquis and ok with Dr. Servando Snare so start. 2.  Pulmonary - Now on 3-4 liters via [place]. Attempting to wean. Will likely need home  oxygen at discharge.  Check CXR today.Encourage incentive spirometer and flutter valve. 3. Volume Overload - On Lasix 40 mg daily 4.  Acute blood loss anemia - H and H stable at 7.5 and 24.5. Continue Niferex 5. Will recheck LFTs in am before starting statin, as slightly elevated on 07/29.  ZIMMERMAN,DONIELLE MPA-C 06/18/2016,7:25 AM   Will resume Eliquis, patient reports that she had been on asa and eliquis but Dr Jens Som told her to take only eliquis, will check with cardiology Poss home tomorrow on o2 I have seen and examined Elizabeth [name] and agree with the above assessment  and plan.  [name] MD [facility] [phone] Office [phone] 06/18/2016 8:15 AM

## 2016-06-18 NOTE — Progress Notes (Signed)
Patient sitting up in bed, pain meds to be given. Call light within reach.

## 2016-06-18 NOTE — Evaluation (Signed)
Physical Therapy Evaluation Patient Details Name: Elizabeth [name] MRN: [id number] DOB: 07/10/1956 Today's Date: 06/18/2016   History of Present Illness  Pt s/p CABG on 7/24. PMH - tibial plateau fx, CAD, MI, HTN, obesity,   Clinical Impression  Pt admitted with above diagnosis and presents to PT with functional limitations due to deficits listed below (See PT problem list). Pt needs skilled PT to maximize independence and safety to allow discharge to home with family support. Pt does well with short distances but fatigues quickly and requires more assist with longer distances. Pt also requires O2 with RA SpO2 at 85% at rest.     Follow Up Recommendations Home health PT;Supervision/Assistance - 24 hour    Equipment Recommendations  None recommended by PT    Recommendations for Other Services       Precautions / Restrictions Precautions Precautions: Sternal;Fall      Mobility  Bed Mobility               General bed mobility comments: Pt up on EOB  Transfers Overall transfer level: Needs assistance Equipment used: 4-wheeled walker Transfers: Sit to/from Stand Sit to Stand: Min guard         General transfer comment: verbal cues for hand placement  Ambulation/Gait Ambulation/Gait assistance: Min guard;Min assist Ambulation Distance (Feet): 175 Feet Assistive device: 4-wheeled walker Gait Pattern/deviations: Step-through pattern;Decreased stride length Gait velocity: slow initially and then too fast for abilities   General Gait Details: Initially pt min guard but as distance incr pt began to fatigue and began rushing toward room and chair requiring min A. Amb on 4L of O2 with dyspnea 3/4 by end. SpO2 93%.  Stairs            Wheelchair Mobility    Modified Rankin (Stroke Patients Only)       Balance Overall balance assessment: Needs assistance Sitting-balance support: No upper extremity supported;Feet supported Sitting balance-Leahy Scale:  Good     Standing balance support: No upper extremity supported Standing balance-Leahy Scale: Fair                               Pertinent Vitals/Pain Pain Assessment: Faces Faces Pain Scale: Hurts little more Pain Location: sternum Pain Descriptors / Indicators: Operative site guarding;Grimacing Pain Intervention(s): Limited activity within patient's tolerance;Monitored during session;Repositioned    Home Living Family/patient expects to be discharged to:: Private residence Living Arrangements: Other relatives (sister and nieces) Available Help at Discharge: Family;Available 24 hours/day Type of Home: House Home Access: Stairs to enter   [employer] of Steps: 1 Home Layout: Two level;Able to live on main level with bedroom/bathroom Home Equipment: Gilford Rile - 2 wheels;Bedside commode      Prior Function Level of Independence: Independent               Hand Dominance        Extremity/Trunk Assessment   Upper Extremity Assessment: Generalized weakness           Lower Extremity Assessment: Generalized weakness         Communication   Communication: No difficulties  Cognition Arousal/Alertness: Awake/alert Behavior During Therapy: WFL for tasks assessed/performed Overall Cognitive Status: Within Functional Limits for tasks assessed                      General Comments      Exercises        Assessment/Plan  PT Assessment Patient needs continued PT services  PT Diagnosis Difficulty walking;Generalized weakness   PT Problem List Decreased strength;Decreased activity tolerance;Decreased balance;Decreased mobility;Decreased knowledge of use of DME;Decreased knowledge of precautions;Obesity  PT Treatment Interventions DME instruction;Gait training;Stair training;Functional mobility training;Therapeutic activities;Therapeutic exercise;Balance training;Patient/family education   PT Goals (Current goals can be found in the  Care Plan section) Acute Rehab PT Goals Patient Stated Goal: return home PT Goal Formulation: With patient Time For Goal Achievement: 06/25/16 Potential to Achieve Goals: Good    Frequency Min 3X/week   Barriers to discharge        Co-evaluation               End of Session Equipment Utilized During Treatment: Gait belt;Oxygen Activity Tolerance: Patient limited by fatigue Patient left: in chair;with call bell/phone within reach;with family/visitor present Nurse Communication: Mobility status         Time: 1530-1550 PT Time Calculation (min) (ACUTE ONLY): 20 min   Charges:   PT Evaluation $PT Eval Moderate Complexity: 1 Procedure     PT G Codes:        Demetrie Borge 30-Jun-2016, 4:49 PM [facility] PT [phone]

## 2016-06-18 NOTE — Discharge Instructions (Signed)
Coronary Artery Bypass Grafting, Care After Refer to this sheet in the next few weeks. These instructions provide you with information on caring for yourself after your procedure. Your health care provider may also give you more specific instructions. Your treatment has been planned according to current medical practices, but problems sometimes occur. Call your health care provider if you have any problems or questions after your procedure. WHAT TO EXPECT AFTER THE PROCEDURE Recovery from surgery will be different for everyone. Some people feel well after 3 or 4 weeks, while for others it takes longer. After your procedure, it is typical to have the following:  Nausea and a lack of appetite.   Constipation.  Weakness and fatigue.   Depression or irritability.   Pain or discomfort at your incision site. HOME CARE INSTRUCTIONS  Take medicines only as directed by your health care provider. Do not stop taking medicines or start any new medicines without first checking with your health care provider.  Take your pulse as directed by your health care provider.  Perform deep breathing as directed by your health care provider. If you were given a device called an incentive spirometer, use it to practice deep breathing several times a day. Support your chest with a pillow or your arms when you take deep breaths or cough.  Keep incision areas clean, dry, and protected. Remove or change any bandages (dressings) only as directed by your health care provider. You may have skin adhesive strips over the incision areas. Do not take the strips off. They will fall off on their own.  Check incision areas daily for any swelling, redness, or drainage.  If incisions were made in your legs, do the following:  Avoid crossing your legs.   Avoid sitting for long periods of time. Change positions every 30 minutes.   Elevate your legs when you are sitting.  Wear compression stockings as directed by your  health care provider. These stockings help keep blood clots from forming in your legs.  Take showers once your health care provider approves. Until then, only take sponge baths. Pat incisions dry. Do not rub incisions with a washcloth or towel. Do not take baths, swim, or use a hot tub until your health care provider approves.  Eat foods that are high in fiber, such as raw fruits and vegetables, whole grains, beans, and nuts. Meats should be lean cut. Avoid canned, processed, and fried foods.  Drink enough fluid to keep your urine clear or pale yellow.  Weigh yourself every day. This helps identify if you are retaining fluid that may make your heart and lungs work harder.  Rest and limit activity as directed by your health care provider. You may be instructed to:  Stop any activity at once if you have chest pain, shortness of breath, irregular heartbeats, or dizziness. Get help right away if you have any of these symptoms.  Move around frequently for short periods or take short walks as directed by your health care provider. Increase your activities gradually. You may need physical therapy or cardiac rehabilitation to help strengthen your muscles and build your endurance.  Avoid lifting, pushing, or pulling anything heavier than 10 lb (4.5 kg) for at least 6 weeks after surgery.  Do not drive until your health care provider approves.  Ask your health care provider when you may return to work.  Ask your health care provider when you may resume sexual activity.  Keep all follow-up visits as directed by your health care  provider. This is important. SEEK MEDICAL CARE IF:  You have swelling, redness, increasing pain, or drainage at the site of an incision.  You have a fever.  You have swelling in your ankles or legs.  You have pain in your legs.   You gain 2 or more pounds (0.9 kg) a day.  You are nauseous or vomit.  You have diarrhea. SEEK IMMEDIATE MEDICAL CARE IF:  You have  chest pain that goes to your jaw or arms.  You have shortness of breath.   You have a fast or irregular heartbeat.   You notice a "clicking" in your breastbone (sternum) when you move.   You have numbness or weakness in your arms or legs.  You feel dizzy or light-headed.  MAKE SURE YOU:  Understand these instructions.  Will watch your condition.  Will get help right away if you are not doing well or get worse.   This information is not intended to replace advice given to you by your health care provider. Make sure you discuss any questions you have with your health care provider.   Document Released: 05/22/2005 Document Revised: 11/23/2014 Document Reviewed: 04/11/2013 Elsevier Interactive Patient Education [employer]. It was a pleasure taking care of you, [name]. You were hospitalized for chest pain that is due to acid reflux symptoms.  - It will be important for you to take Protonix 40 mg daily to help your acid reflux. - Remember to avoid eating before bedtime. Wait at least 2 hours after your last meal before laying down.  - Below is a list of items that you should avoid eating because they can cause your acid reflux to flare up - You will be contacted by the clinic to schedule a follow up appointment   Take care, Dr. Arcelia Jew     Food Choices for Gastroesophageal Reflux Disease, Adult When you have gastroesophageal reflux disease (GERD), the foods you eat and your eating habits are very important. Choosing the right foods can help ease the discomfort of GERD. WHAT GENERAL GUIDELINES DO I NEED TO FOLLOW?  Choose fruits, vegetables, whole grains, low-fat dairy products, and low-fat meat, fish, and poultry.  Limit fats such as oils, salad dressings, butter, nuts, and avocado.  Keep a food diary to identify foods that cause symptoms.  Avoid foods that cause reflux. These may be different for different people.  Eat frequent small meals instead of three  large meals each day.  Eat your meals slowly, in a relaxed setting.  Limit fried foods.  Cook foods using methods other than frying.  Avoid drinking alcohol.  Avoid drinking large amounts of liquids with your meals.  Avoid bending over or lying down until 2-3 hours after eating. WHAT FOODS ARE NOT RECOMMENDED? The following are some foods and drinks that may worsen your symptoms: Vegetables Tomatoes. Tomato juice. Tomato and spaghetti sauce. Chili peppers. Onion and garlic. Horseradish. Fruits Oranges, grapefruit, and lemon (fruit and juice). Meats High-fat meats, fish, and poultry. This includes hot dogs, ribs, ham, sausage, salami, and bacon. Dairy Whole milk and chocolate milk. Sour cream. Cream. Butter. Ice cream. Cream cheese.  Beverages Coffee and tea, with or without caffeine. Carbonated beverages or energy drinks. Condiments Hot sauce. Barbecue sauce.  Sweets/Desserts Chocolate and cocoa. Donuts. Peppermint and spearmint. Fats and Oils High-fat foods, including [place] fries and potato chips. Other Vinegar. Strong spices, such as black pepper, white pepper, red pepper, cayenne, curry powder, cloves, ginger, and chili powder. The items  listed above may not be a complete list of foods and beverages to avoid. Contact your dietitian for more information.   This information is not intended to replace advice given to you by your health care provider. Make sure you discuss any questions you have with your health care provider.   Document Released: 11/02/2005 Document Revised: 11/23/2014 Document Reviewed: 09/06/2013 Elsevier Interactive Patient Education 2016 [place] on my medicine - ELIQUIS (apixaban)  This medication education was reviewed with me or my healthcare representative as part of my discharge preparation.   Why was Eliquis prescribed for you? Eliquis was prescribed for you to reduce the risk of a blood clot forming that can cause a  stroke if you have a medical condition called atrial fibrillation (a type of irregular heartbeat).  What do You need to know about Eliquis ? Take your Eliquis TWICE DAILY - one tablet in the morning and one tablet in the evening with or without food. If you have difficulty swallowing the tablet whole please discuss with your pharmacist how to take the medication safely.  Take Eliquis exactly as prescribed by your doctor and DO NOT stop taking Eliquis without talking to the doctor who prescribed the medication.  Stopping may increase your risk of developing a stroke.  Refill your prescription before you run out.  After discharge, you should have regular check-up appointments with your healthcare provider that is prescribing your Eliquis.  In the future your dose may need to be changed if your kidney function or weight changes by a significant amount or as you get older.  What do you do if you miss a dose? If you miss a dose, take it as soon as you remember on the same day and resume taking twice daily.  Do not take more than one dose of ELIQUIS at the same time to make up a missed dose.  Important Safety Information A possible side effect of Eliquis is bleeding. You should call your healthcare provider right away if you experience any of the following: ? Bleeding from an injury or your nose that does not stop. ? Unusual colored urine (red or dark brown) or unusual colored stools (red or black). ? Unusual bruising for unknown reasons. ? A serious fall or if you hit your head (even if there is no bleeding).  Some medicines may interact with Eliquis and might increase your risk of bleeding or clotting while on Eliquis. To help avoid this, consult your healthcare provider or pharmacist prior to using any new prescription or non-prescription medications, including herbals, vitamins, non-steroidal anti-inflammatory drugs (NSAIDs) and supplements.  This website has more information on Eliquis  (apixaban): http://www.eliquis.com/eliquis/home

## 2016-06-18 NOTE — Progress Notes (Signed)
Nurse paged me because while patient in CXR, monitor appeared to show [place]?. Patient returned to her room and had complaints of chest pain and left arm weakness. She was given 2 Nitro. Dr. Servando Snare assessed patient. She has tingling/numbness in left fingers;otherwise, motor/sensory intact. No other neuro deficits. Vital signs taken and are stable. Will monitor closely.

## 2016-06-18 NOTE — Progress Notes (Signed)
SATURATION QUALIFICATIONS: (This note is used to comply with regulatory documentation for home oxygen)  Patient Saturations on Room Air at Rest = 85%  Patient Saturations on Room Air while Ambulating = Not tested due to low SpO2 at rest on Room Air  Patient Saturations on 4 Liters of oxygen while Ambulating = 93%  Please briefly explain why patient needs home oxygen:Pt unable to maintain adequate SpO2 on room air even at rest much less with activity.  [employer] PT [phone]

## 2016-06-19 ENCOUNTER — Other Ambulatory Visit: Payer: Self-pay | Admitting: Physician Assistant

## 2016-06-19 LAB — COMPREHENSIVE METABOLIC PANEL
ALT: 41 U/L (ref 14–54)
AST: 47 U/L — ABNORMAL HIGH (ref 15–41)
Albumin: 2.7 g/dL — ABNORMAL LOW (ref 3.5–5.0)
Alkaline Phosphatase: 491 U/L — ABNORMAL HIGH (ref 38–126)
Anion gap: 9 (ref 5–15)
BUN: 12 mg/dL (ref 6–20)
CO2: 29 mmol/L (ref 22–32)
Calcium: 8.9 mg/dL (ref 8.9–10.3)
Chloride: 99 mmol/L — ABNORMAL LOW (ref 101–111)
Creatinine, Ser: 0.9 mg/dL (ref 0.44–1.00)
GFR calc Af Amer: >60 mL/min (ref >60)
GFR calc non Af Amer: >60 mL/min (ref >60)
Glucose, Bld: 97 mg/dL (ref 65–99)
Potassium: 4 mmol/L (ref 3.5–5.1)
Sodium: 137 mmol/L (ref 135–145)
Total Bilirubin: 1.6 mg/dL — ABNORMAL HIGH (ref 0.3–1.2)
Total Protein: 7.6 g/dL (ref 6.5–8.1)

## 2016-06-19 LAB — CBC
HCT: 26.4 % — ABNORMAL LOW (ref 36.0–46.0)
Hemoglobin: 7.9 g/dL — ABNORMAL LOW (ref 12.0–15.0)
MCH: 27.2 pg (ref 26.0–34.0)
MCHC: 29.9 g/dL — AB (ref 30.0–36.0)
MCV: 91 fL (ref 78.0–100.0)
Platelets: 331 10*3/uL (ref 150–400)
RBC: 2.9 MIL/uL — ABNORMAL LOW (ref 3.87–5.11)
RDW: 17.6 % — AB (ref 11.5–15.5)
WBC: 9.6 10*3/uL (ref 4.0–10.5)

## 2016-06-19 MED ORDER — AMIODARONE HCL 200 MG PO TABS: 200.0000 mg | ORAL_TABLET | Freq: Every day | ORAL | 1 refills | Status: DC

## 2016-06-19 MED ORDER — TRAMADOL HCL 50 MG PO TABS: 50.0000 mg | ORAL_TABLET | Freq: Four times a day (QID) | ORAL | 0 refills | Status: DC | PRN

## 2016-06-19 MED ORDER — POTASSIUM CHLORIDE CRYS ER 20 MEQ PO TBCR: 20.0000 meq | EXTENDED_RELEASE_TABLET | Freq: Every day | ORAL | 0 refills | Status: DC

## 2016-06-19 MED ORDER — POLYSACCHARIDE IRON COMPLEX 150 MG PO CAPS: 150.0000 mg | ORAL_CAPSULE | Freq: Every day | ORAL | 0 refills | Status: DC

## 2016-06-19 MED ORDER — FOLIC ACID 1 MG PO TABS: 1.0000 mg | ORAL_TABLET | Freq: Every day | ORAL | Status: DC

## 2016-06-19 MED ORDER — ACETAMINOPHEN 325 MG PO TABS: 650.0000 mg | ORAL_TABLET | Freq: Four times a day (QID) | ORAL | Status: DC | PRN

## 2016-06-19 MED ORDER — ASPIRIN 81 MG PO TBEC: 81.0000 mg | DELAYED_RELEASE_TABLET | Freq: Every day | ORAL | Status: DC

## 2016-06-19 NOTE — Progress Notes (Addendum)
[address]       [place] [address]             [phone]        11 Days Post-Op Procedure(s) (LRB): CORONARY ARTERY BYPASS GRAFTING (CABG) X 4  With Right Greater Saphenous vein endoscopic harvesting (N/A) TRANSESOPHAGEAL ECHOCARDIOGRAM (TEE) (N/A)  Subjective: Patient just waking up this am. She has no specific complaints and states she wants to go home. Tingling/numbness left fingers is gone.  Objective: Vital signs in last 24 hours: Temp:  [97.6 F (36.4 C)-97.7 F (36.5 C)] 97.6 F (36.4 C) (08/04 0355) Pulse Rate:  [67-72] 72 (08/04 0355) Cardiac Rhythm: Normal sinus rhythm (08/03 1900) Resp:  [16-20] 16 (08/04 0355) BP: (121-139)/(67-86) 121/67 (08/04 0355) SpO2:  [92 %-94 %] 92 % (08/04 0355) Weight:  [207 lb 11.2 oz (94.2 kg)] 207 lb 11.2 oz (94.2 kg) (08/04 0355)  Pre op weight 92 kg Current Weight  06/19/16 207 lb 11.2 oz (94.2 kg)    Physical Exam:  Cardiovascular: RRR Pulmonary: Slightly diminished at bases Abdomen: Soft, non tender, bowel sounds present. Extremities: Bilateral lower extremity edema. Wounds: Right lower extremity wounds are clean and dry.  No erythema or signs of infection. Sternal wound is clean and dry.  Lab Results: CBC:  Recent Labs  06/19/16 0340  WBC 9.6  HGB 7.9*  HCT 26.4*  PLT 331   BMET:   Recent Labs  06/19/16 0340  NA 137  K 4.0  CL 99*  CO2 29  GLUCOSE 97  BUN 12  CREATININE 0.90  CALCIUM 8.9    PT/INR:  Lab Results  Component Value Date   INR 1.37 06/13/2016   INR 1.39 06/08/2016   INR 1.35 06/02/2016   ABG:  INR: Will add last result for INR, ABG once components are confirmed Will add last 4 CBG results once components are confirmed  Assessment/Plan:  1. CV - Previous a fib. PVCs, NSVT, SR in the 60's this am. On Amiodarone 200 mg daily, and Coreg 12.5 mg bid, and Eliquis 5 mg bid. 2.  Pulmonary - Now on 2 liters via [place].Will need home oxygen at discharge.Encourage  incentive spirometer and flutter valve. 3. Volume Overload - On Lasix 40 mg daily. She took this pre op so will continue after discharge 4.  Acute blood loss anemia - H and H stable at 7.9 and 26.4. Continue Niferex 5. LFTs shows AST 47, Alk phso 491, ALT 41, and T bili 1.6. Will discuss with Dr. Servando Snare if should wait to start statin. 6. As discussed with Dr. Servando Snare, she has been weaned to 2 liters of oxygen via [place] and is stable for discharge home.  Lanea Vankirk MPA-C 06/19/2016,8:27 AM

## 2016-06-19 NOTE — Care Management Important Message (Signed)
Important Message  Patient Details  Name: Elizabeth [name] MRN: [id number] Date of Birth: 12-Jun-1956   Medicare Important Message Given:  Yes    [name] 06/19/2016, 11:02 AM

## 2016-06-19 NOTE — Progress Notes (Signed)
[phone] Pt assisted from bathroom to sitting on side of bed. Education completed with pt who voiced understanding. Discussed importance of IS and flutter valve, walking and not smoking for pulmonary status. Gave pt fake cigarette and smoking cessation handout. Plans to quit cold [place]. Pt has been walking with a walker and would recommend one for home use. Notified pt's RN. Pt did not want to watch discharge video. Reviewed heart healthy eating. Discussed CRP 2 and referring to [place] program. Graylon Good RN BSN 06/19/2016 1:52 PM

## 2016-06-19 NOTE — Care Management Note (Signed)
Case Management Note  Patient Details  Name: Elizabeth [name] MRN: [id number] Date of Birth: 1956/11/14  Subjective/Objective:      s/p CABG on 7/24               Action/Plan: Discharge Planning: AVS reviewed: NCM spoke to pt and states she lives at home with sister. Her dtr, Nat Math will be staying with her for a week. Dtr lives in [place]. Pt states she has RW and 3n1 at home. Ordered home oxygen with [facility]. Explained to pt that [facility] will deliver portable to her room and concentrator to her home.   Expected Discharge Date:  06/19/2016            Expected Discharge Plan:  Home/Self Care  In-House Referral:  NA  Discharge planning Services  CM Consult  Post Acute Care Choice:  Home Health Choice offered to:  NA  DME Arranged:  3-N-1, Walker rolling, Oxygen DME Agency:  [place]:  NA [place] Agency:     Status of Service:  Completed, signed off  If discussed at [place] of Stay Meetings, dates discussed:    Additional Comments:  Erenest Rasher, RN 06/19/2016, 1:03 PM

## 2016-06-19 NOTE — Progress Notes (Signed)
Per Josie Saunders, PA,  O2 reduced to 2L/min.  Pt sats holding at 97-98%.  Paged PA to notify, due to pending discharge.

## 2016-06-23 ENCOUNTER — Other Ambulatory Visit: Payer: Self-pay | Admitting: Internal Medicine

## 2016-06-23 DIAGNOSIS — I5032 Chronic diastolic (congestive) heart failure: Secondary | ICD-10-CM

## 2016-06-23 DIAGNOSIS — I10 Essential (primary) hypertension: Secondary | ICD-10-CM

## 2016-06-24 ENCOUNTER — Other Ambulatory Visit: Payer: Self-pay | Admitting: Physician Assistant

## 2016-06-24 ENCOUNTER — Encounter: Payer: Self-pay | Admitting: *Deleted

## 2016-06-24 ENCOUNTER — Telehealth: Payer: Self-pay | Admitting: Cardiovascular Disease

## 2016-06-24 NOTE — Telephone Encounter (Signed)
Called daughter [facility]) in regards to ordering HH-ok per MD Croitoru.   Daughter states that [place] office ordered it today and everything is already in place at this time (note in epic).     Advised to call with any questions and/or concerns.  Daughter verbalized understanding.

## 2016-06-24 NOTE — Progress Notes (Signed)
Ms. Malter' relative has called today requesting home health services for her.  She states that her nieces can not take care of her.  I called case management and was told that Ms. Corcino declined home health on discharge. I discussed this with Dr. Servando Snare and he recommended we proceed with the referral. He felt the family was not really prepared to manage what she needed for a proper recovery. I referred her to [facility] and they have accepted her. I informed Jeri Cos, her POC.

## 2016-06-24 NOTE — Telephone Encounter (Signed)
Returned daughter call [facility])- daughter wanted to know if MD Croitoru had placed orders for [facility] yet (see telephone note from earlier today).  Advised that MD is in clinic today and as soon as he responds someone will contact them to let them know.    Verbalized understanding.

## 2016-06-24 NOTE — Telephone Encounter (Signed)
F/u      Pt dtr calling to find out if pt can get HH. Please call.

## 2016-06-24 NOTE — Telephone Encounter (Signed)
OK to order home health

## 2016-06-24 NOTE — Telephone Encounter (Signed)
Returned call to daughter (EC)-daughter is wondering if MD Croitoru would order [facility] for the patient post CABG (discharged on 8/4)  Pt lives with sister and her niece but daughter lives out of town-states the sister and niece need help taking care of her mother.    PT recommendations from hospital:   Follow Up Recommendations Home health PT;Supervision/Assistance - 24 hour    Will route to MD Croitoru for orders/recommendations.  Please call daughter with further questions.

## 2016-06-24 NOTE — Telephone Encounter (Signed)
New message       Pt recently had bypass surgery.  Daughter want home health to come in and help pt.  Will Dr Sallyanne Kuster be the physician to order this?

## 2016-06-25 ENCOUNTER — Other Ambulatory Visit: Payer: Self-pay | Admitting: Physician Assistant

## 2016-06-25 ENCOUNTER — Other Ambulatory Visit: Payer: Self-pay | Admitting: *Deleted

## 2016-06-25 DIAGNOSIS — I2510 Atherosclerotic heart disease of native coronary artery without angina pectoris: Secondary | ICD-10-CM

## 2016-06-25 MED ORDER — FOLIC ACID 1 MG PO TABS: 1.0000 mg | ORAL_TABLET | Freq: Every day | ORAL | 0 refills | Status: DC

## 2016-06-26 ENCOUNTER — Other Ambulatory Visit: Payer: Self-pay | Admitting: *Deleted

## 2016-06-26 DIAGNOSIS — I480 Paroxysmal atrial fibrillation: Secondary | ICD-10-CM | POA: Diagnosis not present

## 2016-06-26 DIAGNOSIS — I25110 Atherosclerotic heart disease of native coronary artery with unstable angina pectoris: Secondary | ICD-10-CM | POA: Diagnosis not present

## 2016-06-26 DIAGNOSIS — Z48812 Encounter for surgical aftercare following surgery on the circulatory system: Secondary | ICD-10-CM | POA: Diagnosis not present

## 2016-06-26 DIAGNOSIS — I5032 Chronic diastolic (congestive) heart failure: Secondary | ICD-10-CM | POA: Diagnosis not present

## 2016-06-26 DIAGNOSIS — M797 Fibromyalgia: Secondary | ICD-10-CM | POA: Diagnosis not present

## 2016-06-26 DIAGNOSIS — I110 Hypertensive heart disease with heart failure: Secondary | ICD-10-CM | POA: Diagnosis not present

## 2016-06-26 DIAGNOSIS — G8918 Other acute postprocedural pain: Secondary | ICD-10-CM

## 2016-06-26 MED ORDER — TRAMADOL HCL 50 MG PO TABS: ORAL_TABLET | ORAL | 0 refills | Status: DC

## 2016-06-29 DIAGNOSIS — I480 Paroxysmal atrial fibrillation: Secondary | ICD-10-CM | POA: Diagnosis not present

## 2016-06-29 DIAGNOSIS — I5032 Chronic diastolic (congestive) heart failure: Secondary | ICD-10-CM | POA: Diagnosis not present

## 2016-06-29 DIAGNOSIS — M797 Fibromyalgia: Secondary | ICD-10-CM | POA: Diagnosis not present

## 2016-06-29 DIAGNOSIS — I110 Hypertensive heart disease with heart failure: Secondary | ICD-10-CM | POA: Diagnosis not present

## 2016-06-29 DIAGNOSIS — I25110 Atherosclerotic heart disease of native coronary artery with unstable angina pectoris: Secondary | ICD-10-CM | POA: Diagnosis not present

## 2016-06-29 DIAGNOSIS — Z48812 Encounter for surgical aftercare following surgery on the circulatory system: Secondary | ICD-10-CM | POA: Diagnosis not present

## 2016-06-30 ENCOUNTER — Telehealth: Payer: Self-pay

## 2016-06-30 NOTE — Telephone Encounter (Signed)
Faxed Phase 2 form to [facility] on Friday, 06/26/16.

## 2016-07-01 DIAGNOSIS — I480 Paroxysmal atrial fibrillation: Secondary | ICD-10-CM | POA: Diagnosis not present

## 2016-07-01 DIAGNOSIS — M797 Fibromyalgia: Secondary | ICD-10-CM | POA: Diagnosis not present

## 2016-07-01 DIAGNOSIS — Z48812 Encounter for surgical aftercare following surgery on the circulatory system: Secondary | ICD-10-CM | POA: Diagnosis not present

## 2016-07-01 DIAGNOSIS — I25110 Atherosclerotic heart disease of native coronary artery with unstable angina pectoris: Secondary | ICD-10-CM | POA: Diagnosis not present

## 2016-07-01 DIAGNOSIS — I5032 Chronic diastolic (congestive) heart failure: Secondary | ICD-10-CM | POA: Diagnosis not present

## 2016-07-01 DIAGNOSIS — I110 Hypertensive heart disease with heart failure: Secondary | ICD-10-CM | POA: Diagnosis not present

## 2016-07-03 DIAGNOSIS — M797 Fibromyalgia: Secondary | ICD-10-CM | POA: Diagnosis not present

## 2016-07-03 DIAGNOSIS — I480 Paroxysmal atrial fibrillation: Secondary | ICD-10-CM | POA: Diagnosis not present

## 2016-07-03 DIAGNOSIS — I110 Hypertensive heart disease with heart failure: Secondary | ICD-10-CM | POA: Diagnosis not present

## 2016-07-03 DIAGNOSIS — Z48812 Encounter for surgical aftercare following surgery on the circulatory system: Secondary | ICD-10-CM | POA: Diagnosis not present

## 2016-07-03 DIAGNOSIS — I5032 Chronic diastolic (congestive) heart failure: Secondary | ICD-10-CM | POA: Diagnosis not present

## 2016-07-03 DIAGNOSIS — I25110 Atherosclerotic heart disease of native coronary artery with unstable angina pectoris: Secondary | ICD-10-CM | POA: Diagnosis not present

## 2016-07-06 ENCOUNTER — Emergency Department (HOSPITAL_COMMUNITY)
Admission: EM | Admit: 2016-07-06 | Discharge: 2016-07-06 | Disposition: A | Discharge status: Home / Self Care | Payer: Medicare Other | Attending: Emergency Medicine | Admitting: Emergency Medicine

## 2016-07-06 ENCOUNTER — Encounter (HOSPITAL_COMMUNITY): Payer: Self-pay | Admitting: Emergency Medicine

## 2016-07-06 DIAGNOSIS — F17210 Nicotine dependence, cigarettes, uncomplicated: Secondary | ICD-10-CM | POA: Diagnosis not present

## 2016-07-06 DIAGNOSIS — Y999 Unspecified external cause status: Secondary | ICD-10-CM | POA: Diagnosis not present

## 2016-07-06 DIAGNOSIS — I252 Old myocardial infarction: Secondary | ICD-10-CM | POA: Diagnosis not present

## 2016-07-06 DIAGNOSIS — K1379 Other lesions of oral mucosa: Secondary | ICD-10-CM

## 2016-07-06 DIAGNOSIS — Y939 Activity, unspecified: Secondary | ICD-10-CM | POA: Diagnosis not present

## 2016-07-06 DIAGNOSIS — Z951 Presence of aortocoronary bypass graft: Secondary | ICD-10-CM | POA: Diagnosis not present

## 2016-07-06 DIAGNOSIS — I2510 Atherosclerotic heart disease of native coronary artery without angina pectoris: Secondary | ICD-10-CM | POA: Insufficient documentation

## 2016-07-06 DIAGNOSIS — Y929 Unspecified place or not applicable: Secondary | ICD-10-CM | POA: Insufficient documentation

## 2016-07-06 DIAGNOSIS — X58XXXA Exposure to other specified factors, initial encounter: Secondary | ICD-10-CM | POA: Diagnosis not present

## 2016-07-06 DIAGNOSIS — Z7982 Long term (current) use of aspirin: Secondary | ICD-10-CM | POA: Insufficient documentation

## 2016-07-06 DIAGNOSIS — Z8673 Personal history of transient ischemic attack (TIA), and cerebral infarction without residual deficits: Secondary | ICD-10-CM | POA: Diagnosis not present

## 2016-07-06 DIAGNOSIS — D68318 Other hemorrhagic disorder due to intrinsic circulating anticoagulants, antibodies, or inhibitors: Secondary | ICD-10-CM | POA: Diagnosis not present

## 2016-07-06 DIAGNOSIS — I110 Hypertensive heart disease with heart failure: Secondary | ICD-10-CM | POA: Insufficient documentation

## 2016-07-06 DIAGNOSIS — E876 Hypokalemia: Secondary | ICD-10-CM | POA: Insufficient documentation

## 2016-07-06 DIAGNOSIS — Z7901 Long term (current) use of anticoagulants: Secondary | ICD-10-CM | POA: Diagnosis not present

## 2016-07-06 DIAGNOSIS — I5033 Acute on chronic diastolic (congestive) heart failure: Secondary | ICD-10-CM | POA: Insufficient documentation

## 2016-07-06 DIAGNOSIS — R079 Chest pain, unspecified: Secondary | ICD-10-CM | POA: Diagnosis not present

## 2016-07-06 DIAGNOSIS — S01511A Laceration without foreign body of lip, initial encounter: Secondary | ICD-10-CM | POA: Insufficient documentation

## 2016-07-06 DIAGNOSIS — S0993XA Unspecified injury of face, initial encounter: Secondary | ICD-10-CM | POA: Diagnosis present

## 2016-07-06 LAB — COMPREHENSIVE METABOLIC PANEL
ALBUMIN: 2.8 g/dL — AB (ref 3.5–5.0)
ALT: 51 U/L (ref 14–54)
AST: 83 U/L — AB (ref 15–41)
Alkaline Phosphatase: 753 U/L — ABNORMAL HIGH (ref 38–126)
Anion gap: 7 (ref 5–15)
BUN: 8 mg/dL (ref 6–20)
CHLORIDE: 101 mmol/L (ref 101–111)
CO2: 29 mmol/L (ref 22–32)
Calcium: 9 mg/dL (ref 8.9–10.3)
Creatinine, Ser: 0.98 mg/dL (ref 0.44–1.00)
GFR calc Af Amer: >60 mL/min (ref >60)
GLUCOSE: 90 mg/dL (ref 65–99)
POTASSIUM: 3.4 mmol/L — AB (ref 3.5–5.1)
SODIUM: 137 mmol/L (ref 135–145)
Total Bilirubin: 1.2 mg/dL (ref 0.3–1.2)
Total Protein: 8.2 g/dL — ABNORMAL HIGH (ref 6.5–8.1)

## 2016-07-06 LAB — CBC WITH DIFFERENTIAL/PLATELET
BASOS ABS: 0 10*3/uL (ref 0.0–0.1)
Basophils Relative: 0 %
Eosinophils Absolute: 0.2 10*3/uL (ref 0.0–0.7)
Eosinophils Relative: 3 %
HCT: 35.1 % — ABNORMAL LOW (ref 36.0–46.0)
Hemoglobin: 10.1 g/dL — ABNORMAL LOW (ref 12.0–15.0)
LYMPHS ABS: 1.5 10*3/uL (ref 0.7–4.0)
Lymphocytes Relative: 23 %
MCH: 26.2 pg (ref 26.0–34.0)
MCHC: 28.8 g/dL — ABNORMAL LOW (ref 30.0–36.0)
MCV: 90.9 fL (ref 78.0–100.0)
MONO ABS: 0.6 10*3/uL (ref 0.1–1.0)
Monocytes Relative: 9 %
NEUTROS PCT: 65 %
Neutro Abs: 4.2 10*3/uL (ref 1.7–7.7)
PLATELETS: 357 10*3/uL (ref 150–400)
RBC: 3.86 MIL/uL — AB (ref 3.87–5.11)
RDW: 17.4 % — AB (ref 11.5–15.5)
WBC: 6.5 10*3/uL (ref 4.0–10.5)

## 2016-07-06 LAB — PROTIME-INR
INR: 1.48
PROTHROMBIN TIME: 18.1 s — AB (ref 11.4–15.2)

## 2016-07-06 LAB — TROPONIN I: TROPONIN I: 0.03 ng/mL — AB (ref <0.03)

## 2016-07-06 MED ORDER — POTASSIUM CHLORIDE CRYS ER 20 MEQ PO TBCR
40.0000 meq | EXTENDED_RELEASE_TABLET | Freq: Once | ORAL | Status: AC
  Administered 2016-07-06: 40 meq via ORAL
  Filled 2016-07-06: qty 2

## 2016-07-06 MED ORDER — TRAMADOL HCL 50 MG PO TABS
50.0000 mg | ORAL_TABLET | Freq: Once | ORAL | Status: AC
  Administered 2016-07-06: 50 mg via ORAL
  Filled 2016-07-06: qty 1

## 2016-07-06 NOTE — ED Notes (Signed)
Pt understood dc material. NAD noted.

## 2016-07-06 NOTE — Discharge Instructions (Signed)
1. Medications: usual home medications 2. Treatment: rest, drink plenty of fluids, put pressure on your lip if it starts bleeding again 3. Follow Up: Please followup with your primary doctor in 1 day for discussion of your diagnoses and further evaluation after today's visit; if you do not have a primary care doctor use the resource guide provided to find one; Please return to the ER for worsening symptoms, persistent bleeding or other concerns

## 2016-07-06 NOTE — ED Triage Notes (Signed)
Pt. reports intermittent lower lip swelling onset 2 days ago , denies injury , no bleeding at arrival .
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
## 2016-07-07 ENCOUNTER — Telehealth: Payer: Self-pay

## 2016-07-07 DIAGNOSIS — I480 Paroxysmal atrial fibrillation: Secondary | ICD-10-CM | POA: Diagnosis not present

## 2016-07-07 DIAGNOSIS — I5032 Chronic diastolic (congestive) heart failure: Secondary | ICD-10-CM | POA: Diagnosis not present

## 2016-07-07 DIAGNOSIS — Z48812 Encounter for surgical aftercare following surgery on the circulatory system: Secondary | ICD-10-CM | POA: Diagnosis not present

## 2016-07-07 DIAGNOSIS — I25110 Atherosclerotic heart disease of native coronary artery with unstable angina pectoris: Secondary | ICD-10-CM | POA: Diagnosis not present

## 2016-07-07 DIAGNOSIS — I110 Hypertensive heart disease with heart failure: Secondary | ICD-10-CM | POA: Diagnosis not present

## 2016-07-07 DIAGNOSIS — M797 Fibromyalgia: Secondary | ICD-10-CM | POA: Diagnosis not present

## 2016-07-07 NOTE — Telephone Encounter (Signed)
Needs to speak with a nurse regarding nose bleed.

## 2016-07-07 NOTE — Telephone Encounter (Signed)
Called pt - stated bleeding from nose has stopped. Wears oxygen at home; sister has connected humidifier to it today; thinks the dryness may had caused the bleeding. Also had bleeding from "bump" on her lip which has been there for years. Went to ED yesterday for this issue but stopped by the time she arrived.  Takes Baby ASA and Eliquis. INR done yesterday in ED. No bleeding from lip either at this time. I will relay message to her doctor.

## 2016-07-08 DIAGNOSIS — M797 Fibromyalgia: Secondary | ICD-10-CM | POA: Diagnosis not present

## 2016-07-08 DIAGNOSIS — I110 Hypertensive heart disease with heart failure: Secondary | ICD-10-CM | POA: Diagnosis not present

## 2016-07-08 DIAGNOSIS — I480 Paroxysmal atrial fibrillation: Secondary | ICD-10-CM | POA: Diagnosis not present

## 2016-07-08 DIAGNOSIS — Z48812 Encounter for surgical aftercare following surgery on the circulatory system: Secondary | ICD-10-CM | POA: Diagnosis not present

## 2016-07-08 DIAGNOSIS — I25110 Atherosclerotic heart disease of native coronary artery with unstable angina pectoris: Secondary | ICD-10-CM | POA: Diagnosis not present

## 2016-07-08 DIAGNOSIS — I5032 Chronic diastolic (congestive) heart failure: Secondary | ICD-10-CM | POA: Diagnosis not present

## 2016-07-09 ENCOUNTER — Other Ambulatory Visit: Payer: Self-pay | Admitting: *Deleted

## 2016-07-09 ENCOUNTER — Other Ambulatory Visit: Payer: Self-pay | Admitting: Physician Assistant

## 2016-07-09 ENCOUNTER — Other Ambulatory Visit: Payer: Self-pay | Admitting: Surgery

## 2016-07-09 DIAGNOSIS — G8918 Other acute postprocedural pain: Secondary | ICD-10-CM

## 2016-07-09 DIAGNOSIS — I110 Hypertensive heart disease with heart failure: Secondary | ICD-10-CM | POA: Diagnosis not present

## 2016-07-09 DIAGNOSIS — I5032 Chronic diastolic (congestive) heart failure: Secondary | ICD-10-CM | POA: Diagnosis not present

## 2016-07-09 DIAGNOSIS — M797 Fibromyalgia: Secondary | ICD-10-CM | POA: Diagnosis not present

## 2016-07-09 DIAGNOSIS — Z48812 Encounter for surgical aftercare following surgery on the circulatory system: Secondary | ICD-10-CM | POA: Diagnosis not present

## 2016-07-09 DIAGNOSIS — I25110 Atherosclerotic heart disease of native coronary artery with unstable angina pectoris: Secondary | ICD-10-CM | POA: Diagnosis not present

## 2016-07-09 DIAGNOSIS — I480 Paroxysmal atrial fibrillation: Secondary | ICD-10-CM | POA: Diagnosis not present

## 2016-07-09 MED ORDER — TRAMADOL HCL 50 MG PO TABS: 50.0000 mg | ORAL_TABLET | Freq: Four times a day (QID) | ORAL | 0 refills | Status: DC | PRN

## 2016-07-14 DIAGNOSIS — Z48812 Encounter for surgical aftercare following surgery on the circulatory system: Secondary | ICD-10-CM | POA: Diagnosis not present

## 2016-07-14 DIAGNOSIS — I110 Hypertensive heart disease with heart failure: Secondary | ICD-10-CM | POA: Diagnosis not present

## 2016-07-14 DIAGNOSIS — I480 Paroxysmal atrial fibrillation: Secondary | ICD-10-CM | POA: Diagnosis not present

## 2016-07-14 DIAGNOSIS — I25110 Atherosclerotic heart disease of native coronary artery with unstable angina pectoris: Secondary | ICD-10-CM | POA: Diagnosis not present

## 2016-07-14 DIAGNOSIS — I5032 Chronic diastolic (congestive) heart failure: Secondary | ICD-10-CM | POA: Diagnosis not present

## 2016-07-14 DIAGNOSIS — M797 Fibromyalgia: Secondary | ICD-10-CM | POA: Diagnosis not present

## 2016-07-15 ENCOUNTER — Ambulatory Visit: Payer: Medicare Other | Admitting: Cardiothoracic Surgery

## 2016-07-15 NOTE — Telephone Encounter (Signed)
Thank you, it appears Elizabeth [name] was last seen in our office on June 2017 and does not have an upcoming continuity clinic appointment scheduled

## 2016-07-16 DIAGNOSIS — I480 Paroxysmal atrial fibrillation: Secondary | ICD-10-CM | POA: Diagnosis not present

## 2016-07-16 DIAGNOSIS — Z48812 Encounter for surgical aftercare following surgery on the circulatory system: Secondary | ICD-10-CM | POA: Diagnosis not present

## 2016-07-16 DIAGNOSIS — I110 Hypertensive heart disease with heart failure: Secondary | ICD-10-CM | POA: Diagnosis not present

## 2016-07-16 DIAGNOSIS — I5032 Chronic diastolic (congestive) heart failure: Secondary | ICD-10-CM | POA: Diagnosis not present

## 2016-07-16 DIAGNOSIS — I25110 Atherosclerotic heart disease of native coronary artery with unstable angina pectoris: Secondary | ICD-10-CM | POA: Diagnosis not present

## 2016-07-16 DIAGNOSIS — M797 Fibromyalgia: Secondary | ICD-10-CM | POA: Diagnosis not present

## 2016-07-17 DIAGNOSIS — Z48812 Encounter for surgical aftercare following surgery on the circulatory system: Secondary | ICD-10-CM | POA: Diagnosis not present

## 2016-07-17 DIAGNOSIS — I25110 Atherosclerotic heart disease of native coronary artery with unstable angina pectoris: Secondary | ICD-10-CM | POA: Diagnosis not present

## 2016-07-17 DIAGNOSIS — M797 Fibromyalgia: Secondary | ICD-10-CM | POA: Diagnosis not present

## 2016-07-17 DIAGNOSIS — I110 Hypertensive heart disease with heart failure: Secondary | ICD-10-CM | POA: Diagnosis not present

## 2016-07-17 DIAGNOSIS — I480 Paroxysmal atrial fibrillation: Secondary | ICD-10-CM | POA: Diagnosis not present

## 2016-07-17 DIAGNOSIS — I5032 Chronic diastolic (congestive) heart failure: Secondary | ICD-10-CM | POA: Diagnosis not present

## 2016-07-21 ENCOUNTER — Other Ambulatory Visit: Payer: Self-pay | Admitting: Thoracic Surgery (Cardiothoracic Vascular Surgery)

## 2016-07-21 ENCOUNTER — Other Ambulatory Visit: Payer: Self-pay | Admitting: *Deleted

## 2016-07-21 DIAGNOSIS — G8918 Other acute postprocedural pain: Secondary | ICD-10-CM

## 2016-07-21 NOTE — Telephone Encounter (Signed)
Elizabeth [name]' pharmacy sent a request via the computer requesting a refill for her Tramadol.  Dr. Servando Snare signed a new script and I faxed it to her requesting pharmacy.

## 2016-07-22 ENCOUNTER — Other Ambulatory Visit: Payer: Self-pay | Admitting: Cardiothoracic Surgery

## 2016-07-22 DIAGNOSIS — I110 Hypertensive heart disease with heart failure: Secondary | ICD-10-CM | POA: Diagnosis not present

## 2016-07-22 DIAGNOSIS — I480 Paroxysmal atrial fibrillation: Secondary | ICD-10-CM | POA: Diagnosis not present

## 2016-07-22 DIAGNOSIS — M797 Fibromyalgia: Secondary | ICD-10-CM | POA: Diagnosis not present

## 2016-07-22 DIAGNOSIS — Z951 Presence of aortocoronary bypass graft: Secondary | ICD-10-CM

## 2016-07-22 DIAGNOSIS — I5032 Chronic diastolic (congestive) heart failure: Secondary | ICD-10-CM | POA: Diagnosis not present

## 2016-07-22 DIAGNOSIS — Z48812 Encounter for surgical aftercare following surgery on the circulatory system: Secondary | ICD-10-CM | POA: Diagnosis not present

## 2016-07-22 DIAGNOSIS — I25110 Atherosclerotic heart disease of native coronary artery with unstable angina pectoris: Secondary | ICD-10-CM | POA: Diagnosis not present

## 2016-07-23 ENCOUNTER — Ambulatory Visit (INDEPENDENT_AMBULATORY_CARE_PROVIDER_SITE_OTHER): Payer: Self-pay | Admitting: Cardiothoracic Surgery

## 2016-07-23 ENCOUNTER — Ambulatory Visit
Admission: RE | Admit: 2016-07-23 | Discharge: 2016-07-23 | Disposition: A | Discharge status: Home / Self Care | Payer: Medicare Other | Source: Ambulatory Visit | Attending: Cardiothoracic Surgery | Admitting: Cardiothoracic Surgery

## 2016-07-23 ENCOUNTER — Encounter: Payer: Self-pay | Admitting: Cardiovascular Disease

## 2016-07-23 ENCOUNTER — Encounter: Payer: Self-pay | Admitting: Cardiothoracic Surgery

## 2016-07-23 ENCOUNTER — Ambulatory Visit (INDEPENDENT_AMBULATORY_CARE_PROVIDER_SITE_OTHER): Payer: Medicare Other | Admitting: Cardiovascular Disease

## 2016-07-23 VITALS — BP 131/90 | HR 60 | Resp 18 | Ht 62.0 in | Wt 189.0 lb

## 2016-07-23 VITALS — BP 132/90 | HR 66 | Ht 62.0 in | Wt 193.8 lb

## 2016-07-23 DIAGNOSIS — I2510 Atherosclerotic heart disease of native coronary artery without angina pectoris: Secondary | ICD-10-CM | POA: Diagnosis not present

## 2016-07-23 DIAGNOSIS — I25110 Atherosclerotic heart disease of native coronary artery with unstable angina pectoris: Secondary | ICD-10-CM

## 2016-07-23 DIAGNOSIS — Z79899 Other long term (current) drug therapy: Secondary | ICD-10-CM | POA: Diagnosis not present

## 2016-07-23 DIAGNOSIS — I10 Essential (primary) hypertension: Secondary | ICD-10-CM

## 2016-07-23 DIAGNOSIS — R0602 Shortness of breath: Secondary | ICD-10-CM | POA: Diagnosis not present

## 2016-07-23 DIAGNOSIS — I480 Paroxysmal atrial fibrillation: Secondary | ICD-10-CM

## 2016-07-23 DIAGNOSIS — Z951 Presence of aortocoronary bypass graft: Secondary | ICD-10-CM

## 2016-07-23 DIAGNOSIS — I5042 Chronic combined systolic (congestive) and diastolic (congestive) heart failure: Secondary | ICD-10-CM

## 2016-07-23 DIAGNOSIS — E785 Hyperlipidemia, unspecified: Secondary | ICD-10-CM | POA: Diagnosis not present

## 2016-07-23 NOTE — Progress Notes (Signed)
I went ahead and ordered echo

## 2016-07-23 NOTE — Progress Notes (Signed)
[address]       Elizabeth [name], [id number]             [phone]      Elizabeth [name] Medical Record #[id number] Date of Birth: [birth year]  Referring: Elizabeth Klein, MD Primary Care: Elizabeth Noss, MD  Chief Complaint:   POST OP FOLLOW UP 06/08/2016 OPERATIVE REPORT PREOPERATIVE DIAGNOSIS:  Coronary occlusive disease with unstable angina. POSTOPERATIVE DIAGNOSIS:  Coronary occlusive disease with unstable angina. SURGICAL PROCEDURE:  Coronary artery bypass grafting x4 with the left internal mammary to the left anterior descending coronary artery. Sequential reverse saphenous vein graft to the obtuse marginal and distal circumflex branches and reverse saphenous vein graft to the distal right coronary artery with right thigh endo vein harvesting of the right greater saphenous vein. SURGEON:  Elizabeth Bal, MD  History of Present Illness:     Patient making progress following coronary artery bypass grafting done urgently July 24. She is slowly gaining strength. Continues on home oxygen. O2 sats is recorded or between 92 and 94. She notes that sometimes she goes without the oxygen. Physical therapy is coming to her house.      Past Medical History:  Diagnosis Date  . CAD (coronary artery disease)    NSTEMI 08/2011:  LHC 08/21/11: mLAD 60-70%, pCFX occluded, dRCA chronic occlusion with L-R collats, EF 40-45%, inf AK.  PCI:  BMS to CFX.  Marland Kitchen Depression   . Fibroids   . Fibromyalgia   . GERD (gastroesophageal reflux disease)   . History of microcytic hypochromic anemia   . HLD (hyperlipidemia)    Chol = 235, LDL = 156 (08/2010)  . Hypertension   . Insomnia   . Lupus ([place]) 2009   ANA + 10/2008, repeat ANA + (05/2009), on that visit 05/2009 following labs obtained RF <20, CRP <0.4, ANA titers 1:80,   . Myocardial infarction ([place])   . Pulmonary nodules 05/2008   noted on CXR and CT 05/2008, repeat CT 10/2008 - Stable small bilateral pulmonary  nodules measuring up to 6 m m  . Shortness of breath 08/22/2013  . Substance abuse    cocaine      History  Smoking Status  . Former Smoker  . Packs/[name]: 0.10  . Types: Cigarettes  . Quit date: 05/22/2016  Smokeless Tobacco  . Not on file    Comment: 1-2 cigs some days     History  Alcohol Use  . 4.2 oz/week  . 7 Standard drinks or equivalent per week    Comment: beer 1-2 per [name]     Allergies  Allergen Reactions  . No Known Allergies     Current Outpatient Prescriptions  Medication Sig Dispense Refill  . acetaminophen (TYLENOL) 325 MG tablet Take 2 tablets (650 mg total) by mouth every 6 (six) hours as needed for mild pain, fever or headache.    Marland Kitchen amiodarone (PACERONE) 200 MG tablet Take 1 tablet (200 mg total) by mouth daily. 30 tablet 1  . apixaban (ELIQUIS) 5 MG TABS tablet Take 1 tablet (5 mg total) by mouth 2 (two) times daily. 60 tablet 0  . aspirin EC 81 MG EC tablet Take 1 tablet (81 mg total) by mouth daily.    Marland Kitchen atorvastatin (LIPITOR) 80 MG tablet TAKE 1 TABLET(80 MG) BY MOUTH DAILY 90 tablet 3  . carvedilol (COREG) 12.5 MG tablet TAKE 1 TABLET(12.5 MG) BY MOUTH TWICE DAILY 180 tablet 3  . furosemide (LASIX)  20 MG tablet TAKE 2 TABLETS(40 MG) BY MOUTH DAILY 180 tablet 0  . pantoprazole (PROTONIX) 40 MG tablet Take 1 tablet (40 mg total) by mouth daily. (Patient not taking: Reported on 05/31/2016) 30 tablet 5  . potassium chloride SA (K-DUR,KLOR-CON) 20 MEQ tablet TAKE 1 TABLET BY MOUTH EVERY [name] 90 tablet 0  . sertraline (ZOLOFT) 50 MG tablet Take 1 tablet (50 mg total) by mouth daily. 30 tablet 2  . traMADol (ULTRAM) 50 MG tablet TAKE 1-2 TABLETS BY MOUTH EVERY 6 HOURS AS NEEDED PAIN 30 tablet 0   No current facility-administered medications for this visit.        Physical Exam: BP 131/90   Pulse 60   Resp 18   Ht _0  (1.575 m)   Wt 189 lb (85.7 kg)   LMP 12/30/1994   BMI 34.57 kg/m   General appearance: alert and cooperative Neurologic:  intact Heart: regular rate and rhythm, S1, S2 normal, no murmur, click, rub or gallop Lungs: clear to auscultation bilaterally Abdomen: soft, non-tender; bowel sounds normal; no masses,  no organomegaly Extremities: extremities normal, atraumatic, no cyanosis or edema and Homans sign is negative, no sign of DVT Wound: Sternum is stable and well-healed   Diagnostic Studies & Laboratory data:     Recent Radiology Findings:   Dg Chest 2 View  Result Date: 07/23/2016 CLINICAL DATA:  CABG.  Shortness of breath. EXAM: CHEST  2 VIEW COMPARISON:  06/18/2016. FINDINGS: Interim removal of right PICC line. Prior CABG. Cardiomegaly. Previously identified pulmonary venous congestion and interstitial prominence has cleared consistent with clearing congestive heart failure. Mild bibasilar subsegmental atelectasis and/or scarring. No pleural effusion or pneumothorax. IMPRESSION: 1.  Interim removal right PICC line. 2. Prior CABG. Interim clearing of previously identified congestive heart failure. 3.  Bibasilar subsegmental atelectasis and/or scarring. Electronically Signed   By: Elizabeth [name]  Register   On: 07/23/2016 12:29      Recent Lab Findings: Lab Results  Component Value Date   WBC 6.5 07/06/2016   HGB 10.1 (L) 07/06/2016   HCT 35.1 (L) 07/06/2016   PLT 357 07/06/2016   GLUCOSE 90 07/06/2016   CHOL 218 (H) 05/26/2016   TRIG 102 05/26/2016   HDL 49 05/26/2016   LDLDIRECT 145.2 10/29/2011   LDLCALC 149 (H) 05/26/2016   ALT 51 07/06/2016   AST 83 (H) 07/06/2016   NA 137 07/06/2016   K 3.4 (L) 07/06/2016   CL 101 07/06/2016   CREATININE 0.98 07/06/2016   BUN 8 07/06/2016   CO2 29 07/06/2016   TSH 2.779 06/10/2016   INR 1.48 07/06/2016   HGBA1C 6.2 (H) 06/07/2016      Assessment / Plan:      Patient status post urgent coronary artery bypass grafting with known left ventricular dysfunction is now progressing recently well following surgery. We will work with home nursing to wean the patient  off her oxygen as tolerated. When she is completed her course of home physical therapy she will be referred to cardiac rehabilitation I plan to see her back in one month      Elizabeth Isaac MD      [address] [place] [address] Office [phone]   Beeper [phone]  07/23/2016 12:34 PM

## 2016-07-23 NOTE — Progress Notes (Signed)
Thank you, Ed. She is coming back to see me today at 2 PM. She was a little late for her appointment this morning and didn't want to miss her appointment with you.

## 2016-07-23 NOTE — Patient Instructions (Signed)
Medication Instructions: Dr Sallyanne Kuster has recommended making the following medication changes: 1. STOP Amiodarone  Labwork: Your physician recommends that you return for lab work in 2 months - FASTING.  Testing/Procedures: 1. Echocardiogram in 2 months - Your physician has requested that you have an echocardiogram. Echocardiography is a painless test that uses sound waves to create images of your heart. It provides your doctor with information about the size and shape of your heart and how well your heart's chambers and valves are working. This procedure takes approximately one hour. There are no restrictions for this procedure. This will be performed at our [facility] location - [address].  Follow-up: Dr Sallyanne Kuster recommends that you schedule a follow-up appointment in 2 months.  If you need a refill on your cardiac medications before your next appointment, please call your pharmacy.   You have been referred to Cardiac Rehab. They will contact you to set everything up.

## 2016-07-24 NOTE — Progress Notes (Signed)
Cardiology Office Note    Date:  07/25/2016   ID:  Marcene, Laskowski Oct 25, 1956, MRN [id number]  PCP:  Ledell Noss, MD  Cardiologist:   Sanda Klein, MD   Chief Complaint  Patient presents with  . Follow-up    pt c/o chest pain    History of Present Illness:  Elizabeth [name] is a 60 y.o. female coronary artery disease now roughly 6 weeks status post bypass surgery, paroxysmal atrial fibrillation (including recurrent postoperative atrial fibrillation), hyperlipidemia, hypertension, chronic diastolic heart failure, remote history of cocaine abuse, pulmonary nodules, lupus and TIA.  She still has some chest wall postsurgical discomfort, but overall feels well. She denies exertional dyspnea and has not had angina pectoris, palpitations or any focal neurological complaints. She denies leg edema, orthopnea, PND or any serious bleeding. Her blood pressure is high today.  She underwent a cardiac cath in 08/2011 in the setting of NSTEMI and cocaine use cath showedchronic total occlusion of the proximal right coronary artery with collateral flow, long 60%-70% stenosis in the mid LAD, left circumflex coronary artery  occluded proximally with a hang-up suggesting acute lesion which received a bare-metal stent. Myoview 09/2015 showed fixed defect involving the inferolateral and anterolateral myocardium consistent with old MI, LV systolic dysfunction (32%).  In July 2017 she presented with unstable angina and nuclear stress test showed the old lateral infarction and anterior ischemia and the cath showed 70-80 percent mid LAD obstruction with high-grade stenoses in both diagonal arteries and 85-90 percent stenosis in the left circumflex and oblique marginal branch downstream of the old stent. EF 40-45%. LVEDP elevated at 22 mm Hg. On 06/08/2016 underwent four-vessel bypass surgery with Dr. Servando Snare: LIMA to LAD, sequential SVG to OM and distal circumflex and SVG to distal RCA. Postop had  moderate anemia and recurrent atrial fibrillation requiring amiodarone. She was discharged on home oxygen but is gradually weaning off. She has been receiving home physical therapy. No significant carotid lesions by duplex ultrasound in July 2017.   Past Medical History:  Diagnosis Date  . CAD (coronary artery disease)    NSTEMI 08/2011:  LHC 08/21/11: mLAD 60-70%, pCFX occluded, dRCA chronic occlusion with L-R collats, EF 40-45%, inf AK.  PCI:  BMS to CFX.  Marland Kitchen Depression   . Fibroids   . Fibromyalgia   . GERD (gastroesophageal reflux disease)   . History of microcytic hypochromic anemia   . HLD (hyperlipidemia)    Chol = 235, LDL = 156 (08/2010)  . Hypertension   . Insomnia   . Lupus ([place]) 2009   ANA + 10/2008, repeat ANA + (05/2009), on that visit 05/2009 following labs obtained RF <20, CRP <0.4, ANA titers 1:80,   . Myocardial infarction ([place])   . Pulmonary nodules 05/2008   noted on CXR and CT 05/2008, repeat CT 10/2008 - Stable small bilateral pulmonary nodules measuring up to 6 m m  . Shortness of breath 08/22/2013  . Substance abuse    cocaine     Past Surgical History:  Procedure Laterality Date  . ABDOMINAL HYSTERECTOMY  05/2003  . CARDIAC CATHETERIZATION    . CARDIAC CATHETERIZATION N/A 06/02/2016   Procedure: Left Heart Cath and Coronary Angiography;  Surgeon: Belva Crome, MD;  Location: [facility];  Service: Cardiovascular;  Laterality: N/A;  . COLONOSCOPY N/A 07/14/2013   Procedure: COLONOSCOPY;  Surgeon: Beryle Beams, MD;  Location: [facility];  Service: Endoscopy;  Laterality: N/A;  . CORONARY ARTERY BYPASS  GRAFT N/A 06/08/2016   Procedure: CORONARY ARTERY BYPASS GRAFTING (CABG) X 4  With Right Greater Saphenous vein endoscopic harvesting;  Surgeon: [name], MD;  Location: [facility];  Service: Open Heart Surgery;  Laterality: N/A;  . ORIF TIBIA PLATEAU Left 08/22/2013   Procedure: OPEN REDUCTION INTERNAL FIXATION (ORIF) TIBIAL PLATEAU;  Surgeon:  Renette Butters, MD;  Location: [facility];  Service: Orthopedics;  Laterality: Left;  . stent on chest    . TEE WITHOUT CARDIOVERSION N/A 06/08/2016   Procedure: TRANSESOPHAGEAL ECHOCARDIOGRAM (TEE);  Surgeon: [name], MD;  Location: [facility];  Service: Open Heart Surgery;  Laterality: N/A;    Current Medications: Outpatient Medications Prior to Visit  Medication Sig Dispense Refill  . acetaminophen (TYLENOL) 325 MG tablet Take 2 tablets (650 mg total) by mouth every 6 (six) hours as needed for mild pain, fever or headache.    Marland Kitchen amiodarone (PACERONE) 200 MG tablet Take 1 tablet (200 mg total) by mouth daily. 30 tablet 1  . apixaban (ELIQUIS) 5 MG TABS tablet Take 1 tablet (5 mg total) by mouth 2 (two) times daily. 60 tablet 0  . aspirin EC 81 MG EC tablet Take 1 tablet (81 mg total) by mouth daily.    Marland Kitchen atorvastatin (LIPITOR) 80 MG tablet TAKE 1 TABLET(80 MG) BY MOUTH DAILY 90 tablet 3  . carvedilol (COREG) 12.5 MG tablet TAKE 1 TABLET(12.5 MG) BY MOUTH TWICE DAILY 180 tablet 3  . furosemide (LASIX) 20 MG tablet TAKE 2 TABLETS(40 MG) BY MOUTH DAILY 180 tablet 0  . pantoprazole (PROTONIX) 40 MG tablet Take 1 tablet (40 mg total) by mouth daily. 30 tablet 5  . potassium chloride SA (K-DUR,KLOR-CON) 20 MEQ tablet TAKE 1 TABLET BY MOUTH EVERY DAY 90 tablet 0  . sertraline (ZOLOFT) 50 MG tablet Take 1 tablet (50 mg total) by mouth daily. 30 tablet 2  . traMADol (ULTRAM) 50 MG tablet TAKE 1-2 TABLETS BY MOUTH EVERY 6 HOURS AS NEEDED PAIN 30 tablet 0   No facility-administered medications prior to visit.      Allergies:   Review of patient's allergies indicates no known allergies.   Social History   Social History  . Marital status: Divorced    Spouse name: N/A  . Number of children: N/A  . Years of education: N/A   Social History Main Topics  . Smoking status: Former Smoker    Packs/day: 0.10    Types: Cigarettes    Quit date: 05/22/2016  . Smokeless tobacco: None     Comment: 1-2  cigs some days   . Alcohol use 4.2 oz/week    7 Standard drinks or equivalent per week     Comment: beer 1-2 per day  . Drug use: No     Comment: last used cocaine less than a month ago  . Sexual activity: Not Currently   Other Topics Concern  . None   Social History Narrative  . None     Family History:  The patient's family history includes Cervical cancer in her sister; Heart disease in her mother; Prostate cancer in her brother.   ROS:   Please see the history of present illness.    ROS All other systems reviewed and are negative.   PHYSICAL EXAM:   VS:  BP 132/90 (BP Location: Right Arm, Patient Position: Sitting, Cuff Size: Normal)   Pulse 66   Ht _0  (1.575 m)   Wt 193 lb 12.8 oz (87.9 kg)  LMP 12/30/1994   SpO2 92%   BMI 35.45 kg/m    GEN: Well nourished, well developed, in no acute distress  HEENT: normal  Neck: no JVD, carotid bruits, or masses Cardiac: Sternotomy scar is healing quickly, RRR; no murmurs, rubs, or gallops,no edema  Respiratory:  clear to auscultation bilaterally, normal work of breathing GI: soft, nontender, nondistended, + BS MS: no deformity or atrophy  Skin: warm and dry, no rash Neuro:  Alert and Oriented x 3, Strength and sensation are intact Psych: euthymic mood, full affect  Wt Readings from Last 3 Encounters:  07/23/16 189 lb (85.7 kg)  07/23/16 193 lb 12.8 oz (87.9 kg)  07/06/16 194 lb (88 kg)      Studies/Labs Reviewed:   EKG:  EKG is not ordered today.  The ekg ordered 8/22 demonstrates them, diffuse T-wave inversion, QTC 484 ms  Recent Labs: 03/06/2016: B Natriuretic Peptide 209.1 06/10/2016: TSH 2.779 06/11/2016: Magnesium 2.6 07/06/2016: ALT 51; BUN 8; Creatinine, Ser 0.98; Hemoglobin 10.1; Platelets 357; Potassium 3.4; Sodium 137   Lipid Panel    Component Value Date/Time   CHOL 218 (H) 05/26/2016 1015   CHOL 236 (H) 08/19/2015 0941   TRIG 102 05/26/2016 1015   HDL 49 05/26/2016 1015   HDL 49 08/19/2015 0941    CHOLHDL 4.4 05/26/2016 1015   VLDL 20 05/26/2016 1015   LDLCALC 149 (H) 05/26/2016 1015   LDLCALC 159 (H) 08/19/2015 0941   LDLDIRECT 145.2 10/29/2011 1618    Additional studies/ records that were reviewed today include:  Notes from Dr. Servando Snare    ASSESSMENT:    1. Coronary artery disease involving native coronary artery of native heart without angina pectoris   2. Chronic combined systolic and diastolic congestive heart failure (HCC)   3. Paroxysmal atrial fibrillation ([place])   4. Dyslipidemia   5. Essential hypertension   6. Medication management      PLAN:  In order of problems listed above:  1. CAD s/p CABG: Recovering well after bypass surgery. No angina pectoris. 2. CHF: Preoperatively left ventricular ejection fraction was depressed at around 45% and I suspect we will not see major improvement, since she has evidence of lateral wall scar by previous nuclear stress testing. There may be some recruitment of myocardium in the inferior wall where there was chronic total occlusion of the right coronary artery and viable myocardium. Continue treatment with carvedilol. Need to start an ACE inhibitor. 3. AFib: She had atrial fibrillation even before her bypass surgery and has a previous history of TIA. We'll discontinue amiodarone since the arrhythmia seems to have settled down clinically, but she needs to remain on lifelong anticoagulation. CHADSVasc 5 (TIA 2, gender, hypertension, CAD). 4. HLP: On high-dose statin, reevaluate in a few months. 5. HTN: Borderline elevated diastolic blood pressure today. Add ACE inhibitor. 6. History of smoking, quit since most recent hospitalization. Should never restart use of tobacco again.    Medication Adjustments/Labs and Tests Ordered: Current medicines are reviewed at length with the patient today.  Concerns regarding medicines are outlined above.  Medication changes, Labs and Tests ordered today are listed in the Patient Instructions  below. Patient Instructions  Medication Instructions: Dr Sallyanne Kuster has recommended making the following medication changes: 1. STOP Amiodarone  Labwork: Your physician recommends that you return for lab work in 2 months - FASTING.  Testing/Procedures: 1. Echocardiogram in 2 months - Your physician has requested that you have an echocardiogram. Echocardiography is a painless test that uses sound  waves to create images of your heart. It provides your doctor with information about the size and shape of your heart and how well your heart's chambers and valves are working. This procedure takes approximately one hour. There are no restrictions for this procedure. This will be performed at our [facility] location - [address].  Follow-up: Dr Sallyanne Kuster recommends that you schedule a follow-up appointment in 2 months.  If you need a refill on your cardiac medications before your next appointment, please call your pharmacy.   You have been referred to Cardiac Rehab. They will contact you to set everything up.    Signed, Sanda Klein, MD  07/25/2016 5:21 PM    [address], [place], [place]  [address] Phone: [phone]; Fax: [phone]

## 2016-07-25 ENCOUNTER — Encounter: Payer: Self-pay | Admitting: Cardiovascular Disease

## 2016-07-26 NOTE — Progress Notes (Signed)
duplicate

## 2016-07-27 ENCOUNTER — Telehealth: Payer: Self-pay | Admitting: Cardiovascular Disease

## 2016-07-27 ENCOUNTER — Other Ambulatory Visit: Payer: Self-pay | Admitting: *Deleted

## 2016-07-27 ENCOUNTER — Telehealth: Payer: Self-pay

## 2016-07-27 DIAGNOSIS — M797 Fibromyalgia: Secondary | ICD-10-CM | POA: Diagnosis not present

## 2016-07-27 DIAGNOSIS — E876 Hypokalemia: Secondary | ICD-10-CM

## 2016-07-27 DIAGNOSIS — I5032 Chronic diastolic (congestive) heart failure: Secondary | ICD-10-CM | POA: Diagnosis not present

## 2016-07-27 DIAGNOSIS — I25110 Atherosclerotic heart disease of native coronary artery with unstable angina pectoris: Secondary | ICD-10-CM | POA: Diagnosis not present

## 2016-07-27 DIAGNOSIS — I110 Hypertensive heart disease with heart failure: Secondary | ICD-10-CM | POA: Diagnosis not present

## 2016-07-27 DIAGNOSIS — Z48812 Encounter for surgical aftercare following surgery on the circulatory system: Secondary | ICD-10-CM | POA: Diagnosis not present

## 2016-07-27 DIAGNOSIS — I480 Paroxysmal atrial fibrillation: Secondary | ICD-10-CM | POA: Diagnosis not present

## 2016-07-27 MED ORDER — POTASSIUM CHLORIDE CRYS ER 20 MEQ PO TBCR: 20.0000 meq | EXTENDED_RELEASE_TABLET | Freq: Every day | ORAL | 3 refills | Status: DC

## 2016-07-27 MED ORDER — LISINOPRIL 10 MG PO TABS: 10.0000 mg | ORAL_TABLET | Freq: Every day | ORAL | 3 refills | Status: DC

## 2016-07-27 NOTE — Telephone Encounter (Signed)
New Message   *STAT* If patient is at the pharmacy, call can be transferred to refill team.   1. Which medications need to be refilled? (please list name of each medication and dose if known) Potassium chloride 28mq  2. Which pharmacy/location (including street and city if local pharmacy) is medication to be sent to? [place]  3. Do they need a 30 day or 90 day supply? Per pt daughter 946 Comments: Pt daughter call requesting to speak with RN. Pt daughter states pt mobile nurse states pt needs a refill on med to take with furosemide 24m Please call back to discuss

## 2016-07-27 NOTE — Telephone Encounter (Signed)
Spoke with daughter-daughter is going to contact [facility] for refill.  Advised to contact us with further questions or concerns.

## 2016-07-27 NOTE — Telephone Encounter (Signed)
-----  Message from Sanda Klein, MD sent at 07/25/2016  5:23 PM EDT ----- Please ask her to start lisinopril 10 mg once daily and send in Rx MCr

## 2016-07-27 NOTE — Telephone Encounter (Signed)
Called patient with recommendations. Patient verbalized understanding and agreed with plan. Rx(s) sent to pharmacy electronically.

## 2016-07-28 DIAGNOSIS — I110 Hypertensive heart disease with heart failure: Secondary | ICD-10-CM | POA: Diagnosis not present

## 2016-07-28 DIAGNOSIS — I25110 Atherosclerotic heart disease of native coronary artery with unstable angina pectoris: Secondary | ICD-10-CM | POA: Diagnosis not present

## 2016-07-28 DIAGNOSIS — M797 Fibromyalgia: Secondary | ICD-10-CM | POA: Diagnosis not present

## 2016-07-28 DIAGNOSIS — I480 Paroxysmal atrial fibrillation: Secondary | ICD-10-CM | POA: Diagnosis not present

## 2016-07-28 DIAGNOSIS — I5032 Chronic diastolic (congestive) heart failure: Secondary | ICD-10-CM | POA: Diagnosis not present

## 2016-07-28 DIAGNOSIS — Z48812 Encounter for surgical aftercare following surgery on the circulatory system: Secondary | ICD-10-CM | POA: Diagnosis not present

## 2016-07-30 ENCOUNTER — Other Ambulatory Visit: Payer: Self-pay | Admitting: Physician Assistant

## 2016-07-30 DIAGNOSIS — I2510 Atherosclerotic heart disease of native coronary artery without angina pectoris: Secondary | ICD-10-CM

## 2016-07-30 DIAGNOSIS — M797 Fibromyalgia: Secondary | ICD-10-CM | POA: Diagnosis not present

## 2016-07-30 DIAGNOSIS — I25110 Atherosclerotic heart disease of native coronary artery with unstable angina pectoris: Secondary | ICD-10-CM | POA: Diagnosis not present

## 2016-07-30 DIAGNOSIS — I110 Hypertensive heart disease with heart failure: Secondary | ICD-10-CM | POA: Diagnosis not present

## 2016-07-30 DIAGNOSIS — I480 Paroxysmal atrial fibrillation: Secondary | ICD-10-CM | POA: Diagnosis not present

## 2016-07-30 DIAGNOSIS — Z48812 Encounter for surgical aftercare following surgery on the circulatory system: Secondary | ICD-10-CM | POA: Diagnosis not present

## 2016-07-30 DIAGNOSIS — I5032 Chronic diastolic (congestive) heart failure: Secondary | ICD-10-CM | POA: Diagnosis not present

## 2016-08-03 ENCOUNTER — Other Ambulatory Visit: Payer: Self-pay | Admitting: Oncology

## 2016-08-03 DIAGNOSIS — I5032 Chronic diastolic (congestive) heart failure: Secondary | ICD-10-CM

## 2016-08-03 DIAGNOSIS — I10 Essential (primary) hypertension: Secondary | ICD-10-CM

## 2016-08-04 ENCOUNTER — Telehealth: Payer: Self-pay | Admitting: Cardiovascular Disease

## 2016-08-04 DIAGNOSIS — I110 Hypertensive heart disease with heart failure: Secondary | ICD-10-CM | POA: Diagnosis not present

## 2016-08-04 DIAGNOSIS — I25110 Atherosclerotic heart disease of native coronary artery with unstable angina pectoris: Secondary | ICD-10-CM | POA: Diagnosis not present

## 2016-08-04 DIAGNOSIS — I5032 Chronic diastolic (congestive) heart failure: Secondary | ICD-10-CM | POA: Diagnosis not present

## 2016-08-04 DIAGNOSIS — Z48812 Encounter for surgical aftercare following surgery on the circulatory system: Secondary | ICD-10-CM | POA: Diagnosis not present

## 2016-08-04 DIAGNOSIS — M797 Fibromyalgia: Secondary | ICD-10-CM | POA: Diagnosis not present

## 2016-08-04 DIAGNOSIS — I480 Paroxysmal atrial fibrillation: Secondary | ICD-10-CM | POA: Diagnosis not present

## 2016-08-04 NOTE — Telephone Encounter (Signed)
Goes to VM, left msg for patient to call. Will review symptoms w/ patient - she was c/o chest pain post-hospitalization at recent appt and has been eval'd in office by Dr. Sallyanne Kuster.

## 2016-08-04 NOTE — Telephone Encounter (Signed)
New message      Calling to let the nurse know that pt was c/o chest pain today. Home health nurse saw her today at her home.  She refused to go to the er. She has had intermittent chest pain since coming home from the hosp.  Please call pt at [phone] to get more details

## 2016-08-05 DIAGNOSIS — I5032 Chronic diastolic (congestive) heart failure: Secondary | ICD-10-CM | POA: Diagnosis not present

## 2016-08-05 DIAGNOSIS — I25110 Atherosclerotic heart disease of native coronary artery with unstable angina pectoris: Secondary | ICD-10-CM | POA: Diagnosis not present

## 2016-08-05 DIAGNOSIS — I480 Paroxysmal atrial fibrillation: Secondary | ICD-10-CM | POA: Diagnosis not present

## 2016-08-05 DIAGNOSIS — M797 Fibromyalgia: Secondary | ICD-10-CM | POA: Diagnosis not present

## 2016-08-05 DIAGNOSIS — I110 Hypertensive heart disease with heart failure: Secondary | ICD-10-CM | POA: Diagnosis not present

## 2016-08-05 DIAGNOSIS — Z48812 Encounter for surgical aftercare following surgery on the circulatory system: Secondary | ICD-10-CM | POA: Diagnosis not present

## 2016-08-05 NOTE — Telephone Encounter (Signed)
Chart forwarded to front office for an appt.Despina Hidden Cassady9/20/201712:20 PM

## 2016-08-06 ENCOUNTER — Other Ambulatory Visit (HOSPITAL_COMMUNITY): Payer: Medicare Other

## 2016-08-07 ENCOUNTER — Telehealth: Payer: Self-pay | Admitting: Cardiovascular Disease

## 2016-08-07 DIAGNOSIS — I480 Paroxysmal atrial fibrillation: Secondary | ICD-10-CM | POA: Diagnosis not present

## 2016-08-07 DIAGNOSIS — I25110 Atherosclerotic heart disease of native coronary artery with unstable angina pectoris: Secondary | ICD-10-CM | POA: Diagnosis not present

## 2016-08-07 DIAGNOSIS — I110 Hypertensive heart disease with heart failure: Secondary | ICD-10-CM | POA: Diagnosis not present

## 2016-08-07 DIAGNOSIS — M797 Fibromyalgia: Secondary | ICD-10-CM | POA: Diagnosis not present

## 2016-08-07 DIAGNOSIS — Z48812 Encounter for surgical aftercare following surgery on the circulatory system: Secondary | ICD-10-CM | POA: Diagnosis not present

## 2016-08-07 DIAGNOSIS — I5032 Chronic diastolic (congestive) heart failure: Secondary | ICD-10-CM | POA: Diagnosis not present

## 2016-08-07 NOTE — Telephone Encounter (Signed)
Returned call to patient no answer.LMTC. 

## 2016-08-07 NOTE — Telephone Encounter (Signed)
[facility] is calling because Ms. Munos blood pressure is elevated 147/102 heart rate 69 and O2 is 97 on room air .Please call   Thanks

## 2016-08-07 NOTE — Telephone Encounter (Signed)
Spoke to [employer] [employer] with [facility] she stated patient's B/P has been elevated this past week.This afternoon B/P 147/102 pulse 69.Stated she has tele monitoring and has been receiving calls about elevated B/P.Stated she has a sister in hospital and was going to see her this afternoon.She has been feeling ok and is taking medications as prescribed.Advised I will send message to Dr.Croitoru for advice.

## 2016-08-07 NOTE — Telephone Encounter (Signed)
Left message for pt to call

## 2016-08-07 NOTE — Telephone Encounter (Signed)
Spoke with pt, yesterday she has a sharpe pain that lasted all day. Today she feels a dull ache pain in her chest that will increase with movement and taking a deep breath. Reassurance given to pt, it sounds like muscle pain. She will try tylenol and call back if she continues to have problems.

## 2016-08-10 DIAGNOSIS — Z48812 Encounter for surgical aftercare following surgery on the circulatory system: Secondary | ICD-10-CM | POA: Diagnosis not present

## 2016-08-10 DIAGNOSIS — I110 Hypertensive heart disease with heart failure: Secondary | ICD-10-CM | POA: Diagnosis not present

## 2016-08-10 DIAGNOSIS — M797 Fibromyalgia: Secondary | ICD-10-CM | POA: Diagnosis not present

## 2016-08-10 DIAGNOSIS — I5032 Chronic diastolic (congestive) heart failure: Secondary | ICD-10-CM | POA: Diagnosis not present

## 2016-08-10 DIAGNOSIS — I480 Paroxysmal atrial fibrillation: Secondary | ICD-10-CM | POA: Diagnosis not present

## 2016-08-10 DIAGNOSIS — I25110 Atherosclerotic heart disease of native coronary artery with unstable angina pectoris: Secondary | ICD-10-CM | POA: Diagnosis not present

## 2016-08-10 MED ORDER — LISINOPRIL 40 MG PO TABS: 40.0000 mg | ORAL_TABLET | Freq: Every day | ORAL | 6 refills | Status: DC

## 2016-08-10 NOTE — Telephone Encounter (Signed)
Please increase lisinopril to 40 mg daily.

## 2016-08-10 NOTE — Telephone Encounter (Signed)
Returned call to [place] with [facility].Dr.Croitoru advised increase Lisinopril to 40 mg daily.Advised to continue to monitor B/P and call back if elevated.Spoke to patient she knows to increase Lisinopril 40 mg daily.

## 2016-08-11 DIAGNOSIS — I110 Hypertensive heart disease with heart failure: Secondary | ICD-10-CM | POA: Diagnosis not present

## 2016-08-11 DIAGNOSIS — M797 Fibromyalgia: Secondary | ICD-10-CM | POA: Diagnosis not present

## 2016-08-11 DIAGNOSIS — Z48812 Encounter for surgical aftercare following surgery on the circulatory system: Secondary | ICD-10-CM | POA: Diagnosis not present

## 2016-08-11 DIAGNOSIS — I25110 Atherosclerotic heart disease of native coronary artery with unstable angina pectoris: Secondary | ICD-10-CM | POA: Diagnosis not present

## 2016-08-11 DIAGNOSIS — I480 Paroxysmal atrial fibrillation: Secondary | ICD-10-CM | POA: Diagnosis not present

## 2016-08-11 DIAGNOSIS — I5032 Chronic diastolic (congestive) heart failure: Secondary | ICD-10-CM | POA: Diagnosis not present

## 2016-08-13 DIAGNOSIS — I5032 Chronic diastolic (congestive) heart failure: Secondary | ICD-10-CM | POA: Diagnosis not present

## 2016-08-13 DIAGNOSIS — I480 Paroxysmal atrial fibrillation: Secondary | ICD-10-CM | POA: Diagnosis not present

## 2016-08-13 DIAGNOSIS — I110 Hypertensive heart disease with heart failure: Secondary | ICD-10-CM | POA: Diagnosis not present

## 2016-08-13 DIAGNOSIS — I25110 Atherosclerotic heart disease of native coronary artery with unstable angina pectoris: Secondary | ICD-10-CM | POA: Diagnosis not present

## 2016-08-13 DIAGNOSIS — Z48812 Encounter for surgical aftercare following surgery on the circulatory system: Secondary | ICD-10-CM | POA: Diagnosis not present

## 2016-08-13 DIAGNOSIS — M797 Fibromyalgia: Secondary | ICD-10-CM | POA: Diagnosis not present

## 2016-08-18 ENCOUNTER — Ambulatory Visit (HOSPITAL_COMMUNITY): Payer: Medicare Other | Attending: Cardiovascular Disease

## 2016-08-18 ENCOUNTER — Other Ambulatory Visit: Payer: Self-pay

## 2016-08-18 DIAGNOSIS — I5042 Chronic combined systolic (congestive) and diastolic (congestive) heart failure: Secondary | ICD-10-CM | POA: Insufficient documentation

## 2016-08-18 DIAGNOSIS — I517 Cardiomegaly: Secondary | ICD-10-CM | POA: Insufficient documentation

## 2016-08-18 DIAGNOSIS — M797 Fibromyalgia: Secondary | ICD-10-CM | POA: Diagnosis not present

## 2016-08-18 DIAGNOSIS — I5032 Chronic diastolic (congestive) heart failure: Secondary | ICD-10-CM | POA: Diagnosis not present

## 2016-08-18 DIAGNOSIS — Z48812 Encounter for surgical aftercare following surgery on the circulatory system: Secondary | ICD-10-CM | POA: Diagnosis not present

## 2016-08-18 DIAGNOSIS — Z79899 Other long term (current) drug therapy: Secondary | ICD-10-CM | POA: Diagnosis not present

## 2016-08-18 DIAGNOSIS — I110 Hypertensive heart disease with heart failure: Secondary | ICD-10-CM | POA: Diagnosis not present

## 2016-08-18 DIAGNOSIS — I480 Paroxysmal atrial fibrillation: Secondary | ICD-10-CM | POA: Diagnosis not present

## 2016-08-18 DIAGNOSIS — I501 Left ventricular failure: Secondary | ICD-10-CM | POA: Diagnosis not present

## 2016-08-18 DIAGNOSIS — E785 Hyperlipidemia, unspecified: Secondary | ICD-10-CM | POA: Diagnosis not present

## 2016-08-18 DIAGNOSIS — I25110 Atherosclerotic heart disease of native coronary artery with unstable angina pectoris: Secondary | ICD-10-CM | POA: Diagnosis not present

## 2016-08-18 LAB — ECHOCARDIOGRAM COMPLETE
Ao-asc: 39 cm
E decel time: 204 msec
E/e' ratio: 9
FS: 20 % — AB (ref 28–44)
IV/PV OW: 1.09
LA ID, A-P, ES: 45 mm
LA diam index: 2.38 cm/m2
LA vol A4C: 72.1 ml
LA vol index: 33.6 mL/m2
LAVOL: 63.5 mL
LEFT ATRIUM END SYS DIAM: 45 mm
LV TDI E'LATERAL: 5.98
LV e' LATERAL: 5.98 cm/s
LVEEAVG: 9
LVEEMED: 9
LVOT VTI: 15.9 cm
LVOT area: 3.8 cm2
LVOTD: 22 mm
LVOTPV: 72.6 cm/s
LVOTSV: 60 mL
MV Dec: 204
MV pk A vel: 72.7 m/s
MV pk E vel: 53.8 m/s
PW: 12.2 mm — AB (ref 0.6–1.1)
RV LATERAL S' VELOCITY: 7.76 cm/s
RV sys press: 45 mmHg
Reg peak vel: 303 cm/s
TDI e' medial: 4.33
TRMAXVEL: 303 cm/s

## 2016-08-19 DIAGNOSIS — I5032 Chronic diastolic (congestive) heart failure: Secondary | ICD-10-CM | POA: Diagnosis not present

## 2016-08-19 DIAGNOSIS — Z48812 Encounter for surgical aftercare following surgery on the circulatory system: Secondary | ICD-10-CM | POA: Diagnosis not present

## 2016-08-19 DIAGNOSIS — I110 Hypertensive heart disease with heart failure: Secondary | ICD-10-CM | POA: Diagnosis not present

## 2016-08-19 DIAGNOSIS — I25110 Atherosclerotic heart disease of native coronary artery with unstable angina pectoris: Secondary | ICD-10-CM | POA: Diagnosis not present

## 2016-08-19 DIAGNOSIS — I480 Paroxysmal atrial fibrillation: Secondary | ICD-10-CM | POA: Diagnosis not present

## 2016-08-19 DIAGNOSIS — M797 Fibromyalgia: Secondary | ICD-10-CM | POA: Diagnosis not present

## 2016-08-19 LAB — COMPREHENSIVE METABOLIC PANEL
ALBUMIN: 3.7 g/dL (ref 3.6–5.1)
ALK PHOS: 408 U/L — AB (ref 33–130)
ALT: 22 U/L (ref 6–29)
AST: 29 U/L (ref 10–35)
BILIRUBIN TOTAL: 0.6 mg/dL (ref 0.2–1.2)
BUN: 12 mg/dL (ref 7–25)
CALCIUM: 9.3 mg/dL (ref 8.6–10.4)
CO2: 26 mmol/L (ref 20–31)
Chloride: 102 mmol/L (ref 98–110)
Creat: 0.77 mg/dL (ref 0.50–1.05)
GLUCOSE: 87 mg/dL (ref 65–99)
POTASSIUM: 3.5 mmol/L (ref 3.5–5.3)
Sodium: 138 mmol/L (ref 135–146)
Total Protein: 8.5 g/dL — ABNORMAL HIGH (ref 6.1–8.1)

## 2016-08-19 LAB — LIPID PANEL
CHOL/HDL RATIO: 4 ratio (ref <=5.0)
CHOLESTEROL: 163 mg/dL (ref 125–200)
HDL: 41 mg/dL — ABNORMAL LOW (ref >=46)
LDL Cholesterol: 100 mg/dL (ref <130)
TRIGLYCERIDES: 108 mg/dL (ref <150)
VLDL: 22 mg/dL (ref <30)

## 2016-08-21 ENCOUNTER — Telehealth: Payer: Self-pay

## 2016-08-21 DIAGNOSIS — I110 Hypertensive heart disease with heart failure: Secondary | ICD-10-CM | POA: Diagnosis not present

## 2016-08-21 DIAGNOSIS — E785 Hyperlipidemia, unspecified: Secondary | ICD-10-CM

## 2016-08-21 DIAGNOSIS — Z48812 Encounter for surgical aftercare following surgery on the circulatory system: Secondary | ICD-10-CM | POA: Diagnosis not present

## 2016-08-21 DIAGNOSIS — I5032 Chronic diastolic (congestive) heart failure: Secondary | ICD-10-CM | POA: Diagnosis not present

## 2016-08-21 DIAGNOSIS — I25110 Atherosclerotic heart disease of native coronary artery with unstable angina pectoris: Secondary | ICD-10-CM | POA: Diagnosis not present

## 2016-08-21 DIAGNOSIS — M797 Fibromyalgia: Secondary | ICD-10-CM | POA: Diagnosis not present

## 2016-08-21 DIAGNOSIS — I480 Paroxysmal atrial fibrillation: Secondary | ICD-10-CM | POA: Diagnosis not present

## 2016-08-21 NOTE — Telephone Encounter (Signed)
-----  Message from Sanda Klein, MD sent at 08/19/2016  8:03 AM EDT ----- Labs generally okay. Cholesterol is much improved, although it has not dropped as low as I would have expected and hoped. Target LDL 70 or less. Please make sure she is taking the atorvastatin every day. Plan to recheck cholesterol in another 3 months.

## 2016-08-26 ENCOUNTER — Other Ambulatory Visit: Payer: Self-pay | Admitting: Cardiothoracic Surgery

## 2016-08-26 DIAGNOSIS — R911 Solitary pulmonary nodule: Secondary | ICD-10-CM

## 2016-08-27 ENCOUNTER — Ambulatory Visit: Payer: Self-pay | Admitting: Cardiothoracic Surgery

## 2016-08-31 ENCOUNTER — Telehealth: Payer: Self-pay

## 2016-08-31 NOTE — Telephone Encounter (Signed)
Sybol Elizabeth [name] is a 60 y.o. female who was contacted via telephone for monitoring of apixaban (Eliquis) therapy.    ASSESSMENT Indication(s): atrial fibrillation  Duration: Discontinued with provider advice per patient  Labs:    Component Value Date/Time   AST 29 08/18/2016 1156   ALT 22 08/18/2016 1156   NA 138 08/18/2016 1156   K 3.5 08/18/2016 1156   CL 102 08/18/2016 1156   CO2 26 08/18/2016 1156   GLUCOSE 87 08/18/2016 1156   HGBA1C 6.2 (H) 06/07/2016 2040   BUN 12 08/18/2016 1156   CREATININE 0.77 08/18/2016 1156   CALCIUM 9.3 08/18/2016 1156   GFRNONAA >60 07/06/2016 0123   GFRNONAA 89 05/30/2015 1040   GFRAA >60 07/06/2016 0123   GFRAA >89 05/30/2015 1040   WBC 6.5 07/06/2016 0123   HGB 10.1 (L) 07/06/2016 0123   HCT 35.1 (L) 07/06/2016 0123   HCT 40.8 03/11/2016 1034   PLT 357 07/06/2016 0123   PLT 268 03/11/2016 1034    apixaban (Eliquis) Dose:  62m BID   Safety: Patient has not had recent bleeding/thromboembolic events. Patient reports no recent signs or symptoms of bleeding, no signs of symptoms of thromboembolism. Medication changes: yes (d/c Eliquis and is only taking aspirin 857mnow).  Renal/hepatic/drug interaction concerns: no.  Adherence: Patient reports no known adherence challenges.  Patient does correctly recite the dose. Contacted pharmacy and records indicate refills are consistent. Refill dates 07/15/16 (30 day supply), 03/06/16 (30 day supply).  Patient Instructions: Patient advised to contact clinic or seek medical attention if signs/symptoms of bleeding or thromboembolism occur. Patient verbalized understanding by repeating back information.  Follow-up Recommended labs to consider: CBC . Next appointment : 10/07/16  FrDarcella CheshireharmD Candidate  08/31/2016, 2:51 PM

## 2016-09-10 ENCOUNTER — Telehealth: Payer: Self-pay

## 2016-09-10 NOTE — Telephone Encounter (Signed)
Faxed case communication in regards to telephone encounter from 08/07/2016

## 2016-09-16 ENCOUNTER — Ambulatory Visit: Payer: Medicare Other | Admitting: Cardiovascular Disease

## 2016-09-16 ENCOUNTER — Other Ambulatory Visit: Payer: Self-pay | Admitting: Oncology

## 2016-09-16 DIAGNOSIS — I5032 Chronic diastolic (congestive) heart failure: Secondary | ICD-10-CM

## 2016-09-16 DIAGNOSIS — I10 Essential (primary) hypertension: Secondary | ICD-10-CM

## 2016-09-17 ENCOUNTER — Ambulatory Visit: Payer: Self-pay | Admitting: Cardiothoracic Surgery

## 2016-09-18 NOTE — Telephone Encounter (Signed)
Patient was contacted with Darcella Cheshire, PharmD candidate. I agree with the assessment and plan of care documented.

## 2016-09-22 ENCOUNTER — Ambulatory Visit
Admission: RE | Admit: 2016-09-22 | Discharge: 2016-09-22 | Disposition: A | Discharge status: Home / Self Care | Payer: Medicare Other | Source: Ambulatory Visit | Attending: Cardiothoracic Surgery | Admitting: Cardiothoracic Surgery

## 2016-09-22 ENCOUNTER — Ambulatory Visit: Payer: Medicare Other | Admitting: Cardiothoracic Surgery

## 2016-09-22 ENCOUNTER — Other Ambulatory Visit: Payer: Self-pay | Admitting: Cardiothoracic Surgery

## 2016-09-22 DIAGNOSIS — R911 Solitary pulmonary nodule: Secondary | ICD-10-CM

## 2016-09-22 DIAGNOSIS — Z951 Presence of aortocoronary bypass graft: Secondary | ICD-10-CM

## 2016-09-22 DIAGNOSIS — R0602 Shortness of breath: Secondary | ICD-10-CM

## 2016-09-22 DIAGNOSIS — Z7901 Long term (current) use of anticoagulants: Secondary | ICD-10-CM | POA: Insufficient documentation

## 2016-09-22 NOTE — Progress Notes (Signed)
Cardiology Office Note    Date:  09/23/2016   ID:  Elizabeth, [name] 10-Aug-1956, MRN [id number]  PCP:  Ledell Noss, MD  Cardiologist:   Sanda Klein, MD   Chief Complaint  Patient presents with  . Follow-up    pt c/o coughing thinks its from her lisinopril    History of Present Illness:  Elizabeth [name] is a 60 y.o. female coronary artery disease now roughly 3 months after CABG with hyperlipidemia, hypertension, chronic diastolic heart failure, remote history of cocaine abuse, pulmonary nodules, lupus and TIA.  She erroneously went to [employer] location initially and had to rush across town. She is a little flustered and her blood pressure is elevated. She has smoked only one cigarette since her bypass surgery and he made her feel ill. She swears she will never smoke again. She has not had any palpitations since hospital discharge. She had postoperative atrial fibrillation but also had atrial fibrillation before the bypass procedure.  She has a keloid scar towards the cranial segment of the sternotomy and some chest wall postsurgical discomfort, but overall feels well. She denies exertional dyspnea and has not had angina pectoris, palpitations or any focal neurological complaints. She denies leg edema, orthopnea, PND or any serious bleeding. Her blood pressure is high today.  She has a persistent cough that occurs all day long including at night but is not productive.  She underwent a cardiac cath in 08/2011 in the setting of NSTEMI and cocaine use cath showedchronic total occlusion of the proximal right coronary artery with collateral flow, long 60%-70% stenosis in the mid LAD, left circumflex coronary artery  occluded proximally with a hang-up suggesting acute lesion which received a bare-metal stent. Myoview 09/2015 showed fixed defect involving the inferolateral and anterolateral myocardium consistent with old MI, LV systolic dysfunction (27%).  In July 2017 she  presented with unstable angina and nuclear stress test showed the old lateral infarction and anterior ischemia and the cath showed 70-80 percent mid LAD obstruction with high-grade stenoses in both diagonal arteries and 85-90 percent stenosis in the left circumflex and oblique marginal branch downstream of the old stent. EF 40-45%. LVEDP elevated at 22 mm Hg. On 06/08/2016 underwent four-vessel bypass surgery with Dr. Servando Snare: LIMA to LAD, sequential SVG to OM and distal circumflex and SVG to distal RCA. Postop had moderate anemia and recurrent atrial fibrillation requiring amiodarone. She was discharged on home oxygen but is gradually weaning off. She has been receiving home physical therapy. No significant carotid lesions by duplex ultrasound in July 2017.   Past Medical History:  Diagnosis Date  . CAD (coronary artery disease)    NSTEMI 08/2011:  LHC 08/21/11: mLAD 60-70%, pCFX occluded, dRCA chronic occlusion with L-R collats, EF 40-45%, inf AK.  PCI:  BMS to CFX.  Marland Kitchen Depression   . Fibroids   . Fibromyalgia   . GERD (gastroesophageal reflux disease)   . History of microcytic hypochromic anemia   . HLD (hyperlipidemia)    Chol = 235, LDL = 156 (08/2010)  . Hypertension   . Insomnia   . Lupus 2009   ANA + 10/2008, repeat ANA + (05/2009), on that visit 05/2009 following labs obtained RF <20, CRP <0.4, ANA titers 1:80,   . Myocardial infarction   . Pulmonary nodules 05/2008   noted on CXR and CT 05/2008, repeat CT 10/2008 - Stable small bilateral pulmonary nodules measuring up to 6 m m  . Shortness of breath 08/22/2013  .  Substance abuse    cocaine     Past Surgical History:  Procedure Laterality Date  . ABDOMINAL HYSTERECTOMY  05/2003  . CARDIAC CATHETERIZATION    . CARDIAC CATHETERIZATION N/A 06/02/2016   Procedure: Left Heart Cath and Coronary Angiography;  Surgeon: Belva Crome, MD;  Location: [facility];  Service: Cardiovascular;  Laterality: N/A;  . COLONOSCOPY N/A  07/14/2013   Procedure: COLONOSCOPY;  Surgeon: Beryle Beams, MD;  Location: [facility];  Service: Endoscopy;  Laterality: N/A;  . CORONARY ARTERY BYPASS GRAFT N/A 06/08/2016   Procedure: CORONARY ARTERY BYPASS GRAFTING (CABG) X 4  With Right Greater Saphenous vein endoscopic harvesting;  Surgeon: [name], MD;  Location: [facility];  Service: Open Heart Surgery;  Laterality: N/A;  . ORIF TIBIA PLATEAU Left 08/22/2013   Procedure: OPEN REDUCTION INTERNAL FIXATION (ORIF) TIBIAL PLATEAU;  Surgeon: Renette Butters, MD;  Location: [facility];  Service: Orthopedics;  Laterality: Left;  . stent on chest    . TEE WITHOUT CARDIOVERSION N/A 06/08/2016   Procedure: TRANSESOPHAGEAL ECHOCARDIOGRAM (TEE);  Surgeon: [name], MD;  Location: [facility];  Service: Open Heart Surgery;  Laterality: N/A;    Current Medications: Outpatient Medications Prior to Visit  Medication Sig Dispense Refill  . acetaminophen (TYLENOL) 325 MG tablet Take 2 tablets (650 mg total) by mouth every 6 (six) hours as needed for mild pain, fever or headache.    Marland Kitchen aspirin EC 81 MG EC tablet Take 1 tablet (81 mg total) by mouth daily.    Marland Kitchen atorvastatin (LIPITOR) 80 MG tablet TAKE 1 TABLET(80 MG) BY MOUTH DAILY 90 tablet 3  . carvedilol (COREG) 12.5 MG tablet TAKE 1 TABLET(12.5 MG) BY MOUTH TWICE DAILY 180 tablet 3  . furosemide (LASIX) 20 MG tablet TAKE 2 TABLETS(40 MG) BY MOUTH DAILY 180 tablet 0  . pantoprazole (PROTONIX) 40 MG tablet Take 1 tablet (40 mg total) by mouth daily. 30 tablet 5  . potassium chloride SA (K-DUR,KLOR-CON) 20 MEQ tablet Take 1 tablet (20 mEq total) by mouth daily. 90 tablet 3  . amiodarone (PACERONE) 200 MG tablet Take 1 tablet (200 mg total) by mouth daily. 30 tablet 1  . lisinopril (PRINIVIL,ZESTRIL) 40 MG tablet Take 1 tablet (40 mg total) by mouth daily. 30 tablet 6  . apixaban (ELIQUIS) 5 MG TABS tablet Take 1 tablet (5 mg total) by mouth 2 (two) times daily. (Patient not taking: Reported on  09/23/2016) 60 tablet 0  . sertraline (ZOLOFT) 50 MG tablet Take 1 tablet (50 mg total) by mouth daily. (Patient not taking: Reported on 09/23/2016) 30 tablet 2  . traMADol (ULTRAM) 50 MG tablet TAKE 1-2 TABLETS BY MOUTH EVERY 6 HOURS AS NEEDED PAIN 30 tablet 0   No facility-administered medications prior to visit.      Allergies:   Patient has no known allergies.   Social History   Social History  . Marital status: Divorced    Spouse name: N/A  . Number of children: N/A  . Years of education: N/A   Social History Main Topics  . Smoking status: Former Smoker    Packs/day: 0.10    Types: Cigarettes    Quit date: 05/22/2016  . Smokeless tobacco: Not on file     Comment: 1-2 cigs some days   . Alcohol use 4.2 oz/week    7 Standard drinks or equivalent per week     Comment: beer 1-2 per day  . Drug use: No  Comment: last used cocaine less than a month ago  . Sexual activity: Not Currently   Other Topics Concern  . Not on file   Social History Narrative  . No narrative on file     Family History:  The patient's family history includes Cervical cancer in her sister; Heart disease in her mother; Prostate cancer in her brother.   ROS:   Please see the history of present illness.    ROS All other systems reviewed and are negative.   PHYSICAL EXAM:   VS:  BP (!) 163/91 (BP Location: Right Arm, Patient Position: Sitting, Cuff Size: Large)   Pulse 84   Ht _0  (1.575 m)   Wt 201 lb 9.6 oz (91.4 kg)   LMP 12/30/1994   SpO2 97%   BMI 36.87 kg/m    GEN: Well nourished, well developed, in no acute distress  HEENT: normal  Neck: no JVD, carotid bruits, or masses Cardiac: Sternotomy scar is healed with some keloid formation at the upper end, RRR; no murmurs, rubs, or gallops,no edema  Respiratory:  clear to auscultation bilaterally, normal work of breathing GI: soft, nontender, nondistended, + BS MS: no deformity or atrophy  Skin: warm and dry, no rash Neuro:  Alert and  Oriented x 3, Strength and sensation are intact Psych: euthymic mood, full affect  Wt Readings from Last 3 Encounters:  09/23/16 201 lb 9.6 oz (91.4 kg)  07/23/16 189 lb (85.7 kg)  07/23/16 193 lb 12.8 oz (87.9 kg)      Studies/Labs Reviewed:   EKG:  EKG is not ordered today.  The ekg ordered 8/22 demonstrates them, diffuse T-wave inversion, QTC 484 ms  Recent Labs: 03/06/2016: B Natriuretic Peptide 209.1 06/10/2016: TSH 2.779 06/11/2016: Magnesium 2.6 07/06/2016: Hemoglobin 10.1; Platelets 357 08/18/2016: ALT 22; BUN 12; Creat 0.77; Potassium 3.5; Sodium 138   Lipid Panel    Component Value Date/Time   CHOL 163 08/18/2016 1156   CHOL 236 (H) 08/19/2015 0941   TRIG 108 08/18/2016 1156   HDL 41 (L) 08/18/2016 1156   HDL 49 08/19/2015 0941   CHOLHDL 4.0 08/18/2016 1156   VLDL 22 08/18/2016 1156   LDLCALC 100 08/18/2016 1156   LDLCALC 159 (H) 08/19/2015 0941   LDLDIRECT 145.2 10/29/2011 1618    Additional studies/ records that were reviewed today include:  Notes from Dr. Servando Snare    ASSESSMENT:    1. Coronary artery disease involving native coronary artery of native heart with unstable angina pectoris ([place])   2. Chronic combined systolic and diastolic congestive heart failure (HCC)   3. Paroxysmal atrial fibrillation ([place])   4. Long term current use of anticoagulant   5. Dyslipidemia   6. Essential hypertension      PLAN:  In order of problems listed above:  1. CAD s/p CABG: Recovering well after bypass surgery. No angina pectoris. 2. CHF: Preoperatively left ventricular ejection fraction was depressed at around 45% andI suspected there was no  major improvement on the postoperative echo, since she has evidence of lateral wall scar by previous nuclear stress testing. Continue treatment with carvedilol. Need to switch from ACE inhibitor to ARB due to cough. 3. AFib: She had atrial fibrillation even before her bypass surgery and has a previous history of TIA. We'll  discontinue amiodarone since the arrhythmia seems to have settled down clinically. 4. Anticoagulation: she needs to remain on lifelong anticoagulation. CHADSVasc 5 (TIA 2, gender, hypertension, CAD). Was on Eliquis - need to make sur  estill taking it. 5. HLP: On high-dose statin, reevaluate in a few months. 6. HTN:  elevated diastolic blood pressure today. She was quite anxious after having to run from one office location to the next. Will reevaluate after switching to ARB. Consider Bidil if still high.  7. History of smoking, quit since most recent hospitalization. Should never restart use of tobacco again.    Medication Adjustments/Labs and Tests Ordered: Current medicines are reviewed at length with the patient today.  Concerns regarding medicines are outlined above.  Medication changes, Labs and Tests ordered today are listed in the Patient Instructions below. Patient Instructions  Dr Sallyanne Kuster has recommended making the following medication changes: 1. STOP Amiodarone 2. STOP Lisinopril 3. START Valsartan 320 mg - take 1 tablet by mouth daily  Your physician recommends that you return for lab work TODAY.  Dr Sallyanne Kuster recommends that you schedule a follow-up appointment in 3 months.  If you need a refill on your cardiac medications before your next appointment, please call your pharmacy.    Signed, Sanda Klein, MD  09/23/2016 1:48 PM    [facility] [address], [place], [place]  [address] Phone: [phone]; Fax: [phone]

## 2016-09-23 ENCOUNTER — Ambulatory Visit (INDEPENDENT_AMBULATORY_CARE_PROVIDER_SITE_OTHER): Payer: Medicare Other | Admitting: Cardiovascular Disease

## 2016-09-23 ENCOUNTER — Encounter: Payer: Self-pay | Admitting: Cardiovascular Disease

## 2016-09-23 VITALS — BP 163/91 | HR 84 | Ht 62.0 in | Wt 201.6 lb

## 2016-09-23 DIAGNOSIS — I5042 Chronic combined systolic (congestive) and diastolic (congestive) heart failure: Secondary | ICD-10-CM | POA: Diagnosis not present

## 2016-09-23 DIAGNOSIS — I25110 Atherosclerotic heart disease of native coronary artery with unstable angina pectoris: Secondary | ICD-10-CM

## 2016-09-23 DIAGNOSIS — I480 Paroxysmal atrial fibrillation: Secondary | ICD-10-CM

## 2016-09-23 DIAGNOSIS — I2510 Atherosclerotic heart disease of native coronary artery without angina pectoris: Secondary | ICD-10-CM | POA: Diagnosis not present

## 2016-09-23 DIAGNOSIS — Z7901 Long term (current) use of anticoagulants: Secondary | ICD-10-CM | POA: Diagnosis not present

## 2016-09-23 DIAGNOSIS — I200 Unstable angina: Secondary | ICD-10-CM

## 2016-09-23 DIAGNOSIS — E785 Hyperlipidemia, unspecified: Secondary | ICD-10-CM

## 2016-09-23 DIAGNOSIS — I10 Essential (primary) hypertension: Secondary | ICD-10-CM

## 2016-09-23 MED ORDER — VALSARTAN 160 MG PO TABS: 160.0000 mg | ORAL_TABLET | Freq: Every day | ORAL | 3 refills | Status: DC

## 2016-09-23 MED ORDER — VALSARTAN 320 MG PO TABS: 320.0000 mg | ORAL_TABLET | Freq: Every day | ORAL | 11 refills | Status: DC

## 2016-09-23 NOTE — Patient Instructions (Addendum)
Dr Sallyanne Kuster has recommended making the following medication changes: 1. STOP Amiodarone 2. STOP Lisinopril 3. START Valsartan 320 mg - take 1 tablet by mouth daily  Your physician recommends that you return for lab work TODAY.  Dr Sallyanne Kuster recommends that you schedule a follow-up appointment in 3 months.  If you need a refill on your cardiac medications before your next appointment, please call your pharmacy.

## 2016-09-24 ENCOUNTER — Encounter: Payer: Self-pay | Admitting: *Deleted

## 2016-09-24 ENCOUNTER — Telehealth: Payer: Self-pay | Admitting: *Deleted

## 2016-09-24 DIAGNOSIS — R7401 Elevation of levels of liver transaminase levels: Secondary | ICD-10-CM | POA: Insufficient documentation

## 2016-09-24 DIAGNOSIS — E785 Hyperlipidemia, unspecified: Secondary | ICD-10-CM

## 2016-09-24 DIAGNOSIS — R748 Abnormal levels of other serum enzymes: Secondary | ICD-10-CM | POA: Insufficient documentation

## 2016-09-24 DIAGNOSIS — R740 Nonspecific elevation of levels of transaminase and lactic acid dehydrogenase [LDH]: Secondary | ICD-10-CM

## 2016-09-24 LAB — COMPREHENSIVE METABOLIC PANEL
ALBUMIN: 4.1 g/dL (ref 3.6–5.1)
ALT: 31 U/L — ABNORMAL HIGH (ref 6–29)
AST: 36 U/L — ABNORMAL HIGH (ref 10–35)
Alkaline Phosphatase: 388 U/L — ABNORMAL HIGH (ref 33–130)
BUN: 10 mg/dL (ref 7–25)
CALCIUM: 9.5 mg/dL (ref 8.6–10.4)
CHLORIDE: 103 mmol/L (ref 98–110)
CO2: 24 mmol/L (ref 20–31)
CREATININE: 0.71 mg/dL (ref 0.50–1.05)
Glucose, Bld: 83 mg/dL (ref 65–99)
Potassium: 4.2 mmol/L (ref 3.5–5.3)
Sodium: 139 mmol/L (ref 135–146)
TOTAL PROTEIN: 8.1 g/dL (ref 6.1–8.1)
Total Bilirubin: 0.7 mg/dL (ref 0.2–1.2)

## 2016-09-24 NOTE — Telephone Encounter (Signed)
-----  Message from Sanda Klein, MD sent at 09/24/2016  9:26 AM EST ----- Routine chemistries are normal. Liver tests are abnormal, but show a generally improving trend over last 2 months.  CT in July showed fatty liver, no gallstones Please stop the pravastatin (this is not the reason for the abnormal LFTs, which started long before that medication) and recheck LFTs in 4-5 weeks. Will plan to restart the cholesterol medication once LFTs normalize.

## 2016-09-24 NOTE — Telephone Encounter (Signed)
Spoke to patient. Result given . Verbalized understanding Letter mailed with results and future lab slip

## 2016-09-29 ENCOUNTER — Other Ambulatory Visit: Payer: Self-pay | Admitting: Cardiothoracic Surgery

## 2016-09-29 DIAGNOSIS — I25119 Atherosclerotic heart disease of native coronary artery with unspecified angina pectoris: Secondary | ICD-10-CM

## 2016-09-29 NOTE — Progress Notes (Signed)
Elizabeth [address]       [place] [address]             [phone]      Elizabeth [name] Jack Medical Record #[id number] Date of Birth: 29-Jul-1956  Referring: Sanda Klein, MD Primary Care: Ledell Noss, MD  Chief Complaint:   POST OP FOLLOW UP 06/08/2016 OPERATIVE REPORT PREOPERATIVE DIAGNOSIS:  Coronary occlusive disease with unstable angina. POSTOPERATIVE DIAGNOSIS:  Coronary occlusive disease with unstable angina. SURGICAL PROCEDURE:  Coronary artery bypass grafting x4 with the left internal mammary to the left anterior descending coronary artery. Sequential reverse saphenous vein graft to the obtuse marginal and distal circumflex branches and reverse saphenous vein graft to the distal right coronary artery with right thigh endo vein harvesting of the right greater saphenous vein. SURGEON:  Lanelle Bal, MD  History of Present Illness:     Patient making progress following coronary artery bypass grafting done urgently July 24. The patient continues to improve. She is now back to doing her usual activities. He's had no recurrent angina. Her ACE inhibitor was stopped last week because of cough, she notes since stopping it her cough is markedly improved. She is going to pick up her ARB today. She smoked 1 cigarette postop and swears she is not could have another one. He's now completed physical therapy at home and is off home oxygen.   She does complain of some pulling and tenderness and keloid formation in the upper part of her sternal incision.       Past Medical History:  Diagnosis Date  . CAD (coronary artery disease)    NSTEMI 08/2011:  LHC 08/21/11: mLAD 60-70%, pCFX occluded, dRCA chronic occlusion with L-R collats, EF 40-45%, inf AK.  PCI:  BMS to CFX.  Marland Kitchen Depression   . Fibroids   . Fibromyalgia   . GERD (gastroesophageal reflux disease)   . History of microcytic hypochromic anemia   . HLD (hyperlipidemia)    Chol = 235, LDL =  156 (08/2010)  . Hypertension   . Insomnia   . Lupus 2009   ANA + 10/2008, repeat ANA + (05/2009), on that visit 05/2009 following labs obtained RF <20, CRP <0.4, ANA titers 1:80,   . Myocardial infarction   . Pulmonary nodules 05/2008   noted on CXR and CT 05/2008, repeat CT 10/2008 - Stable small bilateral pulmonary nodules measuring up to 6 m m  . Shortness of breath 08/22/2013  . Substance abuse    cocaine      History  Smoking Status  . Former Smoker  . Packs/day: 0.10  . Types: Cigarettes  . Quit date: 05/22/2016  Smokeless Tobacco  . Not on file    Comment: 1-2 cigs some days     History  Alcohol Use  . 4.2 oz/week  . 7 Standard drinks or equivalent per week    Comment: beer 1-2 per day     No Known Allergies  Current Outpatient Prescriptions  Medication Sig Dispense Refill  . acetaminophen (TYLENOL) 325 MG tablet Take 2 tablets (650 mg total) by mouth every 6 (six) hours as needed for mild pain, fever or headache.    Marland Kitchen apixaban (ELIQUIS) 5 MG TABS tablet Take 5 mg by mouth 2 (two) times daily.    Marland Kitchen aspirin EC 81 MG EC tablet Take 1 tablet (81 mg total) by mouth daily.    Marland Kitchen atorvastatin (LIPITOR) 80 MG tablet TAKE 1 TABLET(80 MG)  BY MOUTH DAILY 90 tablet 3  . carvedilol (COREG) 12.5 MG tablet TAKE 1 TABLET(12.5 MG) BY MOUTH TWICE DAILY 180 tablet 3  . furosemide (LASIX) 20 MG tablet TAKE 2 TABLETS(40 MG) BY MOUTH DAILY 180 tablet 0  . pantoprazole (PROTONIX) 40 MG tablet Take 1 tablet (40 mg total) by mouth daily. 30 tablet 5  . potassium chloride SA (K-DUR,KLOR-CON) 20 MEQ tablet Take 1 tablet (20 mEq total) by mouth daily. 90 tablet 3  . valsartan (DIOVAN) 320 MG tablet Take 1 tablet (320 mg total) by mouth daily. 30 tablet 11   No current facility-administered medications for this visit.        Physical Exam: BP (!) 146/90 (BP Location: Right Arm, Patient Position: Sitting, Cuff Size: Large)   Pulse 79   Resp 18   Ht _0  (1.575 m)   Wt 201 lb (91.2  kg)   LMP 12/30/1994   SpO2 93% Comment: ON RA  BMI 36.76 kg/m   General appearance: alert and cooperative Neurologic: intact Heart: regular rate and rhythm, S1, S2 normal, no murmur, click, rub or gallop Lungs: clear to auscultation bilaterally Abdomen: soft, non-tender; bowel sounds normal; no masses,  no organomegaly Extremities: extremities normal, atraumatic, no cyanosis or edema and Homans sign is negative, no sign of DVT Wound: Sternum is stable and well-healed, some slight contraction of the upper sternal scar with mild keloid formation. The lower portion of the sternum has no keloid formation.    Diagnostic Studies & Laboratory data:     Recent Radiology Findings:  Dg Chest 2 View  Result Date: 09/22/2016 CLINICAL DATA:  Previous CABG.  Shortness of breath with exertion. EXAM: CHEST  2 VIEW COMPARISON:  07/23/2016 FINDINGS: Previous median sternotomy and CABG. Moderate cardiomegaly. Aortic atherosclerosis. There is pulmonary venous hypertension without edema. No infiltrate, collapse or effusion. No acute bone finding. IMPRESSION: Cardiomegaly and pulmonary venous hypertension.  No frank edema. Electronically Signed   By: Nelson Chimes M.D.   On: 09/22/2016 15:31     Recent Lab Findings: Lab Results  Component Value Date   WBC 6.5 07/06/2016   HGB 10.1 (L) 07/06/2016   HCT 35.1 (L) 07/06/2016   PLT 357 07/06/2016   GLUCOSE 83 09/23/2016   CHOL 163 08/18/2016   TRIG 108 08/18/2016   HDL 41 (L) 08/18/2016   LDLDIRECT 145.2 10/29/2011   LDLCALC 100 08/18/2016   ALT 31 (H) 09/23/2016   AST 36 (H) 09/23/2016   NA 139 09/23/2016   K 4.2 09/23/2016   CL 103 09/23/2016   CREATININE 0.71 09/23/2016   BUN 10 09/23/2016   CO2 24 09/23/2016   TSH 2.779 06/10/2016   INR 1.48 07/06/2016   HGBA1C 6.2 (H) 06/07/2016      Assessment / Plan:      Patient status post urgent coronary artery bypass grafting with known left ventricular dysfunction is now progressing recently  well following surgery.  I've encouraged her to start in cardiac rehabilitation program which she is willing to try. She is confident she will not return to smoking. I discussed the keloid formation with her and suggested waiting at least 6-8 months before making any decision about scar revision.  She is closely followed by cardiology have not made a return appointment for her to see me but would be glad to see her at any time.  [name] MD      [address] [place] [address] Office [phone]   Beeper  [phone]  09/30/2016 4:10 PM

## 2016-09-30 ENCOUNTER — Encounter: Payer: Self-pay | Admitting: Cardiothoracic Surgery

## 2016-09-30 ENCOUNTER — Ambulatory Visit (INDEPENDENT_AMBULATORY_CARE_PROVIDER_SITE_OTHER): Payer: Medicare Other | Admitting: Cardiothoracic Surgery

## 2016-09-30 VITALS — BP 146/90 | HR 79 | Resp 18 | Ht 62.0 in | Wt 201.0 lb

## 2016-09-30 DIAGNOSIS — Z951 Presence of aortocoronary bypass graft: Secondary | ICD-10-CM | POA: Diagnosis not present

## 2016-09-30 DIAGNOSIS — I2510 Atherosclerotic heart disease of native coronary artery without angina pectoris: Secondary | ICD-10-CM | POA: Diagnosis not present

## 2016-10-07 ENCOUNTER — Encounter: Payer: Medicare Other | Admitting: Internal Medicine

## 2016-10-10 DIAGNOSIS — Z23 Encounter for immunization: Secondary | ICD-10-CM | POA: Diagnosis not present

## 2016-10-19 ENCOUNTER — Other Ambulatory Visit: Payer: Self-pay

## 2016-10-19 ENCOUNTER — Other Ambulatory Visit: Payer: Self-pay | Admitting: Family Medicine

## 2016-10-19 DIAGNOSIS — Z1231 Encounter for screening mammogram for malignant neoplasm of breast: Secondary | ICD-10-CM

## 2016-11-17 ENCOUNTER — Ambulatory Visit (INDEPENDENT_AMBULATORY_CARE_PROVIDER_SITE_OTHER): Payer: Medicare Other | Admitting: Internal Medicine

## 2016-11-17 ENCOUNTER — Encounter: Payer: Self-pay | Admitting: Internal Medicine

## 2016-11-17 VITALS — BP 178/100 | HR 81 | Temp 98.0°F | Wt 211.8 lb

## 2016-11-17 DIAGNOSIS — J449 Chronic obstructive pulmonary disease, unspecified: Secondary | ICD-10-CM

## 2016-11-17 DIAGNOSIS — I10 Essential (primary) hypertension: Secondary | ICD-10-CM

## 2016-11-17 DIAGNOSIS — Z7982 Long term (current) use of aspirin: Secondary | ICD-10-CM

## 2016-11-17 DIAGNOSIS — I5042 Chronic combined systolic (congestive) and diastolic (congestive) heart failure: Secondary | ICD-10-CM

## 2016-11-17 DIAGNOSIS — F17211 Nicotine dependence, cigarettes, in remission: Secondary | ICD-10-CM

## 2016-11-17 DIAGNOSIS — J9621 Acute and chronic respiratory failure with hypoxia: Secondary | ICD-10-CM | POA: Diagnosis not present

## 2016-11-17 DIAGNOSIS — Z951 Presence of aortocoronary bypass graft: Secondary | ICD-10-CM | POA: Diagnosis not present

## 2016-11-17 HISTORY — DX: Chronic obstructive pulmonary disease, unspecified: J44.9

## 2016-11-17 MED ORDER — ALBUTEROL SULFATE HFA 108 (90 BASE) MCG/ACT IN AERS
2.0000 | INHALATION_SPRAY | Freq: Four times a day (QID) | RESPIRATORY_TRACT | 2 refills | Status: DC | PRN
Start: 2016-11-17 — End: 2016-12-08

## 2016-11-17 MED ORDER — TIOTROPIUM BROMIDE MONOHYDRATE 18 MCG IN CAPS: 18.0000 ug | ORAL_CAPSULE | Freq: Every day | RESPIRATORY_TRACT | 2 refills | Status: DC

## 2016-11-17 NOTE — Assessment & Plan Note (Signed)
BP elevated today 178/100. She states that she forgot to take her medications today.   -continue valsartan 320 mg daily, lasix 40 mg qd, and coreg 12.5 mg BID - return to clinic next week for follow up of shortness of breath, will have BP recheck at that time

## 2016-11-17 NOTE — Assessment & Plan Note (Signed)
S/p CABG 05/2016. Follow up echo after the procedure showed EF 30%, grade 1 diastolic dysfunction and pulmonary artery pressure 45 which is likely secondary to her COPD. Has SOB today most likely related to COPD but denies chest pain, palpitations, orthopnea, PND, or leg swelling.   - continue atorvastatin, eliquis 5 mg qd, ASA 81 mg qd, coreg 12.5 mg daily, lasix 40 mg daily, K-dur 20 meq daily, and valsartan 320 mg daily

## 2016-11-17 NOTE — Patient Instructions (Addendum)
It was a pleasure to see you today [name]!   For your COPD- call us tomorrow if you do not have the oxygen delivered to your home tonight  START using the tiotripium (spiriva) inhaler every day  START using the albuterol inhaler whenever you have shortness of breath or wheezing   We have sent in a referral for you to see a pulmonologist, let this office know if you have not been contacted to see them within the next two weeks.   Schedule an appointment to come back to the clinic in 1 week so that we can make sure these new medications are helping your breathing

## 2016-11-17 NOTE — Progress Notes (Signed)
SATURATION QUALIFICATIONS: (This note is used to comply with regulatory documentation for home oxygen)  Patient Saturations on Room Air at Rest =  92-94%  Patient Saturations on Room Air with Ambulating 84-87%  Patient Saturations on 2 Liters of oxygen while Ambulating = 92%

## 2016-11-17 NOTE — Assessment & Plan Note (Signed)
Stable, no signs or symptoms of thrombosis.   Will continue to monitor

## 2016-11-17 NOTE — Assessment & Plan Note (Addendum)
HPI: She has been experiencing shortness of breath since the time of her CABG surgery 05/2016. She was discharged on home oxygen after the surgery but felt that her breathing had improved and CVTS follow up in September she scratch that had the oxygen saturation in the mid 90s and felt ready to wean off of oxygen. Since that time she has felt increased worsening of her shortness of breath. She believes this may be related to the cold weather. She has had a cough for the last few days and has had congestion for months. She became short of breath and had to stop multiple times while walking into the clinic today. She denies chest pain, palpitations, paroxysmal nocturnal dyspnea, leg swelling, or wheeze.   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 Echo 08/2016 EF 40% with pulmonary artery pressure 45. Initial O2 sat was 92% on room air. SpO2 84-87% with ambulation, improved to 94% with ambulation on 2 L. She has hypoxic respiratory failure likely secondary to COPD GOLD 2 and pulmonary hypertension and will require home oxygen. Ordered  24/7 home oxygen 2 liters. We had concern for her hypoxia and strongly encouraged her to be admitted to the hospital but she  refused admission. Ms. Ausburn has agreed to contact us if there is any problem with receiving her oxygen tonight.  Low threshold for admission if her symptoms do not improve.   - ordered tiotropium and albuterol inhalers  - encouraged to continue tobacco sensation  -referral to pulmonology  - will follow up in acute care clinic next week

## 2016-11-17 NOTE — Progress Notes (Signed)
CC: shortness of breath   HPI: [name] is a 61 y.o. with past medical history as outlined below who presents to clinic for follow up of shortness of breath. She has been experiencing shortness of breath since the time of her CABG surgery 05/2016. She was discharged on home oxygen after the surgery but felt that her breathing had improved and CVTS follow up in September she scratch that had the oxygen saturation in the mid 90s and felt ready to wean off of oxygen. Since that time she has felt increased worsening of her shortness of breath. She believes this may be related to the cold weather. She has had a cough for the last few days and has had congestion for months. She became short of breath and had to stop multiple times while walking into the clinic today. She denies chest pain, palpitations, paroxysmal nocturnal dyspnea, leg swelling, or wheeze.   Please see problem list for status of the pt's chronic medical problems.  Past Medical History:  Diagnosis Date  . CAD (coronary artery disease)    NSTEMI 08/2011:  LHC 08/21/11: mLAD 60-70%, pCFX occluded, dRCA chronic occlusion with L-R collats, EF 40-45%, inf AK.  PCI:  BMS to CFX.  Marland Kitchen COPD (chronic obstructive pulmonary disease) ([place]) 11/17/2016  . Depression   . Fibroids   . Fibromyalgia   . GERD (gastroesophageal reflux disease)   . History of microcytic hypochromic anemia   . HLD (hyperlipidemia)    Chol = 235, LDL = 156 (08/2010)  . Hypertension   . Insomnia   . Lupus 2009   ANA + 10/2008, repeat ANA + (05/2009), on that visit 05/2009 following labs obtained RF <20, CRP <0.4, ANA titers 1:80,   . Myocardial infarction   . Pulmonary nodules 05/2008   noted on CXR and CT 05/2008, repeat CT 10/2008 - Stable small bilateral pulmonary nodules measuring up to 6 m m  . Shortness of breath 08/22/2013  . Substance abuse    cocaine     Review of Systems:  Please see each problem below for a pertinent review of  systems.  Physical Exam:  Vitals:   11/17/16 1414  BP: (!) 178/100  Pulse: 81  Temp: 98 F (36.7 C)  SpO2: (!) 84%  Weight: 211 lb 12.8 oz (96.1 kg)   Physical Exam  Constitutional: She is oriented to person, place, and time. She appears well-developed and well-nourished. No distress.  Speaking in full sentences, appears short of breath   HENT:  Head: Normocephalic and atraumatic.  Eyes: Conjunctivae are normal. No scleral icterus.  Cardiovascular: Normal rate and regular rhythm.   No murmur heard. No lower extremity edema   Pulmonary/Chest: Breath sounds normal. She is in respiratory distress. She has no wheezes. She has no rales.  Abdominal: Soft. She exhibits no distension. There is no tenderness.  Neurological: She is alert and oriented to person, place, and time.  Skin: Skin is warm and dry. She is not diaphoretic.  Psychiatric: She has a normal mood and affect. Her behavior is normal.    Assessment & Plan:   See Encounters Tab for problem based charting.   Patient seen with Dr. Evette Doffing

## 2016-11-18 ENCOUNTER — Telehealth: Payer: Self-pay | Admitting: *Deleted

## 2016-11-18 NOTE — Progress Notes (Signed)
Internal Medicine Clinic Attending  I saw and evaluated the patient.  I personally confirmed the key portions of the history and exam documented by Dr. Hetty Ely and I reviewed pertinent patient test results.  The assessment, diagnosis, and plan were formulated together and I agree with the documentation in the resident's note.  Patient came to clinic with progressive worsening of chronic hypoxic respiratory failure over several weeks. Oxygen sat 84% upon walking into the clinic, much improved with 2L of supplemental O2. She was last on supp oxygen after CABG surgery in July, and weaned off over about one month. Pulmonary function testing shows evidence of COPD with FEV1 of 62%, bronchodilator responsiveness, high ERV, and low DLCO. This new hypoxia may represent worsening pulmonary disease, she is on no inhalers, but does not smoke any more. We offered her admission because of the hypoxia but she declined. Wanted instead to return home. We were able to arrange for expedited oxygen delivery to her home that evening. Plan is to start tiotropium and albuterol prn. No signs of COPD exacerbation on exam, so no systemic steroids or antibiotics for now. Increase diuresis from lasix 20 to 51m daily; last echo with EF 40% and elevated pulmonary artery pressure. Follow up in one week. Refer to pulmonology given degree of hypoxia seems out of proportion to obstructive pulmonary disease, this might be pulmonary hypertension.

## 2016-11-18 NOTE — Telephone Encounter (Signed)
Received faxed drug change request from pt's pharmacy with the following message "Plan does not cover medication prescribed.  Per Rx benefit plan alternative medication include: Advair, Breo, Incruse, Anoro. Please fax back with the approval along with strength, directions, quantity, and refills".  Will forward info to pcp for review and medication change if appropriate.  Please advise.[name], [name]1/3/201810:45 AM

## 2016-11-18 NOTE — Telephone Encounter (Signed)
Okay thank you, I can send the pharmacy a prescription to incruse instead if Dr. Evette Doffing is in agreement.

## 2016-11-19 MED ORDER — UMECLIDINIUM BROMIDE 62.5 MCG/INH IN AEPB: 1.0000 | INHALATION_SPRAY | Freq: Every day | RESPIRATORY_TRACT | 4 refills | Status: DC

## 2016-11-19 NOTE — Telephone Encounter (Signed)
I have discontinued the tiotropium and placed an order for Incruse to her pharmacy

## 2016-11-19 NOTE — Telephone Encounter (Signed)
Ok. I agree with Incruse. It is an antimuscarinic similar to Tiotropium, so good first like for COPD.

## 2016-11-19 NOTE — Addendum Note (Signed)
Addended by: Meryl Dare on: 11/19/2016 02:52 PM   Modules accepted: Orders

## 2016-11-24 ENCOUNTER — Inpatient Hospital Stay (HOSPITAL_COMMUNITY)
Admission: AD | Admit: 2016-11-24 | Discharge: 2016-11-27 | DRG: 189 | Disposition: A | Discharge status: Home / Self Care | Payer: Medicare Other | Source: Ambulatory Visit | Attending: Internal Medicine | Admitting: Internal Medicine

## 2016-11-24 ENCOUNTER — Encounter: Payer: Self-pay | Admitting: Internal Medicine

## 2016-11-24 ENCOUNTER — Ambulatory Visit (INDEPENDENT_AMBULATORY_CARE_PROVIDER_SITE_OTHER): Payer: Medicare Other | Admitting: Internal Medicine

## 2016-11-24 ENCOUNTER — Telehealth (HOSPITAL_COMMUNITY): Payer: Self-pay | Admitting: Cardiac Rehabilitation

## 2016-11-24 ENCOUNTER — Telehealth (HOSPITAL_COMMUNITY): Payer: Self-pay | Admitting: *Deleted

## 2016-11-24 ENCOUNTER — Inpatient Hospital Stay (HOSPITAL_COMMUNITY): Payer: Medicare Other

## 2016-11-24 ENCOUNTER — Inpatient Hospital Stay (HOSPITAL_COMMUNITY): Admission: RE | Admit: 2016-11-24 | Payer: Medicare Other | Source: Ambulatory Visit

## 2016-11-24 ENCOUNTER — Encounter (INDEPENDENT_AMBULATORY_CARE_PROVIDER_SITE_OTHER): Payer: Self-pay

## 2016-11-24 ENCOUNTER — Encounter (HOSPITAL_COMMUNITY): Payer: Self-pay | Admitting: General Practice

## 2016-11-24 DIAGNOSIS — Z9981 Dependence on supplemental oxygen: Secondary | ICD-10-CM

## 2016-11-24 DIAGNOSIS — K219 Gastro-esophageal reflux disease without esophagitis: Secondary | ICD-10-CM | POA: Diagnosis present

## 2016-11-24 DIAGNOSIS — R002 Palpitations: Secondary | ICD-10-CM | POA: Diagnosis present

## 2016-11-24 DIAGNOSIS — Z951 Presence of aortocoronary bypass graft: Secondary | ICD-10-CM

## 2016-11-24 DIAGNOSIS — R0902 Hypoxemia: Secondary | ICD-10-CM | POA: Insufficient documentation

## 2016-11-24 DIAGNOSIS — M797 Fibromyalgia: Secondary | ICD-10-CM | POA: Diagnosis present

## 2016-11-24 DIAGNOSIS — J449 Chronic obstructive pulmonary disease, unspecified: Secondary | ICD-10-CM | POA: Diagnosis present

## 2016-11-24 DIAGNOSIS — J9621 Acute and chronic respiratory failure with hypoxia: Secondary | ICD-10-CM | POA: Diagnosis not present

## 2016-11-24 DIAGNOSIS — I10 Essential (primary) hypertension: Secondary | ICD-10-CM | POA: Diagnosis present

## 2016-11-24 DIAGNOSIS — I110 Hypertensive heart disease with heart failure: Secondary | ICD-10-CM | POA: Diagnosis present

## 2016-11-24 DIAGNOSIS — Z8673 Personal history of transient ischemic attack (TIA), and cerebral infarction without residual deficits: Secondary | ICD-10-CM | POA: Diagnosis not present

## 2016-11-24 DIAGNOSIS — I509 Heart failure, unspecified: Secondary | ICD-10-CM | POA: Diagnosis not present

## 2016-11-24 DIAGNOSIS — Z87891 Personal history of nicotine dependence: Secondary | ICD-10-CM | POA: Diagnosis not present

## 2016-11-24 DIAGNOSIS — R0602 Shortness of breath: Secondary | ICD-10-CM

## 2016-11-24 DIAGNOSIS — Z9119 Patient's noncompliance with other medical treatment and regimen: Secondary | ICD-10-CM

## 2016-11-24 DIAGNOSIS — J101 Influenza due to other identified influenza virus with other respiratory manifestations: Secondary | ICD-10-CM | POA: Diagnosis present

## 2016-11-24 DIAGNOSIS — Z23 Encounter for immunization: Secondary | ICD-10-CM

## 2016-11-24 DIAGNOSIS — J9601 Acute respiratory failure with hypoxia: Secondary | ICD-10-CM

## 2016-11-24 DIAGNOSIS — R509 Fever, unspecified: Secondary | ICD-10-CM

## 2016-11-24 DIAGNOSIS — Z8249 Family history of ischemic heart disease and other diseases of the circulatory system: Secondary | ICD-10-CM | POA: Diagnosis not present

## 2016-11-24 DIAGNOSIS — Z7901 Long term (current) use of anticoagulants: Secondary | ICD-10-CM

## 2016-11-24 DIAGNOSIS — Z8041 Family history of malignant neoplasm of ovary: Secondary | ICD-10-CM | POA: Diagnosis not present

## 2016-11-24 DIAGNOSIS — Z9114 Patient's other noncompliance with medication regimen: Secondary | ICD-10-CM | POA: Diagnosis not present

## 2016-11-24 DIAGNOSIS — Z9861 Coronary angioplasty status: Secondary | ICD-10-CM

## 2016-11-24 DIAGNOSIS — I2510 Atherosclerotic heart disease of native coronary artery without angina pectoris: Secondary | ICD-10-CM | POA: Diagnosis present

## 2016-11-24 DIAGNOSIS — I272 Pulmonary hypertension, unspecified: Secondary | ICD-10-CM | POA: Diagnosis not present

## 2016-11-24 DIAGNOSIS — F1411 Cocaine abuse, in remission: Secondary | ICD-10-CM | POA: Diagnosis present

## 2016-11-24 DIAGNOSIS — I252 Old myocardial infarction: Secondary | ICD-10-CM | POA: Diagnosis not present

## 2016-11-24 DIAGNOSIS — I5043 Acute on chronic combined systolic (congestive) and diastolic (congestive) heart failure: Secondary | ICD-10-CM | POA: Diagnosis not present

## 2016-11-24 DIAGNOSIS — R05 Cough: Secondary | ICD-10-CM | POA: Diagnosis not present

## 2016-11-24 DIAGNOSIS — F17211 Nicotine dependence, cigarettes, in remission: Secondary | ICD-10-CM | POA: Diagnosis not present

## 2016-11-24 DIAGNOSIS — E876 Hypokalemia: Secondary | ICD-10-CM | POA: Diagnosis present

## 2016-11-24 DIAGNOSIS — Z79899 Other long term (current) drug therapy: Secondary | ICD-10-CM

## 2016-11-24 DIAGNOSIS — E785 Hyperlipidemia, unspecified: Secondary | ICD-10-CM | POA: Diagnosis present

## 2016-11-24 DIAGNOSIS — I480 Paroxysmal atrial fibrillation: Secondary | ICD-10-CM | POA: Diagnosis not present

## 2016-11-24 DIAGNOSIS — I5032 Chronic diastolic (congestive) heart failure: Secondary | ICD-10-CM | POA: Diagnosis present

## 2016-11-24 DIAGNOSIS — R0600 Dyspnea, unspecified: Secondary | ICD-10-CM

## 2016-11-24 DIAGNOSIS — I5042 Chronic combined systolic (congestive) and diastolic (congestive) heart failure: Secondary | ICD-10-CM | POA: Diagnosis not present

## 2016-11-24 HISTORY — DX: Cerebral infarction, unspecified: I63.9

## 2016-11-24 HISTORY — DX: Dependence on supplemental oxygen: Z99.81

## 2016-11-24 HISTORY — DX: Gout, unspecified: M10.9

## 2016-11-24 LAB — CBC
HCT: 37 % (ref 36.0–46.0)
HEMOGLOBIN: 11.3 g/dL — AB (ref 12.0–15.0)
MCH: 25.6 pg — AB (ref 26.0–34.0)
MCHC: 30.5 g/dL (ref 30.0–36.0)
MCV: 83.7 fL (ref 78.0–100.0)
Platelets: 222 10*3/uL (ref 150–400)
RBC: 4.42 MIL/uL (ref 3.87–5.11)
RDW: 19.6 % — ABNORMAL HIGH (ref 11.5–15.5)
WBC: 6.5 10*3/uL (ref 4.0–10.5)

## 2016-11-24 LAB — COMPREHENSIVE METABOLIC PANEL
ALK PHOS: 250 U/L — AB (ref 38–126)
ALT: 23 U/L (ref 14–54)
ANION GAP: 7 (ref 5–15)
AST: 33 U/L (ref 15–41)
Albumin: 3.6 g/dL (ref 3.5–5.0)
BUN: 11 mg/dL (ref 6–20)
CALCIUM: 9.3 mg/dL (ref 8.9–10.3)
CHLORIDE: 105 mmol/L (ref 101–111)
CO2: 28 mmol/L (ref 22–32)
Creatinine, Ser: 0.89 mg/dL (ref 0.44–1.00)
GFR calc Af Amer: >60 mL/min (ref >60)
Glucose, Bld: 113 mg/dL — ABNORMAL HIGH (ref 65–99)
Potassium: 3.3 mmol/L — ABNORMAL LOW (ref 3.5–5.1)
SODIUM: 140 mmol/L (ref 135–145)
Total Bilirubin: 0.6 mg/dL (ref 0.3–1.2)
Total Protein: 7.7 g/dL (ref 6.5–8.1)

## 2016-11-24 MED ORDER — RAMELTEON 8 MG PO TABS: 8.0000 mg | ORAL_TABLET | Freq: Every day | ORAL | Status: DC

## 2016-11-24 MED ORDER — APIXABAN 5 MG PO TABS
5.0000 mg | ORAL_TABLET | Freq: Two times a day (BID) | ORAL | Status: DC
  Administered 2016-11-25 – 2016-11-27 (×6): 5 mg via ORAL
  Filled 2016-11-24 (×6): qty 1

## 2016-11-24 MED ORDER — ATORVASTATIN CALCIUM 80 MG PO TABS
80.0000 mg | ORAL_TABLET | Freq: Every day | ORAL | Status: DC
  Administered 2016-11-25 – 2016-11-27 (×3): 80 mg via ORAL
  Filled 2016-11-24 (×4): qty 1

## 2016-11-24 MED ORDER — POTASSIUM CHLORIDE CRYS ER 20 MEQ PO TBCR: 20.0000 meq | EXTENDED_RELEASE_TABLET | Freq: Every day | ORAL | Status: DC

## 2016-11-24 MED ORDER — ACETAMINOPHEN 325 MG PO TABS
650.0000 mg | ORAL_TABLET | Freq: Four times a day (QID) | ORAL | Status: DC | PRN
  Administered 2016-11-25 – 2016-11-26 (×3): 650 mg via ORAL
  Filled 2016-11-24 (×3): qty 2

## 2016-11-24 MED ORDER — IPRATROPIUM-ALBUTEROL 0.5-2.5 (3) MG/3ML IN SOLN: 3.0000 mL | Freq: Four times a day (QID) | RESPIRATORY_TRACT | Status: DC

## 2016-11-24 MED ORDER — MELATONIN 3 MG PO TABS
6.0000 mg | ORAL_TABLET | Freq: Every evening | ORAL | Status: DC | PRN
  Administered 2016-11-25 – 2016-11-26 (×3): 6 mg via ORAL
  Filled 2016-11-24 (×4): qty 2

## 2016-11-24 MED ORDER — PANTOPRAZOLE SODIUM 40 MG PO TBEC
40.0000 mg | DELAYED_RELEASE_TABLET | Freq: Every day | ORAL | Status: DC
  Administered 2016-11-25 – 2016-11-27 (×3): 40 mg via ORAL
  Filled 2016-11-24 (×3): qty 1

## 2016-11-24 MED ORDER — GUAIFENESIN-CODEINE 100-10 MG/5ML PO SOLN
10.0000 mL | ORAL | Status: DC | PRN
  Administered 2016-11-25 – 2016-11-26 (×4): 10 mL via ORAL
  Filled 2016-11-24 (×4): qty 10

## 2016-11-24 MED ORDER — CARVEDILOL 12.5 MG PO TABS
12.5000 mg | ORAL_TABLET | Freq: Two times a day (BID) | ORAL | Status: DC
  Administered 2016-11-25 – 2016-11-27 (×5): 12.5 mg via ORAL
  Filled 2016-11-24 (×5): qty 1

## 2016-11-24 MED ORDER — SODIUM CHLORIDE 0.9% FLUSH
3.0000 mL | Freq: Two times a day (BID) | INTRAVENOUS | Status: DC
  Administered 2016-11-25 – 2016-11-27 (×5): 3 mL via INTRAVENOUS

## 2016-11-24 MED ORDER — ACETAMINOPHEN 325 MG PO TABS
325.0000 mg | ORAL_TABLET | Freq: Once | ORAL | Status: AC
  Administered 2016-11-24: 325 mg via ORAL
  Filled 2016-11-24: qty 1

## 2016-11-24 MED ORDER — FUROSEMIDE 10 MG/ML IJ SOLN
40.0000 mg | Freq: Once | INTRAMUSCULAR | Status: AC
  Administered 2016-11-25: 40 mg via INTRAVENOUS
  Filled 2016-11-24: qty 4

## 2016-11-24 MED ORDER — PNEUMOCOCCAL VAC POLYVALENT 25 MCG/0.5ML IJ INJ
0.5000 mL | INJECTION | INTRAMUSCULAR | Status: AC
  Administered 2016-11-25: 0.5 mL via INTRAMUSCULAR
  Filled 2016-11-24: qty 0.5

## 2016-11-24 MED ORDER — APIXABAN 5 MG PO TABS: 5.0000 mg | ORAL_TABLET | Freq: Every day | ORAL | Status: DC

## 2016-11-24 MED ORDER — IRBESARTAN 300 MG PO TABS: 150.0000 mg | ORAL_TABLET | Freq: Every day | ORAL | Status: DC

## 2016-11-24 MED ORDER — ASPIRIN EC 81 MG PO TBEC
81.0000 mg | DELAYED_RELEASE_TABLET | Freq: Every day | ORAL | Status: DC
Start: 2016-11-25 — End: 2016-11-27
  Administered 2016-11-26: 81 mg via ORAL
  Filled 2016-11-24 (×3): qty 1

## 2016-11-24 MED ORDER — ACETAMINOPHEN 650 MG RE SUPP: 650.0000 mg | Freq: Four times a day (QID) | RECTAL | Status: DC | PRN

## 2016-11-24 NOTE — H&P (Signed)
Date: 11/24/2016               Patient Name:  Elizabeth [name] MRN: [id number]  DOB: 07/02/1956 Age / Sex: 61 y.o., female   PCP: Ledell Noss, MD         Medical Service: Internal Medicine Teaching Service         Attending Physician: Dr. Sid Falcon, MD    First Contact: Dr. Wynetta Emery Pager: [phone]  Second Contact: Dr. Tiburcio Pea Pager: [phone]       After Hours (After 5p/  First Contact Pager: [phone]  weekends / holidays): Second Contact Pager: [phone]   Chief Complaint: shortness of breath  History of Present Illness:  Elizabeth [name] is a 61yo female with PMH of COPD, CAD s/p CABG in July 2017, PAF on xarelto, combined diastolic and systolic congestive heart failure with elevated PA pressure presenting with one month history progressive shortness of breath with exertion admitted from the [facility]. Patient states that she has had increasing symptoms despite oxygen therapy and increase in lasix. She states that she does not remember an inciting event or change associated with the onset of symptoms. She also reports "attacks" of shortness of breath at rest that pass on their own and are sometimes relieved by using her oxygen. She denies chest pain other than mild tenderness along her sternotomy scar that is chronic and unchanged. She endorses long history of palpitations, about one episode a week, associated with shortness of breath without chest pain or syncope. She endorses LE edema that has not been relieved by an increase in her lasix (from 83m to 434m and has noted a decrease in her urine output the last couple of days without other urinary symptoms, as well as orthopnea. She has about a one week history of frontal headaches that are new for her, and not associated with vision or hearing changes, numbness, tingling, focal weakness or other neurologic symptoms. She endorses a couple day history of productive cough and nasal congestion without fevers, chills, or abdominal symptoms. She  endorses a +30lb weight gain since her CABG in July. She has just picked up her albuterol and incruse inhalers day prior to admission and states using them once - she is unsure that she actually received the doses.   In the clinic she was ambulated on 2L [place] and became tachypneic to 40 breaths per minute and desaturated to 89%.   Meds:  Current Meds  Medication Sig  . albuterol (PROVENTIL HFA;VENTOLIN HFA) 108 (90 Base) MCG/ACT inhaler Inhale 2 puffs into the lungs every 6 (six) hours as needed for wheezing or shortness of breath.  . Marland Kitchenpixaban (ELIQUIS) 5 MG TABS tablet Take 5 mg by mouth daily.   . carvedilol (COREG) 12.5 MG tablet TAKE 1 TABLET(12.5 MG) BY MOUTH TWICE DAILY  . furosemide (LASIX) 20 MG tablet TAKE 2 TABLETS(40 MG) BY MOUTH DAILY  . pantoprazole (PROTONIX) 40 MG tablet Take 1 tablet (40 mg total) by mouth daily.  . potassium chloride SA (K-DUR,KLOR-CON) 20 MEQ tablet Take 1 tablet (20 mEq total) by mouth daily.  . Marland Kitchenmeclidinium bromide (INCRUSE ELLIPTA) 62.5 MCG/INH AEPB Inhale 1 puff into the lungs daily.    Allergies: Allergies as of 11/24/2016  . (No Known Allergies)   Past Medical History:  Diagnosis Date  . Asthma   . CAD (coronary artery disease)    NSTEMI 08/2011:  LHC 08/21/11: mLAD 60-70%, pCFX occluded, dRCA chronic occlusion with L-R collats, EF 40-45%, inf  AK.  PCI:  BMS to CFX.  Marland Kitchen COPD (chronic obstructive pulmonary disease) ([place]) 11/17/2016  . Depression   . Fibroids   . Fibromyalgia   . GERD (gastroesophageal reflux disease)   . Gout   . History of microcytic hypochromic anemia   . HLD (hyperlipidemia)    Chol = 235, LDL = 156 (08/2010)  . Hypertension   . Insomnia   . Lupus 2009   ANA + 10/2008, repeat ANA + (05/2009), on that visit 05/2009 following labs obtained RF <20, CRP <0.4, ANA titers 1:80,   . Myocardial infarction 1990s  . On home oxygen therapy    "2L; 24/7 as of today" (11/24/2016)  . Pulmonary nodules 05/2008   noted on CXR and CT  05/2008, repeat CT 10/2008 - Stable small bilateral pulmonary nodules measuring up to 6 m m  . Shortness of breath 08/22/2013  . Stroke [facility]) 04/2014   "mini stroke"  . Substance abuse    cocaine     Family History: Mother died of MI at age 32, Father died of cirrhosis; Sister died in 3's with ovarian cancer, brother died in 63's with prostate cancer  Social History: Former smoker, ~25p years, quit in July 2017 (admits to having one cigarette last week); drinks one beer a day; former cocaine abuse, last was 3 years ago.   Review of Systems: A complete ROS was negative except as per HPI.   Physical Exam: Blood pressure (!) 150/88, pulse 79, temperature 98.5 F (36.9 C), temperature source Oral, resp. rate 19, height _0  (1.575 m), weight 212 lb 1.3 oz (96.2 kg), last menstrual period 12/30/1994, SpO2 97 %. General: alert, well-developed, and cooperative to examination.  Head: normocephalic and atraumatic.  Eyes: vision grossly intact, pupils equal, pupils round, pupils reactive to light, no injection and anicteric.  Mouth: pharynx pink and moist, no erythema, and no exudates.  Neck: supple, full ROM, no thyromegaly, no JVD.  Lungs: mild increase in work of breathing at rest worsened by movement, normal breath sounds, no crackles, and no wheezes. Heart: RRR, no murmur, no gallop, and no rub.  Abdomen: soft, non-tender, normal bowel sounds, no distention, no guarding, no rebound tenderness.  Msk: no joint swelling, no joint warmth, and no redness over joints.  Pulses: DP +2 on left, +1 on right, radials 2+ bilaterally Extremities: No cyanosis, clubbing; warm, 1+ pitting edema in bil LE Neurologic: alert & oriented X3, cranial nerves II-XII grossly intact, strength normal in all extremities, sensation intact to light touch.  Skin: turgor normal and no rashes.  Psych: Oriented X3, memory intact for recent and remote, normally interactive, good eye contact, not anxious appearing, and not  depressed appearing  Assessment & Plan by Problem: Active Problems:   Acute respiratory failure with hypoxia (HCC)  Acute respiratory failure with hypoxia: Patient with one month history of progressive shortness of breath with exertion. She has a history of COPD but history and exam are not consistent with exacerbation. Her last echo noted combined heart failure as well as elevated pulmonary artery pressures to 43m Hg; worsening PAH could be contributing to her symptoms. She has had steady weight gain, bil LE edema and decreasing UOP despite increase in lasix dose which is concerning for worsening heart failure. --CXR --ECHO to re-evaluate heart function and PAH  --IV lasix 41monce; follow BMet - re-evaluate UOP in AM and adjust dose as necessary --supplemental oxygen as needed  Combined systolic and diastolic heart failure: Patient with  last Echo 10/17 showing EF 40-45% and G1DD; symptoms of orthopnea, and bil LE edema not relieved with increase in home lasix. On exam, she does not have crackles but does have 1+ pitting edema bilaterally. --IV lasix 37m once; re-eval UOP and symptoms in AM and adjust dose for tomorrow as necessary. --f/u ECHO --continue valsartan 3232mdaily and carvedilol 12.65m51mID; may need optimization of medicines if showing worsening EF  Paroxysmal atrial fibrillation: Patient with h/o PAF even before CABG with continued weekly episodes of palpitations. She is compliant with her Eliquis. Was previously on amiodarone but now only rate controlled with coreg. CHA2DS2VASc Score 6 --continue Eliquis 65mg61mD --continue carvedilol 12.65mg 69m  COPD: Patient with COPD not on home medicines; was prescribed albuterol and incruse but only taken once and incorrectly. No wheezing on exam today.  --duonebs while inpatient with RT to assure proper technique.  HTN: Patient on home regimen of valsartan and carvedilol. Mildly hypertensive on arrival today. --continue home  regimen for now  GERD: Patient continued on home pantoprazole 40mg 56my.   Diet: HH/CM fluid restricted IVF: none DVT/PE: Eliquis Code: FULL  Dispo: Admit patient to Observation with expected length of stay less than 2 midnights.  Signed: GoricaAlphonzo Grieve/07/2017, 11:15 PM  Pager [phone]

## 2016-11-24 NOTE — Telephone Encounter (Signed)
-----  Message from Sanda Klein, MD sent at 11/24/2016 10:54 AM EST ----- Regarding: RE: cardiac rehab  Yes, please. Thanks MCr ----- Message ----- From: Lowell Guitar, RN Sent: 11/23/2016   4:47 PM To: Sanda Klein, MD Subject: cardiac rehab                                  Dear Dr. Sallyanne Kuster,  Pt is scheduled to begin cardiac rehab.  She uses home oxygen.  Is it ok to use oxygen and titrated to keep O2 sats 88% at cardiac rehab?  Thank you, Andi Hence, RN, BSN Cardiac Pulmonary Rehab

## 2016-11-24 NOTE — Progress Notes (Signed)
Pt arrived at the unit at [place] with some family members. MD paged for orders.

## 2016-11-24 NOTE — Progress Notes (Signed)
Internal Medicine Clinic Attending  I saw and evaluated the patient.  I personally confirmed the key portions of the history and exam documented by Dr. Charlynn Grimes and I reviewed pertinent patient test results.  The assessment, diagnosis, and plan were formulated together and I agree with the documentation in the resident's note.  Patient came for one week follow up with acute hypoxic respiratory failure. At our last visit she declined hospitalization. We instead arranged for home oxygen, which she has been using with activity only. We doubled lasix dose given her decreased EF. We started Incruse inhaler given COPD on PFTs, which she has not been using as directed. She comes back with worsening symptoms of DOE, now can do minimal activity at home. We walked her in our hallway on 2L of oxygen and she became tachypneic to 40 with two word sentences. I again advised direct admission for supportive care and evaluation of her recurrent hypoxia, this time she is feeling ill enough that she consents to admission. No wheezing on exam.   In her case, I would repeat an Echo given the pre to post op EF declined, and to estimate pulmonary pressures. Would get a chest xray. Would diurese with careful monitoring of renal function. I am most worried about pulmonary hypertension in her case, which may be exasperated by worsening EF. I doubt COPD exacerbation.

## 2016-11-24 NOTE — Assessment & Plan Note (Addendum)
  Elizabeth [name] was seen in clinic 11/17/16. At that time she was noted to be hypoxic with O2 sat of 84% with ambulation. She recently had CABG surgery in July 2017 and was on supplemental O2 and weaned off after a month at that time. Her symptoms have all begun since surgery. She reports never having any issues with her breathing prior.  PFTs shows evidence of COPD with FEV1 of 62%, bronchodilator responsiveness, high ERV, and low DLCO. She was prescribed an albuterol inhaler and incruse inhaler last weeks. She reports that she tried both inhalers once with no improvement and stopped taking them. She was unable to demonstrate proper technique for me today.  She does have heart failure with last EF 40% and elevated pulmonary artery pressures. Her lasix were increased from 20 mg daily to 40 mg daily last week. She reports compliance with this today.  She was offered admission last week but declined. She was set up with home O2, the inhalers as above and the increased lasix dose. She returns to clinic today for follow up. She reports using 2L O2 at home as needed. She is using when she is up and moving around but not at rest. She reports becoming dyspneic with minimal exertion; only able to walk 5-10 feet without becoming short of breath. Reports mostly asymptomatic at rest but does occasionally have attacks (3-4 times a week) at rest where she has difficulty breathing and uses the oxygen. She also reports using the oxygen at night because she is afraid she will not wake up.   She again desaturated to 86% on RA with walking and when 2L [place] was applied her O2 sat only improved to 89% with visible respiratory distress, tachypnea to 40 respirations a minute. Discussed again admission for further work up and she was agreeable today.   Plan Will admit to the hospital. Contacted and discussed with inpatient team. Contacted bed control and reportedly 20 patient ahead of her waiting on a bed. Patient wishes to go home  and get her things together before being admitted. Will arrange for direct admission.  No wheezes on exam or other symptoms to suggest COPD exacerbation. No signs of volume overload on exam.   Most likely etiology is pulmonology hypertension. Will need CXR, pulm consult on admission. Consider further higher resolution imaging.

## 2016-11-24 NOTE — Progress Notes (Signed)
SATURATION QUALIFICATIONS: (This note is used to comply with regulatory documentation for home oxygen)  Patient Saturations on Room Air at Rest = 94%  Patient Saturations on Room Air while Ambulating = 86%  Patient Saturations on 2 Liters of oxygen while Ambulating = 89%

## 2016-11-24 NOTE — Progress Notes (Signed)
CC: Shortness of breath  HPI:  [name] is a 61 y.o. female with a past medical history listed below here today for follow up of her worsening chronic hypoxic respiratory failure.  [name] was seen in clinic 11/17/16. At that time she was noted to be hypoxic with O2 sat of 84% with ambulation. She recently had CABG surgery in July 2017 and was on supplemental O2 and weaned off after a month at that time. Her symptoms have all begun since surgery. She reports never having any issues with her breathing prior.  PFTs shows evidence of COPD with FEV1 of 62%, bronchodilator responsiveness, high ERV, and low DLCO. She was prescribed an albuterol inhaler and incruse inhaler last weeks. She reports that she tried both inhalers once with no improvement and stopped taking them. She was unable to demonstrate proper technique for me today.  She does have heart failure with last EF 40% and elevated pulmonary artery pressures. Her lasix were increased from 20 mg daily to 40 mg daily last week. She reports compliance with this today.  She was offered admission last week but declined. She was set up with home O2, the inhalers as above and the increased lasix dose. She returns to clinic today for follow up. She reports using 2L O2 at home as needed. She is using when she is up and moving around but not at rest. She reports becoming dyspneic with minimal exertion; only able to walk 5-10 feet without becoming short of breath. Reports mostly asymptomatic at rest but does occasionally have attacks (3-4 times a week) at rest where she has difficulty breathing and uses the oxygen. She also reports using the oxygen at night because she is afraid she will not wake up.   She again desaturated to 86% on RA with walking and when 2L [place] was applied her O2 sat only improved to 89% with visible respiratory distress, tachypnea to 40 respirations a minute. Discussed again admission for further work up and she was  agreeable today.   Past Medical History:  Diagnosis Date  . CAD (coronary artery disease)    NSTEMI 08/2011:  LHC 08/21/11: mLAD 60-70%, pCFX occluded, dRCA chronic occlusion with L-R collats, EF 40-45%, inf AK.  PCI:  BMS to CFX.  Marland Kitchen COPD (chronic obstructive pulmonary disease) ([place]) 11/17/2016  . Depression   . Fibroids   . Fibromyalgia   . GERD (gastroesophageal reflux disease)   . History of microcytic hypochromic anemia   . HLD (hyperlipidemia)    Chol = 235, LDL = 156 (08/2010)  . Hypertension   . Insomnia   . Lupus 2009   ANA + 10/2008, repeat ANA + (05/2009), on that visit 05/2009 following labs obtained RF <20, CRP <0.4, ANA titers 1:80,   . Myocardial infarction   . Pulmonary nodules 05/2008   noted on CXR and CT 05/2008, repeat CT 10/2008 - Stable small bilateral pulmonary nodules measuring up to 6 m m  . Shortness of breath 08/22/2013  . Substance abuse    cocaine     Review of Systems:   Review of Systems  Constitutional: Negative for chills, diaphoresis, fever and weight loss.  Respiratory: Positive for shortness of breath. Negative for cough, sputum production and wheezing.   Cardiovascular: Negative for chest pain, palpitations, orthopnea, leg swelling and PND.  Neurological: Negative for dizziness and headaches.   Physical Exam:  Vitals:   11/24/16 1113  BP: (!) 185/104  Temp: 97.2 F (36.2 C)  TempSrc: Oral  Weight: 212 lb 3.2 oz (96.3 kg)   Physical Exam  Constitutional: She is oriented to person, place, and time. She appears distressed.  HENT:  Head: Normocephalic and atraumatic.  Neck: No JVD present. No tracheal deviation present.  Cardiovascular: Normal rate, regular rhythm and normal heart sounds.  Exam reveals no gallop and no friction rub.   No murmur heard. Pulmonary/Chest: Breath sounds normal. No stridor. She is in respiratory distress. She has no wheezes. She has no rales. She exhibits no tenderness.  Moderate Respiratory Distress    Abdominal: Soft. Bowel sounds are normal. She exhibits no distension. There is no tenderness. There is no rebound.  Musculoskeletal: She exhibits no edema.  Neurological: She is alert and oriented to person, place, and time.  Skin: Skin is warm and dry.    Assessment & Plan:   See Encounters Tab for problem based charting.  Patient seen with Dr. Evette Doffing

## 2016-11-25 ENCOUNTER — Other Ambulatory Visit (HOSPITAL_COMMUNITY): Payer: Medicare Other

## 2016-11-25 DIAGNOSIS — J9601 Acute respiratory failure with hypoxia: Secondary | ICD-10-CM

## 2016-11-25 DIAGNOSIS — J101 Influenza due to other identified influenza virus with other respiratory manifestations: Secondary | ICD-10-CM

## 2016-11-25 DIAGNOSIS — I5042 Chronic combined systolic (congestive) and diastolic (congestive) heart failure: Secondary | ICD-10-CM

## 2016-11-25 DIAGNOSIS — I10 Essential (primary) hypertension: Secondary | ICD-10-CM

## 2016-11-25 DIAGNOSIS — I480 Paroxysmal atrial fibrillation: Secondary | ICD-10-CM

## 2016-11-25 LAB — BLOOD GAS, ARTERIAL
Acid-Base Excess: 1.3 mmol/L (ref 0.0–2.0)
BICARBONATE: 25 mmol/L (ref 20.0–28.0)
Drawn by: 365271
O2 CONTENT: 2 L/min
O2 SAT: 94 %
PATIENT TEMPERATURE: 98.6
PO2 ART: 74 mmHg — AB (ref 83.0–108.0)
pCO2 arterial: 37.7 mmHg (ref 32.0–48.0)
pH, Arterial: 7.437 (ref 7.350–7.450)

## 2016-11-25 LAB — INFLUENZA PANEL BY PCR (TYPE A & B)
Influenza A By PCR: POSITIVE — AB
Influenza B By PCR: NEGATIVE

## 2016-11-25 LAB — BRAIN NATRIURETIC PEPTIDE: B Natriuretic Peptide: 525.4 pg/mL — ABNORMAL HIGH (ref 0.0–100.0)

## 2016-11-25 LAB — MAGNESIUM
Magnesium: <1 mg/dL — CL (ref 1.7–2.4)
Magnesium: 2.3 mg/dL (ref 1.7–2.4)

## 2016-11-25 MED ORDER — MAGNESIUM SULFATE 4 GM/100ML IV SOLN
4.0000 g | Freq: Once | INTRAVENOUS | Status: DC
  Filled 2016-11-25: qty 100

## 2016-11-25 MED ORDER — ONDANSETRON HCL 4 MG PO TABS: 4.0000 mg | ORAL_TABLET | Freq: Two times a day (BID) | ORAL | Status: DC

## 2016-11-25 MED ORDER — ONDANSETRON HCL 4 MG PO TABS: 4.0000 mg | ORAL_TABLET | Freq: Two times a day (BID) | ORAL | Status: DC | PRN

## 2016-11-25 MED ORDER — IBUPROFEN 600 MG PO TABS: 600.0000 mg | ORAL_TABLET | Freq: Four times a day (QID) | ORAL | Status: DC | PRN

## 2016-11-25 MED ORDER — OSELTAMIVIR PHOSPHATE 75 MG PO CAPS
75.0000 mg | ORAL_CAPSULE | Freq: Every day | ORAL | Status: DC
  Filled 2016-11-25: qty 1

## 2016-11-25 MED ORDER — IPRATROPIUM-ALBUTEROL 0.5-2.5 (3) MG/3ML IN SOLN
3.0000 mL | Freq: Three times a day (TID) | RESPIRATORY_TRACT | Status: DC
  Administered 2016-11-25 – 2016-11-26 (×4): 3 mL via RESPIRATORY_TRACT
  Filled 2016-11-25 (×4): qty 3

## 2016-11-25 MED ORDER — POTASSIUM CHLORIDE CRYS ER 20 MEQ PO TBCR
40.0000 meq | EXTENDED_RELEASE_TABLET | Freq: Once | ORAL | Status: AC
  Administered 2016-11-25: 40 meq via ORAL
  Filled 2016-11-25: qty 2

## 2016-11-25 MED ORDER — ONDANSETRON HCL 4 MG PO TABS: 4.0000 mg | ORAL_TABLET | Freq: Three times a day (TID) | ORAL | Status: DC | PRN

## 2016-11-25 MED ORDER — FUROSEMIDE 10 MG/ML IJ SOLN: 40.0000 mg | Freq: Every day | INTRAMUSCULAR | Status: DC

## 2016-11-25 MED ORDER — ORAL CARE MOUTH RINSE
15.0000 mL | Freq: Two times a day (BID) | OROMUCOSAL | Status: DC
  Administered 2016-11-25 – 2016-11-27 (×4): 15 mL via OROMUCOSAL

## 2016-11-25 MED ORDER — OSELTAMIVIR PHOSPHATE 75 MG PO CAPS
75.0000 mg | ORAL_CAPSULE | Freq: Two times a day (BID) | ORAL | Status: DC
  Administered 2016-11-25 – 2016-11-27 (×5): 75 mg via ORAL
  Filled 2016-11-25 (×6): qty 1

## 2016-11-25 MED ORDER — IPRATROPIUM-ALBUTEROL 0.5-2.5 (3) MG/3ML IN SOLN: 3.0000 mL | RESPIRATORY_TRACT | Status: DC | PRN

## 2016-11-25 MED ORDER — FUROSEMIDE 80 MG PO TABS
80.0000 mg | ORAL_TABLET | Freq: Every day | ORAL | Status: DC
  Administered 2016-11-25 – 2016-11-27 (×3): 80 mg via ORAL
  Filled 2016-11-25 (×4): qty 1

## 2016-11-25 NOTE — Progress Notes (Signed)
Central telemetery called &  Noted that the pt.had an episode of non sustained Vtach .Pt.is asymptomatic .MD  on call  made aware & ordered stat EKG.Will cont.to monitor pt.

## 2016-11-25 NOTE — Progress Notes (Addendum)
Pt with complaint of chest pain rating 5/10 and nausea. VS taken and reading bp 148/76, hr-70, oxy sat-97% on 2l[place], and temp of 98.3. Provider Rollene Rotunda was notified of pt's complaint as well the fact that pt's iv infiltrated so we have been unsuccessful at administering her iv lasix or mag at this time. IV RN x2 have attempted to get  A new site. Will page provider back again to let him know and see any new suggestions/orders. Will continue to monitor pt. Pt given tylenol for pain and ginger ale for nausea. Pt to get a PICC line placed

## 2016-11-25 NOTE — Progress Notes (Signed)
Subjective: Elizabeth [name] is feeling okay today except for her cough productive of clear sputum - which continues to bother her but responds to cough syrup. Also experiencing some chest and stomach aching due to her cough. We discussed the likely multifactorial nature of her shortness of breath and treatment plan and she voiced understanding.  She was found to be Influenza A positive this morning and was started on Tamiflu.  Objective: Vital signs in last 24 hours: Vitals:   11/24/16 2000 11/24/16 2248 11/25/16 0511 11/25/16 0755  BP:  (!) 150/88 137/78   Pulse:  79 73   Resp:  19 (!) 22   Temp:  98.5 F (36.9 C) 98.1 F (36.7 C)   TempSrc:  Oral Oral   SpO2: 95% 97% 97% 100%  Weight:      Height:        Intake/Output Summary (Last 24 hours) at 11/25/16 0841 Last data filed at 11/25/16 8616  Gross per 24 hour  Intake                0 ml  Output             2800 ml  Net            -2800 ml   Filed Weights   11/24/16 1851  Weight: 212 lb 1.3 oz (96.2 kg)   Physical Exam General appearance: Well-nourished woman resting comfortably in bed eating lunch, in no distress, productive cough  HENT: Normocephalic, atraumatic Cardiovascular: Regular rate and rhythm, no murmurs, rubs, gallops Respiratory/Chest: Clear to ausculation bilaterally, normal work of breathing Abdomen: Bowel sounds present, soft, non-tender, non-distended Skin: Warm, dry, intact Neuro: Cranial nerves grossly intact, alert and oriented Psych: Appropriate affect  Labs / Imaging / Procedures: CBC Latest Ref Rng & Units 11/24/2016 07/06/2016 06/19/2016  WBC 4.0 - 10.5 K/uL 6.5 6.5 9.6  Hemoglobin 12.0 - 15.0 g/dL 11.3(L) 10.1(L) 7.9(L)  Hematocrit 36.0 - 46.0 % 37.0 35.1(L) 26.4(L)  Platelets 150 - 400 K/uL 222 357 331   BMP Latest Ref Rng & Units 11/24/2016 09/23/2016 08/18/2016  Glucose 65 - 99 mg/dL 113(H) 83 87  BUN 6 - 20 mg/dL _0 Creatinine 0.44 - 1.00 mg/dL 0.89 0.71 0.77  Sodium 135 - 145 mmol/L 140  139 138  Potassium 3.5 - 5.1 mmol/L 3.3(L) 4.2 3.5  Chloride 101 - 111 mmol/L 105 103 102  CO2 22 - 32 mmol/L _1 Calcium 8.9 - 10.3 mg/dL 9.3 9.5 9.3   Dg Chest 2 View  Result Date: 11/24/2016 CLINICAL DATA:  Shortness of breath and cough for 1 month. Symptoms worsening. EXAM: CHEST  2 VIEW COMPARISON:  09/22/2016 FINDINGS: Postoperative changes in the mediastinum. Mild hyperinflation. Normal heart size and pulmonary vascularity. Slight interstitial changes in the lung bases, likely fibrosis. No focal airspace disease or consolidation in the lungs. No blunting of costophrenic angles. No pneumothorax. Mediastinal contours appear intact. Calcification of the aorta. Degenerative changes in the shoulders. IMPRESSION: Lung hyperinflation and basilar fibrosis. No evidence of active pulmonary disease. Electronically Signed   By: Lucienne Capers M.D.   On: 11/24/2016 23:41    Assessment/Plan:  Acute hypoxic respiratory failure One month progressive dyspnea on exertion, despite oxygen and increased lasix at home. Seen in clinic and desatting to 80s with respiratory distress on home oxygen with exertion. Also with LE edema, orthopnea and decreased urine output, has gained 10 lbs in 2 months. Also has not been using  her inhalers for COPD, as she tried them once and did not find them helpful. Few days of URI symptoms, no changes on CXR, found to be Influenza A positive. Hypoxia etiology likely mutlifactorial - influenza, untreated COPD, component of CHF with weight gain, orthop, and edema -- Tamiflu 75 mg BID for 5 days, ending on 1/14 -- Duonebs Q8 scheduled plus Q4 PRN -- Guaifenesin-codeine cough syrup Q4h PRN -- Tylenol and Ibuprofen PRN for chest/stomach ache 2/2 cough -- TTE to eval CHF, PAH may be worse from previous -- Checking BNP - elevated 500+ -- IV Lasix 40 mg QD -- Daily weights, strict I/Os -- Continue pulse ox and supplemental O2 as needed to maintain O2 sat > 92%  Combined  systolic and diastolic heart failure: Last TTE 10/17 showing LVEF 40-45% and G1DD; symptoms of orthopnea, and BLE edema not relieved with increased diuretics at home. 1+ pitting edema bilaterally, but lungs sound clear. --IV lasix 49m once; re-eval UOP and symptoms in AM and adjust dose for tomorrow as necessary. -- Follow TTE, BNP, response to diuretics per above -- Trend BMP -- continue ARB / Irbesartan 150 mg daily and Carvedilol 12.547mBID; may require further optimization   Paroxysmal atrial fibrillation, CHA2DS2VASc Score 6: Patient with h/o PAF even before CABG with continued weekly episodes of palpitations. She is compliant with her Eliquis. Was previously on amiodarone but now only rate controlled with coreg.  --continue Eliquis 51m62mID --continue Carvedilol 12.51mg41mD  COPD, PFT 05/2016 shows evidence of COPD with FEV1 of 62%, bronchodilator responsiveness, high ERV, and low DLCO. She was prescribed an albuterol inhaler and incruse inhaler recently, but not taking.  -- Duonebs and restart home inhalers per above  Hypomagnesemia, checked Mg in setting of oral/IV diuresis and prolonged QT seen on EKG, found to be <1 on 1/10 - IV Magnesium sulfate 4 g now - Recheck Mg this PM and tomorrow morning  HTN - continue home meds - Valsartan and Carvedilol GERD - continue home Protonix   Dispo: Anticipated discharge in approximately 1-2 day(s).   LOS: 1 day   AdamAsencion Partridge 11/25/2016, 8:41 AM Pager: [phone]

## 2016-11-25 NOTE — Discharge Instructions (Addendum)
It was a pleasure to take care of you Elizabeth [name].  You were feeling unwell because you had the flu. This also caused your CHF and COPD to act up.  We are treating your flu with a medicine called Tamiflu. We have prescribed this for you to complete at home.  It may take another 1-2 weeks for you to completely get back to normal, but we have scheduled you an appointment in the [facility] Medicine Clinic in one week to check up on you.  Please increase your Lasix to 40 mg twice a day and check your weights daily at home. Continue the Potassium supplement as prescribed.  You can turn up your oxygen to 3 liters during activity, but it should remain at 2 liters while at rest (same as before).  IT IS VERY IMPORTANT TO USE YOUR INCRUSE INHALER EACH DAY.    Influenza, Adult Influenza (the flu") is an infection in the lungs, nose, and throat (respiratory tract). It is caused by a virus. The flu causes many common cold symptoms, as well as a high fever and body aches. It can make you feel very sick. The flu spreads easily from person to person (is contagious). Getting a flu shot (influenza vaccination) every year is the best way to prevent the flu. Follow these instructions at home:  Take over-the-counter and prescription medicines only as told by your doctor.  Use a cool mist humidifier to add moisture (humidity) to the air in your home. This can make it easier to breathe.  Rest as needed.  Drink enough fluid to keep your pee (urine) clear or pale yellow.  Cover your mouth and nose when you cough or sneeze.  Wash your hands with soap and water often, especially after you cough or sneeze. If you cannot use soap and water, use hand sanitizer.  Stay home from work or school as told by your doctor. Unless you are visiting your doctor, try to avoid leaving home until your fever has been gone for 24 hours without the use of medicine.  Keep all follow-up visits as told by your doctor. This is  important. How is this prevented?  Getting a yearly (annual) flu shot is the best way to avoid getting the flu. You may get the flu shot in late summer, fall, or winter. Ask your doctor when you should get your flu shot.  Wash your hands often or use hand sanitizer often.  Avoid contact with people who are sick during cold and flu season.  Eat healthy foods.  Drink plenty of fluids.  Get enough sleep.  Exercise regularly. Contact a doctor if:  You get new symptoms.  You have:  Chest pain.  Watery poop (diarrhea).  A fever.  Your cough gets worse.  You start to have more mucus.  You feel sick to your stomach (nauseous).  You throw up (vomit). Get help right away if:  You start to be short of breath or have trouble breathing.  Your skin or nails turn a bluish color.  You have very bad pain or stiffness in your neck.  You get a sudden headache.  You get sudden pain in your face or ear.  You cannot stop throwing up. This information is not intended to replace advice given to you by your health care provider. Make sure you discuss any questions you have with your health care provider. Document Released: 08/11/2008 Document Revised: 04/09/2016 Document Reviewed: 08/27/2015 Elsevier Interactive Patient Education  2017 [employer].  Information on my medicine - ELIQUIS (apixaban)  This medication education was reviewed with me or my healthcare representative as part of my discharge preparation.  The pharmacist that spoke with me during my hospital stay was:  Tad Moore, [facility]  Why was Eliquis prescribed for you? Eliquis was prescribed for you to reduce the risk of forming blood clots that can cause a stroke if you have a medical condition called atrial fibrillation (a type of irregular heartbeat) OR to reduce the risk of a blood clots forming after orthopedic surgery.  What do You need to know about Eliquis ? Take your Eliquis TWICE DAILY - one  tablet in the morning and one tablet in the evening with or without food.  It would be best to take the doses about the same time each day.  If you have difficulty swallowing the tablet whole please discuss with your pharmacist how to take the medication safely.  Take Eliquis exactly as prescribed by your doctor and DO NOT stop taking Eliquis without talking to the doctor who prescribed the medication.  Stopping may increase your risk of developing a new clot or stroke.  Refill your prescription before you run out.  After discharge, you should have regular check-up appointments with your healthcare provider that is prescribing your Eliquis.  In the future your dose may need to be changed if your kidney function or weight changes by a significant amount or as you get older.  What do you do if you miss a dose? If you miss a dose, take it as soon as you remember on the same day and resume taking twice daily.  Do not take more than one dose of ELIQUIS at the same time.  Important Safety Information A possible side effect of Eliquis is bleeding. You should call your healthcare provider right away if you experience any of the following: ? Bleeding from an injury or your nose that does not stop. ? Unusual colored urine (red or dark brown) or unusual colored stools (red or black). ? Unusual bruising for unknown reasons. ? A serious fall or if you hit your head (even if there is no bleeding).  Some medicines may interact with Eliquis and might increase your risk of bleeding or clotting while on Eliquis. To help avoid this, consult your healthcare provider or pharmacist prior to using any new prescription or non-prescription medications, including herbals, vitamins, non-steroidal anti-inflammatory drugs (NSAIDs) and supplements.  This website has more information on Eliquis (apixaban): [email or web address].no.

## 2016-11-25 NOTE — Consult Note (Signed)
   [facility] CM Inpatient Consult   11/25/2016  Elizabeth [name] 1956/07/16 [id number]   Chart reviewed.  Patient could likely benefit from [facility] COPD. 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 From home with family. States independent with ADL's and uses home oxygen [employer]), 2L.   - Positive Influenza A By PCR resulted 11/25/2015.  No further needs assessed at this time.  For questions, please contact:  Natividad Brood, RN BSN [facility] Liaison  [phone] business mobile phone Toll free office [phone]

## 2016-11-25 NOTE — Care Management Note (Addendum)
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 From home with family. States independent with ADL's and uses home oxygen [facility]), 2L.   - Positive Influenza A By PCR resulted 11/25/2015     Guy Sandifer (Sister) Jeri Cos (Daughter)    [phone]       PCP: Ledell Noss  Action/Plan:  [facility] / CODP referral made via epic . Information given to pt per CM.  Plan is to d/c to home when medically stable. CM to f/u with disposition needs.  Expected Discharge Date:                  Expected Discharge Plan:  Home/Self Care  In-House Referral:     Discharge planning Services  CM Consult  Post Acute Care Choice:  Resumption of Svcs/PTA Provider Choice offered to:  Patient  DME Arranged:  Oxygen DME Agency:  [place]:    [place] Agency:     Status of Service:  In process, will continue to follow  If discussed at Long Length of Stay Meetings, dates discussed:    Additional Comments:  Sharin Mons, RN 11/25/2016, 3:28 PM

## 2016-11-25 NOTE — Progress Notes (Signed)
Lab contacted RN and stated magnesium is less than 1.0. MD paged to make aware.

## 2016-11-26 ENCOUNTER — Inpatient Hospital Stay (HOSPITAL_COMMUNITY): Payer: Medicare Other

## 2016-11-26 DIAGNOSIS — I272 Pulmonary hypertension, unspecified: Secondary | ICD-10-CM

## 2016-11-26 LAB — ECHOCARDIOGRAM COMPLETE
Ao-asc: 35 cm
CHL CUP RV SYS PRESS: 39 mmHg
E decel time: 225 msec
EERAT: 14.14
FS: 34 % (ref 28–44)
HEIGHTINCHES: 62 in
IV/PV OW: 1.01
LA ID, A-P, ES: 36 mm
LA diam end sys: 36 mm
LA vol index: 41.8 mL/m2
LA vol: 88 mL
LADIAMINDEX: 1.71 cm/m2
LAVOLA4C: 108 mL
LV E/e'average: 14.14
LV PW d: 9.66 mm — AB (ref 0.6–1.1)
LVEEMED: 14.14
LVELAT: 6.31 cm/s
LVOT SV: 62 mL
LVOT VTI: 15 cm
LVOT area: 4.15 cm2
LVOT diameter: 23 mm
LVOT peak vel: 71.7 cm/s
MV Dec: 225
MV pk A vel: 70 m/s
MV pk E vel: 89.2 m/s
MVPG: 3 mmHg
RV LATERAL S' VELOCITY: 5.57 cm/s
RV TAPSE: 12.2 mm
Reg peak vel: 299 cm/s
TDI e' lateral: 6.31
TDI e' medial: 4.03
TRMAXVEL: 299 cm/s
Weight: 3400 oz

## 2016-11-26 LAB — VITAMIN D 25 HYDROXY (VIT D DEFICIENCY, FRACTURES): VIT D 25 HYDROXY: 6.4 ng/mL — AB (ref 30.0–100.0)

## 2016-11-26 LAB — CBC
HCT: 37.9 % (ref 36.0–46.0)
HEMOGLOBIN: 12 g/dL (ref 12.0–15.0)
MCH: 26.1 pg (ref 26.0–34.0)
MCHC: 31.7 g/dL (ref 30.0–36.0)
MCV: 82.6 fL (ref 78.0–100.0)
Platelets: 226 10*3/uL (ref 150–400)
RBC: 4.59 MIL/uL (ref 3.87–5.11)
RDW: 19.2 % — ABNORMAL HIGH (ref 11.5–15.5)
WBC: 7.6 10*3/uL (ref 4.0–10.5)

## 2016-11-26 LAB — BASIC METABOLIC PANEL
ANION GAP: 10 (ref 5–15)
BUN: 11 mg/dL (ref 6–20)
CALCIUM: 8.7 mg/dL — AB (ref 8.9–10.3)
CO2: 25 mmol/L (ref 22–32)
Chloride: 100 mmol/L — ABNORMAL LOW (ref 101–111)
Creatinine, Ser: 0.84 mg/dL (ref 0.44–1.00)
Glucose, Bld: 91 mg/dL (ref 65–99)
POTASSIUM: 3.3 mmol/L — AB (ref 3.5–5.1)
Sodium: 135 mmol/L (ref 135–145)

## 2016-11-26 LAB — MAGNESIUM: Magnesium: 1.9 mg/dL (ref 1.7–2.4)

## 2016-11-26 MED ORDER — IPRATROPIUM-ALBUTEROL 0.5-2.5 (3) MG/3ML IN SOLN
3.0000 mL | Freq: Two times a day (BID) | RESPIRATORY_TRACT | Status: DC
  Administered 2016-11-26: 3 mL via RESPIRATORY_TRACT
  Filled 2016-11-26: qty 3

## 2016-11-26 MED ORDER — ALBUTEROL SULFATE (2.5 MG/3ML) 0.083% IN NEBU: 2.5000 mg | INHALATION_SOLUTION | RESPIRATORY_TRACT | Status: DC | PRN

## 2016-11-26 MED ORDER — POTASSIUM CHLORIDE CRYS ER 20 MEQ PO TBCR
40.0000 meq | EXTENDED_RELEASE_TABLET | Freq: Once | ORAL | Status: AC
  Administered 2016-11-26: 40 meq via ORAL
  Filled 2016-11-26: qty 2

## 2016-11-26 MED ORDER — KETOROLAC TROMETHAMINE 60 MG/2ML IM SOLN
30.0000 mg | Freq: Once | INTRAMUSCULAR | Status: AC
  Administered 2016-11-26: 30 mg via INTRAMUSCULAR
  Filled 2016-11-26: qty 2

## 2016-11-26 MED ORDER — GUAIFENESIN-CODEINE 100-10 MG/5ML PO SOLN
10.0000 mL | ORAL | Status: DC
  Administered 2016-11-26 – 2016-11-27 (×7): 10 mL via ORAL
  Filled 2016-11-26 (×7): qty 10

## 2016-11-26 NOTE — Progress Notes (Signed)
Subjective: Persistent cough, chest ache, headache this morning. T 101 and slightly more difficulty breathing. On revisit late morning she is much more comfortably with headache resolved, breathing well. Cough persists, remains productive, sputum now brown. Elizabeth [name] feels that she is slowly improving but expressed frustration with her persistent illnesses.  Objective: Vital signs in last 24 hours: Vitals:   11/26/16 0545 11/26/16 0700 11/26/16 0852 11/26/16 0855  BP: 131/74  124/72   Pulse: 79 78    Resp: 20 20    Temp: (!) 100.4 F (38 C) (!) 101 F (38.3 C)    TempSrc: Oral Oral    SpO2: 92% 96%  97%  Weight:      Height:        Intake/Output Summary (Last 24 hours) at 11/26/16 9735 Last data filed at 11/26/16 0100  Gross per 24 hour  Intake                0 ml  Output              900 ml  Net             -900 ml   Filed Weights   11/24/16 1851 11/26/16 0256  Weight: 212 lb 1.3 oz (96.2 kg) 212 lb 8 oz (96.4 kg)   Physical Exam General appearance: Well-nourished woman resting in bed, in mild distress, productive cough, appears uncomfortable HENT: Normocephalic, atraumatic Cardiovascular: Regular rate and rhythm, no murmurs, rubs, gallops Respiratory/Chest: Clear to ausculation bilaterally, mild increased work of breathing with grunting breath sounds Abdomen: Bowel sounds present, soft, non-tender, non-distended Skin: Warm, dry, intact Neuro: Cranial nerves grossly intact, alert and oriented Psych: Appropriate affect  Labs / Imaging / Procedures: CBC Latest Ref Rng & Units 11/24/2016 07/06/2016 06/19/2016  WBC 4.0 - 10.5 K/uL 6.5 6.5 9.6  Hemoglobin 12.0 - 15.0 g/dL 11.3(L) 10.1(L) 7.9(L)  Hematocrit 36.0 - 46.0 % 37.0 35.1(L) 26.4(L)  Platelets 150 - 400 K/uL 222 357 331   BMP Latest Ref Rng & Units 11/26/2016 11/24/2016 09/23/2016  Glucose 65 - 99 mg/dL 91 113(H) 83  BUN 6 - 20 mg/dL _0 Creatinine 0.44 - 1.00 mg/dL 0.84 0.89 0.71  Sodium 135 - 145 mmol/L 135  140 139  Potassium 3.5 - 5.1 mmol/L 3.3(L) 3.3(L) 4.2  Chloride 101 - 111 mmol/L 100(L) 105 103  CO2 22 - 32 mmol/L _1 Calcium 8.9 - 10.3 mg/dL 8.7(L) 9.3 9.5   No results found.  Assessment/Plan:  Acute hypoxic respiratory failure One month progressive dyspnea on exertion, despite oxygen and increased lasix at home. Seen in clinic and desatting to 80s with respiratory distress on home oxygen with exertion. Also with LE edema, orthopnea and decreased urine output, has gained 10 lbs in 2 months. Also has not been using her inhalers for COPD, as she tried them once and did not find them helpful. Few days of URI symptoms, no changes on CXR, found to be Influenza A positive. Hypoxia etiology likely mutlifactorial - influenza, untreated COPD, component of CHF with weight gain, orthop, and edema. BNP 550.  -- CXR 2 view - appears unchanged this AM -- Tamiflu 75 mg BID for 5 days, ending on 1/14 -- Duonebs Q8 scheduled plus Q4 PRN -- Guaifenesin-codeine cough syrup Q4H sched -- Tylenol and Ibuprofen PRN for chest/stomach ache 2/2 cough -- Toradol 30 mg IM x1 this AM for aches/headache -- TTE to eval CHF - improved from previous in  08/2016 -- Lasix 80 mg QD -- Daily weights, strict I/Os -- Continue pulse ox and supplemental O2 as needed to maintain O2 sat > 60%  Combined systolic and diastolic heart failure: Last TTE 10/17 showing LVEF 40-45% and G1DD; symptoms of orthopnea, and BLE edema not relieved with increased diuretics at home. 1+ pitting edema bilaterally, but lungs sound clear. TTE on 1/11 revealed LVEF 50-55%, G1DD, PA peak pressure 39 improved from previous. -- Lasix 80 mg daily -- Follow TTE, BNP, response to diuretics per above -- Trend BMP -- continue ARB / Irbesartan 150 mg daily and Carvedilol 12.117m BID; may require further optimization   Paroxysmal atrial fibrillation, CHA2DS2VASc Score 6: Patient with h/o PAF even before CABG with continued weekly episodes of  palpitations. She is compliant with her Eliquis. Was previously on amiodarone but now only rate controlled with coreg.  --continue Eliquis 564mBID --continue Carvedilol 12.17m42mID  COPD, PFT 05/2016 shows evidence of COPD with FEV1 of 62%, bronchodilator responsiveness, high ERV, and low DLCO. She was prescribed an albuterol inhaler and incruse inhaler recently, but not taking.  -- Duonebs and restart home inhalers per above  HypoK, 3.3 this AM - admin 1x KDur 40 mEq  HypoMg, normal on recheck after receiving no Mg supplementation, apparently lab error  HTN - continue home meds - Carvedilol, holding Valsartan in setting of diuresis GERD - continue home Protonix   Dispo: Anticipated discharge in approximately 1-2 day(s).   LOS: 2 days   AdaAsencion PartridgeD 11/26/2016, 9:04 AM Pager: [phone]

## 2016-11-26 NOTE — Progress Notes (Signed)
Echocardiogram 2D Echocardiogram has been performed.  Elizabeth [name] 11/26/2016, 4:31 PM

## 2016-11-26 NOTE — Progress Notes (Signed)
  Date: 11/26/2016  Patient name: Elizabeth [name]  Medical record number: [id number]  Date of birth: [birth year]   I have seen and evaluated this patient and I have discussed the plan of care with the house staff. Please see Dr. Durenda Age note for complete details. I concur with her findings.  She is improved today.  Coughing better.  She is still recovering from the flu.  She may be able to go home tomorrow.   Sid Falcon, MD 11/26/2016, 2:09 PM

## 2016-11-27 ENCOUNTER — Other Ambulatory Visit: Payer: Self-pay | Admitting: Internal Medicine

## 2016-11-27 ENCOUNTER — Telehealth: Payer: Self-pay

## 2016-11-27 ENCOUNTER — Institutional Professional Consult (permissible substitution): Payer: Medicare Other | Admitting: Pulmonary Disease

## 2016-11-27 DIAGNOSIS — I5032 Chronic diastolic (congestive) heart failure: Secondary | ICD-10-CM

## 2016-11-27 DIAGNOSIS — I10 Essential (primary) hypertension: Secondary | ICD-10-CM

## 2016-11-27 LAB — BASIC METABOLIC PANEL
ANION GAP: 11 (ref 5–15)
BUN: 16 mg/dL (ref 6–20)
CO2: 25 mmol/L (ref 22–32)
Calcium: 8.8 mg/dL — ABNORMAL LOW (ref 8.9–10.3)
Chloride: 98 mmol/L — ABNORMAL LOW (ref 101–111)
Creatinine, Ser: 0.99 mg/dL (ref 0.44–1.00)
GFR calc non Af Amer: >60 mL/min (ref >60)
Glucose, Bld: 96 mg/dL (ref 65–99)
POTASSIUM: 3.4 mmol/L — AB (ref 3.5–5.1)
Sodium: 134 mmol/L — ABNORMAL LOW (ref 135–145)

## 2016-11-27 MED ORDER — OSELTAMIVIR PHOSPHATE 75 MG PO CAPS: 75.0000 mg | ORAL_CAPSULE | Freq: Two times a day (BID) | ORAL | 0 refills | Status: DC

## 2016-11-27 MED ORDER — GUAIFENESIN-CODEINE 100-10 MG/5ML PO SOLN: 10.0000 mL | Freq: Four times a day (QID) | ORAL | Status: DC | PRN

## 2016-11-27 MED ORDER — POTASSIUM CHLORIDE CRYS ER 20 MEQ PO TBCR
40.0000 meq | EXTENDED_RELEASE_TABLET | Freq: Once | ORAL | Status: AC
  Administered 2016-11-27: 40 meq via ORAL
  Filled 2016-11-27: qty 2

## 2016-11-27 MED ORDER — GUAIFENESIN-CODEINE 100-10 MG/5ML PO SOLN: 10.0000 mL | Freq: Four times a day (QID) | ORAL | 0 refills | Status: DC | PRN

## 2016-11-27 MED ORDER — FUROSEMIDE 40 MG PO TABS: 40.0000 mg | ORAL_TABLET | Freq: Two times a day (BID) | ORAL | 0 refills | Status: DC

## 2016-11-27 NOTE — Discharge Summary (Signed)
Name: Elizabeth [name] MRN: [id number] DOB: August 11, 1956 61 y.o. PCP: Elizabeth Noss, MD  Date of Admission: 11/24/2016  6:33 PM Date of Discharge: 11/27/2016 Attending Physician: Sid Falcon, MD  Discharge Diagnosis: 1. Acute on chronic hypoxic respiratory failure 2. Influenza A 3. Acute on chronic combined systolic and diastolic congestive heart failure 4. Paroxysmal atrial fibrillation 5. Hypokalemia 6. Hypertension 7. Vitamin D deficiency  Active Problems:   Essential hypertension   Coronary atherosclerosis   Paroxysmal atrial fibrillation (HCC)   Chronic combined systolic and diastolic congestive heart failure (HCC)   Acute respiratory failure with hypoxia (HCC)   Influenza A   Discharge Medications: Allergies as of 11/27/2016   No Known Allergies     Medication List    STOP taking these medications   valsartan 320 MG tablet Commonly known as:  DIOVAN     TAKE these medications   acetaminophen 325 MG tablet Commonly known as:  TYLENOL Take 2 tablets (650 mg total) by mouth every 6 (six) hours as needed for mild pain, fever or headache.   albuterol 108 (90 Base) MCG/ACT inhaler Commonly known as:  PROVENTIL HFA;VENTOLIN HFA Inhale 2 puffs into the lungs every 6 (six) hours as needed for wheezing or shortness of breath.   aspirin 81 MG EC tablet Take 1 tablet (81 mg total) by mouth daily.   atorvastatin 80 MG tablet Commonly known as:  LIPITOR TAKE 1 TABLET(80 MG) BY MOUTH DAILY   carvedilol 12.5 MG tablet Commonly known as:  COREG TAKE 1 TABLET(12.5 MG) BY MOUTH TWICE DAILY   ELIQUIS 5 MG Tabs tablet Generic drug:  apixaban Take 5 mg by mouth daily.   furosemide 40 MG tablet Commonly known as:  LASIX Take 1 tablet (40 mg total) by mouth 2 (two) times daily. What changed:  See the new instructions.   guaiFENesin-codeine 100-10 MG/5ML syrup Take 10 mLs by mouth every 6 (six) hours as needed for cough.   oseltamivir 75 MG capsule Commonly known  as:  TAMIFLU Take 1 capsule (75 mg total) by mouth 2 (two) times daily.   pantoprazole 40 MG tablet Commonly known as:  PROTONIX Take 1 tablet (40 mg total) by mouth daily.   potassium chloride SA 20 MEQ tablet Commonly known as:  K-DUR,KLOR-CON Take 1 tablet (20 mEq total) by mouth daily.   umeclidinium bromide 62.5 MCG/INH Aepb Commonly known as:  INCRUSE ELLIPTA Inhale 1 puff into the lungs daily.       Disposition and follow-up:   [name] was discharged from [facility] in Stable condition.  At the hospital follow up visit please address:  1.  Acute hypoxic respiratory failure - assess for dyspnea and URI symptoms, exercise intolerance, increased oxygen requirement (2L home O2 baseline), Lasix 17m BID, and COPD inhalers (incruse ellipta and albuterol)  Influenza A - assess resolution of URI symptoms and completion of course of Tamiflu 75 mg BID  Chronic combined systolic and diastolic heart failure - assess for symptoms of CHF, weight, salt/fluid intake, and adherence to diuretic regimen, her Valsartan 1624mwas held at discharge due to normotension 110s/60s without restarting it during her hospital stay - restart ACE/ARB at lower dose outpatient  Paroxysmal atrial fibrillation - assess for palpitations, adherence with Eliquis 53m57mID and Coreg 12.53mg34mD  COPD - assess adherence with home inhalers  Hypokalemia - assess potassium level in setting of diuretic regimen adjustements  Hypertension - assess pressures and current hypertensive regimen  Vitamin D deficiency - start vitamin D supplementation  2.  Labs / imaging needed at time of follow-up: BMP  3.  Pending labs/ test needing follow-up: None  Follow-up Appointments: Follow-up Information    [facility]. Go on 12/04/2016.   Why:  At 10:45 AM, please arrive 15 minutes early to check in. Contact information: [address]  [facility] Course by problem list: Active Problems:   Essential hypertension   Coronary atherosclerosis   Paroxysmal atrial fibrillation (HCC)   Chronic combined systolic and diastolic congestive heart failure (HCC)   Acute respiratory failure with hypoxia (HCC)   Influenza A   1. Acute hypoxic respiratory failure  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 In clinic she was ambulated on her home oxygen 2L and became tachypneic to the 40s and desaturated to the high 80s. Her dyspnea etiology likely mutlifactorial - influenza, COPD, CHF. Supplemental oxygen, Tamiflu, scheduled Duonebs, scheduled Lasix, and cough syrup for symptom relief gradually improved her symptoms. Her breathing and cough had greatly improved with good po intake and no dyspnea on exertion by 1/12 and was stable for discharge home with close follow up.  Influenza A - reported few day history of productive cough and nasal congestion and was found to be Influenza A positive shortly after admission. She was started on a 5 day course of Tamiflu 75 mg BID with gradual symptom improvement.   Acute on chronic combined systolic and diastolic heart failure - presented with worsening orthopnea and bilateral LE edema, BNP 525, 10 lbs weight gain in 2 months, started on Lasix 80 mg daily with modest improvement in weight. Previous TTE 08/2016 showed EF 40-45% with G1DD. Updated TTE on 1/11 showing LVEF 50-55%, G1DD, PA peak pressure 39 improved from previous. Discharged on Lasix 40 mg BID.  Paroxysmal atrial fibrillation - CHA2DS2VASc score 6, on Xarelto and Coreg, continues to report weekly palpitations with associated dyspnea. The patient was monitored on telemetry and her anticoagulation and  rate-control were continued throughout her stay without incident.   COPD - reported noncompliance with home inhalers on admission and placed on scheduled Duoneb treatments but reported minimal improvement following these. Advised her to use her umeclidinium bromide daily on discharge.   Hypokalemia - required intermittent oral supplementation during hospital stay. Prescribed daily 20 mEq supplementation on discharge to compensate for increased home diuretic regimen.  Discharge Vitals:   BP 119/65 (BP Location: Left Arm)   Pulse 71   Temp 98.6 F (37 C) (Oral)   Resp (!) 22   Ht _0  (1.575 m)   Wt 208 lb 11.2 oz (94.7 kg)   LMP 12/30/1994   SpO2 95%   BMI 38.17 kg/m   Pertinent Labs, Studies, and Procedures:  CBC Latest Ref Rng & Units 11/26/2016 11/24/2016 07/06/2016  WBC 4.0 - 10.5 K/uL 7.6 6.5 6.5  Hemoglobin 12.0 - 15.0 g/dL 12.0 11.3(L) 10.1(L)  Hematocrit 36.0 - 46.0 % 37.9 37.0 35.1(L)  Platelets 150 - 400 K/uL 226 222 357   BMP Latest Ref Rng & Units 11/27/2016 11/26/2016 11/24/2016  Glucose 65 - 99 mg/dL 96 91 113(H)  BUN 6 - 20  mg/dL _0 Creatinine 0.44 - 1.00 mg/dL 0.99 0.84 0.89  Sodium 135 - 145 mmol/L 134(L) 135 140  Potassium 3.5 - 5.1 mmol/L 3.4(L) 3.3(L) 3.3(L)  Chloride 101 - 111 mmol/L 98(L) 100(L) 105  CO2 22 - 32 mmol/L _1 Calcium 8.9 - 10.3 mg/dL 8.8(L) 8.7(L) 9.3   Results for AMAIYAH, NORDHOFF (MRN [id number]) as of 11/29/2016 22:30  Ref. Range 11/24/2016 23:32  Influenza A By PCR Latest Ref Range: NEGATIVE  POSITIVE (A)   Results for YERALDINE, FORNEY (MRN [id number]) as of 11/29/2016 22:30  Ref. Range 11/25/2016 08:36  B Natriuretic Peptide Latest Ref Range: 0.0 - 100.0 pg/mL 525.4 (H)   Results for JULIANI, LADUKE (MRN [id number]) as of 11/29/2016 22:30  Ref. Range 11/25/2016 01:08  Vitamin D, 25-Hydroxy Latest Ref Range: 30.0 - 100.0 ng/mL 6.4 (L)    Dg Chest 2 View  Result Date: 11/24/2016 CLINICAL DATA:  Shortness of breath and  cough for 1 month. Symptoms worsening. EXAM: CHEST  2 VIEW COMPARISON:  09/22/2016 FINDINGS: Postoperative changes in the mediastinum. Mild hyperinflation. Normal heart size and pulmonary vascularity. Slight interstitial changes in the lung bases, likely fibrosis. No focal airspace disease or consolidation in the lungs. No blunting of costophrenic angles. No pneumothorax. Mediastinal contours appear intact. Calcification of the aorta. Degenerative changes in the shoulders. IMPRESSION: Lung hyperinflation and basilar fibrosis. No evidence of active pulmonary disease. Electronically Signed   By: Lucienne Capers M.D.   On: 11/24/2016 23:41   Transthoracic echocardiogram - 11/26/2016 Study Conclusions  - Left ventricle: The cavity size was normal. Wall thickness was normal. Systolic function was normal. The estimated ejection fraction was in the range of 50% to 55%  Hypokinesis of the basal-midinferolateral myocardium. Features are consistent with a pseudonormal left ventricular filling pattern, with concomitant abnormal relaxation and increased filling pressure (grade 2 diastolic dysfunction). - Left atrium: The atrium was mildly dilated. - Right atrium: The atrium was mildly dilated. - Pulmonary arteries: Systolic pressure was mildly increased. PA peak pressure: 39 mm Hg (S).  Discharge Instructions: Discharge Instructions    (HEART FAILURE PATIENTS) Call MD:  Anytime you have any of the following symptoms: 1) 3 pound weight gain in 24 hours or 5 pounds in 1 week 2) shortness of breath, with or without a dry hacking cough 3) swelling in the hands, feet or stomach 4) if you have to sleep on extra pillows at night in order to breathe.    Complete by:  As directed    Call MD for:  difficulty breathing, headache or visual disturbances    Complete by:  As directed    Call MD for:  extreme fatigue    Complete by:  As directed    Call MD for:  persistant dizziness or light-headedness    Complete by:  As  directed    Call MD for:  redness, tenderness, or signs of infection (pain, swelling, redness, odor or green/yellow discharge around incision site)    Complete by:  As directed    Call MD for:  temperature >100.4    Complete by:  As directed    Diet - low sodium heart healthy    Complete by:  As directed    Discharge instructions    Complete by:  As directed    It was a pleasure to take care of you Ms. Cayson.  You were feeling unwell because you had the flu. This also caused your CHF  and COPD to act up.  We are treating your flu with a medicine called Tamiflu. We have prescribed this for you to complete at home.  It may take another 1-2 weeks for you to completely get back to normal, but we have scheduled you an appointment in the Internal Medicine Clinic in one week to check up on you.  Please increase your Lasix to 40 mg twice a day and check your weights daily at home. Continue the Potassium supplement as prescribed.   Increase activity slowly    Complete by:  As directed       Signed: Asencion Partridge, MD 12/03/2016, 6:38 PM   Pager: [phone]

## 2016-11-27 NOTE — Progress Notes (Addendum)
Subjective: Breathing and cough much improved. Good appetite. Mild dyspnea with exertion. Reports noticeable urine output and is pleased to hear that she lost some fluid weight. States that she feels "a little out of it" today and did not sleep very well.  Objective: Vital signs in last 24 hours: Vitals:   11/27/16 0314 11/27/16 0528 11/27/16 0833 11/27/16 0837  BP:  116/67 117/74 119/65  Pulse:  69 74 66  Resp:  (!) 22    Temp:  98.6 F (37 C)    TempSrc:  Oral    SpO2:  93% 95% 96%  Weight: 208 lb 11.2 oz (94.7 kg)     Height:        Intake/Output Summary (Last 24 hours) at 11/27/16 1152 Last data filed at 11/27/16 0400  Gross per 24 hour  Intake                0 ml  Output              900 ml  Net             -900 ml   Filed Weights   11/24/16 1851 11/26/16 0256 11/27/16 0314  Weight: 212 lb 1.3 oz (96.2 kg) 212 lb 8 oz (96.4 kg) 208 lb 11.2 oz (94.7 kg)   Physical Exam General appearance: Well-nourished woman resting in bed, in no distress, conversational HENT: Normocephalic, atraumatic Cardiovascular: Regular rate and rhythm, no murmurs, rubs, gallops Respiratory/Chest: Clear to ausculation bilaterally, normal work of breathing Abdomen: Bowel sounds present, soft, non-tender, non-distended Skin: Warm, dry, intact Neuro: Cranial nerves grossly intact, alert and oriented Psych: Appropriate affect  Labs / Imaging / Procedures: CBC Latest Ref Rng & Units 11/26/2016 11/24/2016 07/06/2016  WBC 4.0 - 10.5 K/uL 7.6 6.5 6.5  Hemoglobin 12.0 - 15.0 g/dL 12.0 11.3(L) 10.1(L)  Hematocrit 36.0 - 46.0 % 37.9 37.0 35.1(L)  Platelets 150 - 400 K/uL 226 222 357   BMP Latest Ref Rng & Units 11/27/2016 11/26/2016 11/24/2016  Glucose 65 - 99 mg/dL 96 91 113(H)  BUN 6 - 20 mg/dL _0 Creatinine 0.44 - 1.00 mg/dL 0.99 0.84 0.89  Sodium 135 - 145 mmol/L 134(L) 135 140  Potassium 3.5 - 5.1 mmol/L 3.4(L) 3.3(L) 3.3(L)  Chloride 101 - 111 mmol/L 98(L) 100(L) 105  CO2 22 - 32 mmol/L  _1 Calcium 8.9 - 10.3 mg/dL 8.8(L) 8.7(L) 9.3   No results found.  Assessment/Plan:  Acute hypoxic respiratory failure One month progressive dyspnea on exertion, despite oxygen and increased lasix at home. Seen in clinic and desatting to 80s with respiratory distress on home oxygen with exertion. Also with LE edema, orthopnea and decreased urine output, has gained 10 lbs in 2 months. Also has not been using her inhalers for COPD, as she tried them once and did not find them helpful. Few days of URI symptoms, no changes on CXR, found to be Influenza A positive. Hypoxia etiology likely mutlifactorial - influenza, untreated COPD, component of CHF with weight gain, orthop, and edema. BNP 550.  -- Tamiflu 75 mg BID for 5 days, ending on 1/14 -- Duonebs Q8 scheduled plus Q4 PRN -- Guaifenesin-codeine cough syrup Q6H PRN, scheduled dosing likely causing her to feel "out of it" -- Check ambulatory SpO2 on home oxygen (2L) today, if stable can dc home -- Tylenol and Ibuprofen PRN for chest/stomach ache 2/2 cough -- TTE to eval CHF - improved from previous in 08/2016 --  Lasix 80 mg QD, will adjust to 40 BID on discharge with short term follow up -- Daily weights, strict I/Os -- Continue pulse ox and supplemental O2 as needed to maintain O2 sat > 49%  Combined systolic and diastolic heart failure: Last TTE 10/17 showing LVEF 40-45% and G1DD; symptoms of orthopnea, and BLE edema not relieved with increased diuretics at home. 1+ pitting edema bilaterally, but lungs sound clear. TTE on 1/11 revealed LVEF 50-55%, G1DD, PA peak pressure 39 improved from previous. -- Lasix 80 mg daily, will adjust to 40 BID on discharge -- Follow TTE, BNP, response to diuretics per above -- Trend BMP -- continue ARB / Irbesartan 150 mg daily and Carvedilol 12.86m BID; may require further optimization   Paroxysmal atrial fibrillation, CHA2DS2VASc Score 6: Patient with h/o PAF even before CABG with continued weekly  episodes of palpitations. She is compliant with her Eliquis. Was previously on amiodarone but now only rate controlled with coreg.  --continue Eliquis 573mBID --continue Carvedilol 12.20m77mID  COPD, PFT 05/2016 shows evidence of COPD with FEV1 of 62%, bronchodilator responsiveness, high ERV, and low DLCO. She was prescribed an albuterol inhaler and incruse inhaler recently, but not taking.  -- Duonebs and restart home inhalers per above  HypoK, 3.4 this AM - admin 1x KDur 40 mEq - Will Rx 20 mEq daily on discharge  HTN - continue home meds - Carvedilol, holding Valsartan in setting of diuresis -- normotensive, defer restarting Valsartan to outpatient follow up  GERD - continue home Protonix   Dispo: Anticipated discharge in approximately 1-2 day(s).   LOS: 3 days   AdaAsencion PartridgeD 11/27/2016, 11:52 AM Pager: [phone]

## 2016-11-27 NOTE — Telephone Encounter (Signed)
Hospital TOC.

## 2016-11-27 NOTE — Progress Notes (Signed)
  Date: 11/27/2016  Patient name: Elizabeth [name]  Medical record number: [id number]  Date of birth: [birth year]   I have seen and evaluated this patient and I have discussed the plan of care with the house staff. Please see Dr. Durenda Age note for complete details. I concur with his findings.    Ms. Blase is recovering from the flu.  She is going to be ambulated today to see how she does with her home O2 dose and to see if she needs increased dose while she recovers.  She can likely go home this afternoon.   Sid Falcon, MD 11/27/2016, 2:37 PM

## 2016-11-27 NOTE — Progress Notes (Signed)
Raenette P Eichorn to be D/C'd Home per MD order.  Discussed with the patient and all questions fully answered.  VSS, Skin clean, dry and intact without evidence of skin break down, no evidence of skin tears noted. IV catheter discontinued intact. Site without signs and symptoms of complications. Dressing and pressure applied.  An After Visit Summary was printed and given to the patient. Patient received prescription.  D/c education completed with patient/family including follow up instructions, medication list, d/c activities limitations if indicated, with other d/c instructions as indicated by MD - patient able to verbalize understanding, all questions fully answered. CCMD notified   Patient instructed to return to ED, call 911, or call MD for any changes in condition.   Patient escorted via [place], and D/C home via private auto.  Betha Loa Taler Kushner 11/27/2016 5:00 PM

## 2016-11-30 ENCOUNTER — Ambulatory Visit (HOSPITAL_COMMUNITY): Payer: Medicare Other

## 2016-12-02 ENCOUNTER — Ambulatory Visit (HOSPITAL_COMMUNITY): Payer: Medicare Other

## 2016-12-02 ENCOUNTER — Other Ambulatory Visit: Payer: Self-pay | Admitting: *Deleted

## 2016-12-02 NOTE — Patient Outreach (Addendum)
[facility]) Care Management  12/02/2016  Elizabeth [name] 07-13-1956 [id number]  Emmi-COPD referral with red on scheduled follow up & filled new prescriptions:  Telephone call to patient who was advised of reason for call.  States she does have COPD & was hospitalized because she has flu. Patient voices she didn't understand why questions were being asked. Stated she "didn't have time for this". Call was disconnected by patient. Patient did not allow assessment if there were needs.  Noted in chart that there was a scheduled appointment for 1/19 with [facility].  Patient  refused to complete call.  Plan: Case closed.   Sherrin Daisy, RN BSN [place] Management Coordinator [facility] Care Management  [phone]

## 2016-12-04 ENCOUNTER — Ambulatory Visit (HOSPITAL_COMMUNITY): Payer: Medicare Other

## 2016-12-04 ENCOUNTER — Ambulatory Visit: Payer: Medicare Other

## 2016-12-04 ENCOUNTER — Encounter: Payer: Self-pay | Admitting: Internal Medicine

## 2016-12-05 NOTE — Addendum Note (Signed)
Addended by: Meryl Dare on: 12/05/2016 10:21 AM   Modules accepted: Orders

## 2016-12-07 ENCOUNTER — Ambulatory Visit (HOSPITAL_COMMUNITY): Payer: Medicare Other

## 2016-12-08 ENCOUNTER — Ambulatory Visit (INDEPENDENT_AMBULATORY_CARE_PROVIDER_SITE_OTHER): Payer: Medicare Other | Admitting: Internal Medicine

## 2016-12-08 ENCOUNTER — Telehealth: Payer: Self-pay | Admitting: Pharmacist

## 2016-12-08 ENCOUNTER — Other Ambulatory Visit: Payer: Self-pay | Admitting: Internal Medicine

## 2016-12-08 ENCOUNTER — Encounter: Payer: Self-pay | Admitting: Internal Medicine

## 2016-12-08 VITALS — BP 170/82 | HR 68 | Temp 97.6°F | Ht 62.0 in | Wt 211.6 lb

## 2016-12-08 DIAGNOSIS — J449 Chronic obstructive pulmonary disease, unspecified: Secondary | ICD-10-CM | POA: Diagnosis not present

## 2016-12-08 DIAGNOSIS — J019 Acute sinusitis, unspecified: Secondary | ICD-10-CM

## 2016-12-08 DIAGNOSIS — I5042 Chronic combined systolic (congestive) and diastolic (congestive) heart failure: Secondary | ICD-10-CM | POA: Diagnosis not present

## 2016-12-08 DIAGNOSIS — I110 Hypertensive heart disease with heart failure: Secondary | ICD-10-CM

## 2016-12-08 DIAGNOSIS — Z9981 Dependence on supplemental oxygen: Secondary | ICD-10-CM

## 2016-12-08 DIAGNOSIS — Z09 Encounter for follow-up examination after completed treatment for conditions other than malignant neoplasm: Secondary | ICD-10-CM

## 2016-12-08 DIAGNOSIS — J9601 Acute respiratory failure with hypoxia: Secondary | ICD-10-CM

## 2016-12-08 DIAGNOSIS — Z8049 Family history of malignant neoplasm of other genital organs: Secondary | ICD-10-CM | POA: Diagnosis not present

## 2016-12-08 DIAGNOSIS — I480 Paroxysmal atrial fibrillation: Secondary | ICD-10-CM

## 2016-12-08 DIAGNOSIS — Z8709 Personal history of other diseases of the respiratory system: Secondary | ICD-10-CM

## 2016-12-08 DIAGNOSIS — I10 Essential (primary) hypertension: Secondary | ICD-10-CM

## 2016-12-08 DIAGNOSIS — Z8042 Family history of malignant neoplasm of prostate: Secondary | ICD-10-CM | POA: Diagnosis not present

## 2016-12-08 DIAGNOSIS — J101 Influenza due to other identified influenza virus with other respiratory manifestations: Secondary | ICD-10-CM

## 2016-12-08 DIAGNOSIS — Z7901 Long term (current) use of anticoagulants: Secondary | ICD-10-CM | POA: Diagnosis not present

## 2016-12-08 DIAGNOSIS — Z8249 Family history of ischemic heart disease and other diseases of the circulatory system: Secondary | ICD-10-CM | POA: Diagnosis not present

## 2016-12-08 DIAGNOSIS — B9689 Other specified bacterial agents as the cause of diseases classified elsewhere: Secondary | ICD-10-CM

## 2016-12-08 DIAGNOSIS — F17211 Nicotine dependence, cigarettes, in remission: Secondary | ICD-10-CM | POA: Diagnosis not present

## 2016-12-08 DIAGNOSIS — Z79899 Other long term (current) drug therapy: Secondary | ICD-10-CM | POA: Diagnosis not present

## 2016-12-08 MED ORDER — AMLODIPINE BESYLATE 5 MG PO TABS: 5.0000 mg | ORAL_TABLET | Freq: Every day | ORAL | 0 refills | Status: DC

## 2016-12-08 MED ORDER — GUAIFENESIN 200 MG PO TABS: 400.0000 mg | ORAL_TABLET | Freq: Four times a day (QID) | ORAL | 0 refills | Status: DC | PRN

## 2016-12-08 MED ORDER — ALBUTEROL SULFATE HFA 108 (90 BASE) MCG/ACT IN AERS: 2.0000 | INHALATION_SPRAY | Freq: Four times a day (QID) | RESPIRATORY_TRACT | 2 refills | Status: AC | PRN

## 2016-12-08 MED ORDER — AMOXICILLIN-POT CLAVULANATE 875-125 MG PO TABS: 1.0000 | ORAL_TABLET | Freq: Two times a day (BID) | ORAL | 0 refills | Status: AC

## 2016-12-08 MED ORDER — NAPROXEN 500 MG PO TABS: 500.0000 mg | ORAL_TABLET | Freq: Every day | ORAL | 0 refills | Status: DC

## 2016-12-08 NOTE — Assessment & Plan Note (Addendum)
Pt reports symptoms much improved since hospitalization. She was treated with diuresis and is now on BID 38m furosemide. Wt 211 today, DC wt was 208. Will continue to monitor. Pt reports she will be getting scale at home soon. Given return precautions. Has f/u w/ cardiology.  Plan: Continue BID 481mfurosemide. Anticipatory guidance for wt gain and SOB.

## 2016-12-08 NOTE — Progress Notes (Signed)
Case discussed with Dr. Gay Filler at time of visit.  We reviewed the resident's history and exam and pertinent patient test results.  I agree with the assessment, diagnosis, and plan of care documented in the resident's note.

## 2016-12-08 NOTE — Assessment & Plan Note (Signed)
Pt O2 sats doing well today. SOB improved.  Plan: Treat COPD, CHF, sinusitis as described.

## 2016-12-08 NOTE — Assessment & Plan Note (Addendum)
No wheezing on exam today. Mild SOB w/ exertion. Taking Incurse Ellipta daily. Needs refill of albuterol inhaler PRN. Treated conservatively during last hospitalization w/o Abx or steroids. No need for steroids currently.  Plan: Continue Incruse Ellipta. Refill albuterol. Treat sinusitis. Guaifenesin for mucolytic.

## 2016-12-08 NOTE — Progress Notes (Signed)
CC: HFU HPI: [name] is a 61 y.o. female with a h/o of combined systolic/diastolic HF, PAF, COPD on home O2, HTN who presents after hospitalization for acute hypoxic respiratory failure 2/2 influenza A infection, COPD, and HF exacerbation.  Please see Problem-based charting for HPI and the status of patient's chronic medical conditions.  Past Medical History:  Diagnosis Date  . Asthma   . CAD (coronary artery disease)    NSTEMI 08/2011:  LHC 08/21/11: mLAD 60-70%, pCFX occluded, dRCA chronic occlusion with L-R collats, EF 40-45%, inf AK.  PCI:  BMS to CFX.  Marland Kitchen COPD (chronic obstructive pulmonary disease) ([place]) 11/17/2016  . Depression   . Fibroids   . Fibromyalgia   . GERD (gastroesophageal reflux disease)   . Gout   . History of microcytic hypochromic anemia   . HLD (hyperlipidemia)    Chol = 235, LDL = 156 (08/2010)  . Hypertension   . Insomnia   . Lupus 2009   ANA + 10/2008, repeat ANA + (05/2009), on that visit 05/2009 following labs obtained RF <20, CRP <0.4, ANA titers 1:80,   . Myocardial infarction 1990s  . On home oxygen therapy    "2L; 24/7 as of today" (11/24/2016)  . Pulmonary nodules 05/2008   noted on CXR and CT 05/2008, repeat CT 10/2008 - Stable small bilateral pulmonary nodules measuring up to 6 m m  . Shortness of breath 08/22/2013  . Stroke [facility]) 04/2014   "mini stroke"  . Substance abuse    cocaine    Social History  Substance Use Topics  . Smoking status: Former Smoker    Packs/day: 0.12    Years: 45.00    Types: Cigarettes    Quit date: 11/16/2016  . Smokeless tobacco: Never Used  . Alcohol use 18.0 oz/week    7 Standard drinks or equivalent, 23 Cans of beer per week     Comment: 11/24/2016 "beer 1-2  32-40oz beers/day per day"   Family History  Problem Relation Age of Onset  . Heart disease Mother   . Cervical cancer Sister   . Prostate cancer Brother   . Colon cancer Neg Hx    Review of Systems: ROS in HPI.  Otherwise: ROS Physical Exam: Vitals:   12/08/16 1054  BP: (!) 170/82  Pulse: 68  Temp: 97.6 F (36.4 C)  TempSrc: Oral  SpO2: 97%  Weight: 211 lb 9.6 oz (96 kg)  Height: 5' 2" (1.575 m)   Physical Exam  Assessment & Plan:  See encounters tab for problem based medical decision making. Patient discussed with Dr. Eppie Gibson  Paroxysmal atrial fibrillation [facility]) Pt reports occasional palpitations. Denies any dizziness, SOB, anxiety associated with these events. Compliant w/ Eliquis. No easy bruising or bleeding.  Plan: Continue Eliquis.  Influenza A Pt finished course of Tamiflu. She was recently hospitalized for acute respiratory failure thought to be multifactorial with flu being implicated. She reports that he breathing is much improved. She still complains of some sinus pain and pressure on the left side w/ thick mucous drainage from the nose. She has persistent cough w/ occasional thick sputum production.  Plan: Concerned for secondary bacterial infection. See A&P below.   Essential hypertension BP elevated today at 170/82. Currently taking Coreg and Furosemide, no other antihypertensives. Denies CP, but does have sinus HA. No changes in vision.  Plan: Add amlodipine 67m qD. F/u w/ PCP.  COPD (chronic obstructive pulmonary disease) (HCC) No wheezing on exam today. Mild SOB  w/ exertion. Taking Incurse Ellipta daily. Needs refill of albuterol inhaler PRN. Treated conservatively during last hospitalization w/o Abx or steroids. No need for steroids currently.  Plan: Continue Incruse Ellipta. Refill albuterol. Treat sinusitis. Guaifenesin for mucolytic.  Chronic combined systolic and diastolic congestive heart failure ([place]) Pt reports symptoms much improved since hospitalization. She was treated with diuresis and is now on BID 43m furosemide. Wt 211 today, DC wt was 208. Will continue to monitor. Pt reports she will be getting scale at home soon. Given return precautions. Has  f/u w/ cardiology.  Plan: Continue BID 478mfurosemide. Anticipatory guidance for wt gain and SOB.  Acute respiratory failure with hypoxia (HCC) Pt O2 sats doing well today. SOB improved.  Plan: Treat COPD, CHF, sinusitis as described.   Signed: BrHolley RaringMD 12/08/2016, 11:41 AM  Pager: [phone]

## 2016-12-08 NOTE — Assessment & Plan Note (Signed)
BP elevated today at 170/82. Currently taking Coreg and Furosemide, no other antihypertensives. Denies CP, but does have sinus HA. No changes in vision.  Plan: Add amlodipine 15m qD. F/u w/ PCP.

## 2016-12-08 NOTE — Patient Instructions (Addendum)
Continue to take all your medications, specifically your fluid pill every day. It will be important that your monitor your wt at home and call our clinic if you notice more than a 2 pound wt gain in a day or 5 pounds in a week.  I will prescribe an antibiotic for your sinus infection and a medicine called Guaifenesin to help break up the thick mucous.  Please let me know if you symptoms do not start to improve in the next 5-7 days.  We will also add a medication for your blood pressure called Amlodipine. Take this medication every day.

## 2016-12-08 NOTE — Assessment & Plan Note (Signed)
Pt reports occasional palpitations. Denies any dizziness, SOB, anxiety associated with these events. Compliant w/ Eliquis. No easy bruising or bleeding.  Plan: Continue Eliquis.

## 2016-12-08 NOTE — Assessment & Plan Note (Addendum)
Pt finished course of Tamiflu. She was recently hospitalized for acute respiratory failure thought to be multifactorial with flu being implicated. She reports that he breathing is much improved. She still complains of some sinus pain and pressure on the left side w/ thick mucous drainage from the nose. She has persistent cough w/ occasional thick sputum production.  Plan: Concerned for secondary bacterial infection. See A&P below.

## 2016-12-09 ENCOUNTER — Telehealth: Payer: Self-pay | Admitting: Internal Medicine

## 2016-12-09 ENCOUNTER — Ambulatory Visit (HOSPITAL_COMMUNITY): Payer: Medicare Other

## 2016-12-09 NOTE — Telephone Encounter (Signed)
Called for TOC, someone answered and hung up

## 2016-12-09 NOTE — Telephone Encounter (Signed)
Calling states someone called but doesn't know who. Can you see who it may be.

## 2016-12-10 ENCOUNTER — Other Ambulatory Visit: Payer: Self-pay | Admitting: Pulmonary Disease

## 2016-12-10 NOTE — Telephone Encounter (Signed)
done

## 2016-12-10 NOTE — Telephone Encounter (Signed)
Visit to [facility] Tuesday, she answered ph today, states her visit was good and she is doing well, close TOC

## 2016-12-11 ENCOUNTER — Ambulatory Visit (HOSPITAL_COMMUNITY): Payer: Medicare Other

## 2016-12-14 ENCOUNTER — Ambulatory Visit (HOSPITAL_COMMUNITY): Payer: Medicare Other

## 2016-12-14 NOTE — Telephone Encounter (Signed)
Contacted patient to help with medications, unable to reach

## 2016-12-16 ENCOUNTER — Ambulatory Visit (HOSPITAL_COMMUNITY): Payer: Medicare Other

## 2016-12-18 ENCOUNTER — Ambulatory Visit (HOSPITAL_COMMUNITY): Payer: Medicare Other

## 2016-12-18 ENCOUNTER — Ambulatory Visit (INDEPENDENT_AMBULATORY_CARE_PROVIDER_SITE_OTHER): Payer: Medicare Other | Admitting: Pulmonary Disease

## 2016-12-18 ENCOUNTER — Telehealth: Payer: Self-pay | Admitting: Pulmonary Disease

## 2016-12-18 ENCOUNTER — Other Ambulatory Visit: Payer: Self-pay | Admitting: Internal Medicine

## 2016-12-18 ENCOUNTER — Encounter: Payer: Self-pay | Admitting: Pulmonary Disease

## 2016-12-18 VITALS — BP 144/80 | HR 75 | Ht 62.0 in | Wt 214.2 lb

## 2016-12-18 DIAGNOSIS — J449 Chronic obstructive pulmonary disease, unspecified: Secondary | ICD-10-CM

## 2016-12-18 DIAGNOSIS — I5032 Chronic diastolic (congestive) heart failure: Secondary | ICD-10-CM

## 2016-12-18 DIAGNOSIS — I10 Essential (primary) hypertension: Secondary | ICD-10-CM

## 2016-12-18 NOTE — Telephone Encounter (Signed)
Spoke with the pt  She states that the inhaler that she has that we did not know about is proventil hfa  I advised that this is the same thing as her ventolin hfa inhaler and she should pick one or the other to use PRN  She does not need both together  She verbalized understanding  Nothing further needed

## 2016-12-18 NOTE — Patient Instructions (Signed)
You will need to use her oxygen 24/7. We put in an order for a portable oxygen concentrator Continue using year in Incruz and albuterol rescue inhaler. Please call us back to let us know the name of the third inhaler you are on. Continue to work on his smoking cessation  Return to clinic in 1-2 months.

## 2016-12-18 NOTE — Progress Notes (Signed)
Elizabeth [name]    [id number]    [birth year]  Primary Care Physician:Nina Hetty Ely, MD  Referring Physician: Ledell Noss, MD [address] [place],  [address]  Chief complaint:  Consult for evaluation of COPD GOLD B (CAT score 30, 1 exacerbation over the past year, FEV1 56%)  HPI: 61 year old with past medical history of heart failure, coronary artery disease s/p CABG in July 2017 paroxysmal A. fib, COPD, hypertension. She complaints of ongoing dyspnea on exertion since the CABG surgery. She was sent home at that time on home oxygen which she weaned herself off. However she has worsening of her hypoxia and was restarted on 2 L 24/7 home oxygen in January She was admitted in early January for acute on chronic respiratory failure secondary to flu a infection and heart failure.  In the office today she complains of chronic dyspnea on exertion. She denies any cough, sputum production, wheezing has a heavy smoking history and quit in early January after her hospitalization. As per records she is on Incruz and albuterol but she claims that she is on a third inhaler the name of which she cannot recall.  Outpatient Encounter Prescriptions as of 12/18/2016  Medication Sig  . albuterol (PROVENTIL HFA;VENTOLIN HFA) 108 (90 Base) MCG/ACT inhaler Inhale 2 puffs into the lungs every 6 (six) hours as needed for wheezing or shortness of breath.  Marland Kitchen amLODipine (NORVASC) 5 MG tablet TAKE 1 TABLET BY MOUTH EVERY DAY  . apixaban (ELIQUIS) 5 MG TABS tablet Take 5 mg by mouth daily.   . carvedilol (COREG) 12.5 MG tablet TAKE 1 TABLET(12.5 MG) BY MOUTH TWICE DAILY  . furosemide (LASIX) 40 MG tablet Take 1 tablet (40 mg total) by mouth 2 (two) times daily.  . naproxen (NAPROSYN) 500 MG tablet TAKE 1 TABLET BY MOUTH AT BEDTIME  . pantoprazole (PROTONIX) 40 MG tablet TAKE 1 TABLET(40 MG) BY MOUTH DAILY  . potassium chloride SA (K-DUR,KLOR-CON) 20 MEQ tablet Take 1 tablet (20 mEq total) by mouth daily.    Marland Kitchen umeclidinium bromide (INCRUSE ELLIPTA) 62.5 MCG/INH AEPB Inhale 1 puff into the lungs daily.  . [DISCONTINUED] acetaminophen (TYLENOL) 325 MG tablet Take 2 tablets (650 mg total) by mouth every 6 (six) hours as needed for mild pain, fever or headache. (Patient not taking: Reported on 11/24/2016)  . [DISCONTINUED] aspirin EC 81 MG EC tablet Take 1 tablet (81 mg total) by mouth daily. (Patient not taking: Reported on 11/24/2016)  . [DISCONTINUED] atorvastatin (LIPITOR) 80 MG tablet TAKE 1 TABLET(80 MG) BY MOUTH DAILY (Patient not taking: Reported on 11/24/2016)  . [DISCONTINUED] guaiFENesin 200 MG tablet Take 2 tablets (400 mg total) by mouth every 6 (six) hours as needed for cough or to loosen phlegm.   No facility-administered encounter medications on file as of 12/18/2016.     Allergies as of 12/18/2016  . (No Known Allergies)    Past Medical History:  Diagnosis Date  . Asthma   . CAD (coronary artery disease)    NSTEMI 08/2011:  LHC 08/21/11: mLAD 60-70%, pCFX occluded, dRCA chronic occlusion with L-R collats, EF 40-45%, inf AK.  PCI:  BMS to CFX.  Marland Kitchen COPD (chronic obstructive pulmonary disease) ([place]) 11/17/2016  . Depression   . Fibroids   . Fibromyalgia   . GERD (gastroesophageal reflux disease)   . Gout   . History of microcytic hypochromic anemia   . HLD (hyperlipidemia)    Chol = 235, LDL = 156 (  08/2010)  . Hypertension   . Insomnia   . Lupus 2009   ANA + 10/2008, repeat ANA + (05/2009), on that visit 05/2009 following labs obtained RF <20, CRP <0.4, ANA titers 1:80,   . Myocardial infarction 1990s  . On home oxygen therapy    "2L; 24/7 as of today" (11/24/2016)  . Pulmonary nodules 05/2008   noted on CXR and CT 05/2008, repeat CT 10/2008 - Stable small bilateral pulmonary nodules measuring up to 6 m m  . Shortness of breath 08/22/2013  . Stroke [facility]) 04/2014   "mini stroke"  . Substance abuse    cocaine     Past Surgical History:  Procedure Laterality Date  . CARDIAC  CATHETERIZATION N/A 06/02/2016   Procedure: Left Heart Cath and Coronary Angiography;  Surgeon: Belva Crome, MD;  Location: [place] [facility];  Service: Cardiovascular;  Laterality: N/A;  . COLONOSCOPY N/A 07/14/2013   Procedure: COLONOSCOPY;  Surgeon: Beryle Beams, MD;  [facility]: [facility];  Service: Endoscopy;  Laterality: N/A;  . CORONARY ANGIOPLASTY WITH STENT PLACEMENT    . CORONARY ARTERY BYPASS GRAFT N/A 06/08/2016   Procedure: CORONARY ARTERY BYPASS GRAFTING (CABG) X 4  With Right Greater Saphenous vein endoscopic harvesting;  Surgeon: [name], MD;  Location: [place];  Service: Open Heart Surgery;  Laterality: N/A;  . FRACTURE SURGERY    . ORIF TIBIA PLATEAU Left 08/22/2013   Procedure: OPEN REDUCTION INTERNAL FIXATION (ORIF) TIBIAL PLATEAU;  Surgeon: Renette Butters, MD;  Location: [place];  Service: Orthopedics;  Laterality: Left;  . TEE WITHOUT CARDIOVERSION N/A 06/08/2016   Procedure: TRANSESOPHAGEAL ECHOCARDIOGRAM (TEE);  Surgeon: [name], MD;  Location: [place];  Service: Open Heart Surgery;  Laterality: N/A;  . TUBAL LIGATION    . VAGINAL HYSTERECTOMY  05/2003    Family History  Problem Relation Age of Onset  . Heart disease Mother   . Cervical cancer Sister   . Prostate cancer Brother   . Colon cancer Neg Hx     Social History   Social History  . Marital status: Divorced    Spouse name: N/A  . Number of children: N/A  . Years of education: N/A   Occupational History  . Not on file.   Social History Main Topics  . Smoking status: Former Smoker    Packs/day: 0.12    Years: 45.00    Types: Cigarettes    Quit date: 11/16/2016  . Smokeless tobacco: Never Used  . Alcohol use 18.0 oz/week    7 Standard drinks or equivalent, 23 Cans of beer per week     Comment: 11/24/2016 "beer 1-2  32-40oz beers/day per day"  . Drug use: Yes    Types: Cocaine     Comment: last used cocaine less than a month ago  . Sexual activity: Not Currently   Other Topics  Concern  . Not on file   Social History Narrative  . No narrative on file   Review of systems: Review of Systems  Constitutional: Negative for fever and chills.  HENT: Negative.   Eyes: Negative for blurred vision.  Respiratory: as per HPI  Cardiovascular: Negative for chest pain and palpitations.  Gastrointestinal: Negative for vomiting, diarrhea, blood per rectum. Genitourinary: Negative for dysuria, urgency, frequency and hematuria.  Musculoskeletal: Negative for myalgias, back pain and joint pain.  Skin: Negative for itching and rash.  Neurological: Negative for dizziness, tremors, focal weakness, seizures and loss of consciousness.  Endo/Heme/Allergies:  Negative for environmental allergies.  Psychiatric/Behavioral: Negative for depression, suicidal ideas and hallucinations.  All other systems reviewed and are negative.  Physical Exam: Blood pressure (!) 144/80, pulse 75, height _0  (1.575 m), weight 214 lb 3.2 oz (97.2 kg), last menstrual period 12/30/1994, SpO2 97 %. Gen:      No acute distress HEENT:  EOMI, sclera anicteric Neck:     No masses; no thyromegaly Lungs:    Clear to auscultation bilaterally; normal respiratory effort CV:         Regular rate and rhythm; no murmurs Abd:      + bowel sounds; soft, non-tender; no palpable masses, no distension Ext:    No edema; adequate peripheral perfusion Skin:      Warm and dry; no rash Neuro: alert and oriented x 3 Psych: normal mood and affect  Data Reviewed: PFTs 06/04/16 FVC 1.89 (76%) FEV1 1.36 (69%) F/F 72 TLC 78% DLCO 44% Moderate obstruction, minimal restriction, severe diffusion defect  CT scan 11/24/11-stable subcentimeter pulmonary nodules compared to 2009 Chest x-ray 11/26/16-no pneumonia, effusion. Probable mild chronic fibrotic changes All images reviewed.  Assessment:  COPD GOLD B  She was hypoxic off oxygen in office today which recovered with O2. I reiterated to her that she needs to be on oxygen all  the time. I'll put in an order for a portable oxygen concentrator.  As per records she is on Incruz and albuterol but she claims that she is using a third inhaler. She may benefit from a steroid/LAMA inhaler but we wil hold off on starting till we clarify from pharmacy what the third inhaler is.  Former smoker She has been off cigarettes for the past 3 weeks. I congratulated her and encouraged her to continue to abstain.  Plan/Recommendations: - Continue O2 24/7 - Clarify what the third inhaler is - Smoking cessation.  Return in 1 month  Marshell Garfinkel MD [facility] Pager [phone] 12/18/2016, 10:33 AM  CC: Ledell Noss, MD

## 2016-12-21 ENCOUNTER — Ambulatory Visit (HOSPITAL_COMMUNITY): Payer: Medicare Other

## 2016-12-21 ENCOUNTER — Other Ambulatory Visit: Payer: Self-pay | Admitting: Internal Medicine

## 2016-12-21 DIAGNOSIS — H401131 Primary open-angle glaucoma, bilateral, mild stage: Secondary | ICD-10-CM | POA: Diagnosis not present

## 2016-12-22 ENCOUNTER — Encounter: Payer: Self-pay | Admitting: Cardiovascular Disease

## 2016-12-22 ENCOUNTER — Ambulatory Visit (INDEPENDENT_AMBULATORY_CARE_PROVIDER_SITE_OTHER): Payer: Medicare Other | Admitting: Cardiovascular Disease

## 2016-12-22 VITALS — BP 142/70 | HR 68 | Ht 62.0 in | Wt 211.8 lb

## 2016-12-22 DIAGNOSIS — I2510 Atherosclerotic heart disease of native coronary artery without angina pectoris: Secondary | ICD-10-CM

## 2016-12-22 DIAGNOSIS — I5042 Chronic combined systolic (congestive) and diastolic (congestive) heart failure: Secondary | ICD-10-CM

## 2016-12-22 DIAGNOSIS — I10 Essential (primary) hypertension: Secondary | ICD-10-CM | POA: Diagnosis not present

## 2016-12-22 DIAGNOSIS — I480 Paroxysmal atrial fibrillation: Secondary | ICD-10-CM | POA: Diagnosis not present

## 2016-12-22 DIAGNOSIS — L910 Hypertrophic scar: Secondary | ICD-10-CM | POA: Diagnosis not present

## 2016-12-22 DIAGNOSIS — Z7901 Long term (current) use of anticoagulants: Secondary | ICD-10-CM

## 2016-12-22 DIAGNOSIS — E785 Hyperlipidemia, unspecified: Secondary | ICD-10-CM

## 2016-12-22 NOTE — Patient Instructions (Signed)
Medication Instructions: PLEASE CALL with the name of the cholesterol medication you take and the strength of the Valsartan. [phone]  Labwork: Your physician recommends that you return for lab work at the end of the month - FASTING.  Testing/Procedures: NONE ORDERED  Follow-up: Dr Sallyanne Kuster recommends that you schedule a follow-up appointment in July 2018. You will receive a reminder letter in the mail two months in advance. If you don't receive a letter, please call our office to schedule the follow-up appointment.  If you need a refill on your cardiac medications before your next appointment, please call your pharmacy.   You have been referred to Dr Audelia Hives for your keloid scar.

## 2016-12-22 NOTE — Progress Notes (Signed)
Cardiology Office Note    Date:  12/24/2016   ID:  Elizabeth, [name] 01/08/1956, MRN [id number]  PCP:  Ledell Noss, MD  Cardiologist:   Sanda Klein, MD   Chief Complaint  Patient presents with  . Follow-up    pt c/o sharp CP, happens randomly, at rest or exertion; occasional palpitations--unassociated w/ pain; dizziness last sunday when she stood up; SOB--on oxygen; fatigue, but not sleeping well    History of Present Illness:  Elizabeth [name] is a 61 y.o. female coronary artery disease now roughly 3 months after CABG with hyperlipidemia, hypertension, chronic diastolic heart failure, remote history of cocaine abuse, pulmonary nodules, lupus and TIA.  Her blood pressure is borderline high today but is usually lower at home. She is not smoking.  She has a keloid scar towards the cranial segment of the sternotomy and this causes a tugging feeling when she turns her head which is very annoying. She would like to see a Psychiatric nurse, but overall feels well. She denies exertional dyspnea and has not had angina pectoris, palpitations or any focal neurological complaints. She denies leg edema, orthopnea, PND or any serious bleeding.   She underwent a cardiac cath in 08/2011 in the setting of NSTEMI and cocaine use cath showedchronic total occlusion of the proximal right coronary artery with collateral flow, long 60%-70% stenosis in the mid LAD, left circumflex coronary artery  occluded proximally with a hang-up suggesting acute lesion which received a bare-metal stent. Myoview 09/2015 showed fixed defect involving the inferolateral and anterolateral myocardium consistent with old MI, LV systolic dysfunction (86%).  In July 2017 she presented with unstable angina and nuclear stress test showed the old lateral infarction and anterior ischemia and the cath showed 70-80 percent mid LAD obstruction with high-grade stenoses in both diagonal arteries and 85-90 percent stenosis in the left  circumflex and oblique marginal branch downstream of the old stent. EF 40-45%. LVEDP elevated at 22 mm Hg. On 06/08/2016 underwent four-vessel bypass surgery with Dr. Servando Snare: LIMA to LAD, sequential SVG to OM and distal circumflex and SVG to distal RCA. Postop had moderate anemia and recurrent atrial fibrillation requiring amiodarone. She was discharged on home oxygen but is gradually weaning off. She has been receiving home physical therapy. No significant carotid lesions by duplex ultrasound in July 2017.   Past Medical History:  Diagnosis Date  . Asthma   . CAD (coronary artery disease)    NSTEMI 08/2011:  LHC 08/21/11: mLAD 60-70%, pCFX occluded, dRCA chronic occlusion with L-R collats, EF 40-45%, inf AK.  PCI:  BMS to CFX.  Marland Kitchen COPD (chronic obstructive pulmonary disease) ([place]) 11/17/2016  . Depression   . Fibroids   . Fibromyalgia   . GERD (gastroesophageal reflux disease)   . Gout   . History of microcytic hypochromic anemia   . HLD (hyperlipidemia)    Chol = 235, LDL = 156 (08/2010)  . Hypertension   . Insomnia   . Lupus 2009   ANA + 10/2008, repeat ANA + (05/2009), on that visit 05/2009 following labs obtained RF <20, CRP <0.4, ANA titers 1:80,   . Myocardial infarction 1990s  . On home oxygen therapy    "2L; 24/7 as of today" (11/24/2016)  . Pulmonary nodules 05/2008   noted on CXR and CT 05/2008, repeat CT 10/2008 - Stable small bilateral pulmonary nodules measuring up to 6 m m  . Shortness of breath 08/22/2013  . Stroke [facility]) 04/2014   "mini stroke"  .  Substance abuse    cocaine     Past Surgical History:  Procedure Laterality Date  . CARDIAC CATHETERIZATION N/A 06/02/2016   Procedure: Left Heart Cath and Coronary Angiography;  Surgeon: Belva Crome, MD;  Location: [place] [facility];  Service: Cardiovascular;  Laterality: N/A;  . COLONOSCOPY N/A 07/14/2013   Procedure: COLONOSCOPY;  Surgeon: Beryle Beams, MD;  [facility];  Service: Endoscopy;  Laterality:  N/A;  . CORONARY ANGIOPLASTY WITH STENT PLACEMENT    . CORONARY ARTERY BYPASS GRAFT N/A 06/08/2016   Procedure: CORONARY ARTERY BYPASS GRAFTING (CABG) X 4  With Right Greater Saphenous vein endoscopic harvesting;  Surgeon: [name], MD;  Location: [place];  Service: Open Heart Surgery;  Laterality: N/A;  . FRACTURE SURGERY    . ORIF TIBIA PLATEAU Left 08/22/2013   Procedure: OPEN REDUCTION INTERNAL FIXATION (ORIF) TIBIAL PLATEAU;  Surgeon: Renette Butters, MD;  Location: [place];  Service: Orthopedics;  Laterality: Left;  . TEE WITHOUT CARDIOVERSION N/A 06/08/2016   Procedure: TRANSESOPHAGEAL ECHOCARDIOGRAM (TEE);  Surgeon: [name], MD;  Location: [facility];  Service: Open Heart Surgery;  Laterality: N/A;  . TUBAL LIGATION    . VAGINAL HYSTERECTOMY  05/2003    Current Medications: Outpatient Medications Prior to Visit  Medication Sig Dispense Refill  . albuterol (PROVENTIL HFA;VENTOLIN HFA) 108 (90 Base) MCG/ACT inhaler Inhale 2 puffs into the lungs every 6 (six) hours as needed for wheezing or shortness of breath. 1 Inhaler 2  . amLODipine (NORVASC) 5 MG tablet TAKE 1 TABLET BY MOUTH EVERY DAY 90 tablet 0  . apixaban (ELIQUIS) 5 MG TABS tablet Take 5 mg by mouth daily.     . carvedilol (COREG) 12.5 MG tablet TAKE 1 TABLET(12.5 MG) BY MOUTH TWICE DAILY 180 tablet 3  . furosemide (LASIX) 20 MG tablet Take 2 tablets (40 mg total) by mouth 2 (two) times daily. 180 tablet 3  . naproxen (NAPROSYN) 500 MG tablet TAKE 1 TABLET BY MOUTH AT BEDTIME 90 tablet 0  . pantoprazole (PROTONIX) 40 MG tablet TAKE 1 TABLET(40 MG) BY MOUTH DAILY 90 tablet 3  . potassium chloride SA (K-DUR,KLOR-CON) 20 MEQ tablet Take 1 tablet (20 mEq total) by mouth daily. 90 tablet 3  . umeclidinium bromide (INCRUSE ELLIPTA) 62.5 MCG/INH AEPB Inhale 1 puff into the lungs daily. 7 each 4   No facility-administered medications prior to visit.      Allergies:   Patient has no known allergies.   Social History    Social History  . Marital status: Divorced    Spouse name: N/A  . Number of children: N/A  . Years of education: N/A   Social History Main Topics  . Smoking status: Former Smoker    Packs/day: 0.12    Years: 45.00    Types: Cigarettes    Quit date: 11/16/2016  . Smokeless tobacco: Never Used  . Alcohol use 18.0 oz/week    7 Standard drinks or equivalent, 23 Cans of beer per week     Comment: 11/24/2016 "beer 1-2  32-40oz beers/day per day"  . Drug use: Yes    Types: Cocaine     Comment: last used cocaine less than a month ago  . Sexual activity: Not Currently   Other Topics Concern  . None   Social History Narrative  . None     Family History:  The patient's family history includes Cervical cancer in her sister; Heart disease in her mother; Prostate cancer  in her brother.   ROS:   Please see the history of present illness.    ROS All other systems reviewed and are negative.   PHYSICAL EXAM:   VS:  BP (!) 142/70 (BP Location: Left Arm, Patient Position: Sitting, Cuff Size: Normal)   Pulse 68   Ht _0  (1.575 m)   Wt 96.1 kg (211 lb 12.8 oz)   LMP 12/30/1994   SpO2 91%   BMI 38.74 kg/m    GEN: Well nourished, well developed, in no acute distress  HEENT: normal  Neck: no JVD, carotid bruits, or masses Cardiac: Sternotomy scar is healed with some keloid formation at the upper end, RRR; no murmurs, rubs, or gallops,no edema  Respiratory:  clear to auscultation bilaterally, normal work of breathing GI: soft, nontender, nondistended, + BS MS: no deformity or atrophy  Skin: warm and dry, no rash Neuro:  Alert and Oriented x 3, Strength and sensation are intact Psych: euthymic mood, full affect  Wt Readings from Last 3 Encounters:  12/22/16 96.1 kg (211 lb 12.8 oz)  12/18/16 97.2 kg (214 lb 3.2 oz)  12/08/16 96 kg (211 lb 9.6 oz)      Studies/Labs Reviewed:   EKG:  EKG is ordered today. It shows normal sinus rhythm with minor ST-T changes in the inferior leads  as well as in V3-V6, QTC 482 ms  Recent Labs: 06/10/2016: TSH 2.779 11/24/2016: ALT 23 11/25/2016: B Natriuretic Peptide 525.4 11/26/2016: Hemoglobin 12.0; Magnesium 1.9; Platelets 226 11/27/2016: BUN 16; Creatinine, Ser 0.99; Potassium 3.4; Sodium 134   Lipid Panel    Component Value Date/Time   CHOL 163 08/18/2016 1156   CHOL 236 (H) 08/19/2015 0941   TRIG 108 08/18/2016 1156   HDL 41 (L) 08/18/2016 1156   HDL 49 08/19/2015 0941   CHOLHDL 4.0 08/18/2016 1156   VLDL 22 08/18/2016 1156   LDLCALC 100 08/18/2016 1156   LDLCALC 159 (H) 08/19/2015 0941   LDLDIRECT 145.2 10/29/2011 1618    Additional studies/ records that were reviewed today include:  Notes from Dr. Servando Snare    ASSESSMENT:    1. Chronic combined systolic and diastolic congestive heart failure (HCC)   2. Paroxysmal atrial fibrillation ([place])   3. Long term current use of anticoagulant   4. Coronary artery disease involving native coronary artery of native heart without angina pectoris   5. Dyslipidemia   6. Essential hypertension   7. Scarring, keloid      PLAN:  In order of problems listed above:  1. CHF: Clinically euvolemic, NYHA functional class I Preoperatively left ventricular ejection fraction was depressed at around 45% and as suspected there was no  major improvement on the postoperative echo, since she has evidence of lateral wall scar by previous nuclear stress testing. Continue treatment with carvedilol. Switched from ACE inhibitor to ARB due to cough. Although it is not on her list she is confident that she is taking valsartan 320 mg daily. 2. AFib: She had atrial fibrillation even before her bypass surgery and has a previous history of TIA. We'll discontinue amiodarone since the arrhythmia seems to have settled down clinically. 3. Anticoagulation: she needs to remain on lifelong anticoagulation. CHADSVasc 5 (TIA 2, gender, hypertension, CAD). 4. CAD s/p CABG: Recovering well after bypass surgery. No  angina pectoris.  5. HLP: On high-dose statin (again this is missing from her list but she is confident she is taking it, will call us when she gets home), reevaluate lipids. Target LDL<70.  6. HTN:  Borderline elevated diastolic blood pressure today. To confirm that she is definitely taking the valsartan. 7. Keloid scar:  Refer to Dr. Audelia Hives. The scar is not only aesthetically displeasing, but is also causing discomfort.    Medication Adjustments/Labs and Tests Ordered: Current medicines are reviewed at length with the patient today.  Concerns regarding medicines are outlined above.  Medication changes, Labs and Tests ordered today are listed in the Patient Instructions below. Patient Instructions  Medication Instructions: PLEASE CALL with the name of the cholesterol medication you take and the strength of the Valsartan. [phone]  Labwork: Your physician recommends that you return for lab work at the end of the month - FASTING.  Testing/Procedures: NONE ORDERED  Follow-up: Dr Sallyanne Kuster recommends that you schedule a follow-up appointment in July 2018. You will receive a reminder letter in the mail two months in advance. If you don't receive a letter, please call our office to schedule the follow-up appointment.  If you need a refill on your cardiac medications before your next appointment, please call your pharmacy.   You have been referred to Dr Audelia Hives for your keloid scar.    Signed, Sanda Klein, MD  12/24/2016 2:42 PM    [facility] [place], [place], [place]  [address] Phone: [phone]; Fax: [phone]

## 2016-12-23 ENCOUNTER — Ambulatory Visit (HOSPITAL_COMMUNITY): Payer: Medicare Other

## 2016-12-23 ENCOUNTER — Ambulatory Visit: Payer: Medicare Other

## 2016-12-23 ENCOUNTER — Telehealth: Payer: Self-pay | Admitting: Cardiovascular Disease

## 2016-12-23 NOTE — Telephone Encounter (Signed)
New Message    Per pt Dr. Orene Desanctis was unsure of the Milligrams patient was taking for both medications, and advised her to call back with information.  Valsartin 320 MG  Atorvastatin 80MG

## 2016-12-23 NOTE — Telephone Encounter (Signed)
Spoke to caller, she voiced that she was just updating [place] as FYI concerning daily valsartan 355m and atorvastatin 816m I've updated med list and will let physician be aware, as this was requested when she was seen yesterday in office.

## 2016-12-23 NOTE — Telephone Encounter (Signed)
Thank you!

## 2016-12-25 ENCOUNTER — Ambulatory Visit (HOSPITAL_COMMUNITY): Payer: Medicare Other

## 2016-12-25 ENCOUNTER — Ambulatory Visit: Payer: Medicare Other | Admitting: Cardiovascular Disease

## 2016-12-28 ENCOUNTER — Ambulatory Visit (HOSPITAL_COMMUNITY): Payer: Medicare Other

## 2016-12-30 ENCOUNTER — Ambulatory Visit (HOSPITAL_COMMUNITY): Payer: Medicare Other

## 2017-01-01 ENCOUNTER — Telehealth: Payer: Self-pay

## 2017-01-01 ENCOUNTER — Ambulatory Visit (HOSPITAL_COMMUNITY): Payer: Medicare Other

## 2017-01-04 ENCOUNTER — Ambulatory Visit (HOSPITAL_COMMUNITY): Payer: Medicare Other

## 2017-01-04 ENCOUNTER — Encounter: Payer: Self-pay | Admitting: Internal Medicine

## 2017-01-04 ENCOUNTER — Ambulatory Visit (INDEPENDENT_AMBULATORY_CARE_PROVIDER_SITE_OTHER): Payer: Medicare Other | Admitting: Internal Medicine

## 2017-01-04 ENCOUNTER — Other Ambulatory Visit: Payer: Self-pay | Admitting: Internal Medicine

## 2017-01-04 DIAGNOSIS — R05 Cough: Secondary | ICD-10-CM | POA: Diagnosis not present

## 2017-01-04 DIAGNOSIS — M79604 Pain in right leg: Secondary | ICD-10-CM | POA: Diagnosis not present

## 2017-01-04 DIAGNOSIS — J449 Chronic obstructive pulmonary disease, unspecified: Secondary | ICD-10-CM | POA: Diagnosis not present

## 2017-01-04 DIAGNOSIS — Z951 Presence of aortocoronary bypass graft: Secondary | ICD-10-CM | POA: Diagnosis not present

## 2017-01-04 DIAGNOSIS — J3489 Other specified disorders of nose and nasal sinuses: Secondary | ICD-10-CM

## 2017-01-04 DIAGNOSIS — F17211 Nicotine dependence, cigarettes, in remission: Secondary | ICD-10-CM | POA: Diagnosis not present

## 2017-01-04 DIAGNOSIS — E559 Vitamin D deficiency, unspecified: Secondary | ICD-10-CM

## 2017-01-04 DIAGNOSIS — M79605 Pain in left leg: Secondary | ICD-10-CM

## 2017-01-04 DIAGNOSIS — Z9981 Dependence on supplemental oxygen: Secondary | ICD-10-CM | POA: Diagnosis not present

## 2017-01-04 DIAGNOSIS — J019 Acute sinusitis, unspecified: Secondary | ICD-10-CM

## 2017-01-04 DIAGNOSIS — I2510 Atherosclerotic heart disease of native coronary artery without angina pectoris: Secondary | ICD-10-CM | POA: Diagnosis not present

## 2017-01-04 MED ORDER — BENZONATATE 100 MG PO CAPS: 100.0000 mg | ORAL_CAPSULE | Freq: Three times a day (TID) | ORAL | 0 refills | Status: DC | PRN

## 2017-01-04 MED ORDER — VITAMIN D (ERGOCALCIFEROL) 1.25 MG (50000 UNIT) PO CAPS: 50000.0000 [IU] | ORAL_CAPSULE | ORAL | 0 refills | Status: DC

## 2017-01-04 NOTE — Assessment & Plan Note (Signed)
I believe these symptoms and exam are most consistent with early rhinosinusitis. Will continue conservative management for now as these symptoms have been ongoing for one week. Have instructed pt to continue close monitoring and return to clinic if congestion is worsening or has not improved in 2 weeks, or if she develops worsening of her baseline shortness of breath. Encouraged continuous use of supplemental oxygen.  -start guanfacine and loratidine -rx for tessalon perles

## 2017-01-04 NOTE — Progress Notes (Signed)
Case discussed with Dr. Hetty Ely at the time of the visit. We reviewed the resident's history and exam and pertinent patient test results. I agree with the assessment, diagnosis, and plan of care documented in the resident's note.

## 2017-01-04 NOTE — Patient Instructions (Addendum)
It was a pleasure to see you today Elizabeth [name]  For your cold,  Start taking mucinex or any medication that has guanfacine  Use tessalon perles for your cough  Call the clinic if you have worsening of your shortness of breath or if your symptoms worsen or haven't improved in the next two weeks   For your leg pain,  You have a low vitamin D that I think could be related, take this supplementation once weekly for the next 6 weeks   Schedule a follow up appointment to be seen in the clinic in 1 month

## 2017-01-04 NOTE — Progress Notes (Signed)
CC: sinus congestion and cough   HPI: [name] is a 61 y.o. with past medical history of COPD (on 2 L of home oxygen 24/7), CAD (s/p CABG x4 7/17), HLD, HTN, and chronic diastolic heart failure. She presents to acute care clinic with complaints of sinus congestion and productive cough which began one week ago. She has had purulent yellow nasal sputum and cough productive of clear phlegm. Her cough has been the most bothersome symptoms, she felt that it was relieved when she took tessalon perls. She has not had sinus pressure, sore throat, fever or worsening of her baseline dyspnea on exertion or wheeze. She had symptoms similar to this when she was seen in the office on 1/23, this resolved with a course of antibiotics.   Of note, when she inittially arrived to the clinic she was not on supplemental oxygen and had SpO2 78%. She has only had access to large oxygen tanks up until this time and uses the supplementation while at home but is resistant to taking the tanks with her when she leaves the house. She is having arrangements now to have a portable oxygen tank delivered to her home but they are in low supply. SpO2 improved to 94% on 2 L.   Please see problem list for status of the pt's chronic medical problems.  Past Medical History:  Diagnosis Date  . Asthma   . CAD (coronary artery disease)    NSTEMI 08/2011:  LHC 08/21/11: mLAD 60-70%, pCFX occluded, dRCA chronic occlusion with L-R collats, EF 40-45%, inf AK.  PCI:  BMS to CFX.  Marland Kitchen COPD (chronic obstructive pulmonary disease) ([place]) 11/17/2016  . Depression   . Fibroids   . Fibromyalgia   . GERD (gastroesophageal reflux disease)   . Gout   . History of microcytic hypochromic anemia   . HLD (hyperlipidemia)    Chol = 235, LDL = 156 (08/2010)  . Hypertension   . Insomnia   . Lupus 2009   ANA + 10/2008, repeat ANA + (05/2009), on that visit 05/2009 following labs obtained RF <20, CRP <0.4, ANA titers 1:80,   . Myocardial  infarction 1990s  . On home oxygen therapy    "2L; 24/7 as of today" (11/24/2016)  . Pulmonary nodules 05/2008   noted on CXR and CT 05/2008, repeat CT 10/2008 - Stable small bilateral pulmonary nodules measuring up to 6 m m  . Stroke [facility]) 04/2014   "mini stroke"  . Substance abuse    cocaine     Review of Systems:  Please see each problem below for a pertinent review of systems.  Physical Exam:  Vitals:   01/04/17 0830  BP: 137/74  Pulse: 92  Temp: 97.6 F (36.4 C)  TempSrc: Oral  SpO2: (!) 83%  Weight: 215 lb 3.2 oz (97.6 kg)  Height: _0  (1.575 m)   Physical Exam  HENT:  Head: Normocephalic and atraumatic.  Mouth/Throat: Oropharynx is clear and moist. No oropharyngeal exudate.  No sinus maxillary or frontal sinus tenderness   Eyes: Conjunctivae are normal. No scleral icterus.  Pulmonary/Chest: Breath sounds normal. No respiratory distress. She has no wheezes. She has no rales.  Using nasal cannula   Lymphadenopathy:    She has no cervical adenopathy.  Neurological: She is alert.  Psychiatric: She has a normal mood and affect. Her behavior is normal.   Assessment & Plan:   See Encounters Tab for problem based charting.  Acute rhinosinusitis  I believe these  symptoms and exam are most consistent with early rhinosinusitis. Will continue conservative management for now as these symptoms have been ongoing for one week. Have instructed pt to continue close monitoring and return to clinic if congestion is worsening or has not improved in 2 weeks, or if she develops worsening of her baseline shortness of breath. Encouraged continuous use of supplemental oxygen.  -start guanfacine and loratidine -rx for tessalon perles   Vitamin D deficiency  Describes longstanding history of leg pain over her anterior thighs and lower legs. Upon chart review I found that she had a low vitamin D level when tested last month and dating back to 05/2015 which has not been treated.  - rx for  ergocalciferol 50,000 once weekly for the next 6-8 weeks - RTC for recheck of vit D level in one month, will need to reassess her supplementation regiment and evaluate any relief in her pain at that point    Patient discussed with Dr. Eppie Gibson

## 2017-01-04 NOTE — Assessment & Plan Note (Signed)
Describes longstanding history of leg pain over her anterior thighs and lower legs. Upon chart review I found that she had a low vitamin D level when tested last month and dating back to 05/2015 which has not been treated.  - rx for ergocalciferol 50,000 once weekly for the next 6-8 weeks - RTC for recheck of vit D level in one month, will need to reassess her supplementation regiment and evaluate any relief in her pain at that point

## 2017-01-06 ENCOUNTER — Ambulatory Visit (HOSPITAL_COMMUNITY): Payer: Medicare Other

## 2017-01-08 ENCOUNTER — Ambulatory Visit (HOSPITAL_COMMUNITY): Payer: Medicare Other

## 2017-01-11 ENCOUNTER — Ambulatory Visit (HOSPITAL_COMMUNITY): Payer: Medicare Other

## 2017-01-13 ENCOUNTER — Ambulatory Visit (HOSPITAL_COMMUNITY): Payer: Medicare Other

## 2017-01-15 ENCOUNTER — Ambulatory Visit (HOSPITAL_COMMUNITY): Payer: Medicare Other

## 2017-01-16 DIAGNOSIS — R911 Solitary pulmonary nodule: Secondary | ICD-10-CM | POA: Diagnosis not present

## 2017-01-16 DIAGNOSIS — J449 Chronic obstructive pulmonary disease, unspecified: Secondary | ICD-10-CM | POA: Diagnosis not present

## 2017-01-18 ENCOUNTER — Ambulatory Visit (HOSPITAL_COMMUNITY): Payer: Medicare Other

## 2017-01-20 ENCOUNTER — Ambulatory Visit (HOSPITAL_COMMUNITY): Payer: Medicare Other

## 2017-01-22 ENCOUNTER — Ambulatory Visit (HOSPITAL_COMMUNITY): Payer: Medicare Other

## 2017-01-25 ENCOUNTER — Ambulatory Visit: Payer: Medicare Other | Admitting: Pulmonary Disease

## 2017-01-25 ENCOUNTER — Ambulatory Visit (HOSPITAL_COMMUNITY): Payer: Medicare Other

## 2017-01-27 ENCOUNTER — Ambulatory Visit (HOSPITAL_COMMUNITY): Payer: Medicare Other

## 2017-01-29 ENCOUNTER — Ambulatory Visit (HOSPITAL_COMMUNITY): Payer: Medicare Other

## 2017-02-01 ENCOUNTER — Ambulatory Visit (HOSPITAL_COMMUNITY): Payer: Medicare Other

## 2017-02-01 ENCOUNTER — Other Ambulatory Visit: Payer: Self-pay | Admitting: Internal Medicine

## 2017-02-01 DIAGNOSIS — I5032 Chronic diastolic (congestive) heart failure: Secondary | ICD-10-CM

## 2017-02-01 DIAGNOSIS — I10 Essential (primary) hypertension: Secondary | ICD-10-CM

## 2017-02-02 NOTE — Telephone Encounter (Signed)
Received refill request from pt's pharmayc for lasix 35m one tab daily.  Pt already has refills on file for lasix 253mtake two tabs twice daily.  Pt was contacted-she is ok with taking the 2010mabs.  Will deny rx for 46m74mbs at this time. Pharmacy instructed to fill rx for the 20mg47ms. Phone call complete.GoldsDespina Hiddenady3/20/201811:56 AM

## 2017-02-03 ENCOUNTER — Ambulatory Visit (HOSPITAL_COMMUNITY): Payer: Medicare Other

## 2017-02-05 ENCOUNTER — Ambulatory Visit (HOSPITAL_COMMUNITY): Payer: Medicare Other

## 2017-02-08 ENCOUNTER — Ambulatory Visit (HOSPITAL_COMMUNITY): Payer: Medicare Other

## 2017-02-09 ENCOUNTER — Ambulatory Visit: Payer: Medicare Other

## 2017-02-10 ENCOUNTER — Ambulatory Visit: Payer: Medicare Other | Admitting: Pulmonary Disease

## 2017-02-10 ENCOUNTER — Ambulatory Visit (HOSPITAL_COMMUNITY): Payer: Medicare Other

## 2017-02-11 ENCOUNTER — Ambulatory Visit: Payer: Medicare Other | Admitting: Pulmonary Disease

## 2017-02-12 ENCOUNTER — Ambulatory Visit (HOSPITAL_COMMUNITY): Payer: Medicare Other

## 2017-02-15 ENCOUNTER — Ambulatory Visit (HOSPITAL_COMMUNITY): Payer: Medicare Other

## 2017-02-16 ENCOUNTER — Ambulatory Visit: Payer: Medicare Other

## 2017-02-16 DIAGNOSIS — J449 Chronic obstructive pulmonary disease, unspecified: Secondary | ICD-10-CM | POA: Diagnosis not present

## 2017-02-16 DIAGNOSIS — R911 Solitary pulmonary nodule: Secondary | ICD-10-CM | POA: Diagnosis not present

## 2017-02-17 ENCOUNTER — Ambulatory Visit (HOSPITAL_COMMUNITY): Payer: Medicare Other

## 2017-02-18 ENCOUNTER — Other Ambulatory Visit: Payer: Self-pay | Admitting: Internal Medicine

## 2017-02-18 DIAGNOSIS — I10 Essential (primary) hypertension: Secondary | ICD-10-CM

## 2017-02-18 DIAGNOSIS — I5032 Chronic diastolic (congestive) heart failure: Secondary | ICD-10-CM

## 2017-02-26 DIAGNOSIS — H401132 Primary open-angle glaucoma, bilateral, moderate stage: Secondary | ICD-10-CM | POA: Diagnosis not present

## 2017-03-04 ENCOUNTER — Ambulatory Visit (INDEPENDENT_AMBULATORY_CARE_PROVIDER_SITE_OTHER): Payer: Medicare Other | Admitting: Pulmonary Disease

## 2017-03-04 ENCOUNTER — Encounter: Payer: Self-pay | Admitting: Pulmonary Disease

## 2017-03-04 VITALS — BP 136/74 | HR 64 | Ht 62.0 in | Wt 215.4 lb

## 2017-03-04 DIAGNOSIS — J8410 Pulmonary fibrosis, unspecified: Secondary | ICD-10-CM | POA: Diagnosis not present

## 2017-03-04 DIAGNOSIS — J449 Chronic obstructive pulmonary disease, unspecified: Secondary | ICD-10-CM

## 2017-03-04 MED ORDER — FLUTICASONE-UMECLIDIN-VILANT 100-62.5-25 MCG/INH IN AEPB: 1.0000 | INHALATION_SPRAY | Freq: Every day | RESPIRATORY_TRACT | 5 refills | Status: DC

## 2017-03-04 MED ORDER — FLUTICASONE-UMECLIDIN-VILANT 100-62.5-25 MCG/INH IN AEPB: 1.0000 | INHALATION_SPRAY | Freq: Every day | RESPIRATORY_TRACT | 0 refills | Status: AC

## 2017-03-04 NOTE — Progress Notes (Signed)
Patient ID: Elizabeth [name], female   DOB: 04-Apr-1956, 61 y.o.   MRN: [id number] Patient seen in the office today and instructed on use of trelegy ellipta.  Patient expressed understanding and demonstrated technique.

## 2017-03-04 NOTE — Progress Notes (Signed)
Elizabeth [name]    [id number]    April 29, 1956  Primary Care Physician:Nina Hetty Ely, MD  Referring Physician: Ledell Noss, MD [address] [place], [place] [address]  Chief complaint:  Consult for evaluation of COPD GOLD B (CAT score 31, 1 exacerbation over the past year, FEV1 32%)  HPI: 61 year old with past medical history of lupus, heart failure, coronary artery disease s/p CABG in July 2017 paroxysmal A. fib, COPD, hypertension. She complaints of ongoing dyspnea on exertion since the CABG surgery. She was sent home at that time on home oxygen which she weaned herself off. However she has worsening of her hypoxia and was restarted on 2 L 24/7 home oxygen in January She was admitted in early January for acute on chronic respiratory failure secondary to flu a infection and heart failure.  In the office today she complains of chronic dyspnea on exertion. She denies any cough, sputum production, wheezing has a heavy smoking history and quit in early January after her hospitalization. As per records she is on Incruz and albuterol but she claims that she is on a third inhaler the name of which she cannot recall.  Interim History: She is not on any inhalers at present. She is not using her oxygen on a consistent basis. She arrived in the clinic off oxygen and sats in 69%. We will put her back on oxygen with improvement in O2 sats. She still has severe dyspnea on exertion. Denies any cough, sputum production, fevers, chills.  Outpatient Encounter Prescriptions as of 03/04/2017  Medication Sig  . albuterol (PROVENTIL HFA;VENTOLIN HFA) 108 (90 Base) MCG/ACT inhaler Inhale 2 puffs into the lungs every 6 (six) hours as needed for wheezing or shortness of breath.  Marland Kitchen amLODipine (NORVASC) 5 MG tablet TAKE 1 TABLET BY MOUTH EVERY DAY  . apixaban (ELIQUIS) 5 MG TABS tablet Take 5 mg by mouth daily.   Marland Kitchen atorvastatin (LIPITOR) 80 MG tablet Take 80 mg by mouth daily.  . benzonatate (TESSALON PERLES)  100 MG capsule Take 1 capsule (100 mg total) by mouth 3 (three) times daily as needed for cough.  . carvedilol (COREG) 12.5 MG tablet TAKE 1 TABLET(12.5 MG) BY MOUTH TWICE DAILY  . furosemide (LASIX) 20 MG tablet Take 2 tablets (40 mg total) by mouth 2 (two) times daily.  . naproxen (NAPROSYN) 500 MG tablet TAKE 1 TABLET BY MOUTH AT BEDTIME  . pantoprazole (PROTONIX) 40 MG tablet TAKE 1 TABLET(40 MG) BY MOUTH DAILY  . potassium chloride SA (K-DUR,KLOR-CON) 20 MEQ tablet Take 1 tablet (20 mEq total) by mouth daily.  Marland Kitchen umeclidinium bromide (INCRUSE ELLIPTA) 62.5 MCG/INH AEPB Inhale 1 puff into the lungs daily.  . valsartan (DIOVAN) 320 MG tablet Take 320 mg by mouth daily.  . Vitamin D, Ergocalciferol, (DRISDOL) 50000 units CAPS capsule Take 1 capsule (50,000 Units total) by mouth every 7 (seven) days.   No facility-administered encounter medications on file as of 03/04/2017.     Allergies as of 03/04/2017  . (No Known Allergies)    Past Medical History:  Diagnosis Date  . CAD (coronary artery disease)    NSTEMI 08/2011:  LHC 08/21/11: mLAD 60-70%, pCFX occluded, dRCA chronic occlusion with L-R collats, EF 40-45%, inf AK.  PCI:  BMS to CFX.  Marland Kitchen COPD (chronic obstructive pulmonary disease) ([place]) 11/17/2016  . Depression   . Fibroids   . Fibromyalgia   . GERD (gastroesophageal reflux disease)   . Gout   .  History of microcytic hypochromic anemia   . HLD (hyperlipidemia)    Chol = 235, LDL = 156 (08/2010)  . Hypertension   . Insomnia   . Lupus 2009   ANA + 10/2008, repeat ANA + (05/2009), on that visit 05/2009 following labs obtained RF <20, CRP <0.4, ANA titers 1:80,   . Myocardial infarction ([place]) 1990s  . On home oxygen therapy    "2L; 24/7 as of today" (11/24/2016)  . Pulmonary nodules 05/2008   noted on CXR and CT 05/2008, repeat CT 10/2008 - Stable small bilateral pulmonary nodules measuring up to 6 m m  . Stroke [facility]) 04/2014   "mini stroke"  . Substance abuse    cocaine      Past Surgical History:  Procedure Laterality Date  . CARDIAC CATHETERIZATION N/A 06/02/2016   Procedure: Left Heart Cath and Coronary Angiography;  Surgeon: Belva Crome, MD;  Location: [facility];  Service: Cardiovascular;  Laterality: N/A;  . COLONOSCOPY N/A 07/14/2013   Procedure: COLONOSCOPY;  Surgeon: Beryle Beams, MD;  Location: [facility];  Service: Endoscopy;  Laterality: N/A;  . CORONARY ANGIOPLASTY WITH STENT PLACEMENT    . CORONARY ARTERY BYPASS GRAFT N/A 06/08/2016   Procedure: CORONARY ARTERY BYPASS GRAFTING (CABG) X 4  With Right Greater Saphenous vein endoscopic harvesting;  Surgeon: [name], MD;  Location: [facility];  Service: Open Heart Surgery;  Laterality: N/A;  . FRACTURE SURGERY    . ORIF TIBIA PLATEAU Left 08/22/2013   Procedure: OPEN REDUCTION INTERNAL FIXATION (ORIF) TIBIAL PLATEAU;  Surgeon: Renette Butters, MD;  Location: [facility];  Service: Orthopedics;  Laterality: Left;  . TEE WITHOUT CARDIOVERSION N/A 06/08/2016   Procedure: TRANSESOPHAGEAL ECHOCARDIOGRAM (TEE);  Surgeon: [name], MD;  Location: [facility];  Service: Open Heart Surgery;  Laterality: N/A;  . TUBAL LIGATION    . VAGINAL HYSTERECTOMY  05/2003    Family History  Problem Relation Age of Onset  . Heart disease Mother   . Cervical cancer Sister   . Prostate cancer Brother   . Colon cancer Neg Hx     Social History   Social History  . Marital status: Divorced    Spouse name: N/A  . Number of children: N/A  . Years of education: N/A   Occupational History  . Not on file.   Social History Main Topics  . Smoking status: Former Smoker    Packs/day: 0.12    Years: 45.00    Types: Cigarettes    Quit date: 11/16/2016  . Smokeless tobacco: Never Used  . Alcohol use 18.0 oz/week    7 Standard drinks or equivalent, 23 Cans of beer per week     Comment: 11/24/2016 "beer 1-2  32-40oz beers/day per day"  . Drug use: Yes    Types: Cocaine     Comment: last used cocaine less  than a month ago  . Sexual activity: Not Currently   Other Topics Concern  . Not on file   Social History Narrative  . No narrative on file   Review of systems: Review of Systems  Constitutional: Negative for fever and chills.  HENT: Negative.   Eyes: Negative for blurred vision.  Respiratory: as per HPI  Cardiovascular: Negative for chest pain and palpitations.  Gastrointestinal: Negative for vomiting, diarrhea, blood per rectum. Genitourinary: Negative for dysuria, urgency, frequency and hematuria.  Musculoskeletal: Negative for myalgias, back pain and joint pain.  Skin: Negative for itching and rash.  Neurological: Negative for dizziness, tremors, focal weakness, seizures and loss of consciousness.  Endo/Heme/Allergies: Negative for environmental allergies.  Psychiatric/Behavioral: Negative for depression, suicidal ideas and hallucinations.  All other systems reviewed and are negative.  Physical Exam: Blood pressure 136/74, pulse 64, height _0  (1.575 m), weight 215 lb 6.4 oz (97.7 kg), last menstrual period 12/30/1994, SpO2 93 %. Gen:      No acute distress HEENT:  EOMI, sclera anicteric Neck:     No masses; no thyromegaly Lungs:    Clear to auscultation bilaterally; normal respiratory effort CV:         Regular rate and rhythm; no murmurs Abd:      + bowel sounds; soft, non-tender; no palpable masses, no distension Ext:    No edema; adequate peripheral perfusion Skin:      Warm and dry; no rash Neuro: alert and oriented x 3 Psych: normal mood and affect  Data Reviewed: PFTs 06/04/16 FVC 1.89 (76%) FEV1 1.36 (69%) F/F 72 TLC 78% DLCO 44% Moderate obstruction, minimal restriction, severe diffusion defect  CT scan 11/24/11-stable subcentimeter pulmonary nodules compared to 2009 Chest x-ray 11/26/16-no pneumonia, effusion. Probable mild chronic fibrotic changes All images reviewed.  Assessment:  COPD GOLD B  She was hypoxic off oxygen in office today which  recovered with O2. I reiterated to her that she needs to be on oxygen all the time. We will see if she can get a portable concentrator.  Start trelegy inhaler, give samples. Continue albuterol PRN.   H/O Lupus Chest x-ray shows fibrotic changes at the bases. She has history of lupus but appears to have been confined to the skin previously. We will evaluate for lung involvement with a high-resolution CT of the chest.  Former smoker Has stopped smoking since January 2018. I congratulated her and encouraged her to stay off cigarettes  Plan/Recommendations: - Continue O2 24/7. Try to get a portable concentrator  - Start trelegy - Smoking cessation. - CT chest  Return in 1-2 month  Marshell Garfinkel MD [facility] Pager [phone] 03/04/2017, 4:01 PM  CC: Ledell Noss, MD

## 2017-03-04 NOTE — Addendum Note (Signed)
Addended by: Maryanna Shape A on: 03/04/2017 05:22 PM   Modules accepted: Orders

## 2017-03-04 NOTE — Patient Instructions (Signed)
We will get a high-resolution CT of the chest to evaluate for lung fibrosis We'll give samples of Trelegy inhaler. Continue albuterol when necessary Continue oxygen 24/7.  Return in 1-2 months.

## 2017-03-04 NOTE — Addendum Note (Signed)
Addended by: Maryanna Shape A on: 03/04/2017 04:21 PM   Modules accepted: Orders

## 2017-03-05 ENCOUNTER — Telehealth: Payer: Self-pay | Admitting: Pulmonary Disease

## 2017-03-05 NOTE — Telephone Encounter (Signed)
PM please advise on rx for claustrophobia for upcoming CT.  Thanks.

## 2017-03-05 NOTE — Telephone Encounter (Signed)
Give xanax 0.25 mg once. Take 1/2 hr before study.

## 2017-03-05 NOTE — Telephone Encounter (Signed)
Need pharm name- LMTCB

## 2017-03-08 ENCOUNTER — Telehealth: Payer: Self-pay | Admitting: Cardiovascular Disease

## 2017-03-08 MED ORDER — ALPRAZOLAM 0.25 MG PO TABS: ORAL_TABLET | ORAL | 0 refills | Status: DC

## 2017-03-08 NOTE — Telephone Encounter (Signed)
lmtcb x2 to verify pharmacy.

## 2017-03-08 NOTE — Telephone Encounter (Signed)
Pt c/o of Chest Pain: 1. Are you having CP right now? no 2. Are you experiencing any other symptoms (ex. SOB, nausea, vomiting, sweating)? no 3. How long have you been experiencing CP? A few days 3-4 days  4. Is your CP continuous or coming and going? Coming and going  5. Have you taken Nitroglycerin? No

## 2017-03-08 NOTE — Telephone Encounter (Signed)
Pt returning call.Elizabeth [name]'

## 2017-03-08 NOTE — Telephone Encounter (Signed)
Spoke with pt and made her aware of PM's message. Pt understood and verified which pharmacy this needed to be called into. Called the pharmacy and called rx in. Pt had no further questions. Nothing further is needed

## 2017-03-08 NOTE — Telephone Encounter (Signed)
Phone call received from the patient. She stated that she has been experiencing on and off chest pain for 3-4 days. She was not currently having chest pain. She stated that she thinks it may be muscular. She requested an appointment for tomorrow which has been made for 9:40 with Dr. Sallyanne Kuster. She stated that she would go to the ED if her symptoms continued to get worse.

## 2017-03-08 NOTE — Telephone Encounter (Signed)
Pt returning call, again.Elizabeth [name]

## 2017-03-08 NOTE — Telephone Encounter (Signed)
lmtcb for pt.  

## 2017-03-09 ENCOUNTER — Encounter: Payer: Self-pay | Admitting: Cardiovascular Disease

## 2017-03-09 ENCOUNTER — Ambulatory Visit (INDEPENDENT_AMBULATORY_CARE_PROVIDER_SITE_OTHER): Payer: Medicare Other | Admitting: Cardiovascular Disease

## 2017-03-09 VITALS — BP 121/74 | HR 71 | Wt 219.0 lb

## 2017-03-09 DIAGNOSIS — I10 Essential (primary) hypertension: Secondary | ICD-10-CM | POA: Diagnosis not present

## 2017-03-09 DIAGNOSIS — R0602 Shortness of breath: Secondary | ICD-10-CM | POA: Diagnosis not present

## 2017-03-09 DIAGNOSIS — E7800 Pure hypercholesterolemia, unspecified: Secondary | ICD-10-CM | POA: Diagnosis not present

## 2017-03-09 DIAGNOSIS — I480 Paroxysmal atrial fibrillation: Secondary | ICD-10-CM

## 2017-03-09 DIAGNOSIS — J9621 Acute and chronic respiratory failure with hypoxia: Secondary | ICD-10-CM

## 2017-03-09 DIAGNOSIS — E785 Hyperlipidemia, unspecified: Secondary | ICD-10-CM | POA: Insufficient documentation

## 2017-03-09 DIAGNOSIS — I5042 Chronic combined systolic (congestive) and diastolic (congestive) heart failure: Secondary | ICD-10-CM

## 2017-03-09 DIAGNOSIS — Z7901 Long term (current) use of anticoagulants: Secondary | ICD-10-CM

## 2017-03-09 DIAGNOSIS — I2510 Atherosclerotic heart disease of native coronary artery without angina pectoris: Secondary | ICD-10-CM

## 2017-03-09 LAB — BASIC METABOLIC PANEL
BUN: 10 mg/dL (ref 7–25)
CHLORIDE: 105 mmol/L (ref 98–110)
CO2: 21 mmol/L (ref 20–31)
Calcium: 9.5 mg/dL (ref 8.6–10.4)
Creat: 0.74 mg/dL (ref 0.50–1.05)
Glucose, Bld: 81 mg/dL (ref 65–99)
POTASSIUM: 4.1 mmol/L (ref 3.5–5.3)
SODIUM: 139 mmol/L (ref 135–146)

## 2017-03-09 LAB — LIPID PANEL
CHOL/HDL RATIO: 2.8 ratio (ref <5.0)
Cholesterol: 145 mg/dL (ref <200)
HDL: 52 mg/dL (ref >50)
LDL Cholesterol: 72 mg/dL (ref <100)
TRIGLYCERIDES: 104 mg/dL (ref <150)
VLDL: 21 mg/dL (ref <30)

## 2017-03-09 MED ORDER — FUROSEMIDE 40 MG PO TABS: ORAL_TABLET | ORAL | 3 refills | Status: DC

## 2017-03-09 NOTE — Progress Notes (Signed)
Cardiology Office Note    Date:  03/10/2017   ID:  Elizabeth, [name] 23-Dec-1955, MRN [id number]  PCP:  Ledell Noss, MD  Cardiologist:   Sanda Klein, MD   Chief Complaint  Patient presents with  . Follow-up    pt co DOE and weight gain, 5 pounds in 2 days and a sweet taste in her mouth     History of Present Illness:  Elizabeth [name] is a 61 y.o. female coronary artery disease now roughly 8 months after CABG with hyperlipidemia, hypertension, chronic diastolic heart failure, remote history of cocaine abuse, pulmonary nodules, lupus and TIA.  She is not smoking. She still has not fully recovered from her episode of "flu" in February. Still wearing oxygen. Using pursed lip breathing intermittently today. Scheduled for a high resolution CT of the lungs later this week.  Amiodarone, used for postoperative worsening of pre-existing atrial fibrillation was stopped in November 2017. She has previously born a diagnosis of "lupus". Pulmonary function tests performed before bypass surgery were mildly abnormal with DLCO 44% of predicted, FVC 1.89 (76%), FEV1 1.36 (69%).  She denies exertional dyspnea and has not had angina pectoris, palpitations or any focal neurological complaints. She has mild ankle edema, but denies orthopnea, PND, cough, hemoptysis or any serious bleeding.   She underwent a cardiac cath in 08/2011 in the setting of NSTEMI and cocaine use cath showedchronic total occlusion of the proximal right coronary artery with collateral flow, long 60%-70% stenosis in the mid LAD, left circumflex coronary artery  occluded proximally with a hang-up suggesting acute lesion which received a bare-metal stent. Myoview 09/2015 showed fixed defect involving the inferolateral and anterolateral myocardium consistent with old MI, LV systolic dysfunction (94%).  In July 2017 she presented with unstable angina and nuclear stress test showed the old lateral infarction and anterior ischemia  and the cath showed 70-80 percent mid LAD obstruction with high-grade stenoses in both diagonal arteries and 85-90 percent stenosis in the left circumflex and oblique marginal branch downstream of the old stent. EF 40-45%. LVEDP elevated at 22 mm Hg. On 06/08/2016 underwent four-vessel bypass surgery with Dr. Servando Snare: LIMA to LAD, sequential SVG to OM and distal circumflex and SVG to distal RCA. Postop had moderate anemia and recurrent atrial fibrillation requiring amiodarone. She was discharged on home oxygen but is gradually weaning off. She has been receiving home physical therapy. No significant carotid lesions by duplex ultrasound in July 2017.   Past Medical History:  Diagnosis Date  . CAD (coronary artery disease)    NSTEMI 08/2011:  LHC 08/21/11: mLAD 60-70%, pCFX occluded, dRCA chronic occlusion with L-R collats, EF 40-45%, inf AK.  PCI:  BMS to CFX.  Marland Kitchen COPD (chronic obstructive pulmonary disease) ([place]) 11/17/2016  . Depression   . Fibroids   . Fibromyalgia   . GERD (gastroesophageal reflux disease)   . Gout   . History of microcytic hypochromic anemia   . HLD (hyperlipidemia)    Chol = 235, LDL = 156 (08/2010)  . Hypertension   . Insomnia   . Lupus 2009   ANA + 10/2008, repeat ANA + (05/2009), on that visit 05/2009 following labs obtained RF <20, CRP <0.4, ANA titers 1:80,   . Myocardial infarction ([place]) 1990s  . On home oxygen therapy    "2L; 24/7 as of today" (11/24/2016)  . Pulmonary nodules 05/2008   noted on CXR and CT 05/2008, repeat CT 10/2008 - Stable small bilateral pulmonary nodules measuring up  to 6 m m  . Stroke [facility]) 04/2014   "mini stroke"  . Substance abuse    cocaine     Past Surgical History:  Procedure Laterality Date  . CARDIAC CATHETERIZATION N/A 06/02/2016   Procedure: Left Heart Cath and Coronary Angiography;  Surgeon: Belva Crome, MD;  Location: [facility];  Service: Cardiovascular;  Laterality: N/A;  . COLONOSCOPY N/A 07/14/2013   Procedure:  COLONOSCOPY;  Surgeon: Beryle Beams, MD;  Location: [facility];  Service: Endoscopy;  Laterality: N/A;  . CORONARY ANGIOPLASTY WITH STENT PLACEMENT    . CORONARY ARTERY BYPASS GRAFT N/A 06/08/2016   Procedure: CORONARY ARTERY BYPASS GRAFTING (CABG) X 4  With Right Greater Saphenous vein endoscopic harvesting;  Surgeon: [name], MD;  Location: [facility];  Service: Open Heart Surgery;  Laterality: N/A;  . FRACTURE SURGERY    . ORIF TIBIA PLATEAU Left 08/22/2013   Procedure: OPEN REDUCTION INTERNAL FIXATION (ORIF) TIBIAL PLATEAU;  Surgeon: Renette Butters, MD;  Location: [facility];  Service: Orthopedics;  Laterality: Left;  . TEE WITHOUT CARDIOVERSION N/A 06/08/2016   Procedure: TRANSESOPHAGEAL ECHOCARDIOGRAM (TEE);  Surgeon: [name], MD;  Location: [facility];  Service: Open Heart Surgery;  Laterality: N/A;  . TUBAL LIGATION    . VAGINAL HYSTERECTOMY  05/2003    Current Medications: Outpatient Medications Prior to Visit  Medication Sig Dispense Refill  . albuterol (PROVENTIL HFA;VENTOLIN HFA) 108 (90 Base) MCG/ACT inhaler Inhale 2 puffs into the lungs every 6 (six) hours as needed for wheezing or shortness of breath. 1 Inhaler 2  . amLODipine (NORVASC) 5 MG tablet TAKE 1 TABLET BY MOUTH EVERY DAY 30 tablet 11  . apixaban (ELIQUIS) 5 MG TABS tablet Take 5 mg by mouth daily.     Marland Kitchen atorvastatin (LIPITOR) 80 MG tablet Take 80 mg by mouth daily.  3  . carvedilol (COREG) 12.5 MG tablet TAKE 1 TABLET(12.5 MG) BY MOUTH TWICE DAILY 180 tablet 3  . Fluticasone-Umeclidin-Vilant (TRELEGY ELLIPTA) 100-62.5-25 MCG/INH AEPB Inhale 1 puff into the lungs daily. 1 each 5  . naproxen (NAPROSYN) 500 MG tablet TAKE 1 TABLET BY MOUTH AT BEDTIME 90 tablet 0  . pantoprazole (PROTONIX) 40 MG tablet TAKE 1 TABLET(40 MG) BY MOUTH DAILY 90 tablet 3  . potassium chloride SA (K-DUR,KLOR-CON) 20 MEQ tablet Take 1 tablet (20 mEq total) by mouth daily. 90 tablet 3  . umeclidinium bromide (INCRUSE ELLIPTA) 62.5  MCG/INH AEPB Inhale 1 puff into the lungs daily. 7 each 4  . valsartan (DIOVAN) 320 MG tablet Take 320 mg by mouth daily.  11  . Vitamin D, Ergocalciferol, (DRISDOL) 50000 units CAPS capsule Take 1 capsule (50,000 Units total) by mouth every 7 (seven) days. 8 capsule 0  . furosemide (LASIX) 20 MG tablet Take 2 tablets (40 mg total) by mouth 2 (two) times daily. 180 tablet 3  . ALPRAZolam (XANAX) 0.25 MG tablet Take tab 1/2 hr before scan (Patient not taking: Reported on 03/09/2017) 1 tablet 0  . benzonatate (TESSALON PERLES) 100 MG capsule Take 1 capsule (100 mg total) by mouth 3 (three) times daily as needed for cough. (Patient not taking: Reported on 03/09/2017) 30 capsule 0   No facility-administered medications prior to visit.      Allergies:   Patient has no known allergies.   Social History   Social History  . Marital status: Divorced    Spouse name: N/A  . Number of children: N/A  . Years of  education: N/A   Social History Main Topics  . Smoking status: Former Smoker    Packs/day: 0.12    Years: 45.00    Types: Cigarettes    Quit date: 11/16/2016  . Smokeless tobacco: Never Used  . Alcohol use 18.0 oz/week    7 Standard drinks or equivalent, 23 Cans of beer per week     Comment: 11/24/2016 "beer 1-2  32-40oz beers/day per day"  . Drug use: Yes    Types: Cocaine     Comment: last used cocaine less than a month ago  . Sexual activity: Not Currently   Other Topics Concern  . None   Social History Narrative  . None     Family History:  The patient's family history includes Cervical cancer in her sister; Heart disease in her mother; Prostate cancer in her brother.   ROS:   Please see the history of present illness.    ROS All other systems reviewed and are negative.   PHYSICAL EXAM:   VS:  BP 121/74 (BP Location: Right Arm, Patient Position: Sitting, Cuff Size: Normal)   Pulse 71   Wt 99.3 kg (219 lb)   LMP 12/30/1994   BMI 40.06 kg/m    GEN: Well nourished, well  developed, in no acute distress  HEENT: normal  Neck: no JVD, carotid bruits, or masses Cardiac: Sternotomy scar is healed with some keloid formation at the upper end, RRR; no murmurs, rubs, or gallops, minimal pedal edema  Respiratory:  clear to auscultation bilaterally, normal work of breathing GI: soft, nontender, nondistended, + BS MS: no deformity or atrophy  Skin: warm and dry, no rash Neuro:  Alert and Oriented x 3, Strength and sensation are intact Psych: euthymic mood, full affect  Wt Readings from Last 3 Encounters:  03/09/17 99.3 kg (219 lb)  03/04/17 97.7 kg (215 lb 6.4 oz)  01/04/17 97.6 kg (215 lb 3.2 oz)      Studies/Labs Reviewed:   EKG:  EKG is ordered today. It shows normal sinus rhythm with minor ST-T changes in the inferior leads as well as in V3-V6, QTC 482 ms  Recent Labs: 06/10/2016: TSH 2.779 11/24/2016: ALT 23 11/26/2016: Hemoglobin 12.0; Magnesium 1.9; Platelets 226 03/09/2017: Brain Natriuretic Peptide 500.0; BUN 10; Creat 0.74; Potassium 4.1; Sodium 139   Lipid Panel    Component Value Date/Time   CHOL 145 03/09/2017 1148   CHOL 236 (H) 08/19/2015 0941   TRIG 104 03/09/2017 1148   HDL 52 03/09/2017 1148   HDL 49 08/19/2015 0941   CHOLHDL 2.8 03/09/2017 1148   VLDL 21 03/09/2017 1148   LDLCALC 72 03/09/2017 1148   LDLCALC 159 (H) 08/19/2015 0941   LDLDIRECT 145.2 10/29/2011 1618    Additional studies/ records that were reviewed today include:  Notes from Pulmonary clinic   ASSESSMENT:    1. Chronic combined systolic and diastolic congestive heart failure (HCC)   2. Paroxysmal atrial fibrillation ([place])   3. Long term current use of anticoagulant   4. Coronary artery disease involving native coronary artery of native heart without angina pectoris   5. Pure hypercholesterolemia   6. Essential hypertension   7. Shortness of breath   8. Acute on chronic respiratory failure with hypoxia (HCC)      PLAN:  In order of problems listed  above:  1. CHF: Clinically probably euvolemic, minimal pedal swelling, BNP at baseline. Will slightly increase her daily diuretic dose, but I'm not convinced this will make any difference  in her dyspnea. Preoperatively left ventricular ejection fraction was depressed at around 45% and did not improve on the postoperative echo, she has lateral wall scar by previous nuclear stress testing. Wheezing is not a prominent complaint, but if her pulmonary specialist thinks it's important can switch to a more selective beta blocker 2. AFib: She had atrial fibrillation even before her bypass surgery and has a previous history of TIA. Amiodarone used for increased burden of postop atrial fibrillation, stopped in November. 3. Anticoagulation: she needs to remain on lifelong anticoagulation. CHADSVasc 5 (TIA 2, gender, hypertension, CAD). 4. CAD s/p CABG: Recovered well after bypass surgery. No angina pectoris.  5. HLP: On high-dose statin. Target LDL<70. 6. HTN:  Well controlled. 7. Hypoxia: Gas exchange abnormalities were documented even before her bypass surgery. Has a history of previous smoking and COPD as well as some fibrotic changes and nodules on radiological studies. She was on amiodarone roughly 3 months last year, but this medication was stopped 3 months before the onset of her hypoxia. Due to have a repeat CT later this week.  8. COPD: Congratulated on smoking cessation.     Medication Adjustments/Labs and Tests Ordered: Current medicines are reviewed at length with the patient today.  Concerns regarding medicines are outlined above.  Medication changes, Labs and Tests ordered today are listed in the Patient Instructions below. Patient Instructions  Dr Sallyanne Kuster has recommended making the following medication changes: 1. INCREASE Furosemide - take 80 mg (2 tablets) in the morning and take 40 mg (1 tablet) in the afternoon  Your physician recommends that you return for lab work at your earliest  [place].  Dr Sallyanne Kuster recommends that you schedule a follow-up appointment in 3 months.  If you need a refill on your cardiac medications before your next appointment, please call your pharmacy.    Signed, Sanda Klein, MD  03/10/2017 2:54 PM    [address], [place], [place]  [address] Phone: [phone]; Fax: [phone]

## 2017-03-09 NOTE — Patient Instructions (Signed)
Dr Sallyanne Kuster has recommended making the following medication changes: 1. INCREASE Furosemide - take 80 mg (2 tablets) in the morning and take 40 mg (1 tablet) in the afternoon  Your physician recommends that you return for lab work at your earliest [facility].  Dr Sallyanne Kuster recommends that you schedule a follow-up appointment in 3 months.  If you need a refill on your cardiac medications before your next appointment, please call your pharmacy.

## 2017-03-10 LAB — BRAIN NATRIURETIC PEPTIDE: Brain Natriuretic Peptide: 500 pg/mL — ABNORMAL HIGH (ref <100)

## 2017-03-11 ENCOUNTER — Ambulatory Visit (INDEPENDENT_AMBULATORY_CARE_PROVIDER_SITE_OTHER)
Admission: RE | Admit: 2017-03-11 | Discharge: 2017-03-11 | Disposition: A | Discharge status: Home / Self Care | Payer: Medicare Other | Source: Ambulatory Visit | Attending: Pulmonary Disease | Admitting: Pulmonary Disease

## 2017-03-11 ENCOUNTER — Telehealth: Payer: Self-pay | Admitting: Cardiovascular Disease

## 2017-03-11 DIAGNOSIS — J8410 Pulmonary fibrosis, unspecified: Secondary | ICD-10-CM | POA: Diagnosis not present

## 2017-03-11 DIAGNOSIS — R918 Other nonspecific abnormal finding of lung field: Secondary | ICD-10-CM | POA: Diagnosis not present

## 2017-03-11 NOTE — Telephone Encounter (Signed)
Returned call to patient-made aware of results and recommendations.    Notes recorded by Sanda Klein, MD on 03/10/2017 at 2:08 PM EDT Routine labs and cholesterol numbers look great. BNP is a little high, but similar to her previous recordings. On the whole I would interpret the tests as showing that her respiratory problems are primarily due to her lungs and not due to heart failure.  Having a procedure by pulmonologist today and will speak to them.     Patient verbalized understanding, no further questions or concerns at this time.

## 2017-03-11 NOTE — Telephone Encounter (Signed)
New message    Pt is calling back about lab results.

## 2017-03-12 ENCOUNTER — Telehealth: Payer: Self-pay | Admitting: Pulmonary Disease

## 2017-03-12 NOTE — Telephone Encounter (Signed)
Spoke with pt who just wanted to discuss the results of her CT again and just make sure she understood. Went over it again, at this time she had no further questions. Nothing further is needed    Notes recorded by Marshell Garfinkel, MD on 03/11/2017 at 10:27 PM EDT Please let the patient know that the CT does not show any lung scarring. There is emphysema and lung nodules that are likely benign There is evidence of mild pulmonary HTN and cardiac disease. Ask her to follow with her heart doctor. Thanks

## 2017-03-18 DIAGNOSIS — J449 Chronic obstructive pulmonary disease, unspecified: Secondary | ICD-10-CM | POA: Diagnosis not present

## 2017-03-18 DIAGNOSIS — R911 Solitary pulmonary nodule: Secondary | ICD-10-CM | POA: Diagnosis not present

## 2017-04-06 ENCOUNTER — Ambulatory Visit: Payer: Medicare Other | Admitting: Pulmonary Disease

## 2017-04-13 ENCOUNTER — Encounter (HOSPITAL_COMMUNITY): Payer: Self-pay | Admitting: Emergency Medicine

## 2017-04-13 ENCOUNTER — Ambulatory Visit (HOSPITAL_COMMUNITY)
Admission: EM | Admit: 2017-04-13 | Discharge: 2017-04-13 | Disposition: A | Discharge status: Home / Self Care | Payer: Medicare Other | Attending: Family Medicine | Admitting: Family Medicine

## 2017-04-13 DIAGNOSIS — Z7901 Long term (current) use of anticoagulants: Secondary | ICD-10-CM

## 2017-04-13 DIAGNOSIS — R58 Hemorrhage, not elsewhere classified: Secondary | ICD-10-CM

## 2017-04-13 DIAGNOSIS — S01511A Laceration without foreign body of lip, initial encounter: Secondary | ICD-10-CM

## 2017-04-13 MED ORDER — LIDOCAINE-EPINEPHRINE (PF) 2 %-1:200000 IJ SOLN
INTRAMUSCULAR | Status: AC
  Filled 2017-04-13: qty 20

## 2017-04-13 NOTE — ED Triage Notes (Signed)
Lower lip is bleeding.  Pulled what was thought to be a popcorn kernel off lower lip.  Lip has been bleeding ever since then.  Patient is on eliquis.

## 2017-04-13 NOTE — ED Provider Notes (Addendum)
CSN: [id number]     Arrival date & time 04/13/17  1012 History   First MD Initiated Contact with Patient 04/13/17 1103     Chief Complaint  Patient presents with  . Lip Laceration   (Consider location/radiation/quality/duration/timing/severity/associated sxs/prior Treatment) 61 year old female who is on the liquids status post MI states that last night she was eating popcorn and believes that she may have produced a small puncture wound to the mid lower lip. This is been bleeding ever since. She cannot get it stopped. She is here for hemostasis.      Past Medical History:  Diagnosis Date  . CAD (coronary artery disease)    NSTEMI 08/2011:  LHC 08/21/11: mLAD 60-70%, pCFX occluded, dRCA chronic occlusion with L-R collats, EF 40-45%, inf AK.  PCI:  BMS to CFX.  Marland Kitchen COPD (chronic obstructive pulmonary disease) ([place]) 11/17/2016  . Depression   . Fibroids   . Fibromyalgia   . GERD (gastroesophageal reflux disease)   . Gout   . History of microcytic hypochromic anemia   . HLD (hyperlipidemia)    Chol = 235, LDL = 156 (08/2010)  . Hypertension   . Insomnia   . Lupus 2009   ANA + 10/2008, repeat ANA + (05/2009), on that visit 05/2009 following labs obtained RF <20, CRP <0.4, ANA titers 1:80,   . Myocardial infarction ([place]) 1990s  . On home oxygen therapy    "2L; 24/7 as of today" (11/24/2016)  . Pulmonary nodules 05/2008   noted on CXR and CT 05/2008, repeat CT 10/2008 - Stable small bilateral pulmonary nodules measuring up to 6 m m  . Stroke [facility]) 04/2014   "mini stroke"  . Substance abuse    cocaine    Past Surgical History:  Procedure Laterality Date  . CARDIAC CATHETERIZATION N/A 06/02/2016   Procedure: Left Heart Cath and Coronary Angiography;  Surgeon: Belva Crome, MD;  Location: [facility];  Service: Cardiovascular;  Laterality: N/A;  . COLONOSCOPY N/A 07/14/2013   Procedure: COLONOSCOPY;  Surgeon: Beryle Beams, MD;  Location: [facility];  Service: Endoscopy;   Laterality: N/A;  . CORONARY ANGIOPLASTY WITH STENT PLACEMENT    . CORONARY ARTERY BYPASS GRAFT N/A 06/08/2016   Procedure: CORONARY ARTERY BYPASS GRAFTING (CABG) X 4  With Right Greater Saphenous vein endoscopic harvesting;  Surgeon: [name], MD;  Location: [facility];  Service: Open Heart Surgery;  Laterality: N/A;  . FRACTURE SURGERY    . ORIF TIBIA PLATEAU Left 08/22/2013   Procedure: OPEN REDUCTION INTERNAL FIXATION (ORIF) TIBIAL PLATEAU;  Surgeon: Renette Butters, MD;  Location: [facility];  Service: Orthopedics;  Laterality: Left;  . TEE WITHOUT CARDIOVERSION N/A 06/08/2016   Procedure: TRANSESOPHAGEAL ECHOCARDIOGRAM (TEE);  Surgeon: [name], MD;  Location: [facility];  Service: Open Heart Surgery;  Laterality: N/A;  . TUBAL LIGATION    . VAGINAL HYSTERECTOMY  05/2003   Family History  Problem Relation Age of Onset  . Heart disease Mother   . Cervical cancer Sister   . Prostate cancer Brother   . Colon cancer Neg Hx    Social History  Substance Use Topics  . Smoking status: Former Smoker    Packs/day: 0.12    Years: 45.00    Types: Cigarettes    Quit date: 11/16/2016  . Smokeless tobacco: Never Used  . Alcohol use 18.0 oz/week    7 Standard drinks or equivalent, 23 Cans of beer per week     Comment:  11/24/2016 "beer 1-2  32-40oz beers/day per day"   OB History    No data available     Review of Systems  Constitutional: Negative.   HENT:       As per history of present illness. Otherwise negative  Respiratory: Negative.   Cardiovascular: Negative.   Neurological: Negative.   All other systems reviewed and are negative.   Allergies  Patient has no known allergies.  Home Medications   Prior to Admission medications   Medication Sig Start Date End Date Taking? Authorizing Provider  albuterol (PROVENTIL HFA;VENTOLIN HFA) 108 (90 Base) MCG/ACT inhaler Inhale 2 puffs into the lungs every 6 (six) hours as needed for wheezing or shortness of breath. 12/08/16   Holley Raring, MD  amLODipine (NORVASC) 5 MG tablet TAKE 1 TABLET BY MOUTH EVERY DAY 02/02/17   Ledell Noss, MD  apixaban (ELIQUIS) 5 MG TABS tablet Take 5 mg by mouth daily.     [provider]  atorvastatin (LIPITOR) 80 MG tablet Take 80 mg by mouth daily. 12/10/16   [provider]  carvedilol (COREG) 12.5 MG tablet TAKE 1 TABLET(12.5 MG) BY MOUTH TWICE DAILY 03/07/16   Liberty Handy, MD  Fluticasone-Umeclidin-Vilant (TRELEGY ELLIPTA) 100-62.5-25 MCG/INH AEPB Inhale 1 puff into the lungs daily. 03/04/17   Mannam, Hart Robinsons, MD  furosemide (LASIX) 40 MG tablet Take 2 tablets (80 mg total) by mouth every morning and take 1 tablet (40 mg total) by mouth every evening. 03/09/17   Croitoru, Mihai, MD  naproxen (NAPROSYN) 500 MG tablet TAKE 1 TABLET BY MOUTH AT BEDTIME 12/08/16   Ledell Noss, MD  pantoprazole (PROTONIX) 40 MG tablet TAKE 1 TABLET(40 MG) BY MOUTH DAILY 12/12/16   Ledell Noss, MD  potassium chloride SA (K-DUR,KLOR-CON) 20 MEQ tablet Take 1 tablet (20 mEq total) by mouth daily. 07/27/16   [name], MD  umeclidinium bromide (INCRUSE ELLIPTA) 62.5 MCG/INH AEPB Inhale 1 puff into the lungs daily. 11/19/16   Ledell Noss, MD  valsartan (DIOVAN) 320 MG tablet Take 320 mg by mouth daily. 10/04/16   [provider]  Vitamin D, Ergocalciferol, (DRISDOL) 50000 units CAPS capsule Take 1 capsule (50,000 Units total) by mouth every 7 (seven) days. 01/04/17   Ledell Noss, MD   Meds Ordered and Administered this Visit  Medications - No data to display  BP 119/74 (BP Location: Left Arm)   Pulse 66   Temp 98.5 F (36.9 C) (Oral)   Resp (!) 22   LMP 12/30/1994   SpO2 94%  No data found.   Physical Exam  Constitutional: She appears well-developed and well-nourished. No distress.  HENT:  Head: Normocephalic and atraumatic.  The lip wound is quite superficial. It does continually bleed. Direct pressure for prolonged time does not stop the bleeding. No actual laceration is seen. It is  located to the mucosal aspect near the vermilion border the mid lip.  Eyes: EOM are normal.  Neck: Neck supple.  Pulmonary/Chest: Effort normal.  Skin: Skin is warm and dry.  Psychiatric: She has a normal mood and affect.  Nursing note and vitals reviewed.   Urgent Care Course     .Marland KitchenLaceration Repair Date/Time: 04/13/2017 11:43 AM Performed by: Marcha Dutton Inara Dike Authorized by: Robyn Haber   Consent:    Consent obtained:  Verbal   Consent given by:  Patient   Risks discussed:  Infection Anesthesia (see MAR for exact dosages):    Anesthesia method:  Local infiltration   Local anesthetic:  Lidocaine  2% WITH epi Laceration details:    Location:  Lip   Lip location:  Lower interior lip   Length (cm):  0.1   Depth (mm):  0.1 Repair type:    Repair type:  Simple Treatment:    Area cleansed with:  Betadine   Amount of cleaning:  Standard   Irrigation solution:  Sterile saline   Visualized foreign bodies/material removed: no   Skin repair:    Repair method:  Sutures   Suture size:  4-0   Suture material:  Chromic gut   Number of sutures:  1 Approximation:    Approximation:  Close   Vermilion border: well-aligned   Post-procedure details:    Dressing:  Open (no dressing) Comments:     X type suture over the small area of persistent bleeding for hemostasis.    (including critical care time)  Labs Review Labs Reviewed - No data to display  Imaging Review No results found.   Visual Acuity Review  Right Eye Distance:   Left Eye Distance:   Bilateral Distance:    Right Eye Near:   Left Eye Near:    Bilateral Near:         MDM   1. Hemorrhage   2. On continuous oral anticoagulation    Keep taking  your medications. When you get home apply some cold compresses with ice And pressure. This will help minimize swelling and any bleeding under the skin of the lip. If the bleeding returns or gets worse may return. Bleeding well controlled with X type suture and  lidocaine with epi. Patient stable and no bleeding on discharge.    Janne Napoleon, NP 04/13/17 [facility]    Janne Napoleon, NP 04/13/17 1202

## 2017-04-13 NOTE — Discharge Instructions (Signed)
Keep taking  your medications. When you get home apply some cold compresses with ice And pressure. This will help minimize swelling and any bleeding under the skin of the lip. If the bleeding returns or gets worse may return.

## 2017-04-15 ENCOUNTER — Ambulatory Visit: Payer: Medicare Other | Admitting: Pulmonary Disease

## 2017-04-18 DIAGNOSIS — R911 Solitary pulmonary nodule: Secondary | ICD-10-CM | POA: Diagnosis not present

## 2017-04-18 DIAGNOSIS — J449 Chronic obstructive pulmonary disease, unspecified: Secondary | ICD-10-CM | POA: Diagnosis not present

## 2017-04-19 ENCOUNTER — Ambulatory Visit (HOSPITAL_COMMUNITY)
Admission: EM | Admit: 2017-04-19 | Discharge: 2017-04-19 | Discharge status: Absent without leave | Payer: Medicare Other

## 2017-04-19 ENCOUNTER — Ambulatory Visit (INDEPENDENT_AMBULATORY_CARE_PROVIDER_SITE_OTHER): Payer: Medicare Other | Admitting: Pulmonary Disease

## 2017-04-19 ENCOUNTER — Encounter: Payer: Self-pay | Admitting: Pulmonary Disease

## 2017-04-19 VITALS — BP 128/78 | HR 72 | Ht 62.0 in | Wt 219.6 lb

## 2017-04-19 DIAGNOSIS — J449 Chronic obstructive pulmonary disease, unspecified: Secondary | ICD-10-CM

## 2017-04-19 MED ORDER — ALBUTEROL SULFATE HFA 108 (90 BASE) MCG/ACT IN AERS: 2.0000 | INHALATION_SPRAY | Freq: Four times a day (QID) | RESPIRATORY_TRACT | 2 refills | Status: DC | PRN

## 2017-04-19 NOTE — Patient Instructions (Addendum)
Continue using the trelegy inhaler and the supplemental O2 We will call in the proventil rescue inhaler We will make a referral to pulmonary rehab program  Return in 6 months

## 2017-04-19 NOTE — Progress Notes (Signed)
Elizabeth [name]    [id number]    March 03, 1956  Primary Care Physician:Blum, Gae Bon, MD  Referring Physician: Ledell Noss, MD [place], [place] [address]  Chief complaint:  Consult for evaluation of COPD GOLD B (CAT score 31, 1 exacerbation over the past year, FEV1 72%)  HPI: 61 year old with past medical history of lupus, heart failure, coronary artery disease s/p CABG in July 2017 paroxysmal A. fib, COPD, hypertension. She complaints of ongoing dyspnea on exertion since the CABG surgery. She was sent home at that time on home oxygen which she weaned herself off. However she has worsening of her hypoxia and was restarted on 2 L 24/7 home oxygen in January She was admitted in early January for acute on chronic respiratory failure secondary to flu a infection and heart failure.  In the office today she complains of chronic dyspnea on exertion. She denies any cough, sputum production, wheezing has a heavy smoking history and quit in early January after her hospitalization. As per records she is on Incruz and albuterol but she claims that she is on a third inhaler the name of which she cannot recall.  Interim History: She started on trelegy inhaler and supplemental oxygen at last visit. She reports slightly worse dyspnea on exertion, cough with white sputum. Occasional chest tightness and wheezing. She does not have any fevers, chills, hemoptysis.  Outpatient Encounter Prescriptions as of 04/19/2017  Medication Sig  . albuterol (PROVENTIL HFA;VENTOLIN HFA) 108 (90 Base) MCG/ACT inhaler Inhale 2 puffs into the lungs every 6 (six) hours as needed for wheezing or shortness of breath.  Marland Kitchen amLODipine (NORVASC) 5 MG tablet TAKE 1 TABLET BY MOUTH EVERY DAY  . apixaban (ELIQUIS) 5 MG TABS tablet Take 5 mg by mouth daily.   Marland Kitchen atorvastatin (LIPITOR) 80 MG tablet Take 80 mg by mouth daily.  . carvedilol (COREG) 12.5 MG tablet TAKE 1 TABLET(12.5 MG) BY MOUTH TWICE DAILY  .  Fluticasone-Umeclidin-Vilant (TRELEGY ELLIPTA) 100-62.5-25 MCG/INH AEPB Inhale 1 puff into the lungs daily.  . furosemide (LASIX) 40 MG tablet Take 2 tablets (80 mg total) by mouth every morning and take 1 tablet (40 mg total) by mouth every evening.  . naproxen (NAPROSYN) 500 MG tablet TAKE 1 TABLET BY MOUTH AT BEDTIME  . pantoprazole (PROTONIX) 40 MG tablet TAKE 1 TABLET(40 MG) BY MOUTH DAILY  . potassium chloride SA (K-DUR,KLOR-CON) 20 MEQ tablet Take 1 tablet (20 mEq total) by mouth daily.  . valsartan (DIOVAN) 320 MG tablet Take 320 mg by mouth daily.  . Vitamin D, Ergocalciferol, (DRISDOL) 50000 units CAPS capsule Take 1 capsule (50,000 Units total) by mouth every 7 (seven) days.  . [DISCONTINUED] umeclidinium bromide (INCRUSE ELLIPTA) 62.5 MCG/INH AEPB Inhale 1 puff into the lungs daily.   No facility-administered encounter medications on file as of 04/19/2017.     Allergies as of 04/19/2017  . (No Known Allergies)    Past Medical History:  Diagnosis Date  . CAD (coronary artery disease)    NSTEMI 08/2011:  LHC 08/21/11: mLAD 60-70%, pCFX occluded, dRCA chronic occlusion with L-R collats, EF 40-45%, inf AK.  PCI:  BMS to CFX.  Marland Kitchen COPD (chronic obstructive pulmonary disease) ([place]) 11/17/2016  . Depression   . Fibroids   . Fibromyalgia   . GERD (gastroesophageal reflux disease)   . Gout   . History of microcytic hypochromic anemia   . HLD (hyperlipidemia)    Chol = 235, LDL =  156 (08/2010)  . Hypertension   . Insomnia   . Lupus 2009   ANA + 10/2008, repeat ANA + (05/2009), on that visit 05/2009 following labs obtained RF <20, CRP <0.4, ANA titers 1:80,   . Myocardial infarction ([place]) 1990s  . On home oxygen therapy    "2L; 24/7 as of today" (11/24/2016)  . Pulmonary nodules 05/2008   noted on CXR and CT 05/2008, repeat CT 10/2008 - Stable small bilateral pulmonary nodules measuring up to 6 m m  . Stroke [facility]) 04/2014   "mini stroke"  . Substance abuse    cocaine     Past  Surgical History:  Procedure Laterality Date  . CARDIAC CATHETERIZATION N/A 06/02/2016   Procedure: Left Heart Cath and Coronary Angiography;  Surgeon: Belva Crome, MD;  Location: [place] [facility];  Service: Cardiovascular;  Laterality: N/A;  . COLONOSCOPY N/A 07/14/2013   Procedure: COLONOSCOPY;  Surgeon: Beryle Beams, MD;  Location: [facility];  Service: Endoscopy;  Laterality: N/A;  . CORONARY ANGIOPLASTY WITH STENT PLACEMENT    . CORONARY ARTERY BYPASS GRAFT N/A 06/08/2016   Procedure: CORONARY ARTERY BYPASS GRAFTING (CABG) X 4  With Right Greater Saphenous vein endoscopic harvesting;  Surgeon: [name], MD;  Location: [place];  Service: Open Heart Surgery;  Laterality: N/A;  . FRACTURE SURGERY    . ORIF TIBIA PLATEAU Left 08/22/2013   Procedure: OPEN REDUCTION INTERNAL FIXATION (ORIF) TIBIAL PLATEAU;  Surgeon: Renette Butters, MD;  Location: [place];  Service: Orthopedics;  Laterality: Left;  . TEE WITHOUT CARDIOVERSION N/A 06/08/2016   Procedure: TRANSESOPHAGEAL ECHOCARDIOGRAM (TEE);  Surgeon: [name], MD;  Location: [place];  Service: Open Heart Surgery;  Laterality: N/A;  . TUBAL LIGATION    . VAGINAL HYSTERECTOMY  05/2003    Family History  Problem Relation Age of Onset  . Heart disease Mother   . Cervical cancer Sister   . Prostate cancer Brother   . Colon cancer Neg Hx     Social History   Social History  . Marital status: Divorced    Spouse name: N/A  . Number of children: N/A  . Years of education: N/A   Occupational History  . Not on file.   Social History Main Topics  . Smoking status: Former Smoker    Packs/day: 0.12    Years: 45.00    Types: Cigarettes    Quit date: 11/16/2016  . Smokeless tobacco: Never Used  . Alcohol use 18.0 oz/week    23 Cans of beer, 7 Standard drinks or equivalent per week     Comment: 11/24/2016 "beer 1-2  32-40oz beers/day per day"  . Drug use: Yes    Types: Cocaine     Comment: last used cocaine less than a  month ago  . Sexual activity: Not Currently   Other Topics Concern  . Not on file   Social History Narrative  . No narrative on file   Review of systems: Review of Systems  Constitutional: Negative for fever and chills.  HENT: Negative.   Eyes: Negative for blurred vision.  Respiratory: as per HPI  Cardiovascular: Negative for chest pain and palpitations.  Gastrointestinal: Negative for vomiting, diarrhea, blood per rectum. Genitourinary: Negative for dysuria, urgency, frequency and hematuria.  Musculoskeletal: Negative for myalgias, back pain and joint pain.  Skin: Negative for itching and rash.  Neurological: Negative for dizziness, tremors, focal weakness, seizures and loss of consciousness.  Endo/Heme/Allergies: Negative for environmental allergies.  Psychiatric/Behavioral: Negative for depression, suicidal ideas and hallucinations.  All other systems reviewed and are negative.  Physical Exam: Blood pressure 128/78, pulse 72, height _0  (1.575 m), weight 219 lb 9.6 oz (99.6 kg), last menstrual period 12/30/1994, SpO2 91 %. Gen:      No acute distress HEENT:  EOMI, sclera anicteric Neck:     No masses; no thyromegaly Lungs:    Clear to auscultation bilaterally; normal respiratory effort CV:         Regular rate and rhythm; no murmurs Abd:      + bowel sounds; soft, non-tender; no palpable masses, no distension Ext:    No edema; adequate peripheral perfusion Skin:      Warm and dry; no rash Neuro: alert and oriented x 3 Psych: normal mood and affect  Data Reviewed: PFTs 06/04/16 FVC 1.89 (76%) FEV1 1.36 (69%) F/F 72 TLC 78% DLCO 44% Moderate obstruction, minimal restriction, severe diffusion defect  CT scan 11/24/11-stable subcentimeter pulmonary nodules compared to 2009 Chest x-ray 11/26/16-no pneumonia, effusion. Probable mild chronic fibrotic changes. CT high resolution 03/11/17-no interstitial lung disease, moderate emphysema, enlarged pulmonary trunk, aortic  atherosclerosis. Multiple small pulmonary nodules stable since 2013. I reviewed all images personally  Assessment:  COPD GOLD B  Continue on trelegy. She does not have a prescription for albuterol inhaler. We will call it in She will continue on supplemental o2. Refer to pulmonary rehab  H/O Lupus She has history of lupus which appears to have been confined to the skin previously. There was suspicion of lung involvement but the high res CT does not show any ILD. We will continue to monitor.   Former smoker Has stopped smoking since January 2018. I congratulated her and encouraged her to stay off cigarettes.  SubCm pulmonary nodules Likely benign as they have been stable sine 2013. She will need screening CT of chest starting next year.  Plan/Recommendations: - Continue O2, trelegy - Call in albuterol rescue inhaler - Refer to pulmonary rehab  Return in 6 months  Marshell Garfinkel MD [facility] Pager [phone] 04/19/2017, 11:41 AM  CC: Ledell Noss, MD

## 2017-04-20 ENCOUNTER — Ambulatory Visit (HOSPITAL_COMMUNITY)
Admission: EM | Admit: 2017-04-20 | Discharge: 2017-04-20 | Disposition: A | Discharge status: Home / Self Care | Payer: Medicare Other | Attending: Internal Medicine | Admitting: Internal Medicine

## 2017-04-20 ENCOUNTER — Encounter (HOSPITAL_COMMUNITY): Payer: Self-pay | Admitting: Emergency Medicine

## 2017-04-20 DIAGNOSIS — S00511D Abrasion of lip, subsequent encounter: Secondary | ICD-10-CM

## 2017-04-20 NOTE — Discharge Instructions (Signed)
Because you are on a blood thinner you are more likely to bleed from very small nicks and cuts and abrasions. Currently there is no bleeding from the lip. Silver nitrate was applied over the area of bleeding to help cauterize the blood vessels and hopefully prevent additional bleeding. Recommend you go to the drugstore and purchase QR powder to place on wounds that bleed. He may also want to purchase silver nitrate sticks to put on areas that are bleeding. Try not to rub your lip as much as possible.

## 2017-04-20 NOTE — ED Provider Notes (Signed)
CSN: [id number]     Arrival date & time 04/20/17  1007 History   First MD Initiated Contact with Patient 04/20/17 1107     Chief Complaint  Patient presents with  . Follow-up   (Consider location/radiation/quality/duration/timing/severity/associated sxs/prior Treatment) 61 year old female with multiple chronic illnesses and taking eloquent's was seen in the urgent care by me one week ago. She had an abrasion of some type to the lower lip which continued to bleed. The patient was unable to stop it over a period of 2-3 days. Silver nitrate initially applied did not stop the bleeding. And X type suture was applied to compress the bleeding vein. This quickly worked and there was no other bleeding. She was observed for nearly an hour and no bleeding. She states that over the past with 3 days she has had bleeding off and on the just spontaneously occurs. The suture is intact. It appears as though the bleeding may be using from one of the suture puncture areas. There is no active bleeding at this time. The wound is dry.      Past Medical History:  Diagnosis Date  . CAD (coronary artery disease)    NSTEMI 08/2011:  LHC 08/21/11: mLAD 60-70%, pCFX occluded, dRCA chronic occlusion with L-R collats, EF 40-45%, inf AK.  PCI:  BMS to CFX.  Marland Kitchen COPD (chronic obstructive pulmonary disease) ([place]) 11/17/2016  . Depression   . Fibroids   . Fibromyalgia   . GERD (gastroesophageal reflux disease)   . Gout   . History of microcytic hypochromic anemia   . HLD (hyperlipidemia)    Chol = 235, LDL = 156 (08/2010)  . Hypertension   . Insomnia   . Lupus 2009   ANA + 10/2008, repeat ANA + (05/2009), on that visit 05/2009 following labs obtained RF <20, CRP <0.4, ANA titers 1:80,   . Myocardial infarction ([place]) 1990s  . On home oxygen therapy    "2L; 24/7 as of today" (11/24/2016)  . Pulmonary nodules 05/2008   noted on CXR and CT 05/2008, repeat CT 10/2008 - Stable small bilateral pulmonary nodules measuring up to 6  m m  . Stroke [facility]) 04/2014   "mini stroke"  . Substance abuse    cocaine    Past Surgical History:  Procedure Laterality Date  . CARDIAC CATHETERIZATION N/A 06/02/2016   Procedure: Left Heart Cath and Coronary Angiography;  Surgeon: Belva Crome, MD;  Location: [place] [facility];  Service: Cardiovascular;  Laterality: N/A;  . COLONOSCOPY N/A 07/14/2013   Procedure: COLONOSCOPY;  Surgeon: Beryle Beams, MD;  [facility];  Service: Endoscopy;  Laterality: N/A;  . CORONARY ANGIOPLASTY WITH STENT PLACEMENT    . CORONARY ARTERY BYPASS GRAFT N/A 06/08/2016   Procedure: CORONARY ARTERY BYPASS GRAFTING (CABG) X 4  With Right Greater Saphenous vein endoscopic harvesting;  Surgeon: [name], MD;  Location: [place];  Service: Open Heart Surgery;  Laterality: N/A;  . FRACTURE SURGERY    . ORIF TIBIA PLATEAU Left 08/22/2013   Procedure: OPEN REDUCTION INTERNAL FIXATION (ORIF) TIBIAL PLATEAU;  Surgeon: Renette Butters, MD;  Location: [place];  Service: Orthopedics;  Laterality: Left;  . TEE WITHOUT CARDIOVERSION N/A 06/08/2016   Procedure: TRANSESOPHAGEAL ECHOCARDIOGRAM (TEE);  Surgeon: [name], MD;  Location: [place];  Service: Open Heart Surgery;  Laterality: N/A;  . TUBAL LIGATION    . VAGINAL HYSTERECTOMY  05/2003   Family History  Problem Relation Age of Onset  . Heart  disease Mother   . Cervical cancer Sister   . Prostate cancer Brother   . Colon cancer Neg Hx    Social History  Substance Use Topics  . Smoking status: Former Smoker    Packs/day: 0.12    Years: 45.00    Types: Cigarettes    Quit date: 11/16/2016  . Smokeless tobacco: Never Used  . Alcohol use 18.0 oz/week    23 Cans of beer, 7 Standard drinks or equivalent per week     Comment: 11/24/2016 "beer 1-2  32-40oz beers/day per day"   OB History    No data available     Review of Systems  Constitutional: Negative.   HENT:       As per history of present illness  Skin: Negative.   Neurological:  Negative.   All other systems reviewed and are negative.   Allergies  Patient has no known allergies.  Home Medications   Prior to Admission medications   Medication Sig Start Date End Date Taking? Authorizing Provider  albuterol (PROVENTIL HFA;VENTOLIN HFA) 108 (90 Base) MCG/ACT inhaler Inhale 2 puffs into the lungs every 6 (six) hours as needed for wheezing or shortness of breath. 12/08/16   Holley Raring, MD  albuterol (PROVENTIL HFA;VENTOLIN HFA) 108 (90 Base) MCG/ACT inhaler Inhale 2 puffs into the lungs every 6 (six) hours as needed for wheezing or shortness of breath. 04/19/17   Mannam, Hart Robinsons, MD  amLODipine (NORVASC) 5 MG tablet TAKE 1 TABLET BY MOUTH EVERY DAY 02/02/17   Ledell Noss, MD  apixaban (ELIQUIS) 5 MG TABS tablet Take 5 mg by mouth daily.     [provider]  atorvastatin (LIPITOR) 80 MG tablet Take 80 mg by mouth daily. 12/10/16   [provider]  carvedilol (COREG) 12.5 MG tablet TAKE 1 TABLET(12.5 MG) BY MOUTH TWICE DAILY 03/07/16   Liberty Handy, MD  Fluticasone-Umeclidin-Vilant (TRELEGY ELLIPTA) 100-62.5-25 MCG/INH AEPB Inhale 1 puff into the lungs daily. 03/04/17   Mannam, Hart Robinsons, MD  furosemide (LASIX) 40 MG tablet Take 2 tablets (80 mg total) by mouth every morning and take 1 tablet (40 mg total) by mouth every evening. 03/09/17   Croitoru, Mihai, MD  naproxen (NAPROSYN) 500 MG tablet TAKE 1 TABLET BY MOUTH AT BEDTIME 12/08/16   Ledell Noss, MD  pantoprazole (PROTONIX) 40 MG tablet TAKE 1 TABLET(40 MG) BY MOUTH DAILY 12/12/16   Ledell Noss, MD  potassium chloride SA (K-DUR,KLOR-CON) 20 MEQ tablet Take 1 tablet (20 mEq total) by mouth daily. 07/27/16   [name], MD  valsartan (DIOVAN) 320 MG tablet Take 320 mg by mouth daily. 10/04/16   [provider]  Vitamin D, Ergocalciferol, (DRISDOL) 50000 units CAPS capsule Take 1 capsule (50,000 Units total) by mouth every 7 (seven) days. 01/04/17   Ledell Noss, MD   Meds Ordered and Administered this  Visit  Medications - No data to display  BP 123/67 (BP Location: Right Arm)   Pulse 68   Temp 97.8 F (36.6 C) (Oral)   Resp 20   LMP 12/30/1994   SpO2 92%  No data found.   Physical Exam  Constitutional: She is oriented to person, place, and time. She appears well-developed and well-nourished.  HENT:  The lower lip lesion is crusting. Under magnification there appears to be in area of incomplete healing where a suture mark was made. Suture remains. No other site of bleeding.  Neck: Neck supple.  Pulmonary/Chest: Effort normal.  Musculoskeletal: Normal range of motion.  Neurological: She is alert and oriented to person, place, and time.  Skin: Skin is warm and dry.  Psychiatric: She has a normal mood and affect.  Nursing note and vitals reviewed.   Urgent Care Course     Procedures (including critical care time) Silver nitrate stick placed over the wound of the lower lip. No active bleeding. The patient is sized to obtain the materials listed in the instructions. Labs Review Labs Reviewed - No data to display  Imaging Review No results found.   Visual Acuity Review  Right Eye Distance:   Left Eye Distance:   Bilateral Distance:    Right Eye Near:   Left Eye Near:    Bilateral Near:         MDM   1. Abrasion of lip, subsequent encounter   Intermittent bleeding of the lower lip. On arrival no active bleeding. Silver nitrate placed on the lower lip to assist in cautery. The suture remains in place. Because you are on a blood thinner you are more likely to bleed from very small nicks and cuts and abrasions. Currently there is no bleeding from the lip. Silver nitrate was applied over the area of bleeding to help cauterize the blood vessels and hopefully prevent additional bleeding. Recommend you go to the drugstore and purchase QR powder to place on wounds that bleed. He may also want to purchase silver nitrate sticks to put on areas that are bleeding. Try not to  rub your lip as much as possible.      Janne Napoleon, NP 04/20/17 1146

## 2017-04-20 NOTE — ED Triage Notes (Signed)
The patient presented to the [facility] with a complaint of a follow up to a lip laceration.

## 2017-04-23 ENCOUNTER — Telehealth: Payer: Self-pay | Admitting: Pulmonary Disease

## 2017-04-23 DIAGNOSIS — J438 Other emphysema: Secondary | ICD-10-CM

## 2017-04-23 NOTE — Telephone Encounter (Signed)
Pulmonary rehab called to let us know that they will need a new order placed for pulmonary rehab. The current one was listed with a diagnosis of COPD. Medicare will not pay for pulmonary rehab with this diagnosis.  PM - please advise what other diagnosis can be used. Thanks.

## 2017-04-27 ENCOUNTER — Ambulatory Visit (INDEPENDENT_AMBULATORY_CARE_PROVIDER_SITE_OTHER): Payer: Medicare Other | Admitting: Internal Medicine

## 2017-04-27 ENCOUNTER — Encounter: Payer: Self-pay | Admitting: Internal Medicine

## 2017-04-27 VITALS — BP 143/79 | HR 78 | Temp 97.5°F | Wt 216.5 lb

## 2017-04-27 DIAGNOSIS — F32A Depression, unspecified: Secondary | ICD-10-CM

## 2017-04-27 DIAGNOSIS — Z124 Encounter for screening for malignant neoplasm of cervix: Secondary | ICD-10-CM

## 2017-04-27 DIAGNOSIS — F17211 Nicotine dependence, cigarettes, in remission: Secondary | ICD-10-CM | POA: Diagnosis not present

## 2017-04-27 DIAGNOSIS — I5042 Chronic combined systolic (congestive) and diastolic (congestive) heart failure: Secondary | ICD-10-CM

## 2017-04-27 DIAGNOSIS — I10 Essential (primary) hypertension: Secondary | ICD-10-CM

## 2017-04-27 DIAGNOSIS — S00511D Abrasion of lip, subsequent encounter: Secondary | ICD-10-CM

## 2017-04-27 DIAGNOSIS — X58XXXD Exposure to other specified factors, subsequent encounter: Secondary | ICD-10-CM | POA: Diagnosis not present

## 2017-04-27 DIAGNOSIS — F418 Other specified anxiety disorders: Secondary | ICD-10-CM | POA: Diagnosis not present

## 2017-04-27 DIAGNOSIS — I110 Hypertensive heart disease with heart failure: Secondary | ICD-10-CM

## 2017-04-27 DIAGNOSIS — F419 Anxiety disorder, unspecified: Secondary | ICD-10-CM

## 2017-04-27 DIAGNOSIS — F329 Major depressive disorder, single episode, unspecified: Secondary | ICD-10-CM

## 2017-04-27 DIAGNOSIS — E785 Hyperlipidemia, unspecified: Secondary | ICD-10-CM

## 2017-04-27 DIAGNOSIS — Z7901 Long term (current) use of anticoagulants: Secondary | ICD-10-CM

## 2017-04-27 DIAGNOSIS — J441 Chronic obstructive pulmonary disease with (acute) exacerbation: Secondary | ICD-10-CM | POA: Diagnosis not present

## 2017-04-27 DIAGNOSIS — Z79899 Other long term (current) drug therapy: Secondary | ICD-10-CM

## 2017-04-27 DIAGNOSIS — I480 Paroxysmal atrial fibrillation: Secondary | ICD-10-CM | POA: Diagnosis not present

## 2017-04-27 DIAGNOSIS — Z9981 Dependence on supplemental oxygen: Secondary | ICD-10-CM

## 2017-04-27 DIAGNOSIS — K219 Gastro-esophageal reflux disease without esophagitis: Secondary | ICD-10-CM

## 2017-04-27 DIAGNOSIS — Z951 Presence of aortocoronary bypass graft: Secondary | ICD-10-CM

## 2017-04-27 MED ORDER — DULOXETINE HCL 40 MG PO CPEP: 40.0000 mg | ORAL_CAPSULE | Freq: Every day | ORAL | 0 refills | Status: DC

## 2017-04-27 MED ORDER — BENZONATATE 100 MG PO CAPS: 100.0000 mg | ORAL_CAPSULE | Freq: Three times a day (TID) | ORAL | 0 refills | Status: DC | PRN

## 2017-04-27 NOTE — Telephone Encounter (Signed)
Spoke with Cloyde Reams at [employer], who states pt's PFT qualify her for COPD stage 1. Medicare will not cover for stage 1, it has to be stage 2 and above. Molly states pt's FEV1 is 72%, medicare requires 70% or below for COPD dx's. Cloyde Reams ask that we place order with different dx.  PM please advise if we can use emphysema. Thanks.

## 2017-04-27 NOTE — Patient Instructions (Addendum)
Elizabeth [name],  It was a pleasure seeing you today   For your mood, lets try this new medication Cymbalta   For your cough, I have prescribed you tessalon   Schedule a follow up appointment in one month

## 2017-04-27 NOTE — Progress Notes (Addendum)
CC: depressed mood, cough   HPI:  Elizabeth [name] is a 61 y.o. PMH Lupus, COPD GOLD 2 (7/17 FEV1 69% FEV1/FVC ), CAD s/p CABG 0/27, chronic diastolic CHF with elevated PA pressure, paroxysmal atrial fibrillation on eliquis, prediabetes, hypertension, hyperlipidemia, GERD.   Mild COPD exacerbation  Presents with a productive cough for the past 2 weeks. The cough is associated with congestion, sore throat, and chills. It started 2 weeks ago when she was with her sister who had similar symptoms. She denies wheeze, worsening of her shortness of breath, chest pain, peripheral edema, or orthopnea. She tried using some tessalon perls and these worked to improve the cough. She describes being frustrated with her diagnosis of COPD and very worried that she is going to die from this. She comes in today not wearing her portable oxygen. She says that her portable tank is malfunctioning and that she has home health going out this evening to fix the tank. She is scheduled to begin pulmonary rehab two weeks from now.  Depression  Today she is tearful when speaking about her recent medical history, she has had CABG and been prescribed home oxygen in the past year and is frustrated that things are not getting better. PHQ score is 12. She denies suicidal ideations.   Lip abrasion She was evaluated for a bleeding lip lesion in the ED, she reports that this abrasion has continued to bleed. She has been using carmex lip gloss on it.   Past Medical History:  Diagnosis Date  . CAD (coronary artery disease)    NSTEMI 08/2011:  LHC 08/21/11: mLAD 60-70%, pCFX occluded, dRCA chronic occlusion with L-R collats, EF 40-45%, inf AK.  PCI:  BMS to CFX.  Marland Kitchen COPD (chronic obstructive pulmonary disease) ([place]) 11/17/2016  . Depression   . Fibroids   . Fibromyalgia   . GERD (gastroesophageal reflux disease)   . Gout   . History of microcytic hypochromic anemia   . HLD (hyperlipidemia)    Chol = 235, LDL = 156  (08/2010)  . Hypertension   . Insomnia   . Lupus 2009   ANA + 10/2008, repeat ANA + (05/2009), on that visit 05/2009 following labs obtained RF <20, CRP <0.4, ANA titers 1:80,   . Myocardial infarction ([place]) 1990s  . On home oxygen therapy    "2L; 24/7 as of today" (11/24/2016)  . Pulmonary nodules 05/2008   noted on CXR and CT 05/2008, repeat CT 10/2008 - Stable small bilateral pulmonary nodules measuring up to 6 m m  . Stroke [facility]) 04/2014   "mini stroke"  . Substance abuse    cocaine     Review of Systems:  Please refer to HPI and problem list for pertinent ROS.   Physical Exam:  Vitals:   04/27/17 1329  BP: (!) 143/79  Pulse: 78  Temp: 97.5 F (36.4 C)  TempSrc: Oral  SpO2: (!) 82%  Weight: 216 lb 8 oz (98.2 kg)   Physical Exam  Constitutional: She is oriented to person, place, and time. She appears well-developed and well-nourished. No distress.  HENT:  Head: Normocephalic and atraumatic.  Mouth/Throat: No oropharyngeal exudate.  1cm abrasion over her lower vermillion border. Appears to be healing, non bleeding.   Eyes: Conjunctivae are normal. Right eye exhibits no discharge. Left eye exhibits no discharge. No scleral icterus.  Cardiovascular: Normal rate and regular rhythm.   No murmur heard. No peripheral edema   Pulmonary/Chest: Breath sounds normal. No respiratory distress. She  has no wheezes. She has no rales.  On O2 nasal canula   Abdominal: Soft. Bowel sounds are normal. She exhibits no distension. There is no tenderness. There is no guarding.  Neurological: She is alert and oriented to person, place, and time.  Skin: Skin is warm and dry. She is not diaphoretic.  Psychiatric:  Tearful    Assessment & Plan:   See Encounters Tab for problem based charting.  Mild COPD exacerbation  Symptoms are suggestive of URI vs Mild COPD exacerbation. Her lung are clear on exam today but she does have a cough. I do not think she needs steroids or antibiotics for this  mild exacerbation. It is very concerning to me that she is presenting without portable oxygen therapy again. She is clearly very frustrated with her disease, I shared with her in this frustration. She is on triple therapy and had albuterol ordered at her last pulmonology visit but not picked this up yet.  - ordered tessalon perls  -continue triple therapy inhaler  - encouraged to pick up albuterol  - encouraged to attend pulmonary rehab -Dr. Vaughan Browner is following, I appreciate his recommendations   Depression  2/2 multiple medical co morbidities. PHQ9 is 12 today. She has been on prozac in the past but experienced anxiety with taking this medication. I will attempt a trial of alternative mechanism of action.  - Prescribed duloxetine 30 mg daily  - RTC in 1 month   Lip abrasion  Had our in office wound care nurse take a look at this wound. She feels that it is healing but needs a better form of lubrication and avoidance of abrasive application with lip gloss. She provided Elizabeth [name] a sample of water based gel and application tips and we advised her to switch from carmex to Vaseline.   HTN  BP Readings from Last 3 Encounters:  04/27/17 (!) 143/79  04/20/17 123/67  04/19/17 128/78  Blood pressure is not controlled today, she reports she didn't take her blood pressure medications today. Her blood pressure has been well controlled on checks earlier this month on her current regiment.  - continue Amlodipine 5 qd  -continue Valsartan 320 mg qd   CHFpEF Last echo 11/2016; EF 47-583, grade 2 diastolic dysfunction, hypokinesia of the midinferolateral myocardium. Euvolemic on exam today. Denies orthopnea.  - continue Carvedilol 12.5 mg BID  - continue Lasix 80 mg qAM, 40 mg qPM  - Dr. Sallyanne Kuster is following   Paroxysmal atrial fibrillation  Hx of TIA  NSR on exam today. CHADSVASC= 5 ( TIA, female, HTN, CAD) - refilled Eliquis 5 mg qd  -Dr. Sallyanne Kuster is following, I appreciate his recommendations     Patient discussed with Dr. Daryll Drown

## 2017-04-28 ENCOUNTER — Telehealth: Payer: Self-pay | Admitting: *Deleted

## 2017-04-28 NOTE — Telephone Encounter (Signed)
Yes. Ok to use emphysema as diagnosis for ordering pulmonary rehab.  Marshell Garfinkel MD

## 2017-04-28 NOTE — Telephone Encounter (Signed)
New order placed for pulm rehab with emphysema as dx.  Nothing further needed.

## 2017-04-28 NOTE — Telephone Encounter (Signed)
PA information was sent to CoverMyMeds for Duloxetine 40 mg tablets.  Awaiting a decision within 72 hours.  Sander Nephew RN 04/28/2018 11:24 AM

## 2017-04-29 DIAGNOSIS — S00511A Abrasion of lip, initial encounter: Secondary | ICD-10-CM | POA: Insufficient documentation

## 2017-04-29 MED ORDER — APIXABAN 5 MG PO TABS: 5.0000 mg | ORAL_TABLET | Freq: Every day | ORAL | 4 refills | Status: DC

## 2017-04-29 NOTE — Assessment & Plan Note (Signed)
Had our in office wound care nurse take a look at this wound. She feels that it is healing but needs a better form of lubrication and avoidance of abrasive application with lip gloss. She provided Ms. Haran a sample of water based gel and application tips and we advised her to switch from carmex to Vaseline.

## 2017-04-29 NOTE — Assessment & Plan Note (Signed)
Symptoms are suggestive of URI vs Mild COPD exacerbation. Her lung are clear on exam today but she does have a cough. I do not think she needs steroids or antibiotics for this mild exacerbation. It is very concerning to me that she is presenting without portable oxygen therapy again. She is clearly very frustrated with her disease, I shared with her in this frustration. She is on triple therapy and had albuterol ordered at her last pulmonology visit but not picked this up yet.  - ordered tessalon perls  -continue triple therapy inhaler  - encouraged to pick up albuterol  - encouraged to attend pulmonary rehab -Dr. Vaughan Browner is following, I appreciate his recommendations

## 2017-04-29 NOTE — Assessment & Plan Note (Signed)
2/2 multiple medical co morbidities. PHQ9 is 12 today. She has been on prozac in the past but experienced anxiety with taking this medication. I will attempt a trial of alternative mechanism of action.  - Prescribed duloxetine 40 mg daily  - RTC in 1 month

## 2017-04-29 NOTE — Assessment & Plan Note (Signed)
BP Readings from Last 3 Encounters:  04/27/17 (!) 143/79  04/20/17 123/67  04/19/17 128/78  Blood pressure is not controlled today, she reports she didn't take her blood pressure medications today. Her blood pressure has been well controlled on checks earlier this month on her current regiment.  - continue Amlodipine 5 qd  -continue Valsartan 320 mg qd

## 2017-04-29 NOTE — Assessment & Plan Note (Signed)
NSR on exam today. CHADSVASC= 5 ( TIA, female, HTN, CAD) - refilled Eliquis 5 mg qd  -Dr. Sallyanne Kuster is following, I appreciate his recommendations

## 2017-04-29 NOTE — Assessment & Plan Note (Signed)
Last echo 11/2016; EF 56-433, grade 2 diastolic dysfunction, hypokinesia of the midinferolateral myocardium. Euvolemic on exam today. Denies orthopnea.  - continue Carvedilol 12.5 mg BID  - continue Lasix 80 mg qAM, 40 mg qPM  - Dr. Sallyanne Kuster is following

## 2017-04-30 ENCOUNTER — Telehealth: Payer: Self-pay | Admitting: Pulmonary Disease

## 2017-04-30 NOTE — Telephone Encounter (Signed)
Pt states she has received 3 phone calls from our office. There is no documentation showing that we have attempted to contact pt.  I have made pt aware that this may had been in regards to pulm rehab, as we have been in contact with pulm rehab. Pt states she starts pulm rehab on 05/03/17. Nothing further needed.

## 2017-05-03 ENCOUNTER — Encounter (HOSPITAL_COMMUNITY)
Admission: RE | Admit: 2017-05-03 | Discharge: 2017-05-03 | Disposition: A | Discharge status: Home / Self Care | Payer: Medicare Other | Source: Ambulatory Visit | Attending: Pulmonary Disease | Admitting: Pulmonary Disease

## 2017-05-03 ENCOUNTER — Encounter (HOSPITAL_COMMUNITY): Payer: Self-pay

## 2017-05-03 VITALS — BP 138/75 | HR 81

## 2017-05-03 DIAGNOSIS — J438 Other emphysema: Secondary | ICD-10-CM | POA: Insufficient documentation

## 2017-05-03 NOTE — Addendum Note (Signed)
Addended by: Gilles Chiquito B on: 05/03/2017 02:36 PM   Modules accepted: Level of Service

## 2017-05-03 NOTE — Progress Notes (Signed)
Internal Medicine Clinic Attending  Case discussed with Dr. Hetty Ely at the time of the visit.  We reviewed the resident's history and exam and pertinent patient test results.  I agree with the assessment, diagnosis, and plan of care documented in the resident's note.

## 2017-05-03 NOTE — Progress Notes (Signed)
Elizabeth [name] 61 y.o. female Pulmonary Rehab Orientation Note Patient arrived today in Cardiac and Pulmonary Rehab for orientation to Pulmonary Rehab. She was transported from [employer] via wheel chair. She does carry portable oxygen. Per pt, she uses oxygen continuously. Color good, skin warm and dry. Patient is oriented to time and place. Patient's medical history, psychosocial health, and medications reviewed. Psychosocial assessment reveals patient lives with 2 sisters and 3 neices. Pt is currently unemployed, disabled from lupus and fibromyalgia. Pt hobbies include cooking. Pt reports her stress level is moderate. Areas of stress/anxiety include Health.  Pt does exhibit signs of depression. Signs of depression include sadness and difficulty maintaining sleep. PHQ2/9 score 3/16. Pt shows fair  coping skills with positive outlook . Patient does suffer from depression, has been prescribed an antidepressant, but refuses to take it and refuses counseling. Offered emotional support and reassurance. Will continue to monitor and evaluate progress toward psychosocial goal(s) of improvement in quality of life with the exercise program in pulmonary rehab. Physical assessment reveals heart rate is normal, breath sounds clear to auscultation, no wheezes, rales, or rhonchi, diminished. Grip strength equal, strong. Distal pulses 1+ bilateral posterior tibial pulses present with 2+ bilateral ankle edema. Patient reports she does take medications as prescribed. Patient states she follows a Regular diet. patient has gained 60 pounds since February 2018 after getting the flu and being placed on 2 liters of oxygen since. Patient's weight will be monitored closely. Discussed ways to cut back on food intake, our department nutritionist will meet with patient to discuss ways to cut calories.  Demonstration and practice of PLB using pulse oximeter. Patient able to return demonstration satisfactorily. Safety and hand  hygiene in the exercise area reviewed with patient. Patient voices understanding of the information reviewed. Department expectations discussed with patient and achievable goals were set. The patient shows enthusiasm about attending the program and we look forward to working with this nice lady. The patient is scheduled for a 6 min walk test on Tuesday, May 04, 2017 @ 3:15 pm and to begin exercise on Tuesday May 11, 2017 in the 1030 class.   1200-1430 time spent with orientation

## 2017-05-04 ENCOUNTER — Encounter (HOSPITAL_COMMUNITY)
Admission: RE | Admit: 2017-05-04 | Discharge: 2017-05-04 | Disposition: A | Discharge status: Home / Self Care | Payer: Medicare Other | Source: Ambulatory Visit | Attending: Pulmonary Disease | Admitting: Pulmonary Disease

## 2017-05-04 DIAGNOSIS — J438 Other emphysema: Secondary | ICD-10-CM | POA: Diagnosis not present

## 2017-05-04 NOTE — Progress Notes (Signed)
Pulmonary Individual Treatment Plan  Patient Details  Name: KAILE BIXLER MRN: [id number] Date of Birth: [birth year] Referring Provider:     Pulmonary Rehab Walk Test from 05/04/2017 in [place]  Referring Provider  Dr. Vaughan Browner      Initial Encounter Date:    Pulmonary Rehab Walk Test from 05/04/2017 in [place]  Date  05/04/17  Referring Provider  Dr. Vaughan Browner      Visit Diagnosis: Other emphysema ([place])  Patient's Home Medications on Admission:   Current Outpatient Prescriptions:  .  albuterol (PROVENTIL HFA;VENTOLIN HFA) 108 (90 Base) MCG/ACT inhaler, Inhale 2 puffs into the lungs every 6 (six) hours as needed for wheezing or shortness of breath., Disp: 1 Inhaler, Rfl: 2 .  albuterol (PROVENTIL HFA;VENTOLIN HFA) 108 (90 Base) MCG/ACT inhaler, Inhale 2 puffs into the lungs every 6 (six) hours as needed for wheezing or shortness of breath., Disp: 1 Inhaler, Rfl: 2 .  amLODipine (NORVASC) 5 MG tablet, TAKE 1 TABLET BY MOUTH EVERY DAY, Disp: 30 tablet, Rfl: 11 .  apixaban (ELIQUIS) 5 MG TABS tablet, Take 1 tablet (5 mg total) by mouth daily. (Patient not taking: Reported on 05/03/2017), Disp: 30 tablet, Rfl: 4 .  atorvastatin (LIPITOR) 80 MG tablet, Take 80 mg by mouth daily., Disp: , Rfl: 3 .  benzonatate (TESSALON PERLES) 100 MG capsule, Take 1 capsule (100 mg total) by mouth 3 (three) times daily as needed for cough., Disp: 30 capsule, Rfl: 0 .  carvedilol (COREG) 12.5 MG tablet, TAKE 1 TABLET(12.5 MG) BY MOUTH TWICE DAILY, Disp: 180 tablet, Rfl: 3 .  DULoxetine HCl 40 MG CPEP, Take 40 mg by mouth daily., Disp: 30 capsule, Rfl: 0 .  Fluticasone-Umeclidin-Vilant (TRELEGY ELLIPTA) 100-62.5-25 MCG/INH AEPB, Inhale 1 puff into the lungs daily., Disp: 1 each, Rfl: 5 .  furosemide (LASIX) 40 MG tablet, Take 2 tablets (80 mg total) by mouth every morning and take 1 tablet (40 mg total) by mouth every evening., Disp: 270  tablet, Rfl: 3 .  pantoprazole (PROTONIX) 40 MG tablet, TAKE 1 TABLET(40 MG) BY MOUTH DAILY, Disp: 90 tablet, Rfl: 3 .  potassium chloride SA (K-DUR,KLOR-CON) 20 MEQ tablet, Take 1 tablet (20 mEq total) by mouth daily., Disp: 90 tablet, Rfl: 3 .  valsartan (DIOVAN) 320 MG tablet, Take 320 mg by mouth daily., Disp: , Rfl: 11 .  Vitamin D, Ergocalciferol, (DRISDOL) 50000 units CAPS capsule, Take 1 capsule (50,000 Units total) by mouth every 7 (seven) days., Disp: 8 capsule, Rfl: 0  Past Medical History: Past Medical History:  Diagnosis Date  . CAD (coronary artery disease)    NSTEMI 08/2011:  LHC 08/21/11: mLAD 60-70%, pCFX occluded, dRCA chronic occlusion with L-R collats, EF 40-45%, inf AK.  PCI:  BMS to CFX.  Marland Kitchen COPD (chronic obstructive pulmonary disease) ([place]) 11/17/2016  . Depression   . Fibroids   . Fibromyalgia   . GERD (gastroesophageal reflux disease)   . Gout   . History of microcytic hypochromic anemia   . HLD (hyperlipidemia)    Chol = 235, LDL = 156 (08/2010)  . Hypertension   . Insomnia   . Lupus 2009   ANA + 10/2008, repeat ANA + (05/2009), on that visit 05/2009 following labs obtained RF <20, CRP <0.4, ANA titers 1:80,   . Myocardial infarction ([place]) 1990s  . On home oxygen therapy    "2L; 24/7 as of today" (11/24/2016)  . Pulmonary nodules 05/2008  noted on CXR and CT 05/2008, repeat CT 10/2008 - Stable small bilateral pulmonary nodules measuring up to 6 m m  . Stroke [facility]) 04/2014   "mini stroke"  . Substance abuse    cocaine     Tobacco Use: History  Smoking Status  . Former Smoker  . Packs/day: 0.12  . Years: 45.00  . Types: Cigarettes  . Quit date: 11/16/2016  Smokeless Tobacco  . Never Used    Labs: Recent Review Flowsheet Data    Labs for ITP Cardiac and Pulmonary Rehab Latest Ref Rng & Units 06/09/2016 06/10/2016 08/18/2016 11/24/2016 03/09/2017   Cholestrol <200 mg/dL - - 163 - 145   LDLCALC <100 mg/dL - - 100 - 72   LDLDIRECT mg/dL - - - - -   HDL >50  mg/dL - - 41(L) - 52   Trlycerides <150 mg/dL - - 108 - 104   Hemoglobin A1c 4.8 - 5.6 % - - - - -   PHART 7.350 - 7.450 - 7.447 - 7.437 -   PCO2ART 32.0 - 48.0 mmHg - 29.6(L) - 37.7 -   HCO3 20.0 - 28.0 mmol/L - 20.5 - 25.0 -   TCO2 0 - 100 mmol/L 21 21 - - -   ACIDBASEDEF 0.0 - 2.0 mmol/L - 3.0(H) - - -   O2SAT % - 92.0 - 94.0 -      Capillary Blood Glucose: Lab Results  Component Value Date   GLUCAP 89 06/16/2016   GLUCAP 105 (H) 06/15/2016   GLUCAP 90 06/15/2016   GLUCAP 99 06/15/2016   GLUCAP 124 (H) 06/15/2016     ADL UCSD:   Pulmonary Function Assessment:     Pulmonary Function Assessment - 05/03/17 1249      Breath   Bilateral Breath Sounds Decreased;Clear   Shortness of Breath Yes;Panic with Shortness of Breath      Exercise Target Goals: Date: 05/04/17  Exercise Program Goal: Individual exercise prescription set with THRR, safety & activity barriers. Participant demonstrates ability to understand and report RPE using BORG scale, to self-measure pulse accurately, and to acknowledge the importance of the exercise prescription.  Exercise Prescription Goal: Starting with aerobic activity 30 plus minutes a day, 3 days per week for initial exercise prescription. Provide home exercise prescription and guidelines that participant acknowledges understanding prior to discharge.  Activity Barriers & Risk Stratification:   6 Minute Walk:     6 Minute Walk    Row Name 05/04/17 1554         6 Minute Walk   Phase Initial     Distance 700 feet     Walk Time -  4 minutes 30seconds     # of Rest Breaks 4  90seconds total     MPH 1.32     METS 2     RPE 15     Perceived Dyspnea  3     Symptoms Yes (comment)     Comments used wheelchair     Resting HR 82 bpm     Resting BP 130/84     Max Ex. HR 103 bpm     Max Ex. BP 138/80       Interval HR   Baseline HR 82     1 Minute HR 97     2 Minute HR 102     3 Minute HR 103     4 Minute HR 98     5  Minute HR 103  6 Minute HR 101     2 Minute Post HR 85     Interval Heart Rate? Yes       Interval Oxygen   Interval Oxygen? Yes     Baseline Oxygen Saturation % 91 %     Baseline Liters of Oxygen 2 L     1 Minute Oxygen Saturation % 88 %     1 Minute Liters of Oxygen 2 L     2 Minute Oxygen Saturation % 82 %     2 Minute Liters of Oxygen 2 L     3 Minute Oxygen Saturation % 88 %     3 Minute Liters of Oxygen 4 L     4 Minute Oxygen Saturation % 89 %     4 Minute Liters of Oxygen 4 L     5 Minute Oxygen Saturation % 86 %     5 Minute Liters of Oxygen 4 L     6 Minute Oxygen Saturation % 87 %     6 Minute Liters of Oxygen 4 L     2 Minute Post Oxygen Saturation % 99 %     2 Minute Post Liters of Oxygen 4 L        Oxygen Initial Assessment:     Oxygen Initial Assessment - 05/03/17 1254      Home Oxygen   Home Oxygen Device Home Concentrator;E-Tanks   Sleep Oxygen Prescription Continuous   Liters per minute 2   Home Exercise Oxygen Prescription Continuous   Liters per minute 2   Home at Rest Exercise Oxygen Prescription Continuous   Liters per minute 2   Compliance with Home Oxygen Use Yes      Oxygen Re-Evaluation:   Oxygen Discharge (Final Oxygen Re-Evaluation):   Initial Exercise Prescription:     Initial Exercise Prescription - 05/04/17 1600      Date of Initial Exercise RX and Referring Provider   Date 05/04/17   Referring Provider Dr. Vaughan Browner     Oxygen   Oxygen Continuous   Liters 4     NuStep   Level 2   Minutes 34   METs 1.5     Track   Laps 8   Minutes 17     Prescription Details   Frequency (times per week) 2   Duration Progress to 45 minutes of aerobic exercise without signs/symptoms of physical distress     Intensity   THRR 40-80% of Max Heartrate 64-129   Ratings of Perceived Exertion 11-13   Perceived Dyspnea 0-4     Progression   Progression Continue progressive overload as per policy without signs/symptoms or physical  distress.     Resistance Training   Training Prescription Yes   Weight blue bands   Reps 10-15      Perform Capillary Blood Glucose checks as needed.  Exercise Prescription Changes:   Exercise Comments:   Exercise Goals and Review:   Exercise Goals Re-Evaluation :   Discharge Exercise Prescription (Final Exercise Prescription Changes):   Nutrition:  Target Goals: Understanding of nutrition guidelines, daily intake of sodium <1549m, cholesterol <205m calories 30% from fat and 7% or less from saturated fats, daily to have 5 or more servings of fruits and vegetables.  Biometrics:     Pre Biometrics - 05/03/17 1255      Pre Biometrics   Grip Strength 25 kg       Nutrition Therapy Plan and Nutrition Goals:   Nutrition Discharge:  Rate Your Plate Scores:   Nutrition Goals Re-Evaluation:   Nutrition Goals Discharge (Final Nutrition Goals Re-Evaluation):   Psychosocial: Target Goals: Acknowledge presence or absence of significant depression and/or stress, maximize coping skills, provide positive support system. Participant is able to verbalize types and ability to use techniques and skills needed for reducing stress and depression.  Initial Review & Psychosocial Screening:     Initial Psych Review & Screening - 05/03/17 1258      Initial Review   Current issues with History of Depression  history of depression, but refuses treatment     Family Dynamics   Good Support System? Yes     Barriers   Psychosocial barriers to participate in program There are no identifiable barriers or psychosocial needs.      Quality of Life Scores:   PHQ-9: Recent Review Flowsheet Data    Depression screen [facility] 2/9 05/03/2017 04/27/2017 01/04/2017 11/17/2016 04/29/2016   Decreased Interest 2 1 0 0 2   Down, Depressed, Hopeless 1 1 0 1 2   PHQ - 2 Score 3 2 0 1 4   Altered sleeping 3 3 - - 3   Tired, decreased energy 3 3 - - 3   Change in appetite 2 0 - - 0   Feeling  bad or failure about yourself  2 1 - - 1   Trouble concentrating 2 1 - - 0   Moving slowly or fidgety/restless 1 1 - - 0   Suicidal thoughts 0 1 - - 0   PHQ-9 Score 16 12 - - 11   Difficult doing work/chores Somewhat difficult Somewhat difficult - - Somewhat difficult     Interpretation of Total Score  Total Score Depression Severity:  1-4 = Minimal depression, 5-9 = Mild depression, 10-14 = Moderate depression, 15-19 = Moderately severe depression, 20-27 = Severe depression   Psychosocial Evaluation and Intervention:     Psychosocial Evaluation - 05/03/17 1259      Psychosocial Evaluation & Interventions   Interventions Encouraged to exercise with the program and follow exercise prescription   Comments she is hoping pulmonary rehab will imorove her quality of life   Continue Psychosocial Services  Follow up required by staff      Psychosocial Re-Evaluation:   Psychosocial Discharge (Final Psychosocial Re-Evaluation):   Education: Education Goals: Education classes will be provided on a weekly basis, covering required topics. Participant will state understanding/return demonstration of topics presented.  Learning Barriers/Preferences:     Learning Barriers/Preferences - 05/03/17 1248      Learning Barriers/Preferences   Learning Preferences Audio;Computer/Internet;Group Instruction;Individual Instruction;Video;Verbal Instruction;Skilled Demonstration;Pictoral      Education Topics: Risk Factor Reduction:  -Group instruction that is supported by a PowerPoint presentation. Instructor discusses the definition of a risk factor, different risk factors for pulmonary disease, and how the heart and lungs work together.     Nutrition for Pulmonary Patient:  -Group instruction provided by PowerPoint slides, verbal discussion, and written materials to support subject matter. The instructor gives an explanation and review of healthy diet recommendations, which includes a  discussion on weight management, recommendations for fruit and vegetable consumption, as well as protein, fluid, caffeine, fiber, sodium, sugar, and alcohol. Tips for eating when patients are short of breath are discussed.   Pursed Lip Breathing:  -Group instruction that is supported by demonstration and informational handouts. Instructor discusses the benefits of pursed lip and diaphragmatic breathing and detailed demonstration on how to preform both.     Oxygen  Safety:  -Group instruction provided by PowerPoint, verbal discussion, and written material to support subject matter. There is an overview of "What is Oxygen" and "Why do we need it".  Instructor also reviews how to create a safe environment for oxygen use, the importance of using oxygen as prescribed, and the risks of noncompliance. There is a brief discussion on traveling with oxygen and resources the patient may utilize.   Oxygen Equipment:  -Group instruction provided by [facility] Staff utilizing handouts, written materials, and equipment demonstrations.   Signs and Symptoms:  -Group instruction provided by written material and verbal discussion to support subject matter. Warning signs and symptoms of infection, stroke, and heart attack are reviewed and when to call the physician/911 reinforced. Tips for preventing the spread of infection discussed.   Advanced Directives:  -Group instruction provided by verbal instruction and written material to support subject matter. Instructor reviews Advanced Directive laws and proper instruction for filling out document.   Pulmonary Video:  -Group video education that reviews the importance of medication and oxygen compliance, exercise, good nutrition, pulmonary hygiene, and pursed lip and diaphragmatic breathing for the pulmonary patient.   Exercise for the Pulmonary Patient:  -Group instruction that is supported by a PowerPoint presentation. Instructor discusses benefits of  exercise, core components of exercise, frequency, duration, and intensity of an exercise routine, importance of utilizing pulse oximetry during exercise, safety while exercising, and options of places to exercise outside of rehab.     Pulmonary Medications:  -Verbally interactive group education provided by instructor with focus on inhaled medications and proper administration.   Anatomy and Physiology of the Respiratory System and Intimacy:  -Group instruction provided by PowerPoint, verbal discussion, and written material to support subject matter. Instructor reviews respiratory cycle and anatomical components of the respiratory system and their functions. Instructor also reviews differences in obstructive and restrictive respiratory diseases with examples of each. Intimacy, Sex, and Sexuality differences are reviewed with a discussion on how relationships can change when diagnosed with pulmonary disease. Common sexual concerns are reviewed.   MD DAY -A group question and answer session with a medical doctor that allows participants to ask questions that relate to their pulmonary disease state.   OTHER EDUCATION -Group or individual verbal, written, or video instructions that support the educational goals of the pulmonary rehab program.   Knowledge Questionnaire Score:   Core Components/Risk Factors/Patient Goals at Admission:     Personal Goals and Risk Factors at Admission - 05/03/17 1258      Core Components/Risk Factors/Patient Goals on Admission    Weight Management Obesity;Weight Loss   Improve shortness of breath with ADL's Yes   Intervention Provide education, individualized exercise plan and daily activity instruction to help decrease symptoms of SOB with activities of daily living.   Expected Outcomes Short Term: Achieves a reduction of symptoms when performing activities of daily living.   Develop more efficient breathing techniques such as purse lipped breathing and  diaphragmatic breathing; and practicing self-pacing with activity Yes   Intervention Provide education, demonstration and support about specific breathing techniuqes utilized for more efficient breathing. Include techniques such as pursed lipped breathing, diaphragmatic breathing and self-pacing activity.   Expected Outcomes Short Term: Participant will be able to demonstrate and use breathing techniques as needed throughout daily activities.   Increase knowledge of respiratory medications and ability to use respiratory devices properly  Yes   Intervention Provide education and demonstration as needed of appropriate use of medications, inhalers, and oxygen  therapy.   Expected Outcomes Short Term: Achieves understanding of medications use. Understands that oxygen is a medication prescribed by physician. Demonstrates appropriate use of inhaler and oxygen therapy.      Core Components/Risk Factors/Patient Goals Review:    Core Components/Risk Factors/Patient Goals at Discharge (Final Review):    ITP Comments:   Comments:

## 2017-05-06 ENCOUNTER — Other Ambulatory Visit: Payer: Self-pay | Admitting: Internal Medicine

## 2017-05-06 MED ORDER — DULOXETINE HCL 30 MG PO CPEP: 30.0000 mg | ORAL_CAPSULE | Freq: Every day | ORAL | 0 refills | Status: DC

## 2017-05-06 NOTE — Addendum Note (Signed)
Addended by: Meryl Dare on: 05/06/2017 05:40 PM   Modules accepted: Orders

## 2017-05-10 ENCOUNTER — Other Ambulatory Visit: Payer: Self-pay | Admitting: Internal Medicine

## 2017-05-10 NOTE — Telephone Encounter (Signed)
Approved 30-day supply w/no refills on 05/06/2017-pharmacy requesting it be changed to 90-day supply.  Please advise.[name], Kadie Balestrieri Cassady6/25/20189:02 AM

## 2017-05-11 ENCOUNTER — Encounter (HOSPITAL_COMMUNITY)
Admission: RE | Admit: 2017-05-11 | Discharge: 2017-05-11 | Disposition: A | Discharge status: Home / Self Care | Payer: Medicare Other | Source: Ambulatory Visit | Attending: Pulmonary Disease | Admitting: Pulmonary Disease

## 2017-05-11 VITALS — Wt 217.4 lb

## 2017-05-11 DIAGNOSIS — J438 Other emphysema: Secondary | ICD-10-CM

## 2017-05-11 NOTE — Progress Notes (Signed)
Daily Session Note  Patient Details  Name: Elizabeth [name] MRN: [id number] Date of Birth: 26-Aug-1956 Referring Provider:     Pulmonary Rehab Walk Test from 05/04/2017 in [place]  Referring Provider  Dr. Vaughan Browner      Encounter Date: 05/11/2017  Check In:     Session Check In - 05/11/17 1046      Check-In   Location [facility]   Staff Present Rosebud Poles, RN, BSN;Shuntia Exton, MS, ACSM RCEP, Exercise Physiologist;Lisa Ysidro Evert, RN   Supervising physician immediately available to respond to emergencies [facility] immediately available   Physician(s) Dr. Nada Maclachlan   Medication changes reported     No   Fall or balance concerns reported    No   Tobacco Cessation No Change   Warm-up and Cool-down Performed as group-led instruction   Resistance Training Performed Yes   VAD Patient? No     Pain Assessment   Currently in Pain? No/denies   Multiple Pain Sites No      Capillary Blood Glucose: No results found for this or any previous visit (from the past 24 hour(s)).      Exercise Prescription Changes - 05/11/17 1200      Response to Exercise   Blood Pressure (Admit) 120/76   Blood Pressure (Exercise) 114/60   Blood Pressure (Exit) 108/72   Heart Rate (Admit) 81 bpm   Heart Rate (Exercise) 96 bpm   Heart Rate (Exit) 80 bpm   Oxygen Saturation (Admit) 94 %   Oxygen Saturation (Exercise) 88 %   Oxygen Saturation (Exit) 97 %   Rating of Perceived Exertion (Exercise) 15   Perceived Dyspnea (Exercise) 2   Duration Progress to 45 minutes of aerobic exercise without signs/symptoms of physical distress   Intensity THRR unchanged     Progression   Progression Continue to progress workloads to maintain intensity without signs/symptoms of physical distress.     Resistance Training   Training Prescription Yes   Weight blue bands   Reps 10-15     Oxygen   Oxygen Continuous   Liters 4     NuStep   Level 2   Minutes 34   METs 1.9     Track   Laps 4   Minutes 17      History  Smoking Status  . Former Smoker  . Packs/day: 0.12  . Years: 45.00  . Types: Cigarettes  . Quit date: 11/16/2016  Smokeless Tobacco  . Never Used    Goals Met:  Exercise tolerated well No report of cardiac concerns or symptoms Strength training completed today  Goals Unmet:  Not Applicable  Comments: Service time is from 10:30a to 12:05p    Dr. Rush Farmer is Medical Director for Pulmonary Rehab at [facility].

## 2017-05-13 ENCOUNTER — Encounter (HOSPITAL_COMMUNITY)
Admission: RE | Admit: 2017-05-13 | Discharge: 2017-05-13 | Disposition: A | Discharge status: Home / Self Care | Payer: Medicare Other | Source: Ambulatory Visit | Attending: Pulmonary Disease | Admitting: Pulmonary Disease

## 2017-05-13 VITALS — Wt 220.5 lb

## 2017-05-13 DIAGNOSIS — J438 Other emphysema: Secondary | ICD-10-CM | POA: Diagnosis not present

## 2017-05-13 NOTE — Progress Notes (Signed)
Daily Session Note  Patient Details  Name: Elizabeth [name] MRN: [id number] Date of Birth: 11/26/1955 Referring Provider:     Pulmonary Rehab Walk Test from 05/04/2017 in [place]  Referring Provider  Dr. Vaughan Browner      Encounter Date: 05/13/2017  Check In:     Session Check In - 05/13/17 1124      Check-In   Location [facility]   Staff Present Ramon Dredge, RN, MHA;Lamiyah Schlotter Ysidro Evert, RN;Molly diVincenzo, MS, ACSM RCEP, Exercise Physiologist   Supervising physician immediately available to respond to emergencies [facility] immediately available   Physician(s) Dr. Nada Maclachlan   Medication changes reported     No   Fall or balance concerns reported    No   Tobacco Cessation No Change   Warm-up and Cool-down Performed as group-led instruction   Resistance Training Performed Yes   VAD Patient? No     Pain Assessment   Currently in Pain? No/denies      Capillary Blood Glucose: No results found for this or any previous visit (from the past 24 hour(s)).      Exercise Prescription Changes - 05/13/17 1200      Response to Exercise   Blood Pressure (Admit) 102/64   Blood Pressure (Exercise) 104/74   Blood Pressure (Exit) 92/60   Heart Rate (Admit) 76 bpm   Heart Rate (Exercise) 79 bpm   Heart Rate (Exit) 71 bpm   Oxygen Saturation (Admit) 90 %   Oxygen Saturation (Exercise) 83 %  O2 was suppose to be on 4L increased and educated pt   Oxygen Saturation (Exit) 90 %   Rating of Perceived Exertion (Exercise) 15   Perceived Dyspnea (Exercise) 1   Duration Progress to 45 minutes of aerobic exercise without signs/symptoms of physical distress   Intensity THRR unchanged     Progression   Progression Continue to progress workloads to maintain intensity without signs/symptoms of physical distress.     Resistance Training   Training Prescription Yes   Weight blue bands   Reps 10-15   Time 10 Minutes     Oxygen   Oxygen Continuous   Liters 4     NuStep   Level 2   Minutes 34   METs 1.9      History  Smoking Status  . Former Smoker  . Packs/day: 0.12  . Years: 45.00  . Types: Cigarettes  . Quit date: 11/16/2016  Smokeless Tobacco  . Never Used    Goals Met:  Exercise tolerated well No report of cardiac concerns or symptoms Strength training completed today  Goals Unmet:  Not Applicable  Comments: Service time is from 1030 to 1205    Dr. Rush Farmer is Medical Director for Pulmonary Rehab at [facility].

## 2017-05-14 ENCOUNTER — Other Ambulatory Visit: Payer: Self-pay | Admitting: Internal Medicine

## 2017-05-14 NOTE — Telephone Encounter (Signed)
Tea, ELIQUIS GOT ORDER FOR ONCE A DAY SHOULD BE 2 TIMES DAILY, PLEASE ADVISE

## 2017-05-17 DIAGNOSIS — H1032 Unspecified acute conjunctivitis, left eye: Secondary | ICD-10-CM | POA: Diagnosis not present

## 2017-05-18 ENCOUNTER — Encounter: Payer: Self-pay | Admitting: Internal Medicine

## 2017-05-18 ENCOUNTER — Encounter (HOSPITAL_COMMUNITY)
Admission: RE | Admit: 2017-05-18 | Discharge: 2017-05-18 | Disposition: A | Discharge status: Home / Self Care | Payer: Medicare Other | Source: Ambulatory Visit | Attending: Pulmonary Disease | Admitting: Pulmonary Disease

## 2017-05-18 VITALS — Wt 219.6 lb

## 2017-05-18 DIAGNOSIS — J449 Chronic obstructive pulmonary disease, unspecified: Secondary | ICD-10-CM | POA: Diagnosis not present

## 2017-05-18 DIAGNOSIS — J438 Other emphysema: Secondary | ICD-10-CM

## 2017-05-18 DIAGNOSIS — R911 Solitary pulmonary nodule: Secondary | ICD-10-CM | POA: Diagnosis not present

## 2017-05-18 MED ORDER — APIXABAN 5 MG PO TABS: 5.0000 mg | ORAL_TABLET | Freq: Two times a day (BID) | ORAL | 4 refills | Status: AC

## 2017-05-18 NOTE — Progress Notes (Signed)
Daily Session Note  Patient Details  Name: Elizabeth [name] MRN: [id number] Date of Birth: [birth year] Referring Provider:     Pulmonary Rehab Walk Test from 05/04/2017 in [place]  Referring Provider  Dr. Vaughan Browner      Encounter Date: 05/18/2017  Check In:     Session Check In - 05/18/17 1030      Check-In   Location [facility]   Staff Present Rosebud Poles, RN, BSN;Molly diVincenzo, MS, ACSM RCEP, Exercise Physiologist;Mckensey Berghuis Ysidro Evert, RN;Portia Rollene Rotunda, RN, BSN   Physician(s) Dr. Nada Maclachlan   Medication changes reported     No   Fall or balance concerns reported    No   Tobacco Cessation No Change   Warm-up and Cool-down Performed as group-led instruction   Resistance Training Performed Yes   VAD Patient? No     Pain Assessment   Currently in Pain? No/denies   Multiple Pain Sites No      Capillary Blood Glucose: No results found for this or any previous visit (from the past 24 hour(s)).      Exercise Prescription Changes - 05/18/17 1200      Response to Exercise   Blood Pressure (Admit) 100/60   Blood Pressure (Exercise) 120/70   Blood Pressure (Exit) 100/64   Heart Rate (Admit) 74 bpm   Heart Rate (Exercise) 95 bpm   Heart Rate (Exit) 81 bpm   Oxygen Saturation (Admit) 99 %   Oxygen Saturation (Exercise) 91 %   Oxygen Saturation (Exit) 96 %   Rating of Perceived Exertion (Exercise) 15   Perceived Dyspnea (Exercise) 3   Duration Progress to 45 minutes of aerobic exercise without signs/symptoms of physical distress   Intensity THRR unchanged     Progression   Progression Continue to progress workloads to maintain intensity without signs/symptoms of physical distress.     Resistance Training   Training Prescription Yes   Weight blue bands   Reps 10-15   Time 10 Minutes     Oxygen   Oxygen Continuous   Liters 3-4     NuStep   Level 2   Minutes 34   METs 1.9     Track   Laps 5   Minutes 17       History  Smoking Status  . Former Smoker  . Packs/day: 0.12  . Years: 45.00  . Types: Cigarettes  . Quit date: 11/16/2016  Smokeless Tobacco  . Never Used    Goals Met:  No report of cardiac concerns or symptoms Strength training completed today  Goals Unmet:  Not Applicable  Comments: Service time is from 1030 to 1210    Dr. Rush Farmer is Medical Director for Pulmonary Rehab at [facility].

## 2017-05-18 NOTE — Telephone Encounter (Signed)
Elizabeth [name] states she had some difficulty when trying to pick up her Eliquis prescription because there was something unclear about whether this was taken twice or once daily at the pharmacy. She says that she was not able to pick up the last prescription that I called in and she has been taking aspirin instead. I called [facility] for clarification and they stated that her most recent eliquis prescription was ordered as once daily Rx and that the last time she had picked it up was in January. I clarified that this should be a twice daily dosing. When I called Elizabeth [name] to find out more details about why the rx hadnt been picked up since January she said she had "bottles" of it left from before and hadn't run out yet.

## 2017-05-20 ENCOUNTER — Encounter (HOSPITAL_COMMUNITY): Payer: Medicare Other

## 2017-05-20 NOTE — Progress Notes (Signed)
Pulmonary Individual Treatment Plan  Patient Details  Name: Elizabeth [name] MRN: [id number] Date of Birth: 1956-05-20 Referring Provider:     Pulmonary Rehab Walk Test from 05/04/2017 in [place]  Referring Provider  Dr. Vaughan Browner      Initial Encounter Date:    Pulmonary Rehab Walk Test from 05/04/2017 in [place]  Date  05/04/17  Referring Provider  Dr. Vaughan Browner      Visit Diagnosis: Other emphysema ([place])  Patient's Home Medications on Admission:   Current Outpatient Prescriptions:  .  albuterol (PROVENTIL HFA;VENTOLIN HFA) 108 (90 Base) MCG/ACT inhaler, Inhale 2 puffs into the lungs every 6 (six) hours as needed for wheezing or shortness of breath., Disp: 1 Inhaler, Rfl: 2 .  albuterol (PROVENTIL HFA;VENTOLIN HFA) 108 (90 Base) MCG/ACT inhaler, Inhale 2 puffs into the lungs every 6 (six) hours as needed for wheezing or shortness of breath., Disp: 1 Inhaler, Rfl: 2 .  amLODipine (NORVASC) 5 MG tablet, TAKE 1 TABLET BY MOUTH EVERY DAY, Disp: 30 tablet, Rfl: 11 .  apixaban (ELIQUIS) 5 MG TABS tablet, Take 1 tablet (5 mg total) by mouth 2 (two) times daily., Disp: 60 tablet, Rfl: 4 .  atorvastatin (LIPITOR) 80 MG tablet, Take 80 mg by mouth daily., Disp: , Rfl: 3 .  benzonatate (TESSALON PERLES) 100 MG capsule, Take 1 capsule (100 mg total) by mouth 3 (three) times daily as needed for cough., Disp: 30 capsule, Rfl: 0 .  carvedilol (COREG) 12.5 MG tablet, TAKE 1 TABLET(12.5 MG) BY MOUTH TWICE DAILY, Disp: 180 tablet, Rfl: 3 .  DULoxetine (CYMBALTA) 30 MG capsule, TAKE ONE CAPSULE BY MOUTH DAILY, Disp: 90 capsule, Rfl: 0 .  Fluticasone-Umeclidin-Vilant (TRELEGY ELLIPTA) 100-62.5-25 MCG/INH AEPB, Inhale 1 puff into the lungs daily., Disp: 1 each, Rfl: 5 .  furosemide (LASIX) 40 MG tablet, Take 2 tablets (80 mg total) by mouth every morning and take 1 tablet (40 mg total) by mouth every evening., Disp: 270 tablet, Rfl: 3 .   pantoprazole (PROTONIX) 40 MG tablet, TAKE 1 TABLET(40 MG) BY MOUTH DAILY, Disp: 90 tablet, Rfl: 3 .  potassium chloride SA (K-DUR,KLOR-CON) 20 MEQ tablet, Take 1 tablet (20 mEq total) by mouth daily., Disp: 90 tablet, Rfl: 3 .  valsartan (DIOVAN) 320 MG tablet, Take 320 mg by mouth daily., Disp: , Rfl: 11 .  Vitamin D, Ergocalciferol, (DRISDOL) 50000 units CAPS capsule, Take 1 capsule (50,000 Units total) by mouth every 7 (seven) days., Disp: 8 capsule, Rfl: 0  Past Medical History: Past Medical History:  Diagnosis Date  . CAD (coronary artery disease)    NSTEMI 08/2011:  LHC 08/21/11: mLAD 60-70%, pCFX occluded, dRCA chronic occlusion with L-R collats, EF 40-45%, inf AK.  PCI:  BMS to CFX.  Mar[facility] Kitchen COPD (chronic obstructive pulmonary disease) ([place]) 11/17/2016  . Depression   . Fibroids   . Fibromyalgia   . GERD (gastroesophageal reflux disease)   . Gout   . HLD (hyperlipidemia)    Chol = 235, LDL = 156 (08/2010)  . Hypertension   . Lupus 2009   ANA + 10/2008, repeat ANA + (05/2009), on that visit 05/2009 following labs obtained RF <20, CRP <0.4, ANA titers 1:80,   . On home oxygen therapy    "2L; 24/7 as of today" (11/24/2016)  . Pulmonary nodules 05/2008   noted on CXR and CT 05/2008, repeat CT 10/2008 - Stable small bilateral pulmonary nodules measuring up to 6 m m  .  Stroke [facility]) 04/2014   "mini stroke"    Tobacco Use: History  Smoking Status  . Former Smoker  . Packs/day: 0.12  . Years: 45.00  . Types: Cigarettes  . Quit date: 11/16/2016  Smokeless Tobacco  . Never Used    Labs: Recent Review Flowsheet Data    Labs for ITP Cardiac and Pulmonary Rehab Latest Ref Rng & Units 06/09/2016 06/10/2016 08/18/2016 11/24/2016 03/09/2017   Cholestrol <200 mg/dL - - 163 - 145   LDLCALC <100 mg/dL - - 100 - 72   LDLDIRECT mg/dL - - - - -   HDL >50 mg/dL - - 41(L) - 52   Trlycerides <150 mg/dL - - 108 - 104   Hemoglobin A1c 4.8 - 5.6 % - - - - -   PHART 7.350 - 7.450 - 7.447 - 7.437 -    PCO2ART 32.0 - 48.0 mmHg - 29.6(L) - 37.7 -   HCO3 20.0 - 28.0 mmol/L - 20.5 - 25.0 -   TCO2 0 - 100 mmol/L 21 21 - - -   ACIDBASEDEF 0.0 - 2.0 mmol/L - 3.0(H) - - -   O2SAT % - 92.0 - 94.0 -      Capillary Blood Glucose: Lab Results  Component Value Date   GLUCAP 89 06/16/2016   GLUCAP 105 (H) 06/15/2016   GLUCAP 90 06/15/2016   GLUCAP 99 06/15/2016   GLUCAP 124 (H) 06/15/2016     ADL UCSD:     Pulmonary Assessment Scores    Row Name 05/18/17 1542         ADL UCSD   ADL Phase Entry     SOB Score total 55       CAT Score   CAT Score 32  Entry        Pulmonary Function Assessment:     Pulmonary Function Assessment - 05/03/17 1249      Breath   Bilateral Breath Sounds Decreased;Clear   Shortness of Breath Yes;Panic with Shortness of Breath      Exercise Target Goals:    Exercise Program Goal: Individual exercise prescription set with THRR, safety & activity barriers. Participant demonstrates ability to understand and report RPE using BORG scale, to self-measure pulse accurately, and to acknowledge the importance of the exercise prescription.  Exercise Prescription Goal: Starting with aerobic activity 30 plus minutes a day, 3 days per week for initial exercise prescription. Provide home exercise prescription and guidelines that participant acknowledges understanding prior to discharge.  Activity Barriers & Risk Stratification:   6 Minute Walk:     6 Minute Walk    Row Name 05/04/17 1554         6 Minute Walk   Phase Initial     Distance 700 feet     Walk Time -  4 minutes 30seconds     # of Rest Breaks 4  90seconds total     MPH 1.32     METS 2     RPE 15     Perceived Dyspnea  3     Symptoms Yes (comment)     Comments used wheelchair     Resting HR 82 bpm     Resting BP 130/84     Max Ex. HR 103 bpm     Max Ex. BP 138/80       Interval HR   Baseline HR 82     1 Minute HR 97     2 Minute HR 102  3 Minute HR 103     4 Minute HR  98     5 Minute HR 103     6 Minute HR 101     2 Minute Post HR 85     Interval Heart Rate? Yes       Interval Oxygen   Interval Oxygen? Yes     Baseline Oxygen Saturation % 91 %     Baseline Liters of Oxygen 2 L     1 Minute Oxygen Saturation % 88 %     1 Minute Liters of Oxygen 2 L     2 Minute Oxygen Saturation % 82 %     2 Minute Liters of Oxygen 2 L     3 Minute Oxygen Saturation % 88 %     3 Minute Liters of Oxygen 4 L     4 Minute Oxygen Saturation % 89 %     4 Minute Liters of Oxygen 4 L     5 Minute Oxygen Saturation % 86 %     5 Minute Liters of Oxygen 4 L     6 Minute Oxygen Saturation % 87 %     6 Minute Liters of Oxygen 4 L     2 Minute Post Oxygen Saturation % 99 %     2 Minute Post Liters of Oxygen 4 L        Oxygen Initial Assessment:     Oxygen Initial Assessment - 05/06/17 0721      Initial 6 min Walk   Oxygen Used Continuous;E-Tanks   Liters per minute 4   Resting Oxygen Saturation  during 6 min walk 91 %  2L   Exercise Oxygen Saturation  during 6 min walk 82 %  2 LITERS     Program Oxygen Prescription   Program Oxygen Prescription Continuous;E-Tanks   Liters per minute 4      Oxygen Re-Evaluation:     Oxygen Re-Evaluation    Row Name 05/18/17 0735             Program Oxygen Prescription   Program Oxygen Prescription Continuous;E-Tanks       Liters per minute 4         Home Oxygen   Home Oxygen Device Home Concentrator;E-Tanks       Sleep Oxygen Prescription Continuous       Liters per minute 2       Home Exercise Oxygen Prescription Continuous       Liters per minute 2       Home at Rest Exercise Oxygen Prescription Continuous       Liters per minute 2       Compliance with Home Oxygen Use Yes         Goals/Expected Outcomes   Short Term Goals To learn and exhibit compliance with exercise, home and travel O2 prescription;To learn and understand importance of monitoring SPO2 with pulse oximeter and demonstrate accurate use of  the pulse oximeter.;To Learn and understand importance of maintaining oxygen saturations>88%;To learn and demonstrate proper purse lipped breathing techniques or other breathing techniques.;To learn and demonstrate proper use of respiratory medications       Long  Term Goals Exhibits compliance with exercise, home and travel O2 prescription;Verbalizes importance of monitoring SPO2 with pulse oximeter and return demonstration;Maintenance of O2 saturations>88%;Exhibits proper breathing techniques, such as purse lipped breathing or other method taught during program session;Compliance with respiratory medication       Comments patient has  only attended 2 exercise sessions since admission. She is instructed to use 4 liters of oxygen during exercise. she has to be reminded to increase her oxygen from 2 liters to 4 liters with exertion.          Oxygen Discharge (Final Oxygen Re-Evaluation):     Oxygen Re-Evaluation - 05/18/17 0735      Program Oxygen Prescription   Program Oxygen Prescription Continuous;E-Tanks   Liters per minute 4     Home Oxygen   Home Oxygen Device Home Concentrator;E-Tanks   Sleep Oxygen Prescription Continuous   Liters per minute 2   Home Exercise Oxygen Prescription Continuous   Liters per minute 2   Home at Rest Exercise Oxygen Prescription Continuous   Liters per minute 2   Compliance with Home Oxygen Use Yes     Goals/Expected Outcomes   Short Term Goals To learn and exhibit compliance with exercise, home and travel O2 prescription;To learn and understand importance of monitoring SPO2 with pulse oximeter and demonstrate accurate use of the pulse oximeter.;To Learn and understand importance of maintaining oxygen saturations>88%;To learn and demonstrate proper purse lipped breathing techniques or other breathing techniques.;To learn and demonstrate proper use of respiratory medications   Long  Term Goals Exhibits compliance with exercise, home and travel O2  prescription;Verbalizes importance of monitoring SPO2 with pulse oximeter and return demonstration;Maintenance of O2 saturations>88%;Exhibits proper breathing techniques, such as purse lipped breathing or other method taught during program session;Compliance with respiratory medication   Comments patient has only attended 2 exercise sessions since admission. She is instructed to use 4 liters of oxygen during exercise. she has to be reminded to increase her oxygen from 2 liters to 4 liters with exertion.      Initial Exercise Prescription:     Initial Exercise Prescription - 05/04/17 1600      Date of Initial Exercise RX and Referring Provider   Date 05/04/17   Referring Provider Dr. Vaughan Browner     Oxygen   Oxygen Continuous   Liters 4     NuStep   Level 2   Minutes 34   METs 1.5     Track   Laps 8   Minutes 17     Prescription Details   Frequency (times per week) 2   Duration Progress to 45 minutes of aerobic exercise without signs/symptoms of physical distress     Intensity   THRR 40-80% of Max Heartrate 64-129   Ratings of Perceived Exertion 11-13   Perceived Dyspnea 0-4     Progression   Progression Continue progressive overload as per policy without signs/symptoms or physical distress.     Resistance Training   Training Prescription Yes   Weight blue bands   Reps 10-15      Perform Capillary Blood Glucose checks as needed.  Exercise Prescription Changes:     Exercise Prescription Changes    Row Name 05/11/17 1200 05/13/17 1200 05/18/17 1200         Response to Exercise   Blood Pressure (Admit) 120/76 102/64 100/60     Blood Pressure (Exercise) 114/60 104/74 120/70     Blood Pressure (Exit) 108/72 92/60 100/64     Heart Rate (Admit) 81 bpm 76 bpm 74 bpm     Heart Rate (Exercise) 96 bpm 79 bpm 95 bpm     Heart Rate (Exit) 80 bpm 71 bpm 81 bpm     Oxygen Saturation (Admit) 94 % 90 % 99 %  Oxygen Saturation (Exercise) 88 % 83 %  O2 was suppose to be on  4L increased and educated pt 91 %     Oxygen Saturation (Exit) 97 % 90 % 96 %     Rating of Perceived Exertion (Exercise) _0 Perceived Dyspnea (Exercise) _1 Duration Progress to 45 minutes of aerobic exercise without signs/symptoms of physical distress Progress to 45 minutes of aerobic exercise without signs/symptoms of physical distress Progress to 45 minutes of aerobic exercise without signs/symptoms of physical distress     Intensity THRR unchanged THRR unchanged THRR unchanged       Progression   Progression Continue to progress workloads to maintain intensity without signs/symptoms of physical distress. Continue to progress workloads to maintain intensity without signs/symptoms of physical distress. Continue to progress workloads to maintain intensity without signs/symptoms of physical distress.       Resistance Training   Training Prescription Yes Yes Yes     Weight blue bands blue bands blue bands     Reps 10-15 10-15 10-15     Time  - 10 Minutes 10 Minutes       Oxygen   Oxygen Continuous Continuous Continuous     Liters 4 4 3-4       NuStep   Level _2 Minutes 34 34 34     METs 1.9 1.9 1.9       Track   Laps 4  - 5     Minutes 17  - 17        Exercise Comments:   Exercise Goals and Review:   Exercise Goals Re-Evaluation :     Exercise Goals Re-Evaluation    Row Name 05/18/17 0905             Exercise Goal Re-Evaluation   Exercise Goals Review Increase Physical Activity;Increase Strenth and Stamina       Comments Patient has only attended two exercise sessions. Will cont. to monitor and progress as able.        Expected Outcomes Through exercising at rehab and at home, patient will increase physical capacity and increase stength and stamina.           Discharge Exercise Prescription (Final Exercise Prescription Changes):     Exercise Prescription Changes - 05/18/17 1200      Response to Exercise   Blood Pressure (Admit) 100/60    Blood Pressure (Exercise) 120/70   Blood Pressure (Exit) 100/64   Heart Rate (Admit) 74 bpm   Heart Rate (Exercise) 95 bpm   Heart Rate (Exit) 81 bpm   Oxygen Saturation (Admit) 99 %   Oxygen Saturation (Exercise) 91 %   Oxygen Saturation (Exit) 96 %   Rating of Perceived Exertion (Exercise) 15   Perceived Dyspnea (Exercise) 3   Duration Progress to 45 minutes of aerobic exercise without signs/symptoms of physical distress   Intensity THRR unchanged     Progression   Progression Continue to progress workloads to maintain intensity without signs/symptoms of physical distress.     Resistance Training   Training Prescription Yes   Weight blue bands   Reps 10-15   Time 10 Minutes     Oxygen   Oxygen Continuous   Liters 3-4     NuStep   Level 2   Minutes 34   METs 1.9     Track   Laps 5   Minutes 17  Nutrition:  Target Goals: Understanding of nutrition guidelines, daily intake of sodium <1543m, cholesterol <203m calories 30% from fat and 7% or less from saturated fats, daily to have 5 or more servings of fruits and vegetables.  Biometrics:     Pre Biometrics - 05/03/17 1255      Pre Biometrics   Grip Strength 25 kg       Nutrition Therapy Plan and Nutrition Goals:   Nutrition Discharge: Rate Your Plate Scores:   Nutrition Goals Re-Evaluation:   Nutrition Goals Discharge (Final Nutrition Goals Re-Evaluation):   Psychosocial: Target Goals: Acknowledge presence or absence of significant depression and/or stress, maximize coping skills, provide positive support system. Participant is able to verbalize types and ability to use techniques and skills needed for reducing stress and depression.  Initial Review & Psychosocial Screening:     Initial Psych Review & Screening - 05/03/17 1258      Initial Review   Current issues with History of Depression  history of depression, but refuses treatment     Family Dynamics   Good Support System? Yes      Barriers   Psychosocial barriers to participate in program There are no identifiable barriers or psychosocial needs.      Quality of Life Scores:   PHQ-9: Recent Review Flowsheet Data    [facility] 05/03/2017 04/27/2017 01/04/2017 11/17/2016 04/29/2016   Decreased Interest 2 1 0 0 2   Down, Depressed, Hopeless 1 1 0 1 2   PHQ - 2 Score 3 2 0 1 4   Altered sleeping 3 3 - - 3   Tired, decreased energy 3 3 - - 3   Change in appetite 2 0 - - 0   Feeling bad or failure about yourself  2 1 - - 1   Trouble concentrating 2 1 - - 0   Moving slowly or fidgety/restless 1 1 - - 0   Suicidal thoughts 0 1 - - 0   PHQ-9 Score 16 12 - - 11   Difficult doing work/chores Somewhat difficult Somewhat difficult - - Somewhat difficult     Interpretation of Total Score  Total Score Depression Severity:  1-4 = Minimal depression, 5-9 = Mild depression, 10-14 = Moderate depression, 15-19 = Moderately severe depression, 20-27 = Severe depression   Psychosocial Evaluation and Intervention:     Psychosocial Evaluation - 05/03/17 1259      Psychosocial Evaluation & Interventions   Interventions Encouraged to exercise with the program and follow exercise prescription   Comments she is hoping pulmonary rehab will imorove her quality of life   Continue Psychosocial Services  Follow up required by staff      Psychosocial Re-Evaluation:     Psychosocial Re-Evaluation    [place] 05/18[place]17 0750             Psychosocial Re-Evaluation   Current issues with History of Depression       Comments will follow up with patient over the next 30 days       Expected Outcomes patient will remain free from psychosocial barriers       Continue Psychosocial Services  Follow up required by staff          Psychosocial Discharge (Final Psychosocial Re-Evaluation):     Psychosocial Re-Evaluation - 05/18/17 0750      Psychosocial Re-Evaluation   Current issues with History of Depression    Comments will follow up with patient over the next 30 days  Expected Outcomes patient will remain free from psychosocial barriers   Continue Psychosocial Services  Follow up required by staff      Education: Education Goals: Education classes will be provided on a weekly basis, covering required topics. Participant will state understanding/return demonstration of topics presented.  Learning Barriers/Preferences:     Learning Barriers/Preferences - 05/03/17 1248      Learning Barriers/Preferences   Learning Preferences Audio;Computer/Internet;Group Instruction;Individual Instruction;Video;Verbal Instruction;Skilled Demonstration;Pictoral      Education Topics: Risk Factor Reduction:  -Group instruction that is supported by a PowerPoint presentation. Instructor discusses the definition of a risk factor, different risk factors for pulmonary disease, and how the heart and lungs work together.     Nutrition for Pulmonary Patient:  -Group instruction provided by PowerPoint slides, verbal discussion, and written materials to support subject matter. The instructor gives an explanation and review of healthy diet recommendations, which includes a discussion on weight management, recommendations for fruit and vegetable consumption, as well as protein, fluid, caffeine, fiber, sodium, sugar, and alcohol. Tips for eating when patients are short of breath are discussed.   Pursed Lip Breathing:  -Group instruction that is supported by demonstration and informational handouts. Instructor discusses the benefits of pursed lip and diaphragmatic breathing and detailed demonstration on how to preform both.     Oxygen Safety:  -Group instruction provided by PowerPoint, verbal discussion, and written material to support subject matter. There is an overview of "What is Oxygen" and "Why do we need it".  Instructor also reviews how to create a safe environment for oxygen use, the importance of using oxygen as  prescribed, and the risks of noncompliance. There is a brief discussion on traveling with oxygen and resources the patient may utilize.   Oxygen Equipment:  -Group instruction provided by [facility] Staff utilizing handouts, written materials, and equipment demonstrations.   Signs and Symptoms:  -Group instruction provided by written material and verbal discussion to support subject matter. Warning signs and symptoms of infection, stroke, and heart attack are reviewed and when to call the physician/911 reinforced. Tips for preventing the spread of infection discussed.   Advanced Directives:  -Group instruction provided by verbal instruction and written material to support subject matter. Instructor reviews Advanced Directive laws and proper instruction for filling out document.   Pulmonary Video:  -Group video education that reviews the importance of medication and oxygen compliance, exercise, good nutrition, pulmonary hygiene, and pursed lip and diaphragmatic breathing for the pulmonary patient.   Exercise for the Pulmonary Patient:  -Group instruction that is supported by a PowerPoint presentation. Instructor discusses benefits of exercise, core components of exercise, frequency, duration, and intensity of an exercise routine, importance of utilizing pulse oximetry during exercise, safety while exercising, and options of places to exercise outside of rehab.     PULMONARY REHAB OTHER RESPIRATORY from 05/13/2017 in [place]  Date  05/13/17  Educator  EP  Instruction Review Code  2- meets goals/outcomes      Pulmonary Medications:  -Verbally interactive group education provided by instructor with focus on inhaled medications and proper administration.   Anatomy and Physiology of the Respiratory System and Intimacy:  -Group instruction provided by PowerPoint, verbal discussion, and written material to support subject matter. Instructor reviews  respiratory cycle and anatomical components of the respiratory system and their functions. Instructor also reviews differences in obstructive and restrictive respiratory diseases with examples of each. Intimacy, Sex, and Sexuality differences are reviewed with a discussion on  how relationships can change when diagnosed with pulmonary disease. Common sexual concerns are reviewed.   MD DAY -A group question and answer session with a medical doctor that allows participants to ask questions that relate to their pulmonary disease state.   OTHER EDUCATION -Group or individual verbal, written, or video instructions that support the educational goals of the pulmonary rehab program.   Knowledge Questionnaire Score:     Knowledge Questionnaire Score - 05/18/17 1542      Knowledge Questionnaire Score   Pre Score 8/13      Core Components/Risk Factors/Patient Goals at Admission:     Personal Goals and Risk Factors at Admission - 05/03/17 1258      Core Components/Risk Factors/Patient Goals on Admission    Weight Management Obesity;Weight Loss   Improve shortness of breath with ADL's Yes   Intervention Provide education, individualized exercise plan and daily activity instruction to help decrease symptoms of SOB with activities of daily living.   Expected Outcomes Short Term: Achieves a reduction of symptoms when performing activities of daily living.   Develop more efficient breathing techniques such as purse lipped breathing and diaphragmatic breathing; and practicing self-pacing with activity Yes   Intervention Provide education, demonstration and support about specific breathing techniuqes utilized for more efficient breathing. Include techniques such as pursed lipped breathing, diaphragmatic breathing and self-pacing activity.   Expected Outcomes Short Term: Participant will be able to demonstrate and use breathing techniques as needed throughout daily activities.   Increase knowledge of  respiratory medications and ability to use respiratory devices properly  Yes   Intervention Provide education and demonstration as needed of appropriate use of medications, inhalers, and oxygen therapy.   Expected Outcomes Short Term: Achieves understanding of medications use. Understands that oxygen is a medication prescribed by physician. Demonstrates appropriate use of inhaler and oxygen therapy.      Core Components/Risk Factors/Patient Goals Review:      Goals and Risk Factor Review    Row Name 05/18/17 0743             Core Components/Risk Factors/Patient Goals Review   Personal Goals Review Improve shortness of breath with ADL's;Develop more efficient breathing techniques such as purse lipped breathing and diaphragmatic breathing and practicing self-pacing with activity.;Increase knowledge of respiratory medications and ability to use respiratory devices properly.;Weight Management/Obesity       Review patient has only attended 2 sessions since admission. will monitor closely and re-evaluate goal progression over the next 30 days          Core Components/Risk Factors/Patient Goals at Discharge (Final Review):      Goals and Risk Factor Review - 05/18/17 0743      Core Components/Risk Factors/Patient Goals Review   Personal Goals Review Improve shortness of breath with ADL's;Develop more efficient breathing techniques such as purse lipped breathing and diaphragmatic breathing and practicing self-pacing with activity.;Increase knowledge of respiratory medications and ability to use respiratory devices properly.;Weight Management/Obesity   Review patient has only attended 2 sessions since admission. will monitor closely and re-evaluate goal progression over the next 30 days      ITP Comments:   Comments: ITP REVIEW Pt has not been enrolled long enough to evaluate progress toward pulmonary rehab goals. He has completed only 4 sessions. Recommend continued exercise, life style  modification, education, and utilization of breathing techniques to increase stamina and strength and decrease shortness of breath with exertion.

## 2017-05-25 ENCOUNTER — Telehealth (HOSPITAL_COMMUNITY): Payer: Self-pay | Admitting: Internal Medicine

## 2017-05-25 ENCOUNTER — Encounter (HOSPITAL_COMMUNITY): Payer: Medicare Other

## 2017-05-27 ENCOUNTER — Encounter (HOSPITAL_COMMUNITY): Payer: Medicare Other

## 2017-05-28 NOTE — Progress Notes (Signed)
Elizabeth [name] 61 y.o. female             Nutrition Screen Emphysema  Past Medical History:  Diagnosis Date  . CAD (coronary artery disease)    NSTEMI 08/2011:  LHC 08/21/11: mLAD 60-70%, pCFX occluded, dRCA chronic occlusion with L-R collats, EF 40-45%, inf AK.  PCI:  BMS to CFX.  Marland Kitchen COPD (chronic obstructive pulmonary disease) ([place]) 11/17/2016  . Depression   . Fibroids   . Fibromyalgia   . GERD (gastroesophageal reflux disease)   . Gout   . HLD (hyperlipidemia)    Chol = 235, LDL = 156 (08/2010)  . Hypertension   . Lupus 2009   ANA + 10/2008, repeat ANA + (05/2009), on that visit 05/2009 following labs obtained RF <20, CRP <0.4, ANA titers 1:80,   . On home oxygen therapy    "2L; 24/7 as of today" (11/24/2016)  . Pulmonary nodules 05/2008   noted on CXR and CT 05/2008, repeat CT 10/2008 - Stable small bilateral pulmonary nodules measuring up to 6 m m  . Stroke [facility]) 04/2014   "mini stroke"   Meds reviewed.  Ht: Ht Readings from Last 1 Encounters:  04/19/17 _0  (1.575 m)     Wt:   Wt Readings from Last 3 Encounters:  05/18/17 219 lb 9.3 oz (99.6 kg)  05/13/17 220 lb 7.4 oz (100 kg)  05/11/17 217 lb 6 oz (98.6 kg)     BMI: 40.2    Current tobacco use? No     Pt recently quit tobacco use 11/16/2016  Labs:  Lipid Panel     Component Value Date/Time   CHOL 145 03/09/2017 1148   CHOL 236 (H) 08/19/2015 0941   TRIG 104 03/09/2017 1148   HDL 52 03/09/2017 1148   HDL 49 08/19/2015 0941   CHOLHDL 2.8 03/09/2017 1148   VLDL 21 03/09/2017 1148   LDLCALC 72 03/09/2017 1148   LDLCALC 159 (H) 08/19/2015 0941   LDLDIRECT 145.2 10/29/2011 1618    Lab Results  Component Value Date   HGBA1C 6.2 (H) 06/07/2016    Nutrition Diagnosis ? Food-and nutrition-related knowledge deficit related to lack of exposure to information as related to diagnosis of pulmonary disease ? Obesity related to excessive energy intake as evidenced by a BMI of 40.2  Goal(s) 1. Identify  food quantities necessary to achieve wt loss of  -2# per week to a goal wt loss of 2.7-10.9 kg (6-24 lb) at graduation from pulmonary rehab. 2. Pt to be able to identify foods that affect blood glucose.  Plan:  Pt to attend Pulmonary Nutrition class Will provide client-centered nutrition education as part of interdisciplinary care.   Monitor and evaluate progress toward nutrition goal with team.  Monitor and Evaluate progress toward nutrition goal with team.   Derek Mound, M.Ed, RD, LDN, CDE 05/28/2017 2:18 PM

## 2017-05-31 ENCOUNTER — Emergency Department (HOSPITAL_COMMUNITY): Payer: Medicare Other

## 2017-05-31 ENCOUNTER — Inpatient Hospital Stay (HOSPITAL_COMMUNITY)
Admission: EM | Admit: 2017-05-31 | Discharge: 2017-06-04 | DRG: 291 | Disposition: A | Discharge status: Home / Self Care | Payer: Medicare Other | Attending: Internal Medicine | Admitting: Internal Medicine

## 2017-05-31 ENCOUNTER — Inpatient Hospital Stay (HOSPITAL_COMMUNITY): Payer: Medicare Other

## 2017-05-31 ENCOUNTER — Encounter (HOSPITAL_COMMUNITY): Payer: Self-pay | Admitting: Emergency Medicine

## 2017-05-31 DIAGNOSIS — I2721 Secondary pulmonary arterial hypertension: Secondary | ICD-10-CM | POA: Diagnosis not present

## 2017-05-31 DIAGNOSIS — I509 Heart failure, unspecified: Secondary | ICD-10-CM

## 2017-05-31 DIAGNOSIS — J438 Other emphysema: Secondary | ICD-10-CM | POA: Diagnosis not present

## 2017-05-31 DIAGNOSIS — J9601 Acute respiratory failure with hypoxia: Secondary | ICD-10-CM | POA: Diagnosis present

## 2017-05-31 DIAGNOSIS — E669 Obesity, unspecified: Secondary | ICD-10-CM | POA: Diagnosis present

## 2017-05-31 DIAGNOSIS — Z951 Presence of aortocoronary bypass graft: Secondary | ICD-10-CM | POA: Diagnosis not present

## 2017-05-31 DIAGNOSIS — F329 Major depressive disorder, single episode, unspecified: Secondary | ICD-10-CM | POA: Diagnosis present

## 2017-05-31 DIAGNOSIS — Z8249 Family history of ischemic heart disease and other diseases of the circulatory system: Secondary | ICD-10-CM

## 2017-05-31 DIAGNOSIS — J432 Centrilobular emphysema: Secondary | ICD-10-CM | POA: Diagnosis not present

## 2017-05-31 DIAGNOSIS — Z7901 Long term (current) use of anticoagulants: Secondary | ICD-10-CM | POA: Diagnosis not present

## 2017-05-31 DIAGNOSIS — R0602 Shortness of breath: Secondary | ICD-10-CM

## 2017-05-31 DIAGNOSIS — I2510 Atherosclerotic heart disease of native coronary artery without angina pectoris: Secondary | ICD-10-CM | POA: Diagnosis present

## 2017-05-31 DIAGNOSIS — I700 Atherosclerosis of aorta: Secondary | ICD-10-CM | POA: Diagnosis present

## 2017-05-31 DIAGNOSIS — I5042 Chronic combined systolic (congestive) and diastolic (congestive) heart failure: Secondary | ICD-10-CM

## 2017-05-31 DIAGNOSIS — E785 Hyperlipidemia, unspecified: Secondary | ICD-10-CM | POA: Diagnosis not present

## 2017-05-31 DIAGNOSIS — G4733 Obstructive sleep apnea (adult) (pediatric): Secondary | ICD-10-CM | POA: Diagnosis present

## 2017-05-31 DIAGNOSIS — E559 Vitamin D deficiency, unspecified: Secondary | ICD-10-CM | POA: Diagnosis present

## 2017-05-31 DIAGNOSIS — I255 Ischemic cardiomyopathy: Secondary | ICD-10-CM | POA: Diagnosis not present

## 2017-05-31 DIAGNOSIS — R911 Solitary pulmonary nodule: Secondary | ICD-10-CM | POA: Diagnosis not present

## 2017-05-31 DIAGNOSIS — K219 Gastro-esophageal reflux disease without esophagitis: Secondary | ICD-10-CM | POA: Diagnosis not present

## 2017-05-31 DIAGNOSIS — Z8673 Personal history of transient ischemic attack (TIA), and cerebral infarction without residual deficits: Secondary | ICD-10-CM | POA: Diagnosis not present

## 2017-05-31 DIAGNOSIS — I110 Hypertensive heart disease with heart failure: Secondary | ICD-10-CM | POA: Diagnosis not present

## 2017-05-31 DIAGNOSIS — I480 Paroxysmal atrial fibrillation: Secondary | ICD-10-CM | POA: Diagnosis present

## 2017-05-31 DIAGNOSIS — E876 Hypokalemia: Secondary | ICD-10-CM

## 2017-05-31 DIAGNOSIS — R079 Chest pain, unspecified: Secondary | ICD-10-CM | POA: Diagnosis not present

## 2017-05-31 DIAGNOSIS — M797 Fibromyalgia: Secondary | ICD-10-CM | POA: Diagnosis present

## 2017-05-31 DIAGNOSIS — M109 Gout, unspecified: Secondary | ICD-10-CM | POA: Diagnosis present

## 2017-05-31 DIAGNOSIS — I5033 Acute on chronic diastolic (congestive) heart failure: Secondary | ICD-10-CM | POA: Diagnosis present

## 2017-05-31 DIAGNOSIS — Z6841 Body Mass Index (BMI) 40.0 and over, adult: Secondary | ICD-10-CM

## 2017-05-31 DIAGNOSIS — M329 Systemic lupus erythematosus, unspecified: Secondary | ICD-10-CM | POA: Diagnosis present

## 2017-05-31 DIAGNOSIS — Z7951 Long term (current) use of inhaled steroids: Secondary | ICD-10-CM

## 2017-05-31 DIAGNOSIS — R05 Cough: Secondary | ICD-10-CM | POA: Diagnosis not present

## 2017-05-31 DIAGNOSIS — Z9981 Dependence on supplemental oxygen: Secondary | ICD-10-CM

## 2017-05-31 DIAGNOSIS — Z955 Presence of coronary angioplasty implant and graft: Secondary | ICD-10-CM

## 2017-05-31 DIAGNOSIS — Z87891 Personal history of nicotine dependence: Secondary | ICD-10-CM | POA: Diagnosis not present

## 2017-05-31 DIAGNOSIS — J810 Acute pulmonary edema: Secondary | ICD-10-CM

## 2017-05-31 DIAGNOSIS — Z8042 Family history of malignant neoplasm of prostate: Secondary | ICD-10-CM

## 2017-05-31 DIAGNOSIS — Z8049 Family history of malignant neoplasm of other genital organs: Secondary | ICD-10-CM

## 2017-05-31 DIAGNOSIS — L932 Other local lupus erythematosus: Secondary | ICD-10-CM | POA: Diagnosis not present

## 2017-05-31 DIAGNOSIS — I252 Old myocardial infarction: Secondary | ICD-10-CM

## 2017-05-31 DIAGNOSIS — Z90710 Acquired absence of both cervix and uterus: Secondary | ICD-10-CM

## 2017-05-31 HISTORY — DX: Heart failure, unspecified: I50.9

## 2017-05-31 LAB — BRAIN NATRIURETIC PEPTIDE: B NATRIURETIC PEPTIDE 5: 602.4 pg/mL — AB (ref 0.0–100.0)

## 2017-05-31 LAB — BASIC METABOLIC PANEL
ANION GAP: 11 (ref 5–15)
BUN: 13 mg/dL (ref 6–20)
CALCIUM: 9 mg/dL (ref 8.9–10.3)
CO2: 23 mmol/L (ref 22–32)
Chloride: 104 mmol/L (ref 101–111)
Creatinine, Ser: 0.85 mg/dL (ref 0.44–1.00)
GFR calc Af Amer: >60 mL/min (ref >60)
Glucose, Bld: 110 mg/dL — ABNORMAL HIGH (ref 65–99)
POTASSIUM: 3.5 mmol/L (ref 3.5–5.1)
SODIUM: 138 mmol/L (ref 135–145)

## 2017-05-31 LAB — I-STAT VENOUS BLOOD GAS, ED
ACID-BASE EXCESS: 1 mmol/L (ref 0.0–2.0)
BICARBONATE: 23.8 mmol/L (ref 20.0–28.0)
O2 Saturation: 84 %
TCO2: 25 mmol/L (ref 0–100)
pCO2, Ven: 31.3 mmHg — ABNORMAL LOW (ref 44.0–60.0)
pH, Ven: 7.489 — ABNORMAL HIGH (ref 7.250–7.430)
pO2, Ven: 44 mmHg (ref 32.0–45.0)

## 2017-05-31 LAB — CBC
HEMATOCRIT: 36.9 % (ref 36.0–46.0)
HEMOGLOBIN: 11 g/dL — AB (ref 12.0–15.0)
MCH: 25.1 pg — ABNORMAL LOW (ref 26.0–34.0)
MCHC: 29.8 g/dL — ABNORMAL LOW (ref 30.0–36.0)
MCV: 84.2 fL (ref 78.0–100.0)
Platelets: 235 10*3/uL (ref 150–400)
RBC: 4.38 MIL/uL (ref 3.87–5.11)
RDW: 17.7 % — AB (ref 11.5–15.5)
WBC: 7.1 10*3/uL (ref 4.0–10.5)

## 2017-05-31 LAB — I-STAT TROPONIN, ED: Troponin i, poc: 0 ng/mL (ref 0.00–0.08)

## 2017-05-31 LAB — MRSA PCR SCREENING: MRSA BY PCR: NEGATIVE

## 2017-05-31 MED ORDER — VITAMIN D (ERGOCALCIFEROL) 1.25 MG (50000 UNIT) PO CAPS
50000.0000 [IU] | ORAL_CAPSULE | ORAL | Status: DC
  Filled 2017-05-31: qty 1

## 2017-05-31 MED ORDER — UMECLIDINIUM BROMIDE 62.5 MCG/INH IN AEPB
1.0000 | INHALATION_SPRAY | Freq: Every day | RESPIRATORY_TRACT | Status: DC
  Administered 2017-05-31 – 2017-06-02 (×3): 1 via RESPIRATORY_TRACT
  Filled 2017-05-31: qty 7

## 2017-05-31 MED ORDER — PANTOPRAZOLE SODIUM 40 MG PO TBEC
40.0000 mg | DELAYED_RELEASE_TABLET | Freq: Every day | ORAL | Status: DC
  Administered 2017-05-31 – 2017-06-04 (×5): 40 mg via ORAL
  Filled 2017-05-31 (×5): qty 1

## 2017-05-31 MED ORDER — ONDANSETRON HCL 4 MG/2ML IJ SOLN
4.0000 mg | Freq: Once | INTRAMUSCULAR | Status: AC
  Administered 2017-05-31: 4 mg via INTRAVENOUS
  Filled 2017-05-31: qty 2

## 2017-05-31 MED ORDER — ACETAMINOPHEN 650 MG RE SUPP: 650.0000 mg | Freq: Four times a day (QID) | RECTAL | Status: DC | PRN

## 2017-05-31 MED ORDER — FUROSEMIDE 10 MG/ML IJ SOLN
40.0000 mg | Freq: Once | INTRAMUSCULAR | Status: AC
  Administered 2017-05-31: 40 mg via INTRAVENOUS
  Filled 2017-05-31: qty 4

## 2017-05-31 MED ORDER — IPRATROPIUM-ALBUTEROL 0.5-2.5 (3) MG/3ML IN SOLN
3.0000 mL | Freq: Four times a day (QID) | RESPIRATORY_TRACT | Status: DC
  Administered 2017-05-31 (×2): 3 mL via RESPIRATORY_TRACT
  Filled 2017-05-31 (×2): qty 3

## 2017-05-31 MED ORDER — FLUTICASONE-UMECLIDIN-VILANT 100-62.5-25 MCG/INH IN AEPB: 1.0000 | INHALATION_SPRAY | Freq: Every day | RESPIRATORY_TRACT | Status: DC

## 2017-05-31 MED ORDER — CARVEDILOL 12.5 MG PO TABS
12.5000 mg | ORAL_TABLET | Freq: Two times a day (BID) | ORAL | Status: DC
  Administered 2017-05-31 – 2017-06-04 (×8): 12.5 mg via ORAL
  Filled 2017-05-31: qty 2
  Filled 2017-05-31 (×8): qty 1

## 2017-05-31 MED ORDER — DICLOFENAC SODIUM 1 % TD GEL
2.0000 g | Freq: Four times a day (QID) | TRANSDERMAL | Status: DC
  Administered 2017-05-31 – 2017-06-02 (×5): 2 g via TOPICAL
  Filled 2017-05-31: qty 100

## 2017-05-31 MED ORDER — POLYETHYLENE GLYCOL 3350 17 G PO PACK: 17.0000 g | PACK | Freq: Every day | ORAL | Status: DC | PRN

## 2017-05-31 MED ORDER — ATORVASTATIN CALCIUM 80 MG PO TABS
80.0000 mg | ORAL_TABLET | Freq: Every day | ORAL | Status: DC
  Administered 2017-05-31 – 2017-06-04 (×5): 80 mg via ORAL
  Filled 2017-05-31 (×5): qty 1

## 2017-05-31 MED ORDER — DULOXETINE HCL 30 MG PO CPEP
30.0000 mg | ORAL_CAPSULE | Freq: Every day | ORAL | Status: DC
  Administered 2017-05-31 – 2017-06-02 (×3): 30 mg via ORAL
  Filled 2017-05-31 (×5): qty 1

## 2017-05-31 MED ORDER — ACETAMINOPHEN 325 MG PO TABS
650.0000 mg | ORAL_TABLET | Freq: Four times a day (QID) | ORAL | Status: DC | PRN
  Administered 2017-05-31: 650 mg via ORAL
  Filled 2017-05-31: qty 2

## 2017-05-31 MED ORDER — MORPHINE SULFATE (PF) 4 MG/ML IV SOLN
4.0000 mg | Freq: Once | INTRAVENOUS | Status: AC
  Administered 2017-05-31: 4 mg via INTRAVENOUS
  Filled 2017-05-31: qty 1

## 2017-05-31 MED ORDER — FUROSEMIDE 10 MG/ML IJ SOLN
120.0000 mg | Freq: Once | INTRAVENOUS | Status: AC
  Administered 2017-05-31: 120 mg via INTRAVENOUS
  Filled 2017-05-31: qty 12

## 2017-05-31 MED ORDER — FLUTICASONE FUROATE-VILANTEROL 100-25 MCG/INH IN AEPB
1.0000 | INHALATION_SPRAY | Freq: Every day | RESPIRATORY_TRACT | Status: DC
  Administered 2017-05-31 – 2017-06-04 (×5): 1 via RESPIRATORY_TRACT
  Filled 2017-05-31: qty 28

## 2017-05-31 MED ORDER — IOPAMIDOL (ISOVUE-370) INJECTION 76%
INTRAVENOUS | Status: AC
  Administered 2017-05-31: 100 mL
  Filled 2017-05-31: qty 100

## 2017-05-31 MED ORDER — POTASSIUM CHLORIDE CRYS ER 20 MEQ PO TBCR
40.0000 meq | EXTENDED_RELEASE_TABLET | Freq: Every day | ORAL | Status: DC
  Administered 2017-05-31 – 2017-06-01 (×2): 40 meq via ORAL
  Filled 2017-05-31 (×2): qty 2

## 2017-05-31 MED ORDER — APIXABAN 5 MG PO TABS
5.0000 mg | ORAL_TABLET | Freq: Two times a day (BID) | ORAL | Status: DC
  Administered 2017-05-31 – 2017-06-04 (×9): 5 mg via ORAL
  Filled 2017-05-31 (×9): qty 1

## 2017-05-31 MED ORDER — SODIUM CHLORIDE 0.9% FLUSH
3.0000 mL | Freq: Two times a day (BID) | INTRAVENOUS | Status: DC
  Administered 2017-05-31 – 2017-06-04 (×9): 3 mL via INTRAVENOUS

## 2017-05-31 MED ORDER — ALBUTEROL SULFATE (2.5 MG/3ML) 0.083% IN NEBU
2.5000 mg | INHALATION_SOLUTION | RESPIRATORY_TRACT | Status: DC | PRN
  Administered 2017-06-01: 2.5 mg via RESPIRATORY_TRACT
  Filled 2017-05-31: qty 3

## 2017-05-31 NOTE — H&P (Signed)
Date: 05/31/2017               Patient Name:  Elizabeth [name] MRN: [id number]  DOB: 01-28-1956 Age / Sex: 61 y.o., female   PCP: Ledell Noss, MD         Medical Service: Internal Medicine Teaching Service         Attending Physician: Dr. Aldine Contes, MD    First Contact: Dr. Berline Lopes Pager: [phone]  Second Contact: Dr. Marlowe Sax Pager: [phone]       After Hours (After 5p/  First Contact Pager: [phone]  weekends / holidays): Second Contact Pager: [phone]   Chief Complaint: Shortness of breath  History of Present Illness:  This is a 61 y.o. 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  Patient reports symptoms started yesterday and is associated with a sharp, stabbing pain underneath her right breast on her ribcage area.  She denies any trauma to this area.  She reports the sharp pain makes it difficult for her to take a breath and this has exacerbated her difficulty breathing.  Prior to yesterday, she was doing well without specific complaints.  She reports a mild cough without significant sputum production, no fevers, chills, nausea or vomiting.  She has felt a little dizzy and has some LE edema with right greater than left.  She has also experienced orthopnea.  She was been without her Eliquis for about 1 week.  She reports adherence with her Lasix and beta blocker.  She states she is on 16m QAM and 466mQPM of Lasix.  She is unsure of her Coreg dose.  Via EMS and in the ED, her oxygen saturations were in the upper 80s via [place].  She was placed on room air in the triage area and oxygen decreased from 94 to mid 80s.  She was placed on BiPap for respiratory comfort.  CTA chest was obtained due to concern for PE and IMTS was called for admission.  She was also given 12075mf IV Lasix.  Meds:  Current Meds    Medication Sig  . albuterol (PROVENTIL HFA;VENTOLIN HFA) 108 (90 Base) MCG/ACT inhaler Inhale 2 puffs into the lungs every 6 (six) hours as needed for wheezing or shortness of breath.  . aMarland KitchenLODipine (NORVASC) 5 MG tablet TAKE 1 TABLET BY MOUTH EVERY DAY  . apixaban (ELIQUIS) 5 MG TABS tablet Take 1 tablet (5 mg total) by mouth 2 (two) times daily.  . aMarland Kitchenorvastatin (LIPITOR) 80 MG tablet Take 80 mg by mouth daily.  . carvedilol (COREG) 12.5 MG tablet TAKE 1 TABLET(12.5 MG) BY MOUTH TWICE DAILY  . DULoxetine (CYMBALTA) 30 MG capsule TAKE ONE CAPSULE BY MOUTH DAILY  . Fluticasone-Umeclidin-Vilant (TRELEGY ELLIPTA) 100-62.5-25 MCG/INH AEPB Inhale 1 puff into the lungs daily.  . furosemide (LASIX) 40 MG tablet Take 2 tablets (80 mg total) by mouth every morning and take 1 tablet (40 mg total) by mouth every evening.  . pantoprazole (PROTONIX) 40 MG tablet TAKE 1 TABLET(40 MG) BY MOUTH DAILY  . potassium chloride SA (K-DUR,KLOR-CON) 20 MEQ tablet Take 1 tablet (20 mEq total) by mouth daily.  . valsartan (DIOVAN) 320 MG tablet Take 320 mg by mouth daily.  . Vitamin D, Ergocalciferol, (DRISDOL) 50000 units CAPS capsule Take 1 capsule (50,000 Units total) by mouth every 7 (seven) days.  Allergies: Allergies as of 05/31/2017  . (No Known Allergies)   Past Medical History:  Diagnosis Date  . CAD (coronary artery disease)    NSTEMI 08/2011:  LHC 08/21/11: mLAD 60-70%, pCFX occluded, dRCA chronic occlusion with L-R collats, EF 40-45%, inf AK.  PCI:  BMS to CFX.  Marland Kitchen COPD (chronic obstructive pulmonary disease) ([place]) 11/17/2016  . Depression   . Fibroids   . Fibromyalgia   . GERD (gastroesophageal reflux disease)   . Gout   . HLD (hyperlipidemia)    Chol = 235, LDL = 156 (08/2010)  . Hypertension   . Lupus 2009   ANA + 10/2008, repeat ANA + (05/2009), on that visit 05/2009 following labs obtained RF <20, CRP <0.4, ANA titers 1:80,   . On home oxygen therapy    "2L; 24/7 as of today" (11/24/2016)   . Pulmonary nodules 05/2008   noted on CXR and CT 05/2008, repeat CT 10/2008 - Stable small bilateral pulmonary nodules measuring up to 6 m m  . Stroke [facility]) 04/2014   "mini stroke"    Family History: Mother died of MI at age 37, Father died of cirrhosis; Sister died in 62's with ovarian cancer, brother died in 45's with prostate cancer  Social History: Former smoker, ~25pack years, quit in July 2017; drinks one beer a day; former cocaine abuse, last was 3 years ago.   Review of Systems: A complete ROS was negative except as per HPI.   Physical Exam: Blood pressure 126/83, pulse 66, temperature 98 F (36.7 C), temperature source Oral, resp. rate (!) 23, height _0  (1.575 m), weight 219 lb (99.3 kg), last menstrual period 12/30/1994, SpO2 96 %. Physical Exam  Constitutional: She is oriented to person, place, and time.  Pleasant, AA woman, wearing Bipap, sitting up in bed, does not appear in distress.  HENT:  Head: Normocephalic and atraumatic.  Eyes: Conjunctivae and EOM are normal.  Neck: Neck supple.  JVD difficult to assess due to habitus.  Cardiovascular: Normal rate, regular rhythm, normal heart sounds and intact distal pulses.   Pulmonary/Chest: No respiratory distress. She exhibits tenderness.  Bibasilar fine crackles, no significant wheezes, no distress, on Bipap. TTP at her right rib area.  Abdominal: Soft. Bowel sounds are normal. She exhibits no distension. There is no tenderness. There is no rebound.  Musculoskeletal: She exhibits edema.  1+ LE edema.  No significant difference between right and left.  Neurological: She is alert and oriented to person, place, and time. No cranial nerve deficit.  Skin: Skin is warm and dry.  Psychiatric: Mood and affect normal.     EKG: personally reviewed my interpretation is rate 83, sinus rhythm, old lateral T-waves inversions  CXR:  IMPRESSION: Stable cardiomegaly and COPD.  CTA Chest: IMPRESSION: 1. No acute pulmonary  embolism. 2. Worsening moderate cardiomegaly. Findings of RIGHT heart failure with increasing pulmonary edema. No pleural effusion. 3. Enlarged pulmonary trunk suggestive of pulmonary arterial hypertension. 4. Emphysema.  LABS:  Na 138, K 3.5, Cl 104, CO2 23, BUN 13, Creat 0.85, Gluc 110 WBC 7.1, Hgb 11, HCT 37, Plt 235, MCV 84 Troponin 0.00 VBG pH 7.48, pCO2 31, pO2 44 BNP  602  Assessment & Plan by Problem:  Acute respiratory failure with hypoxia Acute CHF COPD Gold B Likely multifactorial given her underlying lung disease and combined CHF.  Her BNP is elevated to 600 with CTA findings suggestive of right heart failure and increasing pulmonary edema.  Fortunately, her study was negative  for PE given she has been off anticoagulation for 1 week.  She is on 111m daily of Lasix (80 Qam and 40 Qpm) and has received 122mx 1 in the ED.  Her most recent Echo was Jan 2018 with EF 50-55%, grade 2 DD, with PA pressure mildly increased at 398m.  Weight on admission is stable at 219 - admit to SDU due to Bipap requirement - will order one more dose of Lasix 38m30m this afternoon.  Follow BMET, evaluate UOP in the morning and adjust dose as necessary - will continue her Coreg as she reports taking this and for rate control - hold ARB due to normal BP in setting of giving diuresis - wean BiPap to home oxygen - supplement potassium - daily weights - strict I/O's - PT/OT - continue Trelegy - Albuterol nebs PRN - Duonebs scheduled - Voltaren for rib pain  CAD s/p CABG - continue statin - continue beta blocker  Paroxysmal atrial fibrillation: Patient with history of PAF non-adherent to Eliquis for past week.  CHA2DS2VASc Score 6 - continue Eliquis 5mg 58m - continue carvedilol 12.5mg B66m HTN: Home meds include: amlodipine, Coreg, and valsartan.  BP is 126/83 on admission. - hold amlodipine - hold valsartan - continue Coreg  GERD: - continue PPI  Depression: - continue  Cymbalta  Diet: Heart healthy IVF: none DVT/PE: Eliquis Code: FULL  Dispo: Admit patient to Inpatient with expected length of stay greater than 2 midnights.  Signed: WallJule Ser/16/2018, 7:49 AM  Pager: [phone]

## 2017-05-31 NOTE — ED Provider Notes (Signed)
[facility] DEPT Provider Note   CSN: [id number] Arrival date & time: 05/31/17  0111   By signing my name below, I, Eunice Blase, attest that this documentation has been prepared under the direction and in the presence of Horton, Barbette Hair, MD. Electronically signed, Eunice Blase, ED Scribe. 05/31/17. 1:56 AM.   History   Chief Complaint Chief Complaint  Patient presents with  . Shortness of Breath  . Chest Pain   The history is provided by the patient and medical records. No language interpreter was used.    Elizabeth [name] is a 61 y.o. female BIB EMS to the Emergency Department concerning sharp L chest pain onset within the past 3-5 hours. 10/10, sharp constant pains described. Associated SOB, cough productive of beige sputum and R leg swelling. She also notes painful deep breathing and states she cannot lay flat. Pt on 2L of oxygen at home; she states she did not have to increase her oxygen use at any point within the past 24 hours. Pt placed on NRB by EMS on scene after measuring 90% SPO2 on RA. Triage states pt's saturation % sat around upper 80's on [place] en route with EMS as they attempted to wean. Pt placed on RA in triage and SPO2 decreased from 94% to mid 80's. H/o COPD, CABG 1 year ago, CAD, stroke and stent placement noted. Pt states she has not taken her eliquis x ~1 week as she has been out; she adds she recently obtained a script that she has yet to fill. Pt on daily medications including furosemide. No h/o clots. No other complaints at this time.   Past Medical History:  Diagnosis Date  . CAD (coronary artery disease)    NSTEMI 08/2011:  LHC 08/21/11: mLAD 60-70%, pCFX occluded, dRCA chronic occlusion with L-R collats, EF 40-45%, inf AK.  PCI:  BMS to CFX.  Marland Kitchen COPD (chronic obstructive pulmonary disease) ([place]) 11/17/2016  . Depression   . Fibroids   . Fibromyalgia   . GERD (gastroesophageal reflux disease)   . Gout   . HLD (hyperlipidemia)    Chol = 235, LDL = 156  (08/2010)  . Hypertension   . Lupus 2009   ANA + 10/2008, repeat ANA + (05/2009), on that visit 05/2009 following labs obtained RF <20, CRP <0.4, ANA titers 1:80,   . On home oxygen therapy    "2L; 24/7 as of today" (11/24/2016)  . Pulmonary nodules 05/2008   noted on CXR and CT 05/2008, repeat CT 10/2008 - Stable small bilateral pulmonary nodules measuring up to 6 m m  . Stroke [facility]) 04/2014   "mini stroke"    Patient Active Problem List   Diagnosis Date Noted  . Lip abrasion 04/29/2017  . Hyperlipidemia 03/09/2017  . COPD (chronic obstructive pulmonary disease) ([place]) 11/17/2016  . Elevated serum alkaline phosphatase level 09/24/2016  . Long term current use of anticoagulant 09/22/2016  . Coronary artery disease involving native coronary artery of native heart without angina pectoris   . Esophageal reflux   . Gout involving toe of right foot 04/29/2016  . Paroxysmal atrial fibrillation ([place]) 04/20/2016  . History of TIA (transient ischemic attack) 04/20/2016  . Chronic combined systolic and diastolic congestive heart failure ([place]) 04/20/2016  . Vitamin D deficiency 03/06/2016  . Tobacco abuse 08/19/2015  . Healthcare maintenance 08/19/2015  . Atherosclerosis of aorta ([place]) 06/14/2015  . Acute on chronic diastolic (congestive) heart failure ([place]) 05/09/2014  . TIA (transient ischemic attack) 05/09/2014  . Prediabetes  12/25/2013  . Arthritis 01/02/2013  . Preventive measure 11/18/2011  . Ischemic cardiomyopathy 10/29/2011  . PULMONARY NODULE 06/14/2008  . Anxiety and depression 12/27/2006  . Dyslipidemia 12/24/2006  . Essential hypertension 12/24/2006  . Coronary atherosclerosis 12/24/2006  . LUPUS 12/24/2006    Past Surgical History:  Procedure Laterality Date  . CARDIAC CATHETERIZATION N/A 06/02/2016   Procedure: Left Heart Cath and Coronary Angiography;  Surgeon: Belva Crome, MD;  Location: [facility];  Service: Cardiovascular;  Laterality: N/A;  . COLONOSCOPY  N/A 07/14/2013   Procedure: COLONOSCOPY;  Surgeon: Beryle Beams, MD;  Location: [facility];  Service: Endoscopy;  Laterality: N/A;  . CORONARY ANGIOPLASTY WITH STENT PLACEMENT    . CORONARY ARTERY BYPASS GRAFT N/A 06/08/2016   Procedure: CORONARY ARTERY BYPASS GRAFTING (CABG) X 4  With Right Greater Saphenous vein endoscopic harvesting;  Surgeon: [name], MD;  Location: [facility];  Service: Open Heart Surgery;  Laterality: N/A;  . FRACTURE SURGERY    . ORIF TIBIA PLATEAU Left 08/22/2013   Procedure: OPEN REDUCTION INTERNAL FIXATION (ORIF) TIBIAL PLATEAU;  Surgeon: Renette Butters, MD;  Location: [facility];  Service: Orthopedics;  Laterality: Left;  . TEE WITHOUT CARDIOVERSION N/A 06/08/2016   Procedure: TRANSESOPHAGEAL ECHOCARDIOGRAM (TEE);  Surgeon: [name], MD;  Location: [facility];  Service: Open Heart Surgery;  Laterality: N/A;  . TUBAL LIGATION    . VAGINAL HYSTERECTOMY  05/2003    OB History    No data available       Home Medications    Prior to Admission medications   Medication Sig Start Date End Date Taking? Authorizing Provider  albuterol (PROVENTIL HFA;VENTOLIN HFA) 108 (90 Base) MCG/ACT inhaler Inhale 2 puffs into the lungs every 6 (six) hours as needed for wheezing or shortness of breath. 04/19/17  Yes Mannam, Praveen, MD  amLODipine (NORVASC) 5 MG tablet TAKE 1 TABLET BY MOUTH EVERY DAY 02/02/17  Yes Ledell Noss, MD  apixaban (ELIQUIS) 5 MG TABS tablet Take 1 tablet (5 mg total) by mouth 2 (two) times daily. 05/18/17  Yes Ledell Noss, MD  atorvastatin (LIPITOR) 80 MG tablet Take 80 mg by mouth daily. 12/10/16  Yes [provider]  carvedilol (COREG) 12.5 MG tablet TAKE 1 TABLET(12.5 MG) BY MOUTH TWICE DAILY 03/07/16  Yes Liberty Handy, MD  DULoxetine (CYMBALTA) 30 MG capsule TAKE ONE CAPSULE BY MOUTH DAILY 05/10/17  Yes Ledell Noss, MD  Fluticasone-Umeclidin-Vilant (TRELEGY ELLIPTA) 100-62.5-25 MCG/INH AEPB Inhale 1 puff into the lungs daily. 03/04/17  Yes Mannam,  Praveen, MD  furosemide (LASIX) 40 MG tablet Take 2 tablets (80 mg total) by mouth every morning and take 1 tablet (40 mg total) by mouth every evening. 03/09/17  Yes Croitoru, Mihai, MD  pantoprazole (PROTONIX) 40 MG tablet TAKE 1 TABLET(40 MG) BY MOUTH DAILY 12/12/16  Yes Ledell Noss, MD  potassium chloride SA (K-DUR,KLOR-CON) 20 MEQ tablet Take 1 tablet (20 mEq total) by mouth daily. 07/27/16  Yes [name], MD  valsartan (DIOVAN) 320 MG tablet Take 320 mg by mouth daily. 10/04/16  Yes [provider]  Vitamin D, Ergocalciferol, (DRISDOL) 50000 units CAPS capsule Take 1 capsule (50,000 Units total) by mouth every 7 (seven) days. 01/04/17  Yes Ledell Noss, MD  albuterol (PROVENTIL HFA;VENTOLIN HFA) 108 (90 Base) MCG/ACT inhaler Inhale 2 puffs into the lungs every 6 (six) hours as needed for wheezing or shortness of breath. Patient not taking: Reported on 05/31/2017 12/08/16   Holley Raring,  MD  benzonatate (TESSALON PERLES) 100 MG capsule Take 1 capsule (100 mg total) by mouth 3 (three) times daily as needed for cough. Patient not taking: Reported on 05/31/2017 04/27/17 04/27/18  Ledell Noss, MD    Family History Family History  Problem Relation Age of Onset  . Heart disease Mother   . Cervical cancer Sister   . Prostate cancer Brother   . Colon cancer Neg Hx     Social History Social History  Substance Use Topics  . Smoking status: Former Smoker    Packs/day: 0.12    Years: 45.00    Types: Cigarettes    Quit date: 11/16/2016  . Smokeless tobacco: Never Used  . Alcohol use 8.4 oz/week    7 Standard drinks or equivalent, 7 Cans of beer per week     Comment: 1 beer/day     Allergies   Patient has no known allergies.   Review of Systems Review of Systems  Constitutional: Negative for chills, diaphoresis and fever.  Respiratory: Positive for shortness of breath.   Cardiovascular: Positive for chest pain and leg swelling.  Gastrointestinal: Negative for nausea and  vomiting.  All other systems reviewed and are negative.    Physical Exam Updated Vital Signs Temp 98 F (36.7 C) (Oral)   Ht _0  (1.575 m)   Wt 219 lb (99.3 kg)   LMP 12/30/1994   SpO2 100%   BMI 40.06 kg/m   Physical Exam  Constitutional: She is oriented to person, place, and time. She appears well-developed and well-nourished.  Obese  HENT:  Head: Normocephalic and atraumatic.  Cardiovascular: Normal rate, regular rhythm and normal heart sounds.   Pulmonary/Chest: Effort normal. No respiratory distress. She has no wheezes.  Increased respiratory rate, increased work of breathing, splinting to the right side, Rales noted right lower, nasal cannula in place at 4 L  Abdominal: Soft. Bowel sounds are normal. There is no tenderness.  Musculoskeletal:  Trace bilateral lower extremity edema, no asymmetric swelling noted  Neurological: She is alert and oriented to person, place, and time.  Skin: Skin is warm and dry.  Psychiatric: She has a normal mood and affect.  Nursing note and vitals reviewed.    ED Treatments / Results  DIAGNOSTIC STUDIES: Oxygen Saturation is 100% on RA, NL by my interpretation.    COORDINATION OF CARE: 1:42 AM-Discussed next steps with pt. Pt verbalized understanding and is agreeable with the plan.   Labs (all labs ordered are listed, but only abnormal results are displayed) Labs Reviewed  BASIC METABOLIC PANEL - Abnormal; Notable for the following:       Result Value   Glucose, Bld 110 (*)    All other components within normal limits  CBC - Abnormal; Notable for the following:    Hemoglobin 11.0 (*)    MCH 25.1 (*)    MCHC 29.8 (*)    RDW 17.7 (*)    All other components within normal limits  I-STAT VENOUS BLOOD GAS, ED - Abnormal; Notable for the following:    pH, Ven 7.489 (*)    pCO2, Ven 31.3 (*)    All other components within normal limits  BRAIN NATRIURETIC PEPTIDE  I-STAT TROPOININ, ED  I-STAT ARTERIAL BLOOD GAS, ED    EKG   EKG Interpretation  Date/Time:  Monday May 31 2017 01:16:05 EDT Ventricular Rate:  83 PR Interval:    QRS Duration: 98 QT Interval:  434 QTC Calculation: 510 R Axis:   62 Text  Interpretation:  Sinus rhythm Repol abnrm suggests ischemia, anterolateral T wave inversions laterally seen on prior ekg Confirmed by Thayer Jew [phone]) on 05/31/2017 1:19:24 AM       Radiology Dg Chest 2 View  Result Date: 05/31/2017 CLINICAL DATA:  RIGHT chest pain, productive cough. History of heart surgery, COPD. EXAM: CHEST  2 VIEW COMPARISON:  CT chest March 11, 2017 FINDINGS: Cardiac silhouette is mildly enlarged, status post median sternotomy for CABG. Similar bronchitic changes without pleural effusion or focal consolidation. Increased lung volumes. No pneumothorax. Soft tissue planes and included osseous structures are nonsuspicious IMPRESSION: Stable cardiomegaly and COPD. Electronically Signed   By: Elon Alas M.D.   On: 05/31/2017 02:13   Ct Angio Chest Pe W And/or Wo Contrast  Result Date: 05/31/2017 CLINICAL DATA:  RIGHT chest pain, worse with inspiration. Productive cough and hypoxia. History of lupus, COPD, lung nodules. Off Eliquis. EXAM: CT ANGIOGRAPHY CHEST WITH CONTRAST TECHNIQUE: Multidetector CT imaging of the chest was performed using the standard protocol during bolus administration of intravenous contrast. Multiplanar CT image reconstructions and MIPs were obtained to evaluate the vascular anatomy. CONTRAST:  100 cc Isovue 370 COMPARISON:  CT chest March 11, 2017 and chest radiograph May 31, 2017 at 0202 hours FINDINGS: CARDIOVASCULAR: Adequate contrast opacification of the pulmonary artery's. Main pulmonary artery is enlarged at 4 cm. No pulmonary arterial filling defects to the level of the subsegmental branches. The heart is moderately enlarged, no pericardial effusions. Moderate coronary artery calcifications. No pericardial effusion. Thoracic aorta is normal course and caliber,  mild calcific atherosclerosis. Status post CABG. Venous congestion. MEDIASTINUM/NODES: Similar borderline likely reactive mediastinal and hilar and lymph nodes. LUNGS/PLEURA: Tracheobronchial tree is patent, no pneumothorax. Mild bronchial wall thickening. Hazy ground-glass opacities with interlobular septal thickening. Stable pulmonary nodules measuring to 7 mm RIGHT middle lobe, reported as benign on prior CT. Mild centrilobular emphysema better characterized on prior CT. Bibasilar atelectasis. No pleural effusion. UPPER ABDOMEN: Nonsuspicious. Reflux contrast into intrahepatic inferior vena cava seen with RIGHT heart failure. MUSCULOSKELETAL: Visualized soft tissues and included osseous structures are nonacute. Status post median sternotomy. 14 mm LEFT thyroid nodule below size followup recommendation, stable from prior CT. Review of the MIP images confirms the above findings. IMPRESSION: 1. No acute pulmonary embolism. 2. Worsening moderate cardiomegaly. Findings of RIGHT heart failure with increasing pulmonary edema. No pleural effusion. 3. Enlarged pulmonary trunk suggestive of pulmonary arterial hypertension. 4. Emphysema. Electronically Signed   By: Elon Alas M.D.   On: 05/31/2017 04:58    Procedures Procedures (including critical care time)  CRITICAL CARE Performed by: Merryl Hacker   Total critical care time: 50 minutes  Critical care time was exclusive of separately billable procedures and treating other patients.  Critical care was necessary to treat or prevent imminent or life-threatening deterioration.  Critical care was time spent personally by me on the following activities: development of treatment plan with patient and/or surrogate as well as nursing, discussions with consultants, evaluation of patient's response to treatment, examination of patient, obtaining history from patient or surrogate, ordering and performing treatments and interventions, ordering and review of  laboratory studies, ordering and review of radiographic studies, pulse oximetry and re-evaluation of patient's condition.  Angiocath insertion Performed by: Merryl Hacker  Consent: Verbal consent obtained. Risks and benefits: risks, benefits and alternatives were discussed Time out: Immediately prior to procedure a "time out" was called to verify the correct patient, procedure, equipment, support staff and site/side marked as required.  Preparation: Patient was prepped and draped in the usual sterile fashion.  Vein Location: Right basilic  Ultrasound Guided  Gauge: 20  Normal blood return and flush without difficulty Patient tolerance: Patient tolerated the procedure well with no immediate complications.     Medications Ordered in ED Medications  furosemide (LASIX) 120 mg in dextrose 5 % 50 mL IVPB (not administered)  morphine 4 MG/ML injection 4 mg (4 mg Intravenous Given 05/31/17 0243)  ondansetron (ZOFRAN) injection 4 mg (4 mg Intravenous Given 05/31/17 0243)  iopamidol (ISOVUE-370) 76 % injection (100 mLs  Contrast Given 05/31/17 0423)  morphine 4 MG/ML injection 4 mg (4 mg Intravenous Given 05/31/17 0454)     Initial Impression / Assessment and Plan / ED Course  I have reviewed the triage vital signs and the nursing notes.  Pertinent labs & imaging results that were available during my care of the patient were reviewed by me and considered in my medical decision making (see chart for details).     Patient presents with chest pain and shortness of breath. She is hypoxic and has increased work of breathing. She has symmetric lower extremity edema. However, does not appear grossly overloaded. Does report orthopnea. She is afebrile. Increased O2 requirement. Not significantly tachycardic. Chest x-ray does not show any significant pulmonary edema. She's no signs ischemia. Basic labwork obtained. Patient was placed on BiPAP for work of breathing and hypoxia.  Given chest x-ray  findings that do not show any significant pulmonary edema to explain worsening hypoxia, will obtain PE study. Patient has multiple risk factors including lupus. PE study is negative but does show evidence of progressive pulmonary edema on CT. Patient was given 120 mg of IV Lasix. She continues to be comfortable on BiPAP. Will admit to the internal medicine service.    Final Clinical Impressions(s) / ED Diagnoses   Final diagnoses:  Shortness of breath  Acute pulmonary edema (HCC)    New Prescriptions New Prescriptions   No medications on file   I personally performed the services described in this documentation, which was scribed in my presence. The recorded information has been reviewed and is accurate.     Merryl Hacker, MD 05/31/17 [phone]

## 2017-05-31 NOTE — ED Triage Notes (Addendum)
Brought by ems from home with c/o right side chest pain worse with inspiration.  Reports productive cough with beige sputum.  When ems arrived o2 sat reported at 90% on room air.  Placed on nonrebreather.  EMS reports trying to wean off with sat dropping to upper 80"s with [place].  Placed on room air in triage for baseline sat.  Noted to be 94% initially.  Quickly dropped to mid 80's.  Unable to get above 91% with [place] at 3L.   Wears O2 at home at 2L.  Patient reports being on eliquis but had not had it in 4-5 days.

## 2017-05-31 NOTE — ED Notes (Signed)
RT at bedside for ABG

## 2017-05-31 NOTE — Discharge Instructions (Signed)

## 2017-05-31 NOTE — Progress Notes (Signed)
  Echocardiogram 2D Echocardiogram has been performed.  Gwendlyn Hanback T Paulett Kaufhold 05/31/2017, 6:29 PM

## 2017-05-31 NOTE — ED Notes (Signed)
Lunch tray ordered

## 2017-05-31 NOTE — ED Notes (Signed)
CT notified that patient has IV access and is ready for scan.

## 2017-05-31 NOTE — Plan of Care (Signed)
Problem: Safety: Goal: Ability to remain free from injury will improve Outcome: Progressing Patient is aware of the safety policy and is using the call light or the white phone for RN/NT for assistance, will continue to monitor.

## 2017-05-31 NOTE — ED Notes (Signed)
Elizabeth [name] advised she is going to trial patient off of bipap since her respiratory status has improved.

## 2017-05-31 NOTE — ED Notes (Signed)
Admitting at bedside, this RN inquired about patient being downgraded from stepdown to telemetry since she transitioned well off of bipap to nasal cannula, MD advised he would like to keep patient assigned to stepdown in case she needs to go back on bipap.

## 2017-05-31 NOTE — ED Notes (Signed)
On hold for ten minutes to give report to [facility].  Called back and was told the patient would be going to another floor due to staffing.

## 2017-05-31 NOTE — ED Notes (Signed)
Respiratory at bedside

## 2017-05-31 NOTE — ED Notes (Signed)
MD at bedside

## 2017-05-31 NOTE — Progress Notes (Signed)
Pt is on NIV at this time tolerating it well. Pt has some bouts of claustrophobia while on the NIV machine.. Pt states that she has had the flu possibly for a few months she states. Pt states that she wears 2L of Home O2 round the clock at home.

## 2017-05-31 NOTE — ED Notes (Signed)
[name], RRT made aware that patient is going to 3[facility] so that bipap machine can be transported up in case patient needs to go back on it.

## 2017-06-01 ENCOUNTER — Encounter (HOSPITAL_COMMUNITY)
Admission: RE | Admit: 2017-06-01 | Discharge: 2017-06-01 | Disposition: A | Discharge status: Home / Self Care | Payer: Medicare Other | Source: Ambulatory Visit | Attending: Pulmonary Disease | Admitting: Pulmonary Disease

## 2017-06-01 DIAGNOSIS — J438 Other emphysema: Secondary | ICD-10-CM | POA: Diagnosis not present

## 2017-06-01 LAB — ECHOCARDIOGRAM COMPLETE
AVLVOTPG: 3 mmHg
Area-P 1/2: 2.56 cm2
CHL CUP TV REG PEAK VELOCITY: 391 cm/s
E decel time: 292 msec
E/e' ratio: 8.63
FS: 48 % — AB (ref 28–44)
HEIGHTINCHES: 62 in
IV/PV OW: 1.45
LA diam end sys: 34 mm
LA diam index: 1.71 cm/m2
LA vol A4C: 50.2 ml
LASIZE: 34 mm
LAVOL: 58.3 mL
LAVOLIN: 29.3 mL/m2
LV E/e'average: 8.63
LVEEMED: 8.63
LVELAT: 9.9 cm/s
LVOT VTI: 16.1 cm
LVOT area: 3.46 cm2
LVOTD: 21 mm
LVOTPV: 83.3 cm/s
LVOTSV: 56 mL
Lateral S' vel: 5.44 cm/s
MV Dec: 292
MV Peak grad: 3 mmHg
MV pk A vel: 63.5 m/s
MV pk E vel: 85.4 m/s
P 1/2 time: 86 ms
PW: 11 mm — AB (ref 0.6–1.1)
RV sys press: 69 mmHg
TAPSE: 13.9 mm
TDI e' lateral: 9.9
TDI e' medial: 4.35
TRMAXVEL: 391 cm/s
WEIGHTICAEL: 3504 [oz_av]

## 2017-06-01 LAB — COMPREHENSIVE METABOLIC PANEL
ALT: 21 U/L (ref 14–54)
AST: 22 U/L (ref 15–41)
Albumin: 3.6 g/dL (ref 3.5–5.0)
Alkaline Phosphatase: 190 U/L — ABNORMAL HIGH (ref 38–126)
Anion gap: 8 (ref 5–15)
BUN: 16 mg/dL (ref 6–20)
CHLORIDE: 99 mmol/L — AB (ref 101–111)
CO2: 26 mmol/L (ref 22–32)
Calcium: 8.9 mg/dL (ref 8.9–10.3)
Creatinine, Ser: 0.99 mg/dL (ref 0.44–1.00)
GFR calc Af Amer: >60 mL/min (ref >60)
Glucose, Bld: 108 mg/dL — ABNORMAL HIGH (ref 65–99)
POTASSIUM: 3.4 mmol/L — AB (ref 3.5–5.1)
SODIUM: 133 mmol/L — AB (ref 135–145)
Total Bilirubin: 1.1 mg/dL (ref 0.3–1.2)
Total Protein: 7.3 g/dL (ref 6.5–8.1)

## 2017-06-01 MED ORDER — ALBUTEROL SULFATE (2.5 MG/3ML) 0.083% IN NEBU: 2.5000 mg | INHALATION_SOLUTION | Freq: Four times a day (QID) | RESPIRATORY_TRACT | Status: DC

## 2017-06-01 MED ORDER — IBUPROFEN 800 MG PO TABS
800.0000 mg | ORAL_TABLET | Freq: Three times a day (TID) | ORAL | Status: DC | PRN
  Administered 2017-06-01: 800 mg via ORAL
  Filled 2017-06-01: qty 1

## 2017-06-01 MED ORDER — IPRATROPIUM-ALBUTEROL 0.5-2.5 (3) MG/3ML IN SOLN
3.0000 mL | Freq: Four times a day (QID) | RESPIRATORY_TRACT | Status: DC
  Administered 2017-06-01 (×2): 3 mL via RESPIRATORY_TRACT
  Filled 2017-06-01 (×3): qty 3

## 2017-06-01 MED ORDER — FUROSEMIDE 10 MG/ML IJ SOLN
80.0000 mg | Freq: Two times a day (BID) | INTRAMUSCULAR | Status: DC
  Administered 2017-06-01 – 2017-06-02 (×3): 80 mg via INTRAVENOUS
  Filled 2017-06-01 (×3): qty 8

## 2017-06-01 MED ORDER — IPRATROPIUM-ALBUTEROL 0.5-2.5 (3) MG/3ML IN SOLN: 3.0000 mL | Freq: Three times a day (TID) | RESPIRATORY_TRACT | Status: DC

## 2017-06-01 MED ORDER — PREDNISONE 20 MG PO TABS
40.0000 mg | ORAL_TABLET | Freq: Every day | ORAL | Status: DC
  Administered 2017-06-01 – 2017-06-04 (×4): 40 mg via ORAL
  Filled 2017-06-01 (×5): qty 2

## 2017-06-01 NOTE — Evaluation (Signed)
Physical Therapy Evaluation Patient Details Name: Elizabeth [name] MRN: [id number] DOB: Aug 18, 1956 Today's Date: 06/01/2017   History of Present Illness  Pt is a 61 y/o female admitted secondary to acute CHF exacerbation. CT negative for PE. PMH includes fibromyalgia, depression, HTN, CVA, CAD s/p CABG, a fib, CHF, and COPD on 2L of oxygen at home.   Clinical Impression  Pt admitted secondary to problem above with deficits below. PTA, pt reports she was very limited in ambulation distance secondary to SOB, however, was not using AD. Upon eval, pt limited by decreased strength, balance, endurance, and SOB during ambulation. Requiring min guard assist for safety, however, required multiple standing rests secondary to SOB; 3/4 DOE. Oxygen sats decreasing to 88% on 2L at end of ambulation, however, sats increased to >90% on 2L with seated rest and cues for pursed lip breathing. Educated about energy conservation techniques and equipment recommendations, however, pt currently refusing. Given current deficits recommending HHPT at d/c to increase independence and safety with functional mobility. Will continue to follow acutely to ensure independence and safety with functional mobility, as well as, continuing education about energy conservation and activity pacing.     Follow Up Recommendations Home health PT;Supervision for mobility/OOB    Equipment Recommendations  None recommended by PT (has all DME)    Recommendations for Other Services       Precautions / Restrictions Precautions Precautions: Other (comment) Precaution Comments: Watch O2 sats  Restrictions Weight Bearing Restrictions: No      Mobility  Bed Mobility               General bed mobility comments: In chair upon entry   Transfers Overall transfer level: Needs assistance Equipment used: None Transfers: Sit to/from Stand Sit to Stand: Min guard         General transfer comment: Min guard for safety.    Ambulation/Gait Ambulation/Gait assistance: Min guard Ambulation Distance (Feet): 100 Feet Assistive device: None Gait Pattern/deviations: Step-through pattern;Decreased stride length;Wide base of support (slight trendelenberg on RLE ) Gait velocity: Decreased Gait velocity interpretation: Below normal speed for age/gender General Gait Details: Slow, mildly unsteady gait. DOE at 3/4 requiring multiple standing rests. Pt refusing seated rest. Towards end of gait, oxygen sats at 88% on 2L, however, increase to >90% on 2L with seated rest and pursed lip breathing. Educated about using RW for energy conservation and to increase independence and safety with mobility. Pt refusing use currently, and will need further education.   Stairs            Wheelchair Mobility    Modified Rankin (Stroke Patients Only)       Balance Overall balance assessment: Needs assistance Sitting-balance support: No upper extremity supported;Feet supported Sitting balance-Leahy Scale: Good     Standing balance support: No upper extremity supported;During functional activity Standing balance-Leahy Scale: Fair Standing balance comment: static standing                              Pertinent Vitals/Pain Pain Assessment: No/denies pain    Home Living Family/patient expects to be discharged to:: Private residence Living Arrangements: Other relatives Available Help at Discharge: Family;Available 24 hours/day Type of Home: House Home Access: Stairs to enter Entrance Stairs-Rails: None Entrance Stairs-Number of Steps: 1 Home Layout: Two level;Able to live on main level with bedroom/bathroom Home Equipment: Shower seat;Grab bars - tub/shower;Walker - 2 wheels;Cane - single point  Prior Function Level of Independence: Independent         Comments: on 2L of oxygen at baseline      Hand Dominance   Dominant Hand: Right    Extremity/Trunk Assessment   Upper Extremity  Assessment Upper Extremity Assessment: Defer to OT evaluation    Lower Extremity Assessment Lower Extremity Assessment: Generalized weakness (Grossly 3+/5 throughout )    Cervical / Trunk Assessment Cervical / Trunk Assessment: Normal  Communication   Communication: No difficulties  Cognition Arousal/Alertness: Awake/alert Behavior During Therapy: WFL for tasks assessed/performed Overall Cognitive Status: Within Functional Limits for tasks assessed                                        General Comments General comments (skin integrity, edema, etc.): Pt reports she is currently going to cardiac rehab 2 times/week. Educated about balance and strength deficits, and would like HHPT as well. Discussed energy conservation techniques for home such as using shower chair, however, pt currently refusing use, stating "I don't like that thing." Also educated about importance of activity pacing and pursed lip breathing during activity.     Exercises     Assessment/Plan    PT Assessment Patient needs continued PT services  PT Problem List Decreased strength;Decreased activity tolerance;Decreased balance;Decreased mobility;Cardiopulmonary status limiting activity;Decreased knowledge of use of DME;Decreased safety awareness;Decreased knowledge of precautions       PT Treatment Interventions DME instruction;Gait training;Stair training;Functional mobility training;Therapeutic activities;Therapeutic exercise;Balance training;Neuromuscular re-education;Patient/family education    PT Goals (Current goals can be found in the Care Plan section)  Acute Rehab PT Goals Patient Stated Goal: to go home  PT Goal Formulation: With patient Time For Goal Achievement: 06/08/17 Potential to Achieve Goals: Good    Frequency Min 3X/week   Barriers to discharge        Co-evaluation               AM-PAC PT "6 Clicks" Daily Activity  Outcome Measure Difficulty turning over in bed  (including adjusting bedclothes, sheets and blankets)?: A Lot Difficulty moving from lying on back to sitting on the side of the bed? : A Lot Difficulty sitting down on and standing up from a chair with arms (e.g., wheelchair, bedside commode, etc,.)?: Total Help needed moving to and from a bed to chair (including a wheelchair)?: A Little Help needed walking in hospital room?: A Little Help needed climbing 3-5 steps with a railing? : A Lot 6 Click Score: 13    End of Session Equipment Utilized During Treatment: Gait belt;Oxygen Activity Tolerance: Patient tolerated treatment well Patient left: in chair;with call bell/phone within reach Nurse Communication: Mobility status PT Visit Diagnosis: Unsteadiness on feet (R26.81);Muscle weakness (generalized) (M62.81);Other abnormalities of gait and mobility (R26.89)    Time: 7078-6754 PT Time Calculation (min) (ACUTE ONLY): 26 min   Charges:   PT Evaluation $PT Eval Moderate Complexity: 1 Procedure PT Treatments $Gait Training: 8-22 mins   PT G Codes:        Leighton Ruff, PT, DPT  Acute Rehabilitation Services  Pager: [phone]   Rudean Hitt 06/01/2017, 2:04 PM

## 2017-06-01 NOTE — Progress Notes (Signed)
On arrival pt is already on her CPAP machine tolerating it well. Has 4L O2 bled into the machine. SATs are stable at this time.

## 2017-06-01 NOTE — Progress Notes (Signed)
PT Cancellation Note  Patient Details Name: Elizabeth [name] MRN: [id number] DOB: [birth year]   Cancelled Treatment:    Reason Eval/Treat Not Completed: Patient declined, no reason specified Pt reporting she just got done walking with mobility tech and is not feeling good. Would prefer for PT to check back later. Will reattempt as schedule allows.   Leighton Ruff, PT, DPT  Acute Rehabilitation Services  Pager: [phone]    Rudean Hitt 06/01/2017, 11:35 AM

## 2017-06-01 NOTE — Progress Notes (Signed)
Date: 06/01/2017  Patient name: Elizabeth [name]  Medical record number: [id number]  Date of birth: 1956/06/23   I have seen and evaluated Elizabeth [name] and discussed their care with the Residency Team. In brief, patient is a 61 year old female with past medical history of COPD on home O2, CAD status post CABG, A. fib on anticoagulation, chronic combined diastolic and systolic heart failure who presented to the ED with worsening shortness of breath 1 day.  Patient states that 1 day prior to admission she developed sudden onset of sharp stabbing pain over her right breast which made it difficult for her to take a deep breath. She then developed progressively worsening shortness of breath associated with a mild cough with no sputum production. No fevers or chills, no palpitations, no lightheadedness, no syncope, no focal weakness, no nausea or vomiting, diarrhea, no abdominal pain. She did note some mild lower extremity swelling (right greater than left) and some mild orthopnea. She has been off her Eliquis for approximately 1 week. Patient was noted to be hypoxic in the 80s on nasal cannula on admission and had a CTA chest done given concern for possible PE. CTA chest was negative for PE but did show some pulmonary edema and evidence of right heart failure.  Patient states that overnight she developed worsening shortness of breath which improved with a breathing treatment and states that overall she feels better this morning. She does complain of pain in her right great toe consistent with her prior gout attacks.  PMHx, Fam Hx, and/or Soc Hx : As per resident admit note  Vitals:   06/01/17 0755 06/01/17 0857  BP: 107/69 105/73  Pulse: 75   Resp: 18   Temp: 98.1 F (36.7 C)    Gen.: Awake alert and oriented 3, NAD CVS: Regular rate and rhythm, normal heart sounds Lungs: Scattered bilateral end expiratory wheeze noted Abdomen: Soft, nontender, nondistended, normoactive bowel  sounds Extremities: No edema noted, patient does have a swollen right big toe which has mildly increased local warmth  Assessment and Plan: I have seen and evaluated the patient as outlined above. I agree with the formulated Assessment and Plan as detailed in the residents' note, with the following changes:   1. Worsening shortness of breath: - Patient presented to the ED with worsening shortness of breath is found to have a mildly elevated BNP of 600 (close to her baseline) and had a CTA which showed mild pulmonary edema and evidence of right heart failure but no PE. Overnight, patient was noted to have worsening shortness of breath associated with wheezing and hypoxia which improved after a nebulizer treatment. She appears to have some complements of heart failure as well as COPD at this time given her pulmonary edema noted on her chest x-ray and her intermittent wheezing improved with breathing treatments. - We will continue diuresis with IV Lasix 80 mg twice a day today and monitor urine output. Patient was net negative approximately 1 L over the last 24 hours - Continue strict I's and O's and daily weights - Patient was noted to have elevated PA pressures likely secondary to her worsening heart failure. We'll consider repeating her echo once her heart failure symptoms are better controlled - Patient noted to have intermittent wheezing in the setting of a history of COPD with worsening cough but no sputum production consistent with a COPD exacerbation. We'll start prednisone 40 mg daily for five-day course. Continue with her home inhalers and DuoNeb's every 6 hours  scheduled for now - 2-D echo done yesterday showed no significant interval change from prior echo - We'll continue to monitor in stepdown unit today given her episode of wheezing and worsening hypoxia overnight  Aldine Contes, MD 7/17/201810:56 AM

## 2017-06-01 NOTE — Evaluation (Signed)
Occupational Therapy Evaluation Patient Details Name: Elizabeth [name] MRN: [id number] DOB: 09/02/1956 Today's Date: 06/01/2017    History of Present Illness Pt is a 61 y/o female admitted secondary to acute CHF exacerbation. CT negative for PE. PMH includes fibromyalgia, depression, HTN, CVA, CAD s/p CABG, a fib, CHF, and COPD on 2L of oxygen at home.    Clinical Impression   Pt was independent at baseline. Performed entire ADL at a modified independent level. Pt with tendency to rush through activities. Cues to slow pace, take intermittent seated rest breaks and pursed lip breathing. Pt with 02 sats of 89% on 2L upon return to chair. No further OT needs.    Follow Up Recommendations  No OT follow up    Equipment Recommendations  None recommended by OT    Recommendations for Other Services       Precautions / Restrictions Precautions Precautions: Other (comment) Precaution Comments: Watch O2 sats, L knee buckles  Restrictions Weight Bearing Restrictions: No      Mobility Bed Mobility Overal bed mobility: Independent              Transfers Overall transfer level: Needs assistance Equipment used: None Transfers: Sit to/from Stand Sit to Stand: Supervision/modified independent            Balance Overall balance assessment: Needs assistance Sitting-balance support: No upper extremity supported;Feet supported Sitting balance-Leahy Scale: Good     Standing balance support: No upper extremity supported;During functional activity Standing balance-Leahy Scale: Fair Standing balance comment: static standing                            ADL either performed or assessed with clinical judgement   ADL Overall ADL's : Modified independent                                       General ADL Comments: pt completed sponge bathing, dressing and grooming with intermittent seated rest breaks with pursed lip breathing, cues to slow pace      Vision Baseline Vision/History: No visual deficits Patient Visual Report: No change from baseline       Perception     Praxis      Pertinent Vitals/Pain Pain Assessment: No/denies pain     Hand Dominance Right   Extremity/Trunk Assessment Upper Extremity Assessment Upper Extremity Assessment: Overall WFL for tasks assessed   Lower Extremity Assessment Lower Extremity Assessment: Defer to PT evaluation   Cervical / Trunk Assessment Cervical / Trunk Assessment: Normal   Communication Communication Communication: No difficulties   Cognition Arousal/Alertness: Awake/alert Behavior During Therapy: WFL for tasks assessed/performed Overall Cognitive Status: Within Functional Limits for tasks assessed                                     General Comments     Exercises     Shoulder Instructions      Home Living Family/patient expects to be discharged to:: Private residence Living Arrangements: Other relatives Available Help at Discharge: Family;Available 24 hours/day Type of Home: House Home Access: Stairs to enter [employer] of Steps: 1 Entrance Stairs-Rails: None Home Layout: Two level;Able to live on main level with bedroom/bathroom     Bathroom Shower/Tub: Teacher, early years/pre: Standard  Home Equipment: Shower seat;Grab bars - tub/shower;Walker - 2 wheels;Cane - single point          Prior Functioning/Environment Level of Independence: Independent        Comments: on 2L of oxygen at baseline         OT Problem List: Cardiopulmonary status limiting activity;Decreased activity tolerance      OT Treatment/Interventions:      OT Goals(Current goals can be found in the care plan section) Acute Rehab OT Goals Patient Stated Goal: to go home   OT Frequency:     Barriers to D/C:            Co-evaluation              AM-PAC PT "6 Clicks" Daily Activity     Outcome Measure Help from another  person eating meals?: None Help from another person taking care of personal grooming?: None Help from another person toileting, which includes using toliet, bedpan, or urinal?: A Little Help from another person bathing (including washing, rinsing, drying)?: None Help from another person to put on and taking off regular upper body clothing?: None Help from another person to put on and taking off regular lower body clothing?: None 6 Click Score: 23   End of Session Equipment Utilized During Treatment: Oxygen (2L) Nurse Communication: Other (comment) (02 sats)  Activity Tolerance: Patient tolerated treatment well Patient left: in chair;with call bell/phone within reach  OT Visit Diagnosis: Other (comment) (decreased activity tolerance)                Time: 4373-5789 OT Time Calculation (min): 49 min Charges:  OT General Charges $OT Visit: 1 Procedure OT Evaluation $OT Eval Moderate Complexity: 1 Procedure OT Treatments $Self Care/Home Management : 23-37 mins G-Codes:       Malka So 06/01/2017, 4:08 PM  [phone]

## 2017-06-01 NOTE — Progress Notes (Addendum)
Subjective: Patient says she is generally feeling well this morning. She had an episode of dyspnea right before we came in to see her that improved with breathing treatments. She says that she was able to sleep well overnight with the CPAP machine. We spoke with her about her treatment plan including treating for both COPD and HF. She understood and agrees with the plan.  Objective:  Vital signs in last 24 hours: Vitals:   05/31/17 2357 06/01/17 0511 06/01/17 0755 06/01/17 0812  BP: 92/67 102/66 107/69   Pulse: 66 76 75   Resp:   18   Temp: 97.9 F (36.6 C) 98 F (36.7 C) 98.1 F (36.7 C)   TempSrc: Axillary Oral Oral   SpO2: 92% 92% (!) 88% 92%  Weight:  99.8 kg (220 lb)    Height:       Physical Exam  Constitutional: She appears well-developed and well-nourished. No distress.  Cardiovascular: Normal rate, regular rhythm and normal heart sounds.   No murmur heard. 1+ pitting edema LLE.  Pulmonary/Chest: Effort normal. No respiratory distress.  End expiratory wheezing at bilateral bases. On 2L HFNC.   Abdominal: Soft. Bowel sounds are normal. She exhibits no distension. There is no tenderness.  Musculoskeletal:  R MTP swollen w/o erythema, TTP, ROM restriction.  Neurological: She is alert.   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 Patient presented with BNP of 600 (at her baseline) but CTPA with evidence of pulmonary edema, pulmonary arterial HTN and right heart failure. PE ruled out. Echo obtained yesterday and is pending. Per patient history there was no inciting event/URI and this has been a progressive worsening going on for months. Attempted diuresis yesterday with lasix 120 mg IV + 40 mg PO and she put out 1.7 L net  since admission with slight bump in Cr 0.8 -> 0.9. Will continue with diuresis today as she continues to have signs of mild volume overload and will also aim to treat for mild COPD exacerbation as that may be a contributing factor.  - Continue Duoneb 3 mL q6h sch - Lasix 80 mg IV bid today  - Start prednisone 40 mg PO qd today (day 1/5) - Start incentive spirometry today  - Wean oxygen from HFNC to [place] today  - Nursing to perform 6-minute walk oxygen desaturation test today   Left Heart Failure: Followed by [facility], last seen April 2018. Known history of chronic combined systolic (VA=44-58%) and grade 2 diastolic left heart failure where they recently increased her daily diuretic dose up to lasix 80 mg in the morning and 40 in the evening. No known history of RHF per prior echos and CTPE on 07/16 did not comment on RVH or RV dilatation. Increasing lasix today in the setting of radiographic evidence of worsening pulmonary edema compared to prior and symptomatic improvement with diuresis yesterday. Repeated echo yesterday, pending final reading. - F/u 07/16 echo results   - Increase to lasix 80 mg IV bid as above - Continue coreg 12.5 mg po bid  - Hold home diovan 320 mg qd in setting of systolic pressures around 100   COPD exacerbation: Known history of COPD and uses albuterol PRN and Trelegy at home. Recently quit smoking 6 months ago, was  previously a 25 pack-year smoker. Most recent PFTs 06/04/16 showed FEV 62% pred (pre-BD) and 69% pred (post-BD), FEV1/ FVC pred 78% (pre-BD) and 89% (post-BD), TLC 78%, and DLCO 44%. Findings consistent with moderate obstruction, minimal restriction, and severe diffusion defect. - Continue maintenance Breo Ellipta 1 puff qd  - Continue maintenance Incruse Ellipta 1 puff qd  - Continue Duoneb 3 mL q6h sch as above for acute COPD exacerbation  - Start prednisone 40 mg PO qd today (day 1/5) as above for acute COPD exacerbation   Pulmonary HTN: Known history  of elevated pulmonary arterial pressures on prior echos, 45 mmHg in 08/2016 and 39 mmH in 11/2016. Waiting for repeat results from echo 07/16. Likely secondary to her left heart failure. COPD may also be a contributor. CTPE shows dilation of pulmonary trunk to 4 cm. She was previously on amiodarone and may have a history of SLE but does not have evidence of ILD.  - Will treat COPD/LHF as above  CAD: History of quad bypass CABG in 07/17.  - Continue statin 80 mg po  - Continue coreg 12.5 mg po bid as above   Chest Pain: Patient complains of right-sided chest pain underneath the breast worse with palpation of the ribs. Likely MSK-related. Has not had further complaints after treating with voltaren gel. - Continue voltaren gel 1% 2g QID tp  OSA: Last sleep study in 2013 demonstrated very mild disease, was recommended conservative treatment. In the hospital per nursing reports she desats to 70s-80s during sleep. Was on CPAP yesterday night to good effect per patient report. Will need repeat sleep study as outpatient.  - Continue CPAP at night - Will encourage PCP to schedule for sleep study as outpatient   History of SLE: Not on medication and does not have evidence of lung involvement per prior pulmonology note.   HTN: Home meds are coreg, diovan and amlodipine.  - Continue coreg  - Hold diovan and almodipine in setting of soft BP readings   Acute Gout Flare: Patient complaints of acute onset R MTP pain. On exam appears to be a mild flare with some swelling but without significant ROM restriction, erythema or TTP.  - Will start ibuprofen 800 mg PO q8hr prn   Paroxysmal atrial fibrillation: CHA2DS-Vasc score = 5 and needs lifelong anticoagulation per cardiology. Patient was not taking AC for 1 week prior to presentation. - Continue eliquis 5 mg PO bid  - Continue coreg   Dispo: Anticipated discharge in approximately 1 day(s).   Lethea Killings, Medical Student 06/01/2017, 8:33 AM Pager:  [phone]  Attestation for Student Documentation:  I personally was present and performed or re-performed the history, physical exam and medical decision-making activities of this service and have verified that the service and findings are accurately documented in the student's note.  Shela Leff, MD 06/01/2017, 10:34 AM

## 2017-06-02 ENCOUNTER — Other Ambulatory Visit: Payer: Self-pay | Admitting: Internal Medicine

## 2017-06-02 DIAGNOSIS — E876 Hypokalemia: Secondary | ICD-10-CM

## 2017-06-02 DIAGNOSIS — R0602 Shortness of breath: Secondary | ICD-10-CM

## 2017-06-02 LAB — BASIC METABOLIC PANEL
ANION GAP: 8 (ref 5–15)
BUN: 23 mg/dL — ABNORMAL HIGH (ref 6–20)
CO2: 25 mmol/L (ref 22–32)
Calcium: 9.1 mg/dL (ref 8.9–10.3)
Chloride: 102 mmol/L (ref 101–111)
Creatinine, Ser: 1.03 mg/dL — ABNORMAL HIGH (ref 0.44–1.00)
GFR calc Af Amer: >60 mL/min (ref >60)
GFR, EST NON AFRICAN AMERICAN: 58 mL/min — AB (ref >60)
GLUCOSE: 109 mg/dL — AB (ref 65–99)
POTASSIUM: 3.4 mmol/L — AB (ref 3.5–5.1)
SODIUM: 135 mmol/L (ref 135–145)

## 2017-06-02 LAB — HIV ANTIBODY (ROUTINE TESTING W REFLEX): HIV Screen 4th Generation wRfx: NONREACTIVE

## 2017-06-02 MED ORDER — POTASSIUM CHLORIDE CRYS ER 20 MEQ PO TBCR
40.0000 meq | EXTENDED_RELEASE_TABLET | Freq: Two times a day (BID) | ORAL | Status: DC
  Administered 2017-06-02 – 2017-06-04 (×5): 40 meq via ORAL
  Filled 2017-06-02 (×5): qty 2

## 2017-06-02 MED ORDER — IPRATROPIUM-ALBUTEROL 0.5-2.5 (3) MG/3ML IN SOLN
3.0000 mL | Freq: Three times a day (TID) | RESPIRATORY_TRACT | Status: DC
  Administered 2017-06-02 – 2017-06-04 (×7): 3 mL via RESPIRATORY_TRACT
  Filled 2017-06-02 (×7): qty 3

## 2017-06-02 MED ORDER — PREDNISONE 20 MG PO TABS: 40.0000 mg | ORAL_TABLET | Freq: Every day | ORAL | 0 refills | Status: DC

## 2017-06-02 MED ORDER — POTASSIUM CHLORIDE CRYS ER 20 MEQ PO TBCR: 40.0000 meq | EXTENDED_RELEASE_TABLET | Freq: Every day | ORAL | 0 refills | Status: DC

## 2017-06-02 MED ORDER — FUROSEMIDE 10 MG/ML IJ SOLN
40.0000 mg | Freq: Once | INTRAMUSCULAR | Status: AC
  Administered 2017-06-02: 40 mg via INTRAVENOUS
  Filled 2017-06-02: qty 4

## 2017-06-02 NOTE — Progress Notes (Signed)
Subjective: Patient says she is feeling much better today. She is breathing better and is back to using her home oxygen requirement. She was able to perform the exercises well with PT yesterday. We discussed that we will discharge her today and she will have follow-up with cardiology, she understands and agrees.   Objective:  Vital signs in last 24 hours: Vitals:   06/01/17 2309 06/01/17 2351 06/02/17 0348 06/02/17 0757  BP:  104/70 105/74 101/79  Pulse: 76 74 (!) 56 (!) 53  Resp:      Temp:  97.9 F (36.6 C) 98.1 F (36.7 C) 98 F (36.7 C)  TempSrc:  Axillary Axillary Oral  SpO2: 100% 91% 97% 96%  Weight:   100.6 kg (221 lb 11.2 oz)   Height:       Physical Exam  Constitutional: She appears well-developed and well-nourished. No distress.  Cardiovascular: Normal rate, regular rhythm and normal heart sounds.   No murmur heard. Pulmonary/Chest: Effort normal and breath sounds normal. No respiratory distress. She has no wheezes. She has no rales.  Abdominal: Soft. She exhibits no distension. There is no tenderness.  Neurological: She is alert.  Skin: Skin is warm and dry.  Psychiatric: She has a normal mood and affect. Her behavior is normal.   Assessment/Plan:  Active Problems:   Acute exacerbation of CHF (congestive heart failure) (HCC)   CHF exacerbation (HCC)  Acute on Chronic Dyspnea: Likely combination of mild COPD exacerbation and CHF exacerbation with evidence of pulmonary and peripheral edema on presentation. She has improved significantly clinically with 1.6L net output yesterday with further diuresis. Improved pulmonary edema and now has clear lungs. Patient endorses feeling much better this morning.  Rechecking her BMP this morning as she did have a slight bump with prior dose. She is back to her home 2L [place] not high flow. Per PT report yesterday she was satting 88-92% with activity and they recommended further conditioning with HHPT upon discharge. We have also  consulted CM about sending her home with portable oxygen they will follow-up with her oxygen tank company who will have respiratory therapists evaluate her at home.  - F/u BMP from this morning  - Continue Duoneb 3 mL q6h sch - Restart her lasix 80 mg PO qAM + 40 mg PO qn today - Continue prednisone 40 mg PO qd today (day 2/5), will discharge with this medication to finish a full 5 day course  - Continue incentive spirometry today  - Discharge with HHPT   Left Heart Failure: Followed by [facility], last seen April 2018. 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 Will see cardiology again on 07/30. Repeat echo from 07/16 showed no significant changes compared to prior study from January, with grade 2 diastolic dysfunction of LV and severely increased PA peak pressure of 69 mm Hg, likely elevated in setting of acute HF exacerbation.  - Restart home lasix  - Continue coreg 12.5 mg po bid  - Restart home diovan on discharge    COPD exacerbation: Known history of COPD and uses albuterol PRN and Trelegy at home. Recently quit smoking 6 months ago, was previously a 25 pack-year smoker. Most recent PFTs 06/04/16 showed FEV 62% pred (pre-BD) and 69% pred (post-BD), FEV1/ FVC pred 78% (pre-BD) and 89% (post-BD), TLC 78%, and DLCO 44%. Findings consistent  with moderate obstruction, minimal restriction, and severe diffusion defect. - Continue maintenance Breo Ellipta 1 puff qd  - Continue maintenance Incruse Ellipta 1 puff qd  - Continue Duoneb 3 mL q6h sch as above for acute COPD exacerbation  - Continue prednisone 40 mg PO qd today (day 2/5) as above for acute COPD exacerbation   Pulmonary HTN: History of elevated PA pressures, CTPE shows dilation of pulmonary trunk to 4 cm. Likely secondary to her left heart failure and COPD may also be a  contributor. She was previously on amiodarone and may have a history of SLE but does not have evidence of ILD.  - Will treat COPD/LHF as above  CAD: History of quad bypass CABG in 07/17.  - Continue statin 80 mg po  - Continue coreg 12.5 mg po bid as above   Chest Pain: Patient presented complaining of right-sided chest pain underneath the breast worse with palpation of the ribs. Likely MSK-related. Has not had further complaints after treating with voltaren gel. - Continue voltaren gel 1% 2g qid tp  OSA: Last sleep study in 2013 demonstrated very mild disease, was recommended conservative treatment. In the hospital per nursing reports she desats to 70s-80s during sleep. Has been on CPAP to good effect per patient report. Will need repeat sleep study as outpatient.  - Continue CPAP at night - Will encourage PCP to schedule for sleep study as outpatient   History of SLE: Not on medication and does not have evidence of lung involvement per prior pulmonology note.   HTN: Home meds are coreg, diovan and amlodipine.  - Continue coreg  - Hold diovan and almodipine in setting of soft BP readings around 100s  Acute Gout Flare: Patient complaints of acute onset R MTP pain. On exam appears to be a mild flare with some swelling but without significant ROM restriction, erythema or TTP. Received three doses of ibuprofen 800 mg yesterday and patient reports some improvement today. Will discontinue ibuprofen as this should improve with her prednisone course for COPD exacerbation. - Will discontinue ibuprofen 800 mg PO q8hr PRN - Continue prednisone 40 mg PO qd (day 2/5)   Paroxysmal atrial fibrillation: CHA2DS-Vasc score = 5 and needs lifelong anticoagulation per cardiology. Patient was not taking AC for 1 week prior to presentation as she was not able to get transportation to the pharmacy. Talked to the patient and she now has access after discharge.  - Continue eliquis 5 mg PO bid  - Continue  coreg   DVT ppx: Eliquis Dispo: Anticipated discharge today.   Lethea Killings, Medical Student 06/02/2017, 10:06 AM Pager: [phone]  Attestation for Student Documentation:  I personally was present and performed or re-performed the history, physical exam and medical decision-making activities of this service and have verified that the service and findings are accurately documented in the student's note.  Shela Leff, MD 06/02/2017, 11:06 AM

## 2017-06-02 NOTE — Progress Notes (Signed)
Patient refused to use the cpap tonight. She said she is fine with the nasal cannula.

## 2017-06-02 NOTE — Discharge Summary (Signed)
Name: Elizabeth [name] MRN: [id number] DOB: [birth year] 61 y.o. PCP: Elizabeth Noss, MD  Date of Admission: 05/31/2017  1:11 AM Date of Discharge: 06/04/2017 Attending Physician: Aldine Contes, MD  Discharge Diagnosis: 1. Acute CHF Exacerbation 2. CHF 3. COPD 4. CAD 5. OSA 6. HTN 7. Paroxysmal a-fib 8. MSK chest pain   Active Problems:   Acute exacerbation of CHF (congestive heart failure) (HCC)   CHF exacerbation (HCC)   Discharge Medications: Allergies as of 06/04/2017   No Known Allergies     Medication List    STOP taking these medications   amLODipine 5 MG tablet Commonly known as:  NORVASC     TAKE these medications   albuterol 108 (90 Base) MCG/ACT inhaler Commonly known as:  PROVENTIL HFA;VENTOLIN HFA Inhale 2 puffs into the lungs every 6 (six) hours as needed for wheezing or shortness of breath. What changed:  Another medication with the same name was removed. Continue taking this medication, and follow the directions you see here.   apixaban 5 MG Tabs tablet Commonly known as:  ELIQUIS Take 1 tablet (5 mg total) by mouth 2 (two) times daily.   atorvastatin 80 MG tablet Commonly known as:  LIPITOR Take 80 mg by mouth daily.   carvedilol 12.5 MG tablet Commonly known as:  COREG TAKE 1 TABLET(12.5 MG) BY MOUTH TWICE DAILY   DULoxetine 30 MG capsule Commonly known as:  CYMBALTA TAKE ONE CAPSULE BY MOUTH DAILY   Fluticasone-Umeclidin-Vilant 100-62.5-25 MCG/INH Aepb Commonly known as:  TRELEGY ELLIPTA Inhale 1 puff into the lungs daily.   furosemide 40 MG tablet Commonly known as:  LASIX Take 2 tablets (80 mg total) by mouth every morning and take 1 tablet (40 mg total) by mouth every evening.   pantoprazole 40 MG tablet Commonly known as:  PROTONIX TAKE 1 TABLET(40 MG) BY MOUTH DAILY   potassium chloride SA 20 MEQ tablet Commonly known as:  K-DUR,KLOR-CON Take 2 tablets (40 mEq total) by mouth daily. What changed:  how much to take     predniSONE 20 MG tablet Commonly known as:  DELTASONE Take 2 tablets (40 mg total) by mouth daily with breakfast.   valsartan 320 MG tablet Commonly known as:  DIOVAN Take 320 mg by mouth daily.   Vitamin D (Ergocalciferol) 50000 units Caps capsule Commonly known as:  DRISDOL Take 1 capsule (50,000 Units total) by mouth every 7 (seven) days.            Durable Medical Equipment        Start     Ordered   06/04/17 1054  For home use only DME 4 wheeled rolling walker with seat  Once    Question:  Patient needs a walker to treat with the following condition  Answer:  Heart failure ([place])   06/04/17 1053   06/04/17 1052  For home use only DME oxygen  Once    Comments:  Needs evaluation for a portable O2 tank. Needs to use 2L with rest and 4-6L with activity.  Question Answer Comment  Mode or (Route) Nasal cannula   Liters per Minute 2   Frequency Continuous (stationary and portable oxygen unit needed)   Oxygen delivery system Gas      06/04/17 1052      Disposition and follow-up:   [name] was discharged from [facility] in Good condition.  At the hospital follow up visit please address:  1.  Needs to be evaluated for  repeat sleep study. Prior study in 2013 showed mild OSA. Noted to desat at night while not on CPAP, improved symptomatically with CPAP while in hospital. Patient also requested a vitamin D supplement just prior to discharge, please check vitamin D level during her follow-up visit.   2.  Labs / imaging needed at time of follow-up: Repeat BMP for Cr and K, vitamin D level  3.  Pending labs/ test needing follow-up: none  Follow-up Appointments: Follow-up Information    Elizabeth Noss, MD. Go on 06/08/2017.   Specialty:  Internal Medicine Why:  Appointment at 3:45 pm.  Contact information: [place] [address] [phone]        Sanda Klein, MD. Go on 06/14/2017.   Specialty:  Cardiology Why:  Appointment  at 11 am.  Contact information: [address] [place] [address] [phone]        [facility], [facility] Follow up.   Specialty:  Home Health Services Why:  Physical Therapy Contact information: [place] [address] [place] Follow up.   Why:  [facility] Contact information: [address] [place] [address] [phone]           Hospital Course by problem list: Active Problems:   Acute exacerbation of CHF (congestive heart failure) (HCC)   CHF exacerbation (Elizabeth [name])   1. Acute CHF Exacerbation: Patient presented with history of significantly worsening dyspnea at home that had progressed to dyspnea at rest. On presentation BNP was at baseline, however she clinically appeared mildly volume overloaded and CTPE showed new onset pulmonary edema. Initially she was dyspneic at rest despite high flow oxygen use at up to 4L. Additionally, she initially tolerated 6 minute walks poorly with significant dyspnea, lightheadedness, and requirement for frequent stops with total ambulation distance of 75 yards. After initiating IV lasix, she diuresed well and her exam improved significantly by hospital day 2 and 3. She had notable improvement every day in her 6-minute walk test with PT as we kept diuresing her. On the day of discharge, she had significant improvement in her latest 6-minute walk test, with lessened dyspnea and lightheadedness, though she still required oxygen to keep her saturations high.   2. CHF: She was maintained on her home coreg. As above, a CHF exacerbation was the most likely etiology of her worsening dyspnea. A repeat echocardiogram showed that her EF and diastolic dysfunction were stable at 50-55% and grade 2, respectively, compared to a prior echo performed in January. Therefore this was likely an acute exacerbation without evidence of worsening of her baseline disease.   3. COPD:  Patient had a history of known mild COPD with most recent PFTs 06/04/16 showed FEV 62% pred (pre-BD) and 69% pred (post-BD), FEV1/ FVC pred 78% (pre-BD) and 89% (post-BD), TLC 78%, and DLCO 44% consistent with stage 1 COPD. Given her worsening dyspnea, she was maintained on her home inhalers and started on duonebs and a short course of prenidone 40 mg qd x 5 days to treat any underlying COPD exacerbation.   4. CAD: She had a history of obstructed CAD per [facility] in 2017 and had undergone quad CABG in July 2017. She was maintained on her home statin and coreg.   5. OSA: Last sleep study in 2013 demonstrated very mild disease, and patient was recommended conservative treatment at the time. In the hospital per nursing reports she  desats to 70s-80s during sleep. Has been on CPAP to good effect per patient report but purportedly has issues with mask fitting. Will need repeat sleep study as outpatient. Does not have CPAP machine at home.   6. HTN: Her home medications were coreg, diovan and amlodipine. She remained normotensive on coreg and did not need amlodipine and diovan in the hospital. Diovan was resumed and Amlodipine held on discharge. We recommend that her PCP re-evaluate her medication requirement for Amlodipine after discharge.   7. Paroxysmal A-fib: She needs lifelong anticoagulation because of her CHA2DS-Vasc score of 5. Patient had actually not been taking her prescribed eliquis for 1 week prior to admission because of lack of transport to the pharmacy. The patient told us that she now had transportation set up for going to the pharmacy after discharge.  8. MSK Chest Pain: The patient complained of some chest tenderness worse with palpation at the R anterior chest wall underneath her right breast. She was given topical voltaren gel which caused resolution of this pain. She had no further complaints of MSK chest pain during the admission.   Discharge Vitals:   BP 134/77 (BP Location: Right Arm)    Pulse 68   Temp 97.8 F (36.6 C) (Oral)   Resp 16   Ht _0  (1.575 m)   Wt 97.9 kg (215 lb 14.4 oz)   LMP 12/30/1994   SpO2 95%   BMI 39.49 kg/m   Pertinent Labs, Studies, and Procedures:   Lab Results  Component Value Date   CREATININE 1.06 (H) 06/03/2017   BUN 21 (H) 06/03/2017   NA 138 06/03/2017   K 4.6 06/03/2017   CL 105 06/03/2017   CO2 25 06/03/2017   BP Readings from Last 1 Encounters:  06/04/17 134/77   Lab Results  Component Value Date   CKTOTAL 109 05/30/2015   CKMB 72.2 (HH) 08/21/2011   TROPONINI 0.03 (HH) 07/06/2016   Lab Results  Component Value Date   WBC 7.1 05/31/2017   HGB 11.0 (L) 05/31/2017   HCT 36.9 05/31/2017   MCV 84.2 05/31/2017   PLT 235 05/31/2017   Discharge Instructions: Discharge Instructions    Diet - low sodium heart healthy    Complete by:  As directed    Increase activity slowly    Complete by:  As directed      Signed: Lethea Killings, Medical Student 06/04/2017, 12:05 PM   Pager: [phone]  Attestation for Student Documentation:  I personally was present and performed or re-performed the history, physical exam and medical decision-making activities of this service and have verified that the service and findings are accurately documented in the student's note.  Shela Leff, MD 06/04/2017, 2:12 PM

## 2017-06-02 NOTE — Progress Notes (Signed)
Pt desats to 79% on 2L. Pt asymptomatic. Increased oxygen to 4L to maintain sat of 91-93%. Notified Dr. Wandra Feinstein. Will see pt. Cont to monitor. Carroll Kinds RN

## 2017-06-02 NOTE — Progress Notes (Signed)
Patient sitting in recliner, on 2l Per MD, desatted to 79-82.  O2 increased to 3l [place], sat now 88-91

## 2017-06-02 NOTE — Progress Notes (Signed)
Called to bedside to see patient. Plan was to discharge on home 2L [place] today after symptomatic improvement in dyspnea and significant diuresis. Per PT records from this AM patient desat to 78% when exerted with sx of dizziness/weakness on home 2L [place]. With increase to 4L and exertion was around 81% and could not recover to 90s within 5 mins of rest. We saw the patient at bedside, she was satting in the mid 90s on 4L. When decreased to 2L she was 88-92% range for several minutes. We left and approx 15 minutes later were told patient was 78-81% on 2L at rest and was increased to 3L with response to 88-92%. Agreed to keep patient at 3L. Explained to patient that she will not be discharged today pending possible further w/u and treatment.   Attestation for Student Documentation:  I personally was present and performed or re-performed the history, physical exam and medical decision-making activities of this service and have verified that the service and findings are accurately documented in the student's note.  Shela Leff, MD 06/02/2017, 2:26 PM

## 2017-06-02 NOTE — Progress Notes (Signed)
Physical Therapy Treatment Patient Details Name: Elizabeth [name] MRN: [id number] DOB: 04-May-1956 Today's Date: 06/02/2017    History of Present Illness Pt is a 61 y/o female admitted secondary to acute CHF exacerbation. CT negative for PE. PMH includes fibromyalgia, depression, HTN, CVA, CAD s/p CABG, a fib, CHF, and COPD on 2L of oxygen at home.     PT Comments    Pt with O2 desaturation today. Attempted ambulation on home O2 level of 2L and desat to 78% with symptoms of dizziness and weakness. Even with O2 up to 4L, could not recover into 90's in less than 5 mins. Needed w/c back to room. Second bout of walking with 4L O2 resulted in desat to 81% with return to 90's in 3 mins. Do not feel that pt safe with mobility to leave today with symptomatic desat. In addition, feel that she would greatly benefit from use of a rollator with seat for periods of desat and she was hesitant yesterday but agreeable today. PT will continue to follow.    Follow Up Recommendations  Home health PT;Supervision for mobility/OOB     Equipment Recommendations  Other (comment) (rollator with seat)    Recommendations for Other Services       Precautions / Restrictions Precautions Precautions: Other (comment) Precaution Comments: Watch O2 sats, L knee buckles  Restrictions Weight Bearing Restrictions: No    Mobility  Bed Mobility Overal bed mobility: Independent                Transfers Overall transfer level: Needs assistance Equipment used: None Transfers: Sit to/from Stand Sit to Stand: Supervision         General transfer comment: supervision with transfers later in session because pt started to become dizzy. When O2 sats in 90's she is mod I  Ambulation/Gait Ambulation/Gait assistance: Min guard Ambulation Distance (Feet): 120 Feet Assistive device: None Gait Pattern/deviations: Step-through pattern;Decreased stride length;Wide base of support (slight trendelenberg on RLE  ) Gait velocity: Decreased Gait velocity interpretation: Below normal speed for age/gender General Gait Details: Pt ambulated on 2L O2 and felt good for 100', last 20', dropped to 78% requiring stopping and resting with sats still low 80's after 3 mins on 4L and w/c back to room. Pt walked again within room on 4L and sats down to 81% but with faster return to 90's, 3 mins. Pt still resistant to RW but agreeable after longer walk.    Stairs            Wheelchair Mobility    Modified Rankin (Stroke Patients Only)       Balance Overall balance assessment: Needs assistance Sitting-balance support: No upper extremity supported;Feet supported Sitting balance-Leahy Scale: Good     Standing balance support: No upper extremity supported;During functional activity Standing balance-Leahy Scale: Fair Standing balance comment: when breathless, unsafe standing without support, uses counter and railings to steady                            Cognition Arousal/Alertness: Awake/alert Behavior During Therapy: WFL for tasks assessed/performed Overall Cognitive Status: Within Functional Limits for tasks assessed                                        Exercises      General Comments General comments (skin integrity, edema, etc.): discussed safe use of  rollator      Pertinent Vitals/Pain      Home Living                      Prior Function            PT Goals (current goals can now be found in the care plan section) Acute Rehab PT Goals Patient Stated Goal: to go home  PT Goal Formulation: With patient Time For Goal Achievement: 06/08/17 Potential to Achieve Goals: Good Progress towards PT goals: Not progressing toward goals - comment (desaturation with mobility)    Frequency    Min 3X/week      PT Plan Equipment recommendations need to be updated    Co-evaluation              AM-PAC PT "6 Clicks" Daily Activity  Outcome  Measure  Difficulty turning over in bed (including adjusting bedclothes, sheets and blankets)?: A Little Difficulty moving from lying on back to sitting on the side of the bed? : A Little Difficulty sitting down on and standing up from a chair with arms (e.g., wheelchair, bedside commode, etc,.)?: A Little       6 Click Score: 9    End of Session Equipment Utilized During Treatment: Gait belt;Oxygen Activity Tolerance: Patient limited by fatigue;Treatment limited secondary to medical complications (Comment) (decr O2 sats) Patient left: with call bell/phone within reach;in bed Nurse Communication: Mobility status PT Visit Diagnosis: Unsteadiness on feet (R26.81);Muscle weakness (generalized) (M62.81);Other abnormalities of gait and mobility (R26.89)     Time: 6861-6837 PT Time Calculation (min) (ACUTE ONLY): 28 min  Charges:  $Gait Training: 23-37 mins                    G Codes:       Leighton Roach, PT  Acute Rehab Services  [place] 06/02/2017, 12:22 PM

## 2017-06-02 NOTE — Care Management Note (Addendum)
Case Management Note  Patient Details  Name: Elizabeth [name] MRN: [id number] Date of Birth: 06-21-1956  Subjective/Objective: Pt presented for Acute CHF. Pt is from home with her sister and other family support. Pt has DME 02 via [name] @ 2 L [name]. CM received call from MD in regards to smaller portable tank. CM did reach out to [facility] in regards to DME and how to get that started for portable.                    Action/Plan: CM did offer choice for HHPT- agency list provided. Pt chose [facility] and Referral made to [facility]. SOC to begin within 24-48 hours post d/c. Evaluating for increased liter flow for 02. Order to go in [facility] for [facility] to evaluate at home for a smaller portable 02 tank. No further needs at this time.   Expected Discharge Date:  06/02/17               Expected Discharge Plan:  [name]  In-House Referral:  NA  Discharge planning Services  CM Consult  Post Acute Care Choice:  Home Health Choice offered to:  Patient  DME Arranged: Rollator DME Agency:  [place]:  PT [name] Agency:  [facility]  Status of Service:  Completed, signed off  If discussed at [employer], dates discussed:    Additional Comments: 1028 06-04-17 [name], RN,BSN [phone] CM did speak with pt in regards to disposition needs. Pt will need RW order at d/c. Pt uses [facility] for DME- 02 and will need increased liter flow order. Pt will also need an additional order that reads to evaluate for portable home 02 unit. No further needs from CM at this time.  [name], RN 06/02/2017, 12:25 PM

## 2017-06-02 NOTE — Progress Notes (Signed)
Spoke with Dr Wandra Feinstein, reviewed desaturation problems today with PT, also told him, that with 4l [place] she desatted and was dyspneic after going to [facility], and took a while to recover.

## 2017-06-02 NOTE — Progress Notes (Signed)
Internal Medicine Attending:   I saw and examined the patient. I reviewed the resident's note and I agree with the resident's findings and plan as documented in the resident's note.  Patient feels much better today with no new complaints. She is able to stay with physical therapy yesterday and is maintaining her oxygen saturations on her home oxygen. She initially admitted for worsening shortness of breath is found to have pulmonary edema and evidence of right heart failure as well on her CTA chest consistent with an acute on chronic diastolic CHF exacerbation. She received IV diuresis and is now much improved. She was net -1.4 L yesterday. We will transition her to oral Lasix today.   She was also noted to be wheezing yesterday and given her history of COPD was treated for an acute COPD exacerbation with oral prednisone. We'll complete a five-day course. There was no evidence of increased purulence of sputum production and she does not require an antibiotic at this time. Patient is stable for discharge home today.

## 2017-06-03 ENCOUNTER — Encounter (HOSPITAL_COMMUNITY): Admission: RE | Admit: 2017-06-03 | Payer: Medicare Other | Source: Ambulatory Visit

## 2017-06-03 LAB — BASIC METABOLIC PANEL
ANION GAP: 8 (ref 5–15)
BUN: 21 mg/dL — ABNORMAL HIGH (ref 6–20)
CHLORIDE: 105 mmol/L (ref 101–111)
CO2: 25 mmol/L (ref 22–32)
Calcium: 9.2 mg/dL (ref 8.9–10.3)
Creatinine, Ser: 1.06 mg/dL — ABNORMAL HIGH (ref 0.44–1.00)
GFR calc non Af Amer: 56 mL/min — ABNORMAL LOW (ref >60)
Glucose, Bld: 116 mg/dL — ABNORMAL HIGH (ref 65–99)
POTASSIUM: 4.6 mmol/L (ref 3.5–5.1)
Sodium: 138 mmol/L (ref 135–145)

## 2017-06-03 MED ORDER — FUROSEMIDE 80 MG PO TABS
80.0000 mg | ORAL_TABLET | Freq: Every day | ORAL | Status: DC
  Administered 2017-06-04: 80 mg via ORAL
  Filled 2017-06-03 (×2): qty 1

## 2017-06-03 MED ORDER — FUROSEMIDE 80 MG PO TABS: 80.0000 mg | ORAL_TABLET | Freq: Every day | ORAL | Status: DC

## 2017-06-03 MED ORDER — FUROSEMIDE 80 MG PO TABS
80.0000 mg | ORAL_TABLET | Freq: Every day | ORAL | Status: DC
  Administered 2017-06-03: 80 mg via ORAL
  Filled 2017-06-03: qty 1

## 2017-06-03 MED ORDER — FUROSEMIDE 40 MG PO TABS
40.0000 mg | ORAL_TABLET | Freq: Every day | ORAL | Status: DC
  Administered 2017-06-03: 40 mg via ORAL
  Filled 2017-06-03: qty 1

## 2017-06-03 NOTE — Progress Notes (Signed)
Patient is refusing the use of CPAP for tonight.

## 2017-06-03 NOTE — Plan of Care (Signed)
Problem: Activity: Goal: Risk for activity intolerance will decrease Outcome: Progressing Patient can ambulate but needs to be on Oxygen, working towards a O2 goal for home use.

## 2017-06-03 NOTE — Progress Notes (Signed)
Patient up walking with [place], Mobilty Tech on 5 L [place], patient O2 sats dropped to mid 80's, patient needed to take multiple breaks to catch her breath. After walking pt on 4-5 L [place] sats stay around 94-98% . Spoke with Resident MD with update, Will continue to monitor.

## 2017-06-03 NOTE — Progress Notes (Signed)
Subjective: Patient endorses feeling well today. She says the mask for the CPAP machine did not fit so she did not use it overnight. She says she stills feels tired and dyspneic when walking to and from the bathroom without oxygen. She understands and is agreeable with the plan to get her walking with PT.   Objective:  Vital signs in last 24 hours: Vitals:   06/03/17 0507 06/03/17 0757 06/03/17 0837 06/03/17 0924  BP: 127/69 129/83 129/83   Pulse: 64 67 66 67  Resp: _0 Temp: 98.1 F (36.7 C) 98.3 F (36.8 C)    TempSrc: Oral Oral    SpO2:  96% 96% 97%  Weight: 99.9 kg (220 lb 3.2 oz)     Height:       Physical Exam  Constitutional: She is oriented to person, place, and time. She appears well-developed and well-nourished. No distress.  HENT:  Head: Normocephalic and atraumatic.  Cardiovascular: Normal rate, regular rhythm and normal heart sounds.   No murmur heard. No peripheral/abdominal/sacral edema.   Pulmonary/Chest: Effort normal and breath sounds normal. No respiratory distress. She has no wheezes. She has no rales. She exhibits no tenderness.  Abdominal: Soft. Bowel sounds are normal. She exhibits no distension. There is no tenderness.  Neurological: She is alert and oriented to person, place, and time.  Skin: Skin is warm and dry.   Assessment/Plan:  Active Problems:   Acute exacerbation of CHF (congestive heart failure) (HCC)   CHF exacerbation (HCC)  Acute on Chronic Dyspnea: Per mobility tech report patient needed 4-5L [place] to stay mid 90s after walking. Required 4L with PT yesterday as well and 3L at rest of HFNC to sat 88-92%. Less dyspneic at rest than prior with mid 90s+ on 3L HFNC. No fluid overload s/p three days of high dose IV lasix. May need to be discharged on more O2 than prior.  - Transfer to med surg floor to assess low flow [place] O2 requirement - Will provide portable oxygen tank evaluation order upon discharge - Has HHPT set up already for after  discharge  - F/u BMP from this morning  - Continue Duoneb 3 mL q6h sch - Restart her lasix 80 mg PO qAM + 40 mg PO qn today - Continue prednisone 40 mg PO qd today (day 3/5), will discharge with this medication to finish a full 5 day course  - Continue incentive spirometry today    CHF: Not volume up on exam today, pulm edema likely improved. Followed by [facility], last seen April 4196, combined systolic (QI=29-79%)GXQ grade 2 diastolic left heart failure. Per cardiology her regimen is lasix 80 qAM + 40 qPM. To f/u w/ cardiology on 07/30. Repeat echo from 07/16 showed no significant changes compared to prior study from January, with grade 2 diastolic dysfunction of LV and severely increased PA peak pressure of 69 mm Hg, likely elevated in setting of acute HF exacerbation. 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 Likely secondary to her left heart failure and COPD may also be a contributor. 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  - Restart home lasix  - Continue coreg 12.5 mg po bid  - Restart home diovan on discharge    COPD: Treating for mild COPD exacerbation. Uses albuterol PRN and Trelegy at home. Quit smoking 6 months ago, smoked 25 pack years. Most recent PFTs 06/04/16 showed FEV 62% pred (pre-BD) and 69% pred (post-BD),  FEV1/ FVC pred 78% (pre-BD) and 89% (post-BD), TLC 78%, and DLCO 44%. C/w moderate obstruction,minimal restriction, and severe diffusion defect. - Continue maintenance Breo Ellipta 1 puff qd  - Continue maintenance Incruse Ellipta 1 puff qd  - Continue Duoneb 3 mL q6h sch  - Continue prednisone 40 mg PO qd (day 3/5)  CAD: History of quad bypass CABG in 07/17.  - Continue statin 80 mg po  - Continue coreg 12.5 mg po bid   Chest Pain:Patient presented complaining of right-sided chest pain underneath the breast worse with palpation of the ribs. Likely MSK-related. Has not had further complaints after treating with voltaren  gel. Has not required use in past 2 days, patients feels it has resolved.  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 In the hospital per nursing reports she desats to 70s-80s during sleep. Has been on CPAP to good effect per patient report but purportedly has issues with mask fitting. Will need repeat sleep study as outpatient. Does not have CPAP machine at home.  - D/c CPAP at night  - Will encourage PCP to schedule for sleep study as outpatient   History of SLE, amiodarone use:Not on medication, no known lung disease or complications.   UYE:BXID meds are coreg, diovan and amlodipine.  - Continue coreg  - Hold diovan and almodipine in setting of soft BP readings around 100s  Acute Gout Flare: Patient complaints of acute onset R MTP pain. On exam appears to be a mild flare with some swelling but without significant ROM restriction, erythema or TTP. S/p three doses of ibuprofen 800 mg two days ago. Treating with prednisone course for COPD exacerbation. - Continue prednisone 40 mg PO qd (day 3/5)   Paroxysmal atrial fibrillation:CHA2DS-Vasc score = 5 and needs lifelong anticoagulation per cardiology. Patient was not taking AC for 1 week prior to presentation as she was not able to get transportation to the pharmacy. Talked to the patient and she now has access after discharge.  - Continue eliquis 5 mg PO bid  - Continue coreg   DVT ppx: Eliquis Dispo: Anticipated discharge tomorrow.   Lethea Killings, Medical Student 06/03/2017, 11:51 AM Pager: [phone]

## 2017-06-04 ENCOUNTER — Telehealth: Payer: Self-pay

## 2017-06-04 ENCOUNTER — Telehealth: Payer: Self-pay | Admitting: *Deleted

## 2017-06-04 MED ORDER — PREDNISONE 20 MG PO TABS: 40.0000 mg | ORAL_TABLET | Freq: Every day | ORAL | 0 refills | Status: DC

## 2017-06-04 NOTE — Telephone Encounter (Signed)
[facility] called to give an FYI that pt was disch from [facility] today, informed her internal medicine took care of pt while inpt and pt will f/u w/ dr blum 7/24 in clinic [facility] rep also suggested that pt should be referred to [facility] for home management of CHF and COPD, she states referral can be put in at appt., informed her that we do refer to [facility] at times and will remind dr Hetty Ely.

## 2017-06-04 NOTE — Progress Notes (Signed)
Internal Medicine Attending:   I saw and examined the patient. I reviewed the resident's note and I agree with the resident's findings and plan as documented in the resident's note.  Patient feels well today with no new complaints. She was initially admitted for worsening shortness of breath like he secondary to mild CHF exacerbation versus COPD exacerbation. CTA done on admission showed no PE but did have mild pulmonary edema and evidence of right heart failure as well. She is also noted to have wheezing initially on hospitalization possibly secondary to a COPD exacerbation. Patient was treated with IV diuresis initially and improved and was transitioned to her home dose of oral Lasix. Patient was also treated with a course of steroids for possible COPD exacerbation. We will complete a 5 day course of prednisone 40 mg. Patient continues to have episodes of desaturation while ambulating and requires a higher amount of oxygen up to 4-6 L with activity. Patient is stable for discharge today but will need close follow-up with her PCP as well as pulmonary as an outpatient. No further workup for now.

## 2017-06-04 NOTE — Telephone Encounter (Signed)
Thank you, I will place this referral to [facility] at her visit with me.

## 2017-06-04 NOTE — Progress Notes (Signed)
Physical Therapy Treatment Patient Details Name: Elizabeth [name] MRN: [id number] DOB: 17-Sep-1956 Today's Date: 06/04/2017    History of Present Illness Pt is a 61 y/o female admitted secondary to acute CHF exacerbation. CT negative for PE. PMH includes fibromyalgia, depression, HTN, CVA, CAD s/p CABG, a fib, CHF, and COPD on 2L of oxygen at home.     PT Comments    Pt requiring 6L of O2 for ambulation. Pt does desat (83%) on this if she tries to do too much at one time. Educated pt on need to pace herself and not to try to do too much at one time. Instructed her to take brief rest breaks before she feels she needs to rest.  Pt will need other caregivers to reinforce this.  Follow Up Recommendations  Other (comment) (resume pulmonary rehab)     Equipment Recommendations  Other (comment) (rollator with seat)    Recommendations for Other Services       Precautions / Restrictions Precautions Precautions: Other (comment) Precaution Comments: watch O2 sats Restrictions Weight Bearing Restrictions: No    Mobility  Bed Mobility Overal bed mobility: Independent                Transfers Overall transfer level: Independent Equipment used: None Transfers: Sit to/from Stand Sit to Stand: Independent            Ambulation/Gait Ambulation/Gait assistance: Modified independent (Device/Increase time) Ambulation Distance (Feet): 475 Feet Assistive device: 4-wheeled walker Gait Pattern/deviations: Step-through pattern;Decreased stride length;Wide base of support   Gait velocity interpretation: at or above normal speed for age/gender General Gait Details: Began amb on 4L O2 with SpO2 quickly dropping to less than 88%. Incr to 6L. SpO2 >90% until about 225' with it then dropping to 83%. Had pt sit on rollator until SpO2 back above 90%. Continued amb and had pt stop and take standing rest break after 100' even though she didn't feel she needed it. Then returned to the room.  In all pt amb 475' in 6 minutes including rest breaks.   Stairs            Wheelchair Mobility    Modified Rankin (Stroke Patients Only)       Balance Overall balance assessment: Needs assistance Sitting-balance support: No upper extremity supported;Feet supported Sitting balance-Leahy Scale: Good     Standing balance support: No upper extremity supported;During functional activity Standing balance-Leahy Scale: Good                              Cognition Arousal/Alertness: Awake/alert Behavior During Therapy: WFL for tasks assessed/performed Overall Cognitive Status: Within Functional Limits for tasks assessed                                        Exercises      General Comments        Pertinent Vitals/Pain Pain Assessment: No/denies pain    Home Living                      Prior Function            PT Goals (current goals can now be found in the care plan section) Progress towards PT goals: Progressing toward goals    Frequency    Min 3X/week      PT Plan Equipment  recommendations need to be updated;Discharge plan needs to be updated    Co-evaluation              AM-PAC PT "6 Clicks" Daily Activity  Outcome Measure  Difficulty turning over in bed (including adjusting bedclothes, sheets and blankets)?: None Difficulty moving from lying on back to sitting on the side of the bed? : None Difficulty sitting down on and standing up from a chair with arms (e.g., wheelchair, bedside commode, etc,.)?: None Help needed moving to and from a bed to chair (including a wheelchair)?: None Help needed walking in hospital room?: None Help needed climbing 3-5 steps with a railing? : A Little 6 Click Score: 23    End of Session Equipment Utilized During Treatment: Oxygen Activity Tolerance: Patient tolerated treatment well (with rest breaks) Patient left: with call bell/phone within reach;in bed   PT Visit  Diagnosis: Unsteadiness on feet (R26.81);Muscle weakness (generalized) (M62.81);Other abnormalities of gait and mobility (R26.89)     Time: 3559-7416 PT Time Calculation (min) (ACUTE ONLY): 18 min  Charges:  $Gait Training: 8-22 mins                    G Codes:       [facility] PT [place] 06/04/2017, 9:56 AM

## 2017-06-04 NOTE — Care Management Important Message (Signed)
Important Message  Patient Details  Name: Elizabeth [name] MRN: [id number] Date of Birth: 03/23/1956   Medicare Important Message Given:  Yes    Nathen May 06/04/2017, 10:29 AM

## 2017-06-04 NOTE — Progress Notes (Signed)
Subjective: Patient says she is doing well today. She felt that she performed better on the PT walk test than on prior days. She feels confident that she will be able to function at home with her oxygen tank. We discussed the plan for discharge today and she agrees.    Objective:  Vital signs in last 24 hours: Vitals:   06/03/17 1723 06/03/17 1932 06/03/17 1955 06/04/17 0400  BP: 127/81  116/83 134/77  Pulse: 65  65 68  Resp:   17 16  Temp:   97.9 F (36.6 C) 97.8 F (36.6 C)  TempSrc:   Oral Oral  SpO2:  100% 98% 93%  Weight:    97.9 kg (215 lb 14.4 oz)  Height:       Physical Exam  Constitutional: She appears well-developed and well-nourished. No distress.  HENT:  Head: Normocephalic and atraumatic.  Cardiovascular: Normal rate, regular rhythm and normal heart sounds.   No murmur heard. Pulmonary/Chest: Effort normal and breath sounds normal. No respiratory distress. She has no wheezes.  Musculoskeletal:  No peripheral edema.  Vitals reviewed.  Assessment/Plan:  Active Problems:   Acute exacerbation of CHF (congestive heart failure) (HCC)   CHF exacerbation (HCC)  Acute on Chronic Dyspnea: Sats 95+ at 2L [facility] but needed 4-6L with activity to maintain O2 sat. Symptomatically she felt better than with prior trials, no lightheadedness but still dyspneic and requiring some breaks. Her total ambulation length of 475 feet is improved from prior. She says that they already knew of her increased oxygen requirement with activity at pulmonary rehab so at this point she is likely back to where she was as an outpatient. Overall appears she came in with a combination of mild COPD and CHF exacerbations given her improvement with diuresis and steroids. She has diuresed well and has not had persistent wheezing. Her creatinine has been stable at 1. We have restarted her on her home lasix dose of 120 po. We will discharge her with instructions for 2L at rest and up to 4-6L as needed with  activity - Will provide portable oxygen tank evaluation order upon discharge with instructions  - Has HHPT set up already for after discharge  - Continue Duoneb 3 mL q6h sch - Continue her lasix 80 mg PO qAM + 40 mg PO qn today - Continue prednisone 40 mg PO qd today (day 4/5), will discharge with this medication to finish a full 5 day course  - Continue incentive spirometry today    CHF: EF=50-55% and grade 2 diastolic left heart failure.  Repeat echo unchanged with PA pressure 69 mm Hg. Likely secondary to her acute heart failure exacerbation. She has diuresed 3+ liters with IV lasix transitioned to PO.  - To f/u w/ cardiology on 07/30 - Continue home lasix  - Continue coreg 12.5 mg po bid   COPD: Treating for mild COPD exacerbation given her dyspnea. History of Grade 1 COPD.   - Continue maintenance Breo Ellipta 1 puff qd, Incruse Ellipta 1 puff qd - Duonebs - Continue prednisone 40 mg PO qd day 4/5  CAD: History of quad bypass CABG in 07/17.  - Continue statin 80 mg po  - Continue coreg 12.5 mg po bid   OSA:H/o mild OSA per 2013 sleep study.  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  - Hold diovan and almodipine in setting of soft BP readings around  100s - Restart home diovan on discharge - recommend PCP reassess needfor restarting amlodipine   Chest Pain:Resolved Acute Gout Flare: Resolved - Continue prednisone 40 mg PO qd day 4/5  Paroxysmal atrial fibrillation:CHA2DS-Vasc score of 5. Patient has access to eliquis at home after discharge.  - Continue eliquis 5 mg PO bid  - Continue coreg   DVT KGO:VPCHEKB  Dispo: Anticipated discharge today.  Lethea Killings, Medical Student 06/04/2017, 7:56 AM Pager: [phone]  Attestation for Student Documentation:  I personally was present and performed or re-performed the history, physical exam and medical decision-making activities  of this service and have verified that the service and findings are accurately documented in the student's note.  Shela Leff, MD 06/04/2017, 1:55 PM

## 2017-06-04 NOTE — Consult Note (Signed)
[facility] CM Primary Care Navigator  06/04/2017  Elizabeth [name] 12-12-1955 [id number]   Attempt to see patientat the bedsideto identify possible discharge needs but she was alreadydischarged.  Patient was discharged home today per RN report .  Primary care provider's office called ([name])to notify of patient's discharge and need for post hospital follow-up and transition of care. Reminded as well of patient's health issues (particularly HF and COPD management) needing follow-up.  Bonnita Nasuti verbalized that patient has a follow-up appointment with Dr. Conchita Paris 7/24 at 3:45 pm.  Made aware to refer patient to [facility] care management ifdeemed appropriateand necessary for further services at home.   For questions, please contact:  Dannielle Huh, BSN, RN- [facility] Primary Care Navigator  Telephone: [phone] [place]

## 2017-06-04 NOTE — Progress Notes (Signed)
SATURATION QUALIFICATIONS: (This note is used to comply with regulatory documentation for home oxygen)  Patient Saturations on Room Air at Rest = NA%  Patient Saturations on Room Air while Ambulating = NA%  Patient Saturations on 2L/4L/6L Liters of oxygen while Ambulating = 83/87/>90%  Please briefly explain why patient needs home oxygen: The patient requires oxygen at home because she can not maintain Sp02 greater than 90% while resting or ambulating.   Saddie Benders RN

## 2017-06-04 NOTE — Telephone Encounter (Signed)
Elizabeth [name] with [facility] want to informed the doctor that PT will see patient on 06/07/2017.

## 2017-06-07 DIAGNOSIS — I2510 Atherosclerotic heart disease of native coronary artery without angina pectoris: Secondary | ICD-10-CM | POA: Diagnosis not present

## 2017-06-07 DIAGNOSIS — I279 Pulmonary heart disease, unspecified: Secondary | ICD-10-CM | POA: Diagnosis not present

## 2017-06-07 DIAGNOSIS — M1991 Primary osteoarthritis, unspecified site: Secondary | ICD-10-CM | POA: Diagnosis not present

## 2017-06-07 DIAGNOSIS — I50810 Right heart failure, unspecified: Secondary | ICD-10-CM | POA: Diagnosis not present

## 2017-06-07 DIAGNOSIS — I5043 Acute on chronic combined systolic (congestive) and diastolic (congestive) heart failure: Secondary | ICD-10-CM | POA: Diagnosis not present

## 2017-06-07 DIAGNOSIS — J439 Emphysema, unspecified: Secondary | ICD-10-CM | POA: Diagnosis not present

## 2017-06-07 DIAGNOSIS — I110 Hypertensive heart disease with heart failure: Secondary | ICD-10-CM | POA: Diagnosis not present

## 2017-06-07 DIAGNOSIS — M25562 Pain in left knee: Secondary | ICD-10-CM | POA: Diagnosis not present

## 2017-06-07 DIAGNOSIS — I255 Ischemic cardiomyopathy: Secondary | ICD-10-CM | POA: Diagnosis not present

## 2017-06-07 DIAGNOSIS — M899 Disorder of bone, unspecified: Secondary | ICD-10-CM | POA: Diagnosis not present

## 2017-06-07 DIAGNOSIS — M109 Gout, unspecified: Secondary | ICD-10-CM | POA: Diagnosis not present

## 2017-06-07 DIAGNOSIS — I480 Paroxysmal atrial fibrillation: Secondary | ICD-10-CM | POA: Diagnosis not present

## 2017-06-07 DIAGNOSIS — J984 Other disorders of lung: Secondary | ICD-10-CM | POA: Diagnosis not present

## 2017-06-08 ENCOUNTER — Telehealth: Payer: Self-pay

## 2017-06-08 ENCOUNTER — Encounter (HOSPITAL_COMMUNITY): Admission: RE | Admit: 2017-06-08 | Payer: Medicare Other | Source: Ambulatory Visit

## 2017-06-08 ENCOUNTER — Encounter: Payer: Medicare Other | Admitting: Internal Medicine

## 2017-06-08 ENCOUNTER — Encounter: Payer: Self-pay | Admitting: Internal Medicine

## 2017-06-08 NOTE — Telephone Encounter (Signed)
Flor with [facility] requesting VO. Please call back.

## 2017-06-08 NOTE — Progress Notes (Deleted)
-   Refer to [facility]   Chronic diastolic heart failure  Recently hospitalized for CHF exacerbation when she presented with worsening of her baseline shortness of breath. Eechocardiogram showed EF 50-55% and grade 2 diastolic dysfunction were stable from prior in January. She was treated with IV Lasix and symptoms improved drastically on the day of discharge.  - continue Lasix 80 mg qAM, 40 mg qPM  *apt scheduled with Dr. Sallyanne Kuster 7/30  CAD  *** chest pain  - continue coreg 12.5 mg BID and atorvastatin 80 mg daily   Obstructive sleep apnea Previous sleep study in 2013 showed Mild OSA. During recent hospitalization she was found to desaturate to 70s while sleeping which responded to the use of CPAP. Reports *** morning headaches, nocturia, snoring, excessive tiredness, fatigue, and poor concentration during the day.   - order repeat sleep study today   Hypertension  BP Readings from Last 3 Encounters:  06/04/17 134/77  05/03/17 138/75  04/27/17 (!) 143/79  Remained normotensive on coreg and valsartan during hospitalization so amlodipine 5 mg dailywas held at discharge. Today her blood pressure is *** controlled  - continue valsartan 320 mg daily and coreg   Vitamin D deficiency  Vitamin D was 6.4 in 11/2016. She has been on Vitamin D 50,000 units weekly since this time.  - Follow up Vitamin D level today   Paroxysmal atrial fibrillation  *** denies bleeding  - Continue Eliquis 5 mg qd   Depression  PHQ9 12 at last office visit  - PHQ9 today  - continue duloxetine 30 mg daily   COPD  Completed a prednisone taper while inpatient.  - Continue Trelegy inhaler 1 puff daily, albuterol PRN

## 2017-06-09 ENCOUNTER — Encounter: Payer: Self-pay | Admitting: Internal Medicine

## 2017-06-09 ENCOUNTER — Ambulatory Visit (INDEPENDENT_AMBULATORY_CARE_PROVIDER_SITE_OTHER): Payer: Medicare Other | Admitting: Internal Medicine

## 2017-06-09 VITALS — BP 121/79 | HR 71 | Temp 97.7°F | Ht 62.0 in | Wt 222.7 lb

## 2017-06-09 DIAGNOSIS — I110 Hypertensive heart disease with heart failure: Secondary | ICD-10-CM | POA: Diagnosis not present

## 2017-06-09 DIAGNOSIS — E669 Obesity, unspecified: Secondary | ICD-10-CM | POA: Diagnosis not present

## 2017-06-09 DIAGNOSIS — I480 Paroxysmal atrial fibrillation: Secondary | ICD-10-CM | POA: Diagnosis not present

## 2017-06-09 DIAGNOSIS — E559 Vitamin D deficiency, unspecified: Secondary | ICD-10-CM

## 2017-06-09 DIAGNOSIS — F32A Depression, unspecified: Secondary | ICD-10-CM

## 2017-06-09 DIAGNOSIS — F419 Anxiety disorder, unspecified: Secondary | ICD-10-CM | POA: Diagnosis not present

## 2017-06-09 DIAGNOSIS — F329 Major depressive disorder, single episode, unspecified: Secondary | ICD-10-CM | POA: Diagnosis not present

## 2017-06-09 DIAGNOSIS — I5032 Chronic diastolic (congestive) heart failure: Secondary | ICD-10-CM | POA: Diagnosis not present

## 2017-06-09 DIAGNOSIS — G4733 Obstructive sleep apnea (adult) (pediatric): Secondary | ICD-10-CM | POA: Diagnosis not present

## 2017-06-09 DIAGNOSIS — J439 Emphysema, unspecified: Secondary | ICD-10-CM

## 2017-06-09 DIAGNOSIS — I2510 Atherosclerotic heart disease of native coronary artery without angina pectoris: Secondary | ICD-10-CM

## 2017-06-09 DIAGNOSIS — I10 Essential (primary) hypertension: Secondary | ICD-10-CM

## 2017-06-09 MED ORDER — DICLOFENAC SODIUM 1 % TD GEL: 4.0000 g | Freq: Four times a day (QID) | TRANSDERMAL | 1 refills | Status: DC

## 2017-06-09 NOTE — Telephone Encounter (Signed)
Vo given for PT 3x 4 weeks for balance, gait training,strengthening,  safety do you agree?

## 2017-06-09 NOTE — Progress Notes (Signed)
CC: follow up of chronic diastolic heart failure   HPI:  [name] is a 61 y.o. with PMH as listed below who presents for follow up after hospitalization for volume overload and acute respiratory failure. Please see the assessment and plans for the status of the patient chronic medical problems.    Past Medical History:  Diagnosis Date  . CAD (coronary artery disease)    NSTEMI 08/2011:  LHC 08/21/11: mLAD 60-70%, pCFX occluded, dRCA chronic occlusion with L-R collats, EF 40-45%, inf AK.  PCI:  BMS to CFX.  Marland Kitchen CHF (congestive heart failure) ([place]) 05/31/2017  . COPD (chronic obstructive pulmonary disease) ([place]) 11/17/2016  . Depression   . Fibroids   . Fibromyalgia   . GERD (gastroesophageal reflux disease)   . Gout   . History of blood transfusion 1970s  . HLD (hyperlipidemia)    Chol = 235, LDL = 156 (08/2010)  . Hypertension   . Lupus 2009   ANA + 10/2008, repeat ANA + (05/2009), on that visit 05/2009 following labs obtained RF <20, CRP <0.4, ANA titers 1:80,   . NSTEMI (non-ST elevated myocardial infarction) ([place]) 08/2011   "I had 2 heart attacks back to back" (05/31/2017)  . On home oxygen therapy    "2L; 24/7 (05/31/2017)  . Pulmonary nodules 05/2008   noted on CXR and CT 05/2008, repeat CT 10/2008 - Stable small bilateral pulmonary nodules measuring up to 6 m m  . Stroke [facility]) 04/2014   "mini stroke"; denies residual on 05/31/2017  . Transient ischemic attack    Review of Systems:  Refer to history of present illness and assessment and plans for pertinent review of systems, all others reviewed and negative  Physical Exam:  Vitals:   06/09/17 1322  BP: 121/79  Pulse: 71  Temp: 97.7 F (36.5 C)  TempSrc: Oral  SpO2: 96%  Weight: 222 lb 11.2 oz (101 kg)  Height: _0  (1.575 m)   Physical Exam  Constitutional: She is well-developed, well-nourished, and in no distress. No distress.  HENT:  Head: Normocephalic and atraumatic.  Cardiovascular: Normal  rate and regular rhythm.   No murmur heard. Pulmonary/Chest: No respiratory distress. She has no wheezes. She has no rales.  Wearing nasal canula, speaking in full sentences   Skin: She is not diaphoretic.     Assessment & Plan:   Chronic diastolic heart failure  Recently hospitalized for CHF exacerbation when she presented with worsening of her baseline shortness of breath. Echocardiogram showed EF 50-55% and grade 2 diastolic dysfunction were stable from prior in January. She was treated with IV Lasix and symptoms improved drastically on the day of discharge. Today she says she is feeling great, denies orthopnea, chest pain, or change from baseline SOB. Lungs are clear and shes euvolemic on exam.  - continue Lasix 80 mg qAM, 40 mg qPM  - Referral placed to [facility] for home management of CHF and COPD   CAD s/p CABG  Describes a sharp pain in her left breast which is not reproducible or worse with deep breathing or cough. The pain resolves spontaneously and is not associated with exertion. She had this same pain at hospital admission, EKG at that time showed no acute ischemic changes.  - discussed return precautions  - continue coreg 12.5 mg BID and atorvastatin 80 mg daily   Obstructive sleep apnea  Previous sleep study in 2013 showed Mild OSA. During recent hospitalization she was found to desaturate to 66s while  sleeping which responded to the use of CPAP. Reports poor sleep, excessive tiredness, fatigue, and poor concentration during the day.   Assessment: Concerning for untreated OSA  - order repeat sleep study today   Hypertension  BP Readings from Last 3 Encounters:  06/09/17 121/79  06/04/17 134/77  05/03/17 138/75  Remained normotensive on coreg and valsartan during hospitalization so amlodipine 5 mg dailywas held at discharge. Today her blood pressure is well controlled  - continue valsartan 320 mg daily and coreg   Vitamin D deficiency  Vitamin D was 6.4 in 11/2016. She has  completed 2 months of Vitamin D 50,000 units weekly but has not had follow up vitamin D level testing.  - Follow up Vitamin D level today >> 7.0, will need to continue supplementation and recheck level in 3 months   Paroxysmal atrial fibrillation  CHADS-VASC 5 (TIA, female, HTN, CAD). Denies cutaneous or GI bleeding.  Assessment: NSR on exam today - Continue Eliquis 5 mg qd   Depression  PHQ9 12 at last office visit and she was prescribed duloxetine. She changed her mind about trying this medication and has not picked it up. She is feeling better today, denies having low interest or feeling depressed, HI or SI. She looks a lot better to me as well. She feels like she is in control of her mood and does not want to be on medication for this.  - Continue to monitor  COPD GOLD B, on 2.5 L home oxygen 24/7 Completed a prednisone taper while inpatient. Denies worsening of her baseline SOB or wheeze. Symptoms feel much better than the time of admission.  - Continue Trelegy inhaler 1 puff daily, albuterol PRN  -continue pulmonary rehab  - continue supplemental O2   Severe obesity with comorbidity ( BMI > 40)  BMI 40 with comorbidities of CAD, HTN, OSA and prediabetes. Briefly discussed benefits and options for weight loss. She would like to try diet management first. Given her reduced cardiac and pulmonary function I agree that surgery may not be her best option.  - Referral to [facility] dietitian for weight loss management  See Encounters Tab for problem based charting.  Patient discussed with Dr. Evette Doffing

## 2017-06-09 NOTE — Patient Instructions (Addendum)
Ms. Tamplin,   It was a pleasure to see you today. A larger feeling so much better. I ordered a sleep study someone should contact you to have this set up. I have ordered a referral for you to have a home health nurse through [facility]. I have also referred to the dietitian in our office. If you do not hear back about any of these or need anything please call the office.  He schedule a follow-up appointment with me in 3-6 months

## 2017-06-10 ENCOUNTER — Encounter (HOSPITAL_COMMUNITY): Payer: Medicare Other

## 2017-06-10 ENCOUNTER — Other Ambulatory Visit: Payer: Self-pay | Admitting: *Deleted

## 2017-06-10 DIAGNOSIS — I2510 Atherosclerotic heart disease of native coronary artery without angina pectoris: Secondary | ICD-10-CM | POA: Diagnosis not present

## 2017-06-10 DIAGNOSIS — I110 Hypertensive heart disease with heart failure: Secondary | ICD-10-CM | POA: Diagnosis not present

## 2017-06-10 DIAGNOSIS — I480 Paroxysmal atrial fibrillation: Secondary | ICD-10-CM | POA: Diagnosis not present

## 2017-06-10 DIAGNOSIS — I279 Pulmonary heart disease, unspecified: Secondary | ICD-10-CM | POA: Diagnosis not present

## 2017-06-10 DIAGNOSIS — M899 Disorder of bone, unspecified: Secondary | ICD-10-CM | POA: Diagnosis not present

## 2017-06-10 DIAGNOSIS — M1991 Primary osteoarthritis, unspecified site: Secondary | ICD-10-CM | POA: Diagnosis not present

## 2017-06-10 DIAGNOSIS — J439 Emphysema, unspecified: Secondary | ICD-10-CM | POA: Diagnosis not present

## 2017-06-10 DIAGNOSIS — J984 Other disorders of lung: Secondary | ICD-10-CM | POA: Diagnosis not present

## 2017-06-10 DIAGNOSIS — I50810 Right heart failure, unspecified: Secondary | ICD-10-CM | POA: Diagnosis not present

## 2017-06-10 DIAGNOSIS — I255 Ischemic cardiomyopathy: Secondary | ICD-10-CM | POA: Diagnosis not present

## 2017-06-10 DIAGNOSIS — M25562 Pain in left knee: Secondary | ICD-10-CM | POA: Diagnosis not present

## 2017-06-10 DIAGNOSIS — M109 Gout, unspecified: Secondary | ICD-10-CM | POA: Diagnosis not present

## 2017-06-10 DIAGNOSIS — I5043 Acute on chronic combined systolic (congestive) and diastolic (congestive) heart failure: Secondary | ICD-10-CM | POA: Diagnosis not present

## 2017-06-10 LAB — VITAMIN D 25 HYDROXY (VIT D DEFICIENCY, FRACTURES): Vit D, 25-Hydroxy: 7 ng/mL — ABNORMAL LOW (ref 30.0–100.0)

## 2017-06-10 NOTE — Telephone Encounter (Signed)
Thank you. Yes I agree.

## 2017-06-10 NOTE — Patient Outreach (Signed)
[facility]) Care Management  06/10/2017  PHUNG KOTAS Sep 20, 1956 [id number]  Telephone Screen  Referral Date: 06/09/17 Referral Source: MD office (Dr. Ledell Noss) Referral Reason: Home management of CHF and COPD Insurance: [facility] Medicare, Medicaid  Outreach attempt # 1 spoke with patient regarding MD referral. HIPAA verified with patient.  Social:  Patient lives with her sister and other family members. Patient stated, "she is able to complete her ADLs independently, however it may take her some time to complete". Patient uses oxygen continuously. Patient is active with [facility] (PT/OT).  Conditions: Past Medical Hx: CVA, CAD, CHF, PAF, GERD, HLD, HTN, Lupus, OSA, Fibromyalgia, COPD, Depression, Gout, Fibroids Patient was recently admitted/discharged from [facility] (05/31/17 - 06/04/17) with CHF exacerbation. Patient had three separate urgent care visits within the last 90 days. Patient is participating in pulmonary rehab.   Medications:  Patient taking greater than 10 meds per day. She reported being able to afford her medications and taking them as prescribed.   Appointments:  Patient most recent PCP appointment was on 06/09/17. Her next upcoming PCP appointment is 12/10/17.  Advanced Directives: Pt reported not having an Advanced Directive. She declined receiving information regarding Advanced Directives  Consent: [facility] services reviewed and discussed with patient. Verbal consent received.  Plan: RN CM will send referral to [facility] RN for further in home eval/assessment of care needs and management of chronic conditions. RN CM will send [place] referral for polypharmacy  RN CM advised patient to contact RN CM for any needs or concerns.  Lake Bells, RN, BSN, MHA/MSL, [place] Telephonic Care Manager Coordinator [facility] Direct Phone: [phone] Toll Free: [phone] Fax: [phone]

## 2017-06-11 DIAGNOSIS — I50810 Right heart failure, unspecified: Secondary | ICD-10-CM | POA: Diagnosis not present

## 2017-06-11 DIAGNOSIS — J984 Other disorders of lung: Secondary | ICD-10-CM | POA: Diagnosis not present

## 2017-06-11 DIAGNOSIS — I5043 Acute on chronic combined systolic (congestive) and diastolic (congestive) heart failure: Secondary | ICD-10-CM | POA: Diagnosis not present

## 2017-06-11 DIAGNOSIS — I110 Hypertensive heart disease with heart failure: Secondary | ICD-10-CM | POA: Diagnosis not present

## 2017-06-11 DIAGNOSIS — M25562 Pain in left knee: Secondary | ICD-10-CM | POA: Diagnosis not present

## 2017-06-11 DIAGNOSIS — I279 Pulmonary heart disease, unspecified: Secondary | ICD-10-CM | POA: Diagnosis not present

## 2017-06-11 DIAGNOSIS — I2510 Atherosclerotic heart disease of native coronary artery without angina pectoris: Secondary | ICD-10-CM | POA: Diagnosis not present

## 2017-06-11 DIAGNOSIS — I480 Paroxysmal atrial fibrillation: Secondary | ICD-10-CM | POA: Diagnosis not present

## 2017-06-11 DIAGNOSIS — M109 Gout, unspecified: Secondary | ICD-10-CM | POA: Diagnosis not present

## 2017-06-11 DIAGNOSIS — I255 Ischemic cardiomyopathy: Secondary | ICD-10-CM | POA: Diagnosis not present

## 2017-06-11 DIAGNOSIS — J439 Emphysema, unspecified: Secondary | ICD-10-CM | POA: Diagnosis not present

## 2017-06-11 DIAGNOSIS — M1991 Primary osteoarthritis, unspecified site: Secondary | ICD-10-CM | POA: Diagnosis not present

## 2017-06-11 DIAGNOSIS — M899 Disorder of bone, unspecified: Secondary | ICD-10-CM | POA: Diagnosis not present

## 2017-06-13 ENCOUNTER — Encounter: Payer: Self-pay | Admitting: Internal Medicine

## 2017-06-13 MED ORDER — VITAMIN D (ERGOCALCIFEROL) 1.25 MG (50000 UNIT) PO CAPS: 50000.0000 [IU] | ORAL_CAPSULE | ORAL | 0 refills | Status: DC

## 2017-06-13 NOTE — Assessment & Plan Note (Signed)
Recently hospitalized for CHF exacerbation when she presented with worsening of her baseline shortness of breath. Echocardiogram showed EF 50-55% and grade 2 diastolic dysfunction were stable from prior in January. She was treated with IV Lasix and symptoms improved drastically on the day of discharge. Today she says she is feeling great, denies orthopnea, chest pain, or change from baseline SOB. Lungs are clear and shes euvolemic on exam.  - continue Lasix 80 mg qAM, 40 mg qPM  - Referral placed to [facility] for home management of CHF and COPD

## 2017-06-13 NOTE — Assessment & Plan Note (Signed)
Vitamin D was 6.4 in 11/2016. She has completed 2 months of Vitamin D 50,000 units weekly but has not had follow up vitamin D level testing.  - Follow up Vitamin D level today >> 7.0, will need to continue supplementation and recheck level in 3 months

## 2017-06-13 NOTE — Assessment & Plan Note (Signed)
CHADS-VASC 5 (TIA, female, HTN, CAD). Denies cutaneous or GI bleeding.  Assessment: NSR on exam today - Continue Eliquis 5 mg qd

## 2017-06-13 NOTE — Assessment & Plan Note (Signed)
Completed a prednisone taper while inpatient. Denies worsening of her baseline SOB or wheeze. Symptoms feel much better than the time of admission.  - Continue Trelegy inhaler 1 puff daily, albuterol PRN  -continue pulmonary rehab  - continue supplemental O2

## 2017-06-13 NOTE — Assessment & Plan Note (Signed)
PHQ9 12 at last office visit and she was prescribed duloxetine. She changed her mind about trying this medication and has not picked it up. She is feeling better today, denies having low interest or feeling depressed, HI or SI. She looks a lot better to me as well. She feels like she is in control of her mood and does not want to be on medication for this.  - Continue to monitor

## 2017-06-13 NOTE — Assessment & Plan Note (Signed)
Previous sleep study in 2013 showed Mild OSA. During recent hospitalization she was found to desaturate to 70s while sleeping which responded to the use of CPAP. Reports poor sleep, excessive tiredness, fatigue, and poor concentration during the day.   Assessment: Concerning for untreated OSA  - order repeat sleep study today

## 2017-06-13 NOTE — Assessment & Plan Note (Signed)
BP Readings from Last 3 Encounters:  06/09/17 121/79  06/04/17 134/77  05/03/17 138/75  Remained normotensive on coreg and valsartan during hospitalization so amlodipine 5 mg dailywas held at discharge. Today her blood pressure is well controlled  - continue valsartan 320 mg daily and coreg

## 2017-06-13 NOTE — Assessment & Plan Note (Signed)
Describes a sharp pain in her left breast which is not reproducible or worse with deep breathing or cough. The pain resolves spontaneously and is not associated with exertion. She had this same pain at hospital admission, EKG at that time showed no acute ischemic changes.  - discussed return precautions  - continue coreg 12.5 mg BID and atorvastatin 80 mg daily

## 2017-06-13 NOTE — Assessment & Plan Note (Signed)
BMI 40 with comorbidities of CAD, HTN, OSA and prediabetes. Briefly discussed benefits and options for weight loss. She would like to try diet management first. Given her reduced cardiac and pulmonary function I agree that surgery may not be her best option.  - Referral to [facility] dietitian for weight loss management

## 2017-06-14 ENCOUNTER — Ambulatory Visit (INDEPENDENT_AMBULATORY_CARE_PROVIDER_SITE_OTHER): Payer: Medicare Other | Admitting: Cardiovascular Disease

## 2017-06-14 ENCOUNTER — Encounter: Payer: Self-pay | Admitting: Cardiovascular Disease

## 2017-06-14 VITALS — BP 114/80 | HR 74 | Ht 62.0 in | Wt 219.0 lb

## 2017-06-14 DIAGNOSIS — J439 Emphysema, unspecified: Secondary | ICD-10-CM | POA: Diagnosis not present

## 2017-06-14 DIAGNOSIS — I5032 Chronic diastolic (congestive) heart failure: Secondary | ICD-10-CM

## 2017-06-14 DIAGNOSIS — I480 Paroxysmal atrial fibrillation: Secondary | ICD-10-CM | POA: Diagnosis not present

## 2017-06-14 DIAGNOSIS — J9611 Chronic respiratory failure with hypoxia: Secondary | ICD-10-CM

## 2017-06-14 DIAGNOSIS — I2510 Atherosclerotic heart disease of native coronary artery without angina pectoris: Secondary | ICD-10-CM | POA: Diagnosis not present

## 2017-06-14 DIAGNOSIS — I10 Essential (primary) hypertension: Secondary | ICD-10-CM | POA: Diagnosis not present

## 2017-06-14 DIAGNOSIS — Z7901 Long term (current) use of anticoagulants: Secondary | ICD-10-CM | POA: Diagnosis not present

## 2017-06-14 DIAGNOSIS — I2721 Secondary pulmonary arterial hypertension: Secondary | ICD-10-CM

## 2017-06-14 DIAGNOSIS — E785 Hyperlipidemia, unspecified: Secondary | ICD-10-CM

## 2017-06-14 NOTE — Progress Notes (Signed)
Internal Medicine Clinic Attending  Case discussed with Dr. Blum at the time of the visit.  We reviewed the resident's history and exam and pertinent patient test results.  I agree with the assessment, diagnosis, and plan of care documented in the resident's note. 

## 2017-06-14 NOTE — Patient Instructions (Signed)
Dr Croitoru recommends that you schedule a follow-up appointment in 6 months. You will receive a reminder letter in the mail two months in advance. If you don't receive a letter, please call our office to schedule the follow-up appointment.  If you need a refill on your cardiac medications before your next appointment, please call your pharmacy. 

## 2017-06-14 NOTE — Progress Notes (Signed)
Cardiology Office Note    Date:  06/16/2017   ID:  Elizabeth [name] 1956-04-22, MRN [id number]  PCP:  Ledell Noss, MD  Cardiologist:   Sanda Klein, MD   Chief Complaint  Patient presents with  . Follow-up    having some chest pain     History of Present Illness:  Elizabeth [name] is a 61 y.o. female coronary artery disease now roughly 12 months after CABG with COPD on chronic oxygen hyperlipidemia, hypertension, chronic diastolic heart failure, remote history of cocaine abuse, pulmonary nodules, lupus and TIA.  Describes herself as doing very well. She has not had any angina. She has occasional brief and very sharp chest discomfort.   Just a couple of weeks ago,  during a hospitalization for "congestive heart failure", hr echocardiogram showed improvement in left ventricular ejection fraction, now up to 50-55%. The echo was interpreted as showing grade 2 diastolic dysfunction, but the mitral E/e' ratio was actually not particularly high, the deceleration time was actually lengthy. Pulmonary artery pressure was markedly elevated at 69 mmHg.  I must say I found the diagnosis of heart failure exacerbation doubtful, though the clinical impression was that she improved with diuretics. There was no CHF radiologically. Her BNP was only mildly higher than baseline (602 versus 525). She received diuretics, but discharge weight was actually 2 pounds higher than admission weight. Her weight today is identical to her hospital admission weight, but she thinks that she is breathing at baseline.  She is not smoking. Still wearing oxygen (2L at rest, 3L activity). She is going to pulmonary rehabilitation. High resolution CT of the lungs in April did not show evidence of interstitial lung disease, but confirmed findings of severe COPD and a dilated pulmonary artery consistent with pulmonary artery hypertension.  Amiodarone, used for postoperative worsening of pre-existing atrial  fibrillation was stopped in November 2017. She has previously received a diagnosis of "lupus". Pulmonary function tests performed before bypass surgery were mildly abnormal with DLCO 44% of predicted, FVC 1.89 (76%), FEV1 1.36 (69%).  She denies exertional dyspnea and has not had angina pectoris, palpitations or any focal neurological complaints. She has mild ankle edema, but denies orthopnea, PND, cough, hemoptysis or any serious bleeding.   She underwent a cardiac cath in 08/2011 in the setting of NSTEMI and cocaine use cath showedchronic total occlusion of the proximal right coronary artery with collateral flow, long 60%-70% stenosis in the mid LAD, left circumflex coronary artery  occluded proximally with a hang-up suggesting acute lesion which received a bare-metal stent. Myoview 09/2015 showed fixed defect involving the inferolateral and anterolateral myocardium consistent with old MI, LV systolic dysfunction (93%).  In July 2017 she presented with unstable angina and nuclear stress test showed the old lateral infarction and anterior ischemia and the cath showed 70-80 percent mid LAD obstruction with high-grade stenoses in both diagonal arteries and 85-90 percent stenosis in the left circumflex and oblique marginal branch downstream of the old stent. EF 40-45%. LVEDP elevated at 22 mm Hg. On 06/08/2016 underwent four-vessel bypass surgery with Dr. Servando Snare: LIMA to LAD, sequential SVG to OM and distal circumflex and SVG to distal RCA. Postop had moderate anemia and recurrent atrial fibrillation requiring amiodarone. She was discharged on home oxygen but is gradually weaning off. She has been receiving home physical therapy. No significant carotid lesions by duplex ultrasound in July 2017.   Past Medical History:  Diagnosis Date  . CAD (coronary artery disease)  NSTEMI 08/2011:  [place] 08/21/11: mLAD 60-70%, pCFX occluded, dRCA chronic occlusion with L-R collats, EF 40-45%, inf AK.  PCI:  BMS to CFX.   Marland Kitchen CHF (congestive heart failure) ([place]) 05/31/2017  . COPD (chronic obstructive pulmonary disease) ([place]) 11/17/2016  . Depression   . Fibroids   . GERD (gastroesophageal reflux disease)   . Gout   . HLD (hyperlipidemia)    Chol = 235, LDL = 156 (08/2010)  . Hypertension   . Lupus 2009   ANA + 10/2008, repeat ANA + (05/2009), on that visit 05/2009 following labs obtained RF <20, CRP <0.4, ANA titers 1:80,   . On home oxygen therapy    "2L; 24/7 (05/31/2017)  . Pulmonary nodules 05/2008   noted on CXR and CT 05/2008, repeat CT 10/2008 - Stable small bilateral pulmonary nodules measuring up to 6 m m  . Stroke [facility]) 04/2014   "mini stroke"; denies residual on 05/31/2017    Past Surgical History:  Procedure Laterality Date  . CARDIAC CATHETERIZATION N/A 06/02/2016   Procedure: Left Heart Cath and Coronary Angiography;  Surgeon: Belva Crome, MD;  Location: [place] [facility];  Service: Cardiovascular;  Laterality: N/A;  . COLONOSCOPY N/A 07/14/2013   Procedure: COLONOSCOPY;  Surgeon: Beryle Beams, MD;  Location: [facility];  Service: Endoscopy;  Laterality: N/A;  . CORONARY ANGIOPLASTY WITH STENT PLACEMENT    . CORONARY ARTERY BYPASS GRAFT N/A 06/08/2016   Procedure: CORONARY ARTERY BYPASS GRAFTING (CABG) X 4  With Right Greater Saphenous vein endoscopic harvesting;  Surgeon: [name], MD;  Location: [place];  Service: Open Heart Surgery;  Laterality: N/A;  . FRACTURE SURGERY    . ORIF TIBIA PLATEAU Left 08/22/2013   Procedure: OPEN REDUCTION INTERNAL FIXATION (ORIF) TIBIAL PLATEAU;  Surgeon: Renette Butters, MD;  Location: [place];  Service: Orthopedics;  Laterality: Left;  . TEE WITHOUT CARDIOVERSION N/A 06/08/2016   Procedure: TRANSESOPHAGEAL ECHOCARDIOGRAM (TEE);  Surgeon: [name], MD;  Location: [place];  Service: Open Heart Surgery;  Laterality: N/A;  . TUBAL LIGATION    . VAGINAL HYSTERECTOMY  05/2003    Current Medications: Outpatient Medications Prior to Visit    Medication Sig Dispense Refill  . albuterol (PROVENTIL HFA;VENTOLIN HFA) 108 (90 Base) MCG/ACT inhaler Inhale 2 puffs into the lungs every 6 (six) hours as needed for wheezing or shortness of breath. 1 Inhaler 2  . apixaban (ELIQUIS) 5 MG TABS tablet Take 1 tablet (5 mg total) by mouth 2 (two) times daily. 60 tablet 4  . atorvastatin (LIPITOR) 80 MG tablet Take 80 mg by mouth daily.  3  . carvedilol (COREG) 12.5 MG tablet TAKE 1 TABLET(12.5 MG) BY MOUTH TWICE DAILY 180 tablet 3  . diclofenac sodium (VOLTAREN) 1 % GEL Apply 4 g topically 4 (four) times daily. 1 Tube 1  . Fluticasone-Umeclidin-Vilant (TRELEGY ELLIPTA) 100-62.5-25 MCG/INH AEPB Inhale 1 puff into the lungs daily. 1 each 5  . furosemide (LASIX) 40 MG tablet Take 2 tablets (80 mg total) by mouth every morning and take 1 tablet (40 mg total) by mouth every evening. 270 tablet 3  . pantoprazole (PROTONIX) 40 MG tablet TAKE 1 TABLET(40 MG) BY MOUTH DAILY 90 tablet 3  . valsartan (DIOVAN) 320 MG tablet Take 320 mg by mouth daily.  11  . Vitamin D, Ergocalciferol, (DRISDOL) 50000 units CAPS capsule Take 1 capsule (50,000 Units total) by mouth every 7 (seven) days. (Patient not taking: Reported on 06/14/2017) 8 capsule 0  No facility-administered medications prior to visit.      Allergies:   Patient has no known allergies.   Social History   Social History  . Marital status: Divorced    Spouse name: N/A  . Number of children: N/A  . Years of education: N/A   Social History Main Topics  . Smoking status: Former Smoker    Packs/day: 0.12    Years: 45.00    Types: Cigarettes    Quit date: 11/16/2016  . Smokeless tobacco: Never Used  . Alcohol use 8.4 oz/week    7 Cans of beer, 7 Standard drinks or equivalent per week     Comment: 1 beer/day  . Drug use: No     Comment: 05/31/2017 "nothing since ~ 2016"  . Sexual activity: No   Other Topics Concern  . None   Social History Narrative  . None     Family History:  The  patient's family history includes Cervical cancer in her sister; Heart disease in her mother; Prostate cancer in her brother.   ROS:   Please see the history of present illness.    ROS All other systems reviewed and are negative.   PHYSICAL EXAM:   VS:  BP 114/80   Pulse 74   Ht _0  (1.575 m)   Wt 219 lb (99.3 kg)   LMP 12/30/1994   BMI 40.06 kg/m    GEN: Well nourished, well developed, in no acute distress   General: Alert, oriented x3, no distress Head: no evidence of trauma, PERRL, EOMI, no exophtalmos or lid lag, no myxedema, no xanthelasma; normal ears, nose and oropharynx Neck: normal jugular venous pulsations and no hepatojugular reflux; brisk carotid pulses without delay and no carotid bruits Chest: clear to auscultation, no signs of consolidation by percussion or palpation, normal fremitus, symmetrical and full respiratory excursions Cardiovascular: normal position and quality of the apical impulse, regular rhythm, normal first and second heart sounds, no murmurs, rubs or gallops Abdomen: no tenderness or distention, no masses by palpation, no abnormal pulsatility or arterial bruits, normal bowel sounds, no hepatosplenomegaly Extremities: no clubbing, cyanosis or edema; 2+ radial, ulnar and brachial pulses bilaterally; 2+ right femoral, posterior tibial and dorsalis pedis pulses; 2+ left femoral, posterior tibial and dorsalis pedis pulses; no subclavian or femoral bruits Neurological: grossly nonfocal  Wt Readings from Last 3 Encounters:  06/14/17 219 lb (99.3 kg)  06/09/17 222 lb 11.2 oz (101 kg)  06/04/17 215 lb 14.4 oz (97.9 kg)      Studies/Labs Reviewed:   EKG:  EKG is ordered today. It shows normal sinus rhythm with T-wave inversion in leads 1, aVL, V4-V6, QTC 470 ms  Recent Labs: 11/26/2016: Magnesium 1.9 05/31/2017: B Natriuretic Peptide 602.4; Hemoglobin 11.0; Platelets 235 06/01/2017: ALT 21 06/03/2017: BUN 21; Creatinine, Ser 1.06; Potassium 4.6; Sodium 138     Lipid Panel    Component Value Date/Time   CHOL 145 03/09/2017 1148   CHOL 236 (H) 08/19/2015 0941   TRIG 104 03/09/2017 1148   HDL 52 03/09/2017 1148   HDL 49 08/19/2015 0941   CHOLHDL 2.8 03/09/2017 1148   VLDL 21 03/09/2017 1148   LDLCALC 72 03/09/2017 1148   LDLCALC 159 (H) 08/19/2015 0941   LDLDIRECT 145.2 10/29/2011 1618    Additional studies/ records that were reviewed today include:  Notes from Pulmonary rehab, PCP appt 7/25, recent hospitalization Echo reviewed   ASSESSMENT:    1. Chronic diastolic CHF (congestive heart failure) ([place])  2. Paroxysmal atrial fibrillation ([place])   3. Long term current use of anticoagulant   4. Atherosclerosis of native coronary artery of native heart without angina pectoris   5. Dyslipidemia   6. Essential hypertension   7. Chronic respiratory failure with hypoxia (HCC)   8. Pulmonary emphysema, unspecified emphysema type ([place])      PLAN:  In order of problems listed above:  1. CHF: Clinically probably euvolemic. Recently hospitalized for heart failure exacerbation, but for reasons described above I'm not entirely convinced this was an accurate diagnosis. She has lateral wall scar by previous nuclear stress testing. Continue treatment with ARB, beta blocker and diuretics. Option to switch from carvedilol to bisoprolol for beta-1 selectivity. 2. AFib: She had atrial fibrillation even before her bypass surgery and has a previous history of TIA. Amiodarone used for increased burden of postop atrial fibrillation, stopped in November. Currently in normal sinus rhythm 3. Anticoagulation: she needs to remain on lifelong anticoagulation. CHADSVasc 6 (TIA 2, gender, hypertension, CAD, CHF). 4. CAD s/p CABG: Recovered well after bypass surgery. No angina pectoris.  5. HLP: On high-dose statin. Target LDL<70 (was 72 in April). 6. HTN:  Well controlled. 7. Hypoxia: Gas exchange abnormalities were documented even before her bypass surgery. Has  a history of previous smoking and COPD changes on radiological studies.  8. COPD: Congratulated on smoking cessation. 9. PAH: While there may be a component of pulmonary venous hypertension causing this, I suspect the major cause of her pulmonary artery hypertension is chronic lung disease.     Medication Adjustments/Labs and Tests Ordered: Current medicines are reviewed at length with the patient today.  Concerns regarding medicines are outlined above.  Medication changes, Labs and Tests ordered today are listed in the Patient Instructions below. Patient Instructions  Dr Sallyanne Kuster recommends that you schedule a follow-up appointment in 6 months. You will receive a reminder letter in the mail two months in advance. If you don't receive a letter, please call our office to schedule the follow-up appointment.  If you need a refill on your cardiac medications before your next appointment, please call your pharmacy.     Signed, Sanda Klein, MD  06/16/2017 3:55 PM    [address], [place], [place]  [address] Phone: [phone]; Fax: [phone]

## 2017-06-15 ENCOUNTER — Encounter (HOSPITAL_COMMUNITY): Payer: Medicare Other

## 2017-06-15 ENCOUNTER — Other Ambulatory Visit: Payer: Self-pay

## 2017-06-15 DIAGNOSIS — I50810 Right heart failure, unspecified: Secondary | ICD-10-CM | POA: Diagnosis not present

## 2017-06-15 DIAGNOSIS — I2510 Atherosclerotic heart disease of native coronary artery without angina pectoris: Secondary | ICD-10-CM | POA: Diagnosis not present

## 2017-06-15 DIAGNOSIS — M109 Gout, unspecified: Secondary | ICD-10-CM | POA: Diagnosis not present

## 2017-06-15 DIAGNOSIS — J439 Emphysema, unspecified: Secondary | ICD-10-CM | POA: Diagnosis not present

## 2017-06-15 DIAGNOSIS — I279 Pulmonary heart disease, unspecified: Secondary | ICD-10-CM | POA: Diagnosis not present

## 2017-06-15 DIAGNOSIS — J984 Other disorders of lung: Secondary | ICD-10-CM | POA: Diagnosis not present

## 2017-06-15 DIAGNOSIS — I255 Ischemic cardiomyopathy: Secondary | ICD-10-CM | POA: Diagnosis not present

## 2017-06-15 DIAGNOSIS — M25562 Pain in left knee: Secondary | ICD-10-CM | POA: Diagnosis not present

## 2017-06-15 DIAGNOSIS — I110 Hypertensive heart disease with heart failure: Secondary | ICD-10-CM | POA: Diagnosis not present

## 2017-06-15 DIAGNOSIS — M1991 Primary osteoarthritis, unspecified site: Secondary | ICD-10-CM | POA: Diagnosis not present

## 2017-06-15 DIAGNOSIS — I480 Paroxysmal atrial fibrillation: Secondary | ICD-10-CM | POA: Diagnosis not present

## 2017-06-15 DIAGNOSIS — M899 Disorder of bone, unspecified: Secondary | ICD-10-CM | POA: Diagnosis not present

## 2017-06-15 DIAGNOSIS — I5043 Acute on chronic combined systolic (congestive) and diastolic (congestive) heart failure: Secondary | ICD-10-CM | POA: Diagnosis not present

## 2017-06-15 NOTE — Patient Outreach (Addendum)
[facility]) Care Management  06/15/2017   Elizabeth [name] 12/18/1955 [id number]  Subjective:  I just got out the hospital a few days ago. I do need to know what happens to cause me to hold the fluids  Objective: Female, 61 years old with history of chronic illnesses.    Current Medications:  Current Outpatient Prescriptions  Medication Sig Dispense Refill  . albuterol (PROVENTIL HFA;VENTOLIN HFA) 108 (90 Base) MCG/ACT inhaler Inhale 2 puffs into the lungs every 6 (six) hours as needed for wheezing or shortness of breath. 1 Inhaler 2  . apixaban (ELIQUIS) 5 MG TABS tablet Take 1 tablet (5 mg total) by mouth 2 (two) times daily. 60 tablet 4  . atorvastatin (LIPITOR) 80 MG tablet Take 80 mg by mouth daily.  3  . carvedilol (COREG) 12.5 MG tablet TAKE 1 TABLET(12.5 MG) BY MOUTH TWICE DAILY 180 tablet 3  . diclofenac sodium (VOLTAREN) 1 % GEL Apply 4 g topically 4 (four) times daily. 1 Tube 1  . DULoxetine (CYMBALTA) 30 MG capsule Take 30 mg by mouth daily.    . Fluticasone-Umeclidin-Vilant (TRELEGY ELLIPTA) 100-62.5-25 MCG/INH AEPB Inhale 1 puff into the lungs daily. 1 each 5  . furosemide (LASIX) 40 MG tablet Take 2 tablets (80 mg total) by mouth every morning and take 1 tablet (40 mg total) by mouth every evening. 270 tablet 3  . pantoprazole (PROTONIX) 40 MG tablet TAKE 1 TABLET(40 MG) BY MOUTH DAILY 90 tablet 3  . valsartan (DIOVAN) 320 MG tablet Take 320 mg by mouth daily.  11   No current facility-administered medications for this visit.     Functional Status:  In your present state of health, do you have any difficulty performing the following activities: 06/09/2017 05/31/2017  Hearing? N N  Vision? N N  Difficulty concentrating or making decisions? Y Y  Comment - "problems remembering"  Walking or climbing stairs? Y Y  Comment - "get out of breath"  Dressing or bathing? Y Y  Doing errands, shopping? N N  Some recent data might be hidden     Fall/Depression Screening: Fall Risk  06/10/2017 06/09/2017 05/03/2017  Falls in the past year? No No No  Number falls in past yr: - - -  Injury with Fall? - - -  Comment - - -  Risk Factor Category  - - -  Risk for fall due to : - - -  Risk for fall due to: Comment - - -  Follow up - - -   PHQ 2/9 Scores 06/10/2017 06/10/2017 06/09/2017 05/03/2017 04/27/2017 01/04/2017 11/17/2016  PHQ - 2 Score 0 1 0 3 2 0 1  PHQ- 9 Score - - - 16 12 - -   [facility] CM Care Plan Problem One     Most Recent Value  Care Plan Problem One  patient states she was in the hospital earlier this month and does not want to go back any time soon.  Role Documenting the Problem One  Care Management [place] for Problem One  Active  [facility] Long Term Goal   patient will have no acute care admission in the next 31 days  [facility] Long Term Goal Start Date  06/15/17  Interventions for Problem One Long Term Goal  7/31 Initial telephone contact with patient  for community care coordination  [facility] CM Short Term Goal #1   Patient will be in attendance to 100% of medical appointments  [facility] CM Short Term Goal #  1 Start Date  06/15/17  Interventions for Short Term Goal #1  7/31 Initial telephone assessmetnt of case managment needs    [facility] CM Care Plan Problem Two     Most Recent Value  Care Plan Problem Two  patient lacks knowledge related to heart failure  Role Documenting the Problem Two  Care Management Coordinator  Care Plan for Problem Two  Active  [facility] CM Short Term Goal #1   In the next 28 days, patient will meet iwth [facility]'s RNCM's for heart failure education  [facility] CM Short Term Goal #1 Start Date  06/15/17  Interventions for Short Term Goal #2   7/31 patient admits to this RNCM she lacks knowledge related to causes of heart failure etiology     Assessment:  Patient had recent hospitalization for shortness of breath, pulmonary edema. Patient reports receiving home health services. Patient is agreeable to home visit for  heart failure education  Plan:  Home visit in the next 21 days for community care coordination, heart failure education

## 2017-06-16 ENCOUNTER — Telehealth: Payer: Self-pay | Admitting: *Deleted

## 2017-06-16 ENCOUNTER — Encounter: Payer: Self-pay | Admitting: *Deleted

## 2017-06-16 ENCOUNTER — Other Ambulatory Visit: Payer: Self-pay | Admitting: Internal Medicine

## 2017-06-16 DIAGNOSIS — I2721 Secondary pulmonary arterial hypertension: Secondary | ICD-10-CM | POA: Insufficient documentation

## 2017-06-16 DIAGNOSIS — J9611 Chronic respiratory failure with hypoxia: Secondary | ICD-10-CM | POA: Insufficient documentation

## 2017-06-16 NOTE — Telephone Encounter (Signed)
Information was sent to CoverMyMeds for [place] for Diclofenac gel 1%.  Approved 06/16/2017 thru 11/15/2017.  Sander Nephew, RN 06/16/2017 9;50 AM.

## 2017-06-17 ENCOUNTER — Encounter (HOSPITAL_COMMUNITY): Payer: Medicare Other

## 2017-06-17 DIAGNOSIS — I255 Ischemic cardiomyopathy: Secondary | ICD-10-CM | POA: Diagnosis not present

## 2017-06-17 DIAGNOSIS — I480 Paroxysmal atrial fibrillation: Secondary | ICD-10-CM | POA: Diagnosis not present

## 2017-06-17 DIAGNOSIS — M25562 Pain in left knee: Secondary | ICD-10-CM | POA: Diagnosis not present

## 2017-06-17 DIAGNOSIS — I2510 Atherosclerotic heart disease of native coronary artery without angina pectoris: Secondary | ICD-10-CM | POA: Diagnosis not present

## 2017-06-17 DIAGNOSIS — J439 Emphysema, unspecified: Secondary | ICD-10-CM | POA: Diagnosis not present

## 2017-06-17 DIAGNOSIS — I279 Pulmonary heart disease, unspecified: Secondary | ICD-10-CM | POA: Diagnosis not present

## 2017-06-17 DIAGNOSIS — M109 Gout, unspecified: Secondary | ICD-10-CM | POA: Diagnosis not present

## 2017-06-17 DIAGNOSIS — I5043 Acute on chronic combined systolic (congestive) and diastolic (congestive) heart failure: Secondary | ICD-10-CM | POA: Diagnosis not present

## 2017-06-17 DIAGNOSIS — I50810 Right heart failure, unspecified: Secondary | ICD-10-CM | POA: Diagnosis not present

## 2017-06-17 DIAGNOSIS — M899 Disorder of bone, unspecified: Secondary | ICD-10-CM | POA: Diagnosis not present

## 2017-06-17 DIAGNOSIS — I110 Hypertensive heart disease with heart failure: Secondary | ICD-10-CM | POA: Diagnosis not present

## 2017-06-17 DIAGNOSIS — J984 Other disorders of lung: Secondary | ICD-10-CM | POA: Diagnosis not present

## 2017-06-17 DIAGNOSIS — M1991 Primary osteoarthritis, unspecified site: Secondary | ICD-10-CM | POA: Diagnosis not present

## 2017-06-18 ENCOUNTER — Other Ambulatory Visit: Payer: Self-pay | Admitting: Pharmacist

## 2017-06-18 DIAGNOSIS — J449 Chronic obstructive pulmonary disease, unspecified: Secondary | ICD-10-CM | POA: Diagnosis not present

## 2017-06-18 DIAGNOSIS — R911 Solitary pulmonary nodule: Secondary | ICD-10-CM | POA: Diagnosis not present

## 2017-06-18 NOTE — Patient Outreach (Signed)
Kisha P Hedberg was referred to pharmacy for medication review. Note that patient was recently discharged from the hospital on 06/04/17. Called and spoke with patient. HIPAA identifiers verified and verbal consent received. Ms. Wilds states that now is not a good time to talk, but asks that I call back on Monday, 8/6, at 9 am to perform this medication review.  Harlow Asa, PharmD, [place] Management [phone]

## 2017-06-21 ENCOUNTER — Other Ambulatory Visit: Payer: Self-pay | Admitting: Pharmacist

## 2017-06-21 ENCOUNTER — Other Ambulatory Visit: Payer: Self-pay | Admitting: Internal Medicine

## 2017-06-21 ENCOUNTER — Telehealth: Payer: Self-pay | Admitting: Internal Medicine

## 2017-06-21 DIAGNOSIS — J439 Emphysema, unspecified: Secondary | ICD-10-CM | POA: Diagnosis not present

## 2017-06-21 DIAGNOSIS — I480 Paroxysmal atrial fibrillation: Secondary | ICD-10-CM | POA: Diagnosis not present

## 2017-06-21 DIAGNOSIS — I255 Ischemic cardiomyopathy: Secondary | ICD-10-CM | POA: Diagnosis not present

## 2017-06-21 DIAGNOSIS — I5043 Acute on chronic combined systolic (congestive) and diastolic (congestive) heart failure: Secondary | ICD-10-CM | POA: Diagnosis not present

## 2017-06-21 DIAGNOSIS — I110 Hypertensive heart disease with heart failure: Secondary | ICD-10-CM | POA: Diagnosis not present

## 2017-06-21 DIAGNOSIS — M109 Gout, unspecified: Secondary | ICD-10-CM | POA: Diagnosis not present

## 2017-06-21 DIAGNOSIS — M899 Disorder of bone, unspecified: Secondary | ICD-10-CM | POA: Diagnosis not present

## 2017-06-21 DIAGNOSIS — I2510 Atherosclerotic heart disease of native coronary artery without angina pectoris: Secondary | ICD-10-CM | POA: Diagnosis not present

## 2017-06-21 DIAGNOSIS — M25562 Pain in left knee: Secondary | ICD-10-CM | POA: Diagnosis not present

## 2017-06-21 DIAGNOSIS — M1991 Primary osteoarthritis, unspecified site: Secondary | ICD-10-CM | POA: Diagnosis not present

## 2017-06-21 DIAGNOSIS — J984 Other disorders of lung: Secondary | ICD-10-CM | POA: Diagnosis not present

## 2017-06-21 DIAGNOSIS — I50810 Right heart failure, unspecified: Secondary | ICD-10-CM | POA: Diagnosis not present

## 2017-06-21 DIAGNOSIS — I279 Pulmonary heart disease, unspecified: Secondary | ICD-10-CM | POA: Diagnosis not present

## 2017-06-21 MED ORDER — NAPROXEN 500 MG PO TABS: 500.0000 mg | ORAL_TABLET | Freq: Every day | ORAL | 0 refills | Status: DC

## 2017-06-21 NOTE — Telephone Encounter (Signed)
   Reason for call:   I received a call from [name] at 1:20 PM indicating that she is having a gout flair and needs a refill of naproxen.   Pertinent Data:   Patient says her gout began to flair yesterday after she ate a lot of red meat.   Her right 1st toe is inflamed and tender.   Naproxen works for these flairs but she is out of the prescription and request more.    Assessment / Plan / Recommendations:   Refilled naproxen 500 mg, advised her not to take this if she develops GI upset as she is also on eliquis   As always, pt is advised that if symptoms worsen or new symptoms arise, they should go to an urgent care facility or to to ER for further evaluation.   Ledell Noss, MD   06/21/2017, 1:12 PM

## 2017-06-21 NOTE — Patient Outreach (Signed)
Elizabeth [name] was referred to pharmacy for medication review. Note that patient was recently discharged from the hospital on 06/04/17. Today call patient as scheduled. HIPAA identifiers verified and verbal consent received. Elizabeth [name] asks that we reschedule for Wednesday, 8/8 at 12 pm.   Note that patient's Vitamin D level was low at her last PCP office visit on 06/09/17 and per note in EPIC, patient was instructed to continue Vitamin D 50,000 units weekly supplementation. Elizabeth [name] reports that she has been taking this medication as directed every 7 days.  Will call to perform a medication review on 8/8 as requested.  Harlow Asa, PharmD, [place] Management [phone]

## 2017-06-22 ENCOUNTER — Encounter (HOSPITAL_COMMUNITY): Payer: Medicare Other

## 2017-06-22 ENCOUNTER — Other Ambulatory Visit: Payer: Self-pay

## 2017-06-22 NOTE — Patient Outreach (Signed)
  [facility]) Care Management  06/22/2017  COBI ALDAPE [birth year] [id number]   This RNCM arrived aat patient's home for this initial home visit. Patient advised this RNCM she did not sleep good last night and did not feel like a visit today.  This RNCM assured this patient that this home visit could be understood. RNCM advised patient she understood.  Patient then asked RNCM to call her later to reschedule this home visit.   Plan: Telephone contact within the next 21 days to reschedule this home visit.

## 2017-06-23 ENCOUNTER — Other Ambulatory Visit: Payer: Self-pay | Admitting: Pharmacist

## 2017-06-23 DIAGNOSIS — M899 Disorder of bone, unspecified: Secondary | ICD-10-CM | POA: Diagnosis not present

## 2017-06-23 DIAGNOSIS — J439 Emphysema, unspecified: Secondary | ICD-10-CM | POA: Diagnosis not present

## 2017-06-23 DIAGNOSIS — J984 Other disorders of lung: Secondary | ICD-10-CM | POA: Diagnosis not present

## 2017-06-23 DIAGNOSIS — I5043 Acute on chronic combined systolic (congestive) and diastolic (congestive) heart failure: Secondary | ICD-10-CM | POA: Diagnosis not present

## 2017-06-23 DIAGNOSIS — I279 Pulmonary heart disease, unspecified: Secondary | ICD-10-CM | POA: Diagnosis not present

## 2017-06-23 DIAGNOSIS — M1991 Primary osteoarthritis, unspecified site: Secondary | ICD-10-CM | POA: Diagnosis not present

## 2017-06-23 DIAGNOSIS — I110 Hypertensive heart disease with heart failure: Secondary | ICD-10-CM | POA: Diagnosis not present

## 2017-06-23 DIAGNOSIS — M109 Gout, unspecified: Secondary | ICD-10-CM | POA: Diagnosis not present

## 2017-06-23 DIAGNOSIS — I50810 Right heart failure, unspecified: Secondary | ICD-10-CM | POA: Diagnosis not present

## 2017-06-23 DIAGNOSIS — M25562 Pain in left knee: Secondary | ICD-10-CM | POA: Diagnosis not present

## 2017-06-23 DIAGNOSIS — I480 Paroxysmal atrial fibrillation: Secondary | ICD-10-CM | POA: Diagnosis not present

## 2017-06-23 DIAGNOSIS — I255 Ischemic cardiomyopathy: Secondary | ICD-10-CM | POA: Diagnosis not present

## 2017-06-23 DIAGNOSIS — I2510 Atherosclerotic heart disease of native coronary artery without angina pectoris: Secondary | ICD-10-CM | POA: Diagnosis not present

## 2017-06-24 ENCOUNTER — Encounter (HOSPITAL_COMMUNITY): Payer: Medicare Other

## 2017-06-25 ENCOUNTER — Encounter: Payer: Self-pay | Admitting: Pulmonary Disease

## 2017-06-25 ENCOUNTER — Ambulatory Visit (INDEPENDENT_AMBULATORY_CARE_PROVIDER_SITE_OTHER): Payer: Medicare Other | Admitting: Pulmonary Disease

## 2017-06-25 VITALS — BP 124/70 | HR 77 | Temp 98.5°F | Ht 62.0 in | Wt 215.0 lb

## 2017-06-25 DIAGNOSIS — J441 Chronic obstructive pulmonary disease with (acute) exacerbation: Secondary | ICD-10-CM | POA: Diagnosis not present

## 2017-06-25 DIAGNOSIS — J449 Chronic obstructive pulmonary disease, unspecified: Secondary | ICD-10-CM | POA: Diagnosis not present

## 2017-06-25 MED ORDER — ALBUTEROL SULFATE (2.5 MG/3ML) 0.083% IN NEBU: 2.5000 mg | INHALATION_SOLUTION | Freq: Four times a day (QID) | RESPIRATORY_TRACT | 5 refills | Status: AC | PRN

## 2017-06-25 MED ORDER — AZITHROMYCIN 250 MG PO TABS: ORAL_TABLET | ORAL | 0 refills | Status: DC

## 2017-06-25 MED ORDER — PREDNISONE 10 MG PO TABS: ORAL_TABLET | ORAL | 0 refills | Status: DC

## 2017-06-25 NOTE — Patient Outreach (Signed)
[facility]) Care Management  Elizabeth [name]   06/25/2017  Elizabeth [name] 1955-12-31 [id number]  Subjective: Elizabeth [name] was referred to pharmacy for medication review. Note that patient was recently discharged from the hospital on 06/04/17. Today call patient as scheduled. Patient asks that I call back after 2 pm. Call back after 2 pm. HIPAA identifiers verified and verbal consent received.  Review with patient each of her medications including name, strength, administration and indication. Patient denies having any medication allergies.  Counsel patient about the importance of rinsing her mouth out after each use of her Trelegy inhaler. Counsel patient to check the expiration date and number of puffs remaining on her albuterol rescue inhaler.  Reports that she stopped taking valsartan one or two months ago after she was told that the medication could cause cancer. Counsel patient on the valsartan recall. Elizabeth [name] verbalizes understanding. However, patient reports that her blood pressure has been normal since she stopped taking the medication and she reports that she told her Cardiologist at her last visit that she has stopped taking this medication. Per note in EPIC from her office visit with Dr.Croitoru on 06/14/17, her blood pressure was 114/80, pulse 74.  Reports that her PCP recently prescribed her naproxen for a gout flare. States that she only took one dose of naproxen as her PCP called her yesterday to counsel her about risk of interaction with Eliquis. Counsel patient about this risk. Patient states that she will avoid taking naproxen again due to this concern.  Elizabeth [name] denies any further medication questions/concerns at this time. Provide patient with my phone number.  Objective:   Encounter Medications: Outpatient Encounter Prescriptions as of 06/23/2017  Medication Sig Note  . albuterol (PROVENTIL HFA;VENTOLIN HFA) 108 (90 Base) MCG/ACT  inhaler Inhale 2 puffs into the lungs every 6 (six) hours as needed for wheezing or shortness of breath.   Marland Kitchen apixaban (ELIQUIS) 5 MG TABS tablet Take 1 tablet (5 mg total) by mouth 2 (two) times daily.   Marland Kitchen atorvastatin (LIPITOR) 80 MG tablet Take 80 mg by mouth daily.   . carvedilol (COREG) 12.5 MG tablet TAKE 1 TABLET(12.5 MG) BY MOUTH TWICE DAILY   . diclofenac sodium (VOLTAREN) 1 % GEL Apply 4 g topically 4 (four) times daily. 06/23/2017: Uses as needed  . Fluticasone-Umeclidin-Vilant (TRELEGY ELLIPTA) 100-62.5-25 MCG/INH AEPB Inhale 1 puff into the lungs daily.   . furosemide (LASIX) 40 MG tablet Take 2 tablets (80 mg total) by mouth every morning and take 1 tablet (40 mg total) by mouth every evening.   . pantoprazole (PROTONIX) 40 MG tablet TAKE 1 TABLET(40 MG) BY MOUTH DAILY   . potassium chloride SA (K-DUR,KLOR-CON) 20 MEQ tablet Take 40 mEq by mouth daily.   . Vitamin D, Ergocalciferol, (DRISDOL) 50000 units CAPS capsule Take 50,000 Units by mouth every 7 (seven) days. 06/23/2017: Taking on Sundays  . naproxen (NAPROSYN) 500 MG tablet Take 1 tablet (500 mg total) by mouth daily. (Patient not taking: Reported on 06/23/2017)    No facility-administered encounter medications on file as of 06/23/2017.      Assessment:  Patient was recently discharged from hospital and all medications have been reviewed.  Drugs sorted by system:  Cardiovascular: Eliquis, atorvastatin, carvedilol, furosemide  Pulmonary/Allergy: albuterol, Trelegy  Gastrointestinal: pantoprazole  Topical: Voltaren  Vitamins/Minerals: potassium chloride, Vitamin D   Duplications in therapy: none noted Drug interactions:  . Eliquis + naproxen: Naproxen may enhance the adverse/toxic effect of Apixaban.  Specifically, the risk for bleeding may be increased. Patient counseled. Patient reports that she has stopped taking naproxen. . Naproxen + Lasix: Nonsteroidal Anti-Inflammatory Agents may diminish the diuretic effect of  Loop Diuretics. Loop Diuretics may enhance the nephrotoxic effect of Nonsteroidal Anti-Inflammatory Agents. Patient reports that she has stopped taking naproxen.  Plan:  No further pharmacy issues noted. Will close pharmacy episode at this time and notify [facility] CM RNCM.  Harlow Asa, PharmD, [place] Management [phone]

## 2017-06-25 NOTE — Progress Notes (Signed)
Current Outpatient Prescriptions on File Prior to Visit  Medication Sig  . albuterol (PROVENTIL HFA;VENTOLIN HFA) 108 (90 Base) MCG/ACT inhaler Inhale 2 puffs into the lungs every 6 (six) hours as needed for wheezing or shortness of breath.  Marland Kitchen apixaban (ELIQUIS) 5 MG TABS tablet Take 1 tablet (5 mg total) by mouth 2 (two) times daily.  Marland Kitchen atorvastatin (LIPITOR) 80 MG tablet Take 80 mg by mouth daily.  . carvedilol (COREG) 12.5 MG tablet TAKE 1 TABLET(12.5 MG) BY MOUTH TWICE DAILY  . diclofenac sodium (VOLTAREN) 1 % GEL Apply 4 g topically 4 (four) times daily.  . Fluticasone-Umeclidin-Vilant (TRELEGY ELLIPTA) 100-62.5-25 MCG/INH AEPB Inhale 1 puff into the lungs daily.  . furosemide (LASIX) 40 MG tablet Take 2 tablets (80 mg total) by mouth every morning and take 1 tablet (40 mg total) by mouth every evening.  . naproxen (NAPROSYN) 500 MG tablet Take 1 tablet (500 mg total) by mouth daily. (Patient not taking: Reported on 06/23/2017)  . pantoprazole (PROTONIX) 40 MG tablet TAKE 1 TABLET(40 MG) BY MOUTH DAILY  . potassium chloride SA (K-DUR,KLOR-CON) 20 MEQ tablet Take 40 mEq by mouth daily.  . valsartan (DIOVAN) 320 MG tablet Take 320 mg by mouth daily.  . Vitamin D, Ergocalciferol, (DRISDOL) 50000 units CAPS capsule Take 50,000 Units by mouth every 7 (seven) days.   No current facility-administered medications on file prior to visit.      Chief Complaint  Patient presents with  . Acute Visit    Pt c/o increased SOB x 3 days. O2 levels are 96%. Pt reports coughing and wheezing with gray mucus production. Pt not taking any OTC meds for cough suppression. Pt has decreased energy levels and appetite.      Past medical history, Past surgical history, Family history, Social history, Allergies all reviewed.  Vital Signs BP 124/70 (BP Location: Right Arm, Cuff Size: Normal)   Pulse 77   Temp 98.5 F (36.9 C) (Oral)   Ht _0  (1.575 m)   Wt 215 lb (97.5 kg)   LMP 12/30/1994   SpO2 96%    BMI 39.32 kg/m   History of Present Illness Elizabeth [name] is a 61 y.o. female with COPD.  She is followed by Dr. Vaughan Browner.  She noticed more trouble with her breathing yesterday.  She feels tight in her chest and has been getting wheeze.  She has felt feverish.  She is coughing and bringing up gray sputum.  She isn't having sinus congestion, skin rash, leg swelling, or abdominal pain.  She is using 2 to 3 liters oxygen.  Physical Exam  General - pleasant, sitting in wheelchair, wearing oxygen ENT - No sinus tenderness, no oral exudate, no LAN Cardiac - s1s2 regular, no murmur Chest - poor air movement, decreased BS, faint b/l wheeze Back - No focal tenderness Abd - Soft, non-tender Ext - No edema Neuro - Normal strength Skin - No rashes Psych - normal mood, and behavior   Assessment/Plan  COPD exacerbation. - will give course of prednisone and zithromax - will arrange for home nebulizer and albuterol - continue trelegy - defer CXR and labs for now   Patient Instructions  Prednisone 10 mg pill >> 3 pills daily for 2 days, 2 pills daily for 2 days, 1 pill daily for 2 days Zithromax 250 mg pill >> 2 pills on day 1, then 1 pill daily  Will arrange for home nebulizer machine  Albuterol one vial nebulized every 4 to 6  hours as needed for cough, wheeze, or chest congestion    Chesley Mires, MD [facility] Pager:  [phone] 06/25/2017, 5:31 PM

## 2017-06-25 NOTE — Patient Instructions (Signed)
Prednisone 10 mg pill >> 3 pills daily for 2 days, 2 pills daily for 2 days, 1 pill daily for 2 days Zithromax 250 mg pill >> 2 pills on day 1, then 1 pill daily  Will arrange for home nebulizer machine  Albuterol one vial nebulized every 4 to 6 hours as needed for cough, wheeze, or chest congestion

## 2017-06-28 DIAGNOSIS — I279 Pulmonary heart disease, unspecified: Secondary | ICD-10-CM | POA: Diagnosis not present

## 2017-06-28 DIAGNOSIS — I110 Hypertensive heart disease with heart failure: Secondary | ICD-10-CM | POA: Diagnosis not present

## 2017-06-28 DIAGNOSIS — M1991 Primary osteoarthritis, unspecified site: Secondary | ICD-10-CM | POA: Diagnosis not present

## 2017-06-28 DIAGNOSIS — I480 Paroxysmal atrial fibrillation: Secondary | ICD-10-CM | POA: Diagnosis not present

## 2017-06-28 DIAGNOSIS — M25562 Pain in left knee: Secondary | ICD-10-CM | POA: Diagnosis not present

## 2017-06-28 DIAGNOSIS — I50810 Right heart failure, unspecified: Secondary | ICD-10-CM | POA: Diagnosis not present

## 2017-06-28 DIAGNOSIS — J984 Other disorders of lung: Secondary | ICD-10-CM | POA: Diagnosis not present

## 2017-06-28 DIAGNOSIS — I255 Ischemic cardiomyopathy: Secondary | ICD-10-CM | POA: Diagnosis not present

## 2017-06-28 DIAGNOSIS — I2510 Atherosclerotic heart disease of native coronary artery without angina pectoris: Secondary | ICD-10-CM | POA: Diagnosis not present

## 2017-06-28 DIAGNOSIS — M899 Disorder of bone, unspecified: Secondary | ICD-10-CM | POA: Diagnosis not present

## 2017-06-28 DIAGNOSIS — J439 Emphysema, unspecified: Secondary | ICD-10-CM | POA: Diagnosis not present

## 2017-06-28 DIAGNOSIS — M109 Gout, unspecified: Secondary | ICD-10-CM | POA: Diagnosis not present

## 2017-06-28 DIAGNOSIS — I5043 Acute on chronic combined systolic (congestive) and diastolic (congestive) heart failure: Secondary | ICD-10-CM | POA: Diagnosis not present

## 2017-06-29 ENCOUNTER — Encounter (HOSPITAL_COMMUNITY): Payer: Medicare Other

## 2017-06-29 DIAGNOSIS — R911 Solitary pulmonary nodule: Secondary | ICD-10-CM | POA: Diagnosis not present

## 2017-06-29 DIAGNOSIS — M329 Systemic lupus erythematosus, unspecified: Secondary | ICD-10-CM | POA: Diagnosis not present

## 2017-06-29 DIAGNOSIS — L932 Other local lupus erythematosus: Secondary | ICD-10-CM | POA: Diagnosis not present

## 2017-06-29 DIAGNOSIS — I2510 Atherosclerotic heart disease of native coronary artery without angina pectoris: Secondary | ICD-10-CM | POA: Diagnosis not present

## 2017-06-29 DIAGNOSIS — J449 Chronic obstructive pulmonary disease, unspecified: Secondary | ICD-10-CM | POA: Diagnosis not present

## 2017-06-30 DIAGNOSIS — I255 Ischemic cardiomyopathy: Secondary | ICD-10-CM | POA: Diagnosis not present

## 2017-06-30 DIAGNOSIS — M1991 Primary osteoarthritis, unspecified site: Secondary | ICD-10-CM | POA: Diagnosis not present

## 2017-06-30 DIAGNOSIS — I50810 Right heart failure, unspecified: Secondary | ICD-10-CM | POA: Diagnosis not present

## 2017-06-30 DIAGNOSIS — J984 Other disorders of lung: Secondary | ICD-10-CM | POA: Diagnosis not present

## 2017-06-30 DIAGNOSIS — I110 Hypertensive heart disease with heart failure: Secondary | ICD-10-CM | POA: Diagnosis not present

## 2017-06-30 DIAGNOSIS — I480 Paroxysmal atrial fibrillation: Secondary | ICD-10-CM | POA: Diagnosis not present

## 2017-06-30 DIAGNOSIS — I279 Pulmonary heart disease, unspecified: Secondary | ICD-10-CM | POA: Diagnosis not present

## 2017-06-30 DIAGNOSIS — M109 Gout, unspecified: Secondary | ICD-10-CM | POA: Diagnosis not present

## 2017-06-30 DIAGNOSIS — I5043 Acute on chronic combined systolic (congestive) and diastolic (congestive) heart failure: Secondary | ICD-10-CM | POA: Diagnosis not present

## 2017-06-30 DIAGNOSIS — M25562 Pain in left knee: Secondary | ICD-10-CM | POA: Diagnosis not present

## 2017-06-30 DIAGNOSIS — I2510 Atherosclerotic heart disease of native coronary artery without angina pectoris: Secondary | ICD-10-CM | POA: Diagnosis not present

## 2017-06-30 DIAGNOSIS — J439 Emphysema, unspecified: Secondary | ICD-10-CM | POA: Diagnosis not present

## 2017-06-30 DIAGNOSIS — M899 Disorder of bone, unspecified: Secondary | ICD-10-CM | POA: Diagnosis not present

## 2017-07-01 ENCOUNTER — Other Ambulatory Visit: Payer: Self-pay

## 2017-07-01 ENCOUNTER — Encounter (HOSPITAL_COMMUNITY): Payer: Medicare Other

## 2017-07-01 NOTE — Patient Outreach (Signed)
     Telephone call to patient for community care coordination and to schedule an appointment for a home visit. Patient requested this RNCM call her back on Monday, August 20 to schedule a home visit. Patient states she would have a better ideal of her schedule for next week.

## 2017-07-02 ENCOUNTER — Ambulatory Visit (INDEPENDENT_AMBULATORY_CARE_PROVIDER_SITE_OTHER): Payer: Medicare Other | Admitting: Internal Medicine

## 2017-07-02 VITALS — BP 149/87 | HR 77 | Temp 98.1°F | Ht 62.0 in | Wt 220.0 lb

## 2017-07-02 DIAGNOSIS — M79604 Pain in right leg: Secondary | ICD-10-CM | POA: Diagnosis not present

## 2017-07-02 DIAGNOSIS — M79605 Pain in left leg: Secondary | ICD-10-CM | POA: Diagnosis not present

## 2017-07-02 DIAGNOSIS — Z79899 Other long term (current) drug therapy: Secondary | ICD-10-CM | POA: Diagnosis not present

## 2017-07-02 DIAGNOSIS — M79661 Pain in right lower leg: Secondary | ICD-10-CM | POA: Diagnosis not present

## 2017-07-02 DIAGNOSIS — M79662 Pain in left lower leg: Secondary | ICD-10-CM | POA: Diagnosis not present

## 2017-07-02 DIAGNOSIS — E785 Hyperlipidemia, unspecified: Secondary | ICD-10-CM | POA: Diagnosis not present

## 2017-07-02 DIAGNOSIS — E559 Vitamin D deficiency, unspecified: Secondary | ICD-10-CM | POA: Diagnosis not present

## 2017-07-02 LAB — BASIC METABOLIC PANEL
Anion gap: 8 (ref 5–15)
BUN: 18 mg/dL (ref 6–20)
CO2: 26 mmol/L (ref 22–32)
Calcium: 9.3 mg/dL (ref 8.9–10.3)
Chloride: 104 mmol/L (ref 101–111)
Creatinine, Ser: 0.9 mg/dL (ref 0.44–1.00)
GFR calc Af Amer: >60 mL/min (ref >60)
GFR calc non Af Amer: >60 mL/min (ref >60)
Glucose, Bld: 85 mg/dL (ref 65–99)
POTASSIUM: 3.9 mmol/L (ref 3.5–5.1)
Sodium: 138 mmol/L (ref 135–145)

## 2017-07-02 LAB — CK: Total CK: 54 U/L (ref 38–234)

## 2017-07-02 MED ORDER — ROSUVASTATIN CALCIUM 20 MG PO TABS: 20.0000 mg | ORAL_TABLET | Freq: Every day | ORAL | 3 refills | Status: AC

## 2017-07-02 NOTE — Patient Instructions (Signed)
Ms. Eckstrom was nice seeing you today.  -Stop taking Lipitor  -Take over-the-counter Tylenol as needed for pain  -Please call the clinic if the pain in your legs does not get better over the weekend.

## 2017-07-02 NOTE — Progress Notes (Signed)
   CC: Pain in legs  HPI:  [name] is a 61 y.o. female with a past medical history of conditions listed below presenting to the clinic complaining of pain in her legs. Please see problem based charting for the status of the patient's current and chronic medical conditions.   Past Medical History:  Diagnosis Date  . CAD (coronary artery disease)    NSTEMI 08/2011:  LHC 08/21/11: mLAD 60-70%, pCFX occluded, dRCA chronic occlusion with L-R collats, EF 40-45%, inf AK.  PCI:  BMS to CFX.  Marland Kitchen CHF (congestive heart failure) ([place]) 05/31/2017  . COPD (chronic obstructive pulmonary disease) ([place]) 11/17/2016  . Depression   . Fibroids   . GERD (gastroesophageal reflux disease)   . Gout   . HLD (hyperlipidemia)    Chol = 235, LDL = 156 (08/2010)  . Hypertension   . Lupus 2009   ANA + 10/2008, repeat ANA + (05/2009), on that visit 05/2009 following labs obtained RF <20, CRP <0.4, ANA titers 1:80,   . On home oxygen therapy    "2L; 24/7 (05/31/2017)  . Pulmonary nodules 05/2008   noted on CXR and CT 05/2008, repeat CT 10/2008 - Stable small bilateral pulmonary nodules measuring up to 6 m m  . Stroke [facility]) 04/2014   "mini stroke"; denies residual on 05/31/2017   Review of Systems: Pertinent positives mentioned in HPI. Remainder of all ROS negative.   Physical Exam:  Vitals:   07/02/17 1350  BP: (!) 149/87  Pulse: 77  Temp: 98.1 F (36.7 C)  TempSrc: Oral  SpO2: 92%  Weight: 220 lb (99.8 kg)  Height: _0  (1.575 m)   Physical Exam  Constitutional: She is oriented to person, place, and time. She appears well-developed and well-nourished.  HENT:  Head: Normocephalic and atraumatic.  Eyes: Right eye exhibits no discharge. Left eye exhibits no discharge.  Cardiovascular: Normal rate, regular rhythm and intact distal pulses.   Pulmonary/Chest: Effort normal and breath sounds normal. No respiratory distress. She has no wheezes. She has no rales.  Abdominal: Soft. Bowel sounds  are normal. She exhibits no distension. There is no tenderness.  Musculoskeletal: She exhibits no edema.  Bilateral lower extremities are symmetric in size. No erythema, increased warmth, or edema. Calves are nontender to palpation. Dorsalis pedis pulse +2 bilaterally. Knees bilaterally: No erythema, increased warmth, or effusion noted. Normal range of motion.  Neurological: She is alert and oriented to person, place, and time.  Skin: Skin is warm and dry.    Assessment & Plan:   See Encounters Tab for problem based charting.  Patient discussed with Dr. Dareen Piano

## 2017-07-02 NOTE — Assessment & Plan Note (Addendum)
History of present illness Patient states she started having bilateral lower extremity pain starting from the knee and going all the way down her lower legs at 1 AM last night. States the pain is severe and woke her up from her sleep. She is not able to describe the character of the pain. The pain is constant and there are no exacerbating or relieving factors. Denies any history of falls or trauma to her legs. Denies any history of prior knee pain. Denies having any back or thigh pain. Denies having any fevers, chills, shortness of breath, or chest chest pain. She is currently taking Lasix 80 mg in the morning and 40 mg at night. Also taking 40 mEq daily of potassium supplement. Her vitamin D level was low when checked on 06/09/2017 and she is currently on a supplement. Patient is also on atorvastatin for hyperlipidemia.   Assessment Unclear etiology of her bilateral lower extremity pain. No signs of DVT on exam. Both lower extremities are symmetric in size and there is no erythema, increased warmth, or edema. Dorsalis pedis pulse +2 bilaterally. Labs at this visit showing normal potassium and CK. Vitamin D level was low previously and is currently being supplemented.  Plan -Over-the-counter Tylenol as needed for pain -Discontinue atorvastatin -Start hydrophilic high intensity statin: Rosuvastatin 20 mg daily. Based on lipid panel done in April 2018, her ASCVD 10 year risk score is 7.7%. -Advised the patient to call the clinic if her lower extremity pain does not improve over the weekend.

## 2017-07-05 ENCOUNTER — Other Ambulatory Visit: Payer: Self-pay

## 2017-07-06 ENCOUNTER — Encounter (HOSPITAL_COMMUNITY): Payer: Medicare Other

## 2017-07-06 NOTE — Patient Outreach (Signed)
    Telephone call to patient, as she requested, to schedule home visit for community care coordination. Patient and this RNCM were able to agree on date for home visit.  Plan: Home visit later this month for community care coordination and chronic disease education.

## 2017-07-08 ENCOUNTER — Encounter (HOSPITAL_COMMUNITY): Payer: Medicare Other

## 2017-07-08 NOTE — Progress Notes (Signed)
Internal Medicine Clinic Attending  Case discussed with Dr. Rathore at the time of the visit.  We reviewed the resident's history and exam and pertinent patient test results.  I agree with the assessment, diagnosis, and plan of care documented in the resident's note. 

## 2017-07-13 ENCOUNTER — Encounter (HOSPITAL_COMMUNITY): Payer: Medicare Other

## 2017-07-14 ENCOUNTER — Other Ambulatory Visit: Payer: Self-pay

## 2017-07-14 NOTE — Patient Outreach (Unsigned)
[facility]) Care Management   07/14/2017  Elizabeth [name] 1956/11/10 [id number]  Elizabeth [name] is an 61 y.o. female  Subjective:   Objective:   ROS  Physical Exam  Encounter Medications:   Outpatient Encounter Prescriptions as of 07/14/2017  Medication Sig Note  . diclofenac sodium (VOLTAREN) 1 % GEL Apply 4 g topically 4 (four) times daily. 06/23/2017: Uses as needed  . albuterol (PROVENTIL HFA;VENTOLIN HFA) 108 (90 Base) MCG/ACT inhaler Inhale 2 puffs into the lungs every 6 (six) hours as needed for wheezing or shortness of breath.   Marland Kitchen albuterol (PROVENTIL) (2.5 MG/3ML) 0.083% nebulizer solution Take 3 mLs (2.5 mg total) by nebulization every 6 (six) hours as needed for wheezing or shortness of breath.   Marland Kitchen apixaban (ELIQUIS) 5 MG TABS tablet Take 1 tablet (5 mg total) by mouth 2 (two) times daily.   Marland Kitchen azithromycin (ZITHROMAX) 250 MG tablet 2 pills on daily 1, then 1 pill daily (Patient not taking: Reported on 07/14/2017)   . carvedilol (COREG) 12.5 MG tablet TAKE 1 TABLET(12.5 MG) BY MOUTH TWICE DAILY   . Fluticasone-Umeclidin-Vilant (TRELEGY ELLIPTA) 100-62.5-25 MCG/INH AEPB Inhale 1 puff into the lungs daily.   . furosemide (LASIX) 40 MG tablet Take 2 tablets (80 mg total) by mouth every morning and take 1 tablet (40 mg total) by mouth every evening.   . naproxen (NAPROSYN) 500 MG tablet Take 1 tablet (500 mg total) by mouth daily. (Patient not taking: Reported on 06/23/2017)   . pantoprazole (PROTONIX) 40 MG tablet TAKE 1 TABLET(40 MG) BY MOUTH DAILY   . potassium chloride SA (K-DUR,KLOR-CON) 20 MEQ tablet Take 40 mEq by mouth daily.   . predniSONE (DELTASONE) 10 MG tablet 3 pills daily for 2 days, 2 pills daily for 2 days, 1 pill daily for 2 days (Patient not taking: Reported on 07/14/2017)   . rosuvastatin (CRESTOR) 20 MG tablet Take 1 tablet (20 mg total) by mouth at bedtime. (Patient not taking: Reported on 07/14/2017)   . valsartan (DIOVAN) 320 MG  tablet Take 320 mg by mouth daily.   . Vitamin D, Ergocalciferol, (DRISDOL) 50000 units CAPS capsule Take 50,000 Units by mouth every 7 (seven) days. 06/23/2017: Taking on Sundays   No facility-administered encounter medications on file as of 07/14/2017.     Functional Status:   In your present state of health, do you have any difficulty performing the following activities: 07/02/2017 06/09/2017  Hearing? N N  Vision? N N  Difficulty concentrating or making decisions? N Y  Comment - -  Walking or climbing stairs? Y Y  Comment LEG PAIN -  Dressing or bathing? Y Y  Doing errands, shopping? Y N  Some recent data might be hidden    Fall/Depression Screening:    Fall Risk  07/14/2017 07/02/2017 06/10/2017  Falls in the past year? No No No  Number falls in past yr: - - -  Injury with Fall? - - -  Comment - - -  Risk Factor Category  - - -  Risk for fall due to : Impaired balance/gait;Impaired mobility;Impaired vision;Medication side effect - -  Risk for fall due to: Comment - - -  Follow up - - -   PHQ 2/9 Scores 07/14/2017 07/02/2017 06/10/2017 06/10/2017 06/09/2017 05/03/2017 04/27/2017  PHQ - 2 Score 0 0 0 1 0 3 2  PHQ- 9 Score - - - - - 16 12    Assessment:    Plan:

## 2017-07-15 ENCOUNTER — Encounter (HOSPITAL_COMMUNITY): Payer: Medicare Other

## 2017-07-19 DIAGNOSIS — R911 Solitary pulmonary nodule: Secondary | ICD-10-CM | POA: Diagnosis not present

## 2017-07-19 DIAGNOSIS — J449 Chronic obstructive pulmonary disease, unspecified: Secondary | ICD-10-CM | POA: Diagnosis not present

## 2017-07-20 ENCOUNTER — Ambulatory Visit (HOSPITAL_BASED_OUTPATIENT_CLINIC_OR_DEPARTMENT_OTHER): Payer: Medicare Other

## 2017-07-20 ENCOUNTER — Encounter (HOSPITAL_COMMUNITY): Payer: Medicare Other

## 2017-07-20 ENCOUNTER — Encounter (HOSPITAL_COMMUNITY): Payer: Self-pay

## 2017-07-20 NOTE — Progress Notes (Signed)
Discharge Progress Report  Patient Details  Name: Elizabeth [name] MRN: [id number] Date of Birth: 1956/03/05 Referring Provider:     Pulmonary Rehab Walk Test from 05/04/2017 in [place]  Referring Provider  Dr. Vaughan Browner       Number of Visits: 3  Reason for Discharge:  Early Exit:  change in medical status-hospitalized and recieving HH at discharge  Smoking History:  History  Smoking Status  . Former Smoker  . Packs/day: 0.12  . Years: 45.00  . Types: Cigarettes  . Quit date: 11/16/2016  Smokeless Tobacco  . Never Used    Diagnosis:  No diagnosis found.  ADL UCSD:   Initial Exercise Prescription:   Discharge Exercise Prescription (Final Exercise Prescription Changes):   Functional Capacity:   Psychological, QOL, Others - Outcomes: PHQ 2/9: Depression screen [facility] 2/9 07/14/2017 07/02/2017 06/10/2017 06/10/2017 06/09/2017  Decreased Interest 0 0 - 1 0  Down, Depressed, Hopeless 0 0 0 0 0  PHQ - 2 Score 0 0 0 1 0  Altered sleeping - - - - -  Tired, decreased energy - - - - -  Change in appetite - - - - -  Feeling bad or failure about yourself  - - - - -  Trouble concentrating - - - - -  Moving slowly or fidgety/restless - - - - -  Suicidal thoughts - - - - -  PHQ-9 Score - - - - -  Difficult doing work/chores - - - - -  Some recent data might be hidden    Quality of Life:   Personal Goals: Goals established at orientation with interventions provided to work toward goal.    Personal Goals Discharge:   Exercise Goals and Review:   Nutrition & Weight - Outcomes:    Nutrition:     Nutrition Therapy & Goals - 05/28/17 1422      Nutrition Therapy   Diet Therapeutic Lifestyle Changes     Personal Nutrition Goals   Nutrition Goal Identify food quantities necessary to achieve wt loss of  -2# per week to a goal wt loss of 2.7-10.9 kg (6-24 lb) at graduation from pulmonary rehab.   Personal Goal #2 Pt to be able  to identify foods that affect blood glucose.      Intervention Plan   Intervention Prescribe, educate and counsel regarding individualized specific dietary modifications aiming towards targeted core components such as weight, hypertension, lipid management, diabetes, heart failure and other comorbidities.   Expected Outcomes Short Term Goal: Understand basic principles of dietary content, such as calories, fat, sodium, cholesterol and nutrients.;Long Term Goal: Adherence to prescribed nutrition plan.      Nutrition Discharge:     Nutrition Assessments - 05/28/17 1422      Rate Your Plate Scores   Pre Score 41      Education Questionnaire Score:   Goals unmet

## 2017-07-21 ENCOUNTER — Telehealth: Payer: Self-pay | Admitting: Cardiovascular Disease

## 2017-07-21 NOTE — Telephone Encounter (Signed)
The patient called in with complaints of chest pain and a fluttering feeling that radiates down her left arm. She stated that her left arm is swollen. This has been going on for several days. She stated that she was going to head to the emergency department and wanted the office to know. She has been advised that this was the best course of action. She verbalized her understanding.

## 2017-07-21 NOTE — Telephone Encounter (Signed)
New Message     Pt c/o of Chest Pain: STAT if CP now or developed within 24 hours  1. Are you having CP right now? A twinge of pain, left arm pain and its swelling and flutter feeling   2. Are you experiencing any other symptoms (ex. SOB, nausea, vomiting, sweating)?   no  3. How long have you been experiencing CP?  A few days  4. Is your CP continuous or coming and going?  continuous  5. Have you taken Nitroglycerin? no ?

## 2017-07-22 ENCOUNTER — Encounter (HOSPITAL_COMMUNITY): Payer: Medicare Other

## 2017-07-27 ENCOUNTER — Encounter (HOSPITAL_COMMUNITY): Payer: Medicare Other

## 2017-07-28 DIAGNOSIS — I2510 Atherosclerotic heart disease of native coronary artery without angina pectoris: Secondary | ICD-10-CM | POA: Diagnosis not present

## 2017-07-28 DIAGNOSIS — R911 Solitary pulmonary nodule: Secondary | ICD-10-CM | POA: Diagnosis not present

## 2017-07-28 DIAGNOSIS — I5089 Other heart failure: Secondary | ICD-10-CM | POA: Diagnosis not present

## 2017-07-28 DIAGNOSIS — L932 Other local lupus erythematosus: Secondary | ICD-10-CM | POA: Diagnosis not present

## 2017-07-29 ENCOUNTER — Encounter (HOSPITAL_COMMUNITY): Payer: Medicare Other

## 2017-07-30 DIAGNOSIS — M329 Systemic lupus erythematosus, unspecified: Secondary | ICD-10-CM | POA: Diagnosis not present

## 2017-07-30 DIAGNOSIS — J449 Chronic obstructive pulmonary disease, unspecified: Secondary | ICD-10-CM | POA: Diagnosis not present

## 2017-08-02 ENCOUNTER — Telehealth: Payer: Self-pay | Admitting: Cardiovascular Disease

## 2017-08-02 NOTE — Telephone Encounter (Signed)
Returned call to [employer] with [facility].Spoke to [place] I will let Dr.Croitoru know patient is refusing home health care.

## 2017-08-02 NOTE — Telephone Encounter (Signed)
Understood. DC patient from [place]

## 2017-08-02 NOTE — Telephone Encounter (Signed)
New message     Needs verbal order they can discharge patient, patient is refusing all visits from them

## 2017-08-03 ENCOUNTER — Encounter (HOSPITAL_COMMUNITY): Payer: Medicare Other

## 2017-08-05 ENCOUNTER — Telehealth: Payer: Self-pay | Admitting: Pulmonary Disease

## 2017-08-05 ENCOUNTER — Encounter (HOSPITAL_COMMUNITY): Payer: Medicare Other

## 2017-08-05 NOTE — Telephone Encounter (Signed)
Patient is returning phone call.

## 2017-08-05 NOTE — Telephone Encounter (Signed)
LMTCB

## 2017-08-05 NOTE — Telephone Encounter (Signed)
Called and spoke with pt. Pt reports of increased sob, prod cough white mucus & wheezing x3d. Pt currently on 2L cont. Pt states that she is waking gasping for air.  Pt is scheduled for acute visit with TP on 08/06/17. Pt declined apt for today due to transportation per [name].   PM please advise. Thanks.

## 2017-08-05 NOTE — Telephone Encounter (Signed)
Call in prednisone taper starting at 40 mg. Reduce dose by 10 mg every 3 days. I am not sure if she will need antibiotics as well. Tell her to keep the appointment with TP tomorrow to evaluate.  Marshell Garfinkel MD [place] 08/05/2017, 2:43 PM

## 2017-08-05 NOTE — Telephone Encounter (Signed)
ATC her back x 3- someone picks up but will not speak  [facility]

## 2017-08-05 NOTE — Telephone Encounter (Signed)
Pt returning call and can be reached @ [phone] pt said she has appoint in the morning and would see [place] then no need to call back.Hillery Hunter

## 2017-08-06 ENCOUNTER — Encounter: Payer: Self-pay | Admitting: Adult Health

## 2017-08-06 ENCOUNTER — Ambulatory Visit (INDEPENDENT_AMBULATORY_CARE_PROVIDER_SITE_OTHER)
Admission: RE | Admit: 2017-08-06 | Discharge: 2017-08-06 | Disposition: A | Discharge status: Home / Self Care | Payer: Medicare Other | Source: Ambulatory Visit | Attending: Adult Health | Admitting: Adult Health

## 2017-08-06 ENCOUNTER — Other Ambulatory Visit (INDEPENDENT_AMBULATORY_CARE_PROVIDER_SITE_OTHER): Payer: Medicare Other

## 2017-08-06 ENCOUNTER — Ambulatory Visit (INDEPENDENT_AMBULATORY_CARE_PROVIDER_SITE_OTHER): Payer: Medicare Other | Admitting: Adult Health

## 2017-08-06 VITALS — BP 122/82 | HR 75 | Ht 62.0 in | Wt 225.8 lb

## 2017-08-06 DIAGNOSIS — J439 Emphysema, unspecified: Secondary | ICD-10-CM

## 2017-08-06 DIAGNOSIS — I2721 Secondary pulmonary arterial hypertension: Secondary | ICD-10-CM

## 2017-08-06 DIAGNOSIS — J441 Chronic obstructive pulmonary disease with (acute) exacerbation: Secondary | ICD-10-CM

## 2017-08-06 DIAGNOSIS — R0600 Dyspnea, unspecified: Secondary | ICD-10-CM

## 2017-08-06 DIAGNOSIS — R05 Cough: Secondary | ICD-10-CM | POA: Diagnosis not present

## 2017-08-06 DIAGNOSIS — J9611 Chronic respiratory failure with hypoxia: Secondary | ICD-10-CM

## 2017-08-06 LAB — CBC WITH DIFFERENTIAL/PLATELET
BASOS PCT: 1 % (ref 0.0–3.0)
Basophils Absolute: 0.1 10*3/uL (ref 0.0–0.1)
EOS PCT: 0.4 % (ref 0.0–5.0)
Eosinophils Absolute: 0 10*3/uL (ref 0.0–0.7)
HCT: 36.9 % (ref 36.0–46.0)
Hemoglobin: 11.3 g/dL — ABNORMAL LOW (ref 12.0–15.0)
LYMPHS ABS: 1.4 10*3/uL (ref 0.7–4.0)
Lymphocytes Relative: 21.2 % (ref 12.0–46.0)
MCHC: 30.6 g/dL (ref 30.0–36.0)
MCV: 83.8 fl (ref 78.0–100.0)
MONO ABS: 0.6 10*3/uL (ref 0.1–1.0)
Monocytes Relative: 9.1 % (ref 3.0–12.0)
NEUTROS ABS: 4.5 10*3/uL (ref 1.4–7.7)
NEUTROS PCT: 68.3 % (ref 43.0–77.0)
PLATELETS: 218 10*3/uL (ref 150.0–400.0)
RBC: 4.4 Mil/uL (ref 3.87–5.11)
RDW: 19 % — AB (ref 11.5–15.5)
WBC: 6.6 10*3/uL (ref 4.0–10.5)

## 2017-08-06 LAB — BASIC METABOLIC PANEL
BUN: 16 mg/dL (ref 6–23)
CALCIUM: 9.6 mg/dL (ref 8.4–10.5)
CO2: 26 mEq/L (ref 19–32)
CREATININE: 0.85 mg/dL (ref 0.40–1.20)
Chloride: 102 mEq/L (ref 96–112)
GFR: 87.79 mL/min (ref >60.00)
Glucose, Bld: 114 mg/dL — ABNORMAL HIGH (ref 70–99)
Potassium: 3.5 mEq/L (ref 3.5–5.1)
SODIUM: 139 meq/L (ref 135–145)

## 2017-08-06 LAB — BRAIN NATRIURETIC PEPTIDE: PRO B NATRI PEPTIDE: 2160 pg/mL — AB (ref 0.0–100.0)

## 2017-08-06 MED ORDER — PREDNISONE 10 MG PO TABS: ORAL_TABLET | ORAL | 0 refills | Status: DC

## 2017-08-06 MED ORDER — AMOXICILLIN-POT CLAVULANATE 875-125 MG PO TABS: 1.0000 | ORAL_TABLET | Freq: Two times a day (BID) | ORAL | 0 refills | Status: AC

## 2017-08-06 NOTE — Assessment & Plan Note (Signed)
Pt has underlying PAH with D CHF , OSA, COPD  Will check BNP , Cont on diuretics.  Keep on o2 to keep sats >88-90%.  Check sleep study , may need CPAP   Plan  Patient Instructions  Chest x-ray today. Labs today .  Set up for sleep study.  Continue on TRELEGY 1 puff daily , rinse after use  Continue on Oxygen 2l/m .  Augmentin 873m Twice daily  For 1 week , take with food.  Predniosne taper over next week .  Follow up with Dr. MVaughan Brownerin 4-6 weeks and As needed   Please contact office for sooner follow up if symptoms do not improve or worsen or seek emergency care

## 2017-08-06 NOTE — Assessment & Plan Note (Addendum)
Slow to resolve flare - tx w/ abx and steroids  Check cxr  And chest xray .   Plan  Patient Instructions  Chest x-ray today. Labs today .  Set up for sleep study.  Continue on TRELEGY 1 puff daily , rinse after use  Continue on Oxygen 2l/m .  Augmentin 82m Twice daily  For 1 week , take with food.  Predniosne taper over next week .  Follow up with Dr. MVaughan Brownerin 4-6 weeks and As needed   Please contact office for sooner follow up if symptoms do not improve or worsen or seek emergency care

## 2017-08-06 NOTE — Patient Instructions (Addendum)
Chest x-ray today. Labs today .  Set up for sleep study.  Continue on TRELEGY 1 puff daily , rinse after use  Continue on Oxygen 2l/m .  Augmentin 823m Twice daily  For 1 week , take with food.  Predniosne taper over next week .  Follow up with Dr. MVaughan Brownerin 4-6 weeks and As needed   Please contact office for sooner follow up if symptoms do not improve or worsen or seek emergency care

## 2017-08-06 NOTE — Progress Notes (Signed)
_0  ID: Elizabeth [name], female    DOB: 09-01-1956, 61 y.o.   MRN: [id number]  Chief Complaint  Patient presents with  . Acute Visit    COPD exacerbation    Referring provider: Ledell Noss, MD  HPI: 61 yo female former smoker followed for COPD  CAD s/p CABG, A Fib  , CHF , PAH .   TEST  PFT July 2017 showed moderate COPD with this FEV1 at 62%, ratio 62, FVC 78%, positive bronchodilator response, DLCO 44% Echo 05/2017 LVH, EF 50-55%, Gr 2 DD , PAP 69 mmHg  CT chest 05/2017 >neg PE , CM , emphysema  Sleep study Mild OSA in 2013   08/06/2017 Acute OV : COPD  Patient presents for an acute office visit. Complains of one month of increased cough, congestion, thick mucus, intermittent wheezing. Coughing up thick brown mucus. He was given a Z-Pak  And prednisone taper one month ago without much improvement. Takes Trelegy daily . Uses albuterol As needed  . Still coughing   She remains on On oxygen 2l/m .Marland Kitchen  She has Pulmonary HTN , increased PAP on last echo in July at 26mHg .  Wt is 50lbs over last 10 yr. She does carry dx of mild OSA in past with  Daytime sleepiness, nocturnal hypoxia . Never been on CPAP .  She has CAD, A Fib on Eliquis .     No Known Allergies  Immunization History  Administered Date(s) Administered  . Influenza Split 09/26/2012, 01/04/2017  . Influenza Whole 08/14/2009  . Influenza,inj,Quad PF,6+ Mos 08/16/2013, 08/19/2015  . Pneumococcal Polysaccharide-23 11/25/2016  . Tdap 11/18/2011    Past Medical History:  Diagnosis Date  . CAD (coronary artery disease)    NSTEMI 08/2011:  LHC 08/21/11: mLAD 60-70%, pCFX occluded, dRCA chronic occlusion with L-R collats, EF 40-45%, inf AK.  PCI:  BMS to CFX.  .Marland KitchenCHF (congestive heart failure) ([place] 05/31/2017  . [place] 11/17[place]  . Depression   . Fibroids   . GERD (gastroesophageal reflux disease)   . Gout   . HLD (hyperlipidemia)    Chol = 235, LDL = 156  (08/2010)  . Hypertension   . Lupus 2009   ANA + 10/2008, repeat ANA + (05/2009), on that visit 05/2009 following labs obtained RF <20, CRP <0.4, ANA titers 1:80,   . On home oxygen therapy    "2L; 24/7 (05/31/2017)  . Pulmonary nodules 05/2008   noted on CXR and CT 05/2008, repeat CT 10/2008 - Stable small bilateral pulmonary nodules measuring up to 6 m m  . Stroke ([facility] 04/2014   "mini stroke"; denies residual on 05/31/2017    Tobacco History: History  Smoking Status  . Former Smoker  . Packs/day: 0.12  . Years: 45.00  . Types: Cigarettes  . Quit date: 11/16/2016  Smokeless Tobacco  . Never Used   Counseling given: Not Answered   Outpatient Encounter Prescriptions as of 08/06/2017  Medication Sig  . albuterol (PROVENTIL HFA;VENTOLIN HFA) 108 (90 Base) MCG/ACT inhaler Inhale 2 puffs into the lungs every 6 (six) hours as needed for wheezing or shortness of breath.  .Marland Kitchenalbuterol (PROVENTIL) (2.5 MG/3ML) 0.083% nebulizer solution Take 3 mLs (2.5 mg total) by nebulization every 6 (six) hours as needed for wheezing or shortness of breath.  .Marland Kitchenapixaban (ELIQUIS) 5 MG TABS tablet Take 1 tablet (5 mg total) by mouth 2 (two) times daily.  . carvedilol (COREG) 12.5 MG tablet TAKE 1  TABLET(12.5 MG) BY MOUTH TWICE DAILY  . diclofenac sodium (VOLTAREN) 1 % GEL Apply 4 g topically 4 (four) times daily.  . Fluticasone-Umeclidin-Vilant (TRELEGY ELLIPTA) 100-62.5-25 MCG/INH AEPB Inhale 1 puff into the lungs daily.  . furosemide (LASIX) 40 MG tablet Take 2 tablets (80 mg total) by mouth every morning and take 1 tablet (40 mg total) by mouth every evening.  . naproxen (NAPROSYN) 500 MG tablet Take 1 tablet (500 mg total) by mouth daily.  . pantoprazole (PROTONIX) 40 MG tablet TAKE 1 TABLET(40 MG) BY MOUTH DAILY  . potassium chloride SA (K-DUR,KLOR-CON) 20 MEQ tablet Take 40 mEq by mouth daily.  . rosuvastatin (CRESTOR) 20 MG tablet Take 1 tablet (20 mg total) by mouth at bedtime.  . valsartan (DIOVAN)  320 MG tablet Take 320 mg by mouth daily.  . Vitamin D, Ergocalciferol, (DRISDOL) 50000 units CAPS capsule Take 50,000 Units by mouth every 7 (seven) days.  Marland Kitchen amoxicillin-clavulanate (AUGMENTIN) 875-125 MG tablet Take 1 tablet by mouth 2 (two) times daily.  . predniSONE (DELTASONE) 10 MG tablet 4 tabs for 2 days, then 3 tabs for 2 days, 2 tabs for 2 days, then 1 tab for 2 days, then stop  . [DISCONTINUED] azithromycin (ZITHROMAX) 250 MG tablet 2 pills on daily 1, then 1 pill daily (Patient not taking: Reported on 07/14/2017)  . [DISCONTINUED] predniSONE (DELTASONE) 10 MG tablet 3 pills daily for 2 days, 2 pills daily for 2 days, 1 pill daily for 2 days (Patient not taking: Reported on 08/06/2017)   No facility-administered encounter medications on file as of 08/06/2017.      Review of Systems  Constitutional:   No  weight loss, night sweats,  Fevers, chills, +fatigue, or  lassitude.  HEENT:   No headaches,  Difficulty swallowing,  Tooth/dental problems, or  Sore throat,                No sneezing, itching, ear ache, nasal congestion, post nasal drip,   CV:  No chest pain,  Orthopnea, PND, swelling in lower extremities, anasarca, dizziness, palpitations, syncope.   GI  No heartburn, indigestion, abdominal pain, nausea, vomiting, diarrhea, change in bowel habits, loss of appetite, bloody stools.   Resp:   No chest wall deformity  Skin: no rash or lesions.  GU: no dysuria, change in color of urine, no urgency or frequency.  No flank pain, no hematuria   MS:  No joint pain or swelling.  No decreased range of motion.  No back pain.    Physical Exam  BP 122/82 (BP Location: Left Arm)   Pulse 75   Ht _0  (1.575 m)   Wt 225 lb 12.8 oz (102.4 kg)   LMP 12/30/1994   SpO2 92%   BMI 41.30 kg/m   GEN: A/Ox3; pleasant , NAD , chronically ill appearing on o2 in wc    HEENT:  [place]/AT,  EACs-clear, TMs-wnl, NOSE-clear, THROAT-clear, no lesions, no postnasal drip or exudate noted.   NECK:   Supple w/ fair ROM; no JVD; normal carotid impulses w/o bruits; no thyromegaly or nodules palpated; no lymphadenopathy.    RESP  Few trace rhonchi  no accessory muscle use, no dullness to percussion  CARD:  RRR, no m/r/g, tr peripheral edema, pulses intact, no cyanosis or clubbing.  GI:   Soft & nt; nml bowel sounds; no organomegaly or masses detected.   Musco: Warm bil, no deformities or joint swelling noted.   Neuro: alert, no focal deficits noted.  Skin: Warm, no lesions or rashes    Lab Results:   BMET   BNP  Imaging: No results found.   Assessment & Plan:   COPD (chronic obstructive pulmonary disease) (HCC) Slow to resolve flare - tx w/ abx and steroids  Check cxr  And chest xray .   Plan  Patient Instructions  Chest x-ray today. Labs today .  Set up for sleep study.  Continue on TRELEGY 1 puff daily , rinse after use  Continue on Oxygen 2l/m .  Augmentin 833m Twice daily  For 1 week , take with food.  Predniosne taper over next week .  Follow up with Dr. MVaughan Brownerin 4-6 weeks and As needed   Please contact office for sooner follow up if symptoms do not improve or worsen or seek emergency care         [place] respiratory [place] with [place] .    PAH (pulmonary artery hypertension) (HCC) Pt has underlying PAH with D CHF , OSA, COPD  Will check BNP , Cont on diuretics.  Keep on o2 to keep sats >88-90%.  Check sleep study , may need CPAP   Plan  Patient Instructions  Chest x-ray today. Labs today .  Set up for sleep study.  Continue on TRELEGY 1 puff daily , rinse after use  Continue on Oxygen 2l/m .  Augmentin 8724mTwice daily  For 1 week , take with food.  Predniosne taper over next week .  Follow up with Dr. MaVaughan Brownern 4-6 weeks and As needed   Please contact office for sooner follow up if symptoms do not improve or worsen or seek emergency care             TaRexene EdisonNP 08/06/2017

## 2017-08-06 NOTE — Assessment & Plan Note (Signed)
Cont on O2 .  

## 2017-08-10 ENCOUNTER — Encounter (HOSPITAL_COMMUNITY): Payer: Medicare Other

## 2017-08-10 ENCOUNTER — Ambulatory Visit (HOSPITAL_BASED_OUTPATIENT_CLINIC_OR_DEPARTMENT_OTHER): Payer: Medicare Other

## 2017-08-12 ENCOUNTER — Other Ambulatory Visit: Payer: Self-pay | Admitting: *Deleted

## 2017-08-12 ENCOUNTER — Encounter (HOSPITAL_COMMUNITY): Payer: Medicare Other

## 2017-08-12 ENCOUNTER — Encounter: Payer: Self-pay | Admitting: *Deleted

## 2017-08-12 NOTE — Patient Outreach (Signed)
[place] [facility]) Care Management New Home Telephone Outreach  08/12/2017  Elizabeth [name] Aug 05, 1956 [id number]  Successful outgoing telephone outreach to Elizabeth [name],  61 y/o female followed by [facility] after recent hospitalization July 16-20, 2018 for CHF exacerbation.  HIPAA/ identity verified with patient during phone call today, and I explained that I was contacting patient in absence of previous [facility] RN CM; patient provides verbal agreement to speak with her today by phone.  Today, patient reports that she "is having a really good day," and she denies pain, problems, or concerns.  Patient sounds to be in no apparent/ obvious distress throughout today's phone call.  Patient further reports:  -- Has all medications and takes as prescribed; reports that she has almost completed antibiotics and prednisone that were prescribed at time of last pulmonary provider appointment on August 06, 2017.  Denies questions/ concerns with medications today. -- Reports that she has spoken with office staff at her "doctors office" this morning who advised her to increase/ change her Lasix dosing; patient reports that she was told to increase lasix to "3 pills in the morning and 1 pill in the evening;"  Patient reports that she has started this new regimine "this morning." -- Continues wearing O2 at home at 2 L/min; states that she "hopes to get off the O2 permanently" in the near future and that she is working with her doctors with goal -- Reports supportive family network that actively participate in patient's care needs as indicated; reports that she lives with her sister and her niece, and that family transport her to all scheduled provider appointments.  Denies [employer] needs today. -- Reports that she is essentially independent in her self- care for ADL's -- Denies recent falls, use of assistive devices for ambulation  Discussed with patient that I  would like to make an appointment for [place] home visit to meet her and discuss her goals around her health care issues, and patient responded that she is unable to schedule a home visit, as she is "leaving next week to go to [place] to visit" her daughter; patient reports that she is unsure how long she will be out of town, and states that she will "be gone for at least a month, maybe longer."  Discussed case closure with patient given her travel plans, and explained that if/ when she returns to [place], that [facility] services could be resumed at that time, and patient verbalizes understanding and agreement with this plan.    Patient denies further issues, concerns, or problems today.  I provided patient with my direct phone number, the main [facility] CM office phone number, and the [facility] CM 24-hour nurse advice phone number should issues arise in the future, or should she wish to resume [facility] CM services.  Plan:  Will make [facility] CMA and patient's PCP aware of [facility] CM case closure, due to patient's upcoming extended travel plans  Oneta Rack, RN, BSN, [employer] Coordinator [facility] Care Management  [phone]

## 2017-08-13 ENCOUNTER — Encounter: Payer: Self-pay | Admitting: *Deleted

## 2017-08-17 ENCOUNTER — Encounter (HOSPITAL_COMMUNITY): Payer: Medicare Other

## 2017-08-18 DIAGNOSIS — J449 Chronic obstructive pulmonary disease, unspecified: Secondary | ICD-10-CM | POA: Diagnosis not present

## 2017-08-18 DIAGNOSIS — R911 Solitary pulmonary nodule: Secondary | ICD-10-CM | POA: Diagnosis not present

## 2017-08-19 ENCOUNTER — Encounter (HOSPITAL_COMMUNITY): Payer: Medicare Other

## 2017-08-24 ENCOUNTER — Telehealth: Payer: Self-pay | Admitting: Pulmonary Disease

## 2017-08-24 ENCOUNTER — Telehealth (HOSPITAL_COMMUNITY): Payer: Self-pay

## 2017-08-24 DIAGNOSIS — J449 Chronic obstructive pulmonary disease, unspecified: Secondary | ICD-10-CM

## 2017-08-24 NOTE — Telephone Encounter (Signed)
Hannah from pulm rehab states that pt is wanting to start pulm rehab. PM ok to place order?  Thanks!

## 2017-08-24 NOTE — Telephone Encounter (Signed)
Order for pulm rehab has been placed.

## 2017-08-24 NOTE — Telephone Encounter (Signed)
Yes. Ok to place order for pulmonary rehab

## 2017-08-24 NOTE — Telephone Encounter (Signed)
Called Dr.Mannam's office. [place] office will request order to be sent over. Called Patient to let her know that once we receive order we will then contact her to schedule her orientation. Patient is ready!

## 2017-08-24 NOTE — Telephone Encounter (Signed)
Received referral. Ready to be processed

## 2017-08-25 NOTE — Telephone Encounter (Signed)
Spoke with patient - scheduled orientation for 09/13/17 @ 9:30am. She would like to attend the 10:30am exercise classes.

## 2017-08-25 NOTE — Telephone Encounter (Signed)
Verified insurance. No co-pay, no deductible, out of pocket amount is $6,700/$74.59 has been met, no co-insurance, and no pre-authorization is required. Reference # [id number]

## 2017-08-27 ENCOUNTER — Ambulatory Visit (HOSPITAL_BASED_OUTPATIENT_CLINIC_OR_DEPARTMENT_OTHER): Payer: Medicare Other | Attending: Student in an Organized Health Care Education/Training Program

## 2017-08-29 DIAGNOSIS — M329 Systemic lupus erythematosus, unspecified: Secondary | ICD-10-CM | POA: Diagnosis not present

## 2017-08-29 DIAGNOSIS — J449 Chronic obstructive pulmonary disease, unspecified: Secondary | ICD-10-CM | POA: Diagnosis not present

## 2017-08-31 ENCOUNTER — Encounter: Payer: Self-pay | Admitting: Internal Medicine

## 2017-08-31 ENCOUNTER — Ambulatory Visit (INDEPENDENT_AMBULATORY_CARE_PROVIDER_SITE_OTHER): Payer: Medicare Other | Admitting: Internal Medicine

## 2017-08-31 VITALS — BP 129/100 | HR 93 | Temp 98.8°F | Ht 62.0 in | Wt 227.1 lb

## 2017-08-31 DIAGNOSIS — R509 Fever, unspecified: Secondary | ICD-10-CM | POA: Diagnosis not present

## 2017-08-31 DIAGNOSIS — F17211 Nicotine dependence, cigarettes, in remission: Secondary | ICD-10-CM | POA: Diagnosis not present

## 2017-08-31 DIAGNOSIS — M79671 Pain in right foot: Secondary | ICD-10-CM | POA: Diagnosis not present

## 2017-08-31 DIAGNOSIS — I509 Heart failure, unspecified: Secondary | ICD-10-CM

## 2017-08-31 DIAGNOSIS — R0601 Orthopnea: Secondary | ICD-10-CM | POA: Diagnosis not present

## 2017-08-31 DIAGNOSIS — I2510 Atherosclerotic heart disease of native coronary artery without angina pectoris: Secondary | ICD-10-CM | POA: Diagnosis not present

## 2017-08-31 DIAGNOSIS — M109 Gout, unspecified: Secondary | ICD-10-CM

## 2017-08-31 DIAGNOSIS — R197 Diarrhea, unspecified: Secondary | ICD-10-CM

## 2017-08-31 DIAGNOSIS — R51 Headache: Secondary | ICD-10-CM

## 2017-08-31 DIAGNOSIS — M79605 Pain in left leg: Principal | ICD-10-CM

## 2017-08-31 DIAGNOSIS — M7989 Other specified soft tissue disorders: Secondary | ICD-10-CM | POA: Diagnosis not present

## 2017-08-31 DIAGNOSIS — J449 Chronic obstructive pulmonary disease, unspecified: Secondary | ICD-10-CM

## 2017-08-31 DIAGNOSIS — Z23 Encounter for immunization: Secondary | ICD-10-CM | POA: Diagnosis not present

## 2017-08-31 DIAGNOSIS — M79604 Pain in right leg: Secondary | ICD-10-CM

## 2017-08-31 DIAGNOSIS — Z9989 Dependence on other enabling machines and devices: Secondary | ICD-10-CM

## 2017-08-31 DIAGNOSIS — R002 Palpitations: Secondary | ICD-10-CM | POA: Diagnosis not present

## 2017-08-31 DIAGNOSIS — Z7901 Long term (current) use of anticoagulants: Secondary | ICD-10-CM

## 2017-08-31 DIAGNOSIS — Z9889 Other specified postprocedural states: Secondary | ICD-10-CM

## 2017-08-31 MED ORDER — TRAMADOL HCL 50 MG PO TABS: 50.0000 mg | ORAL_TABLET | Freq: Three times a day (TID) | ORAL | 0 refills | Status: AC | PRN

## 2017-08-31 MED ORDER — TRAMADOL HCL 50 MG PO TABS: 50.0000 mg | ORAL_TABLET | Freq: Three times a day (TID) | ORAL | 0 refills | Status: DC | PRN

## 2017-08-31 NOTE — Progress Notes (Signed)
Tramadol rx called to [facility].

## 2017-08-31 NOTE — Assessment & Plan Note (Addendum)
The patient states that she has been having pain in her right lower extremity for the past 1-2 months. The pain was initially in both legs, but now is localized to the right foot. The pain is 11/10, constantly present, sharp in nature, nonradiating, worsens with movement. The patient states tylenol does not alleviate the pain. She states that she took 6 tylenols today and is still in pain.   She was seen on 07/02/17 for bilateral lower extremity pain and was prescribed tylenol.  The patient has been managed for gout in the right big toe in August 2018 with Naproxen without any relief. The patient was recently prescribed prednisone on 08/06/17 for copd exacerbation which could potentially benefit her foot pain if due to a gout flare. However, the patient stated that the prednisone did not alleviate her pain.   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   The patient has been compliant with her Eliquis medication and states that she rarely misses any doses.   -X-ray of right foot done to evaluate for possible charcot foot, anatomical deformity, or osteoarthritis -Doppler of right foot to evaluate for possible DVT -Tramadol 29m TID prn prescribed for 7 days  Addendum -The patient does not have any evidence of DVT or superficial thrombosis noted per Doppler on 09/02/2017 in bilateral extremities. -Right foot x-ray showed  1. Great toe interphalangeal erosive arthropathy that is nonspecific. Please ensure no infectious symptoms. 2. First MTP joint osteoarthritis.  No Charcot foot deformity visualized -Low suspicion for gout but started on colchicine 0.6 mg twice daily -Rheumatology referral made -Return to clinic in 2 weeks

## 2017-08-31 NOTE — Assessment & Plan Note (Signed)
The patient states that she had chest pain last night that lasted 1 hr. The chest pain was on the left side of her chest, non-radiating, throbbing in nature, and with accompanied shortness of breath. She did not have accompanied shortness of breath or diaphoresis. The patient is currently on 2L of oxygen at rest and increases it to 3L when moving. The patient states that for the past 2-3 weeks she has had to increase her oxygen at nighttime when trying to sleep to 3-4.  She has not had chest pain since last night.   -Instructed patient to call 911 immediately if she has any chest pain in the future

## 2017-08-31 NOTE — Patient Instructions (Signed)
It was a pleasure to see you today [name]. During this visit we spoke about about your right foot pain  -We have scheduled a right foot x-ray and right foot doppler to evaluate the cause of your pain -Tramadol 94m three time a day as needed for pain relief -Please call 911 immediately if you experience chest pain  Thank you,  VLars Mage MD Internal Medicine PGY1

## 2017-08-31 NOTE — Progress Notes (Signed)
CC: Right leg swelling  HPI:  [name] is a 61 y.o. with pmh of CAD, CHF, COPD, and Gout who presents for acute right leg swelling. Please see assessment and plan for more details.   Past Medical History:  Diagnosis Date  . CAD (coronary artery disease)    NSTEMI 08/2011:  LHC 08/21/11: mLAD 60-70%, pCFX occluded, dRCA chronic occlusion with L-R collats, EF 40-45%, inf AK.  PCI:  BMS to CFX.  Marland Kitchen CHF (congestive heart failure) ([place]) 05/31/2017  . COPD (chronic obstructive pulmonary disease) ([place]) 11/17/2016  . Depression   . Fibroids   . GERD (gastroesophageal reflux disease)   . Gout   . HLD (hyperlipidemia)    Chol = 235, LDL = 156 (08/2010)  . Hypertension   . Lupus 2009   ANA + 10/2008, repeat ANA + (05/2009), on that visit 05/2009 following labs obtained RF <20, CRP <0.4, ANA titers 1:80,   . On home oxygen therapy    "2L; 24/7 (05/31/2017)  . Pulmonary nodules 05/2008   noted on CXR and CT 05/2008, repeat CT 10/2008 - Stable small bilateral pulmonary nodules measuring up to 6 m m  . Stroke [facility]) 04/2014   "mini stroke"; denies residual on 05/31/2017   Review of Systems:   Review of Systems  Constitutional: Positive for fever. Negative for chills.  Eyes: Negative for blurred vision.  Respiratory: Positive for shortness of breath.   Cardiovascular: Positive for chest pain, palpitations, orthopnea and leg swelling.  Gastrointestinal: Positive for diarrhea (2-3 days). Negative for abdominal pain, nausea and vomiting.  Neurological: Positive for headaches.    Physical Exam:  There were no vitals filed for this visit.  Physical Exam  Constitutional: She appears well-developed and well-nourished. No distress.  HENT:  Head: Normocephalic and atraumatic.  Eyes: Conjunctivae are normal.  Cardiovascular: Normal rate, regular rhythm, normal heart sounds and intact distal pulses.   Pulmonary/Chest: Effort normal and breath sounds normal. No respiratory distress.  She has no wheezes.  Abdominal: Soft. Bowel sounds are normal. She exhibits no distension. There is no tenderness.  Musculoskeletal: Normal range of motion. She exhibits tenderness (right first toe and metatarsal area. Darkening noted over right foot).       Right ankle: She exhibits swelling. She exhibits normal range of motion and normal pulse. Tenderness.       Feet:  Psychiatric: Her speech is normal and behavior is normal. Thought content normal. Her mood appears anxious.    Assessment & Plan:   See Encounters Tab for problem based charting.  Patient seen with Dr. Lynnae January

## 2017-09-02 ENCOUNTER — Telehealth: Payer: Self-pay | Admitting: *Deleted

## 2017-09-02 ENCOUNTER — Ambulatory Visit (HOSPITAL_COMMUNITY)
Admission: RE | Admit: 2017-09-02 | Discharge: 2017-09-02 | Disposition: A | Discharge status: Home / Self Care | Payer: Medicare Other | Source: Ambulatory Visit | Attending: Internal Medicine | Admitting: Internal Medicine

## 2017-09-02 ENCOUNTER — Telehealth: Payer: Self-pay | Admitting: Cardiovascular Disease

## 2017-09-02 DIAGNOSIS — M19071 Primary osteoarthritis, right ankle and foot: Secondary | ICD-10-CM | POA: Insufficient documentation

## 2017-09-02 DIAGNOSIS — M79605 Pain in left leg: Secondary | ICD-10-CM | POA: Insufficient documentation

## 2017-09-02 DIAGNOSIS — M79604 Pain in right leg: Secondary | ICD-10-CM

## 2017-09-02 DIAGNOSIS — S93304A Unspecified dislocation of right foot, initial encounter: Secondary | ICD-10-CM | POA: Diagnosis not present

## 2017-09-02 DIAGNOSIS — M7989 Other specified soft tissue disorders: Secondary | ICD-10-CM | POA: Insufficient documentation

## 2017-09-02 NOTE — Telephone Encounter (Signed)
Also received call from [facility] radiology re: right foot x-ray; informed report is in EPIC. I will send message to Dr Maricela Bo.

## 2017-09-02 NOTE — Telephone Encounter (Signed)
Spoke with Elizabeth [name], she is not concerned about the chest pain she had a couple days ago, she is more concerned about the swelling she has in her feet and ankles. She reports it has been there for 1-2 months and is getting worse. She had dopplers and x-rays of her foot and toe that were fine. She reports SOB anytime she takes her oxygen off, no matter how long. She reports orthopnea for 1-2 months as well. She does not weigh daily and reports not urinating a lot from the furosemide. The is currently taking furosemide 40 mg 3 tablets in the morning and 1 tablet in the afternoon. She request to be seen, she reports she is scared. She also feels her lungs are getting worse and is going to call her lung doctor. Explained to patient the ER might be the best action if she feels she needs to be seen right away, the patient declined to go to the ER. Follow up scheduled Tuesday next week with APP in dr croitoru's absence.

## 2017-09-02 NOTE — Telephone Encounter (Signed)
error

## 2017-09-02 NOTE — Telephone Encounter (Signed)
candance from venous calls and states pt is NEGATIVE for venous studies, report is under notes at this time, please refer to notes Sending to dr's chundi, butcher, blum

## 2017-09-02 NOTE — Progress Notes (Addendum)
VASCULAR LAB PRELIMINARY  PRELIMINARY  PRELIMINARY  PRELIMINARY  Right lower extremity venous duplex completed.    Preliminary report:  There is no DVT or SVT noted in the right lower extremity.  Incidentally, the PT, AT, and DP arteries appear to have adequate flow.    Called results to [facility], [facility], RVT 09/02/2017, 1:22 PM

## 2017-09-02 NOTE — Telephone Encounter (Signed)
Pt c/o of Chest Pain: STAT if CP now or developed within 24 hours  1. Are you having CP right now? no  2. Are you experiencing any other symptoms (ex. SOB, nausea, vomiting, sweating)? No, her toe is black and it hurts/ she said that she is having headaches  3. How long have you been experiencing CP? Three or four days  4. Is your CP continuous or coming and going? Coming and going   5. Have you taken Nitroglycerin? no?

## 2017-09-03 ENCOUNTER — Other Ambulatory Visit: Payer: Self-pay | Admitting: Internal Medicine

## 2017-09-03 DIAGNOSIS — G8929 Other chronic pain: Secondary | ICD-10-CM

## 2017-09-03 DIAGNOSIS — M79674 Pain in right toe(s): Principal | ICD-10-CM

## 2017-09-03 MED ORDER — COLCHICINE 0.6 MG PO TABS: 0.6000 mg | ORAL_TABLET | Freq: Two times a day (BID) | ORAL | 0 refills | Status: DC

## 2017-09-03 NOTE — Progress Notes (Signed)
Has F/U Dr Hetty Ely 10/23.  Start colchicine 0.6 BID until F/U Referral to rheum  Would you pls call pt and ask her to take the colchicine BID for toe pain and to see Dr Hetty Ely?  Thanks

## 2017-09-03 NOTE — Progress Notes (Signed)
Internal Medicine Clinic Attending  I saw and evaluated the patient.  I personally confirmed the key portions of the history and exam documented by Dr. Maricela Bo and I reviewed pertinent patient test results.  The assessment, diagnosis, and plan were formulated together and I agree with the documentation in the resident's note. Pt's presentation was very atypical for gout - length of sxs, no response to NSAIDs, Prednisone, prior nl uric acid level when asymptomatic. Today on review of chart, pt has had similar presentation - toe and leg pain - with similar W/U - dopplers. Prior serological W/U inc Neg CRP Sed rate 25 and 29 + ANA up to 1:80 titer, centromere pattern DsDNA neg x2 RA neg Ro and La neg She had appt Rheum [facility] 07/17/15 but it appears she never went. She also has a self reported h/o SLE. I seriously doubt she was bacterial infection of joint due to length of sxs and lack of systemic sxs. 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

## 2017-09-03 NOTE — Progress Notes (Signed)
Internal Medicine Clinic Attending  I saw and evaluated the patient.  I personally confirmed the key portions of the history and exam documented by Dr. Maricela Bo and I reviewed pertinent patient test results.  The assessment, diagnosis, and plan were formulated together and I agree with the documentation in the resident's note. Pt's presentation was very atypical for gout - length of sxs, no response to NSAIDs, Prednisone. Today on review of chart, pt has had similar presentation - toe and leg pain - with similar W/U - dopplers. Prior serological W/U inc Neg CRP Sed rate 25 and 29 + ANA up to 1:80 titer, centromere pattern DsDNA neg x2 RA neg Ro and La neg Uric acid 7.8 She had appt Rheum [facility] 07/17/15 but it appears she never went. She also has a self reported h/o SLE. I seriously doubt she was bacterial infection of joint due to length of sxs and lack of systemic sxs. Atypical gout possible since MTP, CPPP less likely. SLE typically does not cause erosions. Changes not typical for psoriatic arthritis. Regardless, I like she needs a referral to rheum. In meantime, would treat with colchicine 0.6 BID, refer to rheum, RTC in 1-2 weeks.

## 2017-09-04 NOTE — Telephone Encounter (Signed)
Thank you

## 2017-09-07 ENCOUNTER — Ambulatory Visit (INDEPENDENT_AMBULATORY_CARE_PROVIDER_SITE_OTHER): Payer: Medicare Other | Admitting: Physician Assistant

## 2017-09-07 ENCOUNTER — Encounter: Payer: Medicare Other | Admitting: Internal Medicine

## 2017-09-07 VITALS — BP 128/80 | HR 76 | Resp 16 | Ht 62.0 in | Wt 229.4 lb

## 2017-09-07 DIAGNOSIS — Z79899 Other long term (current) drug therapy: Secondary | ICD-10-CM | POA: Diagnosis not present

## 2017-09-07 DIAGNOSIS — I5033 Acute on chronic diastolic (congestive) heart failure: Secondary | ICD-10-CM

## 2017-09-07 DIAGNOSIS — I2721 Secondary pulmonary arterial hypertension: Secondary | ICD-10-CM

## 2017-09-07 DIAGNOSIS — I5032 Chronic diastolic (congestive) heart failure: Secondary | ICD-10-CM | POA: Diagnosis not present

## 2017-09-07 DIAGNOSIS — I480 Paroxysmal atrial fibrillation: Secondary | ICD-10-CM

## 2017-09-07 MED ORDER — TORSEMIDE 20 MG PO TABS: 60.0000 mg | ORAL_TABLET | Freq: Every day | ORAL | 3 refills | Status: DC

## 2017-09-07 NOTE — Progress Notes (Deleted)
Albuterol  Trelegy inhaler   eliquis 5 mg bid   Carvedilol 12.5 mg bid  Torsemide 60 mg qd  Right foot pain  Patient came in last week complaining of right lower extremity pain for the last 1-2 months. X-ray of the foot showed erosions of the great toe interphalangeal joint. She reports a history of gout which has previously been well controlled with NSAIDs. Prior workup is remarkable for elevated sedimentation rate in the past (25 and 29) but negative CRP and a positive ANA 1:80 with negative ds DNA x2. Colchicine 0.6 mg bid and Tramadol 50 mg TID PRN were prescribed and tramadol was refilled earlier today. She was also referred to rheumatology at [facility].   protonix 40 mg qd  Valsartan 320 mg qd   Obstructive sleep apnea  Patient had sleep study which was positive for mild OSA in 2013. ** Reporting symptoms of morning headache, nocturia, and daytime lethargy.  - Order a repeat sleep study  Rosuvastatin 20 mg qd

## 2017-09-07 NOTE — Patient Instructions (Signed)
Medication Instructions:  STOP LASIX  START TORSEMIDE 60MG (3-20MG TAB) 3 TAB DAILY OK TO INCREASE TO 4 DAILY FOR WEIGHT GAIN >3 POUNDS IN ONE DAY -OR- 5 POUNDS IN A WEEK If you need a refill on your cardiac medications before your next appointment, please call your pharmacy.  Labwork: BMET TODAY HERE IN OUR OFFICE AT [facility]  Special Instructions: LOG WEIGHT DAILY  2,000MG LOW SODIUM DIET 500 MG PER MEAL  2 LITRES/QUARTS FLUID RESTRICTION IS ALL LIQUIDS  OK TO DRINK 1 BEER ONLY DAILY  DISCUSS STRESS TESTING AT NEXT VISIT  Follow-Up: Your physician wants you to follow-up in: 3 WEEKS WITH Rhonda OR PA.   Thank you for choosing [facility] at [facility]!!     Low-Sodium Eating Plan Sodium, which is an element that makes up salt, helps you maintain a healthy balance of fluids in your body. Too much sodium can increase your blood pressure and cause fluid and waste to be held in your body. Your health care provider or dietitian may recommend following this plan if you have high blood pressure (hypertension), kidney disease, liver disease, or heart failure. Eating less sodium can help lower your blood pressure, reduce swelling, and protect your heart, liver, and kidneys. What are tips for following this plan? General guidelines  Most people on this plan should limit their sodium intake to 1,500-2,000 mg (milligrams) of sodium each day. Reading food labels  The Nutrition Facts label lists the amount of sodium in one serving of the food. If you eat more than one serving, you must multiply the listed amount of sodium by the number of servings.  Choose foods with less than 140 mg of sodium per serving.  Avoid foods with 300 mg of sodium or more per serving. Shopping  Look for lower-sodium products, often labeled as "low-sodium" or "no salt added."  Always check the sodium content even if foods are labeled as "unsalted" or "no salt added".  Buy fresh foods. ? Avoid canned  foods and premade or frozen meals. ? Avoid canned, cured, or processed meats  Buy breads that have less than 80 mg of sodium per slice. Cooking  Eat more home-cooked food and less restaurant, buffet, and fast food.  Avoid adding salt when cooking. Use salt-free seasonings or herbs instead of table salt or sea salt. Check with your health care provider or pharmacist before using salt substitutes.  Cook with plant-based oils, such as canola, sunflower, or olive oil. Meal planning  When eating at a restaurant, ask that your food be prepared with less salt or no salt, if possible.  Avoid foods that contain MSG (monosodium glutamate). MSG is sometimes added to [place] food, bouillon, and some canned foods. What foods are recommended? The items listed may not be a complete list. Talk with your dietitian about what dietary choices are best for you. Grains Low-sodium cereals, including oats, puffed wheat and rice, and shredded wheat. Low-sodium crackers. Unsalted rice. Unsalted pasta. Low-sodium bread. Whole-grain breads and whole-grain pasta. Vegetables Fresh or frozen vegetables. "No salt added" canned vegetables. "No salt added" tomato sauce and paste. Low-sodium or reduced-sodium tomato and vegetable juice. Fruits Fresh, frozen, or canned fruit. Fruit juice. Meats and other protein foods Fresh or frozen (no salt added) meat, poultry, seafood, and fish. Low-sodium canned tuna and salmon. Unsalted nuts. Dried peas, beans, and lentils without added salt. Unsalted canned beans. Eggs. Unsalted nut butters. Dairy Milk. Soy milk. Cheese that is naturally low in sodium, such as ricotta  cheese, fresh mozzarella, or Swiss cheese Low-sodium or reduced-sodium cheese. Cream cheese. Yogurt. Fats and oils Unsalted butter. Unsalted margarine with no trans fat. Vegetable oils such as canola or olive oils. Seasonings and other foods Fresh and dried herbs and spices. Salt-free seasonings. Low-sodium mustard  and ketchup. Sodium-free salad dressing. Sodium-free light mayonnaise. Fresh or refrigerated horseradish. Lemon juice. Vinegar. Homemade, reduced-sodium, or low-sodium soups. Unsalted popcorn and pretzels. Low-salt or salt-free chips. What foods are not recommended? The items listed may not be a complete list. Talk with your dietitian about what dietary choices are best for you. Grains Instant hot cereals. Bread stuffing, pancake, and biscuit mixes. Croutons. Seasoned rice or pasta mixes. Noodle soup cups. Boxed or frozen macaroni and cheese. Regular salted crackers. Self-rising flour. Vegetables Sauerkraut, pickled vegetables, and relishes. Olives. [place] fries. Onion rings. Regular canned vegetables (not low-sodium or reduced-sodium). Regular canned tomato sauce and paste (not low-sodium or reduced-sodium). Regular tomato and vegetable juice (not low-sodium or reduced-sodium). Frozen vegetables in sauces. Meats and other protein foods Meat or fish that is salted, canned, smoked, spiced, or pickled. Bacon, ham, sausage, hotdogs, corned beef, chipped beef, packaged lunch meats, salt pork, jerky, pickled herring, anchovies, regular canned tuna, sardines, salted nuts. Dairy Processed cheese and cheese spreads. Cheese curds. Blue cheese. Feta cheese. String cheese. Regular cottage cheese. Buttermilk. Canned milk. Fats and oils Salted butter. Regular margarine. Ghee. Bacon fat. Seasonings and other foods Onion salt, garlic salt, seasoned salt, table salt, and sea salt. Canned and packaged gravies. Worcestershire sauce. Tartar sauce. Barbecue sauce. Teriyaki sauce. Soy sauce, including reduced-sodium. Steak sauce. Fish sauce. Oyster sauce. Cocktail sauce. Horseradish that you find on the shelf. Regular ketchup and mustard. Meat flavorings and tenderizers. Bouillon cubes. Hot sauce and Tabasco sauce. Premade or packaged marinades. Premade or packaged taco seasonings. Relishes. Regular salad dressings. Salsa.  Potato and tortilla chips. Corn chips and puffs. Salted popcorn and pretzels. Canned or dried soups. Pizza. Frozen entrees and pot pies. Summary  Eating less sodium can help lower your blood pressure, reduce swelling, and protect your heart, liver, and kidneys.  Most people on this plan should limit their sodium intake to 1,500-2,000 mg (milligrams) of sodium each day.  Canned, boxed, and frozen foods are high in sodium. Restaurant foods, fast foods, and pizza are also very high in sodium. You also get sodium by adding salt to food.  Try to cook at home, eat more fresh fruits and vegetables, and eat less fast food, canned, processed, or prepared foods. This information is not intended to replace advice given to you by your health care provider. Make sure you discuss any questions you have with your health care provider. Document Released: 04/24/2002 Document Revised: 10/26/2016 Document Reviewed: 10/26/2016 Elsevier Interactive Patient Education  2017 [employer].

## 2017-09-07 NOTE — Progress Notes (Signed)
Cardiology Office Note   Date:  09/07/2017   ID:  Joyel, Chenette 1956/10/22, MRN [id number]  PCP:  Ledell Noss, MD  Cardiologist:  Dr. Sallyanne Kuster, 06/14/2017  Rosaria Ferries, PA-C   Chief Complaint  Patient presents with  . Shortness of Breath    while moving  . Chest Pain    History of Present Illness: Elizabeth [name] is a 61 y.o. female with a history of BMS CFX 2012, CABG x 4 2017, COPD on home O2, CHF, GERD, HLD, HTN, TIA, ICM w/ EF 50-55% and grade 2 dd on echo 05/2017, pulm nodules, remote cocaine, PAF formerly on amio, CHADSVasc 6 (TIA 2, gender, hypertension, CAD, CHF) on coumadin  05/2017 office visit, weight 219 pounds, volume status good, follow-up 6 months  Elizabeth [name] presents for cardiology follow up.  She is routinely on O2 at 2 lpm, goes to 3 lpm when she moves around. She does not have any scales. She does not wheeze as much as she just gets short of breath with any exertion. No upper respiratory symptoms, no new cough. She is compliant with her medications including nebulizers and inhalers.  She lives with her sister and 2 nieces. They cook and try to do lower sodium. Not much prepared foods. She does drink a great deal of water. She does drink an occasional beer and some juice.   She wakes with swelling in her R foot, has discoloration in her foot the area of the wonders about gout. She has had some swelling in the L one also. She has noticed more swelling during the day. She wakes up with very little edema if any.  She does not feel the Lasix is doing the job, she is taking 80 mg bid.   She does not sleep well. She wakes frequently, has not had a test for sleep apnea. Dr Vaughan Browner is scheduling this. She describes ?orthopnea and PND vs OSA. She has problems with getting SOB with exertion, even on the O2.    Past Medical History:  Diagnosis Date  . CAD (coronary artery disease)    NSTEMI 08/2011:  LHC 08/21/11: mLAD 60-70%, pCFX  occluded, dRCA chronic occlusion with L-R collats, EF 40-45%, inf AK.  PCI:  BMS to CFX.  Marland Kitchen CHF (congestive heart failure) ([place]) 05/31/2017  . COPD (chronic obstructive pulmonary disease) ([place]) 11/17/2016  . Depression   . Fibroids   . GERD (gastroesophageal reflux disease)   . Gout   . HLD (hyperlipidemia)    Chol = 235, LDL = 156 (08/2010)  . Hypertension   . Lupus 2009   ANA + 10/2008, repeat ANA + (05/2009), on that visit 05/2009 following labs obtained RF <20, CRP <0.4, ANA titers 1:80,   . On home oxygen therapy    "2L; 24/7 (05/31/2017)  . Pulmonary nodules 05/2008   noted on CXR and CT 05/2008, repeat CT 10/2008 - Stable small bilateral pulmonary nodules measuring up to 6 m m  . Stroke [facility]) 04/2014   "mini stroke"; denies residual on 05/31/2017    Past Surgical History:  Procedure Laterality Date  . CARDIAC CATHETERIZATION N/A 06/02/2016   Procedure: Left Heart Cath and Coronary Angiography;  Surgeon: Belva Crome, MD;  Location: [place] CV LAB;  Service: Cardiovascular;  Laterality: N/A;  . COLONOSCOPY N/A 07/14/2013   Procedure: COLONOSCOPY;  Surgeon: Beryle Beams, MD;  Location: [facility] ENDOS[facility];  Service: Endoscopy;  Laterality: N/A;  . CORONARY ANGIOPLASTY WITH STENT  PLACEMENT    . CORONARY ARTERY BYPASS GRAFT N/A 06/08/2016   Procedure: CORONARY ARTERY BYPASS GRAFTING (CABG) X 4  With Right Greater Saphenous vein endoscopic harvesting;  Surgeon: [name], MD;  Location: [place];  Service: Open Heart Surgery;  Laterality: N/A;  . FRACTURE SURGERY    . ORIF TIBIA PLATEAU Left 08/22/2013   Procedure: OPEN REDUCTION INTERNAL FIXATION (ORIF) TIBIAL PLATEAU;  Surgeon: Renette Butters, MD;  Location: [place];  Service: Orthopedics;  Laterality: Left;  . TEE WITHOUT CARDIOVERSION N/A 06/08/2016   Procedure: TRANSESOPHAGEAL ECHOCARDIOGRAM (TEE);  Surgeon: [name], MD;  Location: [place];  Service: Open Heart Surgery;  Laterality: N/A;  . TUBAL LIGATION    . VAGINAL  HYSTERECTOMY  05/2003    Current Outpatient Prescriptions  Medication Sig Dispense Refill  . albuterol (PROVENTIL HFA;VENTOLIN HFA) 108 (90 Base) MCG/ACT inhaler Inhale 2 puffs into the lungs every 6 (six) hours as needed for wheezing or shortness of breath. 1 Inhaler 2  . albuterol (PROVENTIL) (2.5 MG/3ML) 0.083% nebulizer solution Take 3 mLs (2.5 mg total) by nebulization every 6 (six) hours as needed for wheezing or shortness of breath. 120 vial 5  . apixaban (ELIQUIS) 5 MG TABS tablet Take 1 tablet (5 mg total) by mouth 2 (two) times daily. 60 tablet 4  . carvedilol (COREG) 12.5 MG tablet TAKE 1 TABLET(12.5 MG) BY MOUTH TWICE DAILY 180 tablet 3  . colchicine 0.6 MG tablet Take 1 tablet (0.6 mg total) by mouth 2 (two) times daily. 30 tablet 0  . Fluticasone-Umeclidin-Vilant (TRELEGY ELLIPTA) 100-62.5-25 MCG/INH AEPB Inhale 1 puff into the lungs daily. 1 each 5  . furosemide (LASIX) 40 MG tablet Take 2 tablets (80 mg total) by mouth every morning and take 1 tablet (40 mg total) by mouth every evening. (Patient taking differently: Take 3 tablets (120 mg total) by mouth every morning and take 1 tablet (40 mg total) by mouth every evening.) 270 tablet 3  . pantoprazole (PROTONIX) 40 MG tablet TAKE 1 TABLET(40 MG) BY MOUTH DAILY 90 tablet 3  . potassium chloride SA (K-DUR,KLOR-CON) 20 MEQ tablet Take 40 mEq by mouth daily.    . rosuvastatin (CRESTOR) 20 MG tablet Take 1 tablet (20 mg total) by mouth at bedtime. 90 tablet 3  . traMADol (ULTRAM) 50 MG tablet Take 1 tablet (50 mg total) by mouth 3 (three) times daily as needed. 20 tablet 0  . valsartan (DIOVAN) 320 MG tablet Take 320 mg by mouth daily.  11   No current facility-administered medications for this visit.     Allergies:   Patient has no known allergies.    Social History:  The patient  reports that she quit smoking about 9 months ago. Her smoking use included Cigarettes. She has a 5.40 pack-year smoking history. She has never used  smokeless tobacco. She reports that she drinks about 8.4 oz of alcohol per week . She reports that she does not use drugs.   Family History:  The patient's family history includes Cervical cancer in her sister; Heart disease in her mother; Prostate cancer in her brother.    ROS:  Please see the history of present illness. All other systems are reviewed and negative.    PHYSICAL EXAM: VS:  BP 128/80   Pulse 76   Resp 16   Ht _0  (1.575 m)   Wt 229 lb 6.4 oz (104.1 kg)   LMP 12/30/1994   SpO2 (!) 86%  BMI 41.96 kg/m  , BMI Body mass index is 41.96 kg/m. GEN: Well nourished, well developed, female in no acute distress  HEENT: normal for age  Neck: JVD 8-9 cm, no carotid bruit, no masses Cardiac: RRR; soft murmur, no rubs, or gallops Respiratory: decreased BS bases bilaterally, normal work of breathing, few rales, no wheeze GI: soft, nontender, nondistended, + BS MS: no deformity or atrophy; trace edema; distal pulses are 2+ in all 4 extremities   Skin: warm and dry, no rash Neuro:  Strength and sensation are intact Psych: euthymic mood, full affect   EKG:  EKG is ordered today. The ekg ordered today demonstrates sinus rhythm, left atrial enlargement, anterolateral T-wave changes are similar to ECG 05/31/2017  ECHO: 05/31/2017 - Left ventricle: The cavity size was normal. There was moderate   focal basal hypertrophy of the septum. Systolic function was   normal. The estimated ejection fraction was in the range of 50%   to 55%. There is akinesis of the basalinferior myocardium.   Features are consistent with a pseudonormal left ventricular   filling pattern, with concomitant abnormal relaxation and   increased filling pressure (grade 2 diastolic dysfunction). - Ventricular septum: The contour showed diastolic flattening. - Aortic valve: Trileaflet; mildly thickened, mildly calcified   leaflets. - Right ventricle: The cavity size was mildly dilated. Wall   thickness was  normal. - Right atrium: The atrium was moderately dilated. - Tricuspid valve: There was moderate regurgitation. - Pulmonary arteries: Systolic pressure was severely increased. PA   peak pressure: 69 mm Hg (S). Impressions: - Compared to the prior study, there has been no significant   interval change.    Recent Labs: 11/26/2016: Magnesium 1.9 05/31/2017: B Natriuretic Peptide 602.4 06/01/2017: ALT 21 08/06/2017: BUN 16; Creatinine, Ser 0.85; Hemoglobin 11.3; Platelets 218.0; Potassium 3.5; Pro B Natriuretic peptide (BNP) 2,160.0; Sodium 139    Lipid Panel    Component Value Date/Time   CHOL 145 03/09/2017 1148   CHOL 236 (H) 08/19/2015 0941   TRIG 104 03/09/2017 1148   HDL 52 03/09/2017 1148   HDL 49 08/19/2015 0941   CHOLHDL 2.8 03/09/2017 1148   VLDL 21 03/09/2017 1148   LDLCALC 72 03/09/2017 1148   LDLCALC 159 (H) 08/19/2015 0941   LDLDIRECT 145.2 10/29/2011 1618     Wt Readings from Last 3 Encounters:  09/07/17 229 lb 6.4 oz (104.1 kg)  08/31/17 227 lb 1.6 oz (103 kg)  08/06/17 225 lb 12.8 oz (102.4 kg)     Other studies Reviewed: Additional studies/ records that were reviewed today include: Office notes, hospital records and testing.  ASSESSMENT AND PLAN:  1.  Acute on chronic diastolic CHF: She only has mild volume overload by exam. However, because of her lung disease, she tolerates volume overload very poorly. She feels the Lasix is not managing her edema.  We will change her to Demadex 60 mg daily and she is to follow her weight. I advised her only felt like she had 5 pounds of fluid she needed to lose at this time. She is encouraged to be diet compliant and fluid intake compliant. No check a BMET today and see how she is doing in about 3 or 4 weeks. If her volume status is improved and she continues to have significant shortness of breath, we may need to consider stress testing. She was given scales today so she can follow for weight.  2. Hypertension: Her  blood pressure is well controlled on current medications  3. Possible sleep apnea: She does not sleep well. There may be some orthopnea and PND contributing, but she is at high risk to have sleep apnea. She is encouraged to follow-up with Dr. Vaughan Browner and schedule the sleep study.  4. PAF: She is in sinus rhythm today. She is compliant with Eliquis. Continue carvedilol with no dose change.   Current medicines are reviewed at length with the patient today.  The patient does not have concerns regarding medicines.  The following changes have been made:  DC Lasix, his heart torsemide  Labs/ tests ordered today include:   Orders Placed This Encounter  Procedures  . Basic metabolic panel  . EKG 12-Lead     Disposition:   FU with Dr. Sallyanne Kuster  Signed, Rosaria Ferries, PA-C  09/07/2017 2:46 PM    [place] Phone: [phone]; Fax: [phone]  This note was written with the assistance of speech recognition software. Please excuse any transcriptional errors.

## 2017-09-08 LAB — BASIC METABOLIC PANEL
BUN / CREAT RATIO: 13 (ref 9–23)
BUN: 14 mg/dL (ref 6–24)
CHLORIDE: 98 mmol/L (ref 96–106)
CO2: 26 mmol/L (ref 20–29)
Calcium: 9.5 mg/dL (ref 8.7–10.2)
Creatinine, Ser: 1.05 mg/dL — ABNORMAL HIGH (ref 0.57–1.00)
GFR calc non Af Amer: 58 mL/min/{1.73_m2} — ABNORMAL LOW (ref >59)
GFR, EST AFRICAN AMERICAN: 67 mL/min/{1.73_m2} (ref >59)
Glucose: 93 mg/dL (ref 65–99)
POTASSIUM: 3.5 mmol/L (ref 3.5–5.2)
Sodium: 143 mmol/L (ref 134–144)

## 2017-09-08 NOTE — Progress Notes (Signed)
No answer when called

## 2017-09-08 NOTE — Progress Notes (Signed)
Thanks [employer]

## 2017-09-09 ENCOUNTER — Other Ambulatory Visit: Payer: Self-pay | Admitting: Internal Medicine

## 2017-09-09 NOTE — Telephone Encounter (Signed)
Ok thank you 

## 2017-09-10 ENCOUNTER — Telehealth: Payer: Self-pay

## 2017-09-10 NOTE — Addendum Note (Signed)
Addended by: Hulan Fray on: 09/10/2017 02:33 PM   Modules accepted: Orders

## 2017-09-10 NOTE — Addendum Note (Signed)
Addended by: Lars Mage on: 09/10/2017 01:38 PM   Modules accepted: Orders

## 2017-09-10 NOTE — Telephone Encounter (Signed)
Please refill or refuse. Thanks

## 2017-09-10 NOTE — Telephone Encounter (Signed)
Requesting refill on Tramadol. Please call pt back.

## 2017-09-10 NOTE — Telephone Encounter (Signed)
Scheduled f/u appt in [facility] for mon 10/29, wants tramadol refilled

## 2017-09-12 ENCOUNTER — Encounter (HOSPITAL_BASED_OUTPATIENT_CLINIC_OR_DEPARTMENT_OTHER): Payer: Medicare Other

## 2017-09-13 ENCOUNTER — Ambulatory Visit (HOSPITAL_COMMUNITY): Payer: Medicare Other

## 2017-09-13 ENCOUNTER — Encounter: Payer: Self-pay | Admitting: Internal Medicine

## 2017-09-13 ENCOUNTER — Ambulatory Visit: Payer: Medicare Other

## 2017-09-13 NOTE — Telephone Encounter (Signed)
Patient needed to reschedule orientation. New date of orientation is 09/16/17 at 8:00am.

## 2017-09-14 DIAGNOSIS — H43812 Vitreous degeneration, left eye: Secondary | ICD-10-CM | POA: Diagnosis not present

## 2017-09-15 NOTE — Progress Notes (Signed)
Pt answered phone today, she has been taking colchicine but she states her foot is worse and her toe is black, scheduled in [facility] 11/1 at 1415

## 2017-09-16 ENCOUNTER — Inpatient Hospital Stay (HOSPITAL_COMMUNITY): Admission: RE | Admit: 2017-09-16 | Payer: Medicare Other | Source: Ambulatory Visit

## 2017-09-16 ENCOUNTER — Encounter (HOSPITAL_COMMUNITY): Payer: Self-pay | Admitting: Emergency Medicine

## 2017-09-16 ENCOUNTER — Telehealth (HOSPITAL_COMMUNITY): Payer: Self-pay

## 2017-09-16 ENCOUNTER — Emergency Department (HOSPITAL_COMMUNITY): Payer: Medicare Other

## 2017-09-16 ENCOUNTER — Ambulatory Visit: Payer: Medicare Other

## 2017-09-16 ENCOUNTER — Inpatient Hospital Stay (HOSPITAL_COMMUNITY)
Admission: EM | Admit: 2017-09-16 | Discharge: 2017-09-23 | DRG: 247 | Disposition: A | Discharge status: Home / Self Care | Payer: Medicare Other | Attending: Cardiovascular Disease | Admitting: Cardiovascular Disease

## 2017-09-16 DIAGNOSIS — R0600 Dyspnea, unspecified: Secondary | ICD-10-CM | POA: Diagnosis not present

## 2017-09-16 DIAGNOSIS — G4733 Obstructive sleep apnea (adult) (pediatric): Secondary | ICD-10-CM | POA: Diagnosis not present

## 2017-09-16 DIAGNOSIS — I252 Old myocardial infarction: Secondary | ICD-10-CM

## 2017-09-16 DIAGNOSIS — I2582 Chronic total occlusion of coronary artery: Secondary | ICD-10-CM | POA: Diagnosis not present

## 2017-09-16 DIAGNOSIS — I25119 Atherosclerotic heart disease of native coronary artery with unspecified angina pectoris: Secondary | ICD-10-CM | POA: Diagnosis not present

## 2017-09-16 DIAGNOSIS — R079 Chest pain, unspecified: Secondary | ICD-10-CM | POA: Diagnosis not present

## 2017-09-16 DIAGNOSIS — I5032 Chronic diastolic (congestive) heart failure: Secondary | ICD-10-CM | POA: Diagnosis not present

## 2017-09-16 DIAGNOSIS — I25720 Atherosclerosis of autologous artery coronary artery bypass graft(s) with unstable angina pectoris: Secondary | ICD-10-CM | POA: Diagnosis not present

## 2017-09-16 DIAGNOSIS — M329 Systemic lupus erythematosus, unspecified: Secondary | ICD-10-CM | POA: Diagnosis present

## 2017-09-16 DIAGNOSIS — I2721 Secondary pulmonary arterial hypertension: Secondary | ICD-10-CM | POA: Diagnosis not present

## 2017-09-16 DIAGNOSIS — F329 Major depressive disorder, single episode, unspecified: Secondary | ICD-10-CM | POA: Diagnosis present

## 2017-09-16 DIAGNOSIS — E785 Hyperlipidemia, unspecified: Secondary | ICD-10-CM | POA: Diagnosis present

## 2017-09-16 DIAGNOSIS — I272 Pulmonary hypertension, unspecified: Secondary | ICD-10-CM

## 2017-09-16 DIAGNOSIS — R911 Solitary pulmonary nodule: Secondary | ICD-10-CM | POA: Diagnosis not present

## 2017-09-16 DIAGNOSIS — K219 Gastro-esophageal reflux disease without esophagitis: Secondary | ICD-10-CM | POA: Diagnosis present

## 2017-09-16 DIAGNOSIS — Z6841 Body Mass Index (BMI) 40.0 and over, adult: Secondary | ICD-10-CM

## 2017-09-16 DIAGNOSIS — I480 Paroxysmal atrial fibrillation: Secondary | ICD-10-CM | POA: Diagnosis present

## 2017-09-16 DIAGNOSIS — Z87891 Personal history of nicotine dependence: Secondary | ICD-10-CM

## 2017-09-16 DIAGNOSIS — I25118 Atherosclerotic heart disease of native coronary artery with other forms of angina pectoris: Secondary | ICD-10-CM | POA: Diagnosis not present

## 2017-09-16 DIAGNOSIS — I25719 Atherosclerosis of autologous vein coronary artery bypass graft(s) with unspecified angina pectoris: Secondary | ICD-10-CM | POA: Diagnosis not present

## 2017-09-16 DIAGNOSIS — E876 Hypokalemia: Secondary | ICD-10-CM | POA: Diagnosis not present

## 2017-09-16 DIAGNOSIS — M797 Fibromyalgia: Secondary | ICD-10-CM | POA: Diagnosis present

## 2017-09-16 DIAGNOSIS — M109 Gout, unspecified: Secondary | ICD-10-CM | POA: Diagnosis not present

## 2017-09-16 DIAGNOSIS — R9431 Abnormal electrocardiogram [ECG] [EKG]: Secondary | ICD-10-CM | POA: Diagnosis not present

## 2017-09-16 DIAGNOSIS — J431 Panlobular emphysema: Secondary | ICD-10-CM | POA: Diagnosis not present

## 2017-09-16 DIAGNOSIS — Z955 Presence of coronary angioplasty implant and graft: Secondary | ICD-10-CM

## 2017-09-16 DIAGNOSIS — R0602 Shortness of breath: Secondary | ICD-10-CM

## 2017-09-16 DIAGNOSIS — I774 Celiac artery compression syndrome: Secondary | ICD-10-CM | POA: Diagnosis not present

## 2017-09-16 DIAGNOSIS — J42 Unspecified chronic bronchitis: Secondary | ICD-10-CM | POA: Diagnosis not present

## 2017-09-16 DIAGNOSIS — Z8673 Personal history of transient ischemic attack (TIA), and cerebral infarction without residual deficits: Secondary | ICD-10-CM | POA: Diagnosis not present

## 2017-09-16 DIAGNOSIS — Z9981 Dependence on supplemental oxygen: Secondary | ICD-10-CM

## 2017-09-16 DIAGNOSIS — I2723 Pulmonary hypertension due to lung diseases and hypoxia: Secondary | ICD-10-CM | POA: Diagnosis present

## 2017-09-16 DIAGNOSIS — I2510 Atherosclerotic heart disease of native coronary artery without angina pectoris: Secondary | ICD-10-CM | POA: Diagnosis not present

## 2017-09-16 DIAGNOSIS — Z951 Presence of aortocoronary bypass graft: Secondary | ICD-10-CM | POA: Diagnosis not present

## 2017-09-16 DIAGNOSIS — Z79899 Other long term (current) drug therapy: Secondary | ICD-10-CM | POA: Diagnosis not present

## 2017-09-16 DIAGNOSIS — E559 Vitamin D deficiency, unspecified: Secondary | ICD-10-CM

## 2017-09-16 DIAGNOSIS — I5033 Acute on chronic diastolic (congestive) heart failure: Secondary | ICD-10-CM | POA: Diagnosis present

## 2017-09-16 DIAGNOSIS — I25110 Atherosclerotic heart disease of native coronary artery with unstable angina pectoris: Secondary | ICD-10-CM | POA: Diagnosis not present

## 2017-09-16 DIAGNOSIS — I361 Nonrheumatic tricuspid (valve) insufficiency: Secondary | ICD-10-CM | POA: Diagnosis not present

## 2017-09-16 DIAGNOSIS — I110 Hypertensive heart disease with heart failure: Secondary | ICD-10-CM | POA: Diagnosis not present

## 2017-09-16 DIAGNOSIS — E669 Obesity, unspecified: Secondary | ICD-10-CM | POA: Diagnosis present

## 2017-09-16 DIAGNOSIS — J810 Acute pulmonary edema: Secondary | ICD-10-CM | POA: Diagnosis not present

## 2017-09-16 DIAGNOSIS — J449 Chronic obstructive pulmonary disease, unspecified: Secondary | ICD-10-CM | POA: Diagnosis not present

## 2017-09-16 DIAGNOSIS — Z7901 Long term (current) use of anticoagulants: Secondary | ICD-10-CM

## 2017-09-16 DIAGNOSIS — I5043 Acute on chronic combined systolic (congestive) and diastolic (congestive) heart failure: Secondary | ICD-10-CM | POA: Diagnosis present

## 2017-09-16 DIAGNOSIS — R0609 Other forms of dyspnea: Secondary | ICD-10-CM | POA: Diagnosis not present

## 2017-09-16 DIAGNOSIS — I2729 Other secondary pulmonary hypertension: Secondary | ICD-10-CM | POA: Diagnosis not present

## 2017-09-16 LAB — CBC
HEMATOCRIT: 44.4 % (ref 36.0–46.0)
HEMOGLOBIN: 13.6 g/dL (ref 12.0–15.0)
MCH: 25.4 pg — AB (ref 26.0–34.0)
MCHC: 30.6 g/dL (ref 30.0–36.0)
MCV: 82.8 fL (ref 78.0–100.0)
Platelets: 208 10*3/uL (ref 150–400)
RBC: 5.36 MIL/uL — AB (ref 3.87–5.11)
RDW: 17.3 % — ABNORMAL HIGH (ref 11.5–15.5)
WBC: 4.9 10*3/uL (ref 4.0–10.5)

## 2017-09-16 LAB — HEPATIC FUNCTION PANEL
ALBUMIN: 3.9 g/dL (ref 3.5–5.0)
ALK PHOS: 148 U/L — AB (ref 38–126)
ALT: 21 U/L (ref 14–54)
AST: 43 U/L — ABNORMAL HIGH (ref 15–41)
Bilirubin, Direct: 0.2 mg/dL (ref 0.1–0.5)
Indirect Bilirubin: 0.8 mg/dL (ref 0.3–0.9)
TOTAL PROTEIN: 8.2 g/dL — AB (ref 6.5–8.1)
Total Bilirubin: 1 mg/dL (ref 0.3–1.2)

## 2017-09-16 LAB — BASIC METABOLIC PANEL
ANION GAP: 15 (ref 5–15)
BUN: 22 mg/dL — ABNORMAL HIGH (ref 6–20)
CHLORIDE: 101 mmol/L (ref 101–111)
CO2: 21 mmol/L — AB (ref 22–32)
Calcium: 9.1 mg/dL (ref 8.9–10.3)
Creatinine, Ser: 1.08 mg/dL — ABNORMAL HIGH (ref 0.44–1.00)
GFR calc Af Amer: >60 mL/min (ref >60)
GFR calc non Af Amer: 55 mL/min — ABNORMAL LOW (ref >60)
GLUCOSE: 108 mg/dL — AB (ref 65–99)
POTASSIUM: 3.2 mmol/L — AB (ref 3.5–5.1)
Sodium: 137 mmol/L (ref 135–145)

## 2017-09-16 LAB — SEDIMENTATION RATE: SED RATE: 20 mm/h (ref 0–22)

## 2017-09-16 LAB — I-STAT TROPONIN, ED: Troponin i, poc: 0 ng/mL (ref 0.00–0.08)

## 2017-09-16 LAB — BRAIN NATRIURETIC PEPTIDE: B Natriuretic Peptide: 668.9 pg/mL — ABNORMAL HIGH (ref 0.0–100.0)

## 2017-09-16 MED ORDER — PANTOPRAZOLE SODIUM 40 MG PO TBEC
40.0000 mg | DELAYED_RELEASE_TABLET | Freq: Every day | ORAL | Status: DC
  Administered 2017-09-17 – 2017-09-23 (×7): 40 mg via ORAL
  Filled 2017-09-16 (×7): qty 1

## 2017-09-16 MED ORDER — FUROSEMIDE 10 MG/ML IJ SOLN
120.0000 mg | Freq: Once | INTRAMUSCULAR | Status: AC
Start: 2017-09-16 — End: 2017-09-16
  Administered 2017-09-16: 120 mg via INTRAVENOUS
  Filled 2017-09-16: qty 12

## 2017-09-16 MED ORDER — FUROSEMIDE 10 MG/ML IJ SOLN
80.0000 mg | Freq: Once | INTRAMUSCULAR | Status: AC
  Administered 2017-09-16: 80 mg via INTRAVENOUS
  Filled 2017-09-16: qty 8

## 2017-09-16 MED ORDER — APIXABAN 5 MG PO TABS
5.0000 mg | ORAL_TABLET | Freq: Two times a day (BID) | ORAL | Status: DC
  Administered 2017-09-16 – 2017-09-19 (×7): 5 mg via ORAL
  Filled 2017-09-16 (×8): qty 1

## 2017-09-16 MED ORDER — SODIUM CHLORIDE 0.9 % IV SOLN: 250.0000 mL | INTRAVENOUS | Status: DC | PRN

## 2017-09-16 MED ORDER — FLUTICASONE-UMECLIDIN-VILANT 100-62.5-25 MCG/INH IN AEPB: 1.0000 | INHALATION_SPRAY | Freq: Every day | RESPIRATORY_TRACT | Status: DC

## 2017-09-16 MED ORDER — NITROGLYCERIN 0.4 MG SL SUBL
0.4000 mg | SUBLINGUAL_TABLET | SUBLINGUAL | Status: DC | PRN
Start: 2017-09-16 — End: 2017-09-23

## 2017-09-16 MED ORDER — ACETAMINOPHEN 325 MG PO TABS
650.0000 mg | ORAL_TABLET | ORAL | Status: DC | PRN
  Administered 2017-09-17 – 2017-09-19 (×2): 650 mg via ORAL
  Filled 2017-09-16 (×2): qty 2

## 2017-09-16 MED ORDER — SODIUM CHLORIDE 0.9% FLUSH
3.0000 mL | Freq: Two times a day (BID) | INTRAVENOUS | Status: DC
  Administered 2017-09-17 – 2017-09-19 (×7): 3 mL via INTRAVENOUS

## 2017-09-16 MED ORDER — IRBESARTAN 75 MG PO TABS
150.0000 mg | ORAL_TABLET | Freq: Every day | ORAL | Status: DC
  Administered 2017-09-17 – 2017-09-23 (×5): 150 mg via ORAL
  Filled 2017-09-16 (×4): qty 1
  Filled 2017-09-16: qty 2
  Filled 2017-09-16 (×3): qty 1

## 2017-09-16 MED ORDER — FUROSEMIDE 10 MG/ML IJ SOLN: 40.0000 mg | Freq: Once | INTRAMUSCULAR | Status: DC

## 2017-09-16 MED ORDER — ONDANSETRON HCL 4 MG/2ML IJ SOLN: 4.0000 mg | Freq: Four times a day (QID) | INTRAMUSCULAR | Status: DC | PRN

## 2017-09-16 MED ORDER — IOPAMIDOL (ISOVUE-370) INJECTION 76%
INTRAVENOUS | Status: AC
  Administered 2017-09-16: 100 mL
  Filled 2017-09-16: qty 100

## 2017-09-16 MED ORDER — ALBUTEROL SULFATE HFA 108 (90 BASE) MCG/ACT IN AERS: 2.0000 | INHALATION_SPRAY | Freq: Four times a day (QID) | RESPIRATORY_TRACT | Status: DC | PRN

## 2017-09-16 MED ORDER — FLUTICASONE FUROATE-VILANTEROL 200-25 MCG/INH IN AEPB
1.0000 | INHALATION_SPRAY | Freq: Every day | RESPIRATORY_TRACT | Status: DC
  Administered 2017-09-17 – 2017-09-23 (×7): 1 via RESPIRATORY_TRACT
  Filled 2017-09-16 (×2): qty 28

## 2017-09-16 MED ORDER — ALBUTEROL SULFATE (2.5 MG/3ML) 0.083% IN NEBU
2.5000 mg | INHALATION_SOLUTION | Freq: Four times a day (QID) | RESPIRATORY_TRACT | Status: DC | PRN
  Administered 2017-09-17 – 2017-09-19 (×2): 2.5 mg via RESPIRATORY_TRACT
  Filled 2017-09-16 (×2): qty 3

## 2017-09-16 MED ORDER — SODIUM CHLORIDE 0.9% FLUSH: 3.0000 mL | INTRAVENOUS | Status: DC | PRN

## 2017-09-16 MED ORDER — ALPRAZOLAM 0.25 MG PO TABS
0.2500 mg | ORAL_TABLET | Freq: Two times a day (BID) | ORAL | Status: DC | PRN
  Administered 2017-09-16 – 2017-09-19 (×4): 0.25 mg via ORAL
  Filled 2017-09-16 (×4): qty 1

## 2017-09-16 MED ORDER — UMECLIDINIUM BROMIDE 62.5 MCG/INH IN AEPB
1.0000 | INHALATION_SPRAY | Freq: Every day | RESPIRATORY_TRACT | Status: DC
  Administered 2017-09-17 – 2017-09-23 (×7): 1 via RESPIRATORY_TRACT
  Filled 2017-09-16 (×2): qty 7

## 2017-09-16 MED ORDER — COLCHICINE 0.6 MG PO TABS
0.6000 mg | ORAL_TABLET | Freq: Two times a day (BID) | ORAL | Status: DC
  Administered 2017-09-16 – 2017-09-23 (×14): 0.6 mg via ORAL
  Filled 2017-09-16 (×14): qty 1

## 2017-09-16 MED ORDER — ZOLPIDEM TARTRATE 5 MG PO TABS
5.0000 mg | ORAL_TABLET | Freq: Every evening | ORAL | Status: DC | PRN
  Administered 2017-09-19 – 2017-09-23 (×5): 5 mg via ORAL
  Filled 2017-09-16 (×6): qty 1

## 2017-09-16 MED ORDER — CARVEDILOL 12.5 MG PO TABS
12.5000 mg | ORAL_TABLET | Freq: Two times a day (BID) | ORAL | Status: DC
  Administered 2017-09-17 – 2017-09-23 (×12): 12.5 mg via ORAL
  Filled 2017-09-16 (×13): qty 1

## 2017-09-16 NOTE — ED Notes (Signed)
ED Provider at bedside. 

## 2017-09-16 NOTE — ED Notes (Signed)
Pt offered purewick female catheter since she is going to get lasix, pt refused and stated she had used one before and did not like it.

## 2017-09-16 NOTE — Progress Notes (Signed)
Patient transferred to [place] form ED. Patient alert and oriented. Admission assessment completed. CCMD notified. Family updated. Patient in bed resting. Will continue to monitor.

## 2017-09-16 NOTE — H&P (Signed)
Cardiology Admission History and Physical:   Patient ID: Elizabeth [name]; MRN: [id number]; DOB: 11-18-1955   Admission date: 09/16/2017  Primary Care Provider: Ledell Noss, MD Primary Cardiologist: Dr. Debby Bud 06/14/2017  Chief Complaint:  Chest pain, shortness of breath and edema  Patient Profile:   Elizabeth [name] is a 61 y.o. female with a history of CAD s/p CABG 05/2016 w/ LIMA-LAD, SVG-OM-CFX, SVG-RCA, CHF, COPD on home oxygen, hyperlipidemia, hypertension, fibromyalgia, GERD, PAF formerly on amiodarone now on Eliquis, remote cocaine use and lupus who presented with chest pain since yesterday and also recent weight gain and bilateral lower extremity swelling  History of Present Illness:   Ms. Pennel developed chest pain starting yesterday morning that seemed to get better but then this morning it came back and was worse.  Initially she described it as as feeling like her previous heart attack with pressure type pain but progressed to more of a stabbing pain that radiated to her back.  This is not like pain she has had before.  She also had a HA. She took Tylenol with improvement in her symptoms.   She states that she had difficulty breathing over the last month and has had to increase her home oxygen.  She has a productive cough but no fever.  She has had recent weight gain and bilateral lower extremity edema.   The patient was last seen in our office on 09/07/17 by Rosaria Ferries, PA at which time the patient was mildly volume overloaded by exam and the patient is known to not tolerate volume overload very well.  The patient was having some orthopnea and PND and was suspected to have sleep apnea.  Her Lasix was changed to Demadex 60 mg daily and the patient was provided with a scale to follow daily weights (but forgot it).  She was advised on a 2 g sodium diet and 2 L fluid restriction.  Today, she woke again with chest pain and SOB. The pain was going through her chest. No  change with deep inspiration. She ended up calling EMS, ASA and SL NTG did not change the pain.   In the ER, she has gotten Lasix 80 mg IV x 1, has urinated once in 3 hours.  The patient had a cardiac catheterization in 2017 after a high risk nuclear scan and was found to have severe three-vessel CAD and was taken for coronary artery bypass grafting on 06/08/2016.  EF was down to 40-45% at the time of her surgery.  Echocardiogram in 05/2017 showed LVEF 50-55%, akinesis of the basal inferior myocardium, grade 2 diastolic dysfunction, moderate TR, PA peak pressure 69 mmHg.  Lower extremity Dopplers were negative for DVT on 09/02/17.  Significant findings: Troponin  0.00 BNP 668.9 Potassium 3.2, creatinine 1.08     Chest x-ray: Prior CABG with diffuse mild bilateral interstitial prominence suggesting mild CHF: Mild pneumonitis cannot be excluded  CTA of the chest: No evidence of aneurysmal dilatation or dissection or PE.  Scattered benign looking pulmonary nodules.  Diffuse emphysematous changes no focal infiltrate or sizable effusion.   Past Medical History:  Diagnosis Date  . CAD (coronary artery disease)    NSTEMI 08/2011:  LHC 08/21/11: mLAD 60-70%, pCFX occluded, dRCA chronic occlusion with L-R collats, EF 40-45%, inf AK.  PCI:  BMS to CFX.  Marland Kitchen CHF (congestive heart failure) ([place]) 05/31/2017  . COPD (chronic obstructive pulmonary disease) ([place]) 11/17/2016  . Depression   . Fibroids   . GERD (gastroesophageal  reflux disease)   . Gout   . HLD (hyperlipidemia)    Chol = 235, LDL = 156 (08/2010)  . Hypertension   . Lupus 2009   ANA + 10/2008, repeat ANA + (05/2009), on that visit 05/2009 following labs obtained RF <20, CRP <0.4, ANA titers 1:80,   . On home oxygen therapy    "2L; 24/7 (05/31/2017)  . Pulmonary nodules 05/2008   noted on CXR and CT 05/2008, repeat CT 10/2008 - Stable small bilateral pulmonary nodules measuring up to 6 m m  . Stroke [facility]) 04/2014   "mini stroke"; denies  residual on 05/31/2017    Past Surgical History:  Procedure Laterality Date  . CARDIAC CATHETERIZATION N/A 06/02/2016   Procedure: Left Heart Cath and Coronary Angiography;  Surgeon: Belva Crome, MD;  Location: [facility];  Service: Cardiovascular;  Laterality: N/A;  . COLONOSCOPY N/A 07/14/2013   Procedure: COLONOSCOPY;  Surgeon: Beryle Beams, MD;  Location: [facility];  Service: Endoscopy;  Laterality: N/A;  . CORONARY ANGIOPLASTY WITH STENT PLACEMENT    . CORONARY ARTERY BYPASS GRAFT N/A 06/08/2016   Procedure: CORONARY ARTERY BYPASS GRAFTING (CABG) X 4 Diagnostic Diagram (LIMA-LAD, SVG-OM-CFX, SVG-RCA) With Right Greater Saphenous vein endoscopic harvesting;  Surgeon: [name], MD;  Location: [facility];  Service: Open Heart Surgery;  Laterality: N/A;  . FRACTURE SURGERY    . ORIF TIBIA PLATEAU Left 08/22/2013   Procedure: OPEN REDUCTION INTERNAL FIXATION (ORIF) TIBIAL PLATEAU;  Surgeon: Renette Butters, MD;  Location: [facility];  Service: Orthopedics;  Laterality: Left;  . TEE WITHOUT CARDIOVERSION N/A 06/08/2016   Procedure: TRANSESOPHAGEAL ECHOCARDIOGRAM (TEE);  Surgeon: [name], MD;  Location: [facility];  Service: Open Heart Surgery;  Laterality: N/A;  . TUBAL LIGATION    . VAGINAL HYSTERECTOMY  05/2003     Medications Prior to Admission: Medication Sig  acetaminophen (TYLENOL) 500 MG tablet Take 500 mg by mouth every 6 (six) hours as needed for mild pain.  albuterol (PROVENTIL HFA;VENTOLIN HFA) 108 (90 Base) MCG/ACT inhaler Inhale 2 puffs into the lungs every 6 (six) hours as needed for wheezing or shortness of breath.  albuterol (PROVENTIL) (2.5 MG/3ML) 0.083% nebulizer solution Take 3 mLs (2.5 mg total) by nebulization every 6 (six) hours as needed for wheezing or shortness of breath.  apixaban (ELIQUIS) 5 MG TABS tablet Take 1 tablet (5 mg total) by mouth 2 (two) times daily.  carvedilol (COREG) 12.5 MG tablet TAKE 1 TABLET(12.5 MG) BY MOUTH TWICE DAILY    colchicine 0.6 MG tablet Take 1 tablet (0.6 mg total) by mouth 2 (two) times daily.  Fluticasone-Umeclidin-Vilant (TRELEGY ELLIPTA) 100-62.5-25 MCG/INH AEPB Inhale 1 puff into the lungs daily.  furosemide (LASIX) 40 MG tablet Take 40-80 mg by mouth 2 (two) times daily. 2 tabs every morning -1 tablet in the afternoon  pantoprazole (PROTONIX) 40 MG tablet TAKE 1 TABLET(40 MG) BY MOUTH DAILY  torsemide (DEMADEX) 20 MG tablet Take 3 tablets (60 mg total) by mouth daily. Ok to increase to 4 tab prn swelling/wt gain >3#/QD -or- >5#/WEEK  valsartan (DIOVAN) 320 MG tablet Take 320 mg by mouth daily.  rosuvastatin (CRESTOR) 20 MG tablet Take 1 tablet (20 mg total) by mouth at bedtime. Patient not taking: Reported on 09/16/2017     Allergies:   No Known Allergies  Social History:   Social History   Social History  . Marital status: Divorced    Spouse name: N/A  . Number of  children: N/A  . Years of education: N/A   Occupational History  . Retired    Social History Main Topics  . Smoking status: Former Smoker    Packs/day: 0.12    Years: 45.00    Types: Cigarettes    Quit date: 11/16/2016  . Smokeless tobacco: Never Used  . Alcohol use 8.4 oz/week    7 Cans of beer, 7 Standard drinks or equivalent per week     Comment: 1 beer/day  . Drug use: No     Comment: 05/31/2017 "nothing since ~ 2016"  . Sexual activity: No   Other Topics Concern  . Not on file   Social History Narrative   Pt lives with sister and 2 nieces.    Family History:   The patient's family history includes Cervical cancer in her sister; Heart disease in her mother; Prostate cancer in her brother. There is no history of Colon cancer.    ROS:  Please see the history of present illness.  All other ROS reviewed and negative.     Physical Exam/Data:   Vitals:   09/16/17 1115 09/16/17 1145 09/16/17 1200 09/16/17 1245  BP: 108/76 104/75 126/86   Pulse: 84 84 90 83  Resp: (!) _0 Temp:      TempSrc:       SpO2:  97%  94%   No intake or output data in the 24 hours ending 09/16/17 1406 There were no vitals filed for this visit. There is no height or weight on file to calculate BMI.  General:  Well nourished, well developed, in moderate respiratory distress HEENT: normal Lymph: no adenopathy Neck: minimal JVD Endocrine:  No thryomegaly Vascular: No carotid bruits; 4/4 extremity pulses 2+    Cardiac:  normal S1, S2; RRR; soft murmur  Lungs:  Decreased BS bases bilaterally, no wheezing, rhonchi, few rales  Abd: soft, nontender, no hepatomegaly  Ext: no edema Musculoskeletal:  No deformities, BUE and BLE strength normal and equal Skin: warm and dry  Neuro:  CNs 2-12 intact, no focal abnormalities noted Psych:  Normal affect    EKG:  The ECG that was done 11/01 was personally reviewed and demonstrates SR, HR 92, diffuse T wave changes, similar to previous ECGs   Relevant CV Studies:  Echocardiogram 05/31/2017 Study Conclusions - Left ventricle: The cavity size was normal. There was moderate   focal basal hypertrophy of the septum. Systolic function was   normal. The estimated ejection fraction was in the range of 50%   to 55%. There is akinesis of the basalinferior myocardium.   Features are consistent with a pseudonormal left ventricular   filling pattern, with concomitant abnormal relaxation and   increased filling pressure (grade 2 diastolic dysfunction). - Ventricular septum: The contour showed diastolic flattening. - Aortic valve: Trileaflet; mildly thickened, mildly calcified   leaflets. - Right ventricle: The cavity size was mildly dilated. Wall   thickness was normal. - Right atrium: The atrium was moderately dilated. - Tricuspid valve: There was moderate regurgitation. - Pulmonary arteries: Systolic pressure was severely increased. PA   peak pressure: 69 mm Hg (S). Impressions: - Compared to the prior study, there has been no significant   interval change.  CARDIAC  CATH: 06/02/2016 Diagnostic Diagram, pre-CABG      Laboratory Data:  Chemistry  Recent Labs Lab 09/16/17 0800  NA 137  K 3.2*  CL 101  CO2 21*  GLUCOSE 108*  BUN 22*  CREATININE 1.08*  CALCIUM 9.1  GFRNONAA 55*  GFRAA >60  ANIONGAP 15    Lab Results  Component Value Date   ALT 21 06/01/2017   AST 22 06/01/2017   ALKPHOS 190 (H) 06/01/2017   BILITOT 1.1 06/01/2017   Hematology  Recent Labs Lab 09/16/17 0800  WBC 4.9  RBC 5.36*  HGB 13.6  HCT 44.4  MCV 82.8  MCH 25.4*  MCHC 30.6  RDW 17.3*  PLT 208   Cardiac EnzymesNo results for input(s): TROPONINI in the last 168 hours.   Recent Labs Lab 09/16/17 0819  TROPIPOC 0.00    BNP  Recent Labs Lab 09/16/17 0800  BNP 668.9*     Radiology/Studies:  Dg Chest 2 View  Result Date: 09/16/2017 CLINICAL DATA:  Left-sided chest pain.  Shortness of breath. EXAM: CHEST  2 VIEW COMPARISON:  08/06/2017 . FINDINGS: Prior CABG. Cardiomegaly with mild bilateral interstitial prominence. Mild CHF cannot be excluded. Mild pneumonitis cannot be excluded. Prominent pleural effusion. No pneumothorax. IMPRESSION: Prior CABG with diffuse mild bilateral interstitial prominence suggesting mild CHF. Mild pneumonitis cannot be excluded . Electronically Signed   By: Marcello Moores  Register   On: 09/16/2017 09:03   Ct Angio Chest/abd/pel For Dissection W And/or Wo Contrast  Result Date: 09/16/2017 CLINICAL DATA:  Chest pain radiating into the left shoulder for several hours, initial encounter EXAM: CT ANGIOGRAPHY CHEST, ABDOMEN AND PELVIS TECHNIQUE: Multidetector CT imaging through the chest, abdomen and pelvis was performed using the standard protocol during bolus administration of intravenous contrast. Multiplanar reconstructed images and MIPs were obtained and reviewed to evaluate the vascular anatomy. CONTRAST:  100 mL Isovue 370. COMPARISON:  05/31/2017, 03/11/2017 FINDINGS: CTA CHEST FINDINGS Cardiovascular: Thoracic aorta and its  branches demonstrate mild atherosclerotic calcifications. Changes of prior coronary bypass grafting are noted. Coronary calcifications are seen. No aneurysmal dilatation of the aorta is seen. No dissection is noted. The pulmonary artery is well visualize with a normal branching pattern. No findings to suggest pulmonary embolism are identified. Mild but stable cardiac enlargement is noted. Mediastinum/Nodes: Thoracic inlet demonstrates a hypodense nodule in the left lobe of the thyroid measuring approximately 14 mm. It is somewhat better visualized on the current study. Scattered small hilar and mediastinal lymph nodes are noted but improved when compared with the prior study. The esophagus is within normal limits. Lungs/Pleura: Lungs are well aerated bilaterally. Diffuse emphysematous changes are seen. Scattered pulmonary nodules are identified. The most prominent of these lies in the right middle lobe. These are stable from prior exam. No focal infiltrate or sizable effusion is noted. Musculoskeletal: Mild degenerative changes of the thoracic spine are noted. No acute bony abnormality is seen. Review of the MIP images confirms the above findings. CTA ABDOMEN AND PELVIS FINDINGS VASCULAR Aorta: Atherosclerotic calcifications of the aorta are noted without aneurysmal dilatation. Normal aortic bifurcation is seen. Celiac: Mild stenosis is noted at the origin of the celiac axis. SMA: Patent without evidence of aneurysm, dissection, vasculitis or significant stenosis. Renals: Single renal arteries are identified bilaterally. No focal stenosis is noted. IMA: Within normal limits. Iliacs : Mild atherosclerotic disease is noted without aneurysmal dilatation or focal stenosis. Veins: No definitive venous abnormality is noted although opacification is limited. Review of the MIP images confirms the above findings. NON-VASCULAR Hepatobiliary: Mild fatty infiltration of the liver is noted. The gallbladder is within normal  limits. Pancreas: Unremarkable. No pancreatic ductal dilatation or surrounding inflammatory changes. Spleen: Normal in size without focal abnormality. Adrenals/Urinary Tract: Adrenal glands are unremarkable. Kidneys  are normal, without renal calculi, focal lesion, or hydronephrosis. Bladder is unremarkable. Stomach/Bowel: Mild diverticular change of the colon is noted without evidence of diverticulitis. No obstructive or inflammatory changes are seen. The appendix is within normal limits and air filled. Lymphatic: No specific lymphadenopathy is noted. Reproductive: Status post hysterectomy. No adnexal masses. Other: No abdominal wall hernia or abnormality. No abdominopelvic ascites. Musculoskeletal: Degenerative changes of lumbar spine are noted. No acute bony abnormality is seen. Review of the MIP images confirms the above findings. IMPRESSION: Atherosclerotic changes without evidence of aneurysmal dilatation or dissection. Mild celiac axis stenosis. Left thyroid nodule stable from previous exams. The need for nonemergent workup can be determined on a clinical basis. Scattered benign-appearing pulmonary nodule stable from multiple previous exams. Scattered chronic changes within the abdomen. No acute abnormality is noted. Electronically Signed   By: Inez Catalina M.D.   On: 09/16/2017 10:17    Assessment and Plan:   1. Acute on chronic diastolic heart failure:  EF 50-55% with grade 2 diastolic dysfunction by echo 05/2017.   - Patient presented with complaints of chest pain, dyspnea on exertion, increased edema. Sx started about 36 hr ago - pt has not been weighing herself, denies sodium or fluid indiscretion - She has elevated BNP and negative troponin.   - mild CHF on chest x-ray, see chest CT report - Office weights have been 225 (07/2017) and recently 229 (09/07/17) at which time the patient was felt to be fluid overloaded.  She needs a current weight for today.  - Has been given lasix 80 mg IV with  only one trip to [place].  - discuss diuresis with IV MD. Strict I&I and daily wts. Low sodium diet, 2 gm.   2. CAD s/p CAGB X 4 in 05/2016: On BB, ARB and statin. Not aspirin due to need for anticoagulation.  - cycle ez, currently negative and ECG not acute - may need R/L cath to fully figure this out  2. PAF formerly on amio, CHADSVasc 6 (TIA 2, gender, hypertension, CAD, CHF) on Eiliquis. Maintaining sinus rhythm on carvedilol.   3. Hypertension: Home meds include carvedilol 12.5 mg, torsemide 20 mg and valsartan 320 mg. Blood pressure is well controlled and stable.   4. Potential sleep apnea: Pt has been instructed to follow up with Dr. Vaughan Browner to schedule sleep study.   5. Hyperlipidemia: On Crestor 20 mg    Severity of Illness: The appropriate patient status for this patient is INPATIENT. Inpatient status is judged to be reasonable and necessary in order to provide the required intensity of service to ensure the patient's safety. The patient's presenting symptoms, physical exam findings, and initial radiographic and laboratory data in the context of their chronic comorbidities is felt to place them at high risk for further clinical deterioration. Furthermore, it is not anticipated that the patient will be medically stable for discharge from the hospital within 2 midnights of admission. The following factors support the patient status of inpatient.   " The patient's presenting symptoms include SOB, Chest pain. " The worrisome physical exam findings include abnl CXR, elevated BNP. " The initial radiographic and laboratory data are worrisome because of abnl CXR " The chronic co-morbidities include COPD.   * I certify that at the point of admission it is my clinical judgment that the patient will require inpatient hospital care spanning beyond 2 midnights from the point of admission due to high intensity of service, high risk for further deterioration and high frequency  of surveillance  required.*    For questions or updates, please contact [facility] Please consult [email or web address] for contact info under Cardiology/STEMI.    Augusto Garbe  09/16/2017 2:06 PM   Attending Note:   The patient was seen and examined.  Agree with assessment and plan as noted above.  Changes made to the above note as needed.  Patient seen and independently examined with Rosaria Ferries, PA .   We discussed all aspects of the encounter. I agree with the assessment and plan as stated above.  1.  Shortness of breath: The patient woke up with severe shortness of breath.  These symptoms are unusual for her.  This is not what her presenting symptoms were like when she presented with her heart attack.  She does have a cough but her sputum is clear.  She has not had any fevers.  She denies any pleuritic chest pain but does have some chest discomfort.  Initial troponin levels are normal.  She has a rather thick neck so assessing her neck veins is somewhat difficult.  She might have a small amount of CHF on her CT of the chest.  We will try giving her an extra dose of Lasix. She has normal left ventricular systolic function by echo 1 month ago. If she does not improve with the above therapy, we should probably consider a right and left heart catheterization.  We will consider getting a repeat set of pulmonary function test.  2.  Coronary artery disease: She has a history of coronary artery bypass grafting.  Initial troponin is negative.  3.  Chronic diastolic congestive heart failure: The patient had an echocardiogram in July, 2018.  She has well-preserved left ventricular systolic function with akinesis of the inferior basilar myocardium.  She has grade 2 diastolic dysfunction.  4.  Severe pulmonary hypertension: The patient has known history of COPD.  Pulmonary function test from last year showed severe diffusion capacity.  She may benefit from vasodilator  therapy.   I have spent  a total of 40 minutes with patient reviewing hospital  notes , telemetry, EKGs, labs and examining patient as well as establishing an assessment and plan that was discussed with the patient. > 50% of time was spent in direct patient care.    Thayer Headings, Brooke Bonito., MD, [facility] 09/16/2017, 3:01 PM [address],  [place] Pager [phone]

## 2017-09-16 NOTE — ED Notes (Signed)
Patient transported to CT

## 2017-09-16 NOTE — Progress Notes (Signed)
I agree she needs to be evaluated in [facility] ASAP and have follow up of chronic conditions rescheduled with me when she is available to do so, thank you.

## 2017-09-16 NOTE — Telephone Encounter (Signed)
Patient is in ED - Will watch to see if patient in eligible to reschedule orientation and participate in Pulmonary Rehab.

## 2017-09-16 NOTE — ED Triage Notes (Signed)
Pt arrives from home with c/o left sided cp and sob that began yesterday, pain subsided on its own yesterday but returned today with no relief after asa and 1 sl nitro. Pt wears 2L O2 at baseline, on 4L O2 currently with sat of 92%. Pt a/ox4, resp labored. Pt also c/o dry cough.

## 2017-09-16 NOTE — ED Notes (Signed)
Report attempted

## 2017-09-16 NOTE — ED Provider Notes (Signed)
Emergency Department Provider Note   I have reviewed the triage vital signs and the nursing notes.   HISTORY  Chief Complaint Chest Pain   HPI Elizabeth [name] is a 61 y.o. female with a history of coronary artery disease, CHF, COPD, fibromyalgia and lupus who presents to the emergency department today with chest pain.  Patient states it started yesterday but seemed to get better but then this morning it came back and was worse.  Initially it felt like previous heart attack with his change from that pressure type pain to a stabbing pain that radiates to her back.  She has not had any night this before.  She states that she has had increased difficulty breathing over the last month and has had increase her home oxygen.  She does have a productive cough but no fevers.  She has a recent weight gain and bilateral lower extremity swelling.  She states that all these things are new for her and they just increased her Lasix but does not seem to have helped.  No previous travels or surgeries.  Past Medical History:  Diagnosis Date  . CAD (coronary artery disease)    NSTEMI 08/2011:  LHC 08/21/11: mLAD 60-70%, pCFX occluded, dRCA chronic occlusion with L-R collats, EF 40-45%, inf AK.  PCI:  BMS to CFX.  Marland Kitchen CHF (congestive heart failure) ([place]) 05/31/2017  . COPD (chronic obstructive pulmonary disease) ([place]) 11/17/2016  . Depression   . Fibroids   . GERD (gastroesophageal reflux disease)   . Gout   . HLD (hyperlipidemia)    Chol = 235, LDL = 156 (08/2010)  . Hypertension   . Lupus 2009   ANA + 10/2008, repeat ANA + (05/2009), on that visit 05/2009 following labs obtained RF <20, CRP <0.4, ANA titers 1:80,   . On home oxygen therapy    "2L; 24/7 (05/31/2017)  . Pulmonary nodules 05/2008   noted on CXR and CT 05/2008, repeat CT 10/2008 - Stable small bilateral pulmonary nodules measuring up to 6 m m  . Stroke [facility]) 04/2014   "mini stroke"; denies residual on 05/31/2017    Patient Active  Problem List   Diagnosis Date Noted  . Acute on chronic diastolic CHF (congestive heart failure) ([place]) 09/16/2017  . Chronic respiratory failure with hypoxia ([place]) 06/16/2017  . PAH (pulmonary artery hypertension) ([place]) 06/16/2017  . Obstructive sleep apnea 06/09/2017  . Severe obesity (BMI >= 40) ([place]) 06/09/2017  . COPD (chronic obstructive pulmonary disease) ([place]) 11/17/2016  . Elevated serum alkaline phosphatase level 09/24/2016  . Long term current use of anticoagulant 09/22/2016  . Esophageal reflux   . Paroxysmal atrial fibrillation ([place]) 04/20/2016  . Chronic diastolic CHF (congestive heart failure) ([place]) 04/20/2016  . Vitamin D deficiency 03/06/2016  . Healthcare maintenance 08/19/2015  . Atherosclerosis of aorta ([place]) 06/14/2015  . Bilateral lower extremity pain 04/09/2015  . Prediabetes 12/25/2013  . Ischemic cardiomyopathy 10/29/2011  . PULMONARY NODULE 06/14/2008  . Anxiety and depression 12/27/2006  . Dyslipidemia 12/24/2006  . Essential hypertension 12/24/2006  . Coronary atherosclerosis 12/24/2006  . LUPUS 12/24/2006    Past Surgical History:  Procedure Laterality Date  . CARDIAC CATHETERIZATION N/A 06/02/2016   Procedure: Left Heart Cath and Coronary Angiography;  Surgeon: Belva Crome, MD;  Location: [place] [facility];  Service: Cardiovascular;  Laterality: N/A;  . COLONOSCOPY N/A 07/14/2013   Procedure: COLONOSCOPY;  Surgeon: Beryle Beams, MD;  Location: [facility];  Service: Endoscopy;  Laterality: N/A;  .  CORONARY ANGIOPLASTY WITH STENT PLACEMENT    . CORONARY ARTERY BYPASS GRAFT N/A 06/08/2016   Procedure: CORONARY ARTERY BYPASS GRAFTING (CABG) X 4 Diagnostic Diagram (LIMA-LAD, SVG-OM-CFX, SVG-RCA) With Right Greater Saphenous vein endoscopic harvesting;  Surgeon: [name], MD;  Location: [place];  Service: Open Heart Surgery;  Laterality: N/A;  . FRACTURE SURGERY    . ORIF TIBIA PLATEAU Left 08/22/2013   Procedure: OPEN REDUCTION INTERNAL FIXATION  (ORIF) TIBIAL PLATEAU;  Surgeon: Renette Butters, MD;  Location: [place];  Service: Orthopedics;  Laterality: Left;  . TEE WITHOUT CARDIOVERSION N/A 06/08/2016   Procedure: TRANSESOPHAGEAL ECHOCARDIOGRAM (TEE);  Surgeon: [name], MD;  Location: [place];  Service: Open Heart Surgery;  Laterality: N/A;  . TUBAL LIGATION    . VAGINAL HYSTERECTOMY  05/2003    Current Outpatient Rx  . Order #: [id number] Class: Historical Med  . Order #: [id number] Class: Normal  . Order #: [id number] Class: Normal  . Order #: [id number] Class: Normal  . Order #: [id number] Class: Normal  . Order #: [id number] Class: Normal  . Order #: [id number] Class: Normal  . Order #: [id number] Class: Historical Med  . Order #: [id number] Class: Normal  . Order #: [id number] Class: Normal  . Order #: [id number] Class: Historical Med  . Order #: [id number] Class: Normal    Allergies Patient has no known allergies.  Family History  Problem Relation Age of Onset  . Heart disease Mother   . Cervical cancer Sister   . Prostate cancer Brother   . Colon cancer Neg Hx     Social History Social History  Substance Use Topics  . Smoking status: Former Smoker    Packs/day: 0.12    Years: 45.00    Types: Cigarettes    Quit date: 11/16/2016  . Smokeless tobacco: Never Used  . Alcohol use 8.4 oz/week    7 Cans of beer, 7 Standard drinks or equivalent per week     Comment: 1 beer/day    Review of Systems  All other systems negative except as documented in the HPI. All pertinent positives and negatives as reviewed in the HPI. ____________________________________________   PHYSICAL EXAM:  VITAL SIGNS: ED Triage Vitals  Enc Vitals Group     BP 09/16/17 0752 90/66     Pulse Rate 09/16/17 0752 90     Resp 09/16/17 0752 (!) 22     Temp 09/16/17 0752 98.3 F (36.8 C)     Temp Source 09/16/17 0752 Oral     SpO2 09/16/17 0752 92 %     Weight --      Height --      Head Circumference --      Peak Flow --      Pain Score  09/16/17 0749 9     Pain Loc --      Pain Edu? --      Excl. in [place]? --     Constitutional: Alert and oriented. Well appearing and in no acute distress. Eyes: Conjunctivae are normal. PERRL. EOMI. Head: Atraumatic. Nose: No congestion/rhinnorhea. Mouth/Throat: Mucous membranes are moist.  Oropharynx non-erythematous. Neck: No stridor.  No meningeal signs.   Cardiovascular: Normal rate, regular rhythm. Good peripheral circulation. Grossly normal heart sounds.   Respiratory: tachypneic respiratory effort.  No retractions. Lungs CTAB. Gastrointestinal: Soft and nontender. No distention.  Musculoskeletal: No lower extremity tenderness nor edema. No gross deformities of extremities. Neurologic:  Normal speech and language. No gross focal neurologic deficits are appreciated.  Skin:  Skin is warm, dry and  intact. No rash noted.  ____________________________________________   LABS (all labs ordered are listed, but only abnormal results are displayed)  Labs Reviewed  BASIC METABOLIC PANEL - Abnormal; Notable for the following:       Result Value   Potassium 3.2 (*)    CO2 21 (*)    Glucose, Bld 108 (*)    BUN 22 (*)    Creatinine, Ser 1.08 (*)    GFR calc non Af Amer 55 (*)    All other components within normal limits  CBC - Abnormal; Notable for the following:    RBC 5.36 (*)    MCH 25.4 (*)    RDW 17.3 (*)    All other components within normal limits  BRAIN NATRIURETIC PEPTIDE - Abnormal; Notable for the following:    B Natriuretic Peptide 668.9 (*)    All other components within normal limits  SEDIMENTATION RATE  I-STAT TROPONIN, ED   ____________________________________________  EKG   EKG Interpretation  Date/Time:  Thursday September 16 2017 07:48:03 EDT Ventricular Rate:  92 PR Interval:    QRS Duration: 83 QT Interval:  358 QTC Calculation: 443 R Axis:   111 Text Interpretation:  Sinus rhythm S1,S2,S3 pattern Borderline repolarization abnormality Baseline wander  in lead(s) V6 previous ecg with poor baeline making comparison difficult however does seem to have new ST depressions since January Confirmed by Merrily Pew [phone]) on 09/16/2017 8:34:30 AM       ____________________________________________  RADIOLOGY  Dg Chest 2 View  Result Date: 09/16/2017 CLINICAL DATA:  Left-sided chest pain.  Shortness of breath. EXAM: CHEST  2 VIEW COMPARISON:  08/06/2017 . FINDINGS: Prior CABG. Cardiomegaly with mild bilateral interstitial prominence. Mild CHF cannot be excluded. Mild pneumonitis cannot be excluded. Prominent pleural effusion. No pneumothorax. IMPRESSION: Prior CABG with diffuse mild bilateral interstitial prominence suggesting mild CHF. Mild pneumonitis cannot be excluded . Electronically Signed   By: Marcello Moores  Register   On: 09/16/2017 09:03   Ct Angio Chest/abd/pel For Dissection W And/or Wo Contrast  Result Date: 09/16/2017 CLINICAL DATA:  Chest pain radiating into the left shoulder for several hours, initial encounter EXAM: CT ANGIOGRAPHY CHEST, ABDOMEN AND PELVIS TECHNIQUE: Multidetector CT imaging through the chest, abdomen and pelvis was performed using the standard protocol during bolus administration of intravenous contrast. Multiplanar reconstructed images and MIPs were obtained and reviewed to evaluate the vascular anatomy. CONTRAST:  100 mL Isovue 370. COMPARISON:  05/31/2017, 03/11/2017 FINDINGS: CTA CHEST FINDINGS Cardiovascular: Thoracic aorta and its branches demonstrate mild atherosclerotic calcifications. Changes of prior coronary bypass grafting are noted. Coronary calcifications are seen. No aneurysmal dilatation of the aorta is seen. No dissection is noted. The pulmonary artery is well visualize with a normal branching pattern. No findings to suggest pulmonary embolism are identified. Mild but stable cardiac enlargement is noted. Mediastinum/Nodes: Thoracic inlet demonstrates a hypodense nodule in the left lobe of the thyroid measuring  approximately 14 mm. It is somewhat better visualized on the current study. Scattered small hilar and mediastinal lymph nodes are noted but improved when compared with the prior study. The esophagus is within normal limits. Lungs/Pleura: Lungs are well aerated bilaterally. Diffuse emphysematous changes are seen. Scattered pulmonary nodules are identified. The most prominent of these lies in the right middle lobe. These are stable from prior exam. No focal infiltrate or sizable effusion is noted. Musculoskeletal: Mild degenerative changes of the thoracic spine are noted. No acute bony abnormality is seen. Review of the MIP images confirms the above  findings. CTA ABDOMEN AND PELVIS FINDINGS VASCULAR Aorta: Atherosclerotic calcifications of the aorta are noted without aneurysmal dilatation. Normal aortic bifurcation is seen. Celiac: Mild stenosis is noted at the origin of the celiac axis. SMA: Patent without evidence of aneurysm, dissection, vasculitis or significant stenosis. Renals: Single renal arteries are identified bilaterally. No focal stenosis is noted. IMA: Within normal limits. Iliacs : Mild atherosclerotic disease is noted without aneurysmal dilatation or focal stenosis. Veins: No definitive venous abnormality is noted although opacification is limited. Review of the MIP images confirms the above findings. NON-VASCULAR Hepatobiliary: Mild fatty infiltration of the liver is noted. The gallbladder is within normal limits. Pancreas: Unremarkable. No pancreatic ductal dilatation or surrounding inflammatory changes. Spleen: Normal in size without focal abnormality. Adrenals/Urinary Tract: Adrenal glands are unremarkable. Kidneys are normal, without renal calculi, focal lesion, or hydronephrosis. Bladder is unremarkable. Stomach/Bowel: Mild diverticular change of the colon is noted without evidence of diverticulitis. No obstructive or inflammatory changes are seen. The appendix is within normal limits and air  filled. Lymphatic: No specific lymphadenopathy is noted. Reproductive: Status post hysterectomy. No adnexal masses. Other: No abdominal wall hernia or abnormality. No abdominopelvic ascites. Musculoskeletal: Degenerative changes of lumbar spine are noted. No acute bony abnormality is seen. Review of the MIP images confirms the above findings. IMPRESSION: Atherosclerotic changes without evidence of aneurysmal dilatation or dissection. Mild celiac axis stenosis. Left thyroid nodule stable from previous exams. The need for nonemergent workup can be determined on a clinical basis. Scattered benign-appearing pulmonary nodule stable from multiple previous exams. Scattered chronic changes within the abdomen. No acute abnormality is noted. Electronically Signed   By: Inez Catalina M.D.   On: 09/16/2017 10:17    ____________________________________________   PROCEDURES  Procedure(s) performed:   Procedures   ____________________________________________   INITIAL IMPRESSION / ASSESSMENT AND PLAN / ED COURSE  Pertinent labs & imaging results that were available during my care of the patient were reviewed by me and considered in my medical decision making (see chart for details).  Patient at high risk for dissection/PE/CHF exacerbation/ACS so will eval appropriately.   Dissection/PE study negative. Symptoms likely related to fluid overload, but 2/2 impressive cardiac history, needs ACS rule out, will d/w cardiologist regarding same.  ____________________________________________  FINAL CLINICAL IMPRESSION(S) / ED DIAGNOSES  Final diagnoses:  Acute pulmonary edema ([place])     MEDICATIONS GIVEN DURING THIS VISIT:  Medications  furosemide (LASIX) injection 120 mg (not administered)  albuterol (PROVENTIL HFA;VENTOLIN HFA) 108 (90 Base) MCG/ACT inhaler 2 puff (not administered)  albuterol (PROVENTIL) (2.5 MG/3ML) 0.083% nebulizer solution 2.5 mg (not administered)  apixaban (ELIQUIS) tablet 5 mg  (not administered)  carvedilol (COREG) tablet 12.5 mg (not administered)  colchicine tablet 0.6 mg (not administered)  Fluticasone-Umeclidin-Vilant 100-62.5-25 MCG/INH AEPB 1 puff (not administered)  pantoprazole (PROTONIX) EC tablet 40 mg (not administered)  irbesartan (AVAPRO) tablet 150 mg (not administered)  iopamidol (ISOVUE-370) 76 % injection (100 mLs  Contrast Given 09/16/17 0945)  furosemide (LASIX) injection 80 mg (80 mg Intravenous Given 09/16/17 1046)     NEW OUTPATIENT MEDICATIONS STARTED DURING THIS VISIT:  New Prescriptions   No medications on file    Note:  This document was prepared using Dragon voice recognition software and may include unintentional dictation errors.   Merrily Pew, MD 09/16/17 1537

## 2017-09-17 ENCOUNTER — Ambulatory Visit: Payer: Medicare Other | Admitting: Pulmonary Disease

## 2017-09-17 DIAGNOSIS — R0609 Other forms of dyspnea: Secondary | ICD-10-CM

## 2017-09-17 DIAGNOSIS — I2510 Atherosclerotic heart disease of native coronary artery without angina pectoris: Secondary | ICD-10-CM

## 2017-09-17 LAB — BASIC METABOLIC PANEL
ANION GAP: 15 (ref 5–15)
BUN: 24 mg/dL — AB (ref 6–20)
CHLORIDE: 99 mmol/L — AB (ref 101–111)
CO2: 22 mmol/L (ref 22–32)
Calcium: 9.2 mg/dL (ref 8.9–10.3)
Creatinine, Ser: 0.96 mg/dL (ref 0.44–1.00)
GFR calc Af Amer: >60 mL/min (ref >60)
GFR calc non Af Amer: >60 mL/min (ref >60)
GLUCOSE: 96 mg/dL (ref 65–99)
POTASSIUM: 3.1 mmol/L — AB (ref 3.5–5.1)
SODIUM: 136 mmol/L (ref 135–145)

## 2017-09-17 LAB — TROPONIN I

## 2017-09-17 MED ORDER — FUROSEMIDE 10 MG/ML IJ SOLN
80.0000 mg | Freq: Once | INTRAMUSCULAR | Status: AC
  Administered 2017-09-17: 80 mg via INTRAVENOUS
  Filled 2017-09-17: qty 8

## 2017-09-17 MED ORDER — POTASSIUM CHLORIDE CRYS ER 20 MEQ PO TBCR
40.0000 meq | EXTENDED_RELEASE_TABLET | Freq: Two times a day (BID) | ORAL | Status: DC
  Administered 2017-09-17 – 2017-09-23 (×13): 40 meq via ORAL
  Filled 2017-09-17 (×14): qty 2

## 2017-09-17 NOTE — Progress Notes (Signed)
Progress Note  Patient Name: Elizabeth [name] Date of Encounter: 09/17/2017  Primary Cardiologist: Dr. Sallyanne Kuster  Subjective   Patient is still short of breath with exertion.She was quite short of breath when up to wash up at the sink. She has orthopnea, which she says is her norm. No chest pain.   Inpatient Medications    Scheduled Meds: . apixaban  5 mg Oral BID  . carvedilol  12.5 mg Oral BID WC  . colchicine  0.6 mg Oral BID  . fluticasone furoate-vilanterol  1 puff Inhalation Daily   And  . umeclidinium bromide  1 puff Inhalation Daily  . irbesartan  150 mg Oral Daily  . pantoprazole  40 mg Oral Daily  . sodium chloride flush  3 mL Intravenous Q12H   Continuous Infusions: . sodium chloride     PRN Meds: sodium chloride, acetaminophen, albuterol, ALPRAZolam, nitroGLYCERIN, ondansetron (ZOFRAN) IV, sodium chloride flush, zolpidem   Vital Signs    Vitals:   09/16/17 2135 09/17/17 0016 09/17/17 0206 09/17/17 0534  BP: (!) 130/91 (!) 102/59  116/76  Pulse: 94 93  87  Resp: 20 (!) 22  20  Temp: 98.5 F (36.9 C) 98.5 F (36.9 C)  99.2 F (37.3 C)  TempSrc: Oral Oral  Oral  SpO2: 95% 94%  96%  Weight:   225 lb 9.6 oz (102.3 kg)   Height:        Intake/Output Summary (Last 24 hours) at 09/17/17 1102 Last data filed at 09/17/17 7921  Gross per 24 hour  Intake              240 ml  Output              500 ml  Net             -260 ml   Filed Weights   09/16/17 2131 09/17/17 0206  Weight: 222 lb 1.6 oz (100.7 kg) 225 lb 9.6 oz (102.3 kg)    Telemetry    Sinus rhythm in the 11s' with PVC's - Personally Reviewed  ECG    SR with diffuse Twave changes, similiar to previous EKGs - Personally Reviewed  Physical Exam   GEN: Obese female,  No acute distress.   Neck: No JVD Cardiac: RRR, no murmurs, rubs, or gallops.  Respiratory: Clear to auscultation bilaterally. GI: Soft, nontender, non-distended  MS: Puffiness of bil ankles, no pitting edema; No  deformity. Neuro:  Nonfocal  Psych: Normal affect   Labs    Chemistry Recent Labs Lab 09/16/17 0800 09/16/17 2222 09/17/17 0529  NA 137  --  136  K 3.2*  --  3.1*  CL 101  --  99*  CO2 21*  --  22  GLUCOSE 108*  --  96  BUN 22*  --  24*  CREATININE 1.08*  --  0.96  CALCIUM 9.1  --  9.2  PROT  --  8.2*  --   ALBUMIN  --  3.9  --   AST  --  43*  --   ALT  --  21  --   ALKPHOS  --  148*  --   BILITOT  --  1.0  --   GFRNONAA 55*  --  >60  GFRAA >60  --  >60  ANIONGAP 15  --  15     Hematology Recent Labs Lab 09/16/17 0800  WBC 4.9  RBC 5.36*  HGB 13.6  HCT 44.4  MCV 82.8  MCH 25.4*  MCHC 30.6  RDW 17.3*  PLT 208    Cardiac EnzymesNo results for input(s): TROPONINI in the last 168 hours.  Recent Labs Lab 09/16/17 0819  TROPIPOC 0.00     BNP Recent Labs Lab 09/16/17 0800  BNP 668.9*     DDimer No results for input(s): DDIMER in the last 168 hours.   Radiology    Dg Chest 2 View  Result Date: 09/16/2017 CLINICAL DATA:  Left-sided chest pain.  Shortness of breath. EXAM: CHEST  2 VIEW COMPARISON:  08/06/2017 . FINDINGS: Prior CABG. Cardiomegaly with mild bilateral interstitial prominence. Mild CHF cannot be excluded. Mild pneumonitis cannot be excluded. Prominent pleural effusion. No pneumothorax. IMPRESSION: Prior CABG with diffuse mild bilateral interstitial prominence suggesting mild CHF. Mild pneumonitis cannot be excluded . Electronically Signed   By: Marcello Moores  Register   On: 09/16/2017 09:03   Ct Angio Chest/abd/pel For Dissection W And/or Wo Contrast  Result Date: 09/16/2017 CLINICAL DATA:  Chest pain radiating into the left shoulder for several hours, initial encounter EXAM: CT ANGIOGRAPHY CHEST, ABDOMEN AND PELVIS TECHNIQUE: Multidetector CT imaging through the chest, abdomen and pelvis was performed using the standard protocol during bolus administration of intravenous contrast. Multiplanar reconstructed images and MIPs were obtained and reviewed  to evaluate the vascular anatomy. CONTRAST:  100 mL Isovue 370. COMPARISON:  05/31/2017, 03/11/2017 FINDINGS: CTA CHEST FINDINGS Cardiovascular: Thoracic aorta and its branches demonstrate mild atherosclerotic calcifications. Changes of prior coronary bypass grafting are noted. Coronary calcifications are seen. No aneurysmal dilatation of the aorta is seen. No dissection is noted. The pulmonary artery is well visualize with a normal branching pattern. No findings to suggest pulmonary embolism are identified. Mild but stable cardiac enlargement is noted. Mediastinum/Nodes: Thoracic inlet demonstrates a hypodense nodule in the left lobe of the thyroid measuring approximately 14 mm. It is somewhat better visualized on the current study. Scattered small hilar and mediastinal lymph nodes are noted but improved when compared with the prior study. The esophagus is within normal limits. Lungs/Pleura: Lungs are well aerated bilaterally. Diffuse emphysematous changes are seen. Scattered pulmonary nodules are identified. The most prominent of these lies in the right middle lobe. These are stable from prior exam. No focal infiltrate or sizable effusion is noted. Musculoskeletal: Mild degenerative changes of the thoracic spine are noted. No acute bony abnormality is seen. Review of the MIP images confirms the above findings. CTA ABDOMEN AND PELVIS FINDINGS VASCULAR Aorta: Atherosclerotic calcifications of the aorta are noted without aneurysmal dilatation. Normal aortic bifurcation is seen. Celiac: Mild stenosis is noted at the origin of the celiac axis. SMA: Patent without evidence of aneurysm, dissection, vasculitis or significant stenosis. Renals: Single renal arteries are identified bilaterally. No focal stenosis is noted. IMA: Within normal limits. Iliacs : Mild atherosclerotic disease is noted without aneurysmal dilatation or focal stenosis. Veins: No definitive venous abnormality is noted although opacification is limited.  Review of the MIP images confirms the above findings. NON-VASCULAR Hepatobiliary: Mild fatty infiltration of the liver is noted. The gallbladder is within normal limits. Pancreas: Unremarkable. No pancreatic ductal dilatation or surrounding inflammatory changes. Spleen: Normal in size without focal abnormality. Adrenals/Urinary Tract: Adrenal glands are unremarkable. Kidneys are normal, without renal calculi, focal lesion, or hydronephrosis. Bladder is unremarkable. Stomach/Bowel: Mild diverticular change of the colon is noted without evidence of diverticulitis. No obstructive or inflammatory changes are seen. The appendix is within normal limits and air filled. Lymphatic: No specific lymphadenopathy is noted. Reproductive: Status post hysterectomy.  No adnexal masses. Other: No abdominal wall hernia or abnormality. No abdominopelvic ascites. Musculoskeletal: Degenerative changes of lumbar spine are noted. No acute bony abnormality is seen. Review of the MIP images confirms the above findings. IMPRESSION: Atherosclerotic changes without evidence of aneurysmal dilatation or dissection. Mild celiac axis stenosis. Left thyroid nodule stable from previous exams. The need for nonemergent workup can be determined on a clinical basis. Scattered benign-appearing pulmonary nodule stable from multiple previous exams. Scattered chronic changes within the abdomen. No acute abnormality is noted. Electronically Signed   By: Inez Catalina M.D.   On: 09/16/2017 10:17    Cardiac Studies   Echocardiogram 05/31/2017 Study Conclusions - Left ventricle: The cavity size was normal. There was moderate focal basal hypertrophy of the septum. Systolic function was normal. The estimated ejection fraction was in the range of 50% to 55%. There is akinesis of the basalinferior myocardium. Features are consistent with a pseudonormal left ventricular filling pattern, with concomitant abnormal relaxation and increased filling  pressure (grade 2 diastolic dysfunction). - Ventricular septum: The contour showed diastolic flattening. - Aortic valve: Trileaflet; mildly thickened, mildly calcified leaflets. - Right ventricle: The cavity size was mildly dilated. Wall thickness was normal. - Right atrium: The atrium was moderately dilated. - Tricuspid valve: There was moderate regurgitation. - Pulmonary arteries: Systolic pressure was severely increased. PA peak pressure: 69 mm Hg (S). Impressions: - Compared to the prior study, there has been no significant interval change.  CARDIAC CATH: 06/02/2016 Diagnostic Diagram, pre-CABG       Patient Profile     61 y.o. female with a history of CAD s/p CABG 05/2016 w/ LIMA-LAD, SVG-OM-CFX, SVG-RCA, CHF, COPD on home oxygen, hyperlipidemia, hypertension, fibromyalgia, GERD, PAF formerly on amiodarone now on Eliquis, remote cocaine use and lupus who presented with chest pain since yesterday and also recent weight gain, DOE and bilateral lower extremity swelling. Mild CHF on CXR, negative troponin, BNP 668.9. Negative for PE.   Assessment & Plan    1. Acute on chronic diastolic CHF: EF 01-02% with grade 2 DD by echo 05/2017. Pt has not been weighing herself at home. Recent switch from lasix to torsemide at home without improvement in her symptoms. Was given a total of 200 mg of IV lasix yesterday. Wt is actually up and only 500 mld of UOP documented. Pt states that she did not note an increase in her output. SCr is stable. Pt still has DOE with minimal exertion. Has chronic orthopnea. Not much edema. Lungs are clear. May need R/L heart cath to assess grafts and measure pressures. Will given lasix 80 mg and Dr. Acie Fredrickson to see.   2. CAD: Hx of CABG X4 05/2016. On BB, ARB and statin. Not on aspirin due to need for anticoagulation. Troponin negative X1.  No chest pain.   3. PAF:  formerly on amio, CHADSVasc 6 (TIA 2, gender, hypertension, CAD, CHF) on Eiliquis.  Maintaining sinus rhythm on carvedilol.   4. Severe pulmonary hypertension: Pt has known hx of COPD, on oxygen. PFT last year showed severe diffusion capacity. Repeat PFTs pending. May benefit from vasodilator therapy.  5. Hypokalemia: K+ 3.1 this am with diuresis. Will replace. Continue to follow.   For questions or updates, please contact [place] Please consult [email or web address] for contact info under Cardiology/STEMI.      Signed, Daune Perch, NP  09/17/2017, 11:02 AM   Attending Note:   The patient was seen and examined.  Agree with assessment and plan as noted  above.  Changes made to the above note as needed.  Patient seen and independently examined with Pecolia Ades, NP.   We discussed all aspects of the encounter. I agree with the assessment and plan as stated above.  1.  Severe DOE:   Is on home oxygen at baseline.  He still complains of severe shortness of breath.  This shortness of breath with exertion is very common for her and she is about at her baseline.   We will see if we get a set of pulmonary function test today.  2.  Chest pain: Patient presented with several days of intermittent chest pain.  It was not similar to her episodes of angina.  We will continue to draw cardiac enzymes.  If she rules out and we may build to discharge her home tomorrow with close follow-up in the office.  3.  Coronary artery disease: Status post coronary artery bypass grafting  4.  Paroxysmal atrial for ablation: formerly on amio, CHADSVasc 6 (TIA 2, gender, hypertension, CAD, CHF) on Eiliquis.  Possible discharge tomorrow   I have spent a total of 40 minutes with patient reviewing hospital  notes , telemetry, EKGs, labs and examining patient as well as establishing an assessment and plan that was discussed with the patient. > 50% of time was spent in direct patient care.    Thayer Headings, Brooke Bonito., MD, [facility] [place] 09/17/2017, 12:47 PM [address]  [place] Pager [phone]

## 2017-09-18 ENCOUNTER — Other Ambulatory Visit: Payer: Self-pay | Admitting: Cardiology

## 2017-09-18 DIAGNOSIS — I5033 Acute on chronic diastolic (congestive) heart failure: Secondary | ICD-10-CM

## 2017-09-18 DIAGNOSIS — R0600 Dyspnea, unspecified: Secondary | ICD-10-CM

## 2017-09-18 DIAGNOSIS — J42 Unspecified chronic bronchitis: Secondary | ICD-10-CM

## 2017-09-18 DIAGNOSIS — I480 Paroxysmal atrial fibrillation: Secondary | ICD-10-CM

## 2017-09-18 DIAGNOSIS — J810 Acute pulmonary edema: Secondary | ICD-10-CM

## 2017-09-18 LAB — TROPONIN I: Troponin I: <0.03 ng/mL (ref <0.03)

## 2017-09-18 LAB — BASIC METABOLIC PANEL
ANION GAP: 12 (ref 5–15)
BUN: 21 mg/dL — ABNORMAL HIGH (ref 6–20)
CHLORIDE: 101 mmol/L (ref 101–111)
CO2: 22 mmol/L (ref 22–32)
CREATININE: 1.02 mg/dL — AB (ref 0.44–1.00)
Calcium: 9.1 mg/dL (ref 8.9–10.3)
GFR calc non Af Amer: 59 mL/min — ABNORMAL LOW (ref >60)
Glucose, Bld: 93 mg/dL (ref 65–99)
POTASSIUM: 4 mmol/L (ref 3.5–5.1)
SODIUM: 135 mmol/L (ref 135–145)

## 2017-09-18 MED ORDER — NITROGLYCERIN 0.4 MG SL SUBL: 0.4000 mg | SUBLINGUAL_TABLET | SUBLINGUAL | 12 refills | Status: AC | PRN

## 2017-09-18 MED ORDER — GUAIFENESIN-DM 100-10 MG/5ML PO SYRP
10.0000 mL | ORAL_SOLUTION | Freq: Four times a day (QID) | ORAL | Status: DC | PRN
  Administered 2017-09-18 – 2017-09-23 (×6): 10 mL via ORAL
  Filled 2017-09-18 (×6): qty 10

## 2017-09-18 MED ORDER — POTASSIUM CHLORIDE CRYS ER 20 MEQ PO TBCR: 20.0000 meq | EXTENDED_RELEASE_TABLET | Freq: Every day | ORAL | 4 refills | Status: DC

## 2017-09-18 MED ORDER — TORSEMIDE 20 MG PO TABS: 60.0000 mg | ORAL_TABLET | Freq: Two times a day (BID) | ORAL | 3 refills | Status: DC

## 2017-09-18 NOTE — Progress Notes (Signed)
Called by RN to talk with patient. Stating she wants to stay and have cardiac cath done this admission. Will discuss with MD regarding plans.   SignedReino Bellis, NP-C 09/18/2017, 2:19 PM Pager: [phone]

## 2017-09-18 NOTE — Progress Notes (Signed)
Pt refusing to have IVs removed. Pt states "some MD told me I needed a cardiac catheterization" Called cardiology, awaiting call back

## 2017-09-18 NOTE — Progress Notes (Addendum)
Progress Note  Patient Name: Elizabeth [name] Date of Encounter: 09/18/2017  Primary Cardiologist: Dr. Sallyanne Kuster  Subjective   The patient has no more chest pain. SOB even while talking, but hasn't walked yet.  Inpatient Medications    Scheduled Meds: . apixaban  5 mg Oral BID  . carvedilol  12.5 mg Oral BID WC  . colchicine  0.6 mg Oral BID  . fluticasone furoate-vilanterol  1 puff Inhalation Daily   And  . umeclidinium bromide  1 puff Inhalation Daily  . irbesartan  150 mg Oral Daily  . pantoprazole  40 mg Oral Daily  . potassium chloride  40 mEq Oral BID  . sodium chloride flush  3 mL Intravenous Q12H   Continuous Infusions: . sodium chloride     PRN Meds: sodium chloride, acetaminophen, albuterol, ALPRAZolam, nitroGLYCERIN, ondansetron (ZOFRAN) IV, sodium chloride flush, zolpidem   Vital Signs    Vitals:   09/17/17 1209 09/17/17 2010 09/18/17 0519 09/18/17 0819  BP: (!) 96/58 108/77 109/75   Pulse: 80 81 76   Resp: _0 Temp: 98.2 F (36.8 C) 98.6 F (37 C) 98.2 F (36.8 C)   TempSrc: Oral Oral Oral   SpO2: 98% 93% 93% 93%  Weight:   225 lb 8 oz (102.3 kg)   Height:        Intake/Output Summary (Last 24 hours) at 09/18/17 0916 Last data filed at 09/18/17 0600  Gross per 24 hour  Intake                0 ml  Output                0 ml  Net                0 ml   Filed Weights   09/16/17 2131 09/17/17 0206 09/18/17 0519  Weight: 222 lb 1.6 oz (100.7 kg) 225 lb 9.6 oz (102.3 kg) 225 lb 8 oz (102.3 kg)    Telemetry    Sinus rhythm in the 26s' with PVC's - Personally Reviewed  ECG    SR with diffuse Twave changes, similiar to previous EKGs - Personally Reviewed  Physical Exam   GEN: Obese female,  No acute distress.   Neck: No JVD Cardiac: RRR, no murmurs, rubs, or gallops.  Respiratory: Clear to auscultation bilaterally. GI: Soft, nontender, non-distended  MS: Puffiness of bil ankles, no pitting edema; No deformity. Neuro:   Nonfocal  Psych: Normal affect   Labs    Chemistry  Recent Labs Lab 09/16/17 0800 09/16/17 2222 09/17/17 0529 09/18/17 0018  NA 137  --  136 135  K 3.2*  --  3.1* 4.0  CL 101  --  99* 101  CO2 21*  --  22 22  GLUCOSE 108*  --  96 93  BUN 22*  --  24* 21*  CREATININE 1.08*  --  0.96 1.02*  CALCIUM 9.1  --  9.2 9.1  PROT  --  8.2*  --   --   ALBUMIN  --  3.9  --   --   AST  --  43*  --   --   ALT  --  21  --   --   ALKPHOS  --  148*  --   --   BILITOT  --  1.0  --   --   GFRNONAA 55*  --  >60 59*  GFRAA >60  --  >60 >  60  ANIONGAP 15  --  15 12     Hematology  Recent Labs Lab 09/16/17 0800  WBC 4.9  RBC 5.36*  HGB 13.6  HCT 44.4  MCV 82.8  MCH 25.4*  MCHC 30.6  RDW 17.3*  PLT 208    Cardiac Enzymes  Recent Labs Lab 09/17/17 1832 09/18/17 0018  TROPONINI <0.03 <0.03     Recent Labs Lab 09/16/17 0819  TROPIPOC 0.00     BNP  Recent Labs Lab 09/16/17 0800  BNP 668.9*     DDimer No results for input(s): DDIMER in the last 168 hours.   Radiology    Ct Angio Chest/abd/pel For Dissection W And/or Wo Contrast  Result Date: 09/16/2017  IMPRESSION: Atherosclerotic changes without evidence of aneurysmal dilatation or dissection. Mild celiac axis stenosis. Left thyroid nodule stable from previous exams. The need for nonemergent workup can be determined on a clinical basis. Scattered benign-appearing pulmonary nodule stable from multiple previous exams. Scattered chronic changes within the abdomen. No acute abnormality is noted.   Cardiac Studies   Echocardiogram 05/31/2017 Study Conclusions - Left ventricle: The cavity size was normal. There was moderate focal basal hypertrophy of the septum. Systolic function was normal. The estimated ejection fraction was in the range of 50% to 55%. There is akinesis of the basalinferior myocardium. Features are consistent with a pseudonormal left ventricular filling pattern, with concomitant  abnormal relaxation and increased filling pressure (grade 2 diastolic dysfunction). - Ventricular septum: The contour showed diastolic flattening. - Aortic valve: Trileaflet; mildly thickened, mildly calcified leaflets. - Right ventricle: The cavity size was mildly dilated. Wall thickness was normal. - Right atrium: The atrium was moderately dilated. - Tricuspid valve: There was moderate regurgitation. - Pulmonary arteries: Systolic pressure was severely increased. PA peak pressure: 69 mm Hg (S). Impressions: - Compared to the prior study, there has been no significant interval change.  CARDIAC CATH: 06/02/2016 Diagnostic Diagram, pre-CABG       Patient Profile     61 y.o. female with a history of CAD s/p CABG 05/2016 w/ LIMA-LAD, SVG-OM-CFX, SVG-RCA, CHF, COPD on home oxygen, hyperlipidemia, hypertension, fibromyalgia, GERD, PAF formerly on amiodarone now on Eliquis, remote cocaine use and lupus who presented with chest pain since yesterday and also recent weight gain, DOE and bilateral lower extremity swelling. Mild CHF on CXR, negative troponin, BNP 668.9. Negative for PE.   Assessment & Plan    1. Acute on chronic diastolic CHF: EF 06-23% with grade 2 DD by echo 05/2017. She is now euvolemic, we will restart Torsemide 20 mg PO BID.  2. CAD: Hx of CABG X4 05/2016. On BB, ARB and statin. Not on aspirin due to need for anticoagulation. Troponin negative X3. We will arrange for a left and right cath on Monday. Hold Eliquis, start heparin drip Start atorvastatin 80 mg po daily  3. PAF:  formerly on amio, CHADSVasc 6 (TIA 2, gender, hypertension, CAD, CHF) on Elliquis. Maintaining sinus rhythm on carvedilol. Hb 13.6  4. Severe pulmonary hypertension: Pt has known hx of COPD, on oxygen. PFT last year showed severe diffusion capacity. Repeat PFTs showed Moderate Obstructive Airways Disease, Response to bronchodilator, Minimal Restriction -Parenchymal, Severe Diffusion  Defect. The diffusion defect and reduced lung volumes suggest an early parenchymal process. Moderate airway obstruction is present. A clinical trial of bronchodilators may be beneficial in view of the airway obstruction. In view of the severity of the diffusion defect, studies with exercise would be helpful to evaluate the  presence of hypoxemia. We will arrange for a pulmonary to see.  5. Hypokalemia: K+ 3.1 this am with diuresis. Replaced. Today 4.0.  She is at her baseline, we will discharge home.  Ena Dawley, MD 09/18/2017

## 2017-09-18 NOTE — Discharge Summary (Signed)
Discharge Summary    Patient ID: Elizabeth [name],  MRN: [id number], DOB/AGE: 1956-03-31 61 y.o.  Admit date: 09/16/2017 Discharge date: 09/18/2017  Primary Care Provider: Ledell Noss Primary Cardiologist: Dr. Sallyanne Kuster  Discharge Diagnoses    Active Problems:   Acute on chronic diastolic CHF (congestive heart failure) ([place])   Allergies No Known Allergies  Diagnostic Studies/Procedures    None _____________   History of Present Illness     Elizabeth [name] is a 61 y.o. female with a history of CAD s/p CABG 05/2016 w/ LIMA-LAD, SVG-OM-CFX, SVG-RCA, CHF, COPD on home oxygen, hyperlipidemia, hypertension, fibromyalgia, GERD, PAF formerly on amiodarone now on Eliquis, remote cocaine use and lupus who presented with chest pain since yesterday and also recent weight gain and bilateral lower extremity swelling.   Ms. Gasner developed chest pain starting on the morning of 10/31 that seemed to get better but then returned on 11/1 morning it came back and was worse.  Initially she described it as as feeling like her previous heart attack with pressure type pain but progressed to more of a stabbing pain that radiated to her back.  This is not like pain she has had before.  She also had a HA. She took Tylenol with improvement in her symptoms.   She states that she had difficulty breathing over the last month and has had to increase her home oxygen.  She has a productive cough but no fever.  She has had recent weight gain and bilateral lower extremity edema.   The patient was last seen in our office on 09/07/17 by Rosaria Ferries, PA at which time the patient was mildly volume overloaded by exam and the patient is known to not tolerate volume overload very well.  The patient was having some orthopnea and PND and was suspected to have sleep apnea.  Her Lasix was changed to Demadex 60 mg daily and the patient was provided with a scale to follow daily weights (but forgot it).  She was  advised on a 2 g sodium diet and 2 L fluid restriction.  On 11/1, she woke again with chest pain and SOB. The pain was going through her chest. No change with deep inspiration. She ended up calling EMS, ASA and SL NTG did not change the pain.  The patient had a cardiac catheterization in 2017 after a high risk nuclear scan and was found to have severe three-vessel CAD and was taken for coronary artery bypass grafting on 06/08/2016.  EF was down to 40-45% at the time of her surgery.  Echocardiogram in 05/2017 showed LVEF 50-55%, akinesis of the basal inferior myocardium, grade 2 diastolic dysfunction, moderate TR, PA peak pressure 69 mmHg.  Lower extremity Dopplers were negative for DVT on 09/02/17.   Significant findings: Troponin  0.00 BNP 668.9 Potassium 3.2, creatinine 1.08     Chest x-ray: Prior CABG with diffuse mild bilateral interstitial prominence suggesting mild CHF: Mild pneumonitis cannot be excluded  CTA of the chest: No evidence of aneurysmal dilatation or dissection or PE.  Scattered benign looking pulmonary nodules.  Diffuse emphysematous changes no focal infiltrate or sizable effusion.  Hospital Course     Consultants: None  The patient was admitted with acute on chronic diastolic heart failure.  Last echo in 05/2017 showed EF of 50-55% with grade 2 diastolic dysfunction.  The patient was diuresed with IV Lasix and is currently euvolemic.  She will be discharged home on torsemide 20 mg twice daily.  The patient has a history of CAD with CABG x4 and 05/2016.  Continue on the blocker, ARB and statin.  No aspirin due to need for anticoagulation.  Troponins were negative x3 and she is no longer having chest pain.  Will follow up in the clinic.  The patient also has a history of paroxysmal atrial fibrillation she is currently maintaining sinus rhythm on carvedilol.  She takes Eliquis for stroke risk reduction with CHADSVasc 6 (TIA 2, gender, hypertension, CAD, CHF).  She was  hypokalemic with potassium of 3.1 after diuresis.  Potassium was replaced.  Today her potassium is 4.0.  The patient is currently at her baseline and will be discharged home. Discharge wt is 225 lbs. She has been advised on 2 gm sodium diet and daily weights.   The patient has severe pulmonary hypertension with known hx of COPD, on oxygen. PFT last year showed severe diffusion capacity. Repeat PFTs showed Moderate Obstructive Airways Disease, Response to bronchodilator, Minimal Restriction -Parenchymal, Severe Diffusion Defect. The diffusion defect and reduced lung volumes suggest an early parenchymal process. Moderate airway obstruction is present. A clinical trial of bronchodilators may be beneficial in view of the airway obstruction. In view of the severity of the diffusion defect, studies with exercise would be helpful to evaluate the presence of hypoxemia. We will arrange for a pulmonary follow up.  Patient has been seen by Dr. Meda Coffee today and deemed ready for discharge home. All follow up appointments have been scheduled. Discharge medications are listed below. _____________  Discharge Vitals Blood pressure 109/75, pulse 76, temperature 98.2 F (36.8 C), temperature source Oral, resp. rate 18, height _0  (1.575 m), weight 225 lb 8 oz (102.3 kg), last menstrual period 12/30/1994, SpO2 93 %.  Filed Weights   09/16/17 2131 09/17/17 0206 09/18/17 0519  Weight: 222 lb 1.6 oz (100.7 kg) 225 lb 9.6 oz (102.3 kg) 225 lb 8 oz (102.3 kg)    Labs & Radiologic Studies    CBC  Recent Labs  09/16/17 0800  WBC 4.9  HGB 13.6  HCT 44.4  MCV 82.8  PLT 779   Basic Metabolic Panel  Recent Labs  09/17/17 0529 09/18/17 0018  NA 136 135  K 3.1* 4.0  CL 99* 101  CO2 22 22  GLUCOSE 96 93  BUN 24* 21*  CREATININE 0.96 1.02*  CALCIUM 9.2 9.1   Liver Function Tests  Recent Labs  09/16/17 2222  AST 43*  ALT 21  ALKPHOS 148*  BILITOT 1.0  PROT 8.2*  ALBUMIN 3.9   No results for  input(s): LIPASE, AMYLASE in the last 72 hours. Cardiac Enzymes  Recent Labs  09/17/17 1832 09/18/17 0018  TROPONINI <0.03 <0.03   BNP Invalid input(s): POCBNP D-Dimer No results for input(s): DDIMER in the last 72 hours. Hemoglobin A1C No results for input(s): HGBA1C in the last 72 hours. Fasting Lipid Panel No results for input(s): CHOL, HDL, LDLCALC, TRIG, CHOLHDL, LDLDIRECT in the last 72 hours. Thyroid Function Tests No results for input(s): TSH, T4TOTAL, T3FREE, THYROIDAB in the last 72 hours.  Invalid input(s): FREET3 _____________  Dg Chest 2 View  Result Date: 09/16/2017 CLINICAL DATA:  Left-sided chest pain.  Shortness of breath. EXAM: CHEST  2 VIEW COMPARISON:  08/06/2017 . FINDINGS: Prior CABG. Cardiomegaly with mild bilateral interstitial prominence. Mild CHF cannot be excluded. Mild pneumonitis cannot be excluded. Prominent pleural effusion. No pneumothorax. IMPRESSION: Prior CABG with diffuse mild bilateral interstitial prominence suggesting mild CHF. Mild pneumonitis cannot be  excluded . Electronically Signed   By: Marcello Moores  Register   On: 09/16/2017 09:03   Dg Foot Complete Right  Result Date: 09/02/2017 CLINICAL DATA:  Swelling and discoloration of the right foot for 1 month. History of gout. EXAM: RIGHT FOOT COMPLETE - 3+ VIEW COMPARISON:  None. FINDINGS: Subchondral irregularity on both sides of the great toe interphalangeal joint, lateral eccentric. Patient has history of gout, although these are central erosions rather than juxta-articular and other erosive arthropathy should be considered. History of symptoms for 1 month. No soft tissue calcification. First MTP osteoarthritis with spurring and mild joint narrowing. Postoperative third metatarsal. Osteopenic appearance for age. IMPRESSION: 1. Great toe interphalangeal erosive arthropathy that is nonspecific. Please ensure no infectious symptoms. 2. First MTP joint osteoarthritis. Electronically Signed   By: Monte Fantasia M.D.   On: 09/02/2017 13:48   Ct Angio Chest/abd/pel For Dissection W And/or Wo Contrast  Result Date: 09/16/2017 CLINICAL DATA:  Chest pain radiating into the left shoulder for several hours, initial encounter EXAM: CT ANGIOGRAPHY CHEST, ABDOMEN AND PELVIS TECHNIQUE: Multidetector CT imaging through the chest, abdomen and pelvis was performed using the standard protocol during bolus administration of intravenous contrast. Multiplanar reconstructed images and MIPs were obtained and reviewed to evaluate the vascular anatomy. CONTRAST:  100 mL Isovue 370. COMPARISON:  05/31/2017, 03/11/2017 FINDINGS: CTA CHEST FINDINGS Cardiovascular: Thoracic aorta and its branches demonstrate mild atherosclerotic calcifications. Changes of prior coronary bypass grafting are noted. Coronary calcifications are seen. No aneurysmal dilatation of the aorta is seen. No dissection is noted. The pulmonary artery is well visualize with a normal branching pattern. No findings to suggest pulmonary embolism are identified. Mild but stable cardiac enlargement is noted. Mediastinum/Nodes: Thoracic inlet demonstrates a hypodense nodule in the left lobe of the thyroid measuring approximately 14 mm. It is somewhat better visualized on the current study. Scattered small hilar and mediastinal lymph nodes are noted but improved when compared with the prior study. The esophagus is within normal limits. Lungs/Pleura: Lungs are well aerated bilaterally. Diffuse emphysematous changes are seen. Scattered pulmonary nodules are identified. The most prominent of these lies in the right middle lobe. These are stable from prior exam. No focal infiltrate or sizable effusion is noted. Musculoskeletal: Mild degenerative changes of the thoracic spine are noted. No acute bony abnormality is seen. Review of the MIP images confirms the above findings. CTA ABDOMEN AND PELVIS FINDINGS VASCULAR Aorta: Atherosclerotic calcifications of the aorta are noted without  aneurysmal dilatation. Normal aortic bifurcation is seen. Celiac: Mild stenosis is noted at the origin of the celiac axis. SMA: Patent without evidence of aneurysm, dissection, vasculitis or significant stenosis. Renals: Single renal arteries are identified bilaterally. No focal stenosis is noted. IMA: Within normal limits. Iliacs : Mild atherosclerotic disease is noted without aneurysmal dilatation or focal stenosis. Veins: No definitive venous abnormality is noted although opacification is limited. Review of the MIP images confirms the above findings. NON-VASCULAR Hepatobiliary: Mild fatty infiltration of the liver is noted. The gallbladder is within normal limits. Pancreas: Unremarkable. No pancreatic ductal dilatation or surrounding inflammatory changes. Spleen: Normal in size without focal abnormality. Adrenals/Urinary Tract: Adrenal glands are unremarkable. Kidneys are normal, without renal calculi, focal lesion, or hydronephrosis. Bladder is unremarkable. Stomach/Bowel: Mild diverticular change of the colon is noted without evidence of diverticulitis. No obstructive or inflammatory changes are seen. The appendix is within normal limits and air filled. Lymphatic: No specific lymphadenopathy is noted. Reproductive: Status post hysterectomy. No adnexal masses. Other: No  abdominal wall hernia or abnormality. No abdominopelvic ascites. Musculoskeletal: Degenerative changes of lumbar spine are noted. No acute bony abnormality is seen. Review of the MIP images confirms the above findings. IMPRESSION: Atherosclerotic changes without evidence of aneurysmal dilatation or dissection. Mild celiac axis stenosis. Left thyroid nodule stable from previous exams. The need for nonemergent workup can be determined on a clinical basis. Scattered benign-appearing pulmonary nodule stable from multiple previous exams. Scattered chronic changes within the abdomen. No acute abnormality is noted. Electronically Signed   By: Inez Catalina M.D.   On: 09/16/2017 10:17   Disposition   Pt is being discharged home today in good condition.  Follow-up Plans & Appointments    Follow-up Information    Barrett, Evelene Croon, PA-C Follow up on 09/29/2017.   Specialties:  Cardiology, Radiology Why:  at 9:30 for cardiology hospital follow up.  Contact information: [address] [place] [address] [phone]          Discharge Instructions    (HEART FAILURE PATIENTS) Call MD:  Anytime you have any of the following symptoms: 1) 3 pound weight gain in 24 hours or 5 pounds in 1 week 2) shortness of breath, with or without a dry hacking cough 3) swelling in the hands, feet or stomach 4) if you have to sleep on extra pillows at night in order to breathe.    Complete by:  As directed    Diet - low sodium heart healthy    Complete by:  As directed    Increase activity slowly    Complete by:  As directed       Discharge Medications   Current Discharge Medication List    START taking these medications   Details  nitroGLYCERIN (NITROSTAT) 0.4 MG SL tablet Place 1 tablet (0.4 mg total) under the tongue every 5 (five) minutes x 3 doses as needed for chest pain. Qty: 25 tablet, Refills: 12    potassium chloride SA (K-DUR,KLOR-CON) 20 MEQ tablet Take 1 tablet (20 mEq total) by mouth daily. Qty: 30 tablet, Refills: 4      CONTINUE these medications which have CHANGED   Details  torsemide (DEMADEX) 20 MG tablet Take 3 tablets (60 mg total) by mouth 2 (two) times daily. Ok to increase to 4 tab prn swelling/wt gain >3#/QD -or- >5#/WEEK Qty: 112 tablet, Refills: 3      CONTINUE these medications which have NOT CHANGED   Details  acetaminophen (TYLENOL) 500 MG tablet Take 500 mg by mouth every 6 (six) hours as needed for mild pain.    albuterol (PROVENTIL HFA;VENTOLIN HFA) 108 (90 Base) MCG/ACT inhaler Inhale 2 puffs into the lungs every 6 (six) hours as needed for wheezing or shortness of breath. Qty: 1  Inhaler, Refills: 2    albuterol (PROVENTIL) (2.5 MG/3ML) 0.083% nebulizer solution Take 3 mLs (2.5 mg total) by nebulization every 6 (six) hours as needed for wheezing or shortness of breath. Qty: 120 vial, Refills: 5    apixaban (ELIQUIS) 5 MG TABS tablet Take 1 tablet (5 mg total) by mouth 2 (two) times daily. Qty: 60 tablet, Refills: 4    carvedilol (COREG) 12.5 MG tablet TAKE 1 TABLET(12.5 MG) BY MOUTH TWICE DAILY Qty: 180 tablet, Refills: 3   Associated Diagnoses: Essential hypertension    colchicine 0.6 MG tablet Take 1 tablet (0.6 mg total) by mouth 2 (two) times daily. Qty: 30 tablet, Refills: 0    Fluticasone-Umeclidin-Vilant (TRELEGY ELLIPTA) 100-62.5-25 MCG/INH AEPB Inhale 1  puff into the lungs daily. Qty: 1 each, Refills: 5    pantoprazole (PROTONIX) 40 MG tablet TAKE 1 TABLET(40 MG) BY MOUTH DAILY Qty: 90 tablet, Refills: 3    valsartan (DIOVAN) 320 MG tablet Take 320 mg by mouth daily. Refills: 11    rosuvastatin (CRESTOR) 20 MG tablet Take 1 tablet (20 mg total) by mouth at bedtime. Qty: 90 tablet, Refills: 3      STOP taking these medications     furosemide (LASIX) 40 MG tablet           Outstanding Labs/Studies     Duration of Discharge Encounter   Greater than 30 minutes including physician time.  Signed, Daune Perch NP 09/18/2017, 11:52 AM

## 2017-09-18 NOTE — Progress Notes (Signed)
PA Mancel Bale stated do not discharge  Pt aware

## 2017-09-18 NOTE — Consult Note (Signed)
PULMONARY / CRITICAL CARE MEDICINE   Name: Elizabeth [name] MRN: [id number] DOB: 1956/06/27    ADMISSION DATE:  09/16/2017 CONSULTATION DATE: 09/18/2017    CHIEF COMPLAINT:  Dyspnea  HISTORY OF PRESENT ILLNESS:   This is a 61 year old with a history of coronary artery disease who is also on home O2 for chronic hypoxia.  She presented complaining of increasing dyspnea and increasing oxygen requirements at home.  She has also been having midline chest pain, which she says will come on at times without provocation.  There is no radiation into the neck or arms.  There is no associated nausea or vomiting.  She has a history of CHF, and has been treated with diuresis but her dyspnea persists.  A CTA was performed on 11/1 and showed scattered small nodules which are stable, some minimal diffuse emphysematous changes, and no evidence of pulmonary embolism.  Should be noted that the patient is chronically anticoagulated.  He does have a history of a positive ANA, titer of 1-80 but the only complaint she has of joint pain is in her right great toe.  She has no skin changes or dry eyes.  Her most recent PFTs were performed on 06/04/2016 showed an FEV1 to FEC ratio of 72%.  DLCO was reduced to 46% of predicted.  Vital capacity was 78% of predicted.  Her most recent echocardiogram performed on 7/20 showed an ejection fraction of 50-55% and an estimated PA pressure 69.  She has never had a formal sleep study, she does report awakening with severe headaches, no daytime somnolence.  PAST MEDICAL HISTORY :  She  has a past medical history of CAD (coronary artery disease); CHF (congestive heart failure) ([place]) (05/31/2017); COPD (chronic obstructive pulmonary disease) ([place]) (11/17/2016); Depression; Fibroids; GERD (gastroesophageal reflux disease); Gout; HLD (hyperlipidemia); Hypertension; Lupus (2009); On home oxygen therapy; Pulmonary nodules (05/2008); and Stroke ([place]) (04/2014).  PAST SURGICAL HISTORY: She  has  a past surgical history that includes Colonoscopy (N/A, 07/14/2013); ORIF tibia plateau (Left, 08/22/2013); TEE without cardioversion (N/A, 06/08/2016); Fracture surgery; Coronary artery bypass graft (N/A, 06/08/2016); Vaginal hysterectomy (05/2003); Tubal ligation; Coronary angioplasty with stent; and Cardiac catheterization (N/A, 06/02/2016).  No Known Allergies  No current facility-administered medications on file prior to encounter.    Current Outpatient Prescriptions on File Prior to Encounter  Medication Sig  . albuterol (PROVENTIL HFA;VENTOLIN HFA) 108 (90 Base) MCG/ACT inhaler Inhale 2 puffs into the lungs every 6 (six) hours as needed for wheezing or shortness of breath.  Marland Kitchen albuterol (PROVENTIL) (2.5 MG/3ML) 0.083% nebulizer solution Take 3 mLs (2.5 mg total) by nebulization every 6 (six) hours as needed for wheezing or shortness of breath.  Marland Kitchen apixaban (ELIQUIS) 5 MG TABS tablet Take 1 tablet (5 mg total) by mouth 2 (two) times daily.  . carvedilol (COREG) 12.5 MG tablet TAKE 1 TABLET(12.5 MG) BY MOUTH TWICE DAILY  . colchicine 0.6 MG tablet Take 1 tablet (0.6 mg total) by mouth 2 (two) times daily.  . Fluticasone-Umeclidin-Vilant (TRELEGY ELLIPTA) 100-62.5-25 MCG/INH AEPB Inhale 1 puff into the lungs daily.  . pantoprazole (PROTONIX) 40 MG tablet TAKE 1 TABLET(40 MG) BY MOUTH DAILY  . valsartan (DIOVAN) 320 MG tablet Take 320 mg by mouth daily.  . rosuvastatin (CRESTOR) 20 MG tablet Take 1 tablet (20 mg total) by mouth at bedtime. (Patient not taking: Reported on 09/16/2017)    FAMILY HISTORY:  Her indicated that her mother is deceased. She indicated that her sister is deceased. She indicated that  her brother is deceased. She indicated that the status of her neg hx is unknown.    SOCIAL HISTORY: She  reports that she quit smoking about 10 months ago. Her smoking use included Cigarettes. She has a 5.40 pack-year smoking history. She has never used smokeless tobacco. She reports that she  drinks about 8.4 oz of alcohol per week . She reports that she does not use drugs.  REVIEW OF SYSTEMS:   As above  SUBJECTIVE: Above  VITAL SIGNS: BP 95/72 (BP Location: Left Arm)   Pulse 73   Temp 97.9 F (36.6 C) (Oral)   Resp 20   Ht _0  (1.575 m)   Wt 225 lb 8 oz (102.3 kg)   LMP 12/30/1994   SpO2 97%   BMI 41.24 kg/m     INTAKE / OUTPUT: I/O last 3 completed shifts: In: 240 [P.O.:240] Out: 500 [Urine:500]  PHYSICAL EXAMINATION: General: This is an obese elderly female who is obviously dyspneic with minimal exertion Neuro: Appropriate Cardiovascular: S1 and S2 are regular without murmur rub or gallop.  The currently is no dependent edema.  She has no JVD. Lungs: But she becomes tachypneic with very minimal exertion.  There is only fair air movement throughout, I to E ratio is normal.  There are no wheezes. Abdomen: Obese and soft without any obvious organomegaly masses or tenderness   LABS:  BMET  Recent Labs Lab 09/16/17 0800 09/17/17 0529 09/18/17 0018  NA 137 136 135  K 3.2* 3.1* 4.0  CL 101 99* 101  CO2 21* 22 22  BUN 22* 24* 21*  CREATININE 1.08* 0.96 1.02*  GLUCOSE 108* 96 93    Electrolytes  Recent Labs Lab 09/16/17 0800 09/17/17 0529 09/18/17 0018  CALCIUM 9.1 9.2 9.1    CBC  Recent Labs Lab 09/16/17 0800  WBC 4.9  HGB 13.6  HCT 44.4  PLT 208    Coag's No results for input(s): APTT, INR in the last 168 hours.  Sepsis Markers No results for input(s): LATICACIDVEN, PROCALCITON, O2SATVEN in the last 168 hours.  ABG No results for input(s): PHART, PCO2ART, PO2ART in the last 168 hours.  Liver Enzymes  Recent Labs Lab 09/16/17 2222  AST 43*  ALT 21  ALKPHOS 148*  BILITOT 1.0  ALBUMIN 3.9    Cardiac Enzymes  Recent Labs Lab 09/17/17 1832 09/18/17 0018  TROPONINI <0.03 <0.03    Glucose No results for input(s): GLUCAP in the last 168 hours.  Imaging No results found.     DISCUSSION: Is a  61 year old with known coronary artery disease and what appears to be diastolic heart failure who is having persistent severe dyspnea despite diuresis.  CTA of the chest with no evidence of pulmonary embolism and no significant parenchymal disease other than some minor bullous disease.  Recent PFT showing major diffusion defect.  We are awaiting a right and left heart cath 11/5 to definitively determine what component of her dyspnea and intermittent chest pain are secondary to cardiac factors.  In the interim I have ordered rheumatoid factor, and ANA.   Lars Masson, MD Critical Care Medicine [facility] Pager: [phone]  09/18/2017, 5:21 PM

## 2017-09-18 NOTE — Progress Notes (Signed)
PA Mancel Bale at bedside

## 2017-09-18 NOTE — Discharge Instructions (Addendum)
STOP aspirin after 30 days You MUST stick to a 2000 mg sodium per day diet Please obtain scales and do daily weights Record these and call us for gains of 3 pounds in a day or 5 pounds in a week Please schedule your sleep study.   Information on my medicine - ELIQUIS (apixaban)  This medication education was reviewed with me or my healthcare representative as part of my discharge preparation.  The pharmacist that spoke with me during my hospital stay was:  Wayland Salinas, [facility]  Why was Eliquis prescribed for you? Eliquis was prescribed for you to reduce the risk of a blood clot forming that can cause a stroke if you have a medical condition called atrial fibrillation (a type of irregular heartbeat).  What do You need to know about Eliquis ? Take your Eliquis TWICE DAILY - one tablet in the morning and one tablet in the evening with or without food. If you have difficulty swallowing the tablet whole please discuss with your pharmacist how to take the medication safely.  Take Eliquis exactly as prescribed by your doctor and DO NOT stop taking Eliquis without talking to the doctor who prescribed the medication.  Stopping may increase your risk of developing a stroke.  Refill your prescription before you run out.  After discharge, you should have regular check-up appointments with your healthcare provider that is prescribing your Eliquis.  In the future your dose may need to be changed if your kidney function or weight changes by a significant amount or as you get older.  What do you do if you miss a dose? If you miss a dose, take it as soon as you remember on the same day and resume taking twice daily.  Do not take more than one dose of ELIQUIS at the same time to make up a missed dose.  Important Safety Information A possible side effect of Eliquis is bleeding. You should call your healthcare provider right away if you experience any of the following: ? Bleeding from an  injury or your nose that does not stop. ? Unusual colored urine (red or dark brown) or unusual colored stools (red or black). ? Unusual bruising for unknown reasons. ? A serious fall or if you hit your head (even if there is no bleeding).  Some medicines may interact with Eliquis and might increase your risk of bleeding or clotting while on Eliquis. To help avoid this, consult your healthcare provider or pharmacist prior to using any new prescription or non-prescription medications, including herbals, vitamins, non-steroidal anti-inflammatory drugs (NSAIDs) and supplements.  This website has more information on Eliquis (apixaban): http://www.eliquis.com/eliquis/home

## 2017-09-19 DIAGNOSIS — Z951 Presence of aortocoronary bypass graft: Secondary | ICD-10-CM

## 2017-09-19 DIAGNOSIS — R0602 Shortness of breath: Secondary | ICD-10-CM

## 2017-09-19 DIAGNOSIS — J431 Panlobular emphysema: Secondary | ICD-10-CM

## 2017-09-19 LAB — BASIC METABOLIC PANEL
ANION GAP: 11 (ref 5–15)
BUN: 16 mg/dL (ref 6–20)
CHLORIDE: 104 mmol/L (ref 101–111)
CO2: 22 mmol/L (ref 22–32)
Calcium: 9.4 mg/dL (ref 8.9–10.3)
Creatinine, Ser: 0.88 mg/dL (ref 0.44–1.00)
Glucose, Bld: 96 mg/dL (ref 65–99)
POTASSIUM: 4.4 mmol/L (ref 3.5–5.1)
SODIUM: 137 mmol/L (ref 135–145)

## 2017-09-19 MED ORDER — HEPARIN BOLUS VIA INFUSION
4000.0000 [IU] | Freq: Once | INTRAVENOUS | Status: AC
  Administered 2017-09-19: 4000 [IU] via INTRAVENOUS
  Filled 2017-09-19: qty 4000

## 2017-09-19 MED ORDER — TORSEMIDE 20 MG PO TABS
20.0000 mg | ORAL_TABLET | Freq: Two times a day (BID) | ORAL | Status: DC
  Administered 2017-09-19 – 2017-09-21 (×4): 20 mg via ORAL
  Filled 2017-09-19 (×5): qty 1

## 2017-09-19 MED ORDER — HEPARIN (PORCINE) IN NACL 100-0.45 UNIT/ML-% IJ SOLN
1050.0000 [IU]/h | INTRAMUSCULAR | Status: DC
  Administered 2017-09-19: 1050 [IU]/h via INTRAVENOUS
  Filled 2017-09-19: qty 250

## 2017-09-19 NOTE — Progress Notes (Addendum)
Progress Note  Patient Name: Elizabeth [name] Date of Encounter: 09/19/2017  Primary Cardiologist: Dr. Sallyanne Kuster  Subjective   No improvement in SOB.  Inpatient Medications    Scheduled Meds: . carvedilol  12.5 mg Oral BID WC  . colchicine  0.6 mg Oral BID  . fluticasone furoate-vilanterol  1 puff Inhalation Daily   And  . umeclidinium bromide  1 puff Inhalation Daily  . irbesartan  150 mg Oral Daily  . pantoprazole  40 mg Oral Daily  . potassium chloride  40 mEq Oral BID  . sodium chloride flush  3 mL Intravenous Q12H   Continuous Infusions: . sodium chloride     PRN Meds: sodium chloride, acetaminophen, albuterol, ALPRAZolam, guaiFENesin-dextromethorphan, nitroGLYCERIN, ondansetron (ZOFRAN) IV, sodium chloride flush, zolpidem   Vital Signs    Vitals:   09/19/17 0633 09/19/17 0713 09/19/17 0935 09/19/17 1020  BP: (!) 94/53   117/72  Pulse: 70   70  Resp: 20     Temp: 98.1 F (36.7 C)     TempSrc: Axillary     SpO2: 95%  96%   Weight:  227 lb 12.8 oz (103.3 kg)    Height:        Intake/Output Summary (Last 24 hours) at 09/19/2017 1134 Last data filed at 09/19/2017 1007 Gross per 24 hour  Intake 900 ml  Output 650 ml  Net 250 ml   Filed Weights   09/17/17 0206 09/18/17 0519 09/19/17 0713  Weight: 225 lb 9.6 oz (102.3 kg) 225 lb 8 oz (102.3 kg) 227 lb 12.8 oz (103.3 kg)    Telemetry    Sinus rhythm in the 57s' with PVC's - Personally Reviewed  ECG    SR with diffuse Twave changes, similiar to previous EKGs - Personally Reviewed  Physical Exam   GEN: Obese female,  No acute distress.   Neck: No JVD Cardiac: RRR, no murmurs, rubs, or gallops.  Respiratory: Clear to auscultation bilaterally. GI: Soft, nontender, non-distended  MS: Puffiness of bil ankles, no pitting edema; No deformity. Neuro:  Nonfocal  Psych: Normal affect   Labs    Chemistry Recent Labs  Lab 09/16/17 2222 09/17/17 0529 09/18/17 0018 09/19/17 0346  NA  --  136 135  137  K  --  3.1* 4.0 4.4  CL  --  99* 101 104  CO2  --  _0 GLUCOSE  --  96 93 96  BUN  --  24* 21* 16  CREATININE  --  0.96 1.02* 0.88  CALCIUM  --  9.2 9.1 9.4  PROT 8.2*  --   --   --   ALBUMIN 3.9  --   --   --   AST 43*  --   --   --   ALT 21  --   --   --   ALKPHOS 148*  --   --   --   BILITOT 1.0  --   --   --   GFRNONAA  --  >60 59* >60  GFRAA  --  >60 >60 >60  ANIONGAP  --  _1 Hematology Recent Labs  Lab 09/16/17 0800  WBC 4.9  RBC 5.36*  HGB 13.6  HCT 44.4  MCV 82.8  MCH 25.4*  MCHC 30.6  RDW 17.3*  PLT 208    Cardiac Enzymes Recent Labs  Lab 09/17/17 1832 09/18/17 0018  TROPONINI <0.03 <0.03    Recent Labs  Lab 09/16/17 0819  TROPIPOC 0.00     BNP Recent Labs  Lab 09/16/17 0800  BNP 668.9*     DDimer No results for input(s): DDIMER in the last 168 hours.   Radiology    Ct Angio Chest/abd/pel For Dissection W And/or Wo Contrast  Result Date: 09/16/2017  IMPRESSION: Atherosclerotic changes without evidence of aneurysmal dilatation or dissection. Mild celiac axis stenosis. Left thyroid nodule stable from previous exams. The need for nonemergent workup can be determined on a clinical basis. Scattered benign-appearing pulmonary nodule stable from multiple previous exams. Scattered chronic changes within the abdomen. No acute abnormality is noted.   Cardiac Studies   Echocardiogram 05/31/2017 Study Conclusions - Left ventricle: The cavity size was normal. There was moderate focal basal hypertrophy of the septum. Systolic function was normal. The estimated ejection fraction was in the range of 50% to 55%. There is akinesis of the basalinferior myocardium. Features are consistent with a pseudonormal left ventricular filling pattern, with concomitant abnormal relaxation and increased filling pressure (grade 2 diastolic dysfunction). - Ventricular septum: The contour showed diastolic flattening. - Aortic valve:  Trileaflet; mildly thickened, mildly calcified leaflets. - Right ventricle: The cavity size was mildly dilated. Wall thickness was normal. - Right atrium: The atrium was moderately dilated. - Tricuspid valve: There was moderate regurgitation. - Pulmonary arteries: Systolic pressure was severely increased. PA peak pressure: 69 mm Hg (S). Impressions: - Compared to the prior study, there has been no significant interval change.  CARDIAC CATH: 06/02/2016 Diagnostic Diagram, pre-CABG       Patient Profile     61 y.o. female with a history of CAD s/p CABG 05/2016 w/ LIMA-LAD, SVG-OM-CFX, SVG-RCA, CHF, COPD on home oxygen, hyperlipidemia, hypertension, fibromyalgia, GERD, PAF formerly on amiodarone now on Eliquis, remote cocaine use and lupus who presented with chest pain since yesterday and also recent weight gain, DOE and bilateral lower extremity swelling. Mild CHF on CXR, negative troponin, BNP 668.9. Negative for PE.   Assessment & Plan    1. Acute on chronic diastolic CHF: EF 85-63% with grade 2 DD by echo 05/2017. She is now appears euvolemic, we restarted Torsemide 20 mg PO BID. Right heart tomorrow will assess her fluid status.  2. CAD: Hx of CABG X4 05/2016. On BB, ARB and statin. Not on aspirin due to need for anticoagulation. Troponin negative X3. We will arrange for a left and right cath on Monday. Held Eliquis, start heparin drip Start atorvastatin 80 mg po daily  If her cath shows normal volume status and no targets for intervention, we will discuss with pulmonary for therapy of pulmonary hypertension as she is severely SOB that has been progressively worsening limiting her ADLs.  They saw her yesterday and ordered ANA and RF.  3. PAF:  formerly on amio, CHADSVasc 6 (TIA 2, gender, hypertension, CAD, CHF) on Elliquis. Maintaining sinus rhythm on carvedilol. Hb 13.6  4. Severe pulmonary hypertension: Pt has known hx of COPD, on oxygen. PFT last year showed  severe diffusion capacity. Repeat PFTs showed Moderate Obstructive Airways Disease, Response to bronchodilator, Minimal Restriction -Parenchymal, Severe Diffusion Defect. The diffusion defect and reduced lung volumes suggest an early parenchymal process. Moderate airway obstruction is present. A clinical trial of bronchodilators may be beneficial in view of the airway obstruction. In view of the severity of the diffusion defect, studies with exercise would be helpful to evaluate the presence of hypoxemia. We will arrange for a pulmonary to see.  5. Hypokalemia: K+ 3.1  this am with diuresis. Replaced. Today 4.0.  She is at her baseline, we will discharge home.  Ena Dawley, MD 09/19/2017

## 2017-09-19 NOTE — Progress Notes (Signed)
ANTICOAGULATION CONSULT NOTE - Initial Consult  Pharmacy Consult for Heparin Indication: atrial fibrillation  No Known Allergies  Patient Measurements: Height: _0  (157.5 cm) Weight: 227 lb 12.8 oz (103.3 kg) IBW/kg (Calculated) : 50.1 Heparin Dosing Weight:  75 kg  Vital Signs: Temp: 98.1 F (36.7 C) (11/04 0633) Temp Source: Axillary (11/04 3846) BP: 117/72 (11/04 1020) Pulse Rate: 70 (11/04 1020)  Labs: Recent Labs    09/17/17 0529 09/17/17 1832 09/18/17 0018 09/19/17 0346  CREATININE 0.96  --  1.02* 0.88  TROPONINI  --  <0.03 <0.03  --     Estimated Creatinine Clearance: 77.6 mL/min (by C-G formula based on SCr of 0.88 mg/dL).   Medical History: Past Medical History:  Diagnosis Date  . CAD (coronary artery disease)    NSTEMI 08/2011:  LHC 08/21/11: mLAD 60-70%, pCFX occluded, dRCA chronic occlusion with L-R collats, EF 40-45%, inf AK.  PCI:  BMS to CFX.  Marland Kitchen CHF (congestive heart failure) ([place]) 05/31/2017  . COPD (chronic obstructive pulmonary disease) ([place]) 11/17/2016  . Depression   . Fibroids   . GERD (gastroesophageal reflux disease)   . Gout   . HLD (hyperlipidemia)    Chol = 235, LDL = 156 (08/2010)  . Hypertension   . Lupus 2009   ANA + 10/2008, repeat ANA + (05/2009), on that visit 05/2009 following labs obtained RF <20, CRP <0.4, ANA titers 1:80,   . On home oxygen therapy    "2L; 24/7 (05/31/2017)  . Pulmonary nodules 05/2008   noted on CXR and CT 05/2008, repeat CT 10/2008 - Stable small bilateral pulmonary nodules measuring up to 6 m m  . Stroke [facility]) 04/2014   "mini stroke"; denies residual on 05/31/2017    Assessment: Admitted with CP  Anticoag: Eliquis PTA, CHADSVasc 6. Change to IV heparin with plans for cardiac cath. CBC WNL on 11/1  Goal of Therapy:  Heparin level 0.3-0.7 units/ml  APTT 66-102 Monitor platelets by anticoagulation protocol: Yes   Plan:  D/C Eliquis- last dose 11/4 AM at 1018. Start IV heparin tonight when next dose  of Eliquis would be due. Heparin 4000 unit IV bolus, then infusion at 1050 units/hr APTT, HL, and CBC daily. Cath Monday   Georgeana Oertel S. Alford Highland, PharmD, BCPS Clinical Staff Pharmacist Pager [phone]  Eilene Ghazi Stillinger 09/19/2017,11:54 AM

## 2017-09-20 ENCOUNTER — Encounter (HOSPITAL_COMMUNITY)
Admission: EM | Disposition: A | Discharge status: Home / Self Care | Payer: Self-pay | Source: Home / Self Care | Attending: Cardiovascular Disease

## 2017-09-20 DIAGNOSIS — I25119 Atherosclerotic heart disease of native coronary artery with unspecified angina pectoris: Secondary | ICD-10-CM

## 2017-09-20 DIAGNOSIS — Z951 Presence of aortocoronary bypass graft: Secondary | ICD-10-CM

## 2017-09-20 DIAGNOSIS — I2729 Other secondary pulmonary hypertension: Secondary | ICD-10-CM

## 2017-09-20 DIAGNOSIS — I2721 Secondary pulmonary arterial hypertension: Secondary | ICD-10-CM

## 2017-09-20 HISTORY — PX: RIGHT/LEFT HEART CATH AND CORONARY/GRAFT ANGIOGRAPHY: CATH118267

## 2017-09-20 LAB — POCT I-STAT 3, VENOUS BLOOD GAS (G3P V)
ACID-BASE EXCESS: 2 mmol/L (ref 0.0–2.0)
Bicarbonate: 26.5 mmol/L (ref 20.0–28.0)
O2 SAT: 57 %
PCO2 VEN: 38.9 mmHg — AB (ref 44.0–60.0)
PH VEN: 7.442 — AB (ref 7.250–7.430)
PO2 VEN: 28 mmHg — AB (ref 32.0–45.0)
TCO2: 28 mmol/L (ref 22–32)

## 2017-09-20 LAB — POCT I-STAT 3, ART BLOOD GAS (G3+)
Acid-Base Excess: 2 mmol/L (ref 0.0–2.0)
Bicarbonate: 25.2 mmol/L (ref 20.0–28.0)
O2 SAT: 89 %
PCO2 ART: 34.3 mmHg (ref 32.0–48.0)
PH ART: 7.475 — AB (ref 7.350–7.450)
TCO2: 26 mmol/L (ref 22–32)
pO2, Arterial: 53 mmHg — ABNORMAL LOW (ref 83.0–108.0)

## 2017-09-20 LAB — RHEUMATOID FACTOR: RHEUMATOID FACTOR: 10.9 [IU]/mL (ref 0.0–13.9)

## 2017-09-20 SURGERY — RIGHT/LEFT HEART CATH AND CORONARY/GRAFT ANGIOGRAPHY
Anesthesia: LOCAL

## 2017-09-20 MED ORDER — LIDOCAINE HCL (PF) 1 % IJ SOLN
INTRAMUSCULAR | Status: DC | PRN
  Administered 2017-09-20: 28 mL

## 2017-09-20 MED ORDER — ENOXAPARIN SODIUM 100 MG/ML ~~LOC~~ SOLN
1.0000 mg/kg | Freq: Two times a day (BID) | SUBCUTANEOUS | Status: DC
  Filled 2017-09-20: qty 1

## 2017-09-20 MED ORDER — HEPARIN (PORCINE) IN NACL 2-0.9 UNIT/ML-% IJ SOLN
INTRAMUSCULAR | Status: AC
  Filled 2017-09-20: qty 500

## 2017-09-20 MED ORDER — FENTANYL CITRATE (PF) 100 MCG/2ML IJ SOLN
INTRAMUSCULAR | Status: DC | PRN
  Administered 2017-09-20: 25 ug via INTRAVENOUS

## 2017-09-20 MED ORDER — HYDRALAZINE HCL 20 MG/ML IJ SOLN
INTRAMUSCULAR | Status: DC | PRN
  Administered 2017-09-20: 10 mg via INTRAVENOUS

## 2017-09-20 MED ORDER — SODIUM CHLORIDE 0.9% FLUSH: 3.0000 mL | Freq: Two times a day (BID) | INTRAVENOUS | Status: DC

## 2017-09-20 MED ORDER — HYDRALAZINE HCL 20 MG/ML IJ SOLN
INTRAMUSCULAR | Status: AC
  Filled 2017-09-20: qty 1

## 2017-09-20 MED ORDER — IOPAMIDOL (ISOVUE-370) INJECTION 76%
INTRAVENOUS | Status: AC
  Filled 2017-09-20: qty 125

## 2017-09-20 MED ORDER — ASPIRIN 81 MG PO CHEW
81.0000 mg | CHEWABLE_TABLET | ORAL | Status: AC
  Administered 2017-09-20: 81 mg via ORAL
  Filled 2017-09-20: qty 1

## 2017-09-20 MED ORDER — IOPAMIDOL (ISOVUE-370) INJECTION 76%
INTRAVENOUS | Status: AC
  Filled 2017-09-20: qty 100

## 2017-09-20 MED ORDER — SODIUM CHLORIDE 0.9 % IV SOLN: 250.0000 mL | INTRAVENOUS | Status: DC | PRN

## 2017-09-20 MED ORDER — HEPARIN (PORCINE) IN NACL 2-0.9 UNIT/ML-% IJ SOLN
INTRAMUSCULAR | Status: AC
  Filled 2017-09-20: qty 1000

## 2017-09-20 MED ORDER — IOPAMIDOL (ISOVUE-370) INJECTION 76%
INTRAVENOUS | Status: DC | PRN
  Administered 2017-09-20: 180 mL via INTRA_ARTERIAL

## 2017-09-20 MED ORDER — CLOPIDOGREL BISULFATE 75 MG PO TABS
75.0000 mg | ORAL_TABLET | Freq: Every day | ORAL | Status: DC
  Filled 2017-09-20: qty 1

## 2017-09-20 MED ORDER — MIDAZOLAM HCL 2 MG/2ML IJ SOLN
INTRAMUSCULAR | Status: AC
  Filled 2017-09-20: qty 2

## 2017-09-20 MED ORDER — SODIUM CHLORIDE 0.9% FLUSH
3.0000 mL | INTRAVENOUS | Status: DC | PRN
Start: 2017-09-20 — End: 2017-09-20

## 2017-09-20 MED ORDER — FENTANYL CITRATE (PF) 100 MCG/2ML IJ SOLN
INTRAMUSCULAR | Status: AC
  Filled 2017-09-20: qty 2

## 2017-09-20 MED ORDER — HEPARIN (PORCINE) IN NACL 2-0.9 UNIT/ML-% IJ SOLN
INTRAMUSCULAR | Status: AC | PRN
  Administered 2017-09-20: 1500 mL

## 2017-09-20 MED ORDER — LIDOCAINE HCL (PF) 1 % IJ SOLN
INTRAMUSCULAR | Status: AC
  Filled 2017-09-20: qty 30

## 2017-09-20 MED ORDER — SODIUM CHLORIDE 0.9% FLUSH: 3.0000 mL | INTRAVENOUS | Status: DC | PRN

## 2017-09-20 MED ORDER — CLOPIDOGREL BISULFATE 300 MG PO TABS
300.0000 mg | ORAL_TABLET | Freq: Once | ORAL | Status: DC
  Filled 2017-09-20: qty 1

## 2017-09-20 MED ORDER — MIDAZOLAM HCL 2 MG/2ML IJ SOLN
INTRAMUSCULAR | Status: DC | PRN
  Administered 2017-09-20: 1 mg via INTRAVENOUS

## 2017-09-20 MED ORDER — SODIUM CHLORIDE 0.9 % IV SOLN
250.0000 mL | INTRAVENOUS | Status: DC | PRN
Start: 2017-09-20 — End: 2017-09-23

## 2017-09-20 MED ORDER — VERAPAMIL HCL 2.5 MG/ML IV SOLN
INTRAVENOUS | Status: AC
  Filled 2017-09-20: qty 2

## 2017-09-20 MED ORDER — SODIUM CHLORIDE 0.9 % IV SOLN: INTRAVENOUS | Status: DC

## 2017-09-20 MED ORDER — SODIUM CHLORIDE 0.9% FLUSH
3.0000 mL | Freq: Two times a day (BID) | INTRAVENOUS | Status: DC
  Administered 2017-09-21 (×3): 3 mL via INTRAVENOUS

## 2017-09-20 SURGICAL SUPPLY — 18 items
CATH INFINITI 5 FR AL2 (CATHETERS) ×1 IMPLANT
CATH INFINITI 5 FR IM (CATHETERS) ×1 IMPLANT
CATH INFINITI 5 FR JL6.0 (CATHETERS) ×1 IMPLANT
CATH INFINITI 5FR AL1 (CATHETERS) ×1 IMPLANT
CATH INFINITI 5FR MPB2 (CATHETERS) ×1 IMPLANT
CATH INFINITI 5FR MULTPACK ANG (CATHETERS) ×1 IMPLANT
CATH SWAN GANZ 7F STRAIGHT (CATHETERS) ×1 IMPLANT
GLIDEWIRE ANGLED SS 035X260CM (WIRE) ×1 IMPLANT
GUIDEWIRE ANGLED .035X150CM (WIRE) ×1 IMPLANT
KIT HEART LEFT (KITS) ×2 IMPLANT
PACK CARDIAC CATHETERIZATION (CUSTOM PROCEDURE TRAY) ×2 IMPLANT
SHEATH PINNACLE 5F 10CM (SHEATH) ×1 IMPLANT
SHEATH PINNACLE 7F 10CM (SHEATH) ×1 IMPLANT
TRANSDUCER W/STOPCOCK (MISCELLANEOUS) ×2 IMPLANT
TUBING CIL FLEX 10 FLL-RA (TUBING) ×2 IMPLANT
WIRE EMERALD 3MM-J .035X150CM (WIRE) ×1 IMPLANT
WIRE EMERALD 3MM-J .035X260CM (WIRE) ×1 IMPLANT
WIRE HI TORQ VERSACORE-J 145CM (WIRE) ×1 IMPLANT

## 2017-09-20 NOTE — Care Management Important Message (Signed)
Important Message  Patient Details  Name: Elizabeth [name] MRN: [id number] Date of Birth: [birth year]   Medicare Important Message Given:  Yes    Shireen Rayburn 09/20/2017, 1:23 PM

## 2017-09-20 NOTE — H&P (View-Only) (Signed)
Progress Note  Patient Name: Elizabeth [name] Date of Encounter: 09/20/2017  Primary Cardiologist: Dr. Sallyanne Kuster  Subjective   She feels slightly better today. Denies CP or SOB at rest.  Inpatient Medications    Scheduled Meds: . carvedilol  12.5 mg Oral BID WC  . colchicine  0.6 mg Oral BID  . fluticasone furoate-vilanterol  1 puff Inhalation Daily   And  . umeclidinium bromide  1 puff Inhalation Daily  . irbesartan  150 mg Oral Daily  . pantoprazole  40 mg Oral Daily  . potassium chloride  40 mEq Oral BID  . sodium chloride flush  3 mL Intravenous Q12H  . sodium chloride flush  3 mL Intravenous Q12H  . torsemide  20 mg Oral BID   Continuous Infusions: . sodium chloride    . sodium chloride    . sodium chloride    . heparin 1,050 Units/hr (09/20/17 0100)   PRN Meds: sodium chloride, sodium chloride, acetaminophen, albuterol, ALPRAZolam, guaiFENesin-dextromethorphan, nitroGLYCERIN, ondansetron (ZOFRAN) IV, sodium chloride flush, sodium chloride flush, zolpidem   Vital Signs    Vitals:   09/19/17 1755 09/19/17 2006 09/20/17 0600 09/20/17 0859  BP: (!) 145/72 131/78 123/76 116/78  Pulse: 75 77 71 71  Resp:  _0 Temp:  99.1 F (37.3 C) 98.3 F (36.8 C) 97.9 F (36.6 C)  TempSrc:  Oral Oral Oral  SpO2:  96% 96% 95%  Weight:   222 lb 9.6 oz (101 kg)   Height:        Intake/Output Summary (Last 24 hours) at 09/20/2017 0944 Last data filed at 09/20/2017 0700 Gross per 24 hour  Intake 710 ml  Output 2403 ml  Net -1693 ml   Filed Weights   09/18/17 0519 09/19/17 0713 09/20/17 0600  Weight: 225 lb 8 oz (102.3 kg) 227 lb 12.8 oz (103.3 kg) 222 lb 9.6 oz (101 kg)    Telemetry    Sinus rhythm in the 80s' with PVC's - Personally Reviewed  ECG    SR with diffuse Twave changes, similiar to previous EKGs - Personally Reviewed  Physical Exam   GEN: Obese female,  No acute distress.   Neck: No JVD Cardiac: RRR, no murmurs, rubs, or gallops.    Respiratory: Clear to auscultation bilaterally. GI: Soft, nontender, non-distended  MS: Puffiness of bil ankles, no pitting edema; No deformity. Neuro:  Nonfocal  Psych: Normal affect   Labs    Chemistry Recent Labs  Lab 09/16/17 2222 09/17/17 0529 09/18/17 0018 09/19/17 0346  NA  --  136 135 137  K  --  3.1* 4.0 4.4  CL  --  99* 101 104  CO2  --  _1 GLUCOSE  --  96 93 96  BUN  --  24* 21* 16  CREATININE  --  0.96 1.02* 0.88  CALCIUM  --  9.2 9.1 9.4  PROT 8.2*  --   --   --   ALBUMIN 3.9  --   --   --   AST 43*  --   --   --   ALT 21  --   --   --   ALKPHOS 148*  --   --   --   BILITOT 1.0  --   --   --   GFRNONAA  --  >60 59* >60  GFRAA  --  >60 >60 >60  ANIONGAP  --  _2 Hematology  Recent Labs  Lab 09/16/17 0800  WBC 4.9  RBC 5.36*  HGB 13.6  HCT 44.4  MCV 82.8  MCH 25.4*  MCHC 30.6  RDW 17.3*  PLT 208    Cardiac Enzymes Recent Labs  Lab 09/17/17 1832 09/18/17 0018  TROPONINI <0.03 <0.03    Recent Labs  Lab 09/16/17 0819  TROPIPOC 0.00     BNP Recent Labs  Lab 09/16/17 0800  BNP 668.9*     DDimer No results for input(s): DDIMER in the last 168 hours.   Radiology    Ct Angio Chest/abd/pel For Dissection W And/or Wo Contrast  Result Date: 09/16/2017  IMPRESSION: Atherosclerotic changes without evidence of aneurysmal dilatation or dissection. Mild celiac axis stenosis. Left thyroid nodule stable from previous exams. The need for nonemergent workup can be determined on a clinical basis. Scattered benign-appearing pulmonary nodule stable from multiple previous exams. Scattered chronic changes within the abdomen. No acute abnormality is noted.   Cardiac Studies   Echocardiogram 05/31/2017 Study Conclusions - Left ventricle: The cavity size was normal. There was moderate focal basal hypertrophy of the septum. Systolic function was normal. The estimated ejection fraction was in the range of 50% to 55%. There is  akinesis of the basalinferior myocardium. Features are consistent with a pseudonormal left ventricular filling pattern, with concomitant abnormal relaxation and increased filling pressure (grade 2 diastolic dysfunction). - Ventricular septum: The contour showed diastolic flattening. - Aortic valve: Trileaflet; mildly thickened, mildly calcified leaflets. - Right ventricle: The cavity size was mildly dilated. Wall thickness was normal. - Right atrium: The atrium was moderately dilated. - Tricuspid valve: There was moderate regurgitation. - Pulmonary arteries: Systolic pressure was severely increased. PA peak pressure: 69 mm Hg (S). Impressions: - Compared to the prior study, there has been no significant interval change.  CARDIAC CATH: 06/02/2016 Diagnostic Diagram, pre-CABG       Patient Profile     61 y.o. female with a history of CAD s/p CABG 05/2016 w/ LIMA-LAD, SVG-OM-CFX, SVG-RCA, CHF, COPD on home oxygen, hyperlipidemia, hypertension, fibromyalgia, GERD, PAF formerly on amiodarone now on Eliquis, remote cocaine use and lupus who presented with chest pain since yesterday and also recent weight gain, DOE and bilateral lower extremity swelling. Mild CHF on CXR, negative troponin, BNP 668.9. Negative for PE.   Assessment & Plan    1. Acute on chronic diastolic CHF: EF 60-45% with grade 2 DD by echo 05/2017. She is now appears euvolemic, we restarted Torsemide 20 mg PO BID with good response and negative 1.7 L overnight. Right heart today if they are able to get access.   2. CAD: Hx of CABG X4 05/2016. On BB, ARB and statin. Not on aspirin due to need for anticoagulation. Troponin negative X3. We will arrange for a left and right cath on Monday. Held Eliquis, start heparin drip Start atorvastatin 80 mg po daily  If her cath shows normal volume status and no targets for intervention, we will discuss with pulmonary for therapy of pulmonary hypertension as she is  severely SOB that has been progressively worsening limiting her ADLs.  They saw her yesterday and ordered ANA and RF.  3. PAF:  formerly on amio, CHADSVasc 6 (TIA 2, gender, hypertension, CAD, CHF) on Elliquis. Maintaining sinus rhythm on carvedilol. Hb 13.6  4. Severe pulmonary hypertension: Pt has known hx of COPD, on oxygen. PFT last year showed severe diffusion capacity. Repeat PFTs showed Moderate Obstructive Airways Disease, Response to bronchodilator, Minimal Restriction -Parenchymal, Severe  Diffusion Defect. The diffusion defect and reduced lung volumes suggest an early parenchymal process. Moderate airway obstruction is present. A clinical trial of bronchodilators may be beneficial in view of the airway obstruction. In view of the severity of the diffusion defect, studies with exercise would be helpful to evaluate the presence of hypoxemia. We will arrange for a pulmonary to see.  5. Hypokalemia: resolved   Ena Dawley, MD 09/20/2017

## 2017-09-20 NOTE — Interval H&P Note (Signed)
History and Physical Interval Note:  09/20/2017 6:00 PM  Elizabeth [name]  has presented today for surgery, with the diagnosis of unstable agina - known CAD-CABG, Pulmonary HTN, Acute on Chronic Diastolic HF.   The various methods of treatment have been discussed with the patient and family. After consideration of risks, benefits and other options for treatment, the patient has consented to  Procedure(s): RIGHT/LEFT HEART CATH AND CORONARY/GRAFT ANGIOGRAPHY (N/A) with possible PERCUTANEOUS CORONARY INTERVENTION as a surgical intervention .  The patient's history has been reviewed, patient examined, no change in status, stable for surgery.  I have reviewed the patient's chart and labs.  Questions were answered to the patient's satisfaction.    Cath Lab Visit (complete for each Cath Lab visit)  Clinical Evaluation Leading to the Procedure:   ACS: Yes.  -With heart failure being an anginal equivalent  Non-ACS:    Anginal Classification: CCS III  Anti-ischemic medical therapy: Minimal Therapy (1 class of medications)  Non-Invasive Test Results: No non-invasive testing performed  Prior CABG: Previous CABG  [name]

## 2017-09-20 NOTE — Telephone Encounter (Signed)
REFILL 

## 2017-09-20 NOTE — Consult Note (Signed)
PULMONARY / CRITICAL CARE MEDICINE   Name: Elizabeth [name] MRN: [id number] DOB: 26-Jul-1956    ADMISSION DATE:  09/16/2017 CONSULTATION DATE: 09/18/2017    CHIEF COMPLAINT:  Dyspnea  HISTORY OF PRESENT ILLNESS:   This is a 61 year old with a history of coronary artery disease who is also on home O2 for chronic hypoxia.  She presented complaining of increasing dyspnea and increasing oxygen requirements at home.  She has also been having midline chest pain, which she says will come on at times without provocation.  There is no radiation into the neck or arms.  There is no associated nausea or vomiting.  She has a history of CHF, and has been treated with diuresis but her dyspnea persists.  A CTA was performed on 11/1 and showed scattered small nodules which are stable, some minimal diffuse emphysematous changes, and no evidence of pulmonary embolism.  Should be noted that the patient is chronically anticoagulated.  He does have a history of a positive ANA, titer of 1-80 but the only complaint she has of joint pain is in her right great toe.  She has no skin changes or dry eyes.  Her most recent PFTs were performed on 06/04/2016 showed an FEV1 to FEC ratio of 72%.  DLCO was reduced to 46% of predicted.  Vital capacity was 78% of predicted.  Her most recent echocardiogram performed on 7/20 showed an ejection fraction of 50-55% and an estimated PA pressure 69. Mild OSA by PSG 2013   SUBJECTIVE:  Patient complains of continued dyspnea.  She is unable to walk back and forth to the bathroom even with oxygen in place. Remains on Brio and Incruse (substituted for her home Trelegy) She is now net -1.5 L for the hospitalization  VITAL SIGNS: BP 116/78 (BP Location: Right Arm)   Pulse 71   Temp 97.9 F (36.6 C) (Oral)   Resp 18   Ht _0  (1.575 m)   Wt 101 kg (222 lb 9.6 oz)   LMP 12/30/1994   SpO2 95%   BMI 40.71 kg/m     INTAKE / OUTPUT: I/O last 3 completed shifts: In: 1010 [P.O.:1000;  I.V.:10] Out: 2703 [Urine:2700; Stool:3]  PHYSICAL EXAMINATION: General: This is an obese elderly female who is obviously dyspneic with minimal exertion Neuro: Appropriate Cardiovascular: S1 and S2 are regular without murmur rub or gallop.  The currently is no dependent edema.  She has no JVD. Lungs: But she becomes tachypneic with very minimal exertion.  There is only fair air movement throughout, I to E ratio is normal.  There are no wheezes. Abdomen: Obese and soft without any obvious organomegaly masses or tenderness   LABS:  BMET Recent Labs  Lab 09/17/17 0529 09/18/17 0018 09/19/17 0346  NA 136 135 137  K 3.1* 4.0 4.4  CL 99* 101 104  CO2 _1 BUN 24* 21* 16  CREATININE 0.96 1.02* 0.88  GLUCOSE 96 93 96    Electrolytes Recent Labs  Lab 09/17/17 0529 09/18/17 0018 09/19/17 0346  CALCIUM 9.2 9.1 9.4    CBC Recent Labs  Lab 09/16/17 0800  WBC 4.9  HGB 13.6  HCT 44.4  PLT 208    Coag's No results for input(s): APTT, INR in the last 168 hours.  Sepsis Markers No results for input(s): LATICACIDVEN, PROCALCITON, O2SATVEN in the last 168 hours.  ABG No results for input(s): PHART, PCO2ART, PO2ART in the last 168 hours.  Liver Enzymes Recent Labs  Lab 09/16/17  2222  AST 43*  ALT 21  ALKPHOS 148*  BILITOT 1.0  ALBUMIN 3.9    Cardiac Enzymes Recent Labs  Lab 09/17/17 1832 09/18/17 0018  TROPONINI <0.03 <0.03    Glucose No results for input(s): GLUCAP in the last 168 hours.  Imaging No results found.   DISCUSSION: 61 year old woman with progressive hypoxemic respiratory failure and multifactorial secondary pulmonary hypertension (PASP ~ 69 mm Hg 05/2017).  She has coronary disease with a history of CABG 2017, obesity with suspected obstructive sleep apnea.  PSG showed mild OSA 2013 but she has gained 50 pounds in the interim.  Repeat PSG has not yet been done.  Hypertension with atrial fibrillation, diastolic and systolic CHF.  She also  has a mildly positive ANA of unclear significance.  She is followed by Dr. Wonda Amis in our office and has been on Trelegy, supplemental oxygen at 2 - 3 L/min.  Admitted with progressive acute on chronic dyspnea.  She has been diuresed, now 1.5 L negative for the hospitalization.  Continued on bronchodilators, O2.  No real clinical improvement.  Patient is planning to go for right and left heart catheterization on 11/5, agree with this plan.  Agree with continued diuresis as she can tolerate.  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  Contribution of connective tissue disease unclear but possible.  Repeat ANA and RF are pending.  If cardiac status is optimized and this is all who group 3 pulmonary hypertension then addition of directed pulmonary vasodilators is likely not indicated.  If there is evidence for connective tissue disease, primary left-sided heart disease resulting in right-sided failure than we could consider.  Await catheterization results.  In any event she needs a sleep study and probably treatment of her obstructive sleep apnea.  I would not start her on any vasodilator therapy until this has been done. Defer to outpt setting.    Baltazar Apo, MD, PhD 09/20/2017, 9:48 AM [facility] [phone] or if no answer [phone]

## 2017-09-20 NOTE — Progress Notes (Signed)
 Progress Note  Patient Name: Elizabeth [name] Date of Encounter: 09/20/2017  Primary Cardiologist: Dr. Croitoru  Subjective   She feels slightly better today. Denies CP or SOB at rest.  Inpatient Medications    Scheduled Meds: . carvedilol  12.5 mg Oral BID WC  . colchicine  0.6 mg Oral BID  . fluticasone furoate-vilanterol  1 puff Inhalation Daily   And  . umeclidinium bromide  1 puff Inhalation Daily  . irbesartan  150 mg Oral Daily  . pantoprazole  40 mg Oral Daily  . potassium chloride  40 mEq Oral BID  . sodium chloride flush  3 mL Intravenous Q12H  . sodium chloride flush  3 mL Intravenous Q12H  . torsemide  20 mg Oral BID   Continuous Infusions: . sodium chloride    . sodium chloride    . sodium chloride    . heparin 1,050 Units/hr (09/20/17 0100)   PRN Meds: sodium chloride, sodium chloride, acetaminophen, albuterol, ALPRAZolam, guaiFENesin-dextromethorphan, nitroGLYCERIN, ondansetron (ZOFRAN) IV, sodium chloride flush, sodium chloride flush, zolpidem   Vital Signs    Vitals:   09/19/17 1755 09/19/17 2006 09/20/17 0600 09/20/17 0859  BP: (!) 145/72 131/78 123/76 116/78  Pulse: 75 77 71 71  Resp:  18 20 18  Temp:  99.1 F (37.3 C) 98.3 F (36.8 C) 97.9 F (36.6 C)  TempSrc:  Oral Oral Oral  SpO2:  96% 96% 95%  Weight:   222 lb 9.6 oz (101 kg)   Height:        Intake/Output Summary (Last 24 hours) at 09/20/2017 0944 Last data filed at 09/20/2017 0700 Gross per 24 hour  Intake 710 ml  Output 2403 ml  Net -1693 ml   Filed Weights   09/18/17 0519 09/19/17 0713 09/20/17 0600  Weight: 225 lb 8 oz (102.3 kg) 227 lb 12.8 oz (103.3 kg) 222 lb 9.6 oz (101 kg)    Telemetry    Sinus rhythm in the 80s' with PVC's - Personally Reviewed  ECG    SR with diffuse Twave changes, similiar to previous EKGs - Personally Reviewed  Physical Exam   GEN: Obese female,  No acute distress.   Neck: No JVD Cardiac: RRR, no murmurs, rubs, or gallops.    Respiratory: Clear to auscultation bilaterally. GI: Soft, nontender, non-distended  MS: Puffiness of bil ankles, no pitting edema; No deformity. Neuro:  Nonfocal  Psych: Normal affect   Labs    Chemistry Recent Labs  Lab 09/16/17 2222 09/17/17 0529 09/18/17 0018 09/19/17 0346  NA  --  136 135 137  K  --  3.1* 4.0 4.4  CL  --  99* 101 104  CO2  --  22 22 22  GLUCOSE  --  96 93 96  BUN  --  24* 21* 16  CREATININE  --  0.96 1.02* 0.88  CALCIUM  --  9.2 9.1 9.4  PROT 8.2*  --   --   --   ALBUMIN 3.9  --   --   --   AST 43*  --   --   --   ALT 21  --   --   --   ALKPHOS 148*  --   --   --   BILITOT 1.0  --   --   --   GFRNONAA  --  >60 59* >60  GFRAA  --  >60 >60 >60  ANIONGAP  --  15 12 11     Hematology   Recent Labs  Lab 09/16/17 0800  WBC 4.9  RBC 5.36*  HGB 13.6  HCT 44.4  MCV 82.8  MCH 25.4*  MCHC 30.6  RDW 17.3*  PLT 208    Cardiac Enzymes Recent Labs  Lab 09/17/17 1832 09/18/17 0018  TROPONINI <0.03 <0.03    Recent Labs  Lab 09/16/17 0819  TROPIPOC 0.00     BNP Recent Labs  Lab 09/16/17 0800  BNP 668.9*     DDimer No results for input(s): DDIMER in the last 168 hours.   Radiology    Ct Angio Chest/abd/pel For Dissection W And/or Wo Contrast  Result Date: 09/16/2017  IMPRESSION: Atherosclerotic changes without evidence of aneurysmal dilatation or dissection. Mild celiac axis stenosis. Left thyroid nodule stable from previous exams. The need for nonemergent workup can be determined on a clinical basis. Scattered benign-appearing pulmonary nodule stable from multiple previous exams. Scattered chronic changes within the abdomen. No acute abnormality is noted.   Cardiac Studies   Echocardiogram 05/31/2017 Study Conclusions - Left ventricle: The cavity size was normal. There was moderate focal basal hypertrophy of the septum. Systolic function was normal. The estimated ejection fraction was in the range of 50% to 55%. There is  akinesis of the basalinferior myocardium. Features are consistent with a pseudonormal left ventricular filling pattern, with concomitant abnormal relaxation and increased filling pressure (grade 2 diastolic dysfunction). - Ventricular septum: The contour showed diastolic flattening. - Aortic valve: Trileaflet; mildly thickened, mildly calcified leaflets. - Right ventricle: The cavity size was mildly dilated. Wall thickness was normal. - Right atrium: The atrium was moderately dilated. - Tricuspid valve: There was moderate regurgitation. - Pulmonary arteries: Systolic pressure was severely increased. PA peak pressure: 69 mm Hg (S). Impressions: - Compared to the prior study, there has been no significant interval change.  CARDIAC CATH: 06/02/2016 Diagnostic Diagram, pre-CABG       Patient Profile     60 y.o. female with a history of CAD s/p CABG 05/2016 w/ LIMA-LAD, SVG-OM-CFX, SVG-RCA, CHF, COPD on home oxygen, hyperlipidemia, hypertension, fibromyalgia, GERD, PAF formerly on amiodarone now on Eliquis, remote cocaine use and lupus who presented with chest pain since yesterday and also recent weight gain, DOE and bilateral lower extremity swelling. Mild CHF on CXR, negative troponin, BNP 668.9. Negative for PE.   Assessment & Plan    1. Acute on chronic diastolic CHF: EF 50-55% with grade 2 DD by echo 05/2017. She is now appears euvolemic, we restarted Torsemide 20 mg PO BID with good response and negative 1.7 L overnight. Right heart today if they are able to get access.   2. CAD: Hx of CABG X4 05/2016. On BB, ARB and statin. Not on aspirin due to need for anticoagulation. Troponin negative X3. We will arrange for a left and right cath on Monday. Held Eliquis, start heparin drip Start atorvastatin 80 mg po daily  If her cath shows normal volume status and no targets for intervention, we will discuss with pulmonary for therapy of pulmonary hypertension as she is  severely SOB that has been progressively worsening limiting her ADLs.  They saw her yesterday and ordered ANA and RF.  3. PAF:  formerly on amio, CHADSVasc 6 (TIA 2, gender, hypertension, CAD, CHF) on Elliquis. Maintaining sinus rhythm on carvedilol. Hb 13.6  4. Severe pulmonary hypertension: Pt has known hx of COPD, on oxygen. PFT last year showed severe diffusion capacity. Repeat PFTs showed Moderate Obstructive Airways Disease, Response to bronchodilator, Minimal Restriction -Parenchymal, Severe   Diffusion Defect. The diffusion defect and reduced lung volumes suggest an early parenchymal process. Moderate airway obstruction is present. A clinical trial of bronchodilators may be beneficial in view of the airway obstruction. In view of the severity of the diffusion defect, studies with exercise would be helpful to evaluate the presence of hypoxemia. We will arrange for a pulmonary to see.  5. Hypokalemia: resolved   Mihail Prettyman, MD 09/20/2017  

## 2017-09-20 NOTE — Progress Notes (Signed)
ANTICOAGULATION CONSULT NOTE - [facility] [place] for enoxaparin Indication: atrial fibrillation  No Known Allergies  Patient Measurements: Height: _0  (157.5 cm) Weight: 222 lb 9.6 oz (101 kg) IBW/kg (Calculated) : 50.1 Heparin Dosing Weight:  75 kg  Vital Signs: Temp: 98 F (36.7 C) (11/05 2035) Temp Source: Oral (11/05 2035) BP: 131/85 (11/05 2035) Pulse Rate: 81 (11/05 2035)  Labs: Recent Labs    09/18/17 0018 09/19/17 0346  CREATININE 1.02* 0.88  TROPONINI <0.03  --     Estimated Creatinine Clearance: 76.6 mL/min (by C-G formula based on SCr of 0.88 mg/dL).   Medical History: Past Medical History:  Diagnosis Date  . CAD (coronary artery disease)    NSTEMI 08/2011:  LHC 08/21/11: mLAD 60-70%, pCFX occluded, dRCA chronic occlusion with L-R collats, EF 40-45%, inf AK.  PCI:  BMS to CFX.  Marland Kitchen CHF (congestive heart failure) ([place]) 05/31/2017  . COPD (chronic obstructive pulmonary disease) ([place]) 11/17/2016  . Depression   . Fibroids   . GERD (gastroesophageal reflux disease)   . Gout   . HLD (hyperlipidemia)    Chol = 235, LDL = 156 (08/2010)  . Hypertension   . Lupus 2009   ANA + 10/2008, repeat ANA + (05/2009), on that visit 05/2009 following labs obtained RF <20, CRP <0.4, ANA titers 1:80,   . On home oxygen therapy    "2L; 24/7 (05/31/2017)  . Pulmonary nodules 05/2008   noted on CXR and CT 05/2008, repeat CT 10/2008 - Stable small bilateral pulmonary nodules measuring up to 6 m m  . Stroke ([place]) 04/2014   "mini stroke"; denies residual on 05/31/2017    Assessment:  72 yof with history of CAD and atrial fibrillation was found to have 2 culprit lesions (graft lesion and SVG-OM-dCx) as well as the anastomotic lesion and OM 3, leading to recommendation for a possible staged PCI this week. Patient was on apixaban prior to admission (last dose on 11/4 in AM) and transitioned to heparin prior to cath. Patient had refused heparin level draws.    Pharmacy consulted to dose anticoagulation 8 hours after sheath removal (11/5_1 ). Cardiology okay with using enoxaparin. CBC had been within normal range. Renal function has been stable (SCr 0.88, CrCL ~75 mL/min).   Goal of Therapy:  Monitor platelets by anticoagulation protocol: Yes   Plan:  Will start enoxaparin 1 mg/kg every 12 hours on 11/6_2  (8 hrs from sheath removal) Will monitor renal function and CBC Will follow up with plan for possible staged PCI  Doylene Canard, PharmD Clinical Pharmacist  Pager: [phone] Phone: [phone] 09/20/2017,9:08 PM

## 2017-09-20 NOTE — Evaluation (Signed)
Physical Therapy Evaluation Patient Details Name: Elizabeth [name] MRN: [id number] DOB: 07/15/1956 Today's Date: 09/20/2017   History of Present Illness  Pt is a 61 y.o.femalewith a history of CAD s/p CABG 05/2016 w/ LIMA-LAD, SVG-OM-CFX, SVG-RCA, CHF, COPD on home oxygen, hyperlipidemia, hypertension, fibromyalgia, GERD, PAF formerly on amiodarone now on Eliquis, remote cocaine useand lupus who presented with chest pain since yesterday and also recent weight gain, DOE and bilateral lower extremity swelling. Mild CHF on CXR, negative troponin, BNP 668.9. Negative for PE.   Clinical Impression  Pt admitted with above diagnosis. Pt currently with functional limitations due to the deficits listed below (see PT Problem List). At the time of PT eval pt was limited by DOE and low O2 sats. Pt was initially on 3.5L/min supplemental O2. At rest sats at 85%. Increased O2 to 6L/min for ambulation, and sats decreased to 77% by end of gait training. Pt was returned to 3.5L/min supplemental O2 at end of session and sats at 93% at rest. Feel that home health PT would be a good option to assist with safely increasing tolerance for functional activity, and possibly assisting with progression to Cardiac/Pulmonary Rehab. Pt was educated on use of RW and shower chair for energy conservation, however pt appears resistant. Pt will benefit from skilled PT to increase their independence and safety with mobility to allow discharge to the venue listed below.       Follow Up Recommendations Home health PT;Supervision for mobility/OOB    Equipment Recommendations  None recommended by PT    Recommendations for Other Services       Precautions / Restrictions Precautions Precautions: Fall Precaution Comments: Watch sats  Restrictions Weight Bearing Restrictions: No      Mobility  Bed Mobility               General bed mobility comments: Pt sitting up on EOB when PT arrived.   Transfers Overall  transfer level: Needs assistance Equipment used: Rolling walker (2 wheeled) Transfers: Sit to/from Stand Sit to Stand: Supervision         General transfer comment: Supervision for safety. No assist required with RW.   Ambulation/Gait Ambulation/Gait assistance: Min guard;Supervision Ambulation Distance (Feet): 200 Feet Assistive device: Rolling walker (2 wheeled) Gait Pattern/deviations: Step-through pattern;Decreased stride length;Trunk flexed Gait velocity: Decreased Gait velocity interpretation: Below normal speed for age/gender General Gait Details: VC's for pursed-lip breathing and general safety with RW. Close guard initially progressing to supervision for safety.  Sats dropped to 77% on 6L/min supplemental O2.   Stairs            Wheelchair Mobility    Modified Rankin (Stroke Patients Only)       Balance Overall balance assessment: Needs assistance Sitting-balance support: Feet supported;No upper extremity supported Sitting balance-Leahy Scale: Good     Standing balance support: No upper extremity supported;During functional activity Standing balance-Leahy Scale: Fair                               Pertinent Vitals/Pain      Home Living Family/patient expects to be discharged to:: Private residence Living Arrangements: Other relatives Available Help at Discharge: Family;Available 24 hours/day Type of Home: House Home Access: Stairs to enter Entrance Stairs-Rails: None Entrance Stairs-Number of Steps: 1 Home Layout: Two level;Able to live on main level with bedroom/bathroom Home Equipment: Shower seat;Grab bars - tub/shower;Walker - 2 wheels;Cane - single point  Prior Function Level of Independence: Independent         Comments: on 2L of oxygen at baseline      Hand Dominance   Dominant Hand: Right    Extremity/Trunk Assessment   Upper Extremity Assessment Upper Extremity Assessment: Defer to OT evaluation    Lower  Extremity Assessment Lower Extremity Assessment: Generalized weakness    Cervical / Trunk Assessment Cervical / Trunk Assessment: Normal  Communication   Communication: No difficulties  Cognition Arousal/Alertness: Awake/alert Behavior During Therapy: WFL for tasks assessed/performed Overall Cognitive Status: Within Functional Limits for tasks assessed                                        General Comments      Exercises     Assessment/Plan    PT Assessment Patient needs continued PT services  PT Problem List Decreased strength;Decreased activity tolerance;Decreased range of motion;Decreased balance;Decreased mobility;Decreased knowledge of use of DME;Decreased safety awareness;Decreased knowledge of precautions;Cardiopulmonary status limiting activity       PT Treatment Interventions DME instruction;Gait training;Stair training;Functional mobility training;Therapeutic activities;Therapeutic exercise;Neuromuscular re-education;Patient/family education    PT Goals (Current goals can be found in the Care Plan section)  Acute Rehab PT Goals Patient Stated Goal: Improve breathing PT Goal Formulation: With patient Time For Goal Achievement: 10/04/17 Potential to Achieve Goals: Good    Frequency Min 3X/week   Barriers to discharge        Co-evaluation               AM-PAC PT "6 Clicks" Daily Activity  Outcome Measure Difficulty turning over in bed (including adjusting bedclothes, sheets and blankets)?: None Difficulty moving from lying on back to sitting on the side of the bed? : None Difficulty sitting down on and standing up from a chair with arms (e.g., wheelchair, bedside commode, etc,.)?: None Help needed moving to and from a bed to chair (including a wheelchair)?: None Help needed walking in hospital room?: A Little Help needed climbing 3-5 steps with a railing? : A Little 6 Click Score: 22    End of Session Equipment Utilized During  Treatment: Gait belt;Oxygen Activity Tolerance: Treatment limited secondary to medical complications (Comment)(Decreased O2 and DOE) Patient left: in bed;with call bell/phone within reach(Sitting on EOB without assistance. ) Nurse Communication: Mobility status PT Visit Diagnosis: Unsteadiness on feet (R26.81);Difficulty in walking, not elsewhere classified (R26.2)    Time: 3846-6599 PT Time Calculation (min) (ACUTE ONLY): 27 min   Charges:   PT Evaluation $PT Eval Moderate Complexity: 1 Mod PT Treatments $Gait Training: 8-22 mins   PT G Codes:        Rolinda Roan, PT, DPT Acute Rehabilitation Services Pager: [phone]   Thelma Comp 09/20/2017, 1:15 PM

## 2017-09-21 ENCOUNTER — Encounter (HOSPITAL_COMMUNITY): Payer: Self-pay | Admitting: Cardiology

## 2017-09-21 ENCOUNTER — Other Ambulatory Visit: Payer: Self-pay

## 2017-09-21 LAB — CBC
HCT: 40.1 % (ref 36.0–46.0)
HEMOGLOBIN: 11.9 g/dL — AB (ref 12.0–15.0)
MCH: 25 pg — AB (ref 26.0–34.0)
MCHC: 29.7 g/dL — AB (ref 30.0–36.0)
MCV: 84.2 fL (ref 78.0–100.0)
Platelets: 239 10*3/uL (ref 150–400)
RBC: 4.76 MIL/uL (ref 3.87–5.11)
RDW: 18.5 % — ABNORMAL HIGH (ref 11.5–15.5)
WBC: 6.7 10*3/uL (ref 4.0–10.5)

## 2017-09-21 LAB — POCT ACTIVATED CLOTTING TIME: Activated Clotting Time: 120 seconds

## 2017-09-21 LAB — FANA STAINING PATTERNS: CENTROMERE AB: 1:1280 {titer}

## 2017-09-21 LAB — ANTINUCLEAR ANTIBODIES, IFA: ANTINUCLEAR ANTIBODIES, IFA: POSITIVE — AB

## 2017-09-21 MED ORDER — ENOXAPARIN SODIUM 100 MG/ML ~~LOC~~ SOLN
1.0000 mg/kg | Freq: Two times a day (BID) | SUBCUTANEOUS | Status: DC
  Administered 2017-09-21 (×2): 95 mg via SUBCUTANEOUS
  Filled 2017-09-21: qty 1
  Filled 2017-09-21: qty 0.95

## 2017-09-21 MED ORDER — FUROSEMIDE 10 MG/ML IJ SOLN
40.0000 mg | Freq: Two times a day (BID) | INTRAMUSCULAR | Status: DC
  Administered 2017-09-21 – 2017-09-22 (×3): 40 mg via INTRAVENOUS
  Filled 2017-09-21 (×3): qty 4

## 2017-09-21 MED ORDER — SODIUM CHLORIDE 0.9 % IV SOLN
INTRAVENOUS | Status: DC
  Administered 2017-09-22: 06:00:00 via INTRAVENOUS

## 2017-09-21 MED ORDER — ASPIRIN 81 MG PO CHEW
81.0000 mg | CHEWABLE_TABLET | ORAL | Status: AC
  Administered 2017-09-22: 81 mg via ORAL
  Filled 2017-09-21: qty 1

## 2017-09-21 MED ORDER — SODIUM CHLORIDE 0.9% FLUSH
3.0000 mL | Freq: Two times a day (BID) | INTRAVENOUS | Status: DC
  Administered 2017-09-21 – 2017-09-22 (×2): 3 mL via INTRAVENOUS

## 2017-09-21 MED ORDER — CLOPIDOGREL BISULFATE 75 MG PO TABS
75.0000 mg | ORAL_TABLET | Freq: Every day | ORAL | Status: DC
  Administered 2017-09-22 – 2017-09-23 (×2): 75 mg via ORAL
  Filled 2017-09-21 (×2): qty 1

## 2017-09-21 MED ORDER — SODIUM CHLORIDE 0.9 % IV SOLN
250.0000 mL | INTRAVENOUS | Status: DC | PRN
Start: 2017-09-21 — End: 2017-09-22

## 2017-09-21 MED ORDER — CLOPIDOGREL BISULFATE 75 MG PO TABS
300.0000 mg | ORAL_TABLET | Freq: Once | ORAL | Status: AC
Start: 2017-09-21 — End: 2017-09-21
  Administered 2017-09-21: 300 mg via ORAL
  Filled 2017-09-21: qty 4

## 2017-09-21 MED ORDER — SODIUM CHLORIDE 0.9% FLUSH: 3.0000 mL | INTRAVENOUS | Status: DC | PRN

## 2017-09-21 NOTE — Progress Notes (Signed)
Patient arrived back to the unit from the Cath lab without any distress noted. Placed on frequent vital signs monitoring. Right femoral groin is level 0 without no signs of bleeding. Will continue to monitor for bleeding and complication.

## 2017-09-21 NOTE — Progress Notes (Signed)
Physical Therapy Treatment Patient Details Name: Elizabeth [name] MRN: [id number] DOB: November 04, 1956 Today's Date: 09/21/2017    History of Present Illness Pt is a 61 y.o.femalewith a history of CAD s/p CABG 05/2016 w/ LIMA-LAD, SVG-OM-CFX, SVG-RCA, CHF, COPD on home oxygen, hyperlipidemia, hypertension, fibromyalgia, GERD, PAF formerly on amiodarone now on Eliquis, remote cocaine useand lupus who presented with chest pain since yesterday and also recent weight gain, DOE and bilateral lower extremity swelling. Mild CHF on CXR, negative troponin, BNP 668.9. Negative for PE.     PT Comments    O2 sats measured during session. Pt O2 sats 80-82% on 4 liters during ambulation. Increased O2 to 6 liters pt O2 sats increased 82-90% while ambulating. Pt required 3 standing rest breaks. Cues for pacing and pursed lip breathing. Supervision to min guard with gait for safety. Ambulated to restroom without walker supervision for gait. At end of session pt reported cramping on R side of abdomen after bending. Pt holding side and bending over due to pain rated 7/10. Informed nurse. Cardiologist entered room at end of session. Pt will benefit from use of rollator during gait next session for seated rest breaks.   Follow Up Recommendations  Home health PT;Supervision for mobility/OOB     Equipment Recommendations  None recommended by PT    Recommendations for Other Services       Precautions / Restrictions Precautions Precautions: Fall Precaution Comments: Watch sats  Restrictions Weight Bearing Restrictions: No    Mobility  Bed Mobility Overal bed mobility: Modified Independent Bed Mobility: Supine to Sit     Supine to sit: Modified independent (Device/Increase time)        Transfers Overall transfer level: Needs assistance Equipment used: Rolling walker (2 wheeled) Transfers: Sit to/from Stand Sit to Stand: Supervision         General transfer comment: Supervision for safety. No  assist required with RW.  VC for proper hand placement for safety.  Ambulation/Gait Ambulation/Gait assistance: Supervision;Min guard Ambulation Distance (Feet): 430 Feet(13f from recliner to restroom [id number]) Assistive device: Rolling walker (2 wheeled) Gait Pattern/deviations: Step-through pattern;Decreased stride length;Trunk flexed Gait velocity: Decreased   General Gait Details: VC for slower pace when ambulating; vc pursed lip breathing. Pt fatigued required 3 restbreaks with RW. O2 sats between 80%-82% on 4 Liters, increased to 6 Liters O2 sats 82%-90%,  Pt able to get up and walk to restroom without RW 10 ft supervision for safety. No assist needed in restroom   Stairs            Wheelchair Mobility    Modified Rankin (Stroke Patients Only)       Balance Overall balance assessment: Needs assistance Sitting-balance support: Feet supported;No upper extremity supported Sitting balance-Leahy Scale: Good     Standing balance support: No upper extremity supported;During functional activity Standing balance-Leahy Scale: Fair                              Cognition Arousal/Alertness: Awake/alert Behavior During Therapy: WFL for tasks assessed/performed Overall Cognitive Status: Within Functional Limits for tasks assessed                                        Exercises      General Comments        Pertinent Vitals/Pain Pain Assessment: 0-10 Pain Score: 7  Pain Location: Right side of abdomen Pain Descriptors / Indicators: Cramping Pain Intervention(s): Monitored during session;Repositioned;Other (comment)(Notified nurse of cramping and discomfort)    Home Living                      Prior Function            PT Goals (current goals can now be found in the care plan section) Acute Rehab PT Goals Patient Stated Goal: to get better PT Goal Formulation: With patient Time For Goal Achievement: 10/04/17 Potential  to Achieve Goals: Good Progress towards PT goals: Progressing toward goals    Frequency    Min 3X/week      PT Plan Current plan remains appropriate    Co-evaluation              AM-PAC PT "6 Clicks" Daily Activity  Outcome Measure  Difficulty turning over in bed (including adjusting bedclothes, sheets and blankets)?: None Difficulty moving from lying on back to sitting on the side of the bed? : None Difficulty sitting down on and standing up from a chair with arms (e.g., wheelchair, bedside commode, etc,.)?: None Help needed moving to and from a bed to chair (including a wheelchair)?: None Help needed walking in hospital room?: A Little Help needed climbing 3-5 steps with a railing? : A Little 6 Click Score: 22    End of Session Equipment Utilized During Treatment: Gait belt;Oxygen Activity Tolerance: Patient tolerated treatment well Patient left: in bed;with call bell/phone within reach;Other (comment)(Cardiologist entered at end of session) Nurse Communication: Mobility status(informed nurse of side cramps on R side) PT Visit Diagnosis: Unsteadiness on feet (R26.81);Difficulty in walking, not elsewhere classified (R26.2)     Time: 6962-9528 PT Time Calculation (min) (ACUTE ONLY): 34 min  Charges:  $Gait Training: 8-22 mins $Therapeutic Activity: 8-22 mins                    G Codes:  Functional Assessment Tool Used: AM-PAC 6 Clicks Basic Mobility    Fransisca Connors, SPTA    Fransisca Connors 09/21/2017, 4:55 PM

## 2017-09-21 NOTE — Progress Notes (Signed)
Thanks, Elizabeth [name]. She may benefit from pulmonary vasodilators. Should we get Linna Hoff B on board? Khaliq Turay

## 2017-09-21 NOTE — Progress Notes (Signed)
Progress Note  Patient Name: Elizabeth [name] Date of Encounter: 09/21/2017  Primary Cardiologist: Dr. Sallyanne Kuster  Subjective   She feels about the same, SOB with walking to the bathroom, no chest pain.  Inpatient Medications    Scheduled Meds: . carvedilol  12.5 mg Oral BID WC  . clopidogrel  300 mg Oral Once  . clopidogrel  75 mg Oral Daily  . colchicine  0.6 mg Oral BID  . enoxaparin (LOVENOX) injection  1 mg/kg Subcutaneous Q12H  . fluticasone furoate-vilanterol  1 puff Inhalation Daily   And  . umeclidinium bromide  1 puff Inhalation Daily  . irbesartan  150 mg Oral Daily  . pantoprazole  40 mg Oral Daily  . potassium chloride  40 mEq Oral BID  . sodium chloride flush  3 mL Intravenous Q12H  . torsemide  20 mg Oral BID   Continuous Infusions: . sodium chloride     PRN Meds: sodium chloride, acetaminophen, albuterol, ALPRAZolam, guaiFENesin-dextromethorphan, nitroGLYCERIN, ondansetron (ZOFRAN) IV, sodium chloride flush, zolpidem   Vital Signs    Vitals:   09/20/17 2000 09/20/17 2035 09/21/17 0018 09/21/17 0429  BP: (!) 135/91 131/85 126/79 118/70  Pulse: 72 81 80 78  Resp: _0 Temp:  98 F (36.7 C) 97.9 F (36.6 C) 98.5 F (36.9 C)  TempSrc:  Oral Oral Oral  SpO2: 100% 94% 94% 94%  Weight:    204 lb 12.8 oz (92.9 kg)  Height:        Intake/Output Summary (Last 24 hours) at 09/21/2017 0859 Last data filed at 09/20/2017 2044 Gross per 24 hour  Intake -  Output 600 ml  Net -600 ml   Filed Weights   09/19/17 0713 09/20/17 0600 09/21/17 0429  Weight: 227 lb 12.8 oz (103.3 kg) 222 lb 9.6 oz (101 kg) 204 lb 12.8 oz (92.9 kg)    Telemetry    Sinus rhythm in the 62s' with PVC's - Personally Reviewed  ECG    SR with diffuse Twave changes, similiar to previous EKGs - Personally Reviewed  Physical Exam   GEN: Obese female,  No acute distress.   Neck: No JVD Cardiac: RRR, no murmurs, rubs, or gallops.  Respiratory: Clear to auscultation  bilaterally. GI: Soft, nontender, non-distended  MS: Puffiness of bil ankles, mild pitting edema; No deformity. Neuro:  Nonfocal  Psych: Normal affect   Labs    Chemistry Recent Labs  Lab 09/16/17 2222 09/17/17 0529 09/18/17 0018 09/19/17 0346  NA  --  136 135 137  K  --  3.1* 4.0 4.4  CL  --  99* 101 104  CO2  --  _1 GLUCOSE  --  96 93 96  BUN  --  24* 21* 16  CREATININE  --  0.96 1.02* 0.88  CALCIUM  --  9.2 9.1 9.4  PROT 8.2*  --   --   --   ALBUMIN 3.9  --   --   --   AST 43*  --   --   --   ALT 21  --   --   --   ALKPHOS 148*  --   --   --   BILITOT 1.0  --   --   --   GFRNONAA  --  >60 59* >60  GFRAA  --  >60 >60 >60  ANIONGAP  --  _2 Hematology Recent Labs  Lab 09/16/17 0800 09/21/17  0556  WBC 4.9 6.7  RBC 5.36* 4.76  HGB 13.6 11.9*  HCT 44.4 40.1  MCV 82.8 84.2  MCH 25.4* 25.0*  MCHC 30.6 29.7*  RDW 17.3* 18.5*  PLT 208 239    Cardiac Enzymes Recent Labs  Lab 09/17/17 1832 09/18/17 0018  TROPONINI <0.03 <0.03    Recent Labs  Lab 09/16/17 0819  TROPIPOC 0.00     BNP Recent Labs  Lab 09/16/17 0800  BNP 668.9*     DDimer No results for input(s): DDIMER in the last 168 hours.   Radiology    Ct Angio Chest/abd/pel For Dissection W And/or Wo Contrast  Result Date: 09/16/2017  IMPRESSION: Atherosclerotic changes without evidence of aneurysmal dilatation or dissection. Mild celiac axis stenosis. Left thyroid nodule stable from previous exams. The need for nonemergent workup can be determined on a clinical basis. Scattered benign-appearing pulmonary nodule stable from multiple previous exams. Scattered chronic changes within the abdomen. No acute abnormality is noted.   Cardiac Studies   Echocardiogram 05/31/2017 Study Conclusions - Left ventricle: The cavity size was normal. There was moderate focal basal hypertrophy of the septum. Systolic function was normal. The estimated ejection fraction was in the range of  50% to 55%. There is akinesis of the basalinferior myocardium. Features are consistent with a pseudonormal left ventricular filling pattern, with concomitant abnormal relaxation and increased filling pressure (grade 2 diastolic dysfunction). - Ventricular septum: The contour showed diastolic flattening. - Aortic valve: Trileaflet; mildly thickened, mildly calcified leaflets. - Right ventricle: The cavity size was mildly dilated. Wall thickness was normal. - Right atrium: The atrium was moderately dilated. - Tricuspid valve: There was moderate regurgitation. - Pulmonary arteries: Systolic pressure was severely increased. PA peak pressure: 69 mm Hg (S). Impressions: - Compared to the prior study, there has been no significant interval change.  CARDIAC CATH: 09/20/2017  Would continue Treating pulmonary issues and pulmonary hypertension.  As for her coronary and angiography, 2 potential culprit lesions are the graft lesion and SVG-OM-dCx as well as the anastomotic lesion and OM 3.  Due to the length of the diagnostic case (almost 30 minutes of fluoroscopy) and the difficulty engaging some of the vessels, I felt it prudent to allow the patient to recover from the current procedure before we decided the most appropriate course of action to intervene.  Stenting the graft would be relatively straightforward, however intervention on the anastomotic/ostial lesion in the OM 3 would be difficult and potentially would only be able to be done by balloon angioplasty.  I will start Plavix tonight 300 mg and then 75 mg daily starting tomorrow in anticipation of possible staged PCI on Wednesday to allow for recovery from this procedure.  Will need review by the interventional team to determine best course of action.     Patient Profile     61 y.o. female with a history of CAD s/p CABG 05/2016 w/ LIMA-LAD, SVG-OM-CFX, SVG-RCA, CHF, COPD on home oxygen, hyperlipidemia, hypertension,  fibromyalgia, GERD, PAF formerly on amiodarone now on Eliquis, remote cocaine use and lupus who presented with chest pain since yesterday and also recent weight gain, DOE and bilateral lower extremity swelling. Mild CHF on CXR, negative troponin, BNP 668.9. Negative for PE.   Assessment & Plan    1. Acute on chronic diastolic CHF: EF 47-65% with grade 2 DD by echo 05/2017. She is still fluid overloaded, PCWP 19-20 mmHg yesterday,  I will restart lasix 40 mg iv Q12H.   2. CAD: Hx  of CABG X4 05/2016. On BB, ARB and statin. Not on aspirin due to need for anticoagulation. Troponin negative X3. Eliquis was held. Cath yesterday showed 2 potential culprit lesions are the graft lesion and SVG-OM-dCx as well as the anastomotic lesion and OM 3.  Per Dr Ellyn Hack stenting the graft would be relatively straightforward, however intervention on the anastomotic/ostial lesion in the OM 3 would be difficult and potentially would only be able to be done by balloon angioplasty. She was loaded with Plavix 300 mg yesterday, now on 75 mg daily. We will place her on the schedule for PCI on Wednesday by Dr [place].  3. PAF:  formerly on amio, CHADSVasc 6 (TIA 2, gender, hypertension, CAD, CHF) on Elliquis. Maintaining sinus rhythm on carvedilol. Hb 13.6  4. Severe pulmonary hypertension: Pt has known hx of COPD, on oxygen. PFT last year showed severe diffusion capacity. Repeat PFTs showed Moderate Obstructive Airways Disease, Response to bronchodilator, Minimal Restriction -Parenchymal, Severe Diffusion Defect. The diffusion defect and reduced lung volumes suggest an early parenchymal process. Moderate airway obstruction is present. A clinical trial of bronchodilators may be beneficial in view of the airway obstruction. In view of the severity of the diffusion defect, studies with exercise would be helpful to evaluate the presence of hypoxemia. Pulmonary saw her and ordered ANA and RF.  I will ask Dr Haroldine Laws to help with  management of pulmonary hypertension.  5. Hypokalemia: resolved   Ena Dawley, MD 09/21/2017

## 2017-09-21 NOTE — Progress Notes (Signed)
PT Cancellation Note  Patient Details Name: Elizabeth [name] MRN: [id number] DOB: 03/08/1956   Cancelled Treatment:    Reason Eval/Treat Not Completed: (P) (Pt had just recieved breakfast and asked to do PT later. Will go back to see pt later on today.)   Fransisca Connors, [place] 09/21/2017, 9:20 AM

## 2017-09-21 NOTE — Consult Note (Signed)
Advanced Heart Failure Team Consult Note  PCP: Ledell Noss Primary Cardiologist:  Dr. Sallyanne Kuster  Reason for Consultation: Pulmonary Hypertension  HPI:    Elizabeth [name] is seen today for evaluation of Pulmonary Hypertension at the request of Dr. Meda Coffee.   Elizabeth [name] is a 61 y.o. female with a history of CAD s/p CABG 05/2016 w/ LIMA-LAD, SVG-OM-CFX, SVG-RCA, CHF, COPD on home oxygen, hyperlipidemia, hypertension, fibromyalgia, GERD, PAF formerly on amiodarone now on Eliquis, remote cocaine use and lupus.  Echo 05/31/17 LVEF 50-55%, Grade 2 DD, Mild RV dilation, Mod RAE, Mod TR, PA peak pressure 69 mm Hg  Pt last seen in [facility] clinic 09/07/17 by Jiles Crocker, PA. Noted to be mildly volume overloaded and lasix switched to torsemide.   She presented to [facility] 09/16[facility]17 with chest pain, weight gain, and BLE edema. She described her CP as similar to her previous MI, but then it progressed to more of a stabbing pain that radiated to her back, unlike her previous pain. She had no relief from ASA or NTG via EMS and was bought to ED. Given IV lasix.  Pertinent labs on admission include negative troponin, BNP 669, K 3.2, Creatinine 1.08. CXR with mild bilateral interstitial prominence -> Pneumonitis cannot be excluded. CTA of the chest with no evidence of aneurysm, dissection, or PE.   Little relief of her symptoms over the weekend with diuresis. Switched back to po diuretics on 09/19/17. Despite improved volume status on exam, no improvement of symptoms. So cath planned.  LHC 09/20/17 as below with culprit lesions for her chest pain in the graft lesion and SVG-OM-dCx as well as anastomotic lesion and OM 3. Plan PCI Wednesday by Dr. Martinique.   With Mod/Sev PAH, HF team asked to assist with medication adjustments.  Pulmonary also consulted. They recommended no targeted PAH therapy at this time.   On my arrival pt eating a [employer] chicken pot pie (1970 mg of sodium). She remains SOB with any exertion.  Out of breath repositioning herself in the bed.  She has chronic NYHA IIIb symptoms. SOB changing clothes, bathing, or with her ADLs around the house. Occasional peripheral edema and orthopnea. Sleeps on 2-3 pillows. Denies snoring. Has never had sleep study. She does occasionally get lightheadedness with rapid standing, but not marked or limiting. She has been this SOB for months, unclear how many.   She does feel somewhat better from admission. Less orthopneic and slightly less SOB.   L/RHC 09/20/17  Prox Cx to Mid Cx lesion is 35% stenosed.  Prox RCA to Mid RCA lesion is 100% stenosed.  SVG-RCA: Prox Graft lesion is 50% stenosed.  Prox LAD lesion is 50% stenosed. Prox LAD to Mid LAD lesion is 75% stenosed at 2nd Diag. There is competitive flow from the LIMA graft distally.  [name] 1st Diag lesion is 70% stenosed. mid 1st Diag 70%  LIMA-dLAD was very difficult to engage via femoral access. With nonselective angiography of the graft is widely patent to the very distal LAD. Competitive flow noted back to the second diagonal branch.  [name] 3rd Mrg-1 lesion is 100% stenosed. -Flush occlusion from the native circumflex  [name] 3rd Mrg-2 lesion is 85% stenosed at the anastomosis site of the SVG  Mid Cx to Dist Cx lesion is 80% stenosed.  Dist Cx lesion is 75% stenosed. Just prior to insertion of second limb of SVG-OM3-dCx  SVG-OM3-dCx mid Graft to Dist Graft lesion before [name] 3rd Mrg is 80% stenosed -prior  to insertion on 3rd Mrg RHC Procedural Findings: Hemodynamics (mmHg) RA mean 12 RV 64/6 PA 64/28 (41) PCWP 19 AO 131/82 Cardiac Output (Fick) 4.54 Cardiac Index (Fick) 2.25 TPG 22 PVR 5  Review of Systems: [y] = yes, _0  = no   General: Weight gain _1 ; Weight loss _2 ; Anorexia _3 ; Fatigue [y]; Fever _4 ; Chills _5 ; Weakness [y]  Cardiac: Chest pain/pressure _6 ; Resting SOB _7 ; Exertional SOB [y]; Orthopnea [y]; Pedal Edema [y]; Palpitations _8 ; Syncope _9 ; Presyncope _10 ;  Paroxysmal nocturnal dyspnea_11   Pulmonary: Cough [y]; Wheezing_12 ; Hemoptysis_13 ; Sputum _14 ; Snoring [y]  GI: Vomiting_15 ; Dysphagia_16 ; Melena_17 ; Hematochezia _18 ; Heartburn_19 ; Abdominal pain _20 ; Constipation _21 ; Diarrhea _22 ; BRBPR _23   GU: Hematuria_24 ; Dysuria _25 ; Nocturia_26   Vascular: Pain in legs with walking _27 ; Pain in feet with lying flat _28 ; Non-healing sores _29 ; Stroke _30 ; TIA _31 ; Slurred speech _32 ;  Neuro: Headaches_33 ; Vertigo_34 ; Seizures_35 ; Paresthesias_36 ;Blurred vision _37 ; Diplopia _38 ; Vision changes _39   Ortho/Skin: Arthritis [y]; Joint pain [y]; Muscle pain _40 ; Joint swelling _41 ; Back Pain _42 ; Rash _43   Psych: Depression_44 ; Anxiety_45   Heme: Bleeding problems _46 ; Clotting disorders _47 ; Anemia _48   Endocrine: Diabetes _49 ; Thyroid dysfunction_50   Home Medications Prior to Admission medications   Medication Sig Start Date End Date Taking? Authorizing Provider  acetaminophen (TYLENOL) 500 MG tablet Take 500 mg by mouth every 6 (six) hours as needed for mild pain.   Yes [provider]  albuterol (PROVENTIL HFA;VENTOLIN HFA) 108 (90 Base) MCG/ACT inhaler Inhale 2 puffs into the lungs every 6 (six) hours as needed for wheezing or shortness of breath. 12/08/16  Yes Holley Raring, MD  albuterol (PROVENTIL) (2.5 MG/3ML) 0.083% nebulizer solution Take 3 mLs (2.5 mg total) by nebulization every 6 (six) hours as needed for wheezing or shortness of breath. 06/25/17  Yes Chesley Mires, MD  apixaban (ELIQUIS) 5 MG TABS tablet Take 1 tablet (5 mg total) by mouth 2 (two) times daily. 05/18/17  Yes Ledell Noss, MD  carvedilol (COREG) 12.5 MG tablet TAKE 1 TABLET(12.5 MG) BY MOUTH TWICE DAILY 03/07/16  Yes Liberty Handy, MD  colchicine 0.6 MG tablet Take 1 tablet (0.6 mg total) by mouth 2 (two) times daily. 09/03/17 09/03/18 Yes Bartholomew Crews, MD  Fluticasone-Umeclidin-Vilant (TRELEGY ELLIPTA) 100-62.5-25 MCG/INH AEPB Inhale 1 puff into the lungs daily. 03/04/17   Yes Mannam, Praveen, MD  furosemide (LASIX) 40 MG tablet Take 40-80 mg by mouth 2 (two) times daily. 2 tabs every morning -1 tablet in the afternoon 07/06/17  Yes [provider]  pantoprazole (PROTONIX) 40 MG tablet TAKE 1 TABLET(40 MG) BY MOUTH DAILY 12/12/16  Yes Ledell Noss, MD  valsartan (DIOVAN) 320 MG tablet Take 320 mg by mouth daily. 10/04/16  Yes [provider]  nitroGLYCERIN (NITROSTAT) 0.4 MG SL tablet Place 1 tablet (0.4 mg total) under the tongue every 5 (five) minutes x 3 doses as needed for chest pain. 09/18/17   Daune Perch, NP  potassium chloride SA (K-DUR,KLOR-CON) 20 MEQ tablet Take 1 tablet (20 mEq total) by mouth daily. 09/18/17   Daune Perch, NP  rosuvastatin (CRESTOR) 20 MG tablet Take 1 tablet (20 mg total) by mouth at bedtime. Patient not taking: Reported on 09/16/2017 07/02/17 07/02/18  Shela Leff, MD  torsemide [facility]) 20 MG tablet SEE NOTES 09/20/17   Croitoru, Dani Gobble, MD    Past Medical History: Past Medical History:  Diagnosis Date  . CAD (coronary artery disease)    NSTEMI 08/2011:  LHC 08/21/11: mLAD 60-70%, pCFX occluded, dRCA chronic occlusion with L-R collats, EF 40-45%, inf AK.  PCI:  BMS to CFX.  Marland Kitchen CHF (congestive heart failure) ([place]) 05/31/2017  . COPD (chronic obstructive pulmonary disease) ([place]) 11/17/2016  . Depression   . Fibroids   . GERD (gastroesophageal reflux disease)   . Gout   . HLD (hyperlipidemia)    Chol = 235, LDL = 156 (08/2010)  . Hypertension   . Lupus 2009   ANA + 10/2008, repeat ANA + (05/2009), on that visit 05/2009 following labs obtained RF <20, CRP <0.4, ANA titers 1:80,   . On home oxygen therapy    "2L; 24/7 (05/31/2017)  . Pulmonary nodules 05/2008   noted on CXR and CT 05/2008, repeat CT 10/2008 - Stable small bilateral pulmonary nodules measuring up to 6 m m  . Stroke [facility]) 04/2014   "mini stroke"; denies residual on 05/31/2017    Past Surgical History: Past Surgical History:  Procedure  Laterality Date  . CORONARY ANGIOPLASTY WITH STENT PLACEMENT    . FRACTURE SURGERY    . TUBAL LIGATION    . VAGINAL HYSTERECTOMY  05/2003    Family History: Family History  Problem Relation Age of Onset  . Heart disease Mother   . Cervical cancer Sister   . Prostate cancer Brother   . Colon cancer Neg Hx     Social History: Social History   Socioeconomic History  . Marital status: Divorced    Spouse name: None  . Number of children: None  . Years of education: None  . Highest education level: None  Social Needs  . Financial resource strain: None  . Food insecurity - worry: None  . Food insecurity - inability: None  . Transportation needs - medical: None  . Transportation needs - non-medical: None  Occupational History  . Occupation: Retired  Tobacco Use  . Smoking status: Former Smoker    Packs/day: 0.12    Years: 45.00    Pack years: 5.40    Types: Cigarettes    Last attempt to quit: 11/16/2016    Years since quitting: 0.8  . Smokeless tobacco: Never Used  Substance and Sexual Activity  . Alcohol use: Yes    Alcohol/week: 8.4 oz    Types: 7 Cans of beer, 7 Standard drinks or equivalent per week    Comment: 1 beer/day  . Drug use: No    Comment: 05/31/2017 "nothing since ~ 2016"  . Sexual activity: No  Other Topics Concern  . None  Social History Narrative   Pt lives with sister and 2 nieces.    Allergies:  No Known Allergies  Objective:    Vital Signs:   Temp:  [97.9 F (36.6 C)-98.5 F (36.9 C)] 98.3 F (36.8 C) (11/06 1159) Pulse Rate:  [0-88] 80 (11/06 1159) Resp:  [0-27] 18 (11/06 1159) BP: (106-159)/(66-108) 106/68 (11/06 1159) SpO2:  [0 %-100 %] 97 % (11/06 1159) Weight:  [204 lb 12.8 oz (92.9 kg)] 204 lb 12.8 oz (92.9 kg) (11/06 0429) Last BM Date: 09/21/17  Weight change: Filed Weights   09/19/17 0713 09/20/17 0600 09/21/17 0429  Weight: 227 lb 12.8 oz (103.3 kg) 222 lb 9.6 oz (101 kg) 204 lb 12.8 oz (92.9 kg)  Intake/Output:   Intake/Output Summary (Last 24 hours) at 09/21/2017 1405 Last data filed at 09/20/2017 2044 Gross per 24 hour  Intake -  Output 600 ml  Net -600 ml    Physical Exam    General:  Obese. Dyspneic with conversation.  HEENT: Normal Neck: supple. JVP difficult, but does not appear elevated past 6-7 cm. Carotids 2+ bilat; no bruits. No lymphadenopathy or thyromegaly appreciated. Cor: PMI nondisplaced. Regular rate & rhythm. No rubs, gallops or murmurs. Lungs: Diminished basilar sounds. Distant, but clear Abdomen: Obese, soft, nontender, nondistended. No hepatosplenomegaly. No bruits or masses. Good bowel sounds. Extremities: no cyanosis, clubbing, or rash. Trace to 1+ ankle edema.  Neuro: alert & orientedx3, cranial nerves grossly intact. moves all 4 extremities w/o difficulty. Affect pleasant  Telemetry   NSR 80s, Personally reviewed.   EKG    09/18/17 NSR  Labs   Basic Metabolic Panel: Recent Labs  Lab 09/16/17 0800 09/17/17 0529 09/18/17 0018 09/19/17 0346  NA 137 136 135 137  K 3.2* 3.1* 4.0 4.4  CL 101 99* 101 104  CO2 21* _0 GLUCOSE 108* 96 93 96  BUN 22* 24* 21* 16  CREATININE 1.08* 0.96 1.02* 0.88  CALCIUM 9.1 9.2 9.1 9.4    Liver Function Tests: Recent Labs  Lab 09/16/17 2222  AST 43*  ALT 21  ALKPHOS 148*  BILITOT 1.0  PROT 8.2*  ALBUMIN 3.9   No results for input(s): LIPASE, AMYLASE in the last 168 hours. No results for input(s): AMMONIA in the last 168 hours.  CBC: Recent Labs  Lab 09/16/17 0800 09/21/17 0556  WBC 4.9 6.7  HGB 13.6 11.9*  HCT 44.4 40.1  MCV 82.8 84.2  PLT 208 239    Cardiac Enzymes: Recent Labs  Lab 09/17/17 1832 09/18/17 0018  TROPONINI <0.03 <0.03    BNP: BNP (last 3 results) Recent Labs    03/09/17 1148 05/31/17 0512 09/16/17 0800  BNP 500.0* 602.4* 668.9*    ProBNP (last 3 results) Recent Labs    08/06/17 1227  PROBNP 2,160.0*     CBG: No results for input(s): GLUCAP  in the last 168 hours.  Coagulation Studies: No results for input(s): LABPROT, INR in the last 72 hours.   Imaging    No results found.   Medications:     Current Medications: . carvedilol  12.5 mg Oral BID WC  . [START ON 09/22/2017] clopidogrel  75 mg Oral Daily  . colchicine  0.6 mg Oral BID  . enoxaparin (LOVENOX) injection  1 mg/kg Subcutaneous Q12H  . fluticasone furoate-vilanterol  1 puff Inhalation Daily   And  . umeclidinium bromide  1 puff Inhalation Daily  . furosemide  40 mg Intravenous BID  . irbesartan  150 mg Oral Daily  . pantoprazole  40 mg Oral Daily  . potassium chloride  40 mEq Oral BID  . sodium chloride flush  3 mL Intravenous Q12H     Infusions: . sodium chloride       Patient Profile   Elizabeth [name] is a 61 y.o. female with a history of CAD s/p CABG 05/2016 w/ LIMA-LAD, SVG-OM-CFX, SVG-RCA, CHF, COPD on home oxygen, hyperlipidemia, hypertension, fibromyalgia, GERD, PAF formerly on amiodarone now on Eliquis, remote cocaine use and lupus.  Admitted with CP and SOB. Found to have culprit lesions on [facility] and planned for PCI. Noted to have Mod/Sev Pulmonary HTN, so HF team and pulmonary consulted.   Assessment/Plan   1. Acute  on chronic diastolic CHF: EF 46-50% with grade 2 DD by echo 05/2017. - PCWP 19-20 mmHg 09/20/17 - Volume status difficult due to body habitus, but appears at least mildly up. (And elevated by RHC yesterday) - Would continue IV lasix 40 mg BID today, then consider back to torsemide tomorrow. Weights inaccurate.  - Repeat echo to make sure no systolic component with worsened CAD.  - Counseled on salt restriction, with [employer] at bedside.   2. CAD: Hx of CABG X4 05/2016. On BB, ARB and statin. Not on aspirin due to need for anticoagulation. Troponin negative X3. Eliquis was held. - Cath 09/20/17 with potential culprit lesions are the graft lesion and SVG-OM-dCx as well as the anastomotic lesion and OM 3. Plan for PCI 09/22/17.   - No chest pain today.  - Continue plavix 75 mg daily. - Continue coreg 12.5 mg BID  3. PAF:   - Remains in NSR on coreg.  - CHADSVASc 6. On Eliquis. No bleeding.   4. Severe pulmonary hypertension: Pt has known hx of COPD, on oxygen. - Likely primarily WHO group 3 with component of Group 2. Repeat Echo pending. - Most recent PFTs were performed on 06/04/2016 showed an FEV1 to FEC ratio of 72%.  DLCO was reduced to 46% of predicted.  Vital capacity was 78% of predicted. - Needs Sleep study as outpatient.  - ANA positive (H/o Lupus) - RF negative.  - ESR normal - Pulmonary recommends bronchodilator therapy and fluid optimization + sleep study. They recommend NO targeted PAH therapy at this time.   5. Hypokalemia:  - Resolved. Continue to supp.   6. HTN - Continue irbesartan 150 mg BID  Needs extensive dietary and salt/fluid education. Her volume and symptoms seemed to have improved from admit, no longer orthopneic and less SOB, but still SOB with mild exertion.   Per pulm no clear indication for targeted PAH therapy at this time. Will discuss with MD.   Length of Stay: [place], PA-C  09/21/2017, 2:05 PM  Advanced Heart Failure Team Pager [phone] (M-F; 7a - 4p)  Please contact [facility] for night-coverage after hours (4p -7a ) and weekends on [email or web address]  Patient seen and examined with the above-signed Advanced Practice Provider and/or Housestaff. I personally reviewed laboratory data, imaging studies and relevant notes. I independently examined the patient and formulated the important aspects of the plan. I have edited the note to reflect any of my changes or salient points. I have personally discussed the plan with the patient and/or family.  Echo and cath images reviewed. 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 However given h/o possible SLE also have to consider  the possibility of WHO Group I disease though I think this is less likely particularly with current ANA 1:80. For now would focus on the following:  1. BP control 2. Careful monitoring of volume status (agree with torsemide). Discussed use of sliding scale diuretics 3. Continue home O2 4. Refer for sleep study 5. Enroll in cardiac/pulmonary rehab (will qualify for both with upcoming stent and O2 dependency).  6. Encourage weight loss.   Would not use selective pulmonary vasodilators at this point.  We will follow from a distance. Please call with questions.   Glori Bickers, MD  4:35 PM

## 2017-09-21 NOTE — Progress Notes (Signed)
PULMONARY / CRITICAL CARE MEDICINE   Name: Elizabeth [name] MRN: [id number] DOB: 1956-05-26    ADMISSION DATE:  09/16/2017 CONSULTATION DATE: 09/18/2017    CHIEF COMPLAINT:  Dyspnea  HISTORY OF PRESENT ILLNESS:   This is a 61 year old with a history of coronary artery disease who is also on home O2 for chronic hypoxia.  She presented complaining of increasing dyspnea and increasing oxygen requirements at home.  She has also been having midline chest pain, which she says will come on at times without provocation.  There is no radiation into the neck or arms.  There is no associated nausea or vomiting.  She has a history of CHF, and has been treated with diuresis but her dyspnea persists.  A CTA was performed on 11/1 and showed scattered small nodules which are stable, some minimal diffuse emphysematous changes, and no evidence of pulmonary embolism.  Should be noted that the patient is chronically anticoagulated.  He does have a history of a positive ANA, titer of 1-80 but the only complaint she has of joint pain is in her right great toe.  She has no skin changes or dry eyes.  Her most recent PFTs were performed on 06/04/2016 showed an FEV1 to FEC ratio of 72%.  DLCO was reduced to 46% of predicted.  Vital capacity was 78% of predicted.  Her most recent echocardiogram performed on 7/20 showed an ejection fraction of 50-55% and an estimated PA pressure 69. Mild OSA by PSG 2013   SUBJECTIVE:  [name] R heart cath 11/5 reviewed as below Feels about the same.  Remains dyspneic with just walking through the hospital room  VITAL SIGNS: BP 114/66   Pulse 88   Temp 98.5 F (36.9 C) (Oral)   Resp 20   Ht _0  (1.575 m)   Wt 92.9 kg (204 lb 12.8 oz) Comment: scaale b  LMP 12/30/1994   SpO2 94%   BMI 37.46 kg/m     INTAKE / OUTPUT: I/O last 3 completed shifts: In: 110 [P.O.:100; I.V.:10] Out: 2601 [Urine:2600; Stool:1]  PHYSICAL  EXAMINATION: General: Obese woman, tachypneic and in some mild distress after walking through the room, bathing at the sink Neuro: Awake, appropriate, nonfocal Cardiovascular: Regular, no murmur, no tachycardia Lungs: Decreased basilar breath sounds, no crackles, no wheezes Abdomen: Obese, soft, nontender   LABS:  BMET Recent Labs  Lab 09/17/17 0529 09/18/17 0018 09/19/17 0346  NA 136 135 137  K 3.1* 4.0 4.4  CL 99* 101 104  CO2 _1 BUN 24* 21* 16  CREATININE 0.96 1.02* 0.88  GLUCOSE 96 93 96    Electrolytes Recent Labs  Lab 09/17/17 0529 09/18/17 0018 09/19/17 0346  CALCIUM 9.2 9.1 9.4    CBC Recent Labs  Lab 09/16/17 0800 09/21/17 0556  WBC 4.9 6.7  HGB 13.6 11.9*  HCT 44.4 40.1  PLT 208 239    Coag's No results for input(s): APTT, INR in the last 168 hours.  Sepsis Markers No results for input(s): LATICACIDVEN, PROCALCITON, O2SATVEN in the last 168 hours.  ABG Recent Labs  Lab 09/20/17 1926  PHART 7.475*  PCO2ART 34.3  PO2ART 53.0*    Liver Enzymes Recent Labs  Lab 09/16/17 2222  AST 43*  ALT 21  ALKPHOS 148*  BILITOT 1.0  ALBUMIN 3.9    Cardiac Enzymes Recent Labs  Lab 09/17/17 1832 09/18/17 0018  TROPONINI <0.03 <0.03    Glucose No results  for input(s): GLUCAP in the last 168 hours.  Imaging No results found.  R heart cath 11/5:  PAP 64/28 (41) PAOP 20 RAP (12) RVP 64/6  L heart cath:  Diffuse coronary disease in both her native and graft anatomy as detailed by Dr. Ellyn Hack.  Potential targets for intervention include SVG graft to the OM/circumflex and also an anastomotic lesion at OM 3   DISCUSSION: 61 year old obese woman with hypoxemic respiratory failure and multifactorial secondary pulmonary hypertension (PAP 64/28,41 on 11/5).  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  She has benefited some from diuresis, may have more volume to be successfully and effectively removed.  Note that her coronary disease is being evaluated and this could possibly be a contributor to her dyspnea as well.  Unclear whether she will undergo targeted intervention but a possible repeat catheterization is planned.  She is on bronchodilators.  Could consider empiric treatment of sleep apnea but again this has not been formally established, and inpatient initiation of CPAP is usually poorly tolerated. I would prepose that decondition is a factor here, and the she would benefit from cardiopulm rehab.  Her pulmonary hypertension appears to be multifactorial with evidence now to confirm that there is significant WHO group 3 component from intrinsic pulmonary process, chronic hypoxemia.  As discussed 11/5 there is no benefit to targeted [place] therapy to address this component of her PAH.  I would concentrate instead on adequate bronchodilator therapy, optimization of her volume status, and urgently getting her sleep study as an outpatient so that we can initiate treatment of her presumed sleep apnea.   If she continued to have pulmonary hypertension associated with her in the future that we deemed associated  w left ventricular dysfunction, and volume status was optimized, then PDE-5 inhibitors could be considered at some point down the line.  I WOULD NOT initiate targeted PAH therapy at this time.     Baltazar Apo, MD, PhD 09/21/2017, 9:38 AM [facility] [phone] or if no answer [phone]

## 2017-09-21 NOTE — Progress Notes (Signed)
-  Houston Siren was gonna ask Linna Hoff - also Byrum from [facility] is seeing her.  [name], MD

## 2017-09-21 NOTE — Progress Notes (Signed)
Patient got up to go to the bathroom while on bedrest. Patient was repeatedly reminded that she was not supposed to get up. Md on call was notified that the patient had gotten up. Will continue to monitor for bleeding or complications.

## 2017-09-22 ENCOUNTER — Encounter (HOSPITAL_COMMUNITY)
Admission: EM | Disposition: A | Discharge status: Home / Self Care | Payer: Self-pay | Source: Home / Self Care | Attending: Cardiovascular Disease

## 2017-09-22 ENCOUNTER — Ambulatory Visit (HOSPITAL_COMMUNITY): Payer: Medicare Other

## 2017-09-22 DIAGNOSIS — I25719 Atherosclerosis of autologous vein coronary artery bypass graft(s) with unspecified angina pectoris: Secondary | ICD-10-CM

## 2017-09-22 DIAGNOSIS — I361 Nonrheumatic tricuspid (valve) insufficiency: Secondary | ICD-10-CM

## 2017-09-22 HISTORY — PX: CORONARY STENT INTERVENTION: CATH118234

## 2017-09-22 LAB — BASIC METABOLIC PANEL
ANION GAP: 13 (ref 5–15)
BUN: 17 mg/dL (ref 6–20)
CALCIUM: 9.4 mg/dL (ref 8.9–10.3)
CO2: 20 mmol/L — ABNORMAL LOW (ref 22–32)
Chloride: 106 mmol/L (ref 101–111)
Creatinine, Ser: 1.16 mg/dL — ABNORMAL HIGH (ref 0.44–1.00)
GFR, EST AFRICAN AMERICAN: 59 mL/min — AB (ref >60)
GFR, EST NON AFRICAN AMERICAN: 50 mL/min — AB (ref >60)
GLUCOSE: 107 mg/dL — AB (ref 65–99)
Potassium: 4.4 mmol/L (ref 3.5–5.1)
SODIUM: 139 mmol/L (ref 135–145)

## 2017-09-22 LAB — ECHOCARDIOGRAM COMPLETE
Height: 62 in
Weight: 3526.4 oz

## 2017-09-22 LAB — CBC
HCT: 41.8 % (ref 36.0–46.0)
Hemoglobin: 13 g/dL (ref 12.0–15.0)
MCH: 26.2 pg (ref 26.0–34.0)
MCHC: 31.1 g/dL (ref 30.0–36.0)
MCV: 84.1 fL (ref 78.0–100.0)
PLATELETS: 244 10*3/uL (ref 150–400)
RBC: 4.97 MIL/uL (ref 3.87–5.11)
RDW: 18.3 % — AB (ref 11.5–15.5)
WBC: 6.4 10*3/uL (ref 4.0–10.5)

## 2017-09-22 LAB — PROTIME-INR
INR: 1.06
PROTHROMBIN TIME: 13.7 s (ref 11.4–15.2)

## 2017-09-22 LAB — POCT ACTIVATED CLOTTING TIME: Activated Clotting Time: 604 seconds

## 2017-09-22 SURGERY — CORONARY STENT INTERVENTION
Anesthesia: LOCAL

## 2017-09-22 MED ORDER — BIVALIRUDIN TRIFLUOROACETATE 250 MG IV SOLR
INTRAVENOUS | Status: AC
  Filled 2017-09-22: qty 250

## 2017-09-22 MED ORDER — IOPAMIDOL (ISOVUE-370) INJECTION 76%
INTRAVENOUS | Status: AC
  Filled 2017-09-22: qty 125

## 2017-09-22 MED ORDER — MORPHINE SULFATE (PF) 4 MG/ML IV SOLN
2.0000 mg | Freq: Once | INTRAVENOUS | Status: AC
  Administered 2017-09-22: 2 mg via INTRAVENOUS
  Filled 2017-09-22: qty 1

## 2017-09-22 MED ORDER — HEPARIN (PORCINE) IN NACL 2-0.9 UNIT/ML-% IJ SOLN
INTRAMUSCULAR | Status: AC | PRN
  Administered 2017-09-22: 1000 mL via INTRA_ARTERIAL

## 2017-09-22 MED ORDER — LIDOCAINE HCL (PF) 1 % IJ SOLN
INTRAMUSCULAR | Status: AC
  Filled 2017-09-22: qty 30

## 2017-09-22 MED ORDER — MIDAZOLAM HCL 2 MG/2ML IJ SOLN
INTRAMUSCULAR | Status: DC | PRN
  Administered 2017-09-22: 2 mg via INTRAVENOUS

## 2017-09-22 MED ORDER — LIDOCAINE HCL (PF) 1 % IJ SOLN
INTRAMUSCULAR | Status: DC | PRN
  Administered 2017-09-22: 10 mL

## 2017-09-22 MED ORDER — SODIUM CHLORIDE 0.9% FLUSH: 3.0000 mL | INTRAVENOUS | Status: DC | PRN

## 2017-09-22 MED ORDER — FENTANYL CITRATE (PF) 100 MCG/2ML IJ SOLN
INTRAMUSCULAR | Status: DC | PRN
  Administered 2017-09-22: 25 ug via INTRAVENOUS

## 2017-09-22 MED ORDER — FENTANYL CITRATE (PF) 100 MCG/2ML IJ SOLN
INTRAMUSCULAR | Status: AC
  Filled 2017-09-22: qty 2

## 2017-09-22 MED ORDER — MIDAZOLAM HCL 2 MG/2ML IJ SOLN
INTRAMUSCULAR | Status: AC
  Filled 2017-09-22: qty 2

## 2017-09-22 MED ORDER — ASPIRIN 81 MG PO CHEW
81.0000 mg | CHEWABLE_TABLET | Freq: Every day | ORAL | Status: DC
  Administered 2017-09-23: 10:00:00 81 mg via ORAL
  Filled 2017-09-22: qty 1

## 2017-09-22 MED ORDER — OXYCODONE-ACETAMINOPHEN 5-325 MG PO TABS
1.0000 | ORAL_TABLET | Freq: Three times a day (TID) | ORAL | Status: DC | PRN
Start: 2017-09-22 — End: 2017-09-23
  Administered 2017-09-22: 1 via ORAL
  Filled 2017-09-22: qty 1

## 2017-09-22 MED ORDER — BIVALIRUDIN BOLUS VIA INFUSION - CUPID
INTRAVENOUS | Status: DC | PRN
  Administered 2017-09-22: 75 mg via INTRAVENOUS

## 2017-09-22 MED ORDER — HEPARIN (PORCINE) IN NACL 2-0.9 UNIT/ML-% IJ SOLN
INTRAMUSCULAR | Status: AC
  Filled 2017-09-22: qty 1000

## 2017-09-22 MED ORDER — SODIUM CHLORIDE 0.9 % IV SOLN
INTRAVENOUS | Status: DC | PRN
  Administered 2017-09-22: 1.75 mg/kg/h via INTRAVENOUS

## 2017-09-22 MED ORDER — FUROSEMIDE 80 MG PO TABS: 80.0000 mg | ORAL_TABLET | Freq: Two times a day (BID) | ORAL | Status: DC

## 2017-09-22 MED ORDER — IOPAMIDOL (ISOVUE-370) INJECTION 76%
INTRAVENOUS | Status: DC | PRN
  Administered 2017-09-22: 90 mL via INTRAVENOUS

## 2017-09-22 MED ORDER — NITROGLYCERIN 1 MG/10 ML FOR IR/CATH LAB
INTRA_ARTERIAL | Status: AC
  Filled 2017-09-22: qty 10

## 2017-09-22 MED ORDER — ANGIOPLASTY BOOK
Freq: Once | Status: AC
  Administered 2017-09-23: 01:00:00
  Filled 2017-09-22: qty 1

## 2017-09-22 MED ORDER — NITROGLYCERIN 1 MG/10 ML FOR IR/CATH LAB
INTRA_ARTERIAL | Status: DC | PRN
  Administered 2017-09-22: 200 ug via INTRACORONARY

## 2017-09-22 MED ORDER — SODIUM CHLORIDE 0.9 % WEIGHT BASED INFUSION: 1.0000 mL/kg/h | INTRAVENOUS | Status: AC

## 2017-09-22 MED ORDER — TORSEMIDE 20 MG PO TABS
40.0000 mg | ORAL_TABLET | Freq: Two times a day (BID) | ORAL | Status: DC
  Administered 2017-09-22 – 2017-09-23 (×2): 40 mg via ORAL
  Filled 2017-09-22 (×2): qty 2

## 2017-09-22 MED ORDER — SODIUM CHLORIDE 0.9 % IV SOLN: 250.0000 mL | INTRAVENOUS | Status: DC | PRN

## 2017-09-22 MED ORDER — SODIUM CHLORIDE 0.9% FLUSH
3.0000 mL | Freq: Two times a day (BID) | INTRAVENOUS | Status: DC
  Administered 2017-09-23: 3 mL via INTRAVENOUS

## 2017-09-22 SURGICAL SUPPLY — 14 items
BALLN EUPHORA RX 2.0X12 (BALLOONS) ×2
BALLOON EUPHORA RX 2.0X12 (BALLOONS) IMPLANT
CATH LAUNCHER 6FR AL1 (CATHETERS) IMPLANT
CATHETER LAUNCHER 6FR AL1 (CATHETERS) ×2
DEVICE SPIDERFX EMB PROT 3MM (WIRE) ×1 IMPLANT
KIT ENCORE 26 ADVANTAGE (KITS) ×2 IMPLANT
KIT HEART LEFT (KITS) ×2 IMPLANT
PACK CARDIAC CATHETERIZATION (CUSTOM PROCEDURE TRAY) ×2 IMPLANT
SHEATH PINNACLE 6F 10CM (SHEATH) ×1 IMPLANT
STENT RESOLUTE ONYX 3.5X18 (Permanent Stent) ×1 IMPLANT
TRANSDUCER W/STOPCOCK (MISCELLANEOUS) ×2 IMPLANT
TUBING CIL FLEX 10 FLL-RA (TUBING) ×2 IMPLANT
WIRE ASAHI PROWATER 180CM (WIRE) ×1 IMPLANT
WIRE EMERALD 3MM-J .035X150CM (WIRE) ×1 IMPLANT

## 2017-09-22 NOTE — Progress Notes (Signed)
  Echocardiogram 2D Echocardiogram has been performed.  Jennette Dubin 09/22/2017, 11:25 AM

## 2017-09-22 NOTE — H&P (View-Only) (Signed)
Progress Note  Patient Name: Elizabeth [name] Date of Encounter: 09/22/2017  Primary Cardiologist: Dr. Sallyanne Kuster  Subjective   Breathing well, still not at baseline  Inpatient Medications    Scheduled Meds: . carvedilol  12.5 mg Oral BID WC  . clopidogrel  75 mg Oral Daily  . colchicine  0.6 mg Oral BID  . enoxaparin (LOVENOX) injection  1 mg/kg Subcutaneous Q12H  . fluticasone furoate-vilanterol  1 puff Inhalation Daily   And  . umeclidinium bromide  1 puff Inhalation Daily  . furosemide  40 mg Intravenous BID  . irbesartan  150 mg Oral Daily  . pantoprazole  40 mg Oral Daily  . potassium chloride  40 mEq Oral BID  . sodium chloride flush  3 mL Intravenous Q12H  . sodium chloride flush  3 mL Intravenous Q12H   Continuous Infusions: . sodium chloride    . sodium chloride    . sodium chloride 10 mL/hr at 09/22/17 0614   PRN Meds: sodium chloride, sodium chloride, acetaminophen, albuterol, ALPRAZolam, guaiFENesin-dextromethorphan, nitroGLYCERIN, ondansetron (ZOFRAN) IV, sodium chloride flush, sodium chloride flush, zolpidem   Vital Signs    Vitals:   09/22/17 0540 09/22/17 0833 09/22/17 0842 09/22/17 1011  BP: 104/63 98/68  (!) 90/58  Pulse: 72 75 75   Resp: 18  18   Temp: 98.3 F (36.8 C)     TempSrc: Oral     SpO2: 98% 93% 93%   Weight: 220 lb 6.4 oz (100 kg)     Height:        Intake/Output Summary (Last 24 hours) at 09/22/2017 1014 Last data filed at 09/22/2017 0840 Gross per 24 hour  Intake 487.5 ml  Output 350 ml  Net 137.5 ml   Filed Weights   09/20/17 0600 09/21/17 0429 09/22/17 0540  Weight: 222 lb 9.6 oz (101 kg) 204 lb 12.8 oz (92.9 kg) 220 lb 6.4 oz (100 kg)    Telemetry    NSR without significant ventricular ectopy - Personally Reviewed  ECG    NSR with TWI in lateral leads - Personally Reviewed  Physical Exam   GEN: No acute distress.   Neck: No JVD Cardiac: RRR, no murmurs, rubs, or gallops.  Respiratory: Clear to  auscultation bilaterally. GI: Soft, nontender, non-distended  MS: No edema; No deformity. Neuro:  Nonfocal  Psych: Normal affect   Labs    Chemistry Recent Labs  Lab 09/16/17 2222  09/18/17 0018 09/19/17 0346 09/22/17 0458  NA  --    < > 135 137 139  K  --    < > 4.0 4.4 4.4  CL  --    < > 101 104 106  CO2  --    < > 22 22 20*  GLUCOSE  --    < > 93 96 107*  BUN  --    < > 21* 16 17  CREATININE  --    < > 1.02* 0.88 1.16*  CALCIUM  --    < > 9.1 9.4 9.4  PROT 8.2*  --   --   --   --   ALBUMIN 3.9  --   --   --   --   AST 43*  --   --   --   --   ALT 21  --   --   --   --   ALKPHOS 148*  --   --   --   --   BILITOT 1.0  --   --   --   --  GFRNONAA  --    < > 59* >60 50*  GFRAA  --    < > >60 >60 59*  ANIONGAP  --    < > 12 11 13   < > = values in this interval not displayed.     Hematology Recent Labs  Lab 09/16/17 0800 09/21/17 0556 09/22/17 0412  WBC 4.9 6.7 6.4  RBC 5.36* 4.76 4.97  HGB 13.6 11.9* 13.0  HCT 44.4 40.1 41.8  MCV 82.8 84.2 84.1  MCH 25.4* 25.0* 26.2  MCHC 30.6 29.7* 31.1  RDW 17.3* 18.5* 18.3*  PLT 208 239 244    Cardiac Enzymes Recent Labs  Lab 09/17/17 1832 09/18/17 0018  TROPONINI <0.03 <0.03    Recent Labs  Lab 09/16/17 0819  TROPIPOC 0.00     BNP Recent Labs  Lab 09/16/17 0800  BNP 668.9*     DDimer No results for input(s): DDIMER in the last 168 hours.   Radiology    No results found.  Cardiac Studies   Conclusion     Prox Cx to Mid Cx lesion is 35% stenosed.  Prox RCA to Mid RCA lesion is 100% stenosed.  SVG-RCA: Prox Graft lesion is 50% stenosed.  Prox LAD lesion is 50% stenosed. Prox LAD to Mid LAD lesion is 75% stenosed at 2nd Diag. There is competitive flow from the LIMA graft distally.  Ost 1st Diag lesion is 70% stenosed. mid 1st Diag 70%  LIMA-dLAD was very difficult to engage via femoral access. With nonselective angiography of the graft is widely patent to the very distal LAD. Competitive  flow noted back to the second diagonal branch.  Ost 3rd Mrg-1 lesion is 100% stenosed. -Flush occlusion from the native circumflex  Ost 3rd Mrg-2 lesion is 85% stenosed at the anastomosis site of the SVG  Mid Cx to Dist Cx lesion is 80% stenosed.  Dist Cx lesion is 75% stenosed. Just prior to insertion of second limb of SVG-OM3-dCx  SVG-OM3-dCx mid Graft to Dist Graft lesion before Ost 3rd Mrg is 80% stenosed -prior to insertion on 3rd Mrg  Hemodynamic findings consistent with moderate pulmonary hypertension. With moderately elevated LVEDP and PCWP.  Mild reduction in cardiac output by Fick despite normal EF by Echocardiogram   Patient has severe native coronary disease, relatively stable from previous catheterization.   The RCA is now 100% occluded, the LAD distally has competitive flow from the LIMA. Both the vein grafts have tubular narrowings in the more proximal segment of the RCA graft is only 50% whereas the sequential OM-dCx graft has a more significant 80% stenosis.  There is a severe anastomotic lesion of the vein graft to OM 3.  Moderate to severe pulmonary hypertension with only mild to moderately elevated filling pressures.  Likely primary pulmonary potential component.     Patient Profile     61 y.o. female with a history of CAD s/p CABG 05/2016 w/ LIMA-LAD, SVG-OM-CFX, SVG-RCA, CHF, COPD on home oxygen, hyperlipidemia, hypertension, fibromyalgia, GERD, PAF formerly on amiodarone now on Eliquis, remote cocaine useand lupus who presented with chest pain since yesterday and also recent weight gain, DOE and bilateral lower extremity swelling. Mild CHF on CXR, negative troponin, BNP 668.9. Negative for PE.    Assessment & Plan    1. Acute on chronic diastolic heart failure  - appears to have reach euvolemic level on physical exam. Will transition to torsemide 40mg BID, previously lasix 80mg AM and 40 mg PM.   2. CAD   s/p CABG x 4 05/2016:   - Cath 09/20/2017 showed graft  lesion and SVG to OM/dLCx as well as anastomatic lesion and OM3. Loaded on plavix on 09/20/2017. Not on ASA given the need for eliquis. Plan PCI today. Continue to hold eliquis, plan to restart post cath  3. PAF on eliquis: eliquis currently being held pending PCI on 09/22/2017.  4. Severe pulmonary hypertension: seen by HF service yesterday  5. Hypokalemia: resolved.   For questions or updates, please contact [place] Please consult [email or web address] for contact info under Cardiology/STEMI.   SignedAlmyra Deforest, PA  09/22/2017, 10:14 AM    The patient was seen, examined and discussed with Almyra Deforest, PA-C and I agree with the above.   Plan is to obtain repeat echo today, then cath for 2 potential culprit lesions are the graft lesion and SVG-OM-dCx as well as the anastomotic lesion and OM 3. Per Dr Ellyn Hack stenting the graft would be relatively straightforward, however intervention on the anastomotic/ostial lesion in the OM 3 would be difficult and potentially would only be able to be done by balloon angioplasty. She was loaded with Plavix 300 mg on 11/5, now on 75 mg daily.  We will continue iv diuresis, negative fluid balance, Crea 0.9->1.1, we will follow post cath.  BP soft. We will arrange for a sleep study and cardiopulmonary rehab at discharge.  Anticipated discharge tomorrow.   Ena Dawley, MD 09/22/2017

## 2017-09-22 NOTE — Progress Notes (Signed)
Progress Note  Patient Name: Elizabeth [name] Date of Encounter: 09/22/2017  Primary Cardiologist: Dr. Sallyanne Kuster  Subjective   Breathing well, still not at baseline  Inpatient Medications    Scheduled Meds: . carvedilol  12.5 mg Oral BID WC  . clopidogrel  75 mg Oral Daily  . colchicine  0.6 mg Oral BID  . enoxaparin (LOVENOX) injection  1 mg/kg Subcutaneous Q12H  . fluticasone furoate-vilanterol  1 puff Inhalation Daily   And  . umeclidinium bromide  1 puff Inhalation Daily  . furosemide  40 mg Intravenous BID  . irbesartan  150 mg Oral Daily  . pantoprazole  40 mg Oral Daily  . potassium chloride  40 mEq Oral BID  . sodium chloride flush  3 mL Intravenous Q12H  . sodium chloride flush  3 mL Intravenous Q12H   Continuous Infusions: . sodium chloride    . sodium chloride    . sodium chloride 10 mL/hr at 09/22/17 0614   PRN Meds: sodium chloride, sodium chloride, acetaminophen, albuterol, ALPRAZolam, guaiFENesin-dextromethorphan, nitroGLYCERIN, ondansetron (ZOFRAN) IV, sodium chloride flush, sodium chloride flush, zolpidem   Vital Signs    Vitals:   09/22/17 0540 09/22/17 0833 09/22/17 0842 09/22/17 1011  BP: 104/63 98/68  (!) 90/58  Pulse: 72 75 75   Resp: 18  18   Temp: 98.3 F (36.8 C)     TempSrc: Oral     SpO2: 98% 93% 93%   Weight: 220 lb 6.4 oz (100 kg)     Height:        Intake/Output Summary (Last 24 hours) at 09/22/2017 1014 Last data filed at 09/22/2017 0840 Gross per 24 hour  Intake 487.5 ml  Output 350 ml  Net 137.5 ml   Filed Weights   09/20/17 0600 09/21/17 0429 09/22/17 0540  Weight: 222 lb 9.6 oz (101 kg) 204 lb 12.8 oz (92.9 kg) 220 lb 6.4 oz (100 kg)    Telemetry    NSR without significant ventricular ectopy - Personally Reviewed  ECG    NSR with TWI in lateral leads - Personally Reviewed  Physical Exam   GEN: No acute distress.   Neck: No JVD Cardiac: RRR, no murmurs, rubs, or gallops.  Respiratory: Clear to  auscultation bilaterally. GI: Soft, nontender, non-distended  MS: No edema; No deformity. Neuro:  Nonfocal  Psych: Normal affect   Labs    Chemistry Recent Labs  Lab 09/16/17 2222  09/18/17 0018 09/19/17 0346 09/22/17 0458  NA  --    < > 135 137 139  K  --    < > 4.0 4.4 4.4  CL  --    < > 101 104 106  CO2  --    < > 22 22 20*  GLUCOSE  --    < > 93 96 107*  BUN  --    < > 21* 16 17  CREATININE  --    < > 1.02* 0.88 1.16*  CALCIUM  --    < > 9.1 9.4 9.4  PROT 8.2*  --   --   --   --   ALBUMIN 3.9  --   --   --   --   AST 43*  --   --   --   --   ALT 21  --   --   --   --   ALKPHOS 148*  --   --   --   --   BILITOT 1.0  --   --   --   --  GFRNONAA  --    < > 59* >60 50*  GFRAA  --    < > >60 >60 59*  ANIONGAP  --    < > _0 < > = values in this interval not displayed.     Hematology Recent Labs  Lab 09/16/17 0800 09/21/17 0556 09/22/17 0412  WBC 4.9 6.7 6.4  RBC 5.36* 4.76 4.97  HGB 13.6 11.9* 13.0  HCT 44.4 40.1 41.8  MCV 82.8 84.2 84.1  MCH 25.4* 25.0* 26.2  MCHC 30.6 29.7* 31.1  RDW 17.3* 18.5* 18.3*  PLT 208 239 244    Cardiac Enzymes Recent Labs  Lab 09/17/17 1832 09/18/17 0018  TROPONINI <0.03 <0.03    Recent Labs  Lab 09/16/17 0819  TROPIPOC 0.00     BNP Recent Labs  Lab 09/16/17 0800  BNP 668.9*     DDimer No results for input(s): DDIMER in the last 168 hours.   Radiology    No results found.  Cardiac Studies   Conclusion     Prox Cx to Mid Cx lesion is 35% stenosed.  Prox RCA to Mid RCA lesion is 100% stenosed.  SVG-RCA: Prox Graft lesion is 50% stenosed.  Prox LAD lesion is 50% stenosed. Prox LAD to Mid LAD lesion is 75% stenosed at 2nd Diag. There is competitive flow from the LIMA graft distally.  Ost 1st Diag lesion is 70% stenosed. mid 1st Diag 70%  LIMA-dLAD was very difficult to engage via femoral access. With nonselective angiography of the graft is widely patent to the very distal LAD. Competitive  flow noted back to the second diagonal branch.  Ost 3rd Mrg-1 lesion is 100% stenosed. -Flush occlusion from the native circumflex  Ost 3rd Mrg-2 lesion is 85% stenosed at the anastomosis site of the SVG  Mid Cx to Dist Cx lesion is 80% stenosed.  Dist Cx lesion is 75% stenosed. Just prior to insertion of second limb of SVG-OM3-dCx  SVG-OM3-dCx mid Graft to Dist Graft lesion before Ost 3rd Mrg is 80% stenosed -prior to insertion on 3rd Mrg  Hemodynamic findings consistent with moderate pulmonary hypertension. With moderately elevated LVEDP and PCWP.  Mild reduction in cardiac output by Fick despite normal EF by Echocardiogram   Patient has severe native coronary disease, relatively stable from previous catheterization.   The RCA is now 100% occluded, the LAD distally has competitive flow from the LIMA. Both the vein grafts have tubular narrowings in the more proximal segment of the RCA graft is only 50% whereas the sequential OM-dCx graft has a more significant 80% stenosis.  There is a severe anastomotic lesion of the vein graft to OM 3.  Moderate to severe pulmonary hypertension with only mild to moderately elevated filling pressures.  Likely primary pulmonary potential component.     Patient Profile     61 y.o. female with a history of CAD s/p CABG 05/2016 w/ LIMA-LAD, SVG-OM-CFX, SVG-RCA, CHF, COPD on home oxygen, hyperlipidemia, hypertension, fibromyalgia, GERD, PAF formerly on amiodarone now on Eliquis, remote cocaine useand lupus who presented with chest pain since yesterday and also recent weight gain, DOE and bilateral lower extremity swelling. Mild CHF on CXR, negative troponin, BNP 668.9. Negative for PE.    Assessment & Plan    1. Acute on chronic diastolic heart failure  - appears to have reach euvolemic level on physical exam. Will transition to torsemide 74m BID, previously lasix 870mAM and 40 mg PM.   2. CAD  s/p CABG x 4 05/2016:   - Cath 09/20/2017 showed graft  lesion and SVG to OM/dLCx as well as anastomatic lesion and OM3. Loaded on plavix on 09/20/2017. Not on ASA given the need for eliquis. Plan PCI today. Continue to hold eliquis, plan to restart post cath  3. PAF on eliquis: eliquis currently being held pending PCI on 09/22/2017.  4. Severe pulmonary hypertension: seen by HF service yesterday  5. Hypokalemia: resolved.   For questions or updates, please contact [place] Please consult [email or web address] for contact info under Cardiology/STEMI.   SignedAlmyra Deforest, PA  09/22/2017, 10:14 AM    The patient was seen, examined and discussed with Almyra Deforest, PA-C and I agree with the above.   Plan is to obtain repeat echo today, then cath for 2 potential culprit lesions are the graft lesion and SVG-OM-dCx as well as the anastomotic lesion and OM 3. Per Dr Ellyn Hack stenting the graft would be relatively straightforward, however intervention on the anastomotic/ostial lesion in the OM 3 would be difficult and potentially would only be able to be done by balloon angioplasty. She was loaded with Plavix 300 mg on 11/5, now on 75 mg daily.  We will continue iv diuresis, negative fluid balance, Crea 0.9->1.1, we will follow post cath.  BP soft. We will arrange for a sleep study and cardiopulmonary rehab at discharge.  Anticipated discharge tomorrow.   Ena Dawley, MD 09/22/2017

## 2017-09-22 NOTE — Progress Notes (Signed)
Site area: right groin  Site Prior to Removal:  Level 0  Pressure Applied For 20 MINUTES    Minutes Beginning at [place]:   Yes.    Patient Status During Pull:  AAO X 4  Post Pull Groin Site:  Level 0  Post Pull Instructions Given:  Yes.    Post Pull Pulses Present:  Yes.    Dressing Applied:  Yes.    Comments:  Tolerated procedure well

## 2017-09-22 NOTE — Progress Notes (Signed)
Notified cardiology team of patient's weight gain.

## 2017-09-22 NOTE — Progress Notes (Signed)
PT Cancellation Note  Patient Details Name: Elizabeth [name] MRN: [id number] DOB: [birth year]   Cancelled Treatment:    Reason Eval/Treat Not Completed: (P) Other (comment)(Pt off unit for cath lab procedure to place stent.  )   Jeriah Skufca [name] 09/22/2017, 3:11 PM  [place]or [place], PTA pager [phone]

## 2017-09-22 NOTE — Interval H&P Note (Signed)
History and Physical Interval Note:  09/22/2017 2:44 PM  Elizabeth [name]  has presented today for surgery, with the diagnosis of cad  The various methods of treatment have been discussed with the patient and family. After consideration of risks, benefits and other options for treatment, the patient has consented to  Procedure(s): CORONARY STENT INTERVENTION (N/A) as a surgical intervention .  The patient's history has been reviewed, patient examined, no change in status, stable for surgery.  I have reviewed the patient's chart and labs.  Questions were answered to the patient's satisfaction.   Cath Lab Visit (complete for each Cath Lab visit)  Clinical Evaluation Leading to the Procedure:   ACS: Yes.    Non-ACS:    Anginal Classification: CCS III  Anti-ischemic medical therapy: Minimal Therapy (1 class of medications)  Non-Invasive Test Results: No non-invasive testing performed  Prior CABG: Previous CABG        [facility] 09/22/2017 2:45 PM

## 2017-09-23 ENCOUNTER — Encounter (HOSPITAL_COMMUNITY): Payer: Self-pay | Admitting: Cardiology

## 2017-09-23 ENCOUNTER — Other Ambulatory Visit: Payer: Self-pay | Admitting: Physician Assistant

## 2017-09-23 DIAGNOSIS — Z955 Presence of coronary angioplasty implant and graft: Secondary | ICD-10-CM

## 2017-09-23 DIAGNOSIS — G4730 Sleep apnea, unspecified: Secondary | ICD-10-CM

## 2017-09-23 LAB — BASIC METABOLIC PANEL
Anion gap: 10 (ref 5–15)
BUN: 21 mg/dL — AB (ref 6–20)
CHLORIDE: 106 mmol/L (ref 101–111)
CO2: 20 mmol/L — ABNORMAL LOW (ref 22–32)
Calcium: 9.3 mg/dL (ref 8.9–10.3)
Creatinine, Ser: 1.32 mg/dL — ABNORMAL HIGH (ref 0.44–1.00)
GFR calc Af Amer: 50 mL/min — ABNORMAL LOW (ref >60)
GFR calc non Af Amer: 43 mL/min — ABNORMAL LOW (ref >60)
GLUCOSE: 99 mg/dL (ref 65–99)
POTASSIUM: 4.9 mmol/L (ref 3.5–5.1)
Sodium: 136 mmol/L (ref 135–145)

## 2017-09-23 LAB — CBC
HEMATOCRIT: 38.8 % (ref 36.0–46.0)
Hemoglobin: 12.1 g/dL (ref 12.0–15.0)
MCH: 26.1 pg (ref 26.0–34.0)
MCHC: 31.2 g/dL (ref 30.0–36.0)
MCV: 83.8 fL (ref 78.0–100.0)
Platelets: 232 10*3/uL (ref 150–400)
RBC: 4.63 MIL/uL (ref 3.87–5.11)
RDW: 18.2 % — AB (ref 11.5–15.5)
WBC: 6.1 10*3/uL (ref 4.0–10.5)

## 2017-09-23 MED ORDER — ASPIRIN 81 MG PO CHEW: 81.0000 mg | CHEWABLE_TABLET | Freq: Every day | ORAL | 30 refills | Status: DC

## 2017-09-23 MED ORDER — CLOPIDOGREL BISULFATE 75 MG PO TABS: 75.0000 mg | ORAL_TABLET | Freq: Every day | ORAL | 11 refills | Status: AC

## 2017-09-23 MED ORDER — TORSEMIDE 20 MG PO TABS: 40.0000 mg | ORAL_TABLET | Freq: Two times a day (BID) | ORAL | 6 refills | Status: DC

## 2017-09-23 NOTE — Progress Notes (Signed)
CARDIAC REHAB PHASE I   PRE:  Rate/Rhythm: 71 SR    BP: sitting 113/73    SaO2: 93 4L, 83 4L after bathroom  MODE:  Ambulation: 250 ft   POST:  Rate/Rhythm: 92 SR    BP: sitting 97/74     SaO2: 83 4L, 86-87 6L  Pt to bathroom and sink on 4L, significant DOE, SAO2 83 4L. Rest on EOB for 5 min while SaO2 increased to 97 4L. Pt wanted to ambulate on 4L. Used RW, slow pace. Encouraged pursed lip breathing. Rest after 80 ft and SaO2 89 4L. However SAO2 began decreasing at this point and after 170 ft SAO2 83 4L and I increased to 6L. SaO2 86-87 6L after 250 ft. Pt is impulsive at times, gets frustrated. Gave her reminders for pursed lip breathing and energy conservation. She sts she normally goes to [facility] at home without O2 for convenience. She was in the process on starting Pulmonary Rehab but will now need to do CRPII first. Will send referral.  We discussed stent, Plavix/Eliquis/ASA, restrictions, low sodium diet (I also gave her handouts on eating out), daily wts, NTG, and CRPII. I gave her the HF booklet and discussed signs and sx of HF. She is planning on buying a scale today at [facility]. Her daughter is with her and very supportive. She is understanding somewhat of fluid management but gets frustrated easily. Will need continued education. I did not give ex gl due to significant desaturation. Needs CRPII where she can be sitting. [place], ACSM 09/23/2017 9:16 AM

## 2017-09-23 NOTE — Progress Notes (Signed)
Thanks, [employer]

## 2017-09-23 NOTE — Discharge Summary (Signed)
Discharge Summary    Patient ID: Elizabeth [name],  MRN: [id number], DOB/AGE: 1956-09-21 61 y.o.  Admit date: 09/16/2017 Discharge date: 09/23/2017  Primary Care Provider: Ledell Noss Primary Cardiologist: Dr Sallyanne Kuster  Discharge Diagnoses    Principal Problem:   Acute on chronic diastolic CHF (congestive heart failure) ([place]) Active Problems:   SOB (shortness of breath)   Coronary artery disease involving native coronary artery of native heart with angina pectoris (HCC)   Severe pulmonary arterial systolic hypertension (HCC)   Hx of CABG   Status post coronary artery stent placement   Allergies No Known Allergies  Diagnostic Studies/Procedures    PCI: 09/22/2017  Ost 3rd Mrg-2 lesion is 85% stenosed.  Post intervention, there is a 30% residual stenosis.  Mid Graft to Dist Graft lesion before Ost 3rd Mrg is 80% stenosed.  A drug-eluting stent was successfully placed.  Using a BALLOON EUPHORA RX 2.0X12.  Post intervention, there is a 0% residual stenosis.  A stent was successfully placed.  1. Successful POBA of anastomotic lesion in OM distal to SVG insertion 2. Successful stenting of the body of SVG to OM with DES Plan: DAPT for one month then discontinue ASA. May resume Eliquis tomorrow if no bleeding complication.  ECHO: 09/22/2017 - Left ventricle: The cavity size was normal. There was mild   concentric hypertrophy. Systolic function was mildly to   moderately reduced. The estimated ejection fraction was in the   range of 40% to 45%. Hypokinesis of the anteroseptal, inferior,   and inferoseptal myocardium. Doppler parameters are consistent   with abnormal left ventricular relaxation (grade 1 diastolic   dysfunction). Doppler parameters are consistent with   indeterminate ventricular filling pressure. - Aortic valve: Transvalvular velocity was within the normal range.   There was no stenosis. There was no regurgitation. - Mitral valve: Transvalvular  velocity was within the normal range.   There was no evidence for stenosis. There was no regurgitation. - Left atrium: The atrium was mildly dilated. - Right ventricle: The cavity size was mildly dilated. Wall   thickness was normal. Systolic function was normal. - Right atrium: The atrium was severely dilated. - Tricuspid valve: There was mild regurgitation. - Pulmonary arteries: Systolic pressure was mildly increased. PA   peak pressure: 46 mm Hg (S).  Cardiac Cath 09/20/2017 Conclusion    Prox Cx to Mid Cx lesion is 35% stenosed.  Prox RCA to Mid RCA lesion is 100% stenosed.  SVG-RCA: Prox Graft lesion is 50% stenosed.  Prox LAD lesion is 50% stenosed. Prox LAD to Mid LAD lesion is 75% stenosed at 2nd Diag. There is competitive flow from the LIMA graft distally.  Ost 1st Diag lesion is 70% stenosed. mid 1st Diag 70%  LIMA-dLAD was very difficult to engage via femoral access. With nonselective angiography of the graft is widely patent to the very distal LAD. Competitive flow noted back to the second diagonal branch.  Ost 3rd Mrg-1 lesion is 100% stenosed. -Flush occlusion from the native circumflex  Ost 3rd Mrg-2 lesion is 85% stenosed at the anastomosis site of the SVG  Mid Cx to Dist Cx lesion is 80% stenosed.  Dist Cx lesion is 75% stenosed. Just prior to insertion of second limb of SVG-OM3-dCx  SVG-OM3-dCx mid Graft to Dist Graft lesion before Ost 3rd Mrg is 80% stenosed -prior to insertion on 3rd Mrg  Hemodynamic findings consistent with moderate pulmonary hypertension. With moderately elevated LVEDP and PCWP.  Mild reduction in cardiac  output by Fick despite normal EF by Echocardiogram  Patient has severe native coronary disease, relatively stable from previous catheterization.  The RCA is now 100% occluded, the LAD distally has competitive flow from the LIMA. Both the vein grafts have tubular narrowings in the more proximal segment of the RCA graft is only 50%  whereas the sequential OM-dCx graft has a more significant 80% stenosis. There is a severe anastomotic lesion of the vein graft to OM 3.  Moderate to severe pulmonary hypertension with only mild to moderately elevated filling pressures. Likely primary pulmonary potential component.   _____________   History of Present Illness     61 y.o. female with a history of CAD s/p CABG 05/2016 w/ LIMA-LAD, SVG-OM-CFX, SVG-RCA, CHF, COPD on home oxygen, hyperlipidemia, hypertension, fibromyalgia, GERD, PAF formerly on amiodarone now on Eliquis, remote cocaine useand lupus who presented 11/01 with chest pain since yesterday and also recent weight gain, DOE and bilateral lower extremity swelling. Mild CHF on CXR, negative troponin, BNP 668.9. Negative for PE.   Hospital Course     Consultants: Dr. Pearline Cables and Dr. Lamonte Sakai with Pulmonary, Dr. Haroldine Laws with CHF is  1.  Acute on chronic diastolic CHF: An echocardiogram was performed which showed her EF decreased to 40-45%.  She was diuresed with IV  Lasix, discharge weight 219 pounds.  Her volume status is felt to be at baseline.  She was seen by the CHF team and was found to be eating a [employer] meal containing almost 2000 mg of sodium.  She needs dietary compliance.  She will be encouraged to do daily weights as an outpatient and maintain a 2000 mg/day sodium diet.  2.  Chest pain: Cardiac enzymes were only minimally elevated, not consistent with an MI.  She was taken to the Cath Lab on 09/20/2017.  She needed a drug-eluting stent and this was placed.  She is to be on dual antiplatelet therapy for a month, then discontinue aspirin.  3.  PAF: She was maintaining sinus rhythm on carvedilol.  She was restarted on Eliquis once the heart catheterization and PCI were performed.  4.  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  They are going to arrange follow-up so she can get a sleep study.  Vasodilator therapy was not felt indicated at this time.  She is to be discharged on her home Trelegy, oxygen, and bronchodilators.  Her ANA was positive but her sed rate was normal and she was rheumatoid negative.  Weight loss will be encouraged.  No further workup needed at this time.  On 09/23/2017, Elizabeth [name] was seen by Dr. Meda Coffee and all data were reviewed.  She was recovering well after the heart catheterization.  She was breathing well.  No further inpatient workup was indicated and she is considered stable for discharge, to follow up as an outpatient. _____________  Discharge Vitals Blood pressure 97/69, pulse 73, temperature 98.1 F (36.7 C), temperature source Oral, resp. rate (!) 22, height _0  (1.575 m), weight 219 lb 12.8 oz (99.7 kg), last menstrual period 12/30/1994, SpO2 95 %.  Filed Weights   09/21/17 0429 09/22/17 0540 09/23/17 0345  Weight: 204 lb 12.8 oz (92.9 kg) 220 lb 6.4 oz (100 kg) 219 lb 12.8 oz (99.7 kg)    Labs & Radiologic Studies    CBC Recent  Labs    09/22/17 0412 09/23/17 0341  WBC 6.4 6.1  HGB 13.0 12.1  HCT 41.8 38.8  MCV 84.1 83.8  PLT 244 532   Basic Metabolic Panel Recent Labs    09/22/17 0458 09/23/17 0341  NA 139 136  K 4.4 4.9  CL 106 106  CO2 20* 20*  GLUCOSE 107* 99  BUN 17 21*  CREATININE 1.16* 1.32*  CALCIUM 9.4 9.3   Liver Function Tests Hepatic Function Panel     Component Value Date/Time   PROT 8.2 (H) 09/16/2017 2222   ALBUMIN 3.9 09/16/2017 2222   AST 43 (H) 09/16/2017 2222   ALT 21 09/16/2017 2222   ALKPHOS 148 (H) 09/16/2017 2222   BILITOT 1.0 09/16/2017 2222   BILIDIR 0.2 09/16/2017 2222   IBILI 0.8 09/16/2017 2222   Cardiac Enzymes Troponin I  Date Value Ref Range Status  09/18/2017 <0.03 <0.03 ng/mL Final  09/17/2017 <0.03 <0.03 ng/mL Final   BNP BNP    Component Value Date/Time   BNP 668.9 (H) 09/16/2017 0800   BNP  500.0 (H) 03/09/2017 1148   _____________  Dg Chest 2 View  Result Date: 09/16/2017 CLINICAL DATA:  Left-sided chest pain.  Shortness of breath. EXAM: CHEST  2 VIEW COMPARISON:  08/06/2017 . FINDINGS: Prior CABG. Cardiomegaly with mild bilateral interstitial prominence. Mild CHF cannot be excluded. Mild pneumonitis cannot be excluded. Prominent pleural effusion. No pneumothorax. IMPRESSION: Prior CABG with diffuse mild bilateral interstitial prominence suggesting mild CHF. Mild pneumonitis cannot be excluded . Electronically Signed   By: Marcello Moores  Register   On: 09/16/2017 09:03   Ct Angio Chest/abd/pel For Dissection W And/or Wo Contrast Result Date: 09/16/2017 CLINICAL DATA:  Chest pain radiating into the left shoulder for several hours, initial encounter EXAM: CT ANGIOGRAPHY CHEST, ABDOMEN AND PELVIS TECHNIQUE: Multidetector CT imaging through the chest, abdomen and pelvis was performed using the standard protocol during bolus administration of intravenous contrast. Multiplanar reconstructed images and MIPs were obtained and reviewed to evaluate the vascular anatomy. CONTRAST:  100 mL Isovue 370. COMPARISON:  05/31/2017, 03/11/2017 FINDINGS: CTA CHEST FINDINGS Cardiovascular: Thoracic aorta and its branches demonstrate mild atherosclerotic calcifications. Changes of prior coronary bypass grafting are noted. Coronary calcifications are seen. No aneurysmal dilatation of the aorta is seen. No dissection is noted. The pulmonary artery is well visualize with a normal branching pattern. No findings to suggest pulmonary embolism are identified. Mild but stable cardiac enlargement is noted. Mediastinum/Nodes: Thoracic inlet demonstrates a hypodense nodule in the left lobe of the thyroid measuring approximately 14 mm. It is somewhat better visualized on the current study. Scattered small hilar and mediastinal lymph nodes are noted but improved when compared with the prior study. The esophagus is within normal  limits. Lungs/Pleura: Lungs are well aerated bilaterally. Diffuse emphysematous changes are seen. Scattered pulmonary nodules are identified. The most prominent of these lies in the right middle lobe. These are stable from prior exam. No focal infiltrate or sizable effusion is noted. Musculoskeletal: Mild degenerative changes of the thoracic spine are noted. No acute bony abnormality is seen. Review of the MIP images confirms the above findings. CTA ABDOMEN AND PELVIS FINDINGS VASCULAR Aorta: Atherosclerotic calcifications of the aorta are noted without aneurysmal dilatation. Normal aortic bifurcation is seen. Celiac: Mild stenosis is noted at the origin of the celiac axis. SMA: Patent without evidence of aneurysm, dissection, vasculitis or significant stenosis. Renals: Single renal arteries are identified bilaterally. No focal stenosis is noted. IMA: Within normal limits. Iliacs :  Mild atherosclerotic disease is noted without aneurysmal dilatation or focal stenosis. Veins: No definitive venous abnormality is noted although opacification is limited. Review of the MIP images confirms the above findings. NON-VASCULAR Hepatobiliary: Mild fatty infiltration of the liver is noted. The gallbladder is within normal limits. Pancreas: Unremarkable. No pancreatic ductal dilatation or surrounding inflammatory changes. Spleen: Normal in size without focal abnormality. Adrenals/Urinary Tract: Adrenal glands are unremarkable. Kidneys are normal, without renal calculi, focal lesion, or hydronephrosis. Bladder is unremarkable. Stomach/Bowel: Mild diverticular change of the colon is noted without evidence of diverticulitis. No obstructive or inflammatory changes are seen. The appendix is within normal limits and air filled. Lymphatic: No specific lymphadenopathy is noted. Reproductive: Status post hysterectomy. No adnexal masses. Other: No abdominal wall hernia or abnormality. No abdominopelvic ascites. Musculoskeletal: Degenerative  changes of lumbar spine are noted. No acute bony abnormality is seen. Review of the MIP images confirms the above findings. IMPRESSION: Atherosclerotic changes without evidence of aneurysmal dilatation or dissection. Mild celiac axis stenosis. Left thyroid nodule stable from previous exams. The need for nonemergent workup can be determined on a clinical basis. Scattered benign-appearing pulmonary nodule stable from multiple previous exams. Scattered chronic changes within the abdomen. No acute abnormality is noted. Electronically Signed   By: Inez Catalina M.D.   On: 09/16/2017 10:17   Disposition   Pt is being discharged home today in good condition.  Follow-up Plans & Appointments    Follow-up Information    Barrett, Evelene Croon, PA-C Follow up on 09/29/2017.   Specialties:  Cardiology, Radiology Why:  at 9:30 for cardiology hospital follow up.  Contact information: [address] [place] [address] [phone]        Melvenia Needles, NP Follow up on 09/27/2017.   Specialty:  Pulmonary Disease Why:  at 10:45am. Please arrive at 10:30am.  Contact information: [address] [address] [phone]          Discharge Instructions    (HEART FAILURE PATIENTS) Call MD:  Anytime you have any of the following symptoms: 1) 3 pound weight gain in 24 hours or 5 pounds in 1 week 2) shortness of breath, with or without a dry hacking cough 3) swelling in the hands, feet or stomach 4) if you have to sleep on extra pillows at night in order to breathe.   Complete by:  As directed    AMB Referral to [facility]   Complete by:  As directed    Reason for consult:  Post hospital follow up - restart of services   Diagnoses of:   Heart Failure COPD/ Pneumonia     Expected date of contact:  1-3 days (reserved for hospital discharges)   [facility] services.  Post hospital follow up.   Amb Referral to Cardiac Rehabilitation   Complete  by:  As directed    Diagnosis:   Coronary Stents PTCA     Diet - low sodium heart healthy   Complete by:  As directed    Increase activity slowly   Complete by:  As directed       Discharge Medications   Current Discharge Medication List    START taking these medications   Details  aspirin 81 MG chewable tablet Chew 1 tablet (81 mg total) daily by mouth. Qty: 1 tablet, Refills: 30    clopidogrel (PLAVIX) 75 MG tablet Take 1 tablet (75 mg total) daily by mouth. Qty: 30 tablet, Refills: 11  nitroGLYCERIN (NITROSTAT) 0.4 MG SL tablet Place 1 tablet (0.4 mg total) under the tongue every 5 (five) minutes x 3 doses as needed for chest pain. Qty: 25 tablet, Refills: 12    potassium chloride SA (K-DUR,KLOR-CON) 20 MEQ tablet Take 1 tablet (20 mEq total) by mouth daily. Qty: 30 tablet, Refills: 4      CONTINUE these medications which have CHANGED   Details  torsemide (DEMADEX) 20 MG tablet Take 2 tablets (40 mg total) 2 (two) times daily by mouth. OK to take an extra tablet daily as needed for weight gain or edema. Qty: 190 tablet, Refills: 6      CONTINUE these medications which have NOT CHANGED   Details  acetaminophen (TYLENOL) 500 MG tablet Take 500 mg by mouth every 6 (six) hours as needed for mild pain.    albuterol (PROVENTIL HFA;VENTOLIN HFA) 108 (90 Base) MCG/ACT inhaler Inhale 2 puffs into the lungs every 6 (six) hours as needed for wheezing or shortness of breath. Qty: 1 Inhaler, Refills: 2    albuterol (PROVENTIL) (2.5 MG/3ML) 0.083% nebulizer solution Take 3 mLs (2.5 mg total) by nebulization every 6 (six) hours as needed for wheezing or shortness of breath. Qty: 120 vial, Refills: 5    apixaban (ELIQUIS) 5 MG TABS tablet Take 1 tablet (5 mg total) by mouth 2 (two) times daily. Qty: 60 tablet, Refills: 4    carvedilol (COREG) 12.5 MG tablet TAKE 1 TABLET(12.5 MG) BY MOUTH TWICE DAILY Qty: 180 tablet, Refills: 3   Associated Diagnoses: Essential hypertension      colchicine 0.6 MG tablet Take 1 tablet (0.6 mg total) by mouth 2 (two) times daily. Qty: 30 tablet, Refills: 0    Fluticasone-Umeclidin-Vilant (TRELEGY ELLIPTA) 100-62.5-25 MCG/INH AEPB Inhale 1 puff into the lungs daily. Qty: 1 each, Refills: 5    pantoprazole (PROTONIX) 40 MG tablet TAKE 1 TABLET(40 MG) BY MOUTH DAILY Qty: 90 tablet, Refills: 3    valsartan (DIOVAN) 320 MG tablet Take 320 mg by mouth daily. Refills: 11    rosuvastatin (CRESTOR) 20 MG tablet Take 1 tablet (20 mg total) by mouth at bedtime. Qty: 90 tablet, Refills: 3      STOP taking these medications     furosemide (LASIX) 40 MG tablet           Outstanding Labs/Studies   None  Duration of Discharge Encounter   Greater than 30 minutes including physician time.  Jonetta Speak NP 09/23/2017, 2:21 PM

## 2017-09-23 NOTE — Progress Notes (Signed)
PCCM Interval Note  Treatment of her COPD, hypoxemia both daytime and nocturnal crucial here.  Review of chart suggests that split night PSG has been ordered (twice) but never scheduled / completed.   I will set her up with [facility] follow-up visit to get this done >> 10:45am 09/27/17 w Willette Brace, NP  She is currently on formulary sub BD regimen of Incruse + Breo. Needs to be discharged back on her usual Trelegy 1 inhalation once a day, albuterol 2 puffs q4h prn.    Call if we can help before she goes home.    Baltazar Apo, MD, PhD 09/23/2017, 9:07 AM [facility] [phone] or if no answer [phone]

## 2017-09-23 NOTE — Progress Notes (Addendum)
Progress Note  Patient Name: Elizabeth [name] Date of Encounter: 09/23/2017  Primary Cardiologist: Dr. Sallyanne Kuster  Subjective   She feels significantly better today.  Inpatient Medications    Scheduled Meds: . aspirin  81 mg Oral Daily  . carvedilol  12.5 mg Oral BID WC  . clopidogrel  75 mg Oral Daily  . colchicine  0.6 mg Oral BID  . fluticasone furoate-vilanterol  1 puff Inhalation Daily   And  . umeclidinium bromide  1 puff Inhalation Daily  . irbesartan  150 mg Oral Daily  . pantoprazole  40 mg Oral Daily  . potassium chloride  40 mEq Oral BID  . sodium chloride flush  3 mL Intravenous Q12H  . sodium chloride flush  3 mL Intravenous Q12H  . torsemide  40 mg Oral BID   Continuous Infusions: . sodium chloride    . sodium chloride     PRN Meds: sodium chloride, sodium chloride, acetaminophen, albuterol, ALPRAZolam, guaiFENesin-dextromethorphan, nitroGLYCERIN, ondansetron (ZOFRAN) IV, oxyCODONE-acetaminophen, sodium chloride flush, zolpidem   Vital Signs    Vitals:   09/23/17 0000 09/23/17 0255 09/23/17 0345 09/23/17 0700  BP: 90/70 105/66 107/70 98/67  Pulse: 82 78 73 73  Resp:  (!) 21 (!) 26 (!) 21  Temp:   97.7 F (36.5 C) 98.1 F (36.7 C)  TempSrc:   Oral Oral  SpO2: 94% 91% 92% 90%  Weight:   219 lb 12.8 oz (99.7 kg)   Height:        Intake/Output Summary (Last 24 hours) at 09/23/2017 0948 Last data filed at 09/23/2017 0433 Gross per 24 hour  Intake 947 ml  Output 650 ml  Net 297 ml   Filed Weights   09/21/17 0429 09/22/17 0540 09/23/17 0345  Weight: 204 lb 12.8 oz (92.9 kg) 220 lb 6.4 oz (100 kg) 219 lb 12.8 oz (99.7 kg)    Telemetry    NSR without significant ventricular ectopy - Personally Reviewed  ECG    NSR with TWI in lateral leads - Personally Reviewed  Physical Exam   GEN: No acute distress.   Neck: No JVD Cardiac: RRR, no murmurs, rubs, or gallops.  Respiratory: Clear to auscultation bilaterally. GI: Soft, nontender,  non-distended  MS: No edema; No deformity. Neuro:  Nonfocal  Psych: Normal affect   Labs    Chemistry Recent Labs  Lab 09/16/17 2222  09/19/17 0346 09/22/17 0458 09/23/17 0341  NA  --    < > 137 139 136  K  --    < > 4.4 4.4 4.9  CL  --    < > 104 106 106  CO2  --    < > 22 20* 20*  GLUCOSE  --    < > 96 107* 99  BUN  --    < > 16 17 21*  CREATININE  --    < > 0.88 1.16* 1.32*  CALCIUM  --    < > 9.4 9.4 9.3  PROT 8.2*  --   --   --   --   ALBUMIN 3.9  --   --   --   --   AST 43*  --   --   --   --   ALT 21  --   --   --   --   ALKPHOS 148*  --   --   --   --   BILITOT 1.0  --   --   --   --  GFRNONAA  --    < > >60 50* 43*  GFRAA  --    < > >60 59* 50*  ANIONGAP  --    < > _0 < > = values in this interval not displayed.     Hematology Recent Labs  Lab 09/21/17 0556 09/22/17 0412 09/23/17 0341  WBC 6.7 6.4 6.1  RBC 4.76 4.97 4.63  HGB 11.9* 13.0 12.1  HCT 40.1 41.8 38.8  MCV 84.2 84.1 83.8  MCH 25.0* 26.2 26.1  MCHC 29.7* 31.1 31.2  RDW 18.5* 18.3* 18.2*  PLT 239 244 232    Cardiac Enzymes Recent Labs  Lab 09/17/17 1832 09/18/17 0018  TROPONINI <0.03 <0.03    No results for input(s): TROPIPOC in the last 168 hours.   BNP No results for input(s): BNP, PROBNP in the last 168 hours.   DDimer No results for input(s): DDIMER in the last 168 hours.   Radiology    No results found.  Cardiac Studies   Conclusion     Prox Cx to Mid Cx lesion is 35% stenosed.  Prox RCA to Mid RCA lesion is 100% stenosed.  SVG-RCA: Prox Graft lesion is 50% stenosed.  Prox LAD lesion is 50% stenosed. Prox LAD to Mid LAD lesion is 75% stenosed at 2nd Diag. There is competitive flow from the LIMA graft distally.  Ost 1st Diag lesion is 70% stenosed. mid 1st Diag 70%  LIMA-dLAD was very difficult to engage via femoral access. With nonselective angiography of the graft is widely patent to the very distal LAD. Competitive flow noted back to the second  diagonal branch.  Ost 3rd Mrg-1 lesion is 100% stenosed. -Flush occlusion from the native circumflex  Ost 3rd Mrg-2 lesion is 85% stenosed at the anastomosis site of the SVG  Mid Cx to Dist Cx lesion is 80% stenosed.  Dist Cx lesion is 75% stenosed. Just prior to insertion of second limb of SVG-OM3-dCx  SVG-OM3-dCx mid Graft to Dist Graft lesion before Ost 3rd Mrg is 80% stenosed -prior to insertion on 3rd Mrg  Hemodynamic findings consistent with moderate pulmonary hypertension. With moderately elevated LVEDP and PCWP.  Mild reduction in cardiac output by Fick despite normal EF by Echocardiogram   Patient has severe native coronary disease, relatively stable from previous catheterization.   The RCA is now 100% occluded, the LAD distally has competitive flow from the LIMA. Both the vein grafts have tubular narrowings in the more proximal segment of the RCA graft is only 50% whereas the sequential OM-dCx graft has a more significant 80% stenosis.  There is a severe anastomotic lesion of the vein graft to OM 3.  Moderate to severe pulmonary hypertension with only mild to moderately elevated filling pressures.  Likely primary pulmonary potential component.     Patient Profile     61 y.o. female with a history of CAD s/p CABG 05/2016 w/ LIMA-LAD, SVG-OM-CFX, SVG-RCA, CHF, COPD on home oxygen, hyperlipidemia, hypertension, fibromyalgia, GERD, PAF formerly on amiodarone now on Eliquis, remote cocaine useand lupus who presented with chest pain since yesterday and also recent weight gain, DOE and bilateral lower extremity swelling. Mild CHF on CXR, negative troponin, BNP 668.9. Negative for PE.   Assessment & Plan    1. Acute on chronic diastolic heart failure - she is still fluid overloaded, hold lasix today as crea is elevated post cath yesterday. Will transition to torsemide 65m BID starting tomorrow, we will arrange for an outpatient follow  up the next week with BMP.  2. CAD s/p CABG x  4 05/2016:   - Cath 09/20/2017 showed graft lesion and SVG to OM/dLCx as well as anastomatic lesion and OM3. Loaded on plavix on 09/20/2017. Not on ASA given the need for eliquis. S/p Successful POBA of anastomotic lesion in OM distal to SVG insertion and successful stenting of the body of SVG to OM with DES yesterday.  3. PAF on eliquis: eliquis currently being held pending PCI on 09/22/2017.  4. Severe pulmonary hypertension: seen by HF service yesterday, mixed pulmonary hypertension, CHF and underlying lung disease, the mainstay will be fluid management, weight loss, sleep apnea evaluation.    5. Hypokalemia: resolved.   Discharge today. We will arrange for a follow up. Please arrange for an outpatient sleep study.   Ena Dawley, MD 09/23/2017

## 2017-09-23 NOTE — Care Management Important Message (Signed)
Important Message  Patient Details  Name: Elizabeth [name] MRN: [id number] Date of Birth: [birth year]   Medicare Important Message Given:  Yes    Elizabeth [name] 09/23/2017, 1:11 PM

## 2017-09-23 NOTE — Progress Notes (Signed)
Physical Therapy Treatment Patient Details Name: Elizabeth [name] MRN: [id number] DOB: 1956-02-11 Today's Date: 09/23/2017    History of Present Illness Pt is a 61 y.o.femalewith a history of CAD s/p CABG 05/2016 w/ LIMA-LAD, SVG-OM-CFX, SVG-RCA, CHF, COPD on home oxygen, hyperlipidemia, hypertension, fibromyalgia, GERD, PAF formerly on amiodarone now on Eliquis, remote cocaine useand lupus who presented with chest pain since yesterday and also recent weight gain, DOE and bilateral lower extremity swelling. Mild CHF on CXR, negative troponin, BNP 668.9. Negative for PE. Pt underwent stent placement on 11/7.     PT Comments    Pt progressing towards goals. Continues to be limited in ambulation tolerance secondary to SOB and DOE. Oxygen sats decreased to 85% on 6L and required seated rest and cues for pursed lip breathing to return to 90% on 6L. Educated about energy conservation and activity pacing at home. Current recommendations remain appropriate. Daughter will be staying with her until next week. Will continue to follow acutely to increase independence and safety with functional mobility.    Follow Up Recommendations  Home health PT;Supervision for mobility/OOB     Equipment Recommendations  None recommended by PT    Recommendations for Other Services       Precautions / Restrictions Precautions Precautions: Fall Precaution Comments: Watch sats  Restrictions Weight Bearing Restrictions: No    Mobility  Bed Mobility               General bed mobility comments: Sitting at EOB upon entry   Transfers Overall transfer level: Needs assistance Equipment used: Rolling walker (2 wheeled) Transfers: Sit to/from Stand Sit to Stand: Supervision         General transfer comment: Supervision for safety. Verbal cues for safe hand placment as pt wanting to pull up on RW.   Ambulation/Gait Ambulation/Gait assistance: Supervision Ambulation Distance (Feet): 150  Feet Assistive device: Rolling walker (2 wheeled) Gait Pattern/deviations: Step-through pattern;Decreased stride length Gait velocity: Decreased Gait velocity interpretation: Below normal speed for age/gender General Gait Details: Slow pace ambulation. Verbal cues for pursed lip breathing secondary to SOB. Required standing rest breaks X 2 secondary to fatigue and SOB. Oxygen sats dropped to 85% on 6L, however, increased to 90% on 6L with seated rest and cues for pursed lip breathing.    Stairs            Wheelchair Mobility    Modified Rankin (Stroke Patients Only)       Balance Overall balance assessment: Needs assistance Sitting-balance support: Feet supported;No upper extremity supported Sitting balance-Leahy Scale: Good     Standing balance support: No upper extremity supported;During functional activity;Bilateral upper extremity supported Standing balance-Leahy Scale: Fair Standing balance comment: able to maintain static standing without UE support                             Cognition Arousal/Alertness: Awake/alert Behavior During Therapy: WFL for tasks assessed/performed Overall Cognitive Status: Within Functional Limits for tasks assessed                                        Exercises      General Comments General comments (skin integrity, edema, etc.): Educated pt and family about energy conservation techniques and activity pacing at home.       Pertinent Vitals/Pain Pain Assessment: No/denies pain    Home  Living                      Prior Function            PT Goals (current goals can now be found in the care plan section) Acute Rehab PT Goals Patient Stated Goal: to get better PT Goal Formulation: With patient Time For Goal Achievement: 10/04/17 Potential to Achieve Goals: Good Progress towards PT goals: Progressing toward goals    Frequency    Min 3X/week      PT Plan Current plan remains  appropriate    Co-evaluation              AM-PAC PT "6 Clicks" Daily Activity  Outcome Measure  Difficulty turning over in bed (including adjusting bedclothes, sheets and blankets)?: None Difficulty moving from lying on back to sitting on the side of the bed? : None Difficulty sitting down on and standing up from a chair with arms (e.g., wheelchair, bedside commode, etc,.)?: None Help needed moving to and from a bed to chair (including a wheelchair)?: None Help needed walking in hospital room?: A Little Help needed climbing 3-5 steps with a railing? : A Little 6 Click Score: 22    End of Session Equipment Utilized During Treatment: Gait belt;Oxygen Activity Tolerance: Patient limited by fatigue;Other (comment)(SOB decreased O2) Patient left: in bed;with call bell/phone within reach;with family/visitor present(sitting EOB ) Nurse Communication: Mobility status PT Visit Diagnosis: Unsteadiness on feet (R26.81);Difficulty in walking, not elsewhere classified (R26.2)     Time: 1205-1220 PT Time Calculation (min) (ACUTE ONLY): 15 min  Charges:  $Gait Training: 8-22 mins                    G Codes:       Leighton Ruff, PT, DPT  Acute Rehabilitation Services  Pager: [phone]    Rudean Hitt 09/23/2017, 12:32 PM

## 2017-09-23 NOTE — Care Management Note (Signed)
Case Management Note  Patient Details  Name: Elizabeth [name] MRN: [id number] Date of Birth: 03-Feb-1956  Subjective/Objective:  From home, has home oxygen with [facility] s/p coronary stent intervention, will be on plavix.  She would like to be evaluated for portable oxygen.  NCM notified Butch Penny with [facility] , MD put order in for evaluation for portable oxygen. She is going home today.  She ambulated 250 feet with Cardiac Rehab.                  Action/Plan: DC home today.   Expected Discharge Date:  09/18/17               Expected Discharge Plan:  Home/Self Care  In-House Referral:     Discharge planning Services  CM Consult  Post Acute Care Choice:    Choice offered to:     DME Arranged:    DME Agency:     HH Arranged:    HH Agency:     Status of Service:  Completed, signed off  If discussed at [employer] of Stay Meetings, dates discussed:    Additional Comments:  Zenon Mayo, RN 09/23/2017, 9:32 AM

## 2017-09-24 ENCOUNTER — Other Ambulatory Visit: Payer: Self-pay | Admitting: *Deleted

## 2017-09-24 ENCOUNTER — Telehealth: Payer: Self-pay | Admitting: *Deleted

## 2017-09-24 ENCOUNTER — Telehealth (HOSPITAL_COMMUNITY): Payer: Self-pay

## 2017-09-24 ENCOUNTER — Encounter: Payer: Self-pay | Admitting: *Deleted

## 2017-09-24 NOTE — Patient Outreach (Addendum)
[facility]) Care Management [place] Telephone Outreach, Transition of Care attempt #1  09/24/2017  ALYSIAH SUPPA 19-Jul-1956 [id number]  Successful outgoing telephone outreach to [name],  61 y/o female previously active with [facility]; received new referral for transition of care after recent hospitalization November 1-8, 2018 for chest pain/ DOE, bilateral LE swelling, and Acute on chronic CHF exacerbation.  Patient has history including, but not limited to, dCHF, pAF, CAD with CABG in 2017, COPD with OSA on home O2, HLD/ HTN, pulmonary HTN, fibromyalgia/ lupus, and obesity.  During most recent hospitalization, patient had cardiac catherization with PCI (stent placement).  Attempted to verify patient's HIPAA/ identity with her during phone call today, and patient would only verify her name; stated that she wanted to know what the phone call was in reference to; as I attempted to explain [facility] CM services to patient, and to remind her that she had previously been active with [facility] CM services, she abruptly stated that she "did not need any nurse's help," stating that she "has doctors taking care of" her.  Patient then said, "please don't call me back," and she hung the phone up without allowing further response.  Plan:  Will make patient's PCP and [facility] CMA and [facility] hospital liasion aware that patient has refused [facility] CM services today  Oneta Rack, RN, BSN, [place] Care Management  [phone]

## 2017-09-24 NOTE — Telephone Encounter (Signed)
Patients insurance is active and benefits verified through [facility] - No co-pay, no deductible, out of pocket amount of $6,700/$74.59 has been met, no co-insurance, and no pre-authorization is required. Passport/refernece [phone]  Patients insurance is active and benefits verified through Medicaid - No co-pay, no deductible, no out of pocket, no co-insurance, and no pre-authorization is required. Passport/reference [phone]  Patient will be contacted for scheduling.

## 2017-09-27 ENCOUNTER — Encounter: Payer: Self-pay | Admitting: Adult Health

## 2017-09-27 ENCOUNTER — Ambulatory Visit (INDEPENDENT_AMBULATORY_CARE_PROVIDER_SITE_OTHER): Payer: Medicare Other | Admitting: Adult Health

## 2017-09-27 VITALS — BP 124/82 | HR 74 | Ht 62.0 in | Wt 219.2 lb

## 2017-09-27 DIAGNOSIS — I272 Pulmonary hypertension, unspecified: Secondary | ICD-10-CM | POA: Diagnosis not present

## 2017-09-27 DIAGNOSIS — J9611 Chronic respiratory failure with hypoxia: Secondary | ICD-10-CM | POA: Diagnosis not present

## 2017-09-27 DIAGNOSIS — J449 Chronic obstructive pulmonary disease, unspecified: Secondary | ICD-10-CM

## 2017-09-27 DIAGNOSIS — G4733 Obstructive sleep apnea (adult) (pediatric): Secondary | ICD-10-CM | POA: Diagnosis not present

## 2017-09-27 MED ORDER — FLUTICASONE-UMECLIDIN-VILANT 100-62.5-25 MCG/INH IN AEPB
1.0000 | INHALATION_SPRAY | Freq: Every day | RESPIRATORY_TRACT | 0 refills | Status: DC
Start: 2017-09-27 — End: 2017-11-30

## 2017-09-27 NOTE — Assessment & Plan Note (Signed)
Moderate Pulmonary HTN with underlying COPD , CHF , and probable OSA  Cont on O2 , diuresis .  Check sleep study   Plan  . Patient Instructions  Reschedule sleep study.  Continue on TRELEGY 1 puff daily , rinse after use  Continue on Oxygen 2l/m .  Follow up with Dr. Vaughan Browner 6-8 weeks and As needed   Please contact office for sooner follow up if symptoms do not improve or worsen or seek emergency care

## 2017-09-27 NOTE — Telephone Encounter (Signed)
Faxed over Medicaid Reimbursement form to [place], [address] for Dr.Croitoru office.

## 2017-09-27 NOTE — Addendum Note (Signed)
Addended by: Parke Poisson E on: 09/27/2017 04:31 PM   Modules accepted: Orders

## 2017-09-27 NOTE — Assessment & Plan Note (Signed)
Current stable -   Plan  Patient Instructions  Reschedule sleep study.  Continue on TRELEGY 1 puff daily , rinse after use  Continue on Oxygen 2l/m .  Follow up with Dr. Vaughan Browner 6-8 weeks and As needed   Please contact office for sooner follow up if symptoms do not improve or worsen or seek emergency care

## 2017-09-27 NOTE — Progress Notes (Signed)
_0  ID: Elizabeth [name], female    DOB: July 10, 1956, 61 y.o.   MRN: [id number]  Chief Complaint  Patient presents with  . Follow-up    COPD     Referring provider: Ledell Noss, MD  HPI: 61 yo female former smoker followed for Moderate COPD and Chronic Respiratory Failure on O2 .  Pulmonary nodules stable on CT chest 2009, 2013, 02/2017 .  OSA not on CPAP  CAD s/p CABG, A Fib  , CHF , PAH , SLE  Polysubstance abuse -Cocaine    TEST  ANA + 10/2008, repeat ANA + (05/2009), on that visit 05/2009 following labs obtained RF <20, CRP <0.4, ANA titers 1:80,  PFT July 2017 showed moderate COPD with this FEV1 at 62%, ratio 62, FVC 78%, positive bronchodilator response, DLCO 44% Echo 05/2017 LVH, EF 50-55%, Gr 2 DD , PAP 69 mmHg  02/2017 HRCT Chest >no ILD , emphysema , multiple lung nodules stable since 2013.  CT chest 05/2017 >neg PE , CM , emphysema  Sleep study Mild OSA in 2013   R heart cath 11/5:  PAP 64/28 (41)       09/27/2017 Follow up: [facility] follow up /COPD, O2 RF , OSA  Pt returns for follow up from recent hospitalization . She was admitted decompensated CHF and CAD . She was diuresised and stent was placed. Echo showed EF 40-45%. PAP 28mHg Discharged on Plavix and ASA along with Eliquis . Discharge weight 219lbs.  She has secondary Pulmonary HTN . She was recommended on tx of COPD and O2 to keep O2 sat >88-90%. Along with CHF management . She has been recommended for OP sleep study but has missed this appointment in past. We discussed keep follow up for sleep study. She is on TRELGY . Says insurance covers this.  Labs showed a positive ANA , normal ESR and neg RA factor. She says she has hx of SLE and has been referred to Rheumatology next month.  She is feeling better since discharge. She remains weak but feels less dyspnea. Legs swelling is better. Wt have been stable. todays weight is 219.    No Known Allergies  Immunization History  Administered  Date(s) Administered  . Influenza Split 09/26/2012, 01/04/2017  . Influenza Whole 08/14/2009  . Influenza,inj,Quad PF,6+ Mos 08/16/2013, 08/19/2015, 08/31/2017  . Pneumococcal Polysaccharide-23 11/25/2016  . Tdap 11/18/2011    Past Medical History:  Diagnosis Date  . CAD (coronary artery disease)    NSTEMI 08/2011:  LHC 08/21/11: mLAD 60-70%, pCFX occluded, dRCA chronic occlusion with L-R collats, EF 40-45%, inf AK.  PCI:  BMS to CFX.  .[place]land KitchenCHF (congestive heart failure) [place] 05/31/2017  . COPD [place]ronic obstructive pulmonary disease) ([place] 11/17/2016  . Depression   . Fibroids   . GERD (gastroesophageal reflux disease)   . Gout   . HLD (hyperlipidemia)    Chol = 235, LDL = 156 (08/2010)  . Hypertension   . Lupus 2009   ANA + 10/2008, repeat ANA + (05/2009), on that visit 05/2009 following labs obtained RF <20, CRP <0.4, ANA titers 1:80,   . On home oxygen therapy    "2L; 24/7 (05/31/2017)  . Pulmonary nodules 05/2008   noted on CXR and CT 05/2008, repeat CT 10/2008 - Stable small bilateral pulmonary nodules measuring up to 6 m m  . Stroke ([facility] 04/2014   "mini stroke"; denies residual on 05/31/2017    Tobacco History: Social History   Tobacco Use  Smoking Status  Former Smoker  . Packs/day: 0.12  . Years: 45.00  . Pack years: 5.40  . Types: Cigarettes  . Last attempt to quit: 11/16/2016  . Years since quitting: 0.8  Smokeless Tobacco Never Used   Counseling given: Not Answered   Outpatient Encounter Medications as of 09/27/2017  Medication Sig  . acetaminophen (TYLENOL) 500 MG tablet Take 500 mg by mouth every 6 (six) hours as needed for mild pain.  Marland Kitchen albuterol (PROVENTIL HFA;VENTOLIN HFA) 108 (90 Base) MCG/ACT inhaler Inhale 2 puffs into the lungs every 6 (six) hours as needed for wheezing or shortness of breath.  Marland Kitchen albuterol (PROVENTIL) (2.5 MG/3ML) 0.083% nebulizer solution Take 3 mLs (2.5 mg total) by nebulization every 6 (six) hours as needed for wheezing or shortness of  breath.  Marland Kitchen apixaban (ELIQUIS) 5 MG TABS tablet Take 1 tablet (5 mg total) by mouth 2 (two) times daily.  Marland Kitchen aspirin 81 MG chewable tablet Chew 1 tablet (81 mg total) daily by mouth.  . carvedilol (COREG) 12.5 MG tablet TAKE 1 TABLET(12.5 MG) BY MOUTH TWICE DAILY  . colchicine 0.6 MG tablet Take 1 tablet (0.6 mg total) by mouth 2 (two) times daily.  . Fluticasone-Umeclidin-Vilant (TRELEGY ELLIPTA) 100-62.5-25 MCG/INH AEPB Inhale 1 puff into the lungs daily.  . nitroGLYCERIN (NITROSTAT) 0.4 MG SL tablet Place 1 tablet (0.4 mg total) under the tongue every 5 (five) minutes x 3 doses as needed for chest pain.  . pantoprazole (PROTONIX) 40 MG tablet TAKE 1 TABLET(40 MG) BY MOUTH DAILY  . potassium chloride SA (K-DUR,KLOR-CON) 20 MEQ tablet Take 1 tablet (20 mEq total) by mouth daily.  . rosuvastatin (CRESTOR) 20 MG tablet Take 1 tablet (20 mg total) by mouth at bedtime.  . torsemide (DEMADEX) 20 MG tablet Take 2 tablets (40 mg total) 2 (two) times daily by mouth. OK to take an extra tablet daily as needed for weight gain or edema.  . valsartan (DIOVAN) 320 MG tablet Take 320 mg by mouth daily.  . clopidogrel (PLAVIX) 75 MG tablet Take 1 tablet (75 mg total) daily by mouth. (Patient not taking: Reported on 09/27/2017)   No facility-administered encounter medications on file as of 09/27/2017.      Review of Systems  Constitutional:   No  weight loss, night sweats,  Fevers, chills,  +fatigue, or  lassitude.  HEENT:   No headaches,  Difficulty swallowing,  Tooth/dental problems, or  Sore throat,                No sneezing, itching, ear ache, nasal congestion, post nasal drip,   CV:  No chest pain,  Orthopnea, PND,  , anasarca, dizziness, palpitations, syncope.   GI  No heartburn, indigestion, abdominal pain, nausea, vomiting, diarrhea, change in bowel habits, loss of appetite, bloody stools.   Resp:    No chest wall deformity  Skin: no rash or lesions.  GU: no dysuria, change in color of  urine, no urgency or frequency.  No flank pain, no hematuria   MS:  No joint pain or swelling.  No decreased range of motion.  No back pain.    Physical Exam  BP 124/82 (BP Location: Right Arm, Cuff Size: Normal)   Pulse 74   Ht _0  (1.575 m)   Wt 219 lb 3.2 oz (99.4 kg)   LMP 12/30/1994   SpO2 97%   BMI 40.09 kg/m   GEN: A/Ox3; pleasant , NAD, chronically ill appearing on O2 in wc    HEENT:  [place]/AT,  EACs-clear, TMs-wnl, NOSE-clear, THROAT-clear, no lesions, no postnasal drip or exudate noted.   NECK:  Supple w/ fair ROM; no JVD; normal carotid impulses w/o bruits; no thyromegaly or nodules palpated; no lymphadenopathy.    RESP  Decreased BS in bases . no accessory muscle use, no dullness to percussion  CARD:  RRR, no m/r/g, no peripheral edema, pulses intact, no cyanosis or clubbing.  GI:   Soft & nt; nml bowel sounds; no organomegaly or masses detected.   Musco: Warm bil, no deformities or joint swelling noted.   Neuro: alert, no focal deficits noted.    Skin: Warm, no lesions or rashes    Lab Results:  BMET ProBNP  Imaging: Dg Chest 2 View  Result Date: 09/16/2017 CLINICAL DATA:  Left-sided chest pain.  Shortness of breath. EXAM: CHEST  2 VIEW COMPARISON:  08/06/2017 . FINDINGS: Prior CABG. Cardiomegaly with mild bilateral interstitial prominence. Mild CHF cannot be excluded. Mild pneumonitis cannot be excluded. Prominent pleural effusion. No pneumothorax. IMPRESSION: Prior CABG with diffuse mild bilateral interstitial prominence suggesting mild CHF. Mild pneumonitis cannot be excluded . Electronically Signed   By: Marcello Moores  Register   On: 09/16/2017 09:03   Dg Foot Complete Right  Result Date: 09/02/2017 CLINICAL DATA:  Swelling and discoloration of the right foot for 1 month. History of gout. EXAM: RIGHT FOOT COMPLETE - 3+ VIEW COMPARISON:  None. FINDINGS: Subchondral irregularity on both sides of the great toe interphalangeal joint, lateral eccentric. Patient  has history of gout, although these are central erosions rather than juxta-articular and other erosive arthropathy should be considered. History of symptoms for 1 month. No soft tissue calcification. First MTP osteoarthritis with spurring and mild joint narrowing. Postoperative third metatarsal. Osteopenic appearance for age. IMPRESSION: 1. Great toe interphalangeal erosive arthropathy that is nonspecific. Please ensure no infectious symptoms. 2. First MTP joint osteoarthritis. Electronically Signed   By: Monte Fantasia M.D.   On: 09/02/2017 13:48   Ct Angio Chest/abd/pel For Dissection W And/or Wo Contrast  Result Date: 09/16/2017 CLINICAL DATA:  Chest pain radiating into the left shoulder for several hours, initial encounter EXAM: CT ANGIOGRAPHY CHEST, ABDOMEN AND PELVIS TECHNIQUE: Multidetector CT imaging through the chest, abdomen and pelvis was performed using the standard protocol during bolus administration of intravenous contrast. Multiplanar reconstructed images and MIPs were obtained and reviewed to evaluate the vascular anatomy. CONTRAST:  100 mL Isovue 370. COMPARISON:  05/31/2017, 03/11/2017 FINDINGS: CTA CHEST FINDINGS Cardiovascular: Thoracic aorta and its branches demonstrate mild atherosclerotic calcifications. Changes of prior coronary bypass grafting are noted. Coronary calcifications are seen. No aneurysmal dilatation of the aorta is seen. No dissection is noted. The pulmonary artery is well visualize with a normal branching pattern. No findings to suggest pulmonary embolism are identified. Mild but stable cardiac enlargement is noted. Mediastinum/Nodes: Thoracic inlet demonstrates a hypodense nodule in the left lobe of the thyroid measuring approximately 14 mm. It is somewhat better visualized on the current study. Scattered small hilar and mediastinal lymph nodes are noted but improved when compared with the prior study. The esophagus is within normal limits. Lungs/Pleura: Lungs are well  aerated bilaterally. Diffuse emphysematous changes are seen. Scattered pulmonary nodules are identified. The most prominent of these lies in the right middle lobe. These are stable from prior exam. No focal infiltrate or sizable effusion is noted. Musculoskeletal: Mild degenerative changes of the thoracic spine are noted. No acute bony abnormality is seen. Review of the MIP images confirms the above findings. CTA  ABDOMEN AND PELVIS FINDINGS VASCULAR Aorta: Atherosclerotic calcifications of the aorta are noted without aneurysmal dilatation. Normal aortic bifurcation is seen. Celiac: Mild stenosis is noted at the origin of the celiac axis. SMA: Patent without evidence of aneurysm, dissection, vasculitis or significant stenosis. Renals: Single renal arteries are identified bilaterally. No focal stenosis is noted. IMA: Within normal limits. Iliacs : Mild atherosclerotic disease is noted without aneurysmal dilatation or focal stenosis. Veins: No definitive venous abnormality is noted although opacification is limited. Review of the MIP images confirms the above findings. NON-VASCULAR Hepatobiliary: Mild fatty infiltration of the liver is noted. The gallbladder is within normal limits. Pancreas: Unremarkable. No pancreatic ductal dilatation or surrounding inflammatory changes. Spleen: Normal in size without focal abnormality. Adrenals/Urinary Tract: Adrenal glands are unremarkable. Kidneys are normal, without renal calculi, focal lesion, or hydronephrosis. Bladder is unremarkable. Stomach/Bowel: Mild diverticular change of the colon is noted without evidence of diverticulitis. No obstructive or inflammatory changes are seen. The appendix is within normal limits and air filled. Lymphatic: No specific lymphadenopathy is noted. Reproductive: Status post hysterectomy. No adnexal masses. Other: No abdominal wall hernia or abnormality. No abdominopelvic ascites. Musculoskeletal: Degenerative changes of lumbar spine are noted.  No acute bony abnormality is seen. Review of the MIP images confirms the above findings. IMPRESSION: Atherosclerotic changes without evidence of aneurysmal dilatation or dissection. Mild celiac axis stenosis. Left thyroid nodule stable from previous exams. The need for nonemergent workup can be determined on a clinical basis. Scattered benign-appearing pulmonary nodule stable from multiple previous exams. Scattered chronic changes within the abdomen. No acute abnormality is noted. Electronically Signed   By: Inez Catalina M.D.   On: 09/16/2017 10:17     Assessment & Plan:   COPD (chronic obstructive pulmonary disease) ([place]) Current stable -   Plan  Patient Instructions  Reschedule sleep study.  Continue on TRELEGY 1 puff daily , rinse after use  Continue on Oxygen 2l/m .  Follow up with Dr. Vaughan Browner 6-8 weeks and As needed   Please contact office for sooner follow up if symptoms do not improve or worsen or seek emergency care         Obstructive sleep apnea Needs sleep study with underlying Pulmonary HTN and A Fib .   Plan  Set up split night study .   Chronic respiratory failure with hypoxia (HCC) Cont on O2 .   Pulmonary hypertension (HCC) Moderate Pulmonary HTN with underlying COPD , CHF , and probable OSA  Cont on O2 , diuresis .  Check sleep study   Plan  . Patient Instructions  Reschedule sleep study.  Continue on TRELEGY 1 puff daily , rinse after use  Continue on Oxygen 2l/m .  Follow up with Dr. Vaughan Browner 6-8 weeks and As needed   Please contact office for sooner follow up if symptoms do not improve or worsen or seek emergency care            Rexene Edison, NP 09/27/2017

## 2017-09-27 NOTE — Assessment & Plan Note (Signed)
Needs sleep study with underlying Pulmonary HTN and A Fib .   Plan  Set up split night study .

## 2017-09-27 NOTE — Patient Instructions (Addendum)
Reschedule sleep study.  Continue on TRELEGY 1 puff daily , rinse after use  Continue on Oxygen 2l/m .  Follow up with Dr. Vaughan Browner 6-8 weeks and As needed   Please contact office for sooner follow up if symptoms do not improve or worsen or seek emergency care

## 2017-09-27 NOTE — Assessment & Plan Note (Signed)
Cont on O2 .

## 2017-09-28 NOTE — Progress Notes (Deleted)
Cardiology Office Note   Date:  09/28/2017   ID:  Elizabeth, [name] [birth year], MRN [id number]  PCP:  Ledell Noss, MD  Cardiologist: Dr. Sallyanne Kuster 06/14/2017 Elizabeth Ferries, PA-C 09/23/2017 in hospital  No chief complaint on file.   History of Present Illness: Elizabeth [name] is a 61 y.o. female with a history of  BMS CFX 2012, CABG x 4 2017, COPD on home O2, CHF, GERD, HLD, HTN, TIA, ICM w/ EF 50-55% and grade 2 dd on echo 05/2017, pulm nodules, remote cocaine, PAF formerly on amio, CHADSVasc 6 (TIA 2, gender, hypertension, CAD, CHF) on coumadin  Admitted 11/1-11/06/2017 for acute on chronic diastolic CHF, weight 629 pounds at discharge. S/p cardiac cath with drug-eluting stent to the SVG-OM and POBA OM, echo results below. Pulmonary saw and no targeted PAH meds recommended, + bronchodilator therapy, +fluid optimization and sleep study.  Patient seen by Dr. Haroldine Laws who recommended compliance with a 2000 mg sodium diet (no more [place] fried chicken pot pies)  Elizabeth [name] presents for ***   Past Medical History:  Diagnosis Date  . CAD (coronary artery disease)    NSTEMI 08/2011:  LHC 08/21/11: mLAD 60-70%, pCFX occluded, dRCA chronic occlusion with L-R collats, EF 40-45%, inf AK.  PCI:  BMS to CFX.  Marland Kitchen CHF (congestive heart failure) ([place]) 05/31/2017  . COPD (chronic obstructive pulmonary disease) ([place]) 11/17/2016  . Depression   . Fibroids   . GERD (gastroesophageal reflux disease)   . Gout   . HLD (hyperlipidemia)    Chol = 235, LDL = 156 (08/2010)  . Hypertension   . Lupus 2009   ANA + 10/2008, repeat ANA + (05/2009), on that visit 05/2009 following labs obtained RF <20, CRP <0.4, ANA titers 1:80,   . On home oxygen therapy    "2L; 24/7 (05/31/2017)  . Pulmonary nodules 05/2008   noted on CXR and CT 05/2008, repeat CT 10/2008 - Stable small bilateral pulmonary nodules measuring up to 6 m m  . Stroke [facility]) 04/2014   "mini stroke"; denies residual on  05/31/2017    Past Surgical History:  Procedure Laterality Date  . CORONARY ANGIOPLASTY WITH STENT PLACEMENT    . FRACTURE SURGERY    . TUBAL LIGATION    . VAGINAL HYSTERECTOMY  05/2003    Current Outpatient Medications  Medication Sig Dispense Refill  . acetaminophen (TYLENOL) 500 MG tablet Take 500 mg by mouth every 6 (six) hours as needed for mild pain.    Marland Kitchen albuterol (PROVENTIL HFA;VENTOLIN HFA) 108 (90 Base) MCG/ACT inhaler Inhale 2 puffs into the lungs every 6 (six) hours as needed for wheezing or shortness of breath. 1 Inhaler 2  . albuterol (PROVENTIL) (2.5 MG/3ML) 0.083% nebulizer solution Take 3 mLs (2.5 mg total) by nebulization every 6 (six) hours as needed for wheezing or shortness of breath. 120 vial 5  . apixaban (ELIQUIS) 5 MG TABS tablet Take 1 tablet (5 mg total) by mouth 2 (two) times daily. 60 tablet 4  . aspirin 81 MG chewable tablet Chew 1 tablet (81 mg total) daily by mouth. 1 tablet 30  . carvedilol (COREG) 12.5 MG tablet TAKE 1 TABLET(12.5 MG) BY MOUTH TWICE DAILY 180 tablet 3  . clopidogrel (PLAVIX) 75 MG tablet Take 1 tablet (75 mg total) daily by mouth. (Patient not taking: Reported on 09/27/2017) 30 tablet 11  . colchicine 0.6 MG tablet Take 1 tablet (0.6 mg total) by mouth 2 (two) times daily. 30 tablet  0  . Fluticasone-Umeclidin-Vilant (TRELEGY ELLIPTA) 100-62.5-25 MCG/INH AEPB Inhale 1 puff into the lungs daily. 1 each 5  . Fluticasone-Umeclidin-Vilant (TRELEGY ELLIPTA) 100-62.5-25 MCG/INH AEPB Inhale 1 puff daily into the lungs. 2 each 0  . nitroGLYCERIN (NITROSTAT) 0.4 MG SL tablet Place 1 tablet (0.4 mg total) under the tongue every 5 (five) minutes x 3 doses as needed for chest pain. 25 tablet 12  . pantoprazole (PROTONIX) 40 MG tablet TAKE 1 TABLET(40 MG) BY MOUTH DAILY 90 tablet 3  . potassium chloride SA (K-DUR,KLOR-CON) 20 MEQ tablet Take 1 tablet (20 mEq total) by mouth daily. 30 tablet 4  . rosuvastatin (CRESTOR) 20 MG tablet Take 1 tablet (20 mg  total) by mouth at bedtime. 90 tablet 3  . torsemide (DEMADEX) 20 MG tablet Take 2 tablets (40 mg total) 2 (two) times daily by mouth. OK to take an extra tablet daily as needed for weight gain or edema. 190 tablet 6  . valsartan (DIOVAN) 320 MG tablet Take 320 mg by mouth daily.  11   No current facility-administered medications for this visit.     Allergies:   Patient has no known allergies.    Social History:  The patient  reports that she quit smoking about 10 months ago. Her smoking use included cigarettes. She has a 5.40 pack-year smoking history. she has never used smokeless tobacco. She reports that she drinks about 8.4 oz of alcohol per week. She reports that she does not use drugs.   Family History:  The patient's family history includes Cervical cancer in her sister; Heart disease in her mother; Prostate cancer in her brother.    ROS:  Please see the history of present illness. All other systems are reviewed and negative.    PHYSICAL EXAM: VS:  LMP 12/30/1994  , BMI There is no height or weight on file to calculate BMI. GEN: Well nourished, well developed, female in no acute distress  HEENT: normal for age  Neck: no JVD, no carotid bruit, no masses Cardiac: RRR; no murmur, no rubs, or gallops Respiratory:  clear to auscultation bilaterally, normal work of breathing GI: soft, nontender, nondistended, + BS MS: no deformity or atrophy; no edema; distal pulses are 2+ in all 4 extremities   Skin: warm and dry, no rash Neuro:  Strength and sensation are intact Psych: euthymic mood, full affect   EKG:  EKG {ACTION; IS/IS IRS:[id number]} ordered today. The ekg ordered today demonstrates ***  PCI: 09/22/2017  Ost 3rd Mrg-2 lesion is 85% stenosed.  Post intervention, there is a 30% residual stenosis.  Mid Graft to Dist Graft lesion before Ost 3rd Mrg is 80% stenosed.  A drug-eluting stent was successfully placed.  Using a BALLOON EUPHORA RX 2.0X12.  Post intervention,  there is a 0% residual stenosis.  A stent was successfully placed. 1. Successful POBA of anastomotic lesion in OM distal to SVG insertion 2. Successful stenting of the body of SVG to OM with DES Plan: DAPT for one month then discontinue ASA. May resume Eliquis tomorrow if no bleeding complication.  ECHO: 09/22/2017 - Left ventricle: The cavity size was normal. There was mild concentric hypertrophy. Systolic function was mildly to moderately reduced. The estimated ejection fraction was in the range of 40% to 45%. Hypokinesis of the anteroseptal, inferior, and inferoseptal myocardium. Doppler parameters are consistent with abnormal left ventricular relaxation (grade 1 diastolic dysfunction). Doppler parameters are consistent with indeterminate ventricular filling pressure. - Aortic valve: Transvalvular velocity was within the normal  range. There was no stenosis. There was no regurgitation. - Mitral valve: Transvalvular velocity was within the normal range. There was no evidence for stenosis. There was no regurgitation. - Left atrium: The atrium was mildly dilated. - Right ventricle: The cavity size was mildly dilated. Wall thickness was normal. Systolic function was normal. - Right atrium: The atrium was severely dilated. - Tricuspid valve: There was mild regurgitation. - Pulmonary arteries: Systolic pressure was mildly increased. PA peak pressure: 46 mm Hg (S).  Cardiac Cath 09/20/2017 Conclusion    Prox Cx to Mid Cx lesion is 35% stenosed.  Prox RCA to Mid RCA lesion is 100% stenosed.  SVG-RCA: Prox Graft lesion is 50% stenosed.  Prox LAD lesion is 50% stenosed. Prox LAD to Mid LAD lesion is 75% stenosed at 2nd Diag. There is competitive flow from the LIMA graft distally.  Ost 1st Diag lesion is 70% stenosed. mid 1st Diag 70%  LIMA-dLAD was very difficult to engage via femoral access. With nonselective angiography of the graft is widely patent to  the very distal LAD. Competitive flow noted back to the second diagonal branch.  Ost 3rd Mrg-1 lesion is 100% stenosed. -Flush occlusion from the native circumflex  Ost 3rd Mrg-2 lesion is 85% stenosed at the anastomosis site of the SVG  Mid Cx to Dist Cx lesion is 80% stenosed.  Dist Cx lesion is 75% stenosed. Just prior to insertion of second limb of SVG-OM3-dCx  SVG-OM3-dCx mid Graft to Dist Graft lesion before Ost 3rd Mrg is 80% stenosed -prior to insertion on 3rd Mrg  Hemodynamic findings consistent with moderate pulmonary hypertension. With moderately elevated LVEDP and PCWP.  Mild reduction in cardiac output by Fick despite normal EF by Echocardiogram  Patient has severe native coronary disease, relatively stable from previous catheterization.  The RCA is now 100% occluded, the LAD distally has competitive flow from the LIMA. Both the vein grafts have tubular narrowings in the more proximal segment of the RCA graft is only 50% whereas the sequential OM-dCx graft has a more significant 80% stenosis. There is a severe anastomotic lesion of the vein graft to OM 3.  Moderate to severe pulmonary hypertension with only mild to moderately elevated filling pressures. Likely primary pulmonary potential component.    Recent Labs: 11/26/2016: Magnesium 1.9 08/06/2017: Pro B Natriuretic peptide (BNP) 2,160.0 09/16/2017: ALT 21; B Natriuretic Peptide 668.9 09/23/2017: BUN 21; Creatinine, Ser 1.32; Hemoglobin 12.1; Platelets 232; Potassium 4.9; Sodium 136    Lipid Panel    Component Value Date/Time   CHOL 145 03/09/2017 1148   CHOL 236 (H) 08/19/2015 0941   TRIG 104 03/09/2017 1148   HDL 52 03/09/2017 1148   HDL 49 08/19/2015 0941   CHOLHDL 2.8 03/09/2017 1148   VLDL 21 03/09/2017 1148   LDLCALC 72 03/09/2017 1148   LDLCALC 159 (H) 08/19/2015 0941   LDLDIRECT 145.2 10/29/2011 1618     Wt Readings from Last 3 Encounters:  09/27/17 219 lb 3.2 oz (99.4 kg)  09/23/17 219 lb 12.8  oz (99.7 kg)  09/07/17 229 lb 6.4 oz (104.1 kg)     Other studies Reviewed: Additional studies/ records that were reviewed today include: ***.  ASSESSMENT AND PLAN:  1.  ***   Current medicines are reviewed at length with the patient today.  The patient {ACTIONS; HAS/DOES NOT HAVE:19233} concerns regarding medicines.  The following changes have been made:  {PLAN; NO CHANGE:13088:s}  Labs/ tests ordered today include: *** No orders of the defined types  were placed in this encounter.    Disposition:   FU with Dr. Sallyanne Kuster  Signed, Elizabeth Ferries, PA-C  09/28/2017 5:03 PM    [facility] Phone: [phone]; Fax: [phone]  This note was written with the assistance of speech recognition software. Please excuse any transcriptional errors.

## 2017-09-29 ENCOUNTER — Ambulatory Visit: Payer: Medicare Other | Admitting: Physician Assistant

## 2017-09-29 DIAGNOSIS — J449 Chronic obstructive pulmonary disease, unspecified: Secondary | ICD-10-CM | POA: Diagnosis not present

## 2017-09-29 DIAGNOSIS — M329 Systemic lupus erythematosus, unspecified: Secondary | ICD-10-CM | POA: Diagnosis not present

## 2017-09-30 ENCOUNTER — Telehealth: Payer: Self-pay | Admitting: Pulmonary Disease

## 2017-09-30 NOTE — Telephone Encounter (Signed)
Reschedule sleep study.  Continue on TRELEGY 1 puff daily , rinse after use  Continue on Oxygen 2l/m .  Follow up with Dr. Vaughan Browner 6-8 weeks and As needed   Please contact office for sooner follow up if symptoms do not improve or worsen or seek emergency care   Pt is supposed to be on Trelegy only per TP note from 09/27/2017. Roque Cash at [employer] of this information. Nothing further is needed.

## 2017-10-01 ENCOUNTER — Ambulatory Visit: Payer: Medicare Other | Admitting: Physician Assistant

## 2017-10-04 NOTE — Progress Notes (Signed)
CC: follow up after hospitalization for volume overload and coronary catheterization   HPI:  [name] is a 61 y.o. with PMH paroxysmal atrial fibrillation (on eliquis), chronic diastolic heart failure (0/32 EF 50-55%, G2DD) , coronary artery disease (s/p CABGx4), COPD (on 2 L of oxygen at home) ,GERD, hyperlipidemia, hypertension, depression, pulmonary nodules, and CVA who presents for hospital follow up after hospitalization for volume overload . Please see the assessment and plans for the status of the patient chronic medical problems.   Past Medical History:  Diagnosis Date  . CAD (coronary artery disease)    NSTEMI 08/2011:  LHC 08/21/11: mLAD 60-70%, pCFX occluded, dRCA chronic occlusion with L-R collats, EF 40-45%, inf AK.  PCI:  BMS to CFX.  Marland Kitchen CHF (congestive heart failure) ([place]) 05/31/2017  . COPD (chronic obstructive pulmonary disease) ([place]) 11/17/2016  . Depression   . Fibroids   . GERD (gastroesophageal reflux disease)   . Gout   . HLD (hyperlipidemia)    Chol = 235, LDL = 156 (08/2010)  . Hypertension   . Lupus 2009   ANA + 10/2008, repeat ANA + (05/2009), on that visit 05/2009 following labs obtained RF <20, CRP <0.4, ANA titers 1:80,   . On home oxygen therapy    "2L; 24/7 (05/31/2017)  . Pulmonary nodules 05/2008   noted on CXR and CT 05/2008, repeat CT 10/2008 - Stable small bilateral pulmonary nodules measuring up to 6 m m  . Stroke [facility]) 04/2014   "mini stroke"; denies residual on 05/31/2017   Review of Systems:  Refer to history of present illness and assessment and plans for pertinent review of systems, all others reviewed and negative  Physical Exam:  Vitals:   10/05/17 1539  BP: 120/79  Pulse: 78  Temp: 98.2 F (36.8 C)  TempSrc: Oral  SpO2: 92%  Weight: 222 lb 1.6 oz (100.7 kg)   General: resting comfortably in hospital wheelchair, no acute distress  Cardiac: RRR, no murmur appreciated, no peripheral edema  Pulmonary: wearing nasal  canula, decreased air movement, no wheezes or rhonchi MSK: right 1st MTP tender to palpation, dry with noticeably darkened skin over the dorsum of the right foot   Assessment & Plan:   COPD  Last PFTs 05/2016 with moderate obstructive airway disease with response to bronchodilators and severe diffusion defect with minimal restriction. Echo 11/7 had elevated pulmonary artery pressure which is contributing to the diffusion defect, she is also known to have elevated ANA 1:80, ds DNA undetectable, now with centromere 1:1280 which could be dx of CREST. Denies History of oral lesion in the past but this healed and she denies raynaud's, calcinosis or dysphagia. Continues to report DOE. Says that she has been using a Breo inhaler which was left over from hospital discharge on 11/8 and has not yet switched back to trelegy and is not using PRN albuterol.   - encouraged use of Albuterol 2 puffs q4h PRN and Trelegy inhaler daily   - Has rheumatology appointment scheduled  Pulmonary hypertension  Cath 11/5 with severe pulmonary hypertension thought to be primarily due to pulmonary disease because she had only mild to moderately elevated filling pressures. Reporting significant daytime fatigue and difficulties with sleep. Concern at this last hospitalization that OSA may be contributing to her pulmonary hypertension. Patient had sleep study which was positive for mild OSA in 2013. Has had 3 sleep studies ordered in the past but not scheduled.  - Encouraged to schedule sleep study  Atrial fibrillation  Sinus rhythm today and reports compliance with eliquis BID. Following in Afib clinic.  - continue eliquis 5 mg bid   Chronic diastolic congestive heart failure  She does report DOE but denies chest pain, orthopnea, PND, or peripheral edema. Appears euvolemic on exam today. On carvedilol with HR 78 today, I believe switching to a cardioselective beta -1 blocker may be safer for COPD. - referral to [facility]  -  discontinue Carvedilol 12.5 mg bid, switch to metoprolol succinate 50 mg qd  - continue Torsemide 60 mg qd with additional 20 mg PRN weight gain    Hypertension  BP Readings from Last 3 Encounters:  10/05/17 120/79  10/05/17 119/80  09/27/17 124/82  Blood pressure well controlled today however I am switching to a more cardioselective beta blocker to protect from any adverse effects of carvedilol with COPD.  - D/c carvedilol - prescribed metoprolol succinate 50 mg qd and continue valsartan 320 mg qd  - RTC in 1 month for blood pressure recheck with Dr. Maudie Mercury   Right foot pain  Patient came in to [facility] about one month ago complaining of right lower extremity pain for the last 1-2 months. X-ray of the foot showed erosions of the great toe interphalangeal joint. She reports a history of gout which has previously been well controlled with NSAIDs. Prior workup is remarkable for elevated sedimentation rate in the past (25 and 29) but negative CRP and a positive ANA 1:80 with negative ds DNA x2. Colchicine 0.6 mg bid and Tramadol 50 mg TID PRN were prescribed. She was also referred to rheumatology at [place]. Today she says that the foot pain has improved but only somewhat, she is not sure what made it feel better but says that she did try the colchicine. She does have 1st MTP tenderness and general skin darkening over the top of her right foot moreso than left. She declined ultrasound exam of the joint today.  - Follow up scheduled with rheumatology  - continue to monitor   Coronary artery disease  With CABG x4 05/2016 and cath 11/5 which revealed progression of native coronary artery disease and narrowing of the vein grafts with 80% stenosis of the OM to [place] which was stented 11/7. Plan after the stent was to continue DAPT for one month then stop the aspirin. Denies chest pain or worsening of baseline DOE today.   - LDL 73, continue Rosuvastatin 20 mg qd  - Continue aspirin and plavix until 12/7 then  discontinue aspirin  - HR 78 today, switching from carvedilol to metoprolol succinate  - continue valsartan   Health maintenance  - says she will call to schedule a mammogram tomorrow  - declined pap smear today    See Encounters Tab for problem based charting.  Patient discussed with Dr. Angelia Mould

## 2017-10-05 ENCOUNTER — Ambulatory Visit (INDEPENDENT_AMBULATORY_CARE_PROVIDER_SITE_OTHER): Payer: Medicare Other | Admitting: Cardiology

## 2017-10-05 ENCOUNTER — Other Ambulatory Visit: Payer: Self-pay

## 2017-10-05 ENCOUNTER — Ambulatory Visit (INDEPENDENT_AMBULATORY_CARE_PROVIDER_SITE_OTHER): Payer: Medicare Other | Admitting: Internal Medicine

## 2017-10-05 ENCOUNTER — Encounter: Payer: Self-pay | Admitting: Internal Medicine

## 2017-10-05 ENCOUNTER — Encounter: Payer: Self-pay | Admitting: Cardiology

## 2017-10-05 VITALS — BP 120/79 | HR 78 | Temp 98.2°F | Wt 222.1 lb

## 2017-10-05 VITALS — BP 119/80 | HR 66 | Ht 62.0 in | Wt 221.0 lb

## 2017-10-05 DIAGNOSIS — F329 Major depressive disorder, single episode, unspecified: Secondary | ICD-10-CM

## 2017-10-05 DIAGNOSIS — I5032 Chronic diastolic (congestive) heart failure: Secondary | ICD-10-CM

## 2017-10-05 DIAGNOSIS — Z951 Presence of aortocoronary bypass graft: Secondary | ICD-10-CM

## 2017-10-05 DIAGNOSIS — I10 Essential (primary) hypertension: Secondary | ICD-10-CM

## 2017-10-05 DIAGNOSIS — Z7902 Long term (current) use of antithrombotics/antiplatelets: Secondary | ICD-10-CM

## 2017-10-05 DIAGNOSIS — I255 Ischemic cardiomyopathy: Secondary | ICD-10-CM | POA: Diagnosis not present

## 2017-10-05 DIAGNOSIS — Z7982 Long term (current) use of aspirin: Secondary | ICD-10-CM

## 2017-10-05 DIAGNOSIS — I480 Paroxysmal atrial fibrillation: Secondary | ICD-10-CM | POA: Diagnosis not present

## 2017-10-05 DIAGNOSIS — K219 Gastro-esophageal reflux disease without esophagitis: Secondary | ICD-10-CM | POA: Diagnosis not present

## 2017-10-05 DIAGNOSIS — E785 Hyperlipidemia, unspecified: Secondary | ICD-10-CM | POA: Diagnosis not present

## 2017-10-05 DIAGNOSIS — I5043 Acute on chronic combined systolic (congestive) and diastolic (congestive) heart failure: Secondary | ICD-10-CM

## 2017-10-05 DIAGNOSIS — I2510 Atherosclerotic heart disease of native coronary artery without angina pectoris: Secondary | ICD-10-CM | POA: Diagnosis not present

## 2017-10-05 DIAGNOSIS — I110 Hypertensive heart disease with heart failure: Secondary | ICD-10-CM | POA: Diagnosis not present

## 2017-10-05 DIAGNOSIS — Z9861 Coronary angioplasty status: Secondary | ICD-10-CM

## 2017-10-05 DIAGNOSIS — M79674 Pain in right toe(s): Secondary | ICD-10-CM

## 2017-10-05 DIAGNOSIS — Z79899 Other long term (current) drug therapy: Secondary | ICD-10-CM

## 2017-10-05 DIAGNOSIS — M85871 Other specified disorders of bone density and structure, right ankle and foot: Secondary | ICD-10-CM | POA: Diagnosis not present

## 2017-10-05 DIAGNOSIS — R918 Other nonspecific abnormal finding of lung field: Secondary | ICD-10-CM

## 2017-10-05 DIAGNOSIS — Z9981 Dependence on supplemental oxygen: Secondary | ICD-10-CM

## 2017-10-05 DIAGNOSIS — G4733 Obstructive sleep apnea (adult) (pediatric): Secondary | ICD-10-CM

## 2017-10-05 DIAGNOSIS — J449 Chronic obstructive pulmonary disease, unspecified: Secondary | ICD-10-CM

## 2017-10-05 DIAGNOSIS — J9611 Chronic respiratory failure with hypoxia: Secondary | ICD-10-CM | POA: Diagnosis not present

## 2017-10-05 DIAGNOSIS — I25119 Atherosclerotic heart disease of native coronary artery with unspecified angina pectoris: Secondary | ICD-10-CM

## 2017-10-05 DIAGNOSIS — I272 Pulmonary hypertension, unspecified: Secondary | ICD-10-CM | POA: Diagnosis not present

## 2017-10-05 DIAGNOSIS — Z8673 Personal history of transient ischemic attack (TIA), and cerebral infarction without residual deficits: Secondary | ICD-10-CM

## 2017-10-05 DIAGNOSIS — M109 Gout, unspecified: Secondary | ICD-10-CM

## 2017-10-05 DIAGNOSIS — Z7901 Long term (current) use of anticoagulants: Secondary | ICD-10-CM

## 2017-10-05 DIAGNOSIS — Z1239 Encounter for other screening for malignant neoplasm of breast: Secondary | ICD-10-CM

## 2017-10-05 DIAGNOSIS — F17211 Nicotine dependence, cigarettes, in remission: Secondary | ICD-10-CM

## 2017-10-05 NOTE — Patient Instructions (Signed)
Medication Instructions: Please check at home if you have your Plavix. If you do not, call us at [phone] immediately. STOP your Aspirin on December 7th.  If you need a refill on your cardiac medications before your next appointment, please call your pharmacy.   Labwork: Please have the following lab today: BMET   Follow-Up: Your physician wants you to follow-up in: 3 months with Dr. Sallyanne Kuster You will receive a reminder letter in the mail two months in advance. If you don't receive a letter, please call our office at [phone] to schedule this follow-up appointment.   Thank you for choosing [facility]!!

## 2017-10-05 NOTE — Assessment & Plan Note (Signed)
Admitted 09/16/17 with CHF, new LVD, [place] Wgt today 221 lbs

## 2017-10-05 NOTE — Assessment & Plan Note (Signed)
H/O PAF. NSR during recent admission Nov 2018

## 2017-10-05 NOTE — Assessment & Plan Note (Signed)
CABG x 19 May 2016

## 2017-10-05 NOTE — Patient Instructions (Addendum)
Patient left prior to the conclusion of this visit   - Follow up in 1 month with Dr. Maudie Mercury for medication review  - Follow up at patients nearest convenience in [facility] for Pap Smear (says she would prefer to come back for this at a morning apt)  - Follow up in continuity clinic in 3-6 months

## 2017-10-05 NOTE — Assessment & Plan Note (Signed)
On Eliquis, stop ASA Dec 05-2017

## 2017-10-05 NOTE — Assessment & Plan Note (Signed)
OM POBA, SVG-OM PCI with DES Nov 2018

## 2017-10-05 NOTE — Assessment & Plan Note (Signed)
EF 40-45% by echo Nov 2018

## 2017-10-05 NOTE — Assessment & Plan Note (Signed)
On home O2

## 2017-10-05 NOTE — Progress Notes (Signed)
10/05/2017 Elizabeth [name]   03-25-1956  [id number]  Primary Physician Ledell Noss, MD Primary Cardiologist: Dr Sallyanne Kuster  HPI:  Pleasant 61 y/o obese AA female with a history of COPD on home O2, fibromyalgia, PAF on Eliquis, pulmonary hypertension, and CAD. She had CABG x 19 May 2016 with an LIMA-LAD, SVG-OM and dCFX, and SVG-RCA. She has had several admission since for various medical problems. She was most recently admitted 09/16/17-09/23/17 with acute combined systolic and diastolic CHF and [place]. Her EF by echo was 40-45%, previously 50-55%. She was diuresed and had a cath on 09/20/17 which revealed SVG-OM disease and disease in the native OM. On 09/22/17 she underwent PCI to her OM, and PCI with DES to the SVG-OM. She was discharged in stable condition 09/23/17 after a 6 lb wgt loss.   Since discharge she has done well, no chest pain. She has chronic SOB and is on O2. She saw Rexene Edison NP with pulmonary and is to have a sleep study. The pt was not sure if she received a prescription for Plavix. I asked her to check at home and call us if she doesn't have this.    Current Outpatient Medications  Medication Sig Dispense Refill  . acetaminophen (TYLENOL) 500 MG tablet Take 500 mg by mouth every 6 (six) hours as needed for mild pain.    Marland Kitchen albuterol (PROVENTIL HFA;VENTOLIN HFA) 108 (90 Base) MCG/ACT inhaler Inhale 2 puffs into the lungs every 6 (six) hours as needed for wheezing or shortness of breath. 1 Inhaler 2  . albuterol (PROVENTIL) (2.5 MG/3ML) 0.083% nebulizer solution Take 3 mLs (2.5 mg total) by nebulization every 6 (six) hours as needed for wheezing or shortness of breath. 120 vial 5  . apixaban (ELIQUIS) 5 MG TABS tablet Take 1 tablet (5 mg total) by mouth 2 (two) times daily. 60 tablet 4  . aspirin 81 MG chewable tablet Chew 1 tablet (81 mg total) daily by mouth. 1 tablet 30  . carvedilol (COREG) 12.5 MG tablet TAKE 1 TABLET(12.5 MG) BY MOUTH TWICE DAILY 180 tablet 3  .  clopidogrel (PLAVIX) 75 MG tablet Take 1 tablet (75 mg total) daily by mouth. 30 tablet 11  . colchicine 0.6 MG tablet Take 1 tablet (0.6 mg total) by mouth 2 (two) times daily. 30 tablet 0  . Fluticasone-Umeclidin-Vilant (TRELEGY ELLIPTA) 100-62.5-25 MCG/INH AEPB Inhale 1 puff into the lungs daily. 1 each 5  . Fluticasone-Umeclidin-Vilant (TRELEGY ELLIPTA) 100-62.5-25 MCG/INH AEPB Inhale 1 puff daily into the lungs. 2 each 0  . nitroGLYCERIN (NITROSTAT) 0.4 MG SL tablet Place 1 tablet (0.4 mg total) under the tongue every 5 (five) minutes x 3 doses as needed for chest pain. 25 tablet 12  . pantoprazole (PROTONIX) 40 MG tablet TAKE 1 TABLET(40 MG) BY MOUTH DAILY 90 tablet 3  . potassium chloride SA (K-DUR,KLOR-CON) 20 MEQ tablet Take 1 tablet (20 mEq total) by mouth daily. 30 tablet 4  . rosuvastatin (CRESTOR) 20 MG tablet Take 1 tablet (20 mg total) by mouth at bedtime. 90 tablet 3  . torsemide (DEMADEX) 20 MG tablet Take 2 tablets (40 mg total) 2 (two) times daily by mouth. OK to take an extra tablet daily as needed for weight gain or edema. 190 tablet 6  . valsartan (DIOVAN) 320 MG tablet Take 320 mg by mouth daily.  11   No current facility-administered medications for this visit.     No Known Allergies  Past Medical History:  Diagnosis Date  .  CAD (coronary artery disease)    NSTEMI 08/2011:  LHC 08/21/11: mLAD 60-70%, pCFX occluded, dRCA chronic occlusion with L-R collats, EF 40-45%, inf AK.  PCI:  BMS to CFX.  Marland Kitchen CHF (congestive heart failure) ([place]) 05/31/2017  . COPD (chronic obstructive pulmonary disease) ([place]) 11/17/2016  . Depression   . Fibroids   . GERD (gastroesophageal reflux disease)   . Gout   . HLD (hyperlipidemia)    Chol = 235, LDL = 156 (08/2010)  . Hypertension   . Lupus 2009   ANA + 10/2008, repeat ANA + (05/2009), on that visit 05/2009 following labs obtained RF <20, CRP <0.4, ANA titers 1:80,   . On home oxygen therapy    "2L; 24/7 (05/31/2017)  . Pulmonary  nodules 05/2008   noted on CXR and CT 05/2008, repeat CT 10/2008 - Stable small bilateral pulmonary nodules measuring up to 6 m m  . Stroke [facility]) 04/2014   "mini stroke"; denies residual on 05/31/2017    Social History   Socioeconomic History  . Marital status: Divorced    Spouse name: Not on file  . Number of children: Not on file  . Years of education: Not on file  . Highest education level: Not on file  Social Needs  . Financial resource strain: Not on file  . Food insecurity - worry: Not on file  . Food insecurity - inability: Not on file  . Transportation needs - medical: Not on file  . Transportation needs - non-medical: Not on file  Occupational History  . Occupation: Retired  Tobacco Use  . Smoking status: Former Smoker    Packs/day: 0.12    Years: 45.00    Pack years: 5.40    Types: Cigarettes    Last attempt to quit: 11/16/2016    Years since quitting: 0.8  . Smokeless tobacco: Never Used  Substance and Sexual Activity  . Alcohol use: Yes    Alcohol/week: 8.4 oz    Types: 7 Cans of beer, 7 Standard drinks or equivalent per week    Comment: 1 beer/day  . Drug use: No    Comment: 05/31/2017 "nothing since ~ 2016"  . Sexual activity: No  Other Topics Concern  . Not on file  Social History Narrative   Pt lives with sister and 2 nieces.     Family History  Problem Relation Age of Onset  . Heart disease Mother   . Cervical cancer Sister   . Prostate cancer Brother   . Colon cancer Neg Hx      Review of Systems: General: negative for chills, fever, night sweats or weight changes.  Cardiovascular: negative for chest pain, dyspnea on exertion, edema, orthopnea, palpitations, paroxysmal nocturnal dyspnea or shortness of breath Dermatological: negative for rash Respiratory: negative for cough or wheezing Urologic: negative for hematuria Abdominal: negative for nausea, vomiting, diarrhea, bright red blood per rectum, melena, or hematemesis Neurologic: negative  for visual changes, syncope, or dizziness All other systems reviewed and are otherwise negative except as noted above.    Blood pressure 119/80, pulse 66, height _0  (1.575 m), weight 221 lb (100.2 kg), last menstrual period 12/30/1994, SpO2 95 %.  General appearance: alert, cooperative, no distress, moderately obese and in wheelchair, on O2 Lungs: decreased breath sounds Heart: regular rate and rhythm Extremities: no edema Skin: Skin color, texture, turgor normal. No rashes or lesions Neurologic: Grossly normal   ASSESSMENT AND PLAN:   Acute on chronic combined systolic and diastolic CHF (congestive heart  failure) ([place]) Admitted 09/16/17 with CHF, new LVD, [place] Wgt today 221 lbs  Chronic respiratory failure with hypoxia ([place]) On home O2  Hx of CABG CABG x 19 May 2016  CAD S/P percutaneous coronary angioplasty OM POBA, SVG-OM PCI with DES Nov 2018  PAF (paroxysmal atrial fibrillation) (HCC) H/O PAF. NSR during recent admission Nov 2018  Long term current use of anticoagulant On Eliquis, stop ASA Dec 05-2017  Ischemic cardiomyopathy EF 40-45% by echo Nov 2018   PLAN  Stop ASA Dec 7th 2018, continue Plavix and Eliquis. I ordered a BMP today, her SCr was drifting up at discharge- 1.32. I encouraged her to follow through with sleep study.   Kerin Ransom PA-C 10/05/2017 1:19 PM

## 2017-10-06 ENCOUNTER — Telehealth: Payer: Self-pay | Admitting: Physician Assistant

## 2017-10-06 LAB — BASIC METABOLIC PANEL
BUN/Creatinine Ratio: 19 (ref 9–23)
BUN: 19 mg/dL (ref 6–24)
CO2: 24 mmol/L (ref 20–29)
Calcium: 9.5 mg/dL (ref 8.7–10.2)
Chloride: 98 mmol/L (ref 96–106)
Creatinine, Ser: 1 mg/dL (ref 0.57–1.00)
GFR calc Af Amer: 71 mL/min/{1.73_m2} (ref >59)
GFR calc non Af Amer: 62 mL/min/{1.73_m2} (ref >59)
Glucose: 91 mg/dL (ref 65–99)
Potassium: 3.3 mmol/L — ABNORMAL LOW (ref 3.5–5.2)
Sodium: 144 mmol/L (ref 134–144)

## 2017-10-06 NOTE — Telephone Encounter (Signed)
Spoke to [place] at [place]. She needs whole form filled out for [facility] reimbursement, states this was sent over to [place] on Nov 5th to [facility] fax. Please contact her if problems.

## 2017-10-06 NOTE — Telephone Encounter (Signed)
New Message  Jarrett Soho from [facility] rehab called requesting to speak with RN to f/u on pts medication reimbursement form fax to PA. R. Barrett. Please call back to discuss

## 2017-10-08 ENCOUNTER — Encounter: Payer: Self-pay | Admitting: Internal Medicine

## 2017-10-08 DIAGNOSIS — M79674 Pain in right toe(s): Secondary | ICD-10-CM | POA: Insufficient documentation

## 2017-10-08 HISTORY — DX: Pain in right toe(s): M79.674

## 2017-10-08 MED ORDER — METOPROLOL SUCCINATE 50 MG PO CS24: 1.0000 | EXTENDED_RELEASE_CAPSULE | Freq: Every day | ORAL | 0 refills | Status: DC

## 2017-10-08 NOTE — Assessment & Plan Note (Signed)
Reporting significant daytime fatigue and difficulties with sleep. Concern at this last hospitalization that OSA may be contributing to her pulmonary hypertension. Patient had sleep study which was positive for mild OSA in 2013. Has had 3 sleep studies ordered in the past but not scheduled.  - Encouraged to schedule sleep study

## 2017-10-08 NOTE — Assessment & Plan Note (Signed)
Last PFTs 05/2016 with moderate obstructive airway disease with response to bronchodilators and severe diffusion defect with minimal restriction. Echo 11/7 had elevated pulmonary artery pressure which is contributing to the diffusion defect, she is also known to have elevated ANA 1:80, ds DNA undetectable, now with centromere 1:1280 which could be dx of CREST. Denies History of oral lesion in the past but this healed and she denies raynaud's, calcinosis or dysphagia. Continues to report DOE. Says that she has been using a Breo inhaler which was left over from hospital discharge on 11/8 and has not yet switched back to trelegy and is not using PRN albuterol.   - encouraged use of Albuterol 2 puffs q4h PRN and Trelegy inhaler daily   - Has rheumatology appointment scheduled

## 2017-10-08 NOTE — Assessment & Plan Note (Signed)
With CABG x4 05/2016 and cath 11/5 which revealed progression of native coronary artery disease and narrowing of the vein grafts with 80% stenosis of the OM to [place] which was stented 11/7. Plan after the stent was to continue DAPT for one month then stop the aspirin. Denies chest pain or worsening of baseline DOE today.   - LDL 73, continue Rosuvastatin 20 mg qd  - Continue aspirin and plavix until 12/7 then discontinue aspirin  - HR 78 today, switching from carvedilol to metoprolol succinate  - continue valsartan

## 2017-10-08 NOTE — Assessment & Plan Note (Signed)
She does report DOE but denies chest pain, orthopnea, PND, or peripheral edema. Appears euvolemic on exam today. On carvedilol with HR 78 today, I believe switching to a cardioselective beta -1 blocker may be safer for COPD. - referral to [facility]  - discontinue Carvedilol 12.5 mg bid, switch to metoprolol succinate 50 mg qd  - continue Torsemide 60 mg qd with additional 20 mg PRN weight gain

## 2017-10-08 NOTE — Assessment & Plan Note (Signed)
Patient came in to [facility] about one month ago complaining of right lower extremity pain for the last 1-2 months. X-ray of the foot showed erosions of the great toe interphalangeal joint. She reports a history of gout which has previously been well controlled with NSAIDs. Prior workup is remarkable for elevated sedimentation rate in the past (25 and 29) but negative CRP and a positive ANA 1:80 with negative ds DNA x2. Colchicine 0.6 mg bid and Tramadol 50 mg TID PRN were prescribed. She was also referred to rheumatology at [place]. Today she says that the foot pain has improved but only somewhat, she is not sure what made it feel better but says that she did try the colchicine. She does have 1st MTP tenderness and general skin darkening over the top of her right foot moreso than left. She declined ultrasound exam of the joint today.  - Follow up scheduled with rheumatology  - continue to monitor

## 2017-10-08 NOTE — Assessment & Plan Note (Signed)
Sinus rhythm today and reports compliance with eliquis BID. Following in Afib clinic.  - continue eliquis 5 mg bid

## 2017-10-08 NOTE — Assessment & Plan Note (Signed)
BP Readings from Last 3 Encounters:  10/05/17 120/79  10/05/17 119/80  09/27/17 124/82  Blood pressure well controlled today however I am switching to a more cardioselective beta blocker to protect from any adverse effects of carvedilol with COPD.  - D/c carvedilol - prescribed metoprolol succinate 50 mg qd and continue valsartan 320 mg qd  - RTC in 1 month for blood pressure recheck with Dr. Maudie Mercury

## 2017-10-08 NOTE — Addendum Note (Signed)
Addended by: Meryl Dare on: 10/08/2017 02:31 PM   Modules accepted: Orders

## 2017-10-12 NOTE — Progress Notes (Signed)
Internal Medicine Clinic Attending  Case discussed with Dr. Hetty Ely at the time of the visit.  We reviewed the resident's history and exam and pertinent patient test results.  I agree with the assessment, diagnosis, and plan of care documented in the resident's note.

## 2017-10-13 ENCOUNTER — Other Ambulatory Visit: Payer: Self-pay | Admitting: *Deleted

## 2017-10-14 NOTE — Telephone Encounter (Signed)
-----  Message from Lonn Georgia, PA-C sent at 10/14/2017  9:31 AM EST -----   ----- Message ----- From: Reola Mosher Sent: 10/08/2017   8:27 AM To: Lonn Georgia, PA-C, Theodore Demark, RN  Were you able to track down the form? I am in the office next week. Thanks

## 2017-10-14 NOTE — Telephone Encounter (Signed)
Form completed and faxed to rehab [facility]

## 2017-10-15 ENCOUNTER — Telehealth: Payer: Self-pay | Admitting: Physician Assistant

## 2017-10-15 NOTE — Telephone Encounter (Signed)
New message    Jarrett Soho from [facility] rehab request  Medicaid reimbursement form be faxed again.

## 2017-10-18 DIAGNOSIS — R911 Solitary pulmonary nodule: Secondary | ICD-10-CM | POA: Diagnosis not present

## 2017-10-18 DIAGNOSIS — J449 Chronic obstructive pulmonary disease, unspecified: Secondary | ICD-10-CM | POA: Diagnosis not present

## 2017-10-20 ENCOUNTER — Ambulatory Visit (INDEPENDENT_AMBULATORY_CARE_PROVIDER_SITE_OTHER): Payer: Medicare Other | Admitting: Internal Medicine

## 2017-10-20 ENCOUNTER — Other Ambulatory Visit: Payer: Self-pay

## 2017-10-20 ENCOUNTER — Ambulatory Visit: Payer: Medicare Other | Admitting: Pharmacist

## 2017-10-20 ENCOUNTER — Encounter: Payer: Self-pay | Admitting: Internal Medicine

## 2017-10-20 DIAGNOSIS — M1A0710 Idiopathic chronic gout, right ankle and foot, without tophus (tophi): Secondary | ICD-10-CM

## 2017-10-20 DIAGNOSIS — M79671 Pain in right foot: Secondary | ICD-10-CM | POA: Insufficient documentation

## 2017-10-20 DIAGNOSIS — Z79899 Other long term (current) drug therapy: Secondary | ICD-10-CM | POA: Diagnosis not present

## 2017-10-20 DIAGNOSIS — M79672 Pain in left foot: Secondary | ICD-10-CM

## 2017-10-20 DIAGNOSIS — Z90710 Acquired absence of both cervix and uterus: Secondary | ICD-10-CM | POA: Insufficient documentation

## 2017-10-20 DIAGNOSIS — M109 Gout, unspecified: Secondary | ICD-10-CM

## 2017-10-20 MED ORDER — TRAMADOL HCL 50 MG PO TABS: 50.0000 mg | ORAL_TABLET | Freq: Four times a day (QID) | ORAL | 0 refills | Status: DC | PRN

## 2017-10-20 MED ORDER — COLCHICINE 0.6 MG PO TABS: 0.6000 mg | ORAL_TABLET | Freq: Every day | ORAL | 2 refills | Status: DC

## 2017-10-20 NOTE — Patient Instructions (Addendum)
FOLLOW-UP INSTRUCTIONS When: 1 month For: Gout follow-up What to bring: Nothing   It was great seeing you today! I'm sorry you are having issues with your gout.   For this, we need to reduce the inflammation associated with gout. To do this, the BEST medicine is one that you have already been prescribed called COLCHICINE. Please take 1.37m ( 2 pills) when you get home, then 1 pill (.659m daily afterwards. You will need to STAY ON THIS MEDICATION for several months even when you start to feel better.  You will need to come back to clinic in 1 month to see how you are doing and also start another medication to suppress your gout flares.

## 2017-10-20 NOTE — Assessment & Plan Note (Signed)
Chart review shows patient is s/p complete hysterectomy in 2004. Intra-op path report confirmed presence of uterus and cervix. The analysis did not show any malignancy. She has no GU complaints.  -She does not require any further PAP exams -Have updated the HCM tab to reflect this

## 2017-10-20 NOTE — Assessment & Plan Note (Signed)
Patient here with several day history of worsening right great toe pain 3-4 days. She denies any trauma, cut, or drainage. Notes similar presentation when she has had an acute gout attack in the past. She tells me that she has not been taking her daily colchicine as she felt it wasn't doing anything for her shortly before symptom onset. Physical exam shows red, hot, swollen MTP joint on the right foot which is very tender to palpation. There doesn't seem to be any indication of cellulitis or soft tissue infection. -Resume colchicine daily. Patient to take 1.2 mg the first day followed by 0.6 mg daily thereafter -Emphasized the importance of continuing this medication daily even after symptoms resolve -Notified patient that she'll have to return to clinic in 1 month for lab test and also medication adjustment -Did provide an short course of tramadol as well  FOLLOW-UP INSTRUCTIONS When: 1 month For: Gout follow-up. She will need baseline uric acid level at that time (if acute flare resolved) prior to initiating Allopurinol.  -Check uric acid and possibly start Allopurinol in 1 month -Continue to emphasize importance of compliance with daily suppressive therapy

## 2017-10-20 NOTE — Progress Notes (Signed)
Internal Medicine Clinic Attending  Case discussed with Dr. Molt at the time of the visit.  We reviewed the resident's history and exam and pertinent patient test results.  I agree with the assessment, diagnosis, and plan of care documented in the resident's note. 

## 2017-10-20 NOTE — Progress Notes (Signed)
CC: PAP, right great toe pain  HPI:  [name] is a 61 y.o. F who presents today at request of PCP for PAP. Chart review shows patient is s/p complete hysterectomy with a benign pathology report in 2004. Entirety of uterus and cervix was removed per path report.   The patient does come in with acute complaint of right great toe pain 3-4 days. Patient states this feels and looks similar to last time she had a gout flare. Has not been taking her daily colchicine as she feels this was not doing anything for her. Reports taking "a ton" of Tylenol to help with the pain without success.  Past Medical History:  Diagnosis Date  . CAD (coronary artery disease)    NSTEMI 08/2011:  LHC 08/21/11: mLAD 60-70%, pCFX occluded, dRCA chronic occlusion with L-R collats, EF 40-45%, inf AK.  PCI:  BMS to CFX.  Marland Kitchen CHF (congestive heart failure) ([place]) 05/31/2017  . COPD (chronic obstructive pulmonary disease) ([place]) 11/17/2016  . Depression   . Fibroids   . GERD (gastroesophageal reflux disease)   . Gout   . HLD (hyperlipidemia)    Chol = 235, LDL = 156 (08/2010)  . Hypertension   . Lupus 2009   ANA + 10/2008, repeat ANA + (05/2009), on that visit 05/2009 following labs obtained RF <20, CRP <0.4, ANA titers 1:80,   . On home oxygen therapy    "2L; 24/7 (05/31/2017)  . Pulmonary nodules 05/2008   noted on CXR and CT 05/2008, repeat CT 10/2008 - Stable small bilateral pulmonary nodules measuring up to 6 m m  . Stroke ([place]) 04/2014   "mini stroke"; denies residual on 05/31/2017   Review of Systems:   General: Denies fevers, chills, weight loss, fatigue Cardiac: Denies CP, SOB Pulmonary: Denies cough, wheezes Abd: Denies diarrhea, constipation, changes in bowels Extremities: +Swelling. +Redness. +Tenderness of right great toe. Denies weakness. No discharge. No trauma or cuts.  Physical Exam: General: Alert, in no acute distress but does look quite uncomfortable while removing shoe and putting  weight on foot. Pleasant and conversant HEENT: No icterus, injection or ptosis. No hoarseness or dysarthria  Cardiac: RRR, no MGR appreciated Pulmonary: CTA BL with normal WOB on RA. Able to speak in complete sentences Extremities: Increased swelling, warmth and redness of right MTP joint. Tender to palpation. No drainage. No cuts. Warm, perfused.   Vitals:   10/20/17 0859  BP: 121/69  Pulse: 99  Temp: (!) 97.4 F (36.3 C)  TempSrc: Oral  SpO2: 99%  Weight: 224 lb (101.6 kg)  Height: 5' 2" (1.575 m)   Assessment & Plan:   See Encounters Tab for problem based charting.  Patient discussed with Dr. Dareen Piano

## 2017-10-21 ENCOUNTER — Telehealth: Payer: Self-pay | Admitting: Internal Medicine

## 2017-10-21 NOTE — Telephone Encounter (Signed)
Requesting Prednisone to be refilled. Would like this med by today, please call pt back.

## 2017-10-21 NOTE — Telephone Encounter (Signed)
Sorry Dr Hetty Ely. Talked to Dr Danford Bad - stated she will take a look at pt's chart.

## 2017-10-21 NOTE — Telephone Encounter (Signed)
Thank you, Elizabeth [name] was seen in [facility] yesterday and started on colchicine for the gout flair. I think she should continue taking this and it should work to help the pain within a day if this is a gout flair. Will need a synovial fluid analysis the next time that she comes in with acute flair because we do not have that yet and her description of these flairs is not typical of gout. Will forward this to Dr. Danford Bad to see if she has anything to add after seeing her in [facility] yesterday.

## 2017-10-21 NOTE — Telephone Encounter (Signed)
Hello - Agree with Dr. Hetty Ely. Please double check and make sure the patient is taking the Colchicine! Have her call clinic tomorrow if pain is not improved at all.

## 2017-10-21 NOTE — Telephone Encounter (Signed)
Patient is requesting something for the gout, foot is swollen. Pls call patient unable to walk

## 2017-10-21 NOTE — Telephone Encounter (Signed)
Patient is requesting the physician give her some medicine for the gout, Dr. Hetty Ely normally does, her foot is swollen. Pls call patient

## 2017-10-21 NOTE — Telephone Encounter (Signed)
Pt called again, requesting to speak with a nurse about meds. States she is in pain and needs prednisone by today. Please call pt back.

## 2017-10-21 NOTE — Telephone Encounter (Signed)
Returned pt's call - states when she saw Dr Hetty Ely yesterday, only her toes were hurting. Now her complete foot is swollen and painful; having difficulty walking. States she's taking Colchicine as prescribed. Also states she has taken Prednisone before; requesting refill - believes it will decrease the swelling. Please advise. Thanks

## 2017-10-21 NOTE — Telephone Encounter (Signed)
Patient is calling back

## 2017-10-22 ENCOUNTER — Ambulatory Visit: Payer: Medicare Other

## 2017-10-22 ENCOUNTER — Other Ambulatory Visit: Payer: Self-pay | Admitting: Internal Medicine

## 2017-10-22 MED ORDER — PREDNISONE 20 MG PO TABS: 40.0000 mg | ORAL_TABLET | Freq: Every day | ORAL | 0 refills | Status: AC

## 2017-10-22 NOTE — Telephone Encounter (Signed)
Done, pt informed, strike that its at [facility], lol!!!

## 2017-10-22 NOTE — Telephone Encounter (Signed)
I will go ahead and send in the prednisone for her! No need to come to [facility].

## 2017-10-22 NOTE — Telephone Encounter (Signed)
Rx for Prednisone sent to [facility] on [address].

## 2017-10-22 NOTE — Telephone Encounter (Signed)
Spoke w/ pt this am, she states she is worse, appt [facility] 1315 12/7

## 2017-10-22 NOTE — Telephone Encounter (Signed)
Strike that, its at [facility]!

## 2017-10-22 NOTE — Telephone Encounter (Signed)
Oh man... Good thing its Friday. Thank you!

## 2017-10-29 DIAGNOSIS — M329 Systemic lupus erythematosus, unspecified: Secondary | ICD-10-CM | POA: Diagnosis not present

## 2017-10-29 DIAGNOSIS — J449 Chronic obstructive pulmonary disease, unspecified: Secondary | ICD-10-CM | POA: Diagnosis not present

## 2017-10-30 ENCOUNTER — Other Ambulatory Visit: Payer: Self-pay | Admitting: Internal Medicine

## 2017-10-31 ENCOUNTER — Ambulatory Visit (HOSPITAL_BASED_OUTPATIENT_CLINIC_OR_DEPARTMENT_OTHER): Payer: Medicare Other | Attending: Adult Health | Admitting: Internal Medicine

## 2017-10-31 VITALS — Ht 62.0 in | Wt 220.0 lb

## 2017-10-31 DIAGNOSIS — G4733 Obstructive sleep apnea (adult) (pediatric): Secondary | ICD-10-CM | POA: Insufficient documentation

## 2017-11-04 ENCOUNTER — Ambulatory Visit: Payer: Medicare Other

## 2017-11-05 ENCOUNTER — Other Ambulatory Visit: Payer: Self-pay

## 2017-11-05 MED ORDER — FLUTICASONE-UMECLIDIN-VILANT 100-62.5-25 MCG/INH IN AEPB: 1.0000 | INHALATION_SPRAY | Freq: Every day | RESPIRATORY_TRACT | 5 refills | Status: AC

## 2017-11-05 NOTE — Telephone Encounter (Signed)
Received refill request from [facility] for Trelegy. Rx has been sent to preferred pharmacy. Nothing further is needed

## 2017-11-10 ENCOUNTER — Ambulatory Visit: Payer: Medicare Other

## 2017-11-11 ENCOUNTER — Telehealth (HOSPITAL_COMMUNITY): Payer: Self-pay

## 2017-11-11 NOTE — Telephone Encounter (Signed)
Called and spoke with patient in regards to Cardiac Rehab - Patient is interested in the program. Scheduled orientation on 12/21/2017 at 1:30pm. Patient will attend the 11:15am exc class.

## 2017-11-12 ENCOUNTER — Ambulatory Visit (HOSPITAL_COMMUNITY)
Admission: RE | Admit: 2017-11-12 | Discharge: 2017-11-12 | Disposition: A | Discharge status: Home / Self Care | Payer: Medicare Other | Source: Ambulatory Visit | Attending: Internal Medicine | Admitting: Internal Medicine

## 2017-11-12 ENCOUNTER — Encounter (HOSPITAL_COMMUNITY): Payer: Self-pay

## 2017-11-12 ENCOUNTER — Observation Stay (HOSPITAL_COMMUNITY)
Admission: AD | Admit: 2017-11-12 | Discharge: 2017-11-15 | Disposition: A | Discharge status: Home / Self Care | Payer: Medicare Other | Source: Ambulatory Visit | Attending: Oncology | Admitting: Oncology

## 2017-11-12 ENCOUNTER — Other Ambulatory Visit: Payer: Self-pay

## 2017-11-12 ENCOUNTER — Ambulatory Visit (INDEPENDENT_AMBULATORY_CARE_PROVIDER_SITE_OTHER): Payer: Medicare Other | Admitting: Internal Medicine

## 2017-11-12 VITALS — BP 139/86 | HR 100 | Temp 97.5°F | Ht 62.0 in

## 2017-11-12 DIAGNOSIS — J441 Chronic obstructive pulmonary disease with (acute) exacerbation: Secondary | ICD-10-CM | POA: Insufficient documentation

## 2017-11-12 DIAGNOSIS — I509 Heart failure, unspecified: Secondary | ICD-10-CM | POA: Insufficient documentation

## 2017-11-12 DIAGNOSIS — J9621 Acute and chronic respiratory failure with hypoxia: Secondary | ICD-10-CM

## 2017-11-12 DIAGNOSIS — I272 Pulmonary hypertension, unspecified: Secondary | ICD-10-CM

## 2017-11-12 DIAGNOSIS — Z8673 Personal history of transient ischemic attack (TIA), and cerebral infarction without residual deficits: Secondary | ICD-10-CM | POA: Diagnosis not present

## 2017-11-12 DIAGNOSIS — R0602 Shortness of breath: Secondary | ICD-10-CM | POA: Diagnosis not present

## 2017-11-12 DIAGNOSIS — F329 Major depressive disorder, single episode, unspecified: Secondary | ICD-10-CM | POA: Diagnosis not present

## 2017-11-12 DIAGNOSIS — E785 Hyperlipidemia, unspecified: Secondary | ICD-10-CM | POA: Insufficient documentation

## 2017-11-12 DIAGNOSIS — I480 Paroxysmal atrial fibrillation: Secondary | ICD-10-CM

## 2017-11-12 DIAGNOSIS — J111 Influenza due to unidentified influenza virus with other respiratory manifestations: Secondary | ICD-10-CM | POA: Diagnosis present

## 2017-11-12 DIAGNOSIS — I50810 Right heart failure, unspecified: Secondary | ICD-10-CM | POA: Diagnosis not present

## 2017-11-12 DIAGNOSIS — K219 Gastro-esophageal reflux disease without esophagitis: Secondary | ICD-10-CM | POA: Insufficient documentation

## 2017-11-12 DIAGNOSIS — I110 Hypertensive heart disease with heart failure: Secondary | ICD-10-CM | POA: Insufficient documentation

## 2017-11-12 DIAGNOSIS — J9611 Chronic respiratory failure with hypoxia: Secondary | ICD-10-CM

## 2017-11-12 DIAGNOSIS — M109 Gout, unspecified: Secondary | ICD-10-CM | POA: Diagnosis not present

## 2017-11-12 DIAGNOSIS — Z9981 Dependence on supplemental oxygen: Secondary | ICD-10-CM | POA: Insufficient documentation

## 2017-11-12 DIAGNOSIS — Z7901 Long term (current) use of anticoagulants: Secondary | ICD-10-CM

## 2017-11-12 DIAGNOSIS — I2510 Atherosclerotic heart disease of native coronary artery without angina pectoris: Secondary | ICD-10-CM | POA: Insufficient documentation

## 2017-11-12 DIAGNOSIS — J9601 Acute respiratory failure with hypoxia: Secondary | ICD-10-CM | POA: Diagnosis not present

## 2017-11-12 DIAGNOSIS — Z87891 Personal history of nicotine dependence: Secondary | ICD-10-CM | POA: Diagnosis not present

## 2017-11-12 DIAGNOSIS — G4733 Obstructive sleep apnea (adult) (pediatric): Secondary | ICD-10-CM

## 2017-11-12 DIAGNOSIS — J449 Chronic obstructive pulmonary disease, unspecified: Secondary | ICD-10-CM

## 2017-11-12 DIAGNOSIS — J123 Human metapneumovirus pneumonia: Secondary | ICD-10-CM | POA: Insufficient documentation

## 2017-11-12 DIAGNOSIS — Z79899 Other long term (current) drug therapy: Secondary | ICD-10-CM | POA: Diagnosis not present

## 2017-11-12 DIAGNOSIS — Z955 Presence of coronary angioplasty implant and graft: Secondary | ICD-10-CM

## 2017-11-12 DIAGNOSIS — M329 Systemic lupus erythematosus, unspecified: Secondary | ICD-10-CM | POA: Insufficient documentation

## 2017-11-12 DIAGNOSIS — J1289 Other viral pneumonia: Secondary | ICD-10-CM | POA: Diagnosis not present

## 2017-11-12 DIAGNOSIS — Z951 Presence of aortocoronary bypass graft: Secondary | ICD-10-CM | POA: Diagnosis not present

## 2017-11-12 DIAGNOSIS — I252 Old myocardial infarction: Secondary | ICD-10-CM | POA: Insufficient documentation

## 2017-11-12 LAB — CBC WITH DIFFERENTIAL/PLATELET
Basophils Absolute: 0.1 10*3/uL (ref 0.0–0.1)
Basophils Relative: 1 %
Eosinophils Absolute: 0.1 10*3/uL (ref 0.0–0.7)
Eosinophils Relative: 1 %
HCT: 42.4 % (ref 36.0–46.0)
Hemoglobin: 12.9 g/dL (ref 12.0–15.0)
Lymphocytes Relative: 27 %
Lymphs Abs: 1.8 10*3/uL (ref 0.7–4.0)
MCH: 26 pg (ref 26.0–34.0)
MCHC: 30.4 g/dL (ref 30.0–36.0)
MCV: 85.3 fL (ref 78.0–100.0)
Monocytes Absolute: 0.3 10*3/uL (ref 0.1–1.0)
Monocytes Relative: 5 %
Neutro Abs: 4.2 10*3/uL (ref 1.7–7.7)
Neutrophils Relative %: 66 %
Platelets: 181 10*3/uL (ref 150–400)
RBC: 4.97 MIL/uL (ref 3.87–5.11)
RDW: 19.2 % — ABNORMAL HIGH (ref 11.5–15.5)
WBC: 6.5 10*3/uL (ref 4.0–10.5)

## 2017-11-12 LAB — BASIC METABOLIC PANEL
Anion gap: 9 (ref 5–15)
BUN: 11 mg/dL (ref 6–20)
CO2: 30 mmol/L (ref 22–32)
Calcium: 9.4 mg/dL (ref 8.9–10.3)
Chloride: 100 mmol/L — ABNORMAL LOW (ref 101–111)
Creatinine, Ser: 0.89 mg/dL (ref 0.44–1.00)
GFR calc Af Amer: >60 mL/min (ref >60)
GFR calc non Af Amer: >60 mL/min (ref >60)
Glucose, Bld: 109 mg/dL — ABNORMAL HIGH (ref 65–99)
Potassium: 3 mmol/L — ABNORMAL LOW (ref 3.5–5.1)
Sodium: 139 mmol/L (ref 135–145)

## 2017-11-12 MED ORDER — ACETAMINOPHEN 325 MG PO TABS: 650.0000 mg | ORAL_TABLET | Freq: Three times a day (TID) | ORAL | Status: DC

## 2017-11-12 MED ORDER — BUDESONIDE 0.5 MG/2ML IN SUSP
0.5000 mg | Freq: Two times a day (BID) | RESPIRATORY_TRACT | Status: DC
  Administered 2017-11-12 – 2017-11-15 (×6): 0.5 mg via RESPIRATORY_TRACT
  Filled 2017-11-12 (×6): qty 2

## 2017-11-12 MED ORDER — ROSUVASTATIN CALCIUM 20 MG PO TABS: 20.0000 mg | ORAL_TABLET | Freq: Every day | ORAL | Status: DC

## 2017-11-12 MED ORDER — DIPHENHYDRAMINE HCL 25 MG PO CAPS
25.0000 mg | ORAL_CAPSULE | Freq: Four times a day (QID) | ORAL | Status: DC | PRN
  Administered 2017-11-12: 25 mg via ORAL
  Filled 2017-11-12: qty 1

## 2017-11-12 MED ORDER — ONDANSETRON HCL 4 MG/2ML IJ SOLN: 4.0000 mg | Freq: Four times a day (QID) | INTRAMUSCULAR | Status: DC | PRN

## 2017-11-12 MED ORDER — IPRATROPIUM-ALBUTEROL 0.5-2.5 (3) MG/3ML IN SOLN
3.0000 mL | Freq: Four times a day (QID) | RESPIRATORY_TRACT | Status: DC
  Administered 2017-11-12 – 2017-11-14 (×6): 3 mL via RESPIRATORY_TRACT
  Filled 2017-11-12 (×8): qty 3

## 2017-11-12 MED ORDER — ONDANSETRON HCL 4 MG PO TABS: 4.0000 mg | ORAL_TABLET | Freq: Three times a day (TID) | ORAL | Status: DC | PRN

## 2017-11-12 MED ORDER — APIXABAN 5 MG PO TABS
5.0000 mg | ORAL_TABLET | Freq: Two times a day (BID) | ORAL | Status: DC
  Administered 2017-11-12 – 2017-11-15 (×6): 5 mg via ORAL
  Filled 2017-11-12 (×7): qty 1

## 2017-11-12 MED ORDER — DM-GUAIFENESIN ER 30-600 MG PO TB12: 1.0000 | ORAL_TABLET | Freq: Two times a day (BID) | ORAL | Status: DC

## 2017-11-12 MED ORDER — APIXABAN 5 MG PO TABS
5.0000 mg | ORAL_TABLET | Freq: Two times a day (BID) | ORAL | Status: DC
Start: 2017-11-12 — End: 2017-11-12

## 2017-11-12 MED ORDER — IPRATROPIUM-ALBUTEROL 0.5-2.5 (3) MG/3ML IN SOLN: 3.0000 mL | Freq: Four times a day (QID) | RESPIRATORY_TRACT | Status: DC

## 2017-11-12 MED ORDER — POTASSIUM CHLORIDE CRYS ER 20 MEQ PO TBCR
20.0000 meq | EXTENDED_RELEASE_TABLET | Freq: Every day | ORAL | Status: DC
  Administered 2017-11-12 – 2017-11-13 (×2): 20 meq via ORAL
  Filled 2017-11-12 (×2): qty 1

## 2017-11-12 MED ORDER — COLCHICINE 0.6 MG PO TABS
0.6000 mg | ORAL_TABLET | Freq: Every day | ORAL | Status: DC
Start: 2017-11-12 — End: 2017-11-12

## 2017-11-12 MED ORDER — APIXABAN 5 MG PO TABS: 5.0000 mg | ORAL_TABLET | Freq: Two times a day (BID) | ORAL | Status: DC

## 2017-11-12 MED ORDER — ONDANSETRON HCL 4 MG PO TABS: 4.0000 mg | ORAL_TABLET | Freq: Four times a day (QID) | ORAL | Status: DC | PRN

## 2017-11-12 MED ORDER — TORSEMIDE 20 MG PO TABS: 60.0000 mg | ORAL_TABLET | Freq: Two times a day (BID) | ORAL | Status: DC

## 2017-11-12 MED ORDER — COLCHICINE 0.6 MG PO TABS
0.6000 mg | ORAL_TABLET | Freq: Every day | ORAL | Status: DC
  Administered 2017-11-12 – 2017-11-15 (×4): 0.6 mg via ORAL
  Filled 2017-11-12 (×4): qty 1

## 2017-11-12 MED ORDER — METOPROLOL SUCCINATE 50 MG PO CS24: 1.0000 | EXTENDED_RELEASE_CAPSULE | Freq: Every day | ORAL | Status: DC

## 2017-11-12 MED ORDER — BUDESONIDE 0.25 MG/2ML IN SUSP
0.2500 mg | Freq: Two times a day (BID) | RESPIRATORY_TRACT | Status: DC
Start: 2017-11-12 — End: 2017-11-12

## 2017-11-12 MED ORDER — PANTOPRAZOLE SODIUM 40 MG PO TBEC: 40.0000 mg | DELAYED_RELEASE_TABLET | Freq: Every day | ORAL | Status: DC

## 2017-11-12 MED ORDER — FLUTICASONE PROPIONATE 50 MCG/ACT NA SUSP: 2.0000 | Freq: Every day | NASAL | Status: DC

## 2017-11-12 MED ORDER — ACETAMINOPHEN 650 MG RE SUPP: 650.0000 mg | Freq: Four times a day (QID) | RECTAL | Status: DC | PRN

## 2017-11-12 MED ORDER — UMECLIDINIUM-VILANTEROL 62.5-25 MCG/INH IN AEPB
1.0000 | INHALATION_SPRAY | Freq: Every day | RESPIRATORY_TRACT | Status: DC
  Administered 2017-11-12 – 2017-11-15 (×4): 1 via RESPIRATORY_TRACT
  Filled 2017-11-12: qty 14

## 2017-11-12 MED ORDER — TORSEMIDE 20 MG PO TABS: 60.0000 mg | ORAL_TABLET | Freq: Every day | ORAL | Status: DC

## 2017-11-12 MED ORDER — PREDNISONE 20 MG PO TABS: 40.0000 mg | ORAL_TABLET | Freq: Every day | ORAL | Status: DC

## 2017-11-12 MED ORDER — GUAIFENESIN-DM 100-10 MG/5ML PO SYRP
5.0000 mL | ORAL_SOLUTION | ORAL | Status: DC | PRN
  Administered 2017-11-12 – 2017-11-14 (×8): 5 mL via ORAL
  Filled 2017-11-12 (×8): qty 5

## 2017-11-12 MED ORDER — BUDESONIDE 0.5 MG/2ML IN SUSP: 0.5000 mg | Freq: Two times a day (BID) | RESPIRATORY_TRACT | Status: DC

## 2017-11-12 MED ORDER — TRAZODONE HCL 50 MG PO TABS
50.0000 mg | ORAL_TABLET | Freq: Every day | ORAL | Status: AC
  Administered 2017-11-12: 50 mg via ORAL
  Filled 2017-11-12: qty 1

## 2017-11-12 MED ORDER — POTASSIUM CHLORIDE CRYS ER 20 MEQ PO TBCR: 20.0000 meq | EXTENDED_RELEASE_TABLET | Freq: Every day | ORAL | Status: DC

## 2017-11-12 MED ORDER — POTASSIUM CHLORIDE CRYS ER 20 MEQ PO TBCR
40.0000 meq | EXTENDED_RELEASE_TABLET | Freq: Once | ORAL | Status: AC
  Administered 2017-11-12: 40 meq via ORAL
  Filled 2017-11-12: qty 2

## 2017-11-12 MED ORDER — TORSEMIDE 20 MG PO TABS
60.0000 mg | ORAL_TABLET | Freq: Two times a day (BID) | ORAL | Status: DC
  Administered 2017-11-13 – 2017-11-15 (×5): 60 mg via ORAL
  Filled 2017-11-12 (×5): qty 3

## 2017-11-12 MED ORDER — SENNOSIDES-DOCUSATE SODIUM 8.6-50 MG PO TABS: 1.0000 | ORAL_TABLET | Freq: Every evening | ORAL | Status: DC | PRN

## 2017-11-12 MED ORDER — ACETAMINOPHEN 325 MG PO TABS
650.0000 mg | ORAL_TABLET | Freq: Three times a day (TID) | ORAL | Status: DC
  Administered 2017-11-12 – 2017-11-15 (×8): 650 mg via ORAL
  Filled 2017-11-12 (×9): qty 2

## 2017-11-12 MED ORDER — PANTOPRAZOLE SODIUM 40 MG PO TBEC
40.0000 mg | DELAYED_RELEASE_TABLET | Freq: Every day | ORAL | Status: DC
  Administered 2017-11-12 – 2017-11-15 (×4): 40 mg via ORAL
  Filled 2017-11-12 (×4): qty 1

## 2017-11-12 MED ORDER — FLUTICASONE PROPIONATE 50 MCG/ACT NA SUSP
2.0000 | Freq: Every day | NASAL | Status: DC
  Administered 2017-11-12 – 2017-11-15 (×4): 2 via NASAL
  Filled 2017-11-12: qty 16

## 2017-11-12 MED ORDER — IRBESARTAN 300 MG PO TABS: 300.0000 mg | ORAL_TABLET | Freq: Every day | ORAL | Status: DC

## 2017-11-12 MED ORDER — CLOPIDOGREL BISULFATE 75 MG PO TABS: 75.0000 mg | ORAL_TABLET | Freq: Every day | ORAL | Status: DC

## 2017-11-12 MED ORDER — UMECLIDINIUM-VILANTEROL 62.5-25 MCG/INH IN AEPB: 1.0000 | INHALATION_SPRAY | Freq: Every day | RESPIRATORY_TRACT | Status: DC

## 2017-11-12 MED ORDER — METOPROLOL SUCCINATE ER 50 MG PO TB24
50.0000 mg | ORAL_TABLET | Freq: Every day | ORAL | Status: DC
  Administered 2017-11-12 – 2017-11-15 (×4): 50 mg via ORAL
  Filled 2017-11-12 (×4): qty 1

## 2017-11-12 MED ORDER — ACETAMINOPHEN 325 MG PO TABS: 650.0000 mg | ORAL_TABLET | Freq: Four times a day (QID) | ORAL | Status: DC | PRN

## 2017-11-12 MED ORDER — PHENOL 1.4 % MT LIQD
1.0000 | OROMUCOSAL | Status: DC | PRN
  Administered 2017-11-12: 1 via OROMUCOSAL
  Filled 2017-11-12: qty 177

## 2017-11-12 MED ORDER — UMECLIDINIUM-VILANTEROL 62.5-25 MCG/INH IN AEPB
1.0000 | INHALATION_SPRAY | Freq: Every day | RESPIRATORY_TRACT | Status: DC
  Filled 2017-11-12: qty 14

## 2017-11-12 MED ORDER — POTASSIUM CHLORIDE CRYS ER 20 MEQ PO TBCR: 60.0000 meq | EXTENDED_RELEASE_TABLET | Freq: Once | ORAL | Status: DC

## 2017-11-12 MED ORDER — COLCHICINE 0.6 MG PO TABS: 0.6000 mg | ORAL_TABLET | Freq: Every day | ORAL | Status: DC

## 2017-11-12 MED ORDER — METOPROLOL SUCCINATE ER 50 MG PO TB24: 50.0000 mg | ORAL_TABLET | Freq: Every day | ORAL | Status: DC

## 2017-11-12 NOTE — Addendum Note (Signed)
Addended by: Tamsen Roers on: 11/12/2017 01:02 PM   Modules accepted: Miquel Dunn

## 2017-11-12 NOTE — Progress Notes (Signed)
Spoke with nurse. Notified Kamila RN that patient refused IV at this time. This nurse explained reasons for IV access being necessary, but refused. Burman Blacksmith RN to notify [facility] with further IV access if needed. Fran Lowes, RN [facility]

## 2017-11-12 NOTE — Progress Notes (Addendum)
   CC: Shortness of breath  HPI:  Elizabeth [name] is a 61 y.o. with a past medical history signficiant for COPD on chronic 2L [place], pulmonary hypertension, OSA, combined systolic and diastolic heart failure presenting with complaints of shortness of breath and cough. She reports that for the past week she has been having worsening shortness of breath with associated productive cough. She reports initially the cough was clear but for the past 2 days is now yellow/green and increased production. She normally uses 2L [place] at home. Upon arrival today she was satting 82% at rest on 2L [place]. Improved to 88-90% on 3L at rest but continued to drop with speech. Reports subjective fevers at home as well as chills. Afebrile today. Denies any sick contacts. Weight is stable from previous. Reports pleuritic chest pain with coughing.   Past Medical History:  Diagnosis Date  . CAD (coronary artery disease)    NSTEMI 08/2011:  LHC 08/21/11: mLAD 60-70%, pCFX occluded, dRCA chronic occlusion with L-R collats, EF 40-45%, inf AK.  PCI:  BMS to CFX.  Marland Kitchen CHF (congestive heart failure) ([place]) 05/31/2017  . COPD (chronic obstructive pulmonary disease) ([place]) 11/17/2016  . Depression   . Fibroids   . GERD (gastroesophageal reflux disease)   . Gout   . HLD (hyperlipidemia)    Chol = 235, LDL = 156 (08/2010)  . Hypertension   . Lupus 2009   ANA + 10/2008, repeat ANA + (05/2009), on that visit 05/2009 following labs obtained RF <20, CRP <0.4, ANA titers 1:80,   . On home oxygen therapy    "2L; 24/7 (05/31/2017)  . Pulmonary nodules 05/2008   noted on CXR and CT 05/2008, repeat CT 10/2008 - Stable small bilateral pulmonary nodules measuring up to 6 m m  . Stroke [facility]) 04/2014   "mini stroke"; denies residual on 05/31/2017  . Toe pain, right 10/08/2017   Review of Systems:   Negative except as noted in HPI  Physical Exam:  Vitals:   11/12/17 1021  BP: (!) 141/89  Pulse: 82  Temp: (!) 97.5 F (36.4 C)  TempSrc:  Oral  SpO2: (!) 82%  Height: _0  (1.575 m)   Physical Exam GENERAL- alert, co-operative, chronically ill appearing female, in no acute distress HEENT- Atraumatic, normocephalic, PERRL, EOMI, oral mucosa appears moist CARDIAC- RRR, no murmurs, rubs or gallops. RESP- Moving equal volumes of air, and clear to auscultation bilaterally, no wheezes or crackles. Dyspneic with speech. ABDOMEN- Soft, nontender, bowel sounds present. NEURO- No obvious Cr N abnormality. EXTREMITIES- pulse 2+, symmetric, no pedal edema. SKIN- Warm, dry, No rash or lesion. PSYCH- Normal mood and affect, appropriate thought content and speech.  Assessment & Plan:   See Encounters Tab for problem based charting.  Patient discussed with Dr. Rebeca Alert

## 2017-11-12 NOTE — Addendum Note (Signed)
Addended by: Tamsen Roers on: 11/12/2017 01:07 PM   Modules accepted: Orders, SmartSet

## 2017-11-12 NOTE — Assessment & Plan Note (Addendum)
Patient with increased shortness of breath, productive and purulent cough, increased oxygen requirement and dyspnea with speech. Lungs are clear to ausculation without wheezes or crackles and good air movement. She does note using her inhaler prior to coming in to her visit and notes wheezing previously. Weight is stable and appears euvolemic on exam.  Given her increased oxygen requirement will require admission. Discussed with on call team who will come for admission.   Will get BMET, CBC, CXR.

## 2017-11-12 NOTE — H&P (Signed)
Date: 11/12/2017               Patient Name:  Elizabeth [name] MRN: [id number]  DOB: 1955-12-23 Age / Sex: 61 y.o., female   PCP: Ledell Noss, MD         Medical Service: Internal Medicine Teaching Service         Attending Physician: Dr. Lucious Groves, DO    First Contact: Dr. Vickki Muff Pager: [phone]  Second Contact: Dr. Einar Gip Pager: [phone]       After Hours (After 5p/  First Contact Pager: [phone]  weekends / holidays): Second Contact Pager: [phone]   Chief Complaint: SOB, hoarseness,  History of Present Illness: 61 yo female with CAD, COPD on 2L at rest and 4L with exertion, TIA, Lupus, HTN, HLD, HFmrEF, She presented to our clinic this morning with 1 week of worsening hoarseness, cough productive of clear sputum now yellow this morning, shortness of breath.   She feels she may have caught a cold from her niece who is also sick.  He reports good compliance with her trilogy inhaler and with her torsemide.  She has not noticed any lower extremity swelling or weight gain.  She does feel as though she had had some subjective fevers her mentally over the last week.  He describes some pleuritic chest pain worse with coughing.  She denies any nausea or vomiting but she did have one episode of loose stools yesterday.  She states that she continues to not smoke she is done so for about 1 year now she drinks occasional beer but has not had one in 3 weeks.  So far she has tried Mucinex and Robitussin at home with little relief.  She does report getting her flu shot this year, denies any aches or pains.    In clinic she was found to have a mild increase in her O2 requirements.  She was afebrile with a normal white count and no acute changes on X-ray.    Meds:  No outpatient medications have been marked as taking for the 11/12/17 encounter [facility] Encounter).     Allergies: Allergies as of 11/12/2017  . (No Known Allergies)   Past Medical History:  Diagnosis  Date  . CAD (coronary artery disease)    NSTEMI 08/2011:  LHC 08/21/11: mLAD 60-70%, pCFX occluded, dRCA chronic occlusion with L-R collats, EF 40-45%, inf AK.  PCI:  BMS to CFX.  Marland Kitchen CHF (congestive heart failure) ([place]) 05/31/2017  . COPD (chronic obstructive pulmonary disease) ([place]) 11/17/2016  . Depression   . Fibroids   . GERD (gastroesophageal reflux disease)   . Gout   . HLD (hyperlipidemia)    Chol = 235, LDL = 156 (08/2010)  . Hypertension   . Lupus 2009   ANA + 10/2008, repeat ANA + (05/2009), on that visit 05/2009 following labs obtained RF <20, CRP <0.4, ANA titers 1:80,   . On home oxygen therapy    "2L; 24/7 (05/31/2017)  . Pulmonary nodules 05/2008   noted on CXR and CT 05/2008, repeat CT 10/2008 - Stable small bilateral pulmonary nodules measuring up to 6 m m  . Stroke [facility]) 04/2014   "mini stroke"; denies residual on 05/31/2017  . Toe pain, right 10/08/2017    Family History:  Family History  Problem Relation Age of Onset  . Heart disease Mother   . Cervical cancer Sister   . Prostate cancer Brother   . Colon cancer Neg Hx  Social History:  Social History   Socioeconomic History  . Marital status: Divorced    Spouse name: Not on file  . Number of children: Not on file  . Years of education: Not on file  . Highest education level: Not on file  Social Needs  . Financial resource strain: Not on file  . Food insecurity - worry: Not on file  . Food insecurity - inability: Not on file  . Transportation needs - medical: Not on file  . Transportation needs - non-medical: Not on file  Occupational History  . Occupation: Retired  Tobacco Use  . Smoking status: Former Smoker    Packs/day: 0.12    Years: 45.00    Pack years: 5.40    Types: Cigarettes    Last attempt to quit: 11/16/2016    Years since quitting: 0.9  . Smokeless tobacco: Never Used  Substance and Sexual Activity  . Alcohol use: Yes    Alcohol/week: 8.4 oz    Types: 7 Cans of beer, 7 Standard  drinks or equivalent per week    Comment: 1 beer/day  . Drug use: No    Comment: 05/31/2017 "nothing since ~ 2016"  . Sexual activity: No  Other Topics Concern  . Not on file  Social History Narrative   Pt lives with sister and 2 nieces.    Review of Systems: A complete ROS was negative except as per HPI.   Physical Exam: Last menstrual period 12/30/1994. Physical Exam  Constitutional: She is oriented to person, place, and time. No distress.  HENT:  Head: Normocephalic and atraumatic.  Eyes: Right eye exhibits no discharge. Left eye exhibits no discharge.  Neck: Normal range of motion. Neck supple. No JVD present.  Cardiovascular: Normal rate, regular rhythm and normal heart sounds. Exam reveals no gallop and no friction rub.  No murmur heard. Pulmonary/Chest: Effort normal and breath sounds normal. No respiratory distress. She has no wheezes. She has no rales. She exhibits no tenderness.  Pt intentionally trying to make upper airway noise when exhaling  Abdominal: Soft. Bowel sounds are normal. She exhibits no distension and no mass. There is no tenderness. There is no rebound and no guarding.  Musculoskeletal: She exhibits no edema.  Neurological: She is alert and oriented to person, place, and time.  Skin: She is not diaphoretic. No erythema. No pallor.  Psychiatric: Her mood appears anxious.    EKG: personally reviewed my interpretation is none performed  CXR: personally reviewed my interpretation is chronic changes suggestive of small airways disease of bilateral lower lobes unchanged from prior images, no acute cardiopulmonary abnormalities  Assessment & Plan by Problem: Active Problems:   * No active hospital problems. *  Patient Profile: 61 yo female with CAD, COPD on 2L at rest and 4L with exertion, TIA, Lupus, HTN, HLD, HFmrEF, She presented to our clinic this morning with 1 week of worsening hoarseness, cough productive of clear sputum now yellow this morning,  shortness of breath.   acute on chronic COPD: on 2L at rest and 4L with exertion at home. Required 3L at rest in clinic.  Reports what sounds like viral URI symptoms.  Doe say had a change in sputum color this am.    -Duonebs q6  -prednisone 98m x 5 days -mucinex -flonase -claritin -viral respiratory panel -supplemental O2, with O2 sat goal of 90-94%  HFmrEF: pt does not appear to be volume overloaded   -will continue pts home dosage of torsemide 472mBID  Dispo: Admit patient to Observation with expected length of stay less than 2 midnights.  Signed: Katherine Roan, MD 11/12/2017, 2:00 PM  Vickki Muff MD PGY-1 Internal Medicine Pager # [phone]

## 2017-11-12 NOTE — Progress Notes (Signed)
Pt refused to get IV put in arm, educated her on the importance of one, pt still denied. Made MD aware. Will continue to monitor pt and educate on the importance of an IV.

## 2017-11-12 NOTE — Progress Notes (Signed)
Elizabeth [name] is a 61 y.o. female patient admitted from ED awake, alert - oriented  X 4 - no acute distress noted.  VSS - Blood pressure 123/75, pulse 90, temperature 97.8 F (36.6 C), temperature source Oral, height _0  (1.575 m), weight 99.2 kg (218 lb 11.1 oz), last menstrual period 12/30/1994, SpO2 96 %.      Orientation to room, and floor completed with information packet given to patient/family.  Patient declined safety video at this time.  Admission INP armband ID verified with patient/family, and in place.   SR up x 2, fall assessment complete, with patient and family able to verbalize understanding of risk associated with falls, and verbalized understanding to call nsg before up out of bed.  Call light within reach, patient able to voice, and demonstrate understanding.  Skin, clean-dry- intact without evidence of bruising, or skin tears.   No evidence of skin break down noted on exam.     Will cont to eval and treat per MD orders.  Richardean Chimera, RN 11/12/2017 3:40 PM

## 2017-11-12 NOTE — Progress Notes (Signed)
Report was called to [place], RN on [address].  Patient went to bathroom earlier and was 88% oxygen.  Patient  was increased to 4 liters. Repeat was 93%.  Patient returned to 3 liters remains 93%.  Resting in chair.  Patient was transported to [place] [facility] via wheelchair with oxygen at 3 liters.  Sander Nephew, RN 11/12/2017 2:40  PM

## 2017-11-12 NOTE — Progress Notes (Signed)
Notified teaching services regarding medications being entered on the wrong admission date, and not being able to be verified by pharmacy. Awaiting medication orders from MD. Pt resting in bed, will continue to monitor pt.

## 2017-11-12 NOTE — Progress Notes (Signed)
Internal Medicine Clinic Attending  Case discussed with Dr. Charlynn Grimes  at the time of the visit.  We reviewed the resident's history and exam and pertinent patient test results.  I agree with the assessment, diagnosis, and plan of care documented in the resident's note.  Here with dyspnea and productive cough. Hypoxic on home O2 of 2L, also dyspneic to the point of not being able to finish sentences. CXR reviewed by me with some fluid in the fissures and opacification of the left base, unclear if PNA or atelectasis. Story is most suggestive of COPD exacerbation, admitted to inpatient service for hypoxic respiratory failure.  Oda Kilts, MD

## 2017-11-13 DIAGNOSIS — G4733 Obstructive sleep apnea (adult) (pediatric): Secondary | ICD-10-CM | POA: Diagnosis not present

## 2017-11-13 DIAGNOSIS — I2510 Atherosclerotic heart disease of native coronary artery without angina pectoris: Secondary | ICD-10-CM | POA: Diagnosis not present

## 2017-11-13 DIAGNOSIS — I252 Old myocardial infarction: Secondary | ICD-10-CM | POA: Diagnosis not present

## 2017-11-13 DIAGNOSIS — Z7901 Long term (current) use of anticoagulants: Secondary | ICD-10-CM | POA: Diagnosis not present

## 2017-11-13 DIAGNOSIS — I110 Hypertensive heart disease with heart failure: Secondary | ICD-10-CM | POA: Diagnosis not present

## 2017-11-13 DIAGNOSIS — M109 Gout, unspecified: Secondary | ICD-10-CM | POA: Diagnosis not present

## 2017-11-13 DIAGNOSIS — M329 Systemic lupus erythematosus, unspecified: Secondary | ICD-10-CM | POA: Diagnosis not present

## 2017-11-13 DIAGNOSIS — J9601 Acute respiratory failure with hypoxia: Secondary | ICD-10-CM | POA: Diagnosis not present

## 2017-11-13 DIAGNOSIS — Z9981 Dependence on supplemental oxygen: Secondary | ICD-10-CM | POA: Diagnosis not present

## 2017-11-13 DIAGNOSIS — J111 Influenza due to unidentified influenza virus with other respiratory manifestations: Secondary | ICD-10-CM

## 2017-11-13 DIAGNOSIS — Z79899 Other long term (current) drug therapy: Secondary | ICD-10-CM | POA: Diagnosis not present

## 2017-11-13 DIAGNOSIS — E785 Hyperlipidemia, unspecified: Secondary | ICD-10-CM | POA: Diagnosis not present

## 2017-11-13 DIAGNOSIS — I509 Heart failure, unspecified: Secondary | ICD-10-CM | POA: Diagnosis not present

## 2017-11-13 DIAGNOSIS — Z8673 Personal history of transient ischemic attack (TIA), and cerebral infarction without residual deficits: Secondary | ICD-10-CM | POA: Diagnosis not present

## 2017-11-13 DIAGNOSIS — K219 Gastro-esophageal reflux disease without esophagitis: Secondary | ICD-10-CM | POA: Diagnosis not present

## 2017-11-13 DIAGNOSIS — J441 Chronic obstructive pulmonary disease with (acute) exacerbation: Secondary | ICD-10-CM | POA: Diagnosis not present

## 2017-11-13 DIAGNOSIS — Z87891 Personal history of nicotine dependence: Secondary | ICD-10-CM | POA: Diagnosis not present

## 2017-11-13 DIAGNOSIS — I480 Paroxysmal atrial fibrillation: Secondary | ICD-10-CM | POA: Diagnosis not present

## 2017-11-13 LAB — BASIC METABOLIC PANEL
Anion gap: 14 (ref 5–15)
BUN: 12 mg/dL (ref 6–20)
CHLORIDE: 100 mmol/L — AB (ref 101–111)
CO2: 26 mmol/L (ref 22–32)
Calcium: 9.5 mg/dL (ref 8.9–10.3)
Creatinine, Ser: 0.9 mg/dL (ref 0.44–1.00)
GFR calc non Af Amer: >60 mL/min (ref >60)
Glucose, Bld: 105 mg/dL — ABNORMAL HIGH (ref 65–99)
POTASSIUM: 3.3 mmol/L — AB (ref 3.5–5.1)
SODIUM: 140 mmol/L (ref 135–145)

## 2017-11-13 LAB — CBC
HEMATOCRIT: 40.9 % (ref 36.0–46.0)
Hemoglobin: 12.3 g/dL (ref 12.0–15.0)
MCH: 26 pg (ref 26.0–34.0)
MCHC: 30.1 g/dL (ref 30.0–36.0)
MCV: 86.5 fL (ref 78.0–100.0)
Platelets: 163 10*3/uL (ref 150–400)
RBC: 4.73 MIL/uL (ref 3.87–5.11)
RDW: 19.4 % — ABNORMAL HIGH (ref 11.5–15.5)
WBC: 5.5 10*3/uL (ref 4.0–10.5)

## 2017-11-13 LAB — RESPIRATORY PANEL BY PCR
ADENOVIRUS-RVPPCR: NOT DETECTED
Bordetella pertussis: NOT DETECTED
CORONAVIRUS 229E-RVPPCR: NOT DETECTED
CORONAVIRUS HKU1-RVPPCR: NOT DETECTED
CORONAVIRUS NL63-RVPPCR: NOT DETECTED
CORONAVIRUS OC43-RVPPCR: NOT DETECTED
Chlamydophila pneumoniae: NOT DETECTED
Influenza A: NOT DETECTED
Influenza B: NOT DETECTED
METAPNEUMOVIRUS-RVPPCR: DETECTED — AB
Mycoplasma pneumoniae: NOT DETECTED
PARAINFLUENZA VIRUS 1-RVPPCR: NOT DETECTED
PARAINFLUENZA VIRUS 2-RVPPCR: NOT DETECTED
PARAINFLUENZA VIRUS 3-RVPPCR: NOT DETECTED
Parainfluenza Virus 4: NOT DETECTED
RHINOVIRUS / ENTEROVIRUS - RVPPCR: NOT DETECTED
Respiratory Syncytial Virus: NOT DETECTED

## 2017-11-13 MED ORDER — TRAZODONE HCL 50 MG PO TABS
50.0000 mg | ORAL_TABLET | Freq: Every day | ORAL | Status: DC
  Administered 2017-11-13: 50 mg via ORAL
  Filled 2017-11-13: qty 1

## 2017-11-13 MED ORDER — POTASSIUM CHLORIDE CRYS ER 20 MEQ PO TBCR
40.0000 meq | EXTENDED_RELEASE_TABLET | Freq: Once | ORAL | Status: AC
  Administered 2017-11-13: 40 meq via ORAL
  Filled 2017-11-13: qty 2

## 2017-11-13 MED ORDER — POTASSIUM CHLORIDE CRYS ER 20 MEQ PO TBCR
20.0000 meq | EXTENDED_RELEASE_TABLET | Freq: Two times a day (BID) | ORAL | Status: DC
  Administered 2017-11-14: 20 meq via ORAL
  Filled 2017-11-13: qty 1

## 2017-11-13 MED ORDER — POTASSIUM CHLORIDE CRYS ER 20 MEQ PO TBCR: 20.0000 meq | EXTENDED_RELEASE_TABLET | Freq: Two times a day (BID) | ORAL | Status: DC

## 2017-11-13 NOTE — Progress Notes (Signed)
   Subjective: Patient was in good spirits today, she does report having continued coughing fits however overall is feeling okay.  She does not note any increased shortness of breath, she has some chest discomfort after coughing fits, denies any nausea vomiting or diarrhea.    Objective:  Vital signs in last 24 hours: Vitals:   11/12/17 2300 11/13/17 0500 11/13/17 0803 11/13/17 1353  BP:      Pulse:      Resp:      Temp:      TempSrc:      SpO2:   93% 93%  Weight: 218 lb 11.1 oz (99.2 kg) 217 lb 12.8 oz (98.8 kg)    Height: _0  (1.575 m)      Physical Exam  Constitutional: She is oriented to person, place, and time. No distress.  HENT:  Head: Normocephalic and atraumatic.  Eyes: Right eye exhibits no discharge. Left eye exhibits no discharge.  Neck: Normal range of motion. Neck supple. No JVD present.  Cardiovascular: Normal rate, regular rhythm and normal heart sounds. Exam reveals no gallop and no friction rub.  No murmur heard. Pulmonary/Chest: Effort normal and breath sounds normal. No respiratory distress. She has no wheezes. She has no rales. She exhibits no tenderness. Pt had coughing fit during exam.   Abdominal: Soft. Bowel sounds are normal. She exhibits no distension and no mass. There is no tenderness. There is no rebound and no guarding.  Musculoskeletal: She exhibits no edema.  Neurological: She is alert and oriented to person, place, and time.  Skin: She is not diaphoretic. No erythema. No pallor.  Psychiatric: Her mood appears anxious.    Assessment/Plan:  Principal Problem:   Acute respiratory failure with hypoxia (HCC) Active Problems:   COPD with acute exacerbation (HCC)  acute on chronic COPD: on 2L at rest and 4L with exertion at home. Required 3L at rest in clinic.  Reports what sounds like viral URI symptoms.  Does say had a change in sputum color just before admission.     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   WiKatherine RoanMD 11/13/2017, 2:45 PM BrVickki MuffD PGY-1 Internal Medicine Pager # [phone]

## 2017-11-13 NOTE — Discharge Instructions (Signed)

## 2017-11-14 DIAGNOSIS — Z9981 Dependence on supplemental oxygen: Secondary | ICD-10-CM | POA: Diagnosis not present

## 2017-11-14 DIAGNOSIS — Z8673 Personal history of transient ischemic attack (TIA), and cerebral infarction without residual deficits: Secondary | ICD-10-CM | POA: Diagnosis not present

## 2017-11-14 DIAGNOSIS — I110 Hypertensive heart disease with heart failure: Secondary | ICD-10-CM | POA: Diagnosis not present

## 2017-11-14 DIAGNOSIS — K219 Gastro-esophageal reflux disease without esophagitis: Secondary | ICD-10-CM | POA: Diagnosis not present

## 2017-11-14 DIAGNOSIS — I252 Old myocardial infarction: Secondary | ICD-10-CM | POA: Diagnosis not present

## 2017-11-14 DIAGNOSIS — M109 Gout, unspecified: Secondary | ICD-10-CM | POA: Diagnosis not present

## 2017-11-14 DIAGNOSIS — Z79899 Other long term (current) drug therapy: Secondary | ICD-10-CM | POA: Diagnosis not present

## 2017-11-14 DIAGNOSIS — E785 Hyperlipidemia, unspecified: Secondary | ICD-10-CM | POA: Diagnosis not present

## 2017-11-14 DIAGNOSIS — I480 Paroxysmal atrial fibrillation: Secondary | ICD-10-CM | POA: Diagnosis not present

## 2017-11-14 DIAGNOSIS — I509 Heart failure, unspecified: Secondary | ICD-10-CM | POA: Diagnosis not present

## 2017-11-14 DIAGNOSIS — J441 Chronic obstructive pulmonary disease with (acute) exacerbation: Secondary | ICD-10-CM | POA: Diagnosis not present

## 2017-11-14 DIAGNOSIS — Z87891 Personal history of nicotine dependence: Secondary | ICD-10-CM | POA: Diagnosis not present

## 2017-11-14 DIAGNOSIS — J9601 Acute respiratory failure with hypoxia: Secondary | ICD-10-CM | POA: Diagnosis not present

## 2017-11-14 DIAGNOSIS — Z7901 Long term (current) use of anticoagulants: Secondary | ICD-10-CM | POA: Diagnosis not present

## 2017-11-14 DIAGNOSIS — I2510 Atherosclerotic heart disease of native coronary artery without angina pectoris: Secondary | ICD-10-CM | POA: Diagnosis not present

## 2017-11-14 DIAGNOSIS — M329 Systemic lupus erythematosus, unspecified: Secondary | ICD-10-CM | POA: Diagnosis not present

## 2017-11-14 LAB — CBC
HCT: 41.3 % (ref 36.0–46.0)
Hemoglobin: 12.4 g/dL (ref 12.0–15.0)
MCH: 26 pg (ref 26.0–34.0)
MCHC: 30 g/dL (ref 30.0–36.0)
MCV: 86.6 fL (ref 78.0–100.0)
PLATELETS: 168 10*3/uL (ref 150–400)
RBC: 4.77 MIL/uL (ref 3.87–5.11)
RDW: 19.6 % — AB (ref 11.5–15.5)
WBC: 5.1 10*3/uL (ref 4.0–10.5)

## 2017-11-14 LAB — BASIC METABOLIC PANEL
Anion gap: 13 (ref 5–15)
BUN: 13 mg/dL (ref 6–20)
CALCIUM: 8.8 mg/dL — AB (ref 8.9–10.3)
CO2: 27 mmol/L (ref 22–32)
Chloride: 99 mmol/L — ABNORMAL LOW (ref 101–111)
Creatinine, Ser: 1.02 mg/dL — ABNORMAL HIGH (ref 0.44–1.00)
GFR calc Af Amer: >60 mL/min (ref >60)
GFR, EST NON AFRICAN AMERICAN: 59 mL/min — AB (ref >60)
GLUCOSE: 104 mg/dL — AB (ref 65–99)
POTASSIUM: 3.3 mmol/L — AB (ref 3.5–5.1)
Sodium: 139 mmol/L (ref 135–145)

## 2017-11-14 MED ORDER — PREDNISONE 20 MG PO TABS
40.0000 mg | ORAL_TABLET | Freq: Every day | ORAL | Status: DC
  Administered 2017-11-14 – 2017-11-15 (×2): 40 mg via ORAL
  Filled 2017-11-14 (×2): qty 2

## 2017-11-14 MED ORDER — RAMELTEON 8 MG PO TABS
8.0000 mg | ORAL_TABLET | Freq: Every day | ORAL | Status: DC
  Administered 2017-11-14: 8 mg via ORAL
  Filled 2017-11-14: qty 1

## 2017-11-14 MED ORDER — POTASSIUM CHLORIDE CRYS ER 20 MEQ PO TBCR
40.0000 meq | EXTENDED_RELEASE_TABLET | Freq: Two times a day (BID) | ORAL | Status: DC
  Administered 2017-11-14 – 2017-11-15 (×2): 40 meq via ORAL
  Filled 2017-11-14 (×2): qty 2

## 2017-11-14 MED ORDER — IPRATROPIUM-ALBUTEROL 0.5-2.5 (3) MG/3ML IN SOLN
3.0000 mL | Freq: Four times a day (QID) | RESPIRATORY_TRACT | Status: DC | PRN
  Administered 2017-11-14: 3 mL via RESPIRATORY_TRACT

## 2017-11-14 NOTE — Care Management Obs Status (Signed)
[facility] NOTIFICATION   Patient Details  Name: Elizabeth [name] MRN: [id number] Date of Birth: [birth year]   Medicare Observation Status Notification Given:  Yes    Carles Collet, RN 11/14/2017, 9:11 AM

## 2017-11-14 NOTE — Progress Notes (Signed)
   Subjective: Patient says she feels okay today, reports her cough is improved from yesterday.  She felt she may be a little more short of breath today, I asked the nurse to perform pulse ox and it was 96% on 3L.  She denies any nausea vomiting or diarrhea.    Objective:  Vital signs in last 24 hours: Vitals:   11/13/17 0803 11/13/17 1353 11/14/17 0606 11/14/17 0847  BP:   108/74   Pulse:   81   Resp:   18   Temp:   97.8 F (36.6 C)   TempSrc:   Oral   SpO2: 93% 93% 95% 95%  Weight:   218 lb 14.4 oz (99.3 kg)   Height:       Physical Exam  Constitutional: She is oriented to person, place, and time. No distress.  HENT:  Head: Normocephalic and atraumatic.  Eyes: Right eye exhibits no discharge. Left eye exhibits no discharge.  Neck: Normal range of motion. Neck supple. No JVD present.  Cardiovascular: Normal rate, regular rhythm and normal heart sounds. Exam reveals no gallop and no friction rub.  No murmur heard. Pulmonary/Chest: Effort normal and breath sounds normal. No respiratory distress. She has no wheezes. She has no rales. She exhibits no tenderness. Pt had coughing fit during exam.   Abdominal: Soft. Bowel sounds are normal. She exhibits no distension and no mass. There is no tenderness. There is no rebound and no guarding.  Musculoskeletal: She exhibits no edema.  Neurological: She is alert and oriented to person, place, and time.  Skin: She is not diaphoretic. No erythema. No pallor.  Psychiatric: Her mood appears anxious.    Assessment/Plan:  Principal Problem:   Acute respiratory failure with hypoxia (HCC) Active Problems:   COPD with acute exacerbation (HCC)   Influenza in adult  acute on chronic COPD: on 2L at rest and 4L with exertion at home also has type 3 PAH as well seen on RHC and ECHO. Required 3L at rest in clinic.  Reports what sounds like viral URI symptoms.  Does say had a change in sputum color just before admission.     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  Now on metoprolol succinate 5074meliquis 5mg38mD, in sinus this admission  -continue home meds  Dispo: Anticipated discharge in approximately 1 day(s).   WinfKatherine Roan 11/14/2017, 11:11 AM BranVickki MuffPGY-1 Internal Medicine Pager # [phone]

## 2017-11-14 NOTE — Care Management Note (Signed)
Case Management Note  Patient Details  Name: Elizabeth [name] MRN: [id number] Date of Birth: November 29, 1955  Subjective/Objective:                 Patient has [employer] for home oxygen, she states she does not receive any other home services. She has a RW for home use.    Action/Plan:   Expected Discharge Date:                  Expected Discharge Plan:  Home/Self Care  In-House Referral:     Discharge planning Services  CM Consult  Post Acute Care Choice:    Choice offered to:     DME Arranged:    DME Agency:     HH Arranged:    HH Agency:     Status of Service:  In process, will continue to follow  If discussed at Long Length of Stay Meetings, dates discussed:    Additional Comments:  Carles Collet, RN 11/14/2017, 9:11 AM

## 2017-11-14 NOTE — Progress Notes (Signed)
Medicine attending: Clinical status and database reviewed with resident physician Dr. Katherine Roan and I concur with his evaluation and management plan which we discussed together. Cough improving.  Oxygenation stable on 3 L nasal cannula.  Transition to oral steroids today.  Anticipate discharge tomorrow.

## 2017-11-15 ENCOUNTER — Telehealth (HOSPITAL_COMMUNITY): Payer: Self-pay | Admitting: *Deleted

## 2017-11-15 ENCOUNTER — Other Ambulatory Visit: Payer: Self-pay | Admitting: Internal Medicine

## 2017-11-15 DIAGNOSIS — J441 Chronic obstructive pulmonary disease with (acute) exacerbation: Secondary | ICD-10-CM | POA: Diagnosis not present

## 2017-11-15 DIAGNOSIS — M329 Systemic lupus erythematosus, unspecified: Secondary | ICD-10-CM | POA: Diagnosis not present

## 2017-11-15 DIAGNOSIS — E785 Hyperlipidemia, unspecified: Secondary | ICD-10-CM | POA: Diagnosis not present

## 2017-11-15 DIAGNOSIS — J9601 Acute respiratory failure with hypoxia: Secondary | ICD-10-CM | POA: Diagnosis not present

## 2017-11-15 DIAGNOSIS — Z8673 Personal history of transient ischemic attack (TIA), and cerebral infarction without residual deficits: Secondary | ICD-10-CM | POA: Diagnosis not present

## 2017-11-15 DIAGNOSIS — Z87891 Personal history of nicotine dependence: Secondary | ICD-10-CM | POA: Diagnosis not present

## 2017-11-15 DIAGNOSIS — Z7901 Long term (current) use of anticoagulants: Secondary | ICD-10-CM | POA: Diagnosis not present

## 2017-11-15 DIAGNOSIS — J111 Influenza due to unidentified influenza virus with other respiratory manifestations: Secondary | ICD-10-CM | POA: Diagnosis not present

## 2017-11-15 DIAGNOSIS — K219 Gastro-esophageal reflux disease without esophagitis: Secondary | ICD-10-CM | POA: Diagnosis not present

## 2017-11-15 DIAGNOSIS — M109 Gout, unspecified: Secondary | ICD-10-CM | POA: Diagnosis not present

## 2017-11-15 DIAGNOSIS — I509 Heart failure, unspecified: Secondary | ICD-10-CM | POA: Diagnosis not present

## 2017-11-15 DIAGNOSIS — I480 Paroxysmal atrial fibrillation: Secondary | ICD-10-CM | POA: Diagnosis not present

## 2017-11-15 DIAGNOSIS — I2510 Atherosclerotic heart disease of native coronary artery without angina pectoris: Secondary | ICD-10-CM | POA: Diagnosis not present

## 2017-11-15 DIAGNOSIS — Z79899 Other long term (current) drug therapy: Secondary | ICD-10-CM | POA: Diagnosis not present

## 2017-11-15 DIAGNOSIS — I110 Hypertensive heart disease with heart failure: Secondary | ICD-10-CM | POA: Diagnosis not present

## 2017-11-15 DIAGNOSIS — Z9981 Dependence on supplemental oxygen: Secondary | ICD-10-CM | POA: Diagnosis not present

## 2017-11-15 DIAGNOSIS — I252 Old myocardial infarction: Secondary | ICD-10-CM | POA: Diagnosis not present

## 2017-11-15 LAB — BASIC METABOLIC PANEL
ANION GAP: 14 (ref 5–15)
BUN: 15 mg/dL (ref 6–20)
CHLORIDE: 96 mmol/L — AB (ref 101–111)
CO2: 28 mmol/L (ref 22–32)
Calcium: 8.8 mg/dL — ABNORMAL LOW (ref 8.9–10.3)
Creatinine, Ser: 0.92 mg/dL (ref 0.44–1.00)
GFR calc Af Amer: >60 mL/min (ref >60)
Glucose, Bld: 107 mg/dL — ABNORMAL HIGH (ref 65–99)
POTASSIUM: 3.5 mmol/L (ref 3.5–5.1)
SODIUM: 138 mmol/L (ref 135–145)

## 2017-11-15 LAB — CBC
HCT: 40.4 % (ref 36.0–46.0)
HEMOGLOBIN: 12.5 g/dL (ref 12.0–15.0)
MCH: 26.5 pg (ref 26.0–34.0)
MCHC: 30.9 g/dL (ref 30.0–36.0)
MCV: 85.8 fL (ref 78.0–100.0)
PLATELETS: 201 10*3/uL (ref 150–400)
RBC: 4.71 MIL/uL (ref 3.87–5.11)
RDW: 19.5 % — ABNORMAL HIGH (ref 11.5–15.5)
WBC: 6.9 10*3/uL (ref 4.0–10.5)

## 2017-11-15 MED ORDER — TORSEMIDE 20 MG PO TABS: 60.0000 mg | ORAL_TABLET | Freq: Every day | ORAL | 6 refills | Status: AC

## 2017-11-15 MED ORDER — PREDNISONE 20 MG PO TABS: 40.0000 mg | ORAL_TABLET | Freq: Every day | ORAL | 0 refills | Status: DC

## 2017-11-15 MED ORDER — FLUTICASONE PROPIONATE 50 MCG/ACT NA SUSP: 2.0000 | Freq: Every day | NASAL | 0 refills | Status: DC

## 2017-11-15 NOTE — Progress Notes (Signed)
Medicine attending discharge note: I personally interviewed this patient on the day of discharge and I attest to the accuracy of the discharge evaluation and plan as recorded in the final progress note by resident physician Dr. Katherine Roan.  61 year old woman admitted on November 12, 2017 with underlying obstructive airway disease, oxygen dependent, with associated type III pulmonary artery hypertension.  She presented with an exacerbation of her airway disease precipitated by a viral upper respiratory infection.  Respiratory panel positive for metapneumovirus.  She had a hacking nonproductive cough without fever or leukocytosis.  She was treated symptomatically with oxygen, bronchodilators, and steroids.  At time of discharge, lungs are clear with no rales or wheezes.  She remains afebrile.  Disposition: Condition stable at time of discharge There were no complications

## 2017-11-15 NOTE — Discharge Summary (Signed)
Name: Elizabeth [name] MRN: [id number] DOB: 12-10-1955 61 y.o. PCP: Ledell Noss, MD  Date of Admission: 11/12/2017  3:25 PM Date of Discharge: 11/15/17 Attending Physician: Annia Belt, MD  Discharge Diagnosis: 1. Acute on Chronic respiratory failure with hypoxia 2. COPD Exacerbation 3. Viral URI  Principal Problem:   Acute respiratory failure with hypoxia (HCC) Active Problems:   COPD with acute exacerbation (HCC)   Influenza in adult  Discharge Medications: Allergies as of 11/15/2017   No Known Allergies     Medication List    TAKE these medications   acetaminophen 500 MG tablet Commonly known as:  TYLENOL Take 500 mg by mouth every 6 (six) hours as needed for mild pain.   albuterol 108 (90 Base) MCG/ACT inhaler Commonly known as:  PROVENTIL HFA;VENTOLIN HFA Inhale 2 puffs into the lungs every 6 (six) hours as needed for wheezing or shortness of breath.   albuterol (2.5 MG/3ML) 0.083% nebulizer solution Commonly known as:  PROVENTIL Take 3 mLs (2.5 mg total) by nebulization every 6 (six) hours as needed for wheezing or shortness of breath.   apixaban 5 MG Tabs tablet Commonly known as:  ELIQUIS Take 1 tablet (5 mg total) by mouth 2 (two) times daily.   clopidogrel 75 MG tablet Commonly known as:  PLAVIX Take 1 tablet (75 mg total) daily by mouth.   colchicine 0.6 MG tablet Take 1 tablet (0.6 mg total) by mouth 2 (two) times daily.   colchicine 0.6 MG tablet Take 1 tablet (0.6 mg total) by mouth daily.   fluticasone 50 MCG/ACT nasal spray Commonly known as:  FLONASE Place 2 sprays into both nostrils daily. Start taking on:  11/16/2017   Fluticasone-Umeclidin-Vilant 100-62.5-25 MCG/INH Aepb Commonly known as:  TRELEGY ELLIPTA Inhale 1 puff daily into the lungs.   Fluticasone-Umeclidin-Vilant 100-62.5-25 MCG/INH Aepb Commonly known as:  TRELEGY ELLIPTA Inhale 1 puff into the lungs daily.   metoprolol succinate 50 MG 24 hr tablet Commonly  known as:  TOPROL-XL Take 50 mg by mouth daily. What changed:  Another medication with the same name was removed. Continue taking this medication, and follow the directions you see here.   nitroGLYCERIN 0.4 MG SL tablet Commonly known as:  NITROSTAT Place 1 tablet (0.4 mg total) under the tongue every 5 (five) minutes x 3 doses as needed for chest pain.   pantoprazole 40 MG tablet Commonly known as:  PROTONIX TAKE 1 TABLET(40 MG) BY MOUTH DAILY   potassium chloride SA 20 MEQ tablet Commonly known as:  K-DUR,KLOR-CON Take 1 tablet (20 mEq total) by mouth daily.   predniSONE 20 MG tablet Commonly known as:  DELTASONE Take 2 tablets (40 mg total) by mouth daily with breakfast. Start taking on:  11/16/2017   rosuvastatin 20 MG tablet Commonly known as:  CRESTOR Take 1 tablet (20 mg total) by mouth at bedtime.   torsemide 20 MG tablet Commonly known as:  DEMADEX Take 3 tablets (60 mg total) by mouth daily. OK to take an extra tablet daily as needed for weight gain or edema. What changed:    how much to take  when to take this   traMADol 50 MG tablet Commonly known as:  ULTRAM Take 1 tablet (50 mg total) by mouth every 6 (six) hours as needed for severe pain.            Durable Medical Equipment  (From admission, onward)        Start     Ordered  11/15/17 0000  For home use only DME oxygen    Comments:  Pt is chronically on 2L at rest and 4L with exertion at home  Question Answer Comment  Mode or (Route) Nasal cannula   Liters per Minute 2   Oxygen delivery system Gas      11/15/17 1132      Disposition and follow-up:   [name] was discharged from [facility] in stable condition.  At the hospital follow up visit please address:  1.  Respiratory status: Please ensure resolution of COPD exacerbation and compliance with her home medication regimen.   2.  Labs / imaging needed at time of follow-up: None.  3.  Pending labs/ test  needing follow-up:none.  Follow-up Appointments: Follow-up Information    Ledell Noss, MD Follow up in 1 month(s).   Specialty:  Internal Medicine Why:  Please follow up with your pcp in about one month Contact information: [place] [address] [phone]          Hospital Course by problem list: Principal Problem:   Acute respiratory failure with hypoxia ([place]) Active Problems:   COPD with acute exacerbation ([place])   Influenza in adult   1. Acute on Chronic Respiratory Failure with Hypoxia, COPD Exacerbation due to Viral URI Very pleasant 61 year old female with chronic respiratory failure on 2-4 L supplemental O2 and COPD who was directly admitted from clinic 11/12/17. She presented with a one-week history of worsening shortness of breath, productive cough, increased sputum and color change from clear to yellow. She was hypoxic on her home oxygen while in clinic and was subsequently admitted for treatment of COPD exacerbation. She was treated with prednisone and supportive therapy and had good improvement of her symptoms by hospital day 3, at which time she was back on her home oxygen. Respiratory viral panel was positive for metapneumovirus which is thought to be the trigger for this COPD exacerbation. She was discharged with prednisone to complete a 5 day course.  Discharge Vitals:   BP 120/77   Pulse 86   Temp 97.8 F (36.6 C) (Oral)   Resp 18   Ht 5' 2" (1.575 m)   Wt 216 lb 11.2 oz (98.3 kg)   LMP 12/30/1994   SpO2 95%   BMI 39.63 kg/m   Pertinent Labs, Studies, and Procedures:  RVP: +Metapneumovirus  CXR: Diffuse bilateral pulmonary interstitial prominence consistent with mild CHF  Discharge Instructions: Discharge Instructions    (HEART FAILURE PATIENTS) Call MD:  Anytime you have any of the following symptoms: 1) 3 pound weight gain in 24 hours or 5 pounds in 1 week 2) shortness of breath, with or without a dry hacking cough 3) swelling in the  hands, feet or stomach 4) if you have to sleep on extra pillows at night in order to breathe.   Complete by:  As directed    Call MD for:   Complete by:  As directed    Call MD for:  difficulty breathing, headache or visual disturbances   Complete by:  As directed    Call MD for:  extreme fatigue   Complete by:  As directed    Call MD for:  hives   Complete by:  As directed    Call MD for:  persistant dizziness or light-headedness   Complete by:  As directed    Call MD for:  persistant nausea and vomiting   Complete by:  As directed  Call MD for:  redness, tenderness, or signs of infection (pain, swelling, redness, odor or green/yellow discharge around incision site)   Complete by:  As directed    Call MD for:  severe uncontrolled pain   Complete by:  As directed    Call MD for:  temperature >100.4   Complete by:  As directed    Diet - low sodium heart healthy   Complete by:  As directed    Discharge instructions   Complete by:  As directed    [name], you are doing very well, I'm glad you're feeling better.  You caught a virus which made you short of breath and aggravated your COPD.  You have done much better since your arrival and your cough and SOB have decreased.  Please finish the course of steroids I prescribed and continue to take your other home medicines.  You can follow up with Dr. Hetty Ely in about one month or sooner if something comes up.   For home use only DME oxygen   Complete by:  As directed    Pt is chronically on 2L at rest and 4L with exertion at home   Mode or (Route):  Nasal cannula   Liters per Minute:  2   Oxygen delivery system:  Gas   Increase activity slowly   Complete by:  As directed      Signed: Even Budlong, DO 11/15/2017, 11:32 AM   Pager: [phone]

## 2017-11-15 NOTE — Progress Notes (Signed)
AVS reviewed with pt along with new medication information. IV site was removed and site is WDL. Pt with no apparent distress. Pt taken downstairs via wheelchair accompanied by a [facility] volunteer.

## 2017-11-15 NOTE — Care Management Note (Signed)
Case Management Note  Patient Details  Name: Elizabeth [name] MRN: [id number] Date of Birth: 12/13/1955  Subjective/Objective:                    Action/Plan: Pt discharging home with self care. Pt is oxygen dependent at home. Pt states she uses [facility] for her oxygen and supplies. Pt states her sister is going to provide transportation home today and she will bring an oxygen tank for transport.  Pt has insurance, PCP. No further needs per CM.   Expected Discharge Date:  11/15/17               Expected Discharge Plan:  Home/Self Care  In-House Referral:     Discharge planning Services  CM Consult  Post Acute Care Choice:    Choice offered to:     DME Arranged:    DME Agency:     HH Arranged:    HH Agency:     Status of Service:  Completed, signed off  If discussed at [employer] of Stay Meetings, dates discussed:    Additional Comments:  Pollie Friar, RN 11/15/2017, 12:12 PM

## 2017-11-15 NOTE — Telephone Encounter (Signed)
-----  Message from Sanda Klein, MD sent at 11/13/2017 11:07 PM EST ----- Regarding: RE: Cardiac rehab Ok to wear and titrate O2.  Ok to exercise if resting O2 92% and Exercise O2 88% or higher.  Thanks MCr ----- Message ----- From: Rowe Pavy, RN Sent: 11/12/2017   9:20 AM To: Sanda Klein, MD Subject: Cardiac rehab                                  Dr. Sallyanne Kuster  Thank you for signing the [facility] reimbursement form for this pt s/p DES to [facility] ([facility]).  Pt is interested and will begin exercise in February. Pt wears O2 at 2lnc.    May pt continue to wear her O2 during exercise and titrate prn?  What O2 saturation is appropriate for pt to maintain?   Thanks for the advisement Cherre Huger, BSN Cardiac and Pulmonary Rehab Nurse Navigator

## 2017-11-15 NOTE — Progress Notes (Signed)
   Subjective: Patient says she feels well today, reports her cough is continuing to improve.  She feels less short of breath today.   Objective:  Vital signs in last 24 hours: Vitals:   11/14/17 0606 11/14/17 0847 11/15/17 0700 11/15/17 0923  BP: 108/74   120/77  Pulse: 81   86  Resp: 18     Temp: 97.8 F (36.6 C)     TempSrc: Oral     SpO2: 95% 95%    Weight: 218 lb 14.4 oz (99.3 kg)  216 lb 11.2 oz (98.3 kg)   Height:       Physical Exam  Constitutional: She is oriented to person, place, and time. No distress.  HENT:  Head: Normocephalic and atraumatic.  Eyes: Right eye exhibits no discharge. Left eye exhibits no discharge.  Neck: Normal range of motion. Neck supple. No JVD present.  Cardiovascular: Normal rate, regular rhythm and normal heart sounds. Exam reveals no gallop and no friction rub.  No murmur heard. Pulmonary/Chest: Effort normal and breath sounds normal. No respiratory distress. She has no wheezes. She has no rales. She exhibits no tenderness. Pt had coughing fit during exam.   Abdominal: Soft. Bowel sounds are normal. She exhibits no distension and no mass. There is no tenderness. There is no rebound and no guarding.  Musculoskeletal: She exhibits no edema.  Neurological: She is alert and oriented to person, place, and time.  Skin: She is not diaphoretic. No erythema. No pallor.  Psychiatric: Her mood appears normal    Assessment/Plan:  Principal Problem:   Acute respiratory failure with hypoxia (HCC) Active Problems:   COPD with acute exacerbation (HCC)   Influenza in adult  acute on chronic COPD: on 2L at rest and 4L with exertion at home also has type 3 PAH as well seen on RHC and ECHO. Required 3L at rest in clinic.  Reports what sounds like viral URI symptoms.  Does say had a change in sputum color just before admission.     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  Now on metoprolol succinate 5039meliquis 5mg23mD, in sinus this admission  -continue home meds  Dispo: Anticipated discharge home later this afternoon.   WinfKatherine Roan 11/15/2017, 11:33 AM [facility] MuffPGY-1 Internal Medicine Pager # [phone]

## 2017-11-17 ENCOUNTER — Other Ambulatory Visit: Payer: Self-pay | Admitting: *Deleted

## 2017-11-18 DIAGNOSIS — J449 Chronic obstructive pulmonary disease, unspecified: Secondary | ICD-10-CM | POA: Diagnosis not present

## 2017-11-18 DIAGNOSIS — R911 Solitary pulmonary nodule: Secondary | ICD-10-CM | POA: Diagnosis not present

## 2017-11-19 NOTE — Procedures (Signed)
Patient Name: Elizabeth [name], Elizabeth [name] Date: 10/31/2017 Gender: Female D.O.B: 1956-10-30 Age (years): 61 Referring Provider: Tammy Parrett Height (inches): 62 Interpreting Physician: Baird Lyons MD, ABSM Weight (lbs): 219 RPSGT: Jorge Ny BMI: 40 MRN: [id number] Neck Size: 13.50 <br> <br> CLINICAL INFORMATION Sleep Study Type: NPSG  Indication for sleep study: Congestive Heart Failure, COPD, Fatigue, Hypertension, Obesity, OSA  Epworth Sleepiness Score: 10  SLEEP STUDY TECHNIQUE As per the AASM Manual for the Scoring of Sleep and Associated Events v2.3 (April 2016) with a hypopnea requiring 4% desaturations.  The channels recorded and monitored were frontal, central and occipital EEG, electrooculogram (EOG), submentalis EMG (chin), nasal and oral airflow, thoracic and abdominal wall motion, anterior tibialis EMG, snore microphone, electrocardiogram, and pulse oximetry.  MEDICATIONS Medications self-administered by patient taken the night of the study : none reported  SLEEP ARCHITECTURE The study was initiated at 10:46:10 PM and ended at 4:53:55 AM.  Sleep onset time was 153.5 minutes and the sleep efficiency was 35.8%. The total sleep time was 131.5 minutes.  Stage REM latency was 92.0 minutes.  The patient spent 6.08% of the night in stage N1 sleep, 66.16% in stage N2 sleep, 0.00% in stage N3 and 27.76% in REM.  Alpha intrusion was absent.  Supine sleep was 6.84%.  RESPIRATORY PARAMETERS The overall apnea/hypopnea index (AHI) was 11.9 per hour. There were 2 total apneas, including 2 obstructive, 0 central and 0 mixed apneas. There were 24 hypopneas and 4 RERAs.  The AHI during Stage REM sleep was 24.7 per hour.  AHI while supine was 26.7 per hour.  The mean oxygen saturation was 93.13%. The minimum SpO2 during sleep was 87.00%. The patient slept with O2 3L as at home, per protocol.  snoring was noted during this study.  CARDIAC DATA The 2 lead EKG  demonstrated sinus rhythm. The mean heart rate was 79.80 beats per minute. Other EKG findings include: PVCs.  LEG MOVEMENT DATA The total PLMS were 170 with a resulting PLMS index of 77.57. Associated arousal with leg movement index was 25.1 .  IMPRESSIONS - Mild obstructive sleep apnea occurred during this study (AHI = 11.9/h). - Sleep onset was significantly delayed. There were insufficient early sleep and events to meet protocol requirements for split CPAP titration.  - No significant central sleep apnea occurred during this study (CAI = 0.0/h). - Oxygen desaturation was noted during this study (Min O2 = 87.00%, Mean 93.1%, sleeping with O2 3L as at home.). - No snoring was audible during this study. - EKG findings include PVCs. - Severe periodic limb movements of sleep occurred during the study. Associated arousals were significant.  DIAGNOSIS - Obstructive Sleep Apnea (327.23 [G47.33 ICD-10]) - Nocturnal Hypoxemia (327.26 [G47.36 ICD-10]) - Periodic Limb Movement sleep disorder  RECOMMENDATIONS - Therapeutic CPAP titration to determine optimal pressure required to alleviate sleep disordered breathing. - Positional therapy avoiding supine position during sleep. - Sleep hygiene should be reviewed to assess factors that may improve sleep quality. - Weight management and regular exercise should be initiated or continued if appropriate.  [Electronically signed] 11/13/2017 12:49 PM  Baird Lyons MD, [place], American Board of Sleep Medicine   NPI: [id number]                         [place] [place] [facility]  ELECTRONICALLY SIGNED ON:  11/19/2017, 3:06 PM [place]: [phone]   FX: [phone] ACCREDITED  BY THE AMERICAN ACADEMY OF SLEEP MEDICINE

## 2017-11-22 ENCOUNTER — Other Ambulatory Visit: Payer: Self-pay | Admitting: Adult Health

## 2017-11-22 ENCOUNTER — Telehealth: Payer: Self-pay | Admitting: Adult Health

## 2017-11-22 DIAGNOSIS — I5032 Chronic diastolic (congestive) heart failure: Secondary | ICD-10-CM

## 2017-11-22 DIAGNOSIS — G4733 Obstructive sleep apnea (adult) (pediatric): Secondary | ICD-10-CM

## 2017-11-22 DIAGNOSIS — I272 Pulmonary hypertension, unspecified: Secondary | ICD-10-CM

## 2017-11-22 NOTE — Telephone Encounter (Signed)
Sleep study shows mild OSA -(AHI) was 11.9 per hour. Would begin CPAP At bedtime  (hx of CHF, CAD and PUlmonary HTN )   Begin CPAP 5 to 15cmH2O, mask of choice, airview , heated humidity .  Ov in 6-8 weeks with download   Please contact office for sooner follow up if symptoms do not improve or worsen or seek emergency care

## 2017-11-22 NOTE — Telephone Encounter (Signed)
DME: [place] [facility]  Order placed for new CPAP start. Patient notified of recommendations. Nothing further needed.

## 2017-11-23 ENCOUNTER — Other Ambulatory Visit: Payer: Self-pay

## 2017-11-23 ENCOUNTER — Encounter: Payer: Self-pay | Admitting: Internal Medicine

## 2017-11-23 ENCOUNTER — Ambulatory Visit (INDEPENDENT_AMBULATORY_CARE_PROVIDER_SITE_OTHER): Payer: Medicare Other | Admitting: Internal Medicine

## 2017-11-23 DIAGNOSIS — Z7901 Long term (current) use of anticoagulants: Secondary | ICD-10-CM

## 2017-11-23 DIAGNOSIS — G4700 Insomnia, unspecified: Secondary | ICD-10-CM | POA: Diagnosis not present

## 2017-11-23 DIAGNOSIS — F5101 Primary insomnia: Secondary | ICD-10-CM

## 2017-11-23 DIAGNOSIS — I5032 Chronic diastolic (congestive) heart failure: Secondary | ICD-10-CM

## 2017-11-23 DIAGNOSIS — Z8673 Personal history of transient ischemic attack (TIA), and cerebral infarction without residual deficits: Secondary | ICD-10-CM

## 2017-11-23 DIAGNOSIS — I2510 Atherosclerotic heart disease of native coronary artery without angina pectoris: Secondary | ICD-10-CM | POA: Diagnosis not present

## 2017-11-23 DIAGNOSIS — E785 Hyperlipidemia, unspecified: Secondary | ICD-10-CM

## 2017-11-23 DIAGNOSIS — F329 Major depressive disorder, single episode, unspecified: Secondary | ICD-10-CM

## 2017-11-23 DIAGNOSIS — K219 Gastro-esophageal reflux disease without esophagitis: Secondary | ICD-10-CM | POA: Diagnosis not present

## 2017-11-23 DIAGNOSIS — I110 Hypertensive heart disease with heart failure: Secondary | ICD-10-CM

## 2017-11-23 DIAGNOSIS — I480 Paroxysmal atrial fibrillation: Secondary | ICD-10-CM

## 2017-11-23 DIAGNOSIS — Z951 Presence of aortocoronary bypass graft: Secondary | ICD-10-CM

## 2017-11-23 DIAGNOSIS — Z9981 Dependence on supplemental oxygen: Secondary | ICD-10-CM

## 2017-11-23 DIAGNOSIS — Z87891 Personal history of nicotine dependence: Secondary | ICD-10-CM

## 2017-11-23 DIAGNOSIS — J449 Chronic obstructive pulmonary disease, unspecified: Secondary | ICD-10-CM | POA: Diagnosis not present

## 2017-11-23 DIAGNOSIS — R918 Other nonspecific abnormal finding of lung field: Secondary | ICD-10-CM

## 2017-11-23 MED ORDER — TRAZODONE HCL 50 MG PO TABS: 50.0000 mg | ORAL_TABLET | Freq: Every day | ORAL | 2 refills | Status: DC

## 2017-11-23 NOTE — Assessment & Plan Note (Signed)
Expresses concern for difficulties with sleeping. Screened for sleep hygienge - she denies taking naps, drinking caffeine beside a morning cup of coffee. She goes to bed at 10 pm then wakes up at 2 am every night. Although sleep study revealed severe periodic limb movements with associated significant arousal she does not believe that restless legs are what wake her up in the middle of the night. She has tried melatonin in the past, it did not work for her at all. She request Lorrin Mais, she tried this while inpatient and had good result but had some episodes which suggest she was sleep walking while taking this in the past.  - trial of trazodone - if this is not successful, would lean towards trying pramipexole for possible RLS

## 2017-11-23 NOTE — Patient Instructions (Addendum)
It was a pleasure to see you today [name]  - For your difficulty sleeping, try taking trazodone and dont forget to go for your sleep study  - For your cholesterol, try using the rosuvastatin, if this gives you any trouble please give myself of the cardiologist a call and I will change to a different prescription to help with your cholesterol  - Please call our clinic if you have any problems or questions, we may be able to help you and keep you from a long emergency room wait. Our clinic and after hours phone number is [phone]   FOLLOW-UP INSTRUCTIONS When: 3-6 months  For: difficulty sleeping  What to bring: medication bottles         Mediterranean Diet  Why follow it? Research shows. . Those who follow the Mediterranean diet have a reduced risk of heart disease  . The diet is associated with a reduced incidence of Parkinson's and Alzheimer's diseases . People following the diet may have longer life expectancies and lower rates of chronic diseases  . The Dietary Guidelines for Americans recommends the Mediterranean diet as an eating plan to promote health and prevent disease  What Is the Mediterranean Diet?  . Healthy eating plan based on typical foods and recipes of Mediterranean-style cooking . The diet is primarily a plant based diet; these foods should make up a majority of meals   Starches - Plant based foods should make up a majority of meals - They are an important sources of vitamins, minerals, energy, antioxidants, and fiber - Choose whole grains, foods high in fiber and minimally processed items  - Typical grain sources include wheat, oats, barley, corn, brown rice, bulgar, farro, millet, polenta, couscous  - Various types of beans include chickpeas, lentils, fava beans, black beans, white beans   Fruits  Veggies - Large quantities of antioxidant rich fruits & veggies; 6 or more servings  - Vegetables can be eaten raw or lightly drizzled with oil and cooked  -  Vegetables common to the traditional Mediterranean Diet include: artichokes, arugula, beets, broccoli, brussel sprouts, cabbage, carrots, celery, collard greens, cucumbers, eggplant, kale, leeks, lemons, lettuce, mushrooms, okra, onions, peas, peppers, potatoes, pumpkin, radishes, rutabaga, shallots, spinach, sweet potatoes, turnips, zucchini - Fruits common to the Mediterranean Diet include: apples, apricots, avocados, cherries, clementines, dates, figs, grapefruits, grapes, melons, nectarines, oranges, peaches, pears, pomegranates, strawberries, tangerines  Fats - Replace butter and margarine with healthy oils, such as olive oil, canola oil, and tahini  - Limit nuts to no more than a handful a day  - Nuts include walnuts, almonds, pecans, pistachios, pine nuts  - Limit or avoid candied, honey roasted or heavily salted nuts - Olives are central to the [employer] - can be eaten whole or used in a variety of dishes   Meats Protein - Limiting red meat: no more than a few times a month - When eating red meat: choose lean cuts and keep the portion to the size of deck of cards - Eggs: approx. 0 to 4 times a week  - Fish and lean poultry: at least 2 a week  - Healthy protein sources include, chicken, [place], lean beef, lamb - Increase intake of seafood such as tuna, salmon, trout, mackerel, shrimp, scallops - Avoid or limit high fat processed meats such as sausage and bacon  Dairy - Include moderate amounts of low fat dairy products  - Focus on healthy dairy such as fat free yogurt, skim milk, low or  reduced fat cheese - Limit dairy products higher in fat such as whole or 2% milk, cheese, ice cream  Alcohol - Moderate amounts of red wine is ok  - No more than 5 oz daily for women (all ages) and men older than age 68  - No more than 10 oz of wine daily for men younger than 55  Other - Limit sweets and other desserts  - Use herbs and spices instead of salt to flavor foods  - Herbs and spices  common to the traditional Mediterranean Diet include: basil, bay leaves, chives, cloves, cumin, fennel, garlic, lavender, marjoram, mint, oregano, parsley, pepper, rosemary, sage, savory, sumac, tarragon, thyme   It's not just a diet, it's a lifestyle:  . The Mediterranean diet includes lifestyle factors typical of those in the region  . Foods, drinks and meals are best eaten with others and savored . Daily physical activity is important for overall good health . This could be strenuous exercise like running and aerobics . This could also be more leisurely activities such as walking, housework, yard-work, or taking the stairs . Moderation is the key; a balanced and healthy diet accommodates most foods and drinks . Consider portion sizes and frequency of consumption of certain foods   Meal Ideas & Options:  . Breakfast:  o Whole wheat toast or whole wheat English muffins with peanut butter & hard boiled egg o Steel cut oats topped with apples & cinnamon and skim milk  o Fresh fruit: banana, strawberries, melon, berries, peaches  o Smoothies: strawberries, bananas, greek yogurt, peanut butter o Low fat greek yogurt with blueberries and granola  o Egg white omelet with spinach and mushrooms o Breakfast couscous: whole wheat couscous, apricots, skim milk, cranberries  . Sandwiches:  o Hummus and grilled vegetables (peppers, zucchini, squash) on whole wheat bread   o Grilled chicken on whole wheat pita with lettuce, tomatoes, cucumbers or tzatziki  o Tuna salad on whole wheat bread: tuna salad made with greek yogurt, olives, red peppers, capers, green onions o Garlic rosemary lamb pita: lamb sauted with garlic, rosemary, salt & pepper; add lettuce, cucumber, greek yogurt to pita - flavor with lemon juice and black pepper  . Seafood:  o Mediterranean grilled salmon, seasoned with garlic, basil, parsley, lemon juice and black pepper o Shrimp, lemon, and spinach whole-grain pasta salad made with  low fat greek yogurt  o Seared scallops with lemon orzo  o Seared tuna steaks seasoned salt, pepper, coriander topped with tomato mixture of olives, tomatoes, olive oil, minced garlic, parsley, green onions and cappers  . Meats:  o Herbed greek chicken salad with kalamata olives, cucumber, feta  o Red bell peppers stuffed with spinach, bulgur, lean ground beef (or lentils) & topped with feta   o Kebabs: skewers of chicken, tomatoes, onions, zucchini, squash  o [place] burgers: made with red onions, mint, dill, lemon juice, feta cheese topped with roasted red peppers . Vegetarian o Cucumber salad: cucumbers, artichoke hearts, celery, red onion, feta cheese, tossed in olive oil & lemon juice  o Hummus and whole grain pita points with a greek salad (lettuce, tomato, feta, olives, cucumbers, red onion) o Lentil soup with celery, carrots made with vegetable broth, garlic, salt and pepper  o Tabouli salad: parsley, bulgur, mint, scallions, cucumbers, tomato, radishes, lemon juice, olive oil, salt and pepper.

## 2017-11-23 NOTE — Progress Notes (Signed)
   CC: Follow up after hospitalization for COPD exacerbation   HPI:  [name] is a 62 y.o. with PMH paroxysmal atrial fibrillation (on eliquis), chronic diastolic heart failure (7/32 EF 40-45%, G2DD) , coronary artery disease (s/p CABGx4), COPD (on 2 L of oxygen at home) ,GERD, hyperlipidemia, hypertension, depression, pulmonary nodules, and CVA who presents for follow up after hospitalization for COPD exacerbation. Please see the assessment and plans for the status of the patient chronic medical problems.   Past Medical History:  Diagnosis Date  . CAD (coronary artery disease)    NSTEMI 08/2011:  LHC 08/21/11: mLAD 60-70%, pCFX occluded, dRCA chronic occlusion with L-R collats, EF 40-45%, inf AK.  PCI:  BMS to CFX.  Marland Kitchen CHF (congestive heart failure) ([place]) 05/31/2017  . COPD (chronic obstructive pulmonary disease) ([place]) 11/17/2016  . Depression   . Fibroids   . GERD (gastroesophageal reflux disease)   . Gout   . HLD (hyperlipidemia)    Chol = 235, LDL = 156 (08/2010)  . Hypertension   . Lupus 2009   ANA + 10/2008, repeat ANA + (05/2009), on that visit 05/2009 following labs obtained RF <20, CRP <0.4, ANA titers 1:80,   . On home oxygen therapy    "2L; 24/7 (05/31/2017)  . Pulmonary nodules 05/2008   noted on CXR and CT 05/2008, repeat CT 10/2008 - Stable small bilateral pulmonary nodules measuring up to 6 m m  . Stroke [facility]) 04/2014   "mini stroke"; denies residual on 05/31/2017  . Toe pain, right 10/08/2017   Review of Systems:  Refer to history of present illness and assessment and plans for pertinent review of systems, all others reviewed and negative  Physical Exam:  Vitals:   11/23/17 1515  BP: 135/80  Pulse: 72  Temp: 98 F (36.7 C)  TempSrc: Oral  SpO2: 95%  Weight: 220 lb 12.8 oz (100.2 kg)  Height: _0  (1.575 m)    General: no acute distress, voice is hoarse  Cardiac: RRR, no murmur appreciated, no peripheral edema  Pulm: no respiratory distress,  speaking in full sentences, lungs clear to auscultation bilateral  Abdomen: soft, nontender  Assessment & Plan:   COPD  [name] was hospitalized 12/28 - 12/31 for a COPD exacerbation due to metapneumoviral URI after she present to the clinic with hypoxia on her home O2. She was treated with prednisone and duoneb and discharged with a rx to complete a 5 day course of prednisone. Today she feels that her breathing is back to baseline.  - encouraged use of trelegy daily and albuterol PRN   Insomnia  Expresses concern for difficulties with sleeping. Screened for sleep hygienge - she denies taking naps, drinking caffeine beside a morning cup of coffee. She goes to bed at 10 pm then wakes up at 2 am every night. Although sleep study revealed severe periodic limb movements with associated significant arousal she does not believe that restless legs are what wake her up in the middle of the night. She has tried melatonin in the past, it did not work for her at all. She request Lorrin Mais, she tried this while inpatient and had good result but had some episodes which suggest she was sleep walking while taking this in the past.  - trial of trazodone - if this is not successful, would lean towards trying pramipexole for possible RLS   See Encounters Tab for problem based charting.  Patient discussed with Dr. Daryll Drown

## 2017-11-23 NOTE — Assessment & Plan Note (Signed)
Elizabeth [name] was hospitalized 12/28 - 12/31 for a COPD exacerbation due to metapneumoviral URI after she present to the clinic with hypoxia on her home O2. She was treated with prednisone and duoneb and discharged with a rx to complete a 5 day course of prednisone. Today she feels that her breathing is back to baseline.  - encouraged use of trelegy daily and albuterol PRN

## 2017-11-29 DIAGNOSIS — M329 Systemic lupus erythematosus, unspecified: Secondary | ICD-10-CM | POA: Diagnosis not present

## 2017-11-29 DIAGNOSIS — J449 Chronic obstructive pulmonary disease, unspecified: Secondary | ICD-10-CM | POA: Diagnosis not present

## 2017-11-30 ENCOUNTER — Encounter: Payer: Self-pay | Admitting: Pulmonary Disease

## 2017-11-30 ENCOUNTER — Ambulatory Visit (INDEPENDENT_AMBULATORY_CARE_PROVIDER_SITE_OTHER)
Admission: RE | Admit: 2017-11-30 | Discharge: 2017-11-30 | Disposition: A | Discharge status: Home / Self Care | Payer: Medicare Other | Source: Ambulatory Visit | Attending: Pulmonary Disease | Admitting: Pulmonary Disease

## 2017-11-30 ENCOUNTER — Ambulatory Visit (INDEPENDENT_AMBULATORY_CARE_PROVIDER_SITE_OTHER): Payer: Medicare Other | Admitting: Pulmonary Disease

## 2017-11-30 VITALS — BP 126/62 | HR 89 | Ht 62.0 in | Wt 220.8 lb

## 2017-11-30 DIAGNOSIS — R05 Cough: Secondary | ICD-10-CM | POA: Diagnosis not present

## 2017-11-30 DIAGNOSIS — J449 Chronic obstructive pulmonary disease, unspecified: Secondary | ICD-10-CM | POA: Diagnosis not present

## 2017-11-30 DIAGNOSIS — I272 Pulmonary hypertension, unspecified: Secondary | ICD-10-CM | POA: Diagnosis not present

## 2017-11-30 DIAGNOSIS — R0602 Shortness of breath: Secondary | ICD-10-CM

## 2017-11-30 DIAGNOSIS — J441 Chronic obstructive pulmonary disease with (acute) exacerbation: Secondary | ICD-10-CM

## 2017-11-30 DIAGNOSIS — G4733 Obstructive sleep apnea (adult) (pediatric): Secondary | ICD-10-CM | POA: Diagnosis not present

## 2017-11-30 MED ORDER — ROFLUMILAST 500 MCG PO TABS: 500.0000 ug | ORAL_TABLET | Freq: Every day | ORAL | 11 refills | Status: AC

## 2017-11-30 MED ORDER — IPRATROPIUM-ALBUTEROL 0.5-2.5 (3) MG/3ML IN SOLN: 3.0000 mL | Freq: Four times a day (QID) | RESPIRATORY_TRACT | 2 refills | Status: AC | PRN

## 2017-11-30 NOTE — Progress Notes (Addendum)
Elizabeth [name]    [id number]    November 01, 1956  Primary Care Physician:Blum, Gae Bon, MD  Referring Physician: Ledell Noss, MD [place], [place] [address]  Chief complaint:  Follow up for evaluation of COPD GOLD D (CAT score 31, FEV1 8%)  HPI: 62 year old with past medical history of lupus, heart failure, coronary artery disease s/p CABG in July 2017 paroxysmal A. fib, COPD, hypertension. She complaints of ongoing dyspnea on exertion since the CABG surgery. She was sent home at that time on home oxygen which she weaned herself off. However she has worsening of her hypoxia and was restarted on 2 L 24/7 home oxygen in January She was admitted in early January 2018 for acute on chronic respiratory failure secondary to flu a infection and heart failure.  Interim History: Hospitalized in December for COPD exacerbation and setting of Metapneumovirus infection. Continues on trelegy inhaler and supplemental oxygen.  Since her discharge she still reports dyspnea on exertion, occasional cough with wheezing.  Denies any fevers, chills, hemoptysis.  Outpatient Encounter Medications as of 11/30/2017  Medication Sig  . acetaminophen (TYLENOL) 500 MG tablet Take 500 mg by mouth every 6 (six) hours as needed for mild pain.  Marland Kitchen albuterol (PROVENTIL HFA;VENTOLIN HFA) 108 (90 Base) MCG/ACT inhaler Inhale 2 puffs into the lungs every 6 (six) hours as needed for wheezing or shortness of breath.  Marland Kitchen albuterol (PROVENTIL) (2.5 MG/3ML) 0.083% nebulizer solution Take 3 mLs (2.5 mg total) by nebulization every 6 (six) hours as needed for wheezing or shortness of breath.  Marland Kitchen apixaban (ELIQUIS) 5 MG TABS tablet Take 1 tablet (5 mg total) by mouth 2 (two) times daily.  . clopidogrel (PLAVIX) 75 MG tablet Take 1 tablet (75 mg total) daily by mouth.  . colchicine 0.6 MG tablet Take 1 tablet (0.6 mg total) by mouth daily.  . fluticasone (FLONASE) 50 MCG/ACT nasal spray SHAKE LIQUID AND USE 2 SPRAYS IN  EACH NOSTRIL DAILY  . Fluticasone-Umeclidin-Vilant (TRELEGY ELLIPTA) 100-62.5-25 MCG/INH AEPB Inhale 1 puff into the lungs daily.  . metoprolol succinate (TOPROL-XL) 50 MG 24 hr tablet Take 50 mg by mouth daily.  . nitroGLYCERIN (NITROSTAT) 0.4 MG SL tablet Place 1 tablet (0.4 mg total) under the tongue every 5 (five) minutes x 3 doses as needed for chest pain.  . pantoprazole (PROTONIX) 40 MG tablet TAKE 1 TABLET(40 MG) BY MOUTH DAILY  . potassium chloride SA (K-DUR,KLOR-CON) 20 MEQ tablet Take 1 tablet (20 mEq total) by mouth daily.  . rosuvastatin (CRESTOR) 20 MG tablet Take 1 tablet (20 mg total) by mouth at bedtime.  . torsemide (DEMADEX) 20 MG tablet Take 3 tablets (60 mg total) by mouth daily. OK to take an extra tablet daily as needed for weight gain or edema.  . traZODone (DESYREL) 50 MG tablet Take 1 tablet (50 mg total) by mouth at bedtime.  . [DISCONTINUED] colchicine 0.6 MG tablet Take 1 tablet (0.6 mg total) by mouth 2 (two) times daily.  . [DISCONTINUED] Fluticasone-Umeclidin-Vilant (TRELEGY ELLIPTA) 100-62.5-25 MCG/INH AEPB Inhale 1 puff daily into the lungs.  . [DISCONTINUED] predniSONE (DELTASONE) 20 MG tablet Take 2 tablets (40 mg total) by mouth daily with breakfast.   No facility-administered encounter medications on file as of 11/30/2017.     Allergies as of 11/30/2017  . (No Known Allergies)    Past Medical History:  Diagnosis Date  . CAD (coronary artery disease)    NSTEMI 08/2011:  LHC 08/21/11:  mLAD 60-70%, pCFX occluded, dRCA chronic occlusion with L-R collats, EF 40-45%, inf AK.  PCI:  BMS to CFX.  Marland Kitchen CHF (congestive heart failure) ([place]) 05/31/2017  . COPD (chronic obstructive pulmonary disease) ([place]) 11/17/2016  . Depression   . Fibroids   . GERD (gastroesophageal reflux disease)   . Gout   . HLD (hyperlipidemia)    Chol = 235, LDL = 156 (08/2010)  . Hypertension   . Lupus 2009   ANA + 10/2008, repeat ANA + (05/2009), on that visit 05/2009 following labs  obtained RF <20, CRP <0.4, ANA titers 1:80,   . On home oxygen therapy    "2L; 24/7 (05/31/2017)  . Pulmonary nodules 05/2008   noted on CXR and CT 05/2008, repeat CT 10/2008 - Stable small bilateral pulmonary nodules measuring up to 6 m m  . Stroke [facility]) 04/2014   "mini stroke"; denies residual on 05/31/2017  . Toe pain, right 10/08/2017    Past Surgical History:  Procedure Laterality Date  . CARDIAC CATHETERIZATION N/A 06/02/2016   Procedure: Left Heart Cath and Coronary Angiography;  Surgeon: Belva Crome, MD;  Location: [facility];  Service: Cardiovascular;  Laterality: N/A;  . COLONOSCOPY N/A 07/14/2013   Procedure: COLONOSCOPY;  Surgeon: Beryle Beams, MD;  Location: [facility];  Service: Endoscopy;  Laterality: N/A;  . CORONARY ANGIOPLASTY WITH STENT PLACEMENT    . CORONARY ARTERY BYPASS GRAFT N/A 06/08/2016   Procedure: CORONARY ARTERY BYPASS GRAFTING (CABG) X 4 Diagnostic Diagram (LIMA-LAD, SVG-OM-CFX, SVG-RCA) With Right Greater Saphenous vein endoscopic harvesting;  Surgeon: [name], MD;  Location: [facility];  Service: Open Heart Surgery;  Laterality: N/A;  . CORONARY STENT INTERVENTION N/A 09/22/2017   Procedure: CORONARY STENT INTERVENTION;  Surgeon: Martinique, Peter M, MD;  Location: [facility];  Service: Cardiovascular;  Laterality: N/A;  . FRACTURE SURGERY    . ORIF TIBIA PLATEAU Left 08/22/2013   Procedure: OPEN REDUCTION INTERNAL FIXATION (ORIF) TIBIAL PLATEAU;  Surgeon: Renette Butters, MD;  Location: [facility];  Service: Orthopedics;  Laterality: Left;  . RIGHT/LEFT HEART CATH AND CORONARY/GRAFT ANGIOGRAPHY N/A 09/20/2017   Procedure: RIGHT/LEFT HEART CATH AND CORONARY/GRAFT ANGIOGRAPHY;  Surgeon: Leonie Man, MD;  Location: [facility];  Service: Cardiovascular;  Laterality: N/A;  . TEE WITHOUT CARDIOVERSION N/A 06/08/2016   Procedure: TRANSESOPHAGEAL ECHOCARDIOGRAM (TEE);  Surgeon: [name], MD;  Location: [facility];  Service: Open Heart  Surgery;  Laterality: N/A;  . TUBAL LIGATION    . VAGINAL HYSTERECTOMY  05/2003    Family History  Problem Relation Age of Onset  . Heart disease Mother   . Cervical cancer Sister   . Prostate cancer Brother   . Colon cancer Neg Hx     Social History   Socioeconomic History  . Marital status: Divorced    Spouse name: Not on file  . Number of children: Not on file  . Years of education: Not on file  . Highest education level: Not on file  Social Needs  . Financial resource strain: Not on file  . Food insecurity - worry: Not on file  . Food insecurity - inability: Not on file  . Transportation needs - medical: Not on file  . Transportation needs - non-medical: Not on file  Occupational History  . Occupation: Retired  Tobacco Use  . Smoking status: Former Smoker    Packs/day: 0.12    Years: 45.00    Pack years: 5.40  Types: Cigarettes    Last attempt to quit: 11/16/2016    Years since quitting: 1.0  . Smokeless tobacco: Never Used  Substance and Sexual Activity  . Alcohol use: Yes    Alcohol/week: 8.4 oz    Types: 7 Cans of beer, 7 Standard drinks or equivalent per week    Comment: 1 beer/day  . Drug use: No    Comment: 05/31/2017 "nothing since ~ 2016"  . Sexual activity: No  Other Topics Concern  . Not on file  Social History Narrative   Pt lives with sister and 2 nieces.   Review of systems: Review of Systems  Constitutional: Negative for fever and chills.  HENT: Negative.   Eyes: Negative for blurred vision.  Respiratory: as per HPI  Cardiovascular: Negative for chest pain and palpitations.  Gastrointestinal: Negative for vomiting, diarrhea, blood per rectum. Genitourinary: Negative for dysuria, urgency, frequency and hematuria.  Musculoskeletal: Negative for myalgias, back pain and joint pain.  Skin: Negative for itching and rash.  Neurological: Negative for dizziness, tremors, focal weakness, seizures and loss of consciousness.  Endo/Heme/Allergies:  Negative for environmental allergies.  Psychiatric/Behavioral: Negative for depression, suicidal ideas and hallucinations.  All other systems reviewed and are negative.  Physical Exam: Blood pressure 126/62, pulse 89, height _0  (1.575 m), weight 220 lb 12.8 oz (100.2 kg), last menstrual period 12/30/1994, SpO2 96 %. Gen:      No acute distress HEENT:  EOMI, sclera anicteric Neck:     No masses; no thyromegaly Lungs:    Clear to auscultation bilaterally; normal respiratory effort CV:         Regular rate and rhythm; no murmurs Abd:      + bowel sounds; soft, non-tender; no palpable masses, no distension Ext:    No edema; adequate peripheral perfusion Skin:      Warm and dry; no rash Neuro: alert and oriented x 3 Psych: normal mood and affect  Data Reviewed: PFTs 06/04/16 FVC 1.89 (76%), FEV1 1.36 (69%),F/F 72,TLC 78%, DLCO 44% Moderate obstruction, minimal restriction, severe diffusion defect  CT scan 11/24/11-stable subcentimeter pulmonary nodules compared to 2009 Chest x-ray 11/26/16-no pneumonia, effusion. Probable mild chronic fibrotic changes. CT high resolution 03/11/17-no interstitial lung disease, moderate emphysema, enlarged pulmonary trunk, aortic atherosclerosis. Multiple small pulmonary nodules stable since 2013. CT angiogram 09/16/17-diffuse emphysematous changes, stable pulmonary nodules.  No interstitial lung disease. Chest x-ray 11/12/17-bilateral interstitial opacities consistent with CHF. I reviewed all images personally  Sleep study 10/31/17 mild OSA -(AHI) was 11.9 per hour.  Assessment:  COPD GOLD D Continue on trelegy.  Continue albuterol as needed.  Start duo nebs every 6 hrs PRN Continue supplemental oxygen She could not complete rehab due to recent hospitalization and is planning on resuming in february Start Daliresp as she has frequent exacerbations.  H/O Lupus She has history of lupus which appears to have been confined to the skin previously.  CT scan  of the chest does not show any ILD. We will continue to monitor.    Former smoker Has stopped smoking since January 2018.   SubCm pulmonary nodules Likely benign as they have been stable sine 2013.  She will need to start low-dose screening CT of the chest starting in Nov 2019 (1 year from last CT scan)  Obstructive sleep apnea AutoSet CPAP ordered.  Awaiting initiation of therapy.  Plan/Recommendations: - Continue O2, trelegy - Start duonebs PRN - Autoset CPAP  Return in 6 months  Marshell Garfinkel MD [facility] Pager [phone] 11/30/2017, 9:58 AM  CC: Ledell Noss, MD

## 2017-11-30 NOTE — Progress Notes (Signed)
Internal Medicine Clinic Attending  Case discussed with Dr. Blum at the time of the visit.  We reviewed the resident's history and exam and pertinent patient test results.  I agree with the assessment, diagnosis, and plan of care documented in the resident's note. 

## 2017-11-30 NOTE — Patient Instructions (Addendum)
We have ordered a CPAP for you.  Will make sure that this gets delivered so that he can start treatment as soon as possible Continue the trelegy inhaler.  Will add duo nebs via nebulizer regimen.  Use up to 4 times a day as needed We will get a chest x-ray today to make sure your lungs are clear We will start daliresp and refer to low dose screening CT of chest Follow-up in 1-2 months.

## 2017-12-03 ENCOUNTER — Telehealth: Payer: Self-pay

## 2017-12-03 NOTE — Telephone Encounter (Signed)
PA has been started via [facility] for Daliresp 500. Will await determination.   KEY: [id number]

## 2017-12-07 ENCOUNTER — Ambulatory Visit: Payer: Medicare Other

## 2017-12-07 NOTE — Telephone Encounter (Signed)
PA for Deliresp was denied for not meeting PA requirements. PM, do you want to file an appeal or order a different medication?

## 2017-12-08 NOTE — Telephone Encounter (Signed)
Can we try an appeal. There is no good alternative to daliresp

## 2017-12-08 NOTE — Telephone Encounter (Signed)
Elizabeth [name] can you help compose this letter for appeal?

## 2017-12-08 NOTE — Telephone Encounter (Signed)
PM please see below message regarding appeal letter. Thanks.

## 2017-12-09 NOTE — Telephone Encounter (Signed)
Per PM verbally- write appeal letter stating that pt has moderate COPD and has frequent exacerbations. Letter has been written and appeal will be started on 12/10/17

## 2017-12-10 ENCOUNTER — Ambulatory Visit: Payer: Medicare Other

## 2017-12-13 ENCOUNTER — Telehealth (HOSPITAL_COMMUNITY): Payer: Self-pay | Admitting: Pharmacist

## 2017-12-14 ENCOUNTER — Telehealth: Payer: Self-pay

## 2017-12-14 MED ORDER — PREDNISONE 10 MG PO TABS: ORAL_TABLET | ORAL | 0 refills | Status: DC

## 2017-12-14 MED ORDER — AZITHROMYCIN 250 MG PO TABS: ORAL_TABLET | ORAL | 0 refills | Status: DC

## 2017-12-14 NOTE — Telephone Encounter (Signed)
Called and spoke to patient regarding Daliresp 546m medication Advised that [facility] stated PA denied, Margie [facility] spoke with DJackelyn Polingin ALudlowdept phone at [phone] She advised no appeal letter was rec'd, and no appeal was in action at this time DJackelyn Polingtook appeal info over the phone and expedited the appeal The reason for appeal per PM was pt has moderate COPD with frequent exacerbations on inhaled steroids. Appeal discussion will be made in 72 hours by fax and phone - reference # [id number] Will route this message to [facility] follow up in 3 days, reference # above

## 2017-12-14 NOTE — Telephone Encounter (Signed)
Left VM for patient to return my call regarding medication from [place].  Called and spoke with [facility] regarding medication Daliresp 56m She states insurance is not paying for this medication, the PA was not going through  PA was denied, do not meet PA requirements. Appeal letter was sent on 12/10/2017 Waiting on appeal letter to be processed.

## 2017-12-14 NOTE — Telephone Encounter (Signed)
PA request received from pharmacy. PA initiated through covermymeds. Key for PA is [id number]  Will route to [facility] for follow up

## 2017-12-14 NOTE — Telephone Encounter (Signed)
Pt is calling about Daliresp 500. Per Pt Elizabeth [name] has been trying to call this office about this medication. Cb is [phone].

## 2017-12-14 NOTE — Telephone Encounter (Signed)
Called and spoke with patient regarding Daliresp 525m Advised [facility] Debbie expedited the appeal over the phone and will have discission 72 hrs approved or not Pt verbalized understanding, had no further questions.   When speaking with patient on phone today, pt was SOB, wheezing while talking Pt stated that her breathing has worsen since OV visit with PM 11/30/17 Offered pt OV today she denied, offered tomorrow OV and denied Spoke with PM regarding symptoms of pt Placed order for Zpak and prednisone taper per PM Advised pt with with info, she verbalized understanding

## 2017-12-15 ENCOUNTER — Telehealth: Payer: Self-pay | Admitting: Cardiology

## 2017-12-15 NOTE — Telephone Encounter (Signed)
Jennie calling from [facility] calling for clinical information about the PA for [place]. Case number [id number] BI Cb is [phone] ext [phone]

## 2017-12-15 NOTE — Telephone Encounter (Signed)
Cardiac Rehab Medication Review by a Pharmacist  Does the patient  feel that his/her medications are working for him/her?  yes  Has the patient been experiencing any side effects to the medications prescribed?  Some tingling in the fingers - started last week (no new medications at that time), not particularly bothersome. She is also concerned about weight gain with prednisone.  Does the patient measure his/her own blood pressure or blood glucose at home?  no - "not elevated"  Does the patient have any problems obtaining medications due to transportation or finances?   Yes - Daliresp pending prior auth, hasn't started yet  Understanding of regimen: good Understanding of indications: fair Potential of compliance: good    Pharmacist comments: Mrs. Shellenbarger is a pleasant 62 y/o female with a significant history of COPD, currently being treated for a COPD exacerbation. Today she started a 5 day course of azithromycin and a 12-day prednisone taper. She also is not currently taking metoprolol, an is unsure if she should be. She is going to make an appointment with her cardiologist to address this. Also of note, she was recently prescribed Daliresp but it was not covered by insurance so she hasn't started it yet.    Charlene Brooke, PharmD PGY1 Pharmacy Resident Email: [email or web address] Pager: [phone] 12/15/2017 4:13 PM

## 2017-12-15 NOTE — Telephone Encounter (Signed)
New message    *STAT* If patient is at the pharmacy, call can be transferred to refill team.   1. Which medications need to be refilled? (please list name of each medication and dose if known) Pt says she lost pill bottles but she needs her heart and blood pressure medication  2. Which pharmacy/location (including street and city if local pharmacy) is medication to be sent to?  [facility] [place] [address] [phone] (Phone) [phone] (Fax)   3. Do they need a 30 day or 90 day supply? [place]

## 2017-12-15 NOTE — Telephone Encounter (Signed)
Received denial on Daliresp  Need appeals information  Called [phone] for appeals info  Per MB, letter already done  The number to fax letter to: [phone] The number to call and check status is [phone] (takes 7 days) I have faxed the letter and also the last ov note to the number above and will forward to MB to f/u on

## 2017-12-16 MED ORDER — METOPROLOL SUCCINATE ER 50 MG PO TB24: 50.0000 mg | ORAL_TABLET | Freq: Every day | ORAL | 0 refills | Status: DC

## 2017-12-16 NOTE — Telephone Encounter (Addendum)
Spoke with Mindy and gave the rest of the clinical information for the PA. She will review case and inform us when determination is made. Will route to [facility] for follow up.

## 2017-12-16 NOTE — Telephone Encounter (Signed)
Jennie calling from [facility] for clinical information about the PA for [place]. Cb is [phone] ext [phone].

## 2017-12-16 NOTE — Telephone Encounter (Signed)
LVM for Patrici Ranks with [facility] for clinical info on PA for [place] at [phone] x [phone]

## 2017-12-16 NOTE — Telephone Encounter (Signed)
Spoke with pt, Refill sent to the pharmacy electronically.

## 2017-12-16 NOTE — Telephone Encounter (Signed)
Mindy with [employer] calling back, CB [phone] (Mindy's direct #). She asked that this be marked as urgent.

## 2017-12-17 ENCOUNTER — Telehealth: Payer: Self-pay | Admitting: Pulmonary Disease

## 2017-12-17 NOTE — Telephone Encounter (Signed)
Pt is calling because [employer] had called her stating her prescription has been approved. She would like to know when she can fill this. Pharm [facility] [address] Cb is [phone].

## 2017-12-17 NOTE — Telephone Encounter (Signed)
Received expedited appeal form from [place] [facility]. Form has been signed by PM and faxed back to [place] [facility].

## 2017-12-17 NOTE — Telephone Encounter (Signed)
ATC pt, no answer. Left message for pt to call back.  She can start medication now.

## 2017-12-19 DIAGNOSIS — J449 Chronic obstructive pulmonary disease, unspecified: Secondary | ICD-10-CM | POA: Diagnosis not present

## 2017-12-19 DIAGNOSIS — R911 Solitary pulmonary nodule: Secondary | ICD-10-CM | POA: Diagnosis not present

## 2017-12-20 ENCOUNTER — Telehealth (HOSPITAL_COMMUNITY): Payer: Self-pay | Admitting: *Deleted

## 2017-12-20 NOTE — Telephone Encounter (Signed)
Thank you for the update. OK to stay off metoprolol for the time being. Will re-address at her upcoming appointment. MCr

## 2017-12-20 NOTE — Telephone Encounter (Signed)
-----  Message from Sanda Klein, MD sent at 12/20/2017 11:16 AM EST -----   ----- Message ----- From: Rowe Pavy, RN Sent: 12/20/2017  11:01 AM To: Sanda Klein, MD  Dr. Sallyanne Kuster For patients who are scheduled for cardiac rehab they have a medication reconciliation  Please see the pharmacist note about pt not taking the metoprolol.  It looks there have been telephone calls to resolve the issue with the [place]. Please advise how best to proceed Thanks Cherre Huger, BSN Cardiac and Pulmonary Rehab Nurse Navigator

## 2017-12-20 NOTE — Telephone Encounter (Signed)
lmtcb x2 for pt.

## 2017-12-21 ENCOUNTER — Ambulatory Visit (HOSPITAL_COMMUNITY): Payer: Medicare Other

## 2017-12-21 NOTE — Telephone Encounter (Signed)
lmtcb x3 for pt. 

## 2017-12-21 NOTE — Telephone Encounter (Signed)
Spoke with pt, she wanted to know if she could receive her RX for Daliresp. I advised I would call the pharmacy and see if everything went through. They stated it went through for 3 dollars but would have to order the medication and it would not be ready until after 12pm 12/22/2017. I called pt to let her know and nothing further is needed.

## 2017-12-21 NOTE — Telephone Encounter (Signed)
Patient returning call - she states she hasn't gotten rx that was to be sent - doesn't know the name. [facility] uses [facility] [address]. Patient can be reached at [phone] -pr

## 2017-12-27 ENCOUNTER — Ambulatory Visit (HOSPITAL_COMMUNITY): Payer: Medicare Other

## 2017-12-27 NOTE — Addendum Note (Signed)
Addended by: Hulan Fray on: 12/27/2017 06:55 PM   Modules accepted: Orders

## 2017-12-29 ENCOUNTER — Ambulatory Visit (HOSPITAL_COMMUNITY): Payer: Medicare Other

## 2017-12-30 DIAGNOSIS — J449 Chronic obstructive pulmonary disease, unspecified: Secondary | ICD-10-CM | POA: Diagnosis not present

## 2017-12-30 DIAGNOSIS — M329 Systemic lupus erythematosus, unspecified: Secondary | ICD-10-CM | POA: Diagnosis not present

## 2017-12-31 ENCOUNTER — Ambulatory Visit (HOSPITAL_COMMUNITY): Payer: Medicare Other

## 2017-12-31 ENCOUNTER — Ambulatory Visit (INDEPENDENT_AMBULATORY_CARE_PROVIDER_SITE_OTHER): Payer: Medicare Other | Admitting: Internal Medicine

## 2017-12-31 VITALS — BP 163/80 | HR 86 | Temp 97.7°F | Wt 223.4 lb

## 2017-12-31 DIAGNOSIS — R059 Cough, unspecified: Secondary | ICD-10-CM

## 2017-12-31 DIAGNOSIS — Z79899 Other long term (current) drug therapy: Secondary | ICD-10-CM

## 2017-12-31 DIAGNOSIS — R351 Nocturia: Secondary | ICD-10-CM

## 2017-12-31 DIAGNOSIS — R05 Cough: Secondary | ICD-10-CM

## 2017-12-31 DIAGNOSIS — J9809 Other diseases of bronchus, not elsewhere classified: Secondary | ICD-10-CM

## 2017-12-31 DIAGNOSIS — K219 Gastro-esophageal reflux disease without esophagitis: Secondary | ICD-10-CM | POA: Diagnosis not present

## 2017-12-31 DIAGNOSIS — R350 Frequency of micturition: Secondary | ICD-10-CM | POA: Diagnosis not present

## 2017-12-31 DIAGNOSIS — J449 Chronic obstructive pulmonary disease, unspecified: Secondary | ICD-10-CM

## 2017-12-31 DIAGNOSIS — Z87891 Personal history of nicotine dependence: Secondary | ICD-10-CM

## 2017-12-31 LAB — POCT URINALYSIS DIPSTICK
Bilirubin, UA: NEGATIVE
Glucose, UA: NEGATIVE
KETONES UA: NEGATIVE
LEUKOCYTES UA: NEGATIVE
NITRITE UA: NEGATIVE
PH UA: 6 (ref 5.0–8.0)
Spec Grav, UA: 1.025 (ref 1.010–1.025)
UROBILINOGEN UA: 0.2 U/dL

## 2017-12-31 NOTE — Progress Notes (Signed)
   CC:  urinary frequency   HPI:  [name] is a 62 y.o. with PMH as listed below who presents for acute concern of urinary frequency . Please see the assessment and plans for the status of the patient chronic medical problems.    Past Medical History:  Diagnosis Date  . CAD (coronary artery disease)    NSTEMI 08/2011:  LHC 08/21/11: mLAD 60-70%, pCFX occluded, dRCA chronic occlusion with L-R collats, EF 40-45%, inf AK.  PCI:  BMS to CFX.  Marland Kitchen CHF (congestive heart failure) ([place]) 05/31/2017  . COPD (chronic obstructive pulmonary disease) ([place]) 11/17/2016  . Depression   . Fibroids   . GERD (gastroesophageal reflux disease)   . Gout   . HLD (hyperlipidemia)    Chol = 235, LDL = 156 (08/2010)  . Hypertension   . Lupus 2009   ANA + 10/2008, repeat ANA + (05/2009), on that visit 05/2009 following labs obtained RF <20, CRP <0.4, ANA titers 1:80,   . On home oxygen therapy    "2L; 24/7 (05/31/2017)  . Pulmonary nodules 05/2008   noted on CXR and CT 05/2008, repeat CT 10/2008 - Stable small bilateral pulmonary nodules measuring up to 6 m m  . Stroke [facility]) 04/2014   "mini stroke"; denies residual on 05/31/2017  . Toe pain, right 10/08/2017   Review of Systems:  Refer to history of present illness and assessment and plans for pertinent review of systems, all others reviewed and negative  Physical Exam:  Vitals:   12/31/17 1544  BP: (!) 163/80  Pulse: 86  Temp: 97.7 F (36.5 C)  TempSrc: Oral  Weight: 223 lb 6.4 oz (101.3 kg)   General: well appearing, no acute distress  Cardiac: regular rate and rhythm, no peripheral edema  Pulm: wearing nasal canula, no respiratory distress, speaking in full sentences, Lungs clear to auscultation, no coughing during this interview   Assessment & Plan:   Urinary frequency  Describing nocturia when she takes torsemide late in the evening which resolves when she takes the torsemide in the morning or skips the medication. She denies  dysuria or nausea.  - start torsemide 60 mg q AM   Cough  3 months of cough productive of small amounts of sputum and associated with a painful tight chest wall. She has a history of obstructive lung disease managed with trelegy and albuterol. She has a 45 pack year history but no longer smokes. Chest xray earlier today showed mild hyperexpansion with bronchovascular thickening. She  is not on any ACE- I therapy, she is on once daily protonix and says that her symptoms of GERD are controlled, she is using daily flonase but is not on a daily antihistamine.  Upper airway cough syndrome may be related to uncontrolled allergies and post nasal drip.  - diclofenac for the musculoskeletal chest wall pain  - start claritin daily  - encouraged to attend pulmonary PT  See Encounters Tab for problem based charting.  Patient discussed with Dr. Daryll Drown

## 2017-12-31 NOTE — Patient Instructions (Addendum)
For your cough,   Start taking Claritin every day Try mucinex when you feel chest congestion or symptoms of a cold  For the pain on your chest from the cough, try using the voltaren gel on your chest for the cough   Take the torsemide 60 mg first thing in the morning when you wake up so that it does not make you have to urinate throughout the night  Is keep this follow-up appointment that you have scheduled with Dr. Hetty Ely in June, move this up if you would like to be seen sooner.

## 2018-01-03 ENCOUNTER — Ambulatory Visit (INDEPENDENT_AMBULATORY_CARE_PROVIDER_SITE_OTHER): Payer: Medicare Other | Admitting: Internal Medicine

## 2018-01-03 ENCOUNTER — Ambulatory Visit: Payer: Medicare Other

## 2018-01-03 ENCOUNTER — Ambulatory Visit (HOSPITAL_COMMUNITY): Payer: Medicare Other

## 2018-01-03 DIAGNOSIS — I110 Hypertensive heart disease with heart failure: Secondary | ICD-10-CM | POA: Diagnosis not present

## 2018-01-03 DIAGNOSIS — I480 Paroxysmal atrial fibrillation: Secondary | ICD-10-CM | POA: Diagnosis not present

## 2018-01-03 DIAGNOSIS — E785 Hyperlipidemia, unspecified: Secondary | ICD-10-CM | POA: Diagnosis not present

## 2018-01-03 DIAGNOSIS — Z79899 Other long term (current) drug therapy: Secondary | ICD-10-CM | POA: Diagnosis not present

## 2018-01-03 DIAGNOSIS — M79672 Pain in left foot: Secondary | ICD-10-CM

## 2018-01-03 DIAGNOSIS — Z9981 Dependence on supplemental oxygen: Secondary | ICD-10-CM

## 2018-01-03 DIAGNOSIS — M79671 Pain in right foot: Secondary | ICD-10-CM | POA: Diagnosis not present

## 2018-01-03 DIAGNOSIS — I5032 Chronic diastolic (congestive) heart failure: Secondary | ICD-10-CM | POA: Diagnosis not present

## 2018-01-03 DIAGNOSIS — J449 Chronic obstructive pulmonary disease, unspecified: Secondary | ICD-10-CM

## 2018-01-03 DIAGNOSIS — Z8673 Personal history of transient ischemic attack (TIA), and cerebral infarction without residual deficits: Secondary | ICD-10-CM

## 2018-01-03 DIAGNOSIS — I252 Old myocardial infarction: Secondary | ICD-10-CM

## 2018-01-03 DIAGNOSIS — Z7901 Long term (current) use of anticoagulants: Secondary | ICD-10-CM

## 2018-01-03 NOTE — Assessment & Plan Note (Signed)
Presenting with acute onset of bilateral foot pain 3 days right > left.  Pain is worse with walking and not relieved by Tylenol. Shes not been able to sleep with a sheet on her foot or wear shoes since the pain began.  She was prescribed colchicine 0.6 mg to be taken daily at the time of last office visit, shes been taking 1 tablet daily since that time and it has not helped with her current pain. The diagnosis of gout is not yet crystal proven, unfortunately she does not have a joint effusion which can be drained today.  - colchicine 1.2 mg when she gets home followed by 0.6 mg today then 0.6 mg BID until one - two days after the pain resolved then stop taking daily  - RTC when the pain has resolved for baseline uric acid and to begin urate lowering therapy  - continue with plan to establish with rheumatology

## 2018-01-03 NOTE — Patient Instructions (Addendum)
Elizabeth [name],   Try to move up your appointment scheduled with the rheumatologist, have the records sent to his office after you have seen them  When you get home take 2 of the colchicine tablets then one tablet and hour after you have taken the two. Tomorrow start taking the colchicine 0.6 mg twice a day until the pain resolves. Stop taking the colchicine after the pain resolves, you do not need to take this medication every day.   After this pain has resolved we will need to start you on a medication to prevent further flairs of your pain, you should schedule a visit to see me once the pain has improved if you have not already seen the rheumatologist by that time.   FOLLOW-UP INSTRUCTIONS When: 1 - 2 week  For: gout  What to bring: medication bottles

## 2018-01-03 NOTE — Progress Notes (Signed)
   CC: bilateral foot pain   HPI:  [name] is a 62 y.o. with PMH paroxysmal atrial fibrillation (on eliquis), chronic diastolic heart failure (1/54 EF 40-45%, G2DD) , coronary artery disease (s/p CABGx4), COPD (on 2 L of oxygen at home) ,GERD, hyperlipidemia, hypertension, depression, pulmonary nodules, and CVA  who presents for  acute concern of foot pain. Please see the assessment and plans for the status of the patient chronic medical problems.    Past Medical History:  Diagnosis Date  . CAD (coronary artery disease)    NSTEMI 08/2011:  LHC 08/21/11: mLAD 60-70%, pCFX occluded, dRCA chronic occlusion with L-R collats, EF 40-45%, inf AK.  PCI:  BMS to CFX.  Marland Kitchen CHF (congestive heart failure) ([place]) 05/31/2017  . COPD (chronic obstructive pulmonary disease) ([place]) 11/17/2016  . Depression   . Fibroids   . GERD (gastroesophageal reflux disease)   . Gout   . HLD (hyperlipidemia)    Chol = 235, LDL = 156 (08/2010)  . Hypertension   . Lupus 2009   ANA + 10/2008, repeat ANA + (05/2009), on that visit 05/2009 following labs obtained RF <20, CRP <0.4, ANA titers 1:80,   . On home oxygen therapy    "2L; 24/7 (05/31/2017)  . Pulmonary nodules 05/2008   noted on CXR and CT 05/2008, repeat CT 10/2008 - Stable small bilateral pulmonary nodules measuring up to 6 m m  . Stroke [facility]) 04/2014   "mini stroke"; denies residual on 05/31/2017  . Toe pain, right 10/08/2017   Review of Systems:  Refer to history of present illness and assessment and plans for pertinent review of systems, all others reviewed and negative  Physical Exam:  Vitals:   01/03/18 1024  BP: 116/84  Pulse: 68  Temp: 97.7 F (36.5 C)  TempSrc: Oral  SpO2: 97%   General: well appearing, no acute distress  MSL: swelling and erythema of the right midfoot, mildly warm to touch and tender with palpation of the medial and lateral midfoot, left foot is not swollen or erythematous but tender to palpation on the dorsum,  there is no obvious joint effusion   Assessment & Plan:   Foot pain, ? Gout  Presenting with acute onset of bilateral foot pain 3 days right > left.  Pain is worse with walking and not relieved by Tylenol. She's not been able to sleep with a sheet on her foot or wear shoes since the pain began.  She was prescribed colchicine 0.6 mg to be taken daily at the time of last office visit, shes been taking 1 tablet daily since that time and it has not helped with her current pain. The diagnosis of gout is not yet crystal proven, unfortunately she does not have a joint effusion which can be drained today.  - colchicine 1.2 mg when she gets home followed by 0.6 mg today then 0.6 mg BID until one - two days after the pain resolved then stop taking daily  - RTC when the pain has resolved for baseline uric acid and to begin urate lowering therapy  - continue with plan to establish with rheumatology   See Encounters Tab for problem based charting.  Patient discussed with Dr. Dareen Piano

## 2018-01-04 ENCOUNTER — Ambulatory Visit: Payer: Medicare Other

## 2018-01-04 ENCOUNTER — Encounter: Payer: Self-pay | Admitting: Internal Medicine

## 2018-01-04 DIAGNOSIS — R350 Frequency of micturition: Secondary | ICD-10-CM | POA: Insufficient documentation

## 2018-01-04 NOTE — Assessment & Plan Note (Signed)
Describing nocturia when she takes torsemide late in the evening which resolves when she takes the torsemide in the morning or skips the medication. She denies dysuria or nausea.  - start torsemide 60 mg q AM

## 2018-01-04 NOTE — Progress Notes (Signed)
Internal Medicine Clinic Attending  Case discussed with Dr. Blum at the time of the visit.  We reviewed the resident's history and exam and pertinent patient test results.  I agree with the assessment, diagnosis, and plan of care documented in the resident's note. 

## 2018-01-04 NOTE — Assessment & Plan Note (Signed)
3 months of cough productive of small amounts of sputum and associated with a painful tight chest wall. She has a history of obstructive lung disease managed with trelegy and albuterol. She has a 45 pack year history but no longer smokes. Chest xray earlier today showed mild hyperexpansion with bronchovascular thickening. She  is not on any ACE- I therapy, she is on once daily protonix and says that her symptoms of GERD are controlled, she is using daily flonase but is not on a daily antihistamine.  Upper airway cough syndrome may be related to uncontrolled allergies and post nasal drip.  - diclofenac for the musculoskeletal chest wall pain  - start claritin daily  - encouraged to attend pulmonary PT

## 2018-01-05 ENCOUNTER — Telehealth: Payer: Self-pay | Admitting: Internal Medicine

## 2018-01-05 ENCOUNTER — Ambulatory Visit (HOSPITAL_COMMUNITY): Payer: Medicare Other

## 2018-01-05 NOTE — Telephone Encounter (Signed)
Patient is requesting she can something for pain for her foot, she said please

## 2018-01-05 NOTE — Telephone Encounter (Signed)
Elizabeth [name] should be taking colchicine twice daily until the toe pain resolves. She should try using topical asparcreme if the colchicine is not helping to completely resolve the pain.

## 2018-01-06 ENCOUNTER — Telehealth: Payer: Self-pay | Admitting: *Deleted

## 2018-01-06 NOTE — Telephone Encounter (Signed)
Pt crying, c/o pain in foot, spoke w/ dr Daryll Drown, would call dr Evette Doffing to see if he could possibly inject foot/ toe for gout. Pt refuses, wants prednisone orally sent to pharmacy, states she cannot walk and needs relief, refuses ED. Please advise

## 2018-01-06 NOTE — Telephone Encounter (Signed)
Reviewed chart today.  She does not have crystal proven gout, but a history previously of gout per report.  She is not improving with colchicine, which is worrisome, however, glucocorticoids are reasonable for severe attacks.  She has HFrEF with EF of 40%.  Steroids would put her at risk for a CHF exacerbation.    Plan Prednisone 60m daily X 5 days She will need to weigh herself daily, continue to take torsemide as prescribed.  If she develops worsening swelling or SOB, she should come in to the clinic or ED Please confirm correct pharmacy for me to send it in.   Thanks!

## 2018-01-06 NOTE — Telephone Encounter (Signed)
Please see 12/17/17 phone note.

## 2018-01-07 ENCOUNTER — Ambulatory Visit: Payer: Medicare Other | Admitting: Pulmonary Disease

## 2018-01-07 ENCOUNTER — Ambulatory Visit (HOSPITAL_COMMUNITY): Payer: Medicare Other

## 2018-01-07 MED ORDER — PREDNISONE 20 MG PO TABS: 40.0000 mg | ORAL_TABLET | Freq: Every day | ORAL | 0 refills | Status: AC

## 2018-01-07 NOTE — Telephone Encounter (Signed)
Thank you Dr. Daryll Drown and Bonnita Nasuti

## 2018-01-07 NOTE — Telephone Encounter (Signed)
Spoke w/ pt this am, she was given instructions and she repeated back, she states she will be very cautious.

## 2018-01-07 NOTE — Telephone Encounter (Signed)
Sent in 5 day supply of prednisone to [facility] on [address].    Thanks

## 2018-01-10 ENCOUNTER — Ambulatory Visit (HOSPITAL_COMMUNITY): Payer: Medicare Other

## 2018-01-11 ENCOUNTER — Other Ambulatory Visit: Payer: Self-pay | Admitting: Internal Medicine

## 2018-01-12 ENCOUNTER — Ambulatory Visit (HOSPITAL_COMMUNITY): Payer: Medicare Other

## 2018-01-14 ENCOUNTER — Ambulatory Visit (HOSPITAL_COMMUNITY): Payer: Medicare Other

## 2018-01-16 DIAGNOSIS — R911 Solitary pulmonary nodule: Secondary | ICD-10-CM | POA: Diagnosis not present

## 2018-01-16 DIAGNOSIS — J449 Chronic obstructive pulmonary disease, unspecified: Secondary | ICD-10-CM | POA: Diagnosis not present

## 2018-01-17 ENCOUNTER — Ambulatory Visit (HOSPITAL_COMMUNITY): Payer: Medicare Other

## 2018-01-19 ENCOUNTER — Ambulatory Visit (HOSPITAL_COMMUNITY): Payer: Medicare Other

## 2018-01-21 ENCOUNTER — Ambulatory Visit (HOSPITAL_COMMUNITY): Payer: Medicare Other

## 2018-01-24 ENCOUNTER — Ambulatory Visit (HOSPITAL_COMMUNITY): Payer: Medicare Other

## 2018-01-26 ENCOUNTER — Ambulatory Visit (HOSPITAL_COMMUNITY): Payer: Medicare Other

## 2018-01-27 DIAGNOSIS — M329 Systemic lupus erythematosus, unspecified: Secondary | ICD-10-CM | POA: Diagnosis not present

## 2018-01-27 DIAGNOSIS — J449 Chronic obstructive pulmonary disease, unspecified: Secondary | ICD-10-CM | POA: Diagnosis not present

## 2018-01-28 ENCOUNTER — Ambulatory Visit (HOSPITAL_COMMUNITY): Payer: Medicare Other

## 2018-01-31 ENCOUNTER — Ambulatory Visit (HOSPITAL_COMMUNITY): Payer: Medicare Other

## 2018-02-01 ENCOUNTER — Telehealth (HOSPITAL_COMMUNITY): Payer: Self-pay | Admitting: Pharmacist

## 2018-02-02 ENCOUNTER — Ambulatory Visit (HOSPITAL_COMMUNITY): Payer: Medicare Other

## 2018-02-03 ENCOUNTER — Other Ambulatory Visit: Payer: Self-pay | Admitting: Internal Medicine

## 2018-02-04 ENCOUNTER — Ambulatory Visit (HOSPITAL_COMMUNITY): Payer: Medicare Other

## 2018-02-04 NOTE — Telephone Encounter (Signed)
Cardiac Rehab Medication Review by a Pharmacist  Does the patient  feel that his/her medications are working for him/her?  No--patient with significant COPD, patient states that she feels her Trelegy and other inhalers are not helping her at this time.   Has the patient been experiencing any side effects to the medications prescribed?  no  Does the patient measure his/her own blood pressure or blood glucose at home?  no   Does the patient have any problems obtaining medications due to transportation or finances?   no  Understanding of regimen: good Understanding of indications: fair Potential of compliance: good   Pharmacist comments: [name] is a 62 y.o. female  with noticeable increased work of breathing while on the phone this evening. Patient states this "has been a very bad day for her breathing." She is noticeably out of breath while speaking to me and often has to take a moment to catch her breath during our conversation. Patient reports that she is "scared" about her respiratory status and is so worried she becomes tearful on the phone. Patient denies recent use of her short acting inhalers or nebulizers recently today, I encouraged patient to take a moment to use one of these medications to help with her breathing. Patient stated "they wouldn't help." Patient endorses contemplating calling an ambulance today because her breathing is so poor but she does not want to stay in the hospital. I encouraged patient to call 911 if breathing condition continued to worsen. Patient states that her sister and 2 nieces are currently in the house with her and monitoring her condition, so I felt comfortable not intervening with calling 911 on her behalf. I was unable to fully assess all of the patient's medications (about half assessed) as patient was very worked up over her breathing and could not cooperate with further prompting. Unsure if patient current functional status conducive to patient  attending cardiac rehab at this time.   Jalene Mullet, Pharm.D. PGY1 Pharmacy Resident 02/04/2018 4:21 PM Main Pharmacy: [phone]

## 2018-02-07 ENCOUNTER — Ambulatory Visit (HOSPITAL_COMMUNITY): Payer: Medicare Other

## 2018-02-07 ENCOUNTER — Other Ambulatory Visit: Payer: Self-pay | Admitting: *Deleted

## 2018-02-08 ENCOUNTER — Telehealth: Payer: Self-pay | Admitting: *Deleted

## 2018-02-08 ENCOUNTER — Emergency Department (HOSPITAL_COMMUNITY): Payer: Medicare Other

## 2018-02-08 ENCOUNTER — Encounter (HOSPITAL_COMMUNITY): Payer: Self-pay | Admitting: Emergency Medicine

## 2018-02-08 ENCOUNTER — Other Ambulatory Visit: Payer: Self-pay

## 2018-02-08 ENCOUNTER — Emergency Department (HOSPITAL_COMMUNITY)
Admission: EM | Admit: 2018-02-08 | Discharge: 2018-02-08 | Disposition: A | Discharge status: Home / Self Care | Payer: Medicare Other | Source: Home / Self Care | Attending: Emergency Medicine | Admitting: Emergency Medicine

## 2018-02-08 ENCOUNTER — Encounter (HOSPITAL_COMMUNITY)
Admission: RE | Admit: 2018-02-08 | Discharge: 2018-02-08 | Disposition: A | Discharge status: Home / Self Care | Payer: Medicare Other | Source: Ambulatory Visit | Attending: Cardiovascular Disease | Admitting: Cardiovascular Disease

## 2018-02-08 DIAGNOSIS — I5043 Acute on chronic combined systolic (congestive) and diastolic (congestive) heart failure: Secondary | ICD-10-CM | POA: Diagnosis not present

## 2018-02-08 DIAGNOSIS — E876 Hypokalemia: Secondary | ICD-10-CM | POA: Diagnosis not present

## 2018-02-08 DIAGNOSIS — R0602 Shortness of breath: Secondary | ICD-10-CM | POA: Insufficient documentation

## 2018-02-08 DIAGNOSIS — Z7902 Long term (current) use of antithrombotics/antiplatelets: Secondary | ICD-10-CM | POA: Diagnosis not present

## 2018-02-08 DIAGNOSIS — I2722 Pulmonary hypertension due to left heart disease: Secondary | ICD-10-CM | POA: Diagnosis not present

## 2018-02-08 DIAGNOSIS — R05 Cough: Secondary | ICD-10-CM | POA: Diagnosis not present

## 2018-02-08 DIAGNOSIS — I110 Hypertensive heart disease with heart failure: Secondary | ICD-10-CM | POA: Diagnosis not present

## 2018-02-08 DIAGNOSIS — M109 Gout, unspecified: Secondary | ICD-10-CM | POA: Diagnosis not present

## 2018-02-08 DIAGNOSIS — I5082 Biventricular heart failure: Secondary | ICD-10-CM | POA: Diagnosis not present

## 2018-02-08 DIAGNOSIS — Z66 Do not resuscitate: Secondary | ICD-10-CM | POA: Diagnosis not present

## 2018-02-08 DIAGNOSIS — I2510 Atherosclerotic heart disease of native coronary artery without angina pectoris: Secondary | ICD-10-CM | POA: Diagnosis not present

## 2018-02-08 DIAGNOSIS — I480 Paroxysmal atrial fibrillation: Secondary | ICD-10-CM | POA: Diagnosis not present

## 2018-02-08 DIAGNOSIS — Z5321 Procedure and treatment not carried out due to patient leaving prior to being seen by health care provider: Secondary | ICD-10-CM | POA: Insufficient documentation

## 2018-02-08 DIAGNOSIS — J432 Centrilobular emphysema: Secondary | ICD-10-CM | POA: Diagnosis not present

## 2018-02-08 DIAGNOSIS — Z9119 Patient's noncompliance with other medical treatment and regimen: Secondary | ICD-10-CM | POA: Diagnosis not present

## 2018-02-08 DIAGNOSIS — R109 Unspecified abdominal pain: Secondary | ICD-10-CM | POA: Diagnosis not present

## 2018-02-08 DIAGNOSIS — I2721 Secondary pulmonary arterial hypertension: Secondary | ICD-10-CM | POA: Diagnosis not present

## 2018-02-08 DIAGNOSIS — J449 Chronic obstructive pulmonary disease, unspecified: Secondary | ICD-10-CM | POA: Diagnosis not present

## 2018-02-08 DIAGNOSIS — Z955 Presence of coronary angioplasty implant and graft: Secondary | ICD-10-CM

## 2018-02-08 DIAGNOSIS — K219 Gastro-esophageal reflux disease without esophagitis: Secondary | ICD-10-CM | POA: Diagnosis not present

## 2018-02-08 DIAGNOSIS — I509 Heart failure, unspecified: Secondary | ICD-10-CM | POA: Diagnosis not present

## 2018-02-08 DIAGNOSIS — Z91128 Patient's intentional underdosing of medication regimen for other reason: Secondary | ICD-10-CM | POA: Diagnosis not present

## 2018-02-08 DIAGNOSIS — E785 Hyperlipidemia, unspecified: Secondary | ICD-10-CM | POA: Diagnosis not present

## 2018-02-08 DIAGNOSIS — I2723 Pulmonary hypertension due to lung diseases and hypoxia: Secondary | ICD-10-CM | POA: Diagnosis not present

## 2018-02-08 DIAGNOSIS — J9621 Acute and chronic respiratory failure with hypoxia: Secondary | ICD-10-CM | POA: Diagnosis not present

## 2018-02-08 DIAGNOSIS — N179 Acute kidney failure, unspecified: Secondary | ICD-10-CM | POA: Diagnosis not present

## 2018-02-08 DIAGNOSIS — T501X6A Underdosing of loop [high-ceiling] diuretics, initial encounter: Secondary | ICD-10-CM | POA: Diagnosis not present

## 2018-02-08 DIAGNOSIS — Z9981 Dependence on supplemental oxygen: Secondary | ICD-10-CM | POA: Diagnosis not present

## 2018-02-08 DIAGNOSIS — G4733 Obstructive sleep apnea (adult) (pediatric): Secondary | ICD-10-CM | POA: Diagnosis not present

## 2018-02-08 LAB — I-STAT TROPONIN, ED: Troponin i, poc: 0.01 ng/mL (ref 0.00–0.08)

## 2018-02-08 LAB — CBC
HEMATOCRIT: 42.3 % (ref 36.0–46.0)
HEMOGLOBIN: 13 g/dL (ref 12.0–15.0)
MCH: 26.6 pg (ref 26.0–34.0)
MCHC: 30.7 g/dL (ref 30.0–36.0)
MCV: 86.7 fL (ref 78.0–100.0)
Platelets: 224 10*3/uL (ref 150–400)
RBC: 4.88 MIL/uL (ref 3.87–5.11)
RDW: 18.1 % — ABNORMAL HIGH (ref 11.5–15.5)
WBC: 5.8 10*3/uL (ref 4.0–10.5)

## 2018-02-08 LAB — BASIC METABOLIC PANEL
ANION GAP: 12 (ref 5–15)
BUN: 9 mg/dL (ref 6–20)
CHLORIDE: 105 mmol/L (ref 101–111)
CO2: 22 mmol/L (ref 22–32)
CREATININE: 0.8 mg/dL (ref 0.44–1.00)
Calcium: 9.5 mg/dL (ref 8.9–10.3)
GFR calc non Af Amer: >60 mL/min (ref >60)
Glucose, Bld: 89 mg/dL (ref 65–99)
POTASSIUM: 3.5 mmol/L (ref 3.5–5.1)
SODIUM: 139 mmol/L (ref 135–145)

## 2018-02-08 LAB — I-STAT BETA HCG BLOOD, ED (MC, WL, AP ONLY)

## 2018-02-08 LAB — BRAIN NATRIURETIC PEPTIDE: B NATRIURETIC PEPTIDE 5: 1043.9 pg/mL — AB (ref 0.0–100.0)

## 2018-02-08 MED ORDER — TRAZODONE HCL 50 MG PO TABS: 50.0000 mg | ORAL_TABLET | Freq: Every day | ORAL | 3 refills | Status: AC

## 2018-02-08 NOTE — Telephone Encounter (Signed)
Second request for refill on colchicine. Hubbard Hartshorn, RN, BSN

## 2018-02-08 NOTE — Telephone Encounter (Signed)
Error.

## 2018-02-08 NOTE — Progress Notes (Signed)
OUTPATIENT CARDIAC REHAB  PMH:  08/2017 DES , CHF, PUL HTH, COPD  Primary Cardiologist:  Dr. Recardo Evangelist, [facility] Medicine [facility]  Pt arrived at cardiac rehab orientation c/o dypspnea on exertion, O2 sat-90% on home O2 3L.  Pt switched to hospital O2 at 4L [place]., O2 sat -94-97%.  Attempt to perform 6 min walk test.  Pt desaturated to 84% on 4L, O2 increased to 6L with resolution. Pt c/o moderate DOE.  Lungs:  Tachypnea, course rales and wheezes bilaterally.  Peripheral edema noted.  Pt does not weigh herself at home. Pt reports she has not taken home meds today, and has not eaten today.  In addition, pt notes she has not take torsemide in 3 days because label says "take as needed".  Pt is not using nebulizer or inhalers.  PC to Internal Medicine clinic for work in appointment. Recommendation per IM clinic is to take pt to ED for evaluation.  Pt taken to ED via [place] on O2 4L [place] in stable condition. Pt was verbally unhappy with going to ED. Pt offered emotional support and reassurance. Pt niece given explanation for ED transfer.  She is grateful for the care received and would like to see her aunts breathing improve as she notes pt unable to ambulate in home without significant dyspnea.  Report given to triage nurse. Andi Hence, RN, BSN Cardiac Pulmonary Rehab 02/08/18 3:33 PM

## 2018-02-08 NOTE — Telephone Encounter (Signed)
Second request for refill on trazodone. Hubbard Hartshorn, RN, BSN

## 2018-02-08 NOTE — ED Provider Notes (Signed)
Patient placed in Quick Look pathway, seen and evaluated   Chief Complaint: SOB  HPI:   Patient from cardiac rehab for worsening SOB.  States she can't walk more than a few steps without extreme SOB.  ON 2L home O2 24/7.  No CP.  Mild cough.  No fever.    Cardiologist: Dr. Recardo Evangelist   ROS: SOB  Physical Exam:   Gen: No distress  Neuro: Awake and Alert  Skin: Warm    Focused Exam: PE: Gen: A&O x4 HEENT: PERRL, EOM intact CHEST: RRR, no m/r/g LUNGS: CTAB, no w/r/r ABD: BS x 4, ND/NT EXT: No edema, strong peripheral pulses NEURO: Sensation and strength intact bilaterally  Interested in home health.  Initiation of care has begun. The patient has been counseled on the process, plan, and necessity for staying for the completion/evaluation, and the remainder of the medical screening examination    Montine Circle, Hershal Coria 02/08/18 1555    Sherwood Gambler, MD 02/08/18 [phone]

## 2018-02-08 NOTE — Telephone Encounter (Signed)
I have refilled the colchicine to [facility] on [address]. Please have Ms. Fancher schedule an appointment in the acute care clinic for her next availability for evaluation of the gout and to start urate lowering therapy.

## 2018-02-08 NOTE — ED Triage Notes (Signed)
Pt brought to ED from cardiac rehab ref increased shortness of breath.  Pt is on home 02 at 2LPM at all times.  Pt st's she is unable to walk a short distance without getting extremely short of breath

## 2018-02-09 ENCOUNTER — Ambulatory Visit (HOSPITAL_COMMUNITY): Payer: Medicare Other

## 2018-02-09 ENCOUNTER — Telehealth (HOSPITAL_COMMUNITY): Payer: Self-pay | Admitting: Cardiac Rehabilitation

## 2018-02-09 NOTE — Telephone Encounter (Signed)
pc to pt to assess breathing symptoms today. Pt states dyspnea improving however still present when ambulating in house. Pt states she left ED without treatment due to extended wait time. However pt reports she was contacted by RN who advised her to take torsemide daily. Pt states she has taken it today.  Pt instructed to keep pulmonary appt 02/11/18 as scheduled and HOLD Cardiac rehab until further instruction. Pt verbalized understanding.  Andi Hence, RN, BSN Cardiac Pulmonary Rehab 02/09/18 4:38 PM

## 2018-02-09 NOTE — Telephone Encounter (Signed)
Patient is scheduled to see you on April 30.  Is that okay or does she still need to be seen in [facility].  Please advise.

## 2018-02-11 ENCOUNTER — Encounter: Payer: Self-pay | Admitting: Pulmonary Disease

## 2018-02-11 ENCOUNTER — Emergency Department (HOSPITAL_COMMUNITY)
Admit: 2018-02-11 | Discharge: 2018-02-11 | Disposition: A | Discharge status: Home / Self Care | Payer: Medicare Other | Attending: Emergency Medicine | Admitting: Emergency Medicine

## 2018-02-11 ENCOUNTER — Encounter (HOSPITAL_COMMUNITY): Payer: Self-pay

## 2018-02-11 ENCOUNTER — Ambulatory Visit (INDEPENDENT_AMBULATORY_CARE_PROVIDER_SITE_OTHER): Payer: Medicare Other | Admitting: Pulmonary Disease

## 2018-02-11 ENCOUNTER — Emergency Department (HOSPITAL_COMMUNITY): Payer: Medicare Other

## 2018-02-11 ENCOUNTER — Other Ambulatory Visit: Payer: Self-pay

## 2018-02-11 ENCOUNTER — Ambulatory Visit (HOSPITAL_COMMUNITY): Payer: Medicare Other

## 2018-02-11 ENCOUNTER — Inpatient Hospital Stay (HOSPITAL_COMMUNITY)
Admission: EM | Admit: 2018-02-11 | Discharge: 2018-02-16 | DRG: 286 | Disposition: A | Discharge status: Home Health | Payer: Medicare Other | Attending: Student in an Organized Health Care Education/Training Program | Admitting: Student in an Organized Health Care Education/Training Program

## 2018-02-11 VITALS — BP 130/74 | HR 80 | Ht 62.0 in | Wt 214.0 lb

## 2018-02-11 DIAGNOSIS — I480 Paroxysmal atrial fibrillation: Secondary | ICD-10-CM | POA: Diagnosis present

## 2018-02-11 DIAGNOSIS — I252 Old myocardial infarction: Secondary | ICD-10-CM

## 2018-02-11 DIAGNOSIS — I2722 Pulmonary hypertension due to left heart disease: Secondary | ICD-10-CM | POA: Diagnosis present

## 2018-02-11 DIAGNOSIS — I5043 Acute on chronic combined systolic (congestive) and diastolic (congestive) heart failure: Secondary | ICD-10-CM | POA: Diagnosis not present

## 2018-02-11 DIAGNOSIS — R079 Chest pain, unspecified: Secondary | ICD-10-CM | POA: Diagnosis not present

## 2018-02-11 DIAGNOSIS — I2723 Pulmonary hypertension due to lung diseases and hypoxia: Secondary | ICD-10-CM | POA: Diagnosis present

## 2018-02-11 DIAGNOSIS — M542 Cervicalgia: Secondary | ICD-10-CM | POA: Diagnosis not present

## 2018-02-11 DIAGNOSIS — I272 Pulmonary hypertension, unspecified: Secondary | ICD-10-CM | POA: Diagnosis not present

## 2018-02-11 DIAGNOSIS — M109 Gout, unspecified: Secondary | ICD-10-CM | POA: Diagnosis present

## 2018-02-11 DIAGNOSIS — J449 Chronic obstructive pulmonary disease, unspecified: Secondary | ICD-10-CM | POA: Diagnosis not present

## 2018-02-11 DIAGNOSIS — G4733 Obstructive sleep apnea (adult) (pediatric): Secondary | ICD-10-CM | POA: Diagnosis present

## 2018-02-11 DIAGNOSIS — I2510 Atherosclerotic heart disease of native coronary artery without angina pectoris: Secondary | ICD-10-CM | POA: Diagnosis not present

## 2018-02-11 DIAGNOSIS — J432 Centrilobular emphysema: Secondary | ICD-10-CM | POA: Diagnosis not present

## 2018-02-11 DIAGNOSIS — Z8673 Personal history of transient ischemic attack (TIA), and cerebral infarction without residual deficits: Secondary | ICD-10-CM

## 2018-02-11 DIAGNOSIS — R0602 Shortness of breath: Secondary | ICD-10-CM

## 2018-02-11 DIAGNOSIS — R10A1 Flank pain, right side: Secondary | ICD-10-CM

## 2018-02-11 DIAGNOSIS — I361 Nonrheumatic tricuspid (valve) insufficiency: Secondary | ICD-10-CM | POA: Diagnosis not present

## 2018-02-11 DIAGNOSIS — Z79899 Other long term (current) drug therapy: Secondary | ICD-10-CM | POA: Diagnosis not present

## 2018-02-11 DIAGNOSIS — Z6839 Body mass index (BMI) 39.0-39.9, adult: Secondary | ICD-10-CM

## 2018-02-11 DIAGNOSIS — R0600 Dyspnea, unspecified: Secondary | ICD-10-CM | POA: Diagnosis present

## 2018-02-11 DIAGNOSIS — J9621 Acute and chronic respiratory failure with hypoxia: Secondary | ICD-10-CM | POA: Diagnosis not present

## 2018-02-11 DIAGNOSIS — E876 Hypokalemia: Secondary | ICD-10-CM | POA: Diagnosis present

## 2018-02-11 DIAGNOSIS — R109 Unspecified abdominal pain: Secondary | ICD-10-CM | POA: Diagnosis present

## 2018-02-11 DIAGNOSIS — I509 Heart failure, unspecified: Secondary | ICD-10-CM

## 2018-02-11 DIAGNOSIS — N179 Acute kidney failure, unspecified: Secondary | ICD-10-CM | POA: Diagnosis not present

## 2018-02-11 DIAGNOSIS — Z951 Presence of aortocoronary bypass graft: Secondary | ICD-10-CM | POA: Diagnosis not present

## 2018-02-11 DIAGNOSIS — Z91128 Patient's intentional underdosing of medication regimen for other reason: Secondary | ICD-10-CM

## 2018-02-11 DIAGNOSIS — E669 Obesity, unspecified: Secondary | ICD-10-CM | POA: Diagnosis present

## 2018-02-11 DIAGNOSIS — Z9981 Dependence on supplemental oxygen: Secondary | ICD-10-CM | POA: Diagnosis not present

## 2018-02-11 DIAGNOSIS — I110 Hypertensive heart disease with heart failure: Secondary | ICD-10-CM | POA: Diagnosis not present

## 2018-02-11 DIAGNOSIS — R05 Cough: Secondary | ICD-10-CM

## 2018-02-11 DIAGNOSIS — R768 Other specified abnormal immunological findings in serum: Secondary | ICD-10-CM

## 2018-02-11 DIAGNOSIS — I5042 Chronic combined systolic (congestive) and diastolic (congestive) heart failure: Secondary | ICD-10-CM | POA: Diagnosis not present

## 2018-02-11 DIAGNOSIS — M7989 Other specified soft tissue disorders: Secondary | ICD-10-CM | POA: Diagnosis not present

## 2018-02-11 DIAGNOSIS — E785 Hyperlipidemia, unspecified: Secondary | ICD-10-CM | POA: Diagnosis present

## 2018-02-11 DIAGNOSIS — R911 Solitary pulmonary nodule: Secondary | ICD-10-CM | POA: Diagnosis not present

## 2018-02-11 DIAGNOSIS — Z7901 Long term (current) use of anticoagulants: Secondary | ICD-10-CM

## 2018-02-11 DIAGNOSIS — E041 Nontoxic single thyroid nodule: Secondary | ICD-10-CM | POA: Diagnosis not present

## 2018-02-11 DIAGNOSIS — I2729 Other secondary pulmonary hypertension: Secondary | ICD-10-CM | POA: Diagnosis not present

## 2018-02-11 DIAGNOSIS — Z87891 Personal history of nicotine dependence: Secondary | ICD-10-CM

## 2018-02-11 DIAGNOSIS — T501X6A Underdosing of loop [high-ceiling] diuretics, initial encounter: Secondary | ICD-10-CM | POA: Diagnosis present

## 2018-02-11 DIAGNOSIS — K219 Gastro-esophageal reflux disease without esophagitis: Secondary | ICD-10-CM | POA: Diagnosis present

## 2018-02-11 DIAGNOSIS — Z7902 Long term (current) use of antithrombotics/antiplatelets: Secondary | ICD-10-CM | POA: Diagnosis not present

## 2018-02-11 DIAGNOSIS — R760 Raised antibody titer: Secondary | ICD-10-CM | POA: Diagnosis not present

## 2018-02-11 DIAGNOSIS — I5082 Biventricular heart failure: Secondary | ICD-10-CM | POA: Diagnosis present

## 2018-02-11 DIAGNOSIS — Z66 Do not resuscitate: Secondary | ICD-10-CM | POA: Diagnosis present

## 2018-02-11 DIAGNOSIS — M25561 Pain in right knee: Secondary | ICD-10-CM | POA: Diagnosis not present

## 2018-02-11 DIAGNOSIS — Z7951 Long term (current) use of inhaled steroids: Secondary | ICD-10-CM

## 2018-02-11 DIAGNOSIS — R058 Other specified cough: Secondary | ICD-10-CM

## 2018-02-11 DIAGNOSIS — Z955 Presence of coronary angioplasty implant and graft: Secondary | ICD-10-CM

## 2018-02-11 DIAGNOSIS — Z8249 Family history of ischemic heart disease and other diseases of the circulatory system: Secondary | ICD-10-CM

## 2018-02-11 DIAGNOSIS — I2721 Secondary pulmonary arterial hypertension: Secondary | ICD-10-CM | POA: Diagnosis present

## 2018-02-11 DIAGNOSIS — Z87898 Personal history of other specified conditions: Secondary | ICD-10-CM

## 2018-02-11 DIAGNOSIS — Z8739 Personal history of other diseases of the musculoskeletal system and connective tissue: Secondary | ICD-10-CM | POA: Diagnosis not present

## 2018-02-11 DIAGNOSIS — R7689 Other specified abnormal immunological findings in serum: Secondary | ICD-10-CM

## 2018-02-11 DIAGNOSIS — L930 Discoid lupus erythematosus: Secondary | ICD-10-CM | POA: Diagnosis present

## 2018-02-11 DIAGNOSIS — Z9119 Patient's noncompliance with other medical treatment and regimen: Secondary | ICD-10-CM | POA: Diagnosis not present

## 2018-02-11 DIAGNOSIS — J9611 Chronic respiratory failure with hypoxia: Secondary | ICD-10-CM | POA: Diagnosis not present

## 2018-02-11 LAB — CBC WITH DIFFERENTIAL/PLATELET
BASOS PCT: 0 %
Basophils Absolute: 0 10*3/uL (ref 0.0–0.1)
Eosinophils Absolute: 0 10*3/uL (ref 0.0–0.7)
Eosinophils Relative: 0 %
HEMATOCRIT: 43.5 % (ref 36.0–46.0)
HEMOGLOBIN: 13.8 g/dL (ref 12.0–15.0)
LYMPHS ABS: 1.9 10*3/uL (ref 0.7–4.0)
Lymphocytes Relative: 28 %
MCH: 27.5 pg (ref 26.0–34.0)
MCHC: 31.7 g/dL (ref 30.0–36.0)
MCV: 86.8 fL (ref 78.0–100.0)
MONOS PCT: 11 %
Monocytes Absolute: 0.7 10*3/uL (ref 0.1–1.0)
NEUTROS ABS: 4 10*3/uL (ref 1.7–7.7)
NEUTROS PCT: 61 %
Platelets: 235 10*3/uL (ref 150–400)
RBC: 5.01 MIL/uL (ref 3.87–5.11)
RDW: 18 % — ABNORMAL HIGH (ref 11.5–15.5)
WBC: 6.6 10*3/uL (ref 4.0–10.5)

## 2018-02-11 LAB — COMPREHENSIVE METABOLIC PANEL
ALBUMIN: 3.9 g/dL (ref 3.5–5.0)
ALK PHOS: 141 U/L — AB (ref 38–126)
ALT: 20 U/L (ref 14–54)
ANION GAP: 13 (ref 5–15)
AST: 28 U/L (ref 15–41)
BILIRUBIN TOTAL: 1.1 mg/dL (ref 0.3–1.2)
BUN: 12 mg/dL (ref 6–20)
CALCIUM: 9.2 mg/dL (ref 8.9–10.3)
CO2: 24 mmol/L (ref 22–32)
Chloride: 99 mmol/L — ABNORMAL LOW (ref 101–111)
Creatinine, Ser: 1.08 mg/dL — ABNORMAL HIGH (ref 0.44–1.00)
GFR calc Af Amer: >60 mL/min (ref >60)
GFR calc non Af Amer: 55 mL/min — ABNORMAL LOW (ref >60)
GLUCOSE: 98 mg/dL (ref 65–99)
Potassium: 3.1 mmol/L — ABNORMAL LOW (ref 3.5–5.1)
Sodium: 136 mmol/L (ref 135–145)
TOTAL PROTEIN: 8.1 g/dL (ref 6.5–8.1)

## 2018-02-11 LAB — I-STAT TROPONIN, ED: Troponin i, poc: 0 ng/mL (ref 0.00–0.08)

## 2018-02-11 LAB — MAGNESIUM: MAGNESIUM: 1.6 mg/dL — AB (ref 1.7–2.4)

## 2018-02-11 LAB — BRAIN NATRIURETIC PEPTIDE: B NATRIURETIC PEPTIDE 5: 575.1 pg/mL — AB (ref 0.0–100.0)

## 2018-02-11 MED ORDER — POTASSIUM CHLORIDE CRYS ER 20 MEQ PO TBCR
40.0000 meq | EXTENDED_RELEASE_TABLET | Freq: Two times a day (BID) | ORAL | Status: AC
  Administered 2018-02-11 – 2018-02-12 (×2): 40 meq via ORAL
  Filled 2018-02-11 (×2): qty 2

## 2018-02-11 MED ORDER — UMECLIDINIUM BROMIDE 62.5 MCG/INH IN AEPB: 1.0000 | INHALATION_SPRAY | Freq: Every day | RESPIRATORY_TRACT | Status: DC

## 2018-02-11 MED ORDER — APIXABAN 5 MG PO TABS
5.0000 mg | ORAL_TABLET | Freq: Two times a day (BID) | ORAL | Status: DC
  Administered 2018-02-11 – 2018-02-14 (×6): 5 mg via ORAL
  Filled 2018-02-11 (×6): qty 1

## 2018-02-11 MED ORDER — ACETAMINOPHEN 325 MG PO TABS
650.0000 mg | ORAL_TABLET | Freq: Once | ORAL | Status: AC
  Administered 2018-02-11: 650 mg via ORAL
  Filled 2018-02-11: qty 2

## 2018-02-11 MED ORDER — POTASSIUM CHLORIDE CRYS ER 20 MEQ PO TBCR
40.0000 meq | EXTENDED_RELEASE_TABLET | Freq: Once | ORAL | Status: AC
  Administered 2018-02-11: 40 meq via ORAL
  Filled 2018-02-11: qty 2

## 2018-02-11 MED ORDER — UMECLIDINIUM BROMIDE 62.5 MCG/INH IN AEPB
1.0000 | INHALATION_SPRAY | Freq: Every day | RESPIRATORY_TRACT | Status: DC
  Administered 2018-02-12 – 2018-02-13 (×2): 1 via RESPIRATORY_TRACT
  Filled 2018-02-11: qty 7

## 2018-02-11 MED ORDER — ACETAMINOPHEN 500 MG PO TABS
500.0000 mg | ORAL_TABLET | Freq: Four times a day (QID) | ORAL | Status: DC | PRN
  Administered 2018-02-11: 500 mg via ORAL
  Filled 2018-02-11: qty 1

## 2018-02-11 MED ORDER — CLOPIDOGREL BISULFATE 75 MG PO TABS
75.0000 mg | ORAL_TABLET | Freq: Every day | ORAL | Status: DC
  Administered 2018-02-11 – 2018-02-16 (×6): 75 mg via ORAL
  Filled 2018-02-11 (×6): qty 1

## 2018-02-11 MED ORDER — LORATADINE 10 MG PO TABS: 10.0000 mg | ORAL_TABLET | Freq: Every day | ORAL | Status: DC

## 2018-02-11 MED ORDER — FLUTICASONE FUROATE-VILANTEROL 200-25 MCG/INH IN AEPB: 1.0000 | INHALATION_SPRAY | Freq: Every day | RESPIRATORY_TRACT | Status: DC

## 2018-02-11 MED ORDER — NITROGLYCERIN 0.4 MG SL SUBL: 0.4000 mg | SUBLINGUAL_TABLET | SUBLINGUAL | Status: DC | PRN

## 2018-02-11 MED ORDER — ROFLUMILAST 500 MCG PO TABS
500.0000 ug | ORAL_TABLET | Freq: Every day | ORAL | Status: DC
  Administered 2018-02-12 – 2018-02-15 (×4): 500 ug via ORAL
  Filled 2018-02-11 (×5): qty 1

## 2018-02-11 MED ORDER — DICLOFENAC SODIUM 1 % TD GEL
4.0000 g | Freq: Four times a day (QID) | TRANSDERMAL | Status: DC
  Filled 2018-02-11: qty 100

## 2018-02-11 MED ORDER — FUROSEMIDE 10 MG/ML IJ SOLN
80.0000 mg | Freq: Once | INTRAMUSCULAR | Status: AC
  Administered 2018-02-11: 80 mg via INTRAVENOUS
  Filled 2018-02-11: qty 8

## 2018-02-11 MED ORDER — PANTOPRAZOLE SODIUM 40 MG PO TBEC
40.0000 mg | DELAYED_RELEASE_TABLET | Freq: Every day | ORAL | Status: DC
  Administered 2018-02-12 – 2018-02-16 (×5): 40 mg via ORAL
  Filled 2018-02-11 (×5): qty 1

## 2018-02-11 MED ORDER — FLUTICASONE PROPIONATE 50 MCG/ACT NA SUSP
2.0000 | Freq: Every day | NASAL | Status: DC
  Administered 2018-02-12 – 2018-02-14 (×3): 2 via NASAL
  Filled 2018-02-11 (×2): qty 16

## 2018-02-11 MED ORDER — SENNOSIDES-DOCUSATE SODIUM 8.6-50 MG PO TABS
1.0000 | ORAL_TABLET | Freq: Every evening | ORAL | Status: DC | PRN
Start: 2018-02-11 — End: 2018-02-16

## 2018-02-11 MED ORDER — MAGNESIUM SULFATE 2 GM/50ML IV SOLN
2.0000 g | Freq: Once | INTRAVENOUS | Status: AC
  Administered 2018-02-11: 2 g via INTRAVENOUS
  Filled 2018-02-11: qty 50

## 2018-02-11 MED ORDER — FUROSEMIDE 10 MG/ML IJ SOLN
40.0000 mg | Freq: Two times a day (BID) | INTRAMUSCULAR | Status: DC
  Administered 2018-02-11: 40 mg via INTRAVENOUS
  Filled 2018-02-11: qty 4

## 2018-02-11 MED ORDER — ROSUVASTATIN CALCIUM 20 MG PO TABS
20.0000 mg | ORAL_TABLET | Freq: Every day | ORAL | Status: DC
  Administered 2018-02-11 – 2018-02-15 (×5): 20 mg via ORAL
  Filled 2018-02-11 (×6): qty 1

## 2018-02-11 MED ORDER — TRAMADOL HCL 50 MG PO TABS
50.0000 mg | ORAL_TABLET | Freq: Four times a day (QID) | ORAL | Status: DC | PRN
  Administered 2018-02-12: 50 mg via ORAL
  Filled 2018-02-11: qty 1

## 2018-02-11 MED ORDER — TRAZODONE HCL 50 MG PO TABS
50.0000 mg | ORAL_TABLET | Freq: Every day | ORAL | Status: DC
  Administered 2018-02-11 – 2018-02-15 (×5): 50 mg via ORAL
  Filled 2018-02-11 (×5): qty 1

## 2018-02-11 MED ORDER — IPRATROPIUM-ALBUTEROL 0.5-2.5 (3) MG/3ML IN SOLN
3.0000 mL | Freq: Four times a day (QID) | RESPIRATORY_TRACT | Status: DC | PRN
  Administered 2018-02-13: 3 mL via RESPIRATORY_TRACT
  Filled 2018-02-11: qty 3

## 2018-02-11 MED ORDER — METOPROLOL SUCCINATE ER 50 MG PO TB24
50.0000 mg | ORAL_TABLET | Freq: Every day | ORAL | Status: DC
  Administered 2018-02-12 – 2018-02-16 (×5): 50 mg via ORAL
  Filled 2018-02-11 (×5): qty 1

## 2018-02-11 MED ORDER — FLUTICASONE FUROATE-VILANTEROL 200-25 MCG/INH IN AEPB
1.0000 | INHALATION_SPRAY | Freq: Every day | RESPIRATORY_TRACT | Status: DC
  Administered 2018-02-12 – 2018-02-13 (×2): 1 via RESPIRATORY_TRACT
  Filled 2018-02-11: qty 28

## 2018-02-11 NOTE — ED Provider Notes (Signed)
[facility] EMERGENCY DEPARTMENT Provider Note   CSN: [id number] Arrival date & time: 02/11/18  1122     History   Chief Complaint Chief Complaint  Patient presents with  . Shortness of Breath    HPI Elizabeth [name] is a 62 y.o. female.  Patient with past medical history of lupus, heart failure (HFrEF=40-45%) on torsemide 76m in AM/211min PM, coronary artery disease s/p CABG in July 2017,  paroxysmal A. Fib on Eliquis, COPD, hypertension -- presents with complaint of progressively worsening shortness of breath, much worse with exertion.  She reports approximately 23 pound weight gain over the past several weeks.  Patient was Dr. MaMatilde Bashffice today and was referred to the emergency department for further evaluation.  Patient had initial evaluation on 3/26 which showed elevated BNP, EKG with changes. Reported hypoxia and had a 70s with exertion.  Patient denies any chest pain.  She has mild lower extremity swelling and noted that today her left leg is more swollen than her right leg.  No history of blood clots.  She is compliant with her medications.  No fevers or cough.  No significant wheezing recently. The onset of this condition was gradual. The course is worsening.  Reports decreased urinary output yesterday despite taking her torsemide as directed.     Past Medical History:  Diagnosis Date  . CAD (coronary artery disease)    NSTEMI 08/2011:  LHC 08/21/11: mLAD 60-70%, pCFX occluded, dRCA chronic occlusion with L-R collats, EF 40-45%, inf AK.  PCI:  BMS to CFX.  . Marland KitchenHF (congestive heart failure) (HCNoorvik07/16/2018  . COPD (chronic obstructive pulmonary disease) (HCWhite Island Shores1/12/2016  . Depression   . Fibroids   . GERD (gastroesophageal reflux disease)   . Gout   . HLD (hyperlipidemia)    Chol = 235, LDL = 156 (08/2010)  . Hypertension   . Lupus 2009   ANA + 10/2008, repeat ANA + (05/2009), on that visit 05/2009 following labs obtained RF <20, CRP <0.4, ANA  titers 1:80,   . On home oxygen therapy    "2L; 24/7 (05/31/2017)  . Pulmonary nodules 05/2008   noted on CXR and CT 05/2008, repeat CT 10/2008 - Stable small bilateral pulmonary nodules measuring up to 6 m m  . Stroke ([facility]   "mini stroke"; denies residual on 05/31/2017  . Toe pain, right 10/08/2017    Patient Active Problem List   Diagnosis Date Noted  . Urinary frequency 01/04/2018  . Influenza in adult 11/13/2017  . Pulmonary hypertension (HCWiota11/10/2017  . Status post coronary artery stent placement   . Hx of CABG   . Acute on chronic combined systolic and diastolic CHF (congestive heart failure) (HCWoodcreek11/11/2016  . Chronic respiratory failure with hypoxia (HCSeligman08/11/2016  . Obstructive sleep apnea 06/09/2017  . Severe obesity (BMI >= 40) (HCPinole07/25/2018  . Acute respiratory failure with hypoxia (HCKamas01/07/2017  . COPD, group D, by GOLD 2017 classification (HCNewburgh01/12/2016  . Elevated serum alkaline phosphatase level 09/24/2016  . Long term current use of anticoagulant 09/22/2016  . Coronary artery disease involving native coronary artery of native heart with angina pectoris (HCFreeman Spur  . Esophageal reflux   . PAF (paroxysmal atrial fibrillation) (HCSmithton06/03/2016  . Chronic diastolic CHF (congestive heart failure) (HCMiamisburg06/03/2016  . Vitamin D deficiency 03/06/2016  . Healthcare maintenance 08/19/2015  . Atherosclerosis of aorta (HCWagon Mound07/29/2016  . Insomnia 12/25/2014  . Prediabetes 12/25/2013  . Cough 11/18/2011  .  Ischemic cardiomyopathy 10/29/2011  . PULMONARY NODULE 06/14/2008  . Anxiety and depression 12/27/2006  . Dyslipidemia 12/24/2006  . Essential hypertension 12/24/2006  . CAD S/P percutaneous coronary angioplasty 12/24/2006  . LUPUS 12/24/2006    Past Surgical History:  Procedure Laterality Date  . CARDIAC CATHETERIZATION N/A 06/02/2016   Procedure: Left Heart Cath and Coronary Angiography;  Surgeon: Belva Crome, MD;  Location: [facility];   Service: Cardiovascular;  Laterality: N/A;  . COLONOSCOPY N/A 07/14/2013   Procedure: COLONOSCOPY;  Surgeon: Beryle Beams, MD;  Location: [facility];  Service: Endoscopy;  Laterality: N/A;  . CORONARY ANGIOPLASTY WITH STENT PLACEMENT    . CORONARY ARTERY BYPASS GRAFT N/A 06/08/2016   Procedure: CORONARY ARTERY BYPASS GRAFTING (CABG) X 4 Diagnostic Diagram (LIMA-LAD, SVG-OM-CFX, SVG-RCA) With Right Greater Saphenous vein endoscopic harvesting;  Surgeon: [name], MD;  Location: [facility];  Service: Open Heart Surgery;  Laterality: N/A;  . CORONARY STENT INTERVENTION N/A 09/22/2017   Procedure: CORONARY STENT INTERVENTION;  Surgeon: Martinique, Peter M, MD;  Location: [facility];  Service: Cardiovascular;  Laterality: N/A;  . FRACTURE SURGERY    . ORIF TIBIA PLATEAU Left 08/22/2013   Procedure: OPEN REDUCTION INTERNAL FIXATION (ORIF) TIBIAL PLATEAU;  Surgeon: Renette Butters, MD;  Location: [facility];  Service: Orthopedics;  Laterality: Left;  . RIGHT/LEFT HEART CATH AND CORONARY/GRAFT ANGIOGRAPHY N/A 09/20/2017   Procedure: RIGHT/LEFT HEART CATH AND CORONARY/GRAFT ANGIOGRAPHY;  Surgeon: Leonie Man, MD;  Location: [facility];  Service: Cardiovascular;  Laterality: N/A;  . TEE WITHOUT CARDIOVERSION N/A 06/08/2016   Procedure: TRANSESOPHAGEAL ECHOCARDIOGRAM (TEE);  Surgeon: [name], MD;  Location: [facility];  Service: Open Heart Surgery;  Laterality: N/A;  . TUBAL LIGATION    . VAGINAL HYSTERECTOMY  05/2003     OB History   None      Home Medications    Prior to Admission medications   Medication Sig Start Date End Date Taking? Authorizing Provider  acetaminophen (TYLENOL) 500 MG tablet Take 500 mg by mouth every 6 (six) hours as needed for mild pain.    [provider]  albuterol (PROVENTIL HFA;VENTOLIN HFA) 108 (90 Base) MCG/ACT inhaler Inhale 2 puffs into the lungs every 6 (six) hours as needed for wheezing or shortness of breath. 12/08/16   Holley Raring, MD  albuterol (PROVENTIL) (2.5 MG/3ML) 0.083% nebulizer solution Take 3 mLs (2.5 mg total) by nebulization every 6 (six) hours as needed for wheezing or shortness of breath. 06/25/17   Chesley Mires, MD  apixaban (ELIQUIS) 5 MG TABS tablet Take 1 tablet (5 mg total) by mouth 2 (two) times daily. 05/18/17   Ledell Noss, MD  clopidogrel (PLAVIX) 75 MG tablet Take 1 tablet (75 mg total) daily by mouth. 09/24/17   Barrett, Evelene Croon, PA-C  colchicine 0.6 MG tablet Take 1 tablet (0.6 mg total) by mouth as needed (once daily when you are having a flair of the gout). 02/08/18   Ledell Noss, MD  fluticasone [facility]) 50 MCG/ACT nasal spray SHAKE LIQUID AND USE 2 SPRAYS IN [facility] NOSTRIL DAILY 11/17/17   Ledell Noss, MD  Fluticasone-Umeclidin-Vilant (TRELEGY ELLIPTA) 100-62.5-25 MCG/INH AEPB Inhale 1 puff into the lungs daily. 11/05/17   Mannam, Hart Robinsons, MD  ipratropium-albuterol (DUONEB) 0.5-2.5 (3) MG/3ML SOLN Take 3 mLs by nebulization every 6 (six) hours as needed. 11/30/17   Mannam, Hart Robinsons, MD  loratadine (CLARITIN) 10 MG tablet Take 10 mg by mouth daily.  [provider]  metoprolol succinate (TOPROL-XL) 50 MG 24 hr tablet Take 1 tablet (50 mg total) by mouth daily. 12/16/17   Erlene Quan, PA-C  nitroGLYCERIN (NITROSTAT) 0.4 MG SL tablet Place 1 tablet (0.4 mg total) under the tongue every 5 (five) minutes x 3 doses as needed for chest pain. 09/18/17   Daune Perch, NP  pantoprazole (PROTONIX) 40 MG tablet TAKE 1 TABLET BY MOUTH DAILY 01/12/18   Ledell Noss, MD  potassium chloride SA (K-DUR,KLOR-CON) 20 MEQ tablet Take 1 tablet (20 mEq total) by mouth daily. 09/18/17   Daune Perch, NP  roflumilast (DALIRESP) 500 MCG TABS tablet Take 1 tablet (500 mcg total) by mouth daily. 11/30/17   Mannam, Hart Robinsons, MD  rosuvastatin (CRESTOR) 20 MG tablet Take 1 tablet (20 mg total) by mouth at bedtime. 07/02/17 07/02/18  Shela Leff, MD  torsemide (DEMADEX) 20 MG tablet Take 3 tablets (60 mg total) by  mouth daily. OK to take an extra tablet daily as needed for weight gain or edema. 11/15/17   Katherine Roan, MD  traZODone (DESYREL) 50 MG tablet Take 1 tablet (50 mg total) by mouth at bedtime. 02/08/18   Ledell Noss, MD    Family History Family History  Problem Relation Age of Onset  . Heart disease Mother   . Cervical cancer Sister   . Prostate cancer Brother   . Colon cancer Neg Hx     Social History Social History   Tobacco Use  . Smoking status: Former Smoker    Packs/day: 0.12    Years: 45.00    Pack years: 5.40    Types: Cigarettes    Last attempt to quit: 11/16/2016    Years since quitting: 1.2  . Smokeless tobacco: Never Used  Substance Use Topics  . Alcohol use: Yes    Alcohol/week: 8.4 oz    Types: 7 Cans of beer, 7 Standard drinks or equivalent per week    Comment: 1 beer/day  . Drug use: No    Types: Cocaine    Comment: 05/31/2017 "nothing since ~ 2016"     Allergies   Patient has no known allergies.   Review of Systems Review of Systems  Constitutional: Negative for diaphoresis and fever.  Eyes: Negative for redness.  Respiratory: Positive for shortness of breath. Negative for cough.   Cardiovascular: Positive for leg swelling. Negative for chest pain and palpitations.  Gastrointestinal: Negative for abdominal pain, nausea and vomiting.  Genitourinary: Negative for dysuria.  Musculoskeletal: Negative for back pain and neck pain.  Skin: Negative for rash.  Neurological: Negative for syncope and light-headedness.  Psychiatric/Behavioral: The patient is not nervous/anxious.      Physical Exam Updated Vital Signs BP 108/65 (BP Location: Left Arm)   Pulse 77   Temp (!) 97.3 F (36.3 C) (Oral)   Resp (!) 21   LMP 12/30/1994   SpO2 92%   Physical Exam  Constitutional: She appears well-developed and well-nourished.  HENT:  Head: Normocephalic and atraumatic.  Mouth/Throat: Mucous membranes are normal. Mucous membranes are not dry.  Eyes:  Conjunctivae are normal.  Neck: Trachea normal and normal range of motion. Neck supple. Normal carotid pulses and no JVD present. No muscular tenderness present. Carotid bruit is not present. No tracheal deviation present.  Cardiovascular: Normal rate, regular rhythm, S1 normal, S2 normal, normal heart sounds and intact distal pulses. Exam reveals no decreased pulses.  No murmur heard. Pulmonary/Chest: Effort normal. No respiratory distress. She has no decreased breath  sounds. She has no wheezes. She has rhonchi. She has no rales. She exhibits no tenderness.  Abdominal: Soft. Normal aorta and bowel sounds are normal. There is no tenderness. There is no rebound and no guarding.  No abdominal distention.  Musculoskeletal: Normal range of motion.       Right lower leg: She exhibits edema.       Left lower leg: She exhibits edema.  1-2+ edema bilaterally, left appears mildly more swollen than the right side.  Neurological: She is alert.  Skin: Skin is warm and dry. She is not diaphoretic. No cyanosis. No pallor.  Psychiatric: She has a normal mood and affect.  Nursing note and vitals reviewed.    ED Treatments / Results  Labs (all labs ordered are listed, but only abnormal results are displayed) Labs Reviewed  CBC WITH DIFFERENTIAL/PLATELET - Abnormal; Notable for the following components:      Result Value   RDW 18.0 (*)    All other components within normal limits  COMPREHENSIVE METABOLIC PANEL - Abnormal; Notable for the following components:   Potassium 3.1 (*)    Chloride 99 (*)    Creatinine, Ser 1.08 (*)    Alkaline Phosphatase 141 (*)    GFR calc non Af Amer 55 (*)    All other components within normal limits  BRAIN NATRIURETIC PEPTIDE - Abnormal; Notable for the following components:   B Natriuretic Peptide 575.1 (*)    All other components within normal limits  I-STAT TROPONIN, ED   ED ECG REPORT   Date: 02/11/2018  Rate: 78  Rhythm: normal sinus rhythm  QRS Axis:  normal  Intervals: QT prolonged  ST/T Wave abnormalities: nonspecific ST/T changes  Conduction Disutrbances:none  Narrative Interpretation:   Old EKG Reviewed: unchanged  I have personally reviewed the EKG tracing and agree with the computerized printout as noted.   Radiology Dg Chest 2 View  Result Date: 02/11/2018 CLINICAL DATA:  Short of breath EXAM: CHEST - 2 VIEW COMPARISON:  02/08/2018 FINDINGS: Cardiac enlargement with left ventricular enlargement. CABG. Negative for heart failure. Lungs are clear without infiltrate or effusion IMPRESSION: No active cardiopulmonary disease. Electronically Signed   By: Franchot Gallo M.D.   On: 02/11/2018 12:24    Procedures Procedures (including critical care time)  Medications Ordered in ED Medications  furosemide (LASIX) injection 80 mg (80 mg Intravenous Given 02/11/18 1423)  potassium chloride SA (K-DUR,KLOR-CON) CR tablet 40 mEq (40 mEq Oral Given 02/11/18 1516)     Initial Impression / Assessment and Plan / ED Course  I have reviewed the triage vital signs and the nursing notes.  Pertinent labs & imaging results that were available during my care of the patient were reviewed by me and considered in my medical decision making (see chart for details).     Patient seen and examined. Work-up initiated. Medications ordered.   Vital signs reviewed and are as follows: BP 108/65 (BP Location: Left Arm)   Pulse 77   Temp (!) 97.3 F (36.3 C) (Oral)   Resp (!) 21   LMP 12/30/1994   SpO2 92%   Will check LE doppler due to L>R leg swelling however patient is anticoagulated and is low-risk for DVT/PE at this time.   3:33 PM Spoke with IMTS who will see.   [place] continues to pend.   Final Clinical Impressions(s) / ED Diagnoses   Final diagnoses:  Shortness of breath  Acute congestive heart failure, unspecified heart failure type ([place])  Admit for SOB/hypoxia, suspicion of CHF exacerbation.   ED Discharge Orders    None         Carlisle Cater, Hershal Coria 02/11/18 1533    Isla Pence, MD 02/11/18 1547

## 2018-02-11 NOTE — H&P (Signed)
Date: 02/11/2018               Patient Name:  Elizabeth [name] MRN: [id number]  DOB: 1956/02/20 Age / Sex: 62 y.o., female   PCP: Ledell Noss, MD         Medical Service: Internal Medicine Teaching Service         Attending Physician: Dr. Lucious Groves, DO    First Contact: Dr. Aggie Hacker Pager: [phone]  Second Contact: Dr. Danford Bad Pager: [phone]       After Hours (After 5p/  First Contact Pager: [phone]  weekends / holidays): Second Contact Pager: [phone]   Chief Complaint: dyspnea on exertion  History of Present Illness:  62 year old with past medical history of lupus, heart failure, coronary artery disease s/p CABG in July 2017 paroxysmal A. Fib on chronic anticoagulation, COPD on continuous supplemental oxygen, and hypertension presenting with a chief complaint of dyspnea on exertion. She was admitted in December 2018 for a acute hypoxic respiratory failure 2/2 to COPD exacerbation. Since that time she has felt that she has had worsening shortness of breath on exertion and has not returned to her baseline. She denies recent illness/infection, fevers, chills, change in diet/fluid intake, or medication non-adherence. She denies orthopnea or signficant lower extremity or abdominal swelling, and has not noticed weight gain. She was recently Sent to the ED from cardiac rehab on 3/26 for increasing dyspnea, hypoxia.However she left without being seen due to the long wait. She states she takes torsemide (60 mg daily) at home and has been taking additional torsemide doses without improvement in her dyspnea. Per pulmonologist note she has not been taking torsemide regularly before her ED visit on 3/26. She denies wheezing, increase in sputum production, or improvement of her symptoms with the use of albuterol. She is compliant with her trelegy inhaler. She denies chest pain. Did endorse left leg swelling greater than right, but denied calf pain or redness.   ED Course:  Vitals: Blood  pressure 108/65 pulse 82, temperature (!) 97.3 F (36.3 C),  resp. rate 21,  SpO2 92 % on 2L [place] Labs: I-stat troponin negative, K 3.1, mag 1.6, mildly elevated Cr 1.08, CBC WNL, BNP 575 Meds: 80 mg IV lasix, Kdur  Meds:  Current Meds  Medication Sig  . acetaminophen (TYLENOL) 500 MG tablet Take 500 mg by mouth every 6 (six) hours as needed for mild pain.  Marland Kitchen albuterol (PROVENTIL HFA;VENTOLIN HFA) 108 (90 Base) MCG/ACT inhaler Inhale 2 puffs into the lungs every 6 (six) hours as needed for wheezing or shortness of breath.  Marland Kitchen apixaban (ELIQUIS) 5 MG TABS tablet Take 1 tablet (5 mg total) by mouth 2 (two) times daily.  . clopidogrel (PLAVIX) 75 MG tablet Take 1 tablet (75 mg total) daily by mouth.  . colchicine 0.6 MG tablet Take 1 tablet (0.6 mg total) by mouth as needed (once daily when you are having a flair of the gout). (Patient taking differently: Take 0.6 mg by mouth daily as needed (once daily when you are having a flair of the gout). )  . fluticasone (FLONASE) 50 MCG/ACT nasal spray SHAKE LIQUID AND USE 2 SPRAYS IN EACH NOSTRIL DAILY (Patient taking differently: SHAKE LIQUID AND USE 2 SPRAYS IN EACH NOSTRIL DAILY AS NEEDED FOR CONGESTION)  . Fluticasone-Umeclidin-Vilant (TRELEGY ELLIPTA) 100-62.5-25 MCG/INH AEPB Inhale 1 puff into the lungs daily.  Marland Kitchen ipratropium-albuterol (DUONEB) 0.5-2.5 (3) MG/3ML SOLN Take 3 mLs by nebulization every 6 (six) hours as needed. (  Patient taking differently: Take 3 mLs by nebulization every 6 (six) hours as needed (shortness of breath/wheezing). )  . metoprolol succinate (TOPROL-XL) 50 MG 24 hr tablet Take 1 tablet (50 mg total) by mouth daily.  . nitroGLYCERIN (NITROSTAT) 0.4 MG SL tablet Place 1 tablet (0.4 mg total) under the tongue every 5 (five) minutes x 3 doses as needed for chest pain.  . OXYGEN Inhale 2-3 L into the lungs continuous.  . pantoprazole (PROTONIX) 40 MG tablet TAKE 1 TABLET BY MOUTH DAILY (Patient taking differently: TAKE 1 TABLET (40 MG)  BY MOUTH DAILY)  . potassium chloride SA (K-DUR,KLOR-CON) 20 MEQ tablet Take 1 tablet (20 mEq total) by mouth daily.  . roflumilast (DALIRESP) 500 MCG TABS tablet Take 1 tablet (500 mcg total) by mouth daily.  Marland Kitchen torsemide (DEMADEX) 20 MG tablet Take 3 tablets (60 mg total) by mouth daily. OK to take an extra tablet daily as needed for weight gain or edema. (Patient taking differently: Take 20-60 mg by mouth See admin instructions. Take 3 tablets (60 mg) by mouth every morning and 1 tablet (20 mg) at night)  . traZODone (DESYREL) 50 MG tablet Take 1 tablet (50 mg total) by mouth at bedtime.     Allergies: Allergies as of 02/11/2018  . (No Known Allergies)   Past Medical History:  Diagnosis Date  . CAD (coronary artery disease)    NSTEMI 08/2011:  LHC 08/21/11: mLAD 60-70%, pCFX occluded, dRCA chronic occlusion with L-R collats, EF 40-45%, inf AK.  PCI:  BMS to CFX.  Marland Kitchen CHF (congestive heart failure) ([place]) 05/31/2017  . COPD (chronic obstructive pulmonary disease) ([place]) 11/17/2016  . Depression   . Fibroids   . GERD (gastroesophageal reflux disease)   . Gout   . HLD (hyperlipidemia)    Chol = 235, LDL = 156 (08/2010)  . Hypertension   . Lupus 2009   ANA + 10/2008, repeat ANA + (05/2009), on that visit 05/2009 following labs obtained RF <20, CRP <0.4, ANA titers 1:80,   . On home oxygen therapy    "2L; 24/7 (05/31/2017)  . Pulmonary nodules 05/2008   noted on CXR and CT 05/2008, repeat CT 10/2008 - Stable small bilateral pulmonary nodules measuring up to 6 m m  . Stroke [facility]) 04/2014   "mini stroke"; denies residual on 05/31/2017  . Toe pain, right 10/08/2017    Family History:  Family History  Problem Relation Age of Onset  . Heart disease Mother   . Cervical cancer Sister   . Prostate cancer Brother   . Colon cancer Neg Hx     Social History:  Social History   Tobacco Use  . Smoking status: Former Smoker    Packs/day: 0.12    Years: 45.00    Pack years: 5.40    Types:  Cigarettes    Last attempt to quit: 11/16/2016    Years since quitting: 1.2  . Smokeless tobacco: Never Used  Substance Use Topics  . Alcohol use: Yes    Alcohol/week: 8.4 oz    Types: 7 Cans of beer, 7 Standard drinks or equivalent per week    Comment: 1 beer/day  . Drug use: No    Types: Cocaine    Comment: 05/31/2017 "nothing since ~ 2016"    Review of Systems: A complete ROS was negative except as per HPI.   Physical Exam: Blood pressure (!) 101/59, pulse 82, temperature (!) 97.3 F (36.3 C), temperature source Oral, resp. rate 15, last  menstrual period 12/30/1994, SpO2 91 %. Physical Exam  Constitutional: She is oriented to person, place, and time. She appears well-developed and well-nourished. No distress.  HENT:  Head: Normocephalic and atraumatic.  Mouth/Throat: Oropharynx is clear and moist.  Eyes: Conjunctivae are normal. No scleral icterus.  Neck: Normal range of motion. Neck supple.  Cardiovascular: Normal rate and regular rhythm. Exam reveals gallop (S3).  Pulmonary/Chest: Effort normal. She has no wheezes. She has rales (faint fine crackles at lung bases bilaterally ).  Abdominal: Soft. Bowel sounds are normal. She exhibits no distension. There is no tenderness.  Musculoskeletal: Normal range of motion. She exhibits no edema.  Neurological: She is alert and oriented to person, place, and time. No cranial nerve deficit.  Skin: Skin is warm and dry.  Psychiatric: She has a normal mood and affect.    EKG: personally reviewed my interpretation is sinus rhythm, TWI II, V2-V6  CXR: personally reviewed my interpretation is negative for focal opacity or pleural effusion, no significant pulmonary edema   Assessment & Plan by Problem: Active Problems:   COPD, group D, by GOLD 2017 classification ([place])   Acute on chronic respiratory failure with hypoxia (HCC)   Acute on chronic combined systolic and diastolic CHF (congestive heart failure) (HCC)   Pulmonary hypertension  (H[facility])   Dyspnea  Dyspnea on Exertion  Patient has an extensive cardiac history including CAD s/p CABG, chronic combined systolic and diastolic heart failure presenting with subacute worsening dyspnea on exertion. Initial labs relatively unremarkable and CXR without significant pulmonary edema. Patient does not appear overtly hypervolemic and has faint bibasilar crackles, and no wheezing on respiratory examination. I do not think her dyspnea is an acute COPD exacerbation. Presentation likely acute heart failure exacerbation given non-adherence to diuretics. Last ECHO in 09/2017 with  estimated ejection fraction was in the range of 40% to 45% and G1DD. Will see how patient responds to 80 mg of IV lasix and determine appropriate diuresis regimen.  -Cardiac monitoring -Echocardiogram -Strict I/Os -Daily weights -Supplemental O2 -PT/OT -Metoprolol 50 mg daily   CAD s/p CABG and DES R/LH cath in 09/2017 revealed severe native CAD. Patient denies active chest pain.  -Continue medical management with Crestor 20 mg daily and Plavix 75 mg daily -May benefit from cardiology consult given severe CAD hx   COPD Pulmonary Hypertension Patient on 2 liter of oxygen continuously, no wheezing on examination. Copd likely a component of her dyspnea at baseline, but I do not think it is the cause of her subacute and progressing dyspnea on exertion and hypoxia.  -Duoneb PRN for SOB or wheezing  Paroxysmal A. Fib on chronic anticoagulation -Cardiac monitoring -Currently in NSR -Continue Eliquis  Hypokalemia and Hypomagnesia K 3.1, Mg 1.6 -Replacing as needed  -Repeat labs in AM   DVT ppx: eliquis Diet: HH  Code status: DNR, confirmed on admission   Dispo: Admit patient to Inpatient with expected length of stay greater than 2 midnights.  Signed: Melanee Spry, MD 02/11/2018, 5:54 PM  Pager: [phone]

## 2018-02-11 NOTE — Progress Notes (Signed)
Pt educated about safety and importance of bed alarm during the night however pt refuses to be on bed alarm. Will continue to round on patient.   Morningstar Toft, RN

## 2018-02-11 NOTE — Progress Notes (Signed)
Elizabeth [name]    [id number]    November 08, 1956  Primary Care Physician:Blum, Gae Bon, MD  Referring Physician: Ledell Noss, MD [place], [place] [address]  Chief complaint:  Follow up for evaluation of COPD GOLD D (CAT score 31, FEV1 55%)  HPI: 62 year old with past medical history of lupus, heart failure, coronary artery disease s/p CABG in July 2017 paroxysmal A. fib, COPD, hypertension. She complaints of ongoing dyspnea on exertion since the CABG surgery. She was sent home at that time on home oxygen which she weaned herself off. However she has worsening of her hypoxia and was restarted on 2 L 24/7 home oxygen in January She was admitted in early January 2018 for acute on chronic respiratory failure secondary to flu a infection and heart failure. Hospitalized in December for COPD exacerbation and setting of Metapneumovirus infection.  Interim History: Sent to the ED from cardiac rehab on 3/26 for increasing dyspnea, hypoxia.  However she left without being seen Labs in ED shows elevated BNP, EKG with ST changes concerning for ischemia. She reports that she has not been taking torsemide regularly before ED visit and has 20 pound weight gain She has been taking the torsemide more regularly for the past 3 days. Still markedly dyspneic with desats whenever she moves  Outpatient Encounter Medications as of 02/11/2018  Medication Sig  . acetaminophen (TYLENOL) 500 MG tablet Take 500 mg by mouth every 6 (six) hours as needed for mild pain.  Marland Kitchen albuterol (PROVENTIL HFA;VENTOLIN HFA) 108 (90 Base) MCG/ACT inhaler Inhale 2 puffs into the lungs every 6 (six) hours as needed for wheezing or shortness of breath.  Marland Kitchen albuterol (PROVENTIL) (2.5 MG/3ML) 0.083% nebulizer solution Take 3 mLs (2.5 mg total) by nebulization every 6 (six) hours as needed for wheezing or shortness of breath.  Marland Kitchen apixaban (ELIQUIS) 5 MG TABS tablet Take 1 tablet (5 mg total) by mouth 2 (two) times daily.    . clopidogrel (PLAVIX) 75 MG tablet Take 1 tablet (75 mg total) daily by mouth.  . colchicine 0.6 MG tablet Take 1 tablet (0.6 mg total) by mouth as needed (once daily when you are having a flair of the gout).  . fluticasone (FLONASE) 50 MCG/ACT nasal spray SHAKE LIQUID AND USE 2 SPRAYS IN EACH NOSTRIL DAILY  . Fluticasone-Umeclidin-Vilant (TRELEGY ELLIPTA) 100-62.5-25 MCG/INH AEPB Inhale 1 puff into the lungs daily.  Marland Kitchen ipratropium-albuterol (DUONEB) 0.5-2.5 (3) MG/3ML SOLN Take 3 mLs by nebulization every 6 (six) hours as needed.  . loratadine (CLARITIN) 10 MG tablet Take 10 mg by mouth daily.  . metoprolol succinate (TOPROL-XL) 50 MG 24 hr tablet Take 1 tablet (50 mg total) by mouth daily.  . nitroGLYCERIN (NITROSTAT) 0.4 MG SL tablet Place 1 tablet (0.4 mg total) under the tongue every 5 (five) minutes x 3 doses as needed for chest pain.  . pantoprazole (PROTONIX) 40 MG tablet TAKE 1 TABLET BY MOUTH DAILY  . potassium chloride SA (K-DUR,KLOR-CON) 20 MEQ tablet Take 1 tablet (20 mEq total) by mouth daily.  . roflumilast (DALIRESP) 500 MCG TABS tablet Take 1 tablet (500 mcg total) by mouth daily.  . rosuvastatin (CRESTOR) 20 MG tablet Take 1 tablet (20 mg total) by mouth at bedtime.  . torsemide (DEMADEX) 20 MG tablet Take 3 tablets (60 mg total) by mouth daily. OK to take an extra tablet daily as needed for weight gain or edema.  . traZODone (DESYREL) 50 MG tablet Take  1 tablet (50 mg total) by mouth at bedtime.  . [DISCONTINUED] azithromycin (ZITHROMAX) 250 MG tablet Take as directed   No facility-administered encounter medications on file as of 02/11/2018.     Allergies as of 02/11/2018  . (No Known Allergies)    Past Medical History:  Diagnosis Date  . CAD (coronary artery disease)    NSTEMI 08/2011:  LHC 08/21/11: mLAD 60-70%, pCFX occluded, dRCA chronic occlusion with L-R collats, EF 40-45%, inf AK.  PCI:  BMS to CFX.  Marland Kitchen CHF (congestive heart failure) ([facility]) 05/31/2017  . COPD  (chronic obstructive pulmonary disease) ([facility]) 11/17/2016  . Depression   . Fibroids   . GERD (gastroesophageal reflux disease)   . Gout   . HLD (hyperlipidemia)    Chol = 235, LDL = 156 (08/2010)  . Hypertension   . Lupus 2009   ANA + 10/2008, repeat ANA + (05/2009), on that visit 05/2009 following labs obtained RF <20, CRP <0.4, ANA titers 1:80,   . On home oxygen therapy    "2L; 24/7 (05/31/2017)  . Pulmonary nodules 05/2008   noted on CXR and CT 05/2008, repeat CT 10/2008 - Stable small bilateral pulmonary nodules measuring up to 6 m m  . Stroke [facility]) 04/2014   "mini stroke"; denies residual on 05/31/2017  . Toe pain, right 10/08/2017    Past Surgical History:  Procedure Laterality Date  . CARDIAC CATHETERIZATION N/A 06/02/2016   Procedure: Left Heart Cath and Coronary Angiography;  Surgeon: Belva Crome, MD;  Location: [facility];  Service: Cardiovascular;  Laterality: N/A;  . COLONOSCOPY N/A 07/14/2013   Procedure: COLONOSCOPY;  Surgeon: Beryle Beams, MD;  Location: [facility];  Service: Endoscopy;  Laterality: N/A;  . CORONARY ANGIOPLASTY WITH STENT PLACEMENT    . CORONARY ARTERY BYPASS GRAFT N/A 06/08/2016   Procedure: CORONARY ARTERY BYPASS GRAFTING (CABG) X 4 Diagnostic Diagram (LIMA-LAD, SVG-OM-CFX, SVG-RCA) With Right Greater Saphenous vein endoscopic harvesting;  Surgeon: [name], MD;  Location: [employer];  Service: Open Heart Surgery;  Laterality: N/A;  . CORONARY STENT INTERVENTION N/A 09/22/2017   Procedure: CORONARY STENT INTERVENTION;  Surgeon: Martinique, Peter M, MD;  Location: [facility];  Service: Cardiovascular;  Laterality: N/A;  . FRACTURE SURGERY    . ORIF TIBIA PLATEAU Left 08/22/2013   Procedure: OPEN REDUCTION INTERNAL FIXATION (ORIF) TIBIAL PLATEAU;  Surgeon: Renette Butters, MD;  Location: [facility];  Service: Orthopedics;  Laterality: Left;  . RIGHT/LEFT HEART CATH AND CORONARY/GRAFT ANGIOGRAPHY N/A 09/20/2017   Procedure: RIGHT/LEFT HEART  CATH AND CORONARY/GRAFT ANGIOGRAPHY;  Surgeon: Leonie Man, MD;  Location: [facility];  Service: Cardiovascular;  Laterality: N/A;  . TEE WITHOUT CARDIOVERSION N/A 06/08/2016   Procedure: TRANSESOPHAGEAL ECHOCARDIOGRAM (TEE);  Surgeon: [name], MD;  Location: [facility];  Service: Open Heart Surgery;  Laterality: N/A;  . TUBAL LIGATION    . VAGINAL HYSTERECTOMY  05/2003    Family History  Problem Relation Age of Onset  . Heart disease Mother   . Cervical cancer Sister   . Prostate cancer Brother   . Colon cancer Neg Hx     Social History   Socioeconomic History  . Marital status: Divorced    Spouse name: Not on file  . Number of children: Not on file  . Years of education: Not on file  . Highest education level: Not on file  Occupational History  . Occupation: Retired  Scientific laboratory technician  . Financial  resource strain: Not on file  . Food insecurity:    Worry: Not on file    Inability: Not on file  . Transportation needs:    Medical: Not on file    Non-medical: Not on file  Tobacco Use  . Smoking status: Former Smoker    Packs/day: 0.12    Years: 45.00    Pack years: 5.40    Types: Cigarettes    Last attempt to quit: 11/16/2016    Years since quitting: 1.2  . Smokeless tobacco: Never Used  Substance and Sexual Activity  . Alcohol use: Yes    Alcohol/week: 8.4 oz    Types: 7 Cans of beer, 7 Standard drinks or equivalent per week    Comment: 1 beer/day  . Drug use: No    Types: Cocaine    Comment: 05/31/2017 "nothing since ~ 2016"  . Sexual activity: Never  Lifestyle  . Physical activity:    Days per week: Not on file    Minutes per session: Not on file  . Stress: Not on file  Relationships  . Social connections:    Talks on phone: Not on file    Gets together: Not on file    Attends religious service: Not on file    Active member of club or organization: Not on file    Attends meetings of clubs or organizations: Not on file    Relationship status:  Not on file  . Intimate partner violence:    Fear of current or ex partner: Not on file    Emotionally abused: Not on file    Physically abused: Not on file    Forced sexual activity: Not on file  Other Topics Concern  . Not on file  Social History Narrative   Pt lives with sister and 2 nieces.   Review of systems: Review of Systems  Constitutional: Negative for fever and chills.  HENT: Negative.   Eyes: Negative for blurred vision.  Respiratory: as per HPI  Cardiovascular: Negative for chest pain and palpitations.  Gastrointestinal: Negative for vomiting, diarrhea, blood per rectum. Genitourinary: Negative for dysuria, urgency, frequency and hematuria.  Musculoskeletal: Negative for myalgias, back pain and joint pain.  Skin: Negative for itching and rash.  Neurological: Negative for dizziness, tremors, focal weakness, seizures and loss of consciousness.  Endo/Heme/Allergies: Negative for environmental allergies.  Psychiatric/Behavioral: Negative for depression, suicidal ideas and hallucinations.  All other systems reviewed and are negative.  Physical Exam: Blood pressure 130/74, pulse 80, height _0  (1.575 m), weight 244 lbs, last menstrual period 12/30/1994, SpO2 94 %. Gen:      No acute distress HEENT:  EOMI, sclera anicteric Neck:     No masses; no thyromegaly Lungs:    Scattered exp wheeze; normal respiratory effort CV:         Regular rate and rhythm; no murmurs Abd:      + bowel sounds; soft, non-tender; no palpable masses, no distension Ext:   1-2+ edema; adequate peripheral perfusion Skin:      Warm and dry; no rash Neuro: alert and oriented x 3 Psych: normal mood and affect  Data Reviewed: PFTs 06/04/16 FVC 1.89 (76%), FEV1 1.36 (69%),F/F 72,TLC 78%, DLCO 44% Moderate obstruction, minimal restriction, severe diffusion defect  CT scan 11/24/11-stable subcentimeter pulmonary nodules compared to 2009 Chest x-ray 11/26/16-no pneumonia, effusion. Probable mild chronic  fibrotic changes. CT high resolution 03/11/17-no interstitial lung disease, moderate emphysema, enlarged pulmonary trunk, aortic atherosclerosis. Multiple small pulmonary nodules stable since  2013. CT angiogram 09/16/17-diffuse emphysematous changes, stable pulmonary nodules.  No interstitial lung disease. Chest x-ray 11/12/17-bilateral interstitial opacities consistent with CHF. I reviewed all images personally  Sleep study 10/31/17 mild OSA -(AHI) was 11.9 per hour.  ED visit 3/26 EKG-ST depressions in inferior and lateral leads with T wave inversion Troponin 0 0.01, BNP 1044 BMP, CBC within normal limits Chest x-ray hyperinflated lungs, mild vascular congestion, CABG changes.  I reviewed the images personally.  Assessment:  Acute visit for dyspnea, hypoxia Concerned that she may be having a CHF exacerbation, possible cardiac ischemia with weight gain, lower extremity edema, EKG changes on 3/26.  She left without being seen in the ED that day She has significant cardiac history of coronary artery disease, CABG, pulmonary hypertension Do not suspect COPD is playing a big role in her presentation today. I sent her back to the ED and instructed her to stay this time.  COPD GOLD D Continue on trelegy Daliresp.  Continue albuterol as needed.  Continue supplemental oxygen  H/O Lupus She has history of lupus which appears to have been confined to the skin previously.  CT scan of the chest does not show any ILD. We will continue to monitor.    Former smoker Has stopped smoking since January 2018.   SubCm pulmonary nodules Likely benign as they have been stable since 2013.  She will need to start low-dose screening CT of the chest starting in Nov 2019 (1 year from last CT scan)  Obstructive sleep apnea AutoSet CPAP ordered.  Awaiting initiation of therapy.  Plan/Recommendations: - EMS called.  Sent to ED - Continue O2, trelegy, Daliresp duo nebs - Awaiting CPAP initiation.  Marshell Garfinkel MD [facility] Pager [phone] 02/11/2018, 10:01 AM  CC: Ledell Noss, MD

## 2018-02-11 NOTE — ED Triage Notes (Signed)
PT presents to ED from [place] with SOB with exertion "for as long as I remember". Pt was seen in ED 3/26 but left AMA.  EMS reports spo2 drops to mid 70's% on 2 lpm with minimal exertion. Pt denies cp. A&Ox4, speaking in complete sentences.   116/88 Hr 76 rr 20 spo2 93 2lpm [place]

## 2018-02-11 NOTE — Progress Notes (Signed)
*  Preliminary Results* Left lower extremity venous duplex completed. Left lower extremity is negative for deep vein thrombosis. There is no evidence of left Baker's cyst.  02/11/2018 4:03 PM  Maudry Mayhew, BS, RVT, RDCS, RDMS

## 2018-02-12 ENCOUNTER — Other Ambulatory Visit: Payer: Self-pay

## 2018-02-12 ENCOUNTER — Inpatient Hospital Stay (HOSPITAL_COMMUNITY): Payer: Medicare Other

## 2018-02-12 DIAGNOSIS — I361 Nonrheumatic tricuspid (valve) insufficiency: Secondary | ICD-10-CM

## 2018-02-12 DIAGNOSIS — R768 Other specified abnormal immunological findings in serum: Secondary | ICD-10-CM

## 2018-02-12 DIAGNOSIS — E876 Hypokalemia: Secondary | ICD-10-CM

## 2018-02-12 DIAGNOSIS — Z7901 Long term (current) use of anticoagulants: Secondary | ICD-10-CM

## 2018-02-12 DIAGNOSIS — J9611 Chronic respiratory failure with hypoxia: Secondary | ICD-10-CM

## 2018-02-12 DIAGNOSIS — I480 Paroxysmal atrial fibrillation: Secondary | ICD-10-CM

## 2018-02-12 DIAGNOSIS — I5042 Chronic combined systolic (congestive) and diastolic (congestive) heart failure: Secondary | ICD-10-CM

## 2018-02-12 DIAGNOSIS — Z955 Presence of coronary angioplasty implant and graft: Secondary | ICD-10-CM

## 2018-02-12 DIAGNOSIS — I2729 Other secondary pulmonary hypertension: Secondary | ICD-10-CM

## 2018-02-12 DIAGNOSIS — Z7902 Long term (current) use of antithrombotics/antiplatelets: Secondary | ICD-10-CM

## 2018-02-12 DIAGNOSIS — R7689 Other specified abnormal immunological findings in serum: Secondary | ICD-10-CM

## 2018-02-12 LAB — URINALYSIS, ROUTINE W REFLEX MICROSCOPIC
Bilirubin Urine: NEGATIVE
GLUCOSE, UA: NEGATIVE mg/dL
Hgb urine dipstick: NEGATIVE
Ketones, ur: NEGATIVE mg/dL
LEUKOCYTES UA: NEGATIVE
Nitrite: NEGATIVE
PROTEIN: NEGATIVE mg/dL
SPECIFIC GRAVITY, URINE: 1.013 (ref 1.005–1.030)
pH: 5 (ref 5.0–8.0)

## 2018-02-12 LAB — BASIC METABOLIC PANEL
ANION GAP: 14 (ref 5–15)
BUN: 16 mg/dL (ref 6–20)
CALCIUM: 9.4 mg/dL (ref 8.9–10.3)
CO2: 21 mmol/L — AB (ref 22–32)
Chloride: 102 mmol/L (ref 101–111)
Creatinine, Ser: 1.12 mg/dL — ABNORMAL HIGH (ref 0.44–1.00)
GFR calc non Af Amer: 52 mL/min — ABNORMAL LOW (ref >60)
Glucose, Bld: 93 mg/dL (ref 65–99)
POTASSIUM: 4 mmol/L (ref 3.5–5.1)
Sodium: 137 mmol/L (ref 135–145)

## 2018-02-12 LAB — CBC
HEMATOCRIT: 45.1 % (ref 36.0–46.0)
HEMOGLOBIN: 14.3 g/dL (ref 12.0–15.0)
MCH: 27.7 pg (ref 26.0–34.0)
MCHC: 31.7 g/dL (ref 30.0–36.0)
MCV: 87.2 fL (ref 78.0–100.0)
Platelets: 230 10*3/uL (ref 150–400)
RBC: 5.17 MIL/uL — ABNORMAL HIGH (ref 3.87–5.11)
RDW: 18.3 % — ABNORMAL HIGH (ref 11.5–15.5)
WBC: 6 10*3/uL (ref 4.0–10.5)

## 2018-02-12 LAB — MAGNESIUM: MAGNESIUM: 2.1 mg/dL (ref 1.7–2.4)

## 2018-02-12 LAB — GLUCOSE, CAPILLARY: GLUCOSE-CAPILLARY: 91 mg/dL (ref 65–99)

## 2018-02-12 MED ORDER — ORAL CARE MOUTH RINSE
15.0000 mL | Freq: Two times a day (BID) | OROMUCOSAL | Status: DC
  Administered 2018-02-12 – 2018-02-16 (×6): 15 mL via OROMUCOSAL

## 2018-02-12 MED ORDER — KETOROLAC TROMETHAMINE 30 MG/ML IJ SOLN
30.0000 mg | Freq: Once | INTRAMUSCULAR | Status: AC
  Administered 2018-02-12: 30 mg via INTRAVENOUS
  Filled 2018-02-12: qty 1

## 2018-02-12 MED ORDER — DICLOFENAC SODIUM 1 % TD GEL
4.0000 g | Freq: Four times a day (QID) | TRANSDERMAL | Status: DC | PRN
  Administered 2018-02-12: 09:00:00 4 g via TOPICAL
  Filled 2018-02-12: qty 100

## 2018-02-12 MED ORDER — TORSEMIDE 20 MG PO TABS
60.0000 mg | ORAL_TABLET | Freq: Every day | ORAL | Status: DC
  Administered 2018-02-12 – 2018-02-16 (×5): 60 mg via ORAL
  Filled 2018-02-12 (×5): qty 3

## 2018-02-12 MED ORDER — METHOCARBAMOL 500 MG PO TABS
500.0000 mg | ORAL_TABLET | Freq: Four times a day (QID) | ORAL | Status: DC | PRN
  Administered 2018-02-12 – 2018-02-16 (×7): 500 mg via ORAL
  Filled 2018-02-12 (×7): qty 1

## 2018-02-12 MED ORDER — MORPHINE SULFATE (PF) 2 MG/ML IV SOLN
2.0000 mg | INTRAVENOUS | Status: DC | PRN
  Administered 2018-02-12 – 2018-02-13 (×5): 2 mg via INTRAVENOUS
  Filled 2018-02-12 (×5): qty 1

## 2018-02-12 NOTE — Progress Notes (Addendum)
Initially told pt has chronic musculoskeletal pain that she takes 2 extra strength tylenol daily for at home.  Therefore ordered extra tylenol dose and a tramadol dose if needed.  Called again about 2 hours later that pt still having pain now rated 7/10.  No mention of any pain to a physician when pt first arrived.  Went to evaluate pt. Pt complaining of severe flank pain on right side, began 1-2 days ago but now worse.  Denies dysuria, increased frequency or hematuria,  CVA tenderness on exam, colicky pain, pt adjusting position in bed for attempt at relief suspicious for renal stone.  No prior history of renal stones per pt.  Blood pressure 120/78, HR 87, SpO2 95%, RR 20, will give 14m IV morphine, placed on continuous pulse ox, ordered renal stone CT study, will also check a UA.

## 2018-02-12 NOTE — Progress Notes (Signed)
Patient still hurting despite pain medications given around the clock.  CT results still pending.   Janos Shampine, RN

## 2018-02-12 NOTE — Progress Notes (Signed)
Pt was complaining on her right flank pain since AM, after Voveran gel and heat packed applied did not help that much along with morphine IV, later Toradol IV helped pt to decrease her pain . Pt is getting up and using BSC, tolerating diet and medicine, will continue to monitor the patient  Palma Holter, RN

## 2018-02-12 NOTE — Progress Notes (Signed)
  Echocardiogram 2D Echocardiogram has been performed.  Elizabeth [name] 02/12/2018, 4:51 PM

## 2018-02-12 NOTE — Progress Notes (Signed)
Pt educated about safety and importance of bed alarm during the night however pt refuses to be on bed alarm. Will continue to round on patient.   Kyreese Chio, RN    

## 2018-02-12 NOTE — Progress Notes (Signed)
UA been sent  Palma Holter, RN

## 2018-02-12 NOTE — Progress Notes (Signed)
Patient complains of right side flank pain. She stated that when she is still pain is at 7, when she is moving pain is at 10. Tylenol 500 mg and additional dose of 650 Tylenol given and not effective. Tramadol given as well and minimally effective per patient.No PRN to give. VS stable.  Paged IM  resident on call and informed.  Will continue to monitor.  Louisa Favaro, RN

## 2018-02-12 NOTE — Progress Notes (Signed)
Subjective:  Patient seen and examined. She had new onset colicky flank pain last night, with CVA tenderness on exam concerning for renal stone. CT renal negative. She is still experiencing pain in her right side that is worse with movement. She has had this pain for 2 days duration, no inciting event that she can recall. Pain is not worse with deep inspiration.   Dyspnea on exertion remains stable, no significant improvement or worsening. Denies wheezing or productive cough. She states she continues to have little UOP despite received lasix. Denies dysuria.   Objective:  Vital signs in last 24 hours: Vitals:   02/12/18 0227 02/12/18 0458 02/12/18 0630 02/12/18 0635  BP: 120/78     Pulse: 87 90  96  Resp:  20  20  Temp:      TempSrc:      SpO2:  93%  96%  Weight:   217 lb 13 oz (98.8 kg)    General: Laying in bed comfortably, NAD HEENT: [place]/AT, EOMI, no scleral icterus, PERRL Cardiac: RRR, No R/M/G appreciated Pulm: normal effort, CTAB Abd: soft, non tender, non distended, BS normal Ext: extremities well perfused, no peripheral edema Neuro: alert and oriented X3, cranial nerves II-XII grossly intact   Assessment/Plan:  Active Problems:   COPD, group D, by GOLD 2017 classification (HCC)   Acute on chronic respiratory failure with hypoxia (HCC)   Acute on chronic combined systolic and diastolic CHF (congestive heart failure) (HCC)   Pulmonary hypertension (HCC)   Dyspnea  Dyspnea on Exertion  Patient has an extensive cardiac history including CAD s/p CABG, chronic combined systolic and diastolic heart failure presenting with subacute worsening dyspnea on exertion. Initial labs relatively unremarkable and CXR without significant pulmonary edema. Patient does not appear overtly hypervolemic and has faint bibasilar crackles, and no wheezing on respiratory examination. I do not think her dyspnea is an acute COPD exacerbation. Presentation likely acute heart failure exacerbation  given non-adherence to diuretics, but she does not appear volume up. Last ECHO in 09/2017 with  estimated ejection fraction was in therange of 40% to 45% and G1DD.  -Cardiac monitoring  -Echocardiogram pending -f/u results  -Strict I/Os - patient has only put out 300 cc overnight s/p 80 mg of IV lasix -Will hold on further diuresis until echo results as patient is euvolemic on examination and has had minimal output with IV diuresis  -Daily weights - 217 lbs, at baseline -Supplemental O2  -PT/OT -Metoprolol 50 mg daily   Right sided Flank Pain  CT renal negative for renal stone or hydronephrosis, bladder unremarkable. UA pending, but denies dysuria. Pain worsened by movement and TTP on exam seems more musculoskeletal in nature.  -Volatren gel QID PRN -Morphine 2 mg q4 hours -Heating pad -Will continue to monitor    COPD Pulmonary Hypertension Patient on 2-4 liter of supplemental oxygen continuously, no wheezing on examination. COPD likely a component of her dyspnea at baseline, it is possible that progressing COPD and pulmonary hypertension could be contributing to her subacute and progressing dyspnea on exertion and hypoxia.  -Repeating echocardiogram -Duoneb PRN for SOB or wheezing -Continue Trelegy -Supplemental O2  CAD s/p CABG and DES R/LH cath in 09/2017 revealed severe native CAD. Patient denies active chest pain.  -Continue medical management with Crestor 20 mg daily and Plavix 75 mg daily  Paroxysmal A. Fib on chronic anticoagulation -Cardiac monitoring -Currently in NSR -Continue Eliquis  Hypokalemia and Hypomagnesia Resolved. Mag 2.1, K 4.0.  -Will continue to  monitor and replace as needed   Dispo: Anticipated discharge in approximately 2-3 day(s).   Melanee Spry, MD 02/12/2018, 6:37 AM Pager: [phone]

## 2018-02-13 ENCOUNTER — Inpatient Hospital Stay (HOSPITAL_COMMUNITY): Payer: Medicare Other

## 2018-02-13 DIAGNOSIS — R10A1 Flank pain, right side: Secondary | ICD-10-CM

## 2018-02-13 DIAGNOSIS — R760 Raised antibody titer: Secondary | ICD-10-CM

## 2018-02-13 DIAGNOSIS — R109 Unspecified abdominal pain: Secondary | ICD-10-CM

## 2018-02-13 LAB — ECHOCARDIOGRAM COMPLETE
Height: 62 in
Weight: 3485.03 oz

## 2018-02-13 LAB — BASIC METABOLIC PANEL
ANION GAP: 12 (ref 5–15)
BUN: 21 mg/dL — ABNORMAL HIGH (ref 6–20)
CALCIUM: 9.3 mg/dL (ref 8.9–10.3)
CO2: 20 mmol/L — AB (ref 22–32)
CREATININE: 1.14 mg/dL — AB (ref 0.44–1.00)
Chloride: 105 mmol/L (ref 101–111)
GFR, EST AFRICAN AMERICAN: 59 mL/min — AB (ref >60)
GFR, EST NON AFRICAN AMERICAN: 51 mL/min — AB (ref >60)
GLUCOSE: 95 mg/dL (ref 65–99)
Potassium: 4.9 mmol/L (ref 3.5–5.1)
Sodium: 137 mmol/L (ref 135–145)

## 2018-02-13 LAB — GLUCOSE, CAPILLARY: Glucose-Capillary: 107 mg/dL — ABNORMAL HIGH (ref 65–99)

## 2018-02-13 LAB — MAGNESIUM: Magnesium: 2 mg/dL (ref 1.7–2.4)

## 2018-02-13 MED ORDER — TRIAMCINOLONE ACETONIDE 40 MG/ML IJ SUSP
20.0000 mg | Freq: Once | INTRAMUSCULAR | Status: AC
  Administered 2018-02-13: 20 mg via INTRADERMAL
  Filled 2018-02-13: qty 0.5

## 2018-02-13 MED ORDER — LIDOCAINE HCL (PF) 1 % IJ SOLN
5.0000 mL | Freq: Once | INTRAMUSCULAR | Status: AC
  Administered 2018-02-13: 2.5 mL via INTRADERMAL
  Filled 2018-02-13: qty 5

## 2018-02-13 MED ORDER — ACETAMINOPHEN 500 MG PO TABS
1000.0000 mg | ORAL_TABLET | Freq: Four times a day (QID) | ORAL | Status: DC | PRN
  Administered 2018-02-13 – 2018-02-15 (×6): 1000 mg via ORAL
  Filled 2018-02-13 (×6): qty 2

## 2018-02-13 MED ORDER — IPRATROPIUM-ALBUTEROL 0.5-2.5 (3) MG/3ML IN SOLN
3.0000 mL | RESPIRATORY_TRACT | Status: DC
  Administered 2018-02-13 – 2018-02-15 (×10): 3 mL via RESPIRATORY_TRACT
  Filled 2018-02-13 (×12): qty 3

## 2018-02-13 NOTE — Progress Notes (Signed)
Consult call taken from remote  Patient admited via Pulm clnic -> ER -> IMTS with supsicin of CHF (baseline ef 45%) Known to have gold stage 2 copd based on 2017 PFT Admits 2018 and 2019 for flu and MPV respectivly Last CT July 2018 with R CHF and pulm htn and emphhysema   Currently issue is dyspnea. Resident does not think AECOPD PAtient on MDI - ics/laba/lama - incruse/broe Patient wants pulm consult for 2nd opinion  ECHO This admit with PASP 40 and ef 45%  Plan Check ABG Order spirometry Change MDI to nebs Get ct chest wo contrast Pulm consult in AM    Dr. Brand Males, M.D., [facility].C.P Pulmonary and Critical Care Medicine Staff Physician, [place] Director - Interstitial Lung Disease  Program  Pulmonary [place] at [place], [place], [address]  Pager: [phone], If no answer or between  15:00h - 7:00h: call [phone] Telephone: [phone]

## 2018-02-13 NOTE — Progress Notes (Addendum)
Patient not able to stand up this morning for weight due to pain despite pain medications given prior to doing weight. Still continues to complain of flank pain.  Bed weight obtained.  Allisha Harter, RN

## 2018-02-13 NOTE — Discharge Instructions (Signed)

## 2018-02-13 NOTE — Progress Notes (Signed)
Paged Dr on call to report that patient was in severe pain that was not controlled with morphine. Dr to see patient will continue to monitor.

## 2018-02-13 NOTE — Progress Notes (Signed)
Subjective: Elizabeth [name] feels that her shortness of breath is at baseline now. Unfortunately, the baseline level of breathing is very uncomfortable for her. She has no shortness of breath at rest but does develop increased work of breathing with exertion. At times while she is lying down she will have a feeling of shortness of breath that resolves when she sits up. She does not have a cough or peripheral edema. She says that the pulse ox did drop once while she was sleeping and once when she used the bathroom and the nasal canula was removed. She is requesting a second opinion from pulmonology.   She does continue to have the uncomfortable pain on her side. She is able to identify one point where it hurst the most. She has been tolerating po intake, denies abdominal pain or dysuria. The morphine and voltaren have not helped with the pain, toradol helped but unfortunately now there is a creatinine elevation.   Objective:  Vital signs in last 24 hours: Vitals:   02/13/18 0813 02/13/18 1000 02/13/18 1150 02/13/18 1152  BP:  (!) 102/55 111/72   Pulse:  86 72   Resp:   18   Temp:   97.6 F (36.4 C)   TempSrc:   Oral   SpO2: 100%  92% 93%  Weight:      Height:       General: well appearing, no acute distress  Cardiac: regular rate and rhythm, no peripheral edema  Pulmonary: speaking in full sentences, pursed lips breathing and accessory muscle use some points, lungs are clear to auscultation, no wheezes or rales  GI: BS+, soft, non tender  MSK: there is a tenderness to deep palpation over the right flank   Assessment/Plan:  [name] with hx of COPD, chronic respiratory failure on 2 liters home oxygen, pulmonary HTN, chronic combined systolic and diastolic biventricularCHF (EF 40-45%, G1DD), CAD s/p CABG who presented with hypoxia and right side pain. She was at pulmonology office visit on 3/29 with shortness of breath and desated to 88% with ambulation, there was concern  for volume overload so she was advised to present to the ED.        Acute on chronic respiratory failure with hypoxia (HCC)   Acute on chronic combined systolic and diastolic biventricular CHF (congestive heart failure) (HCC)   Pulmonary hypertension (HCC)   Dyspnea   Anticentromere antibodies present - dyspnea not improved with IV lasix, euvolemic on exam today, weight is at baseline  -echo 3/30 with EF 45%, G1DD with mild left atrial dilation, severe right ventricular dilation with increased wall thickness, septal flattening, PA pressure 47 mmHg, IVC normal in size  - echo results recommended evaluation for cardiac amyloidosis  - continue supplemental O2 for goal SpO2 88%  - pulm consulted for opinion regarding COPD and pulmonary hypertension, appreciate their recommendations  - CT chest, ABG, and PFTs ordered   - duonebs ordered  - no calcinosis, raynauds, esophogeal dysmotility, sclerodactyly, or telengiectasias    Right side pain  A trigger point injection was performed at the site of maximal tenderness using 1% plain Lidocaine and Kenalog 20 mg. This was well tolerated, and followed by moderate relief of pain.  - tylenol 1000 mg PRN  - d/c morphine as this is not working   COPD, group D, by GOLD 2017 classification ([place]) Pulmonary Hypertension -Duoneb PRN for SOB or wheezing -Continue Trelegy -Supplemental O2  CAD s/p CABG and DES R/LH  cath in 09/2017 revealed severe native CAD. Patient denies active chest pain.  -Continue medical management with Crestor 20 mg daily and Plavix 75 mg daily  Paroxysmal A. Fibon chronic anticoagulation - NSR on exam  -Continue Eliquis  Hypokalemia and Hypomagnesia Resolved   Dispo: Anticipated discharge in approximately 1 day(s).   Ledell Noss, MD 02/13/2018, 3:11 PM Pager: [phone]

## 2018-02-14 ENCOUNTER — Ambulatory Visit (HOSPITAL_COMMUNITY): Payer: Medicare Other

## 2018-02-14 DIAGNOSIS — Z9981 Dependence on supplemental oxygen: Secondary | ICD-10-CM

## 2018-02-14 DIAGNOSIS — J449 Chronic obstructive pulmonary disease, unspecified: Secondary | ICD-10-CM

## 2018-02-14 DIAGNOSIS — J9621 Acute and chronic respiratory failure with hypoxia: Secondary | ICD-10-CM

## 2018-02-14 DIAGNOSIS — R0600 Dyspnea, unspecified: Secondary | ICD-10-CM

## 2018-02-14 DIAGNOSIS — I110 Hypertensive heart disease with heart failure: Principal | ICD-10-CM

## 2018-02-14 DIAGNOSIS — Z79899 Other long term (current) drug therapy: Secondary | ICD-10-CM

## 2018-02-14 DIAGNOSIS — I2510 Atherosclerotic heart disease of native coronary artery without angina pectoris: Secondary | ICD-10-CM

## 2018-02-14 DIAGNOSIS — I5043 Acute on chronic combined systolic (congestive) and diastolic (congestive) heart failure: Secondary | ICD-10-CM

## 2018-02-14 DIAGNOSIS — Z951 Presence of aortocoronary bypass graft: Secondary | ICD-10-CM

## 2018-02-14 DIAGNOSIS — I2721 Secondary pulmonary arterial hypertension: Secondary | ICD-10-CM

## 2018-02-14 LAB — BASIC METABOLIC PANEL
Anion gap: 13 (ref 5–15)
BUN: 20 mg/dL (ref 6–20)
CALCIUM: 9 mg/dL (ref 8.9–10.3)
CO2: 22 mmol/L (ref 22–32)
CREATININE: 0.95 mg/dL (ref 0.44–1.00)
Chloride: 101 mmol/L (ref 101–111)
GFR calc non Af Amer: >60 mL/min (ref >60)
GLUCOSE: 94 mg/dL (ref 65–99)
Potassium: 4.2 mmol/L (ref 3.5–5.1)
Sodium: 136 mmol/L (ref 135–145)

## 2018-02-14 LAB — BLOOD GAS, ARTERIAL
Acid-base deficit: 1.6 mmol/L (ref 0.0–2.0)
Bicarbonate: 22.1 mmol/L (ref 20.0–28.0)
Drawn by: 246861
O2 CONTENT: 4 L/min
O2 SAT: 98.6 %
PATIENT TEMPERATURE: 98.6
pCO2 arterial: 33.6 mmHg (ref 32.0–48.0)
pH, Arterial: 7.432 (ref 7.350–7.450)
pO2, Arterial: 127 mmHg — ABNORMAL HIGH (ref 83.0–108.0)

## 2018-02-14 LAB — GLUCOSE, CAPILLARY: Glucose-Capillary: 97 mg/dL (ref 65–99)

## 2018-02-14 LAB — PROTIME-INR
INR: 1.22
PROTHROMBIN TIME: 15.3 s — AB (ref 11.4–15.2)

## 2018-02-14 LAB — CK: Total CK: 89 U/L (ref 38–234)

## 2018-02-14 MED ORDER — SODIUM CHLORIDE 0.9% FLUSH
3.0000 mL | Freq: Two times a day (BID) | INTRAVENOUS | Status: DC
  Administered 2018-02-14: 3 mL via INTRAVENOUS

## 2018-02-14 MED ORDER — ACETAMINOPHEN 500 MG PO TABS: 1000.0000 mg | ORAL_TABLET | Freq: Four times a day (QID) | ORAL | 2 refills | Status: DC | PRN

## 2018-02-14 MED ORDER — SODIUM CHLORIDE 0.9% FLUSH: 3.0000 mL | INTRAVENOUS | Status: DC | PRN

## 2018-02-14 MED ORDER — ASPIRIN 81 MG PO CHEW
81.0000 mg | CHEWABLE_TABLET | ORAL | Status: AC
  Administered 2018-02-15: 81 mg via ORAL
  Filled 2018-02-14: qty 1

## 2018-02-14 MED ORDER — SODIUM CHLORIDE 0.9 % IV SOLN: INTRAVENOUS | Status: DC

## 2018-02-14 MED ORDER — SODIUM CHLORIDE 0.9 % IV SOLN: 250.0000 mL | INTRAVENOUS | Status: DC | PRN

## 2018-02-14 NOTE — Consult Note (Addendum)
Advanced Heart Failure Team Consult Note   Primary Physician: Ledell Noss, MD PCP-Cardiologist:  No primary care provider on file.  HF MD: Dr Haroldine Laws  Reason for Consultation: Heart Failure   HPI:    Elizabeth [name] is seen today for evaluation of heart failure  at the request of Dr Vaughan Browner.   Elizabeth [name] is a 62 year old with a history of CAD, COPD, HTN, hyperlipidemia, fibromyalgia, GERD, PAF formerly on amio now on eliquis, remote cocaine use, and lupus. On chronic oxygen 4 liters at home.   Admitted November 2018 with increased dyspnea. Diuresed with IV lasix and transitioned to torsemide 40 mg daily. Had LHC/RHC as noted below with stent placed. Found to have moderate PAH which was felt to be multifactorial. Not started on pulmonary vasodilators.  Discharge weight 219 pounds.    Admitted 02/11/2018 with severe dyspnea related to a/c diastolic heart failure. NYHA IIIB symptoms. She had not been taking diuretics as ordered. She said she would only take torsemide if she had swelling.  She has been diuresing with IV lasix but remains dyspneic after just a few steps.Says she has not noted any difference. No CP.  ECHO repeat and showed progressive RV failure. CXR was not concerning for edema.   CT chest 02/13/2018  1. Stable appearing bilateral pulmonary nodules the largest are in the right middle lobe measuring up to 6.6 mm dating back to 2009 consistent with benign findings. 2. Stable 12 mm left thyroid nodule. 3. Diffuse moderate centrilobular emphysema without acute pneumonic consolidation or CHF. 4. Status post CABG. 5. Ectatic, atherosclerotic ascending thoracic aorta to 3.9 cm.  Echos -02/12/2018. Both the LV and RV are thick. LVEF 45%. RV is markedly dilated   and hypokinetic with septal flattening suggesting elevated   R-sided pressures. With constellation of findings would consider   evalaution for possible cardiac amyloidosis. With previous h/o   CABG cannot exclude a  component of pericardial constriction   though lack of IVC dilation makes this unlikely. .   -05/31/17 LVEF 50-55%, Grade 2 DD, Mild RV dilation, Mod RAE, Mod TR, PA peak pressure 69 mm Hg  L/RHC 09/20/17  Prox Cx to Mid Cx lesion is 35% stenosed.  Prox RCA to Mid RCA lesion is 100% stenosed.  SVG-RCA: Prox Graft lesion is 50% stenosed.  Prox LAD lesion is 50% stenosed. Prox LAD to Mid LAD lesion is 75% stenosed at 2nd Diag. There is competitive flow from the LIMA graft distally.  Ost 1st Diag lesion is 70% stenosed. mid 1st Diag 70%  LIMA-dLAD was very difficult to engage via femoral access. With nonselective angiography of the graft is widely patent to the very distal LAD. Competitive flow noted back to the second diagonal branch.  Ost 3rd Mrg-1 lesion is 100% stenosed. -Flush occlusion from the native circumflex  Ost 3rd Mrg-2 lesion is 85% stenosed at the anastomosis site of the SVG  Mid Cx to Dist Cx lesion is 80% stenosed.  Dist Cx lesion is 75% stenosed. Just prior to insertion of second limb of SVG-OM3-dCx  SVG-OM3-dCx mid Graft to Dist Graft lesion before Ost 3rd Mrg is 80% stenosed -prior to insertion on 3rd Mrg RHC Procedural Findings: Hemodynamics (mmHg) RA mean 12 RV 64/6 PA 64/28 (41) PCWP 19 AO 131/82 Cardiac Output (Fick) 4.54 Cardiac Index (Fick) 2.25 TPG 22 PVR 5   PFTs 06/04/2016  Showed an FEV1 to FEC ratio of 72%. DLCO was reduced to 46% of predicted. Vital  capacity was 78% of predicted. FEV1 1.21L (62%) FVC 1.89 (76%) Ratio 62% DLCO 44%    Review of Systems: [y] = yes, _0  = no   General: Weight gain [Y ]; Weight loss _1 ; Anorexia _2 ; Fatigue [Y ]; Fever _3 ; Chills _4 ; Weakness [Y ]  Cardiac: Chest pain/pressure _5 ; Resting SOB [Y ]; Exertional SOB [ Y]; Orthopnea [Y ]; Pedal Edema _6 ; Palpitations _7 ; Syncope _8 ; Presyncope _9 ; Paroxysmal nocturnal dyspnea_10   Pulmonary: Cough _11 ; Wheezing_12 ; Hemoptysis_13 ; Sputum _14 ; Snoring _15     GI: Vomiting_16 ; Dysphagia_17 ; Melena_18 ; Hematochezia _19 ; Heartburn_20 ; Abdominal pain _21 ; Constipation _22 ; Diarrhea _23 ; BRBPR _24   GU: Hematuria_25 ; Dysuria _26 ; Nocturia_27   Vascular: Pain in legs with walking _28 ; Pain in feet with lying flat _29 ; Non-healing sores _30 ; Stroke _31 ; TIA _32 ; Slurred speech _33 ;  Neuro: Headaches_34 ; Vertigo_35 ; Seizures_36 ; Paresthesias_37 ;Blurred vision _38 ; Diplopia _39 ; Vision changes _40   Ortho/Skin: Arthritis [Y ]; Joint pain [ Y]; Muscle pain _41 ; Joint swelling _42 ; Back Pain _43 ; Rash _44   Psych: Depression_45 ; Anxiety_46   Heme: Bleeding problems _47 ; Clotting disorders _48 ; Anemia _49   Endocrine: Diabetes _50 ; Thyroid dysfunction_51   Home Medications Prior to Admission medications   Medication Sig Start Date End Date Taking? Authorizing Provider  acetaminophen (TYLENOL) 500 MG tablet Take 500 mg by mouth every 6 (six) hours as needed for mild pain.   Yes [provider]  albuterol (PROVENTIL HFA;VENTOLIN HFA) 108 (90 Base) MCG/ACT inhaler Inhale 2 puffs into the lungs every 6 (six) hours as needed for wheezing or shortness of breath. 12/08/16  Yes Holley Raring, MD  apixaban (ELIQUIS) 5 MG TABS tablet Take 1 tablet (5 mg total) by mouth 2 (two) times daily. 05/18/17  Yes Ledell Noss, MD  clopidogrel (PLAVIX) 75 MG tablet Take 1 tablet (75 mg total) daily by mouth. 09/24/17  Yes Barrett, Evelene Croon, PA-C  colchicine 0.6 MG tablet Take 1 tablet (0.6 mg total) by mouth as needed (once daily when you are having a flair of the gout). Patient taking differently: Take 0.6 mg by mouth daily as needed (once daily when you are having a flair of the gout).  02/08/18  Yes Ledell Noss, MD  fluticasone (FLONASE) 50 MCG/ACT nasal spray SHAKE LIQUID AND USE 2 SPRAYS IN EACH NOSTRIL DAILY Patient taking differently: SHAKE LIQUID AND USE 2 SPRAYS IN EACH NOSTRIL DAILY AS NEEDED FOR CONGESTION 11/17/17  Yes Ledell Noss, MD  Fluticasone-Umeclidin-Vilant (TRELEGY  ELLIPTA) 100-62.5-25 MCG/INH AEPB Inhale 1 puff into the lungs daily. 11/05/17  Yes Mannam, Praveen, MD  ipratropium-albuterol (DUONEB) 0.5-2.5 (3) MG/3ML SOLN Take 3 mLs by nebulization every 6 (six) hours as needed. Patient taking differently: Take 3 mLs by nebulization every 6 (six) hours as needed (shortness of breath/wheezing).  11/30/17  Yes Mannam, Praveen, MD  metoprolol succinate (TOPROL-XL) 50 MG 24 hr tablet Take 1 tablet (50 mg total) by mouth daily. 12/16/17  Yes Kilroy, Luke K, PA-C  nitroGLYCERIN (NITROSTAT) 0.4 MG SL tablet Place 1 tablet (0.4 mg total) under the tongue every 5 (five) minutes x 3 doses as needed for chest pain. 09/18/17  Yes Daune Perch, NP  OXYGEN Inhale 2-3 L into the lungs continuous.   Yes [provider]  pantoprazole (PROTONIX) 40 MG tablet TAKE 1 TABLET BY MOUTH DAILY Patient taking differently: TAKE 1 TABLET (40 MG) BY MOUTH DAILY 01/12/18  Yes Ledell Noss, MD  potassium chloride SA (K-DUR,KLOR-CON) 20 MEQ tablet Take 1 tablet (20 mEq total) by mouth daily. 09/18/17  Yes Daune Perch, NP  roflumilast (DALIRESP) 500 MCG TABS tablet Take 1 tablet (500 mcg total) by mouth daily. 11/30/17  Yes Mannam, Praveen, MD  torsemide (DEMADEX) 20 MG tablet Take 3 tablets (60 mg total) by mouth daily. OK to take an extra tablet daily as needed for weight gain or edema. Patient taking differently: Take 20-60 mg by mouth See admin instructions. Take 3 tablets (60 mg) by mouth every morning and 1 tablet (20 mg) at night 11/15/17  Yes Winfrey, Jenne Pane, MD  traZODone (DESYREL) 50 MG tablet Take 1 tablet (50 mg total) by mouth at bedtime. 02/08/18  Yes Ledell Noss, MD  albuterol (PROVENTIL) (2.5 MG/3ML) 0.083% nebulizer solution Take 3 mLs (2.5 mg total) by nebulization every 6 (six) hours as needed for wheezing or shortness of breath. Patient not taking: Reported on 02/11/2018 06/25/17   Chesley Mires, MD  rosuvastatin (CRESTOR) 20 MG tablet Take 1 tablet (20 mg total) by  mouth at bedtime. Patient not taking: Reported on 02/11/2018 07/02/17 07/02/18  Shela Leff, MD    Past Medical History: Past Medical History:  Diagnosis Date  . CAD (coronary artery disease)    NSTEMI 08/2011:  LHC 08/21/11: mLAD 60-70%, pCFX occluded, dRCA chronic occlusion with L-R collats, EF 40-45%, inf AK.  PCI:  BMS to CFX.  Marland Kitchen CHF (congestive heart failure) ([place]) 05/31/2017  . COPD (chronic obstructive pulmonary disease) ([place]) 11/17/2016  . Depression   . Fibroids   . GERD (gastroesophageal reflux disease)   . Gout   . HLD (hyperlipidemia)    Chol = 235, LDL = 156 (08/2010)  . Hypertension   . Lupus 2009   ANA + 10/2008, repeat ANA + (05/2009), on that visit 05/2009 following labs obtained RF <20, CRP <0.4, ANA titers 1:80,   . On home oxygen therapy    "2L; 24/7 (05/31/2017)  . Pulmonary nodules 05/2008   noted on CXR and CT 05/2008, repeat CT 10/2008 - Stable small bilateral pulmonary nodules measuring up to 6 m m  . Stroke [facility]) 04/2014   "mini stroke"; denies residual on 05/31/2017  . Toe pain, right 10/08/2017    Past Surgical History: Past Surgical History:  Procedure Laterality Date  . CARDIAC CATHETERIZATION N/A 06/02/2016   Procedure: Left Heart Cath and Coronary Angiography;  Surgeon: Belva Crome, MD;  Location: [place] [facility];  Service: Cardiovascular;  Laterality: N/A;  . COLONOSCOPY N/A 07/14/2013   Procedure: COLONOSCOPY;  Surgeon: Beryle Beams, MD;  Location: [facility];  Service: Endoscopy;  Laterality: N/A;  . CORONARY ANGIOPLASTY WITH STENT PLACEMENT    . CORONARY ARTERY BYPASS GRAFT N/A 06/08/2016   Procedure: CORONARY ARTERY BYPASS GRAFTING (CABG) X 4 Diagnostic Diagram (LIMA-LAD, SVG-OM-CFX, SVG-RCA) With Right Greater Saphenous vein endoscopic harvesting;  Surgeon: [name], MD;  Location: [place];  Service: Open Heart Surgery;  Laterality: N/A;  . CORONARY STENT INTERVENTION N/A 09/22/2017   Procedure: CORONARY STENT INTERVENTION;   Surgeon: Martinique, Peter M, MD;  Location: [place] [facility];  Service: Cardiovascular;  Laterality: N/A;  . FRACTURE SURGERY    . ORIF TIBIA PLATEAU Left 08/22/2013   Procedure: OPEN REDUCTION INTERNAL FIXATION (ORIF) TIBIAL PLATEAU;  Surgeon: Christia Reading  Maryla Morrow, MD;  Location: [place];  Service: Orthopedics;  Laterality: Left;  . RIGHT/LEFT HEART CATH AND CORONARY/GRAFT ANGIOGRAPHY N/A 09/20/2017   Procedure: RIGHT/LEFT HEART CATH AND CORONARY/GRAFT ANGIOGRAPHY;  Surgeon: Leonie Man, MD;  Location: [place] [facility];  Service: Cardiovascular;  Laterality: N/A;  . TEE WITHOUT CARDIOVERSION N/A 06/08/2016   Procedure: TRANSESOPHAGEAL ECHOCARDIOGRAM (TEE);  Surgeon: [name], MD;  Location: [place];  Service: Open Heart Surgery;  Laterality: N/A;  . TUBAL LIGATION    . VAGINAL HYSTERECTOMY  05/2003    Family History: Family History  Problem Relation Age of Onset  . Heart disease Mother   . Cervical cancer Sister   . Prostate cancer Brother   . Colon cancer Neg Hx     Social History: :Lives at home with her sister and 4 nieces.  Social History   Socioeconomic History  . Marital status: Divorced    Spouse name: Not on file  . Number of children: Not on file  . Years of education: Not on file  . Highest education level: Not on file  Occupational History  . Occupation: Retired  Scientific laboratory technician  . Financial resource strain: Not on file  . Food insecurity:    Worry: Not on file    Inability: Not on file  . Transportation needs:    Medical: Not on file    Non-medical: Not on file  Tobacco Use  . Smoking status: Former Smoker    Packs/day: 0.12    Years: 45.00    Pack years: 5.40    Types: Cigarettes    Last attempt to quit: 11/16/2016    Years since quitting: 1.2  . Smokeless tobacco: Never Used  Substance and Sexual Activity  . Alcohol use: Yes    Alcohol/week: 8.4 oz    Types: 7 Cans of beer, 7 Standard drinks or equivalent per week    Comment: 1 beer/day  . Drug use:  No    Types: Cocaine    Comment: 05/31/2017 "nothing since ~ 2016"  . Sexual activity: Never  Lifestyle  . Physical activity:    Days per week: Not on file    Minutes per session: Not on file  . Stress: Not on file  Relationships  . Social connections:    Talks on phone: Not on file    Gets together: Not on file    Attends religious service: Not on file    Active member of club or organization: Not on file    Attends meetings of clubs or organizations: Not on file    Relationship status: Not on file  Other Topics Concern  . Not on file  Social History Narrative   Pt lives with sister and 2 nieces.    Allergies:  No Known Allergies  Objective:    Vital Signs:   Temp:  [98 F (36.7 C)] 98 F (36.7 C) (04/01 0641) Pulse Rate:  [76-84] 78 (04/01 1217) Resp:  [17-19] 17 (04/01 0805) BP: (101-114)/(63-69) 114/67 (04/01 1217) SpO2:  [92 %-99 %] 95 % (04/01 1549) Weight:  [215 lb 6.4 oz (97.7 kg)] 215 lb 6.4 oz (97.7 kg) (04/01 0641) Last BM Date: 02/13/18  Weight change: Filed Weights   02/12/18 0630 02/13/18 0505 02/14/18 0641  Weight: 217 lb 13 oz (98.8 kg) 216 lb 11.4 oz (98.3 kg) 215 lb 6.4 oz (97.7 kg)    Intake/Output:   Intake/Output Summary (Last 24 hours) at 02/14/2018 1719 Last data filed at 02/14/2018  1449 Gross per 24 hour  Intake 1020 ml  Output 1850 ml  Net -830 ml      Physical Exam    General:  Tearful. Sitting in chair No resp difficulty HEENT: normal Neck: supple. JVP 9-10  . Carotids 2+ bilat; no bruits. No lymphadenopathy or thyromegaly appreciated. Cor: PMI nondisplaced. Regular rate & rhythm. 2/6 TR Lungs:occasion wheeze. on 4 liters oxygen.  Abdomen: obese, soft, nontender, nondistended. No hepatosplenomegaly. No bruits or masses. Good bowel sounds. Extremities: no cyanosis, clubbing, rash, edema Neuro: alert & orientedx3, cranial nerves grossly intact. moves all 4 extremities w/o difficulty. Affect pleasant   Telemetry   NSR   EKG      NSR 78 bpm normal axis. Diffuse ST-T abnormalities. No change Personally reviewed    Labs   Basic Metabolic Panel: Recent Labs  Lab 02/08/18 1607 02/11/18 1131 02/12/18 0227 02/13/18 0442 02/14/18 0201  NA 139 136 137 137 136  K 3.5 3.1* 4.0 4.9 4.2  CL 105 99* 102 105 101  CO2 22 24 21* 20* 22  GLUCOSE 89 98 93 95 94  BUN _0 21* 20  CREATININE 0.80 1.08* 1.12* 1.14* 0.95  CALCIUM 9.5 9.2 9.4 9.3 9.0  MG  --  1.6* 2.1 2.0  --     Liver Function Tests: Recent Labs  Lab 02/11/18 1131  AST 28  ALT 20  ALKPHOS 141*  BILITOT 1.1  PROT 8.1  ALBUMIN 3.9   No results for input(s): LIPASE, AMYLASE in the last 168 hours. No results for input(s): AMMONIA in the last 168 hours.  CBC: Recent Labs  Lab 02/08/18 1607 02/11/18 1131 02/12/18 0227  WBC 5.8 6.6 6.0  NEUTROABS  --  4.0  --   HGB 13.0 13.8 14.3  HCT 42.3 43.5 45.1  MCV 86.7 86.8 87.2  PLT 224 235 230    Cardiac Enzymes: No results for input(s): CKTOTAL, CKMB, CKMBINDEX, TROPONINI in the last 168 hours.  BNP: BNP (last 3 results) Recent Labs    09/16/17 0800 02/08/18 1607 02/11/18 1131  BNP 668.9* 1,043.9* 575.1*    ProBNP (last 3 results) Recent Labs    08/06/17 1227  PROBNP 2,160.0*     CBG: Recent Labs  Lab 02/12/18 0630 02/13/18 0659 02/14/18 0638  GLUCAP 91 107* 97    Coagulation Studies: No results for input(s): LABPROT, INR in the last 72 hours.   Imaging    No results found.   Medications:     Current Medications: . apixaban  5 mg Oral BID  . clopidogrel  75 mg Oral Daily  . fluticasone  2 spray Each Nare Daily  . ipratropium-albuterol  3 mL Nebulization Q4H  . mouth rinse  15 mL Mouth Rinse BID  . metoprolol succinate  50 mg Oral Daily  . pantoprazole  40 mg Oral Daily  . roflumilast  500 mcg Oral Daily  . rosuvastatin  20 mg Oral QHS  . torsemide  60 mg Oral Daily  . traZODone  50 mg Oral QHS     Infusions:     Patient Profile   Elizabeth  Manon is a 62 year old with a history of CAD CABG 7/2017w/ LIMA-LAD, SVG-OM-CFX, SVG-RCA, COPD, HTN, hyperlipidemia, fibromyalgia, GERD, PAF formerly on amio now on eliquis, remote cocaine use, and lupus.   Assessment/Plan   1. A/C Combined Systolic/Diastolic Heart Failure - on ECHO RV worsening from previous 09/2017  Volume status improved with IV lasix. Appears euvolemic.  2. PAH  -COPD, on oxygen. - Likely primarily WHO group 3 with component of Group 2.  -ANA positive (H/o Lupus) - RF negative.  - Will need RHC tomorrow to reassess pulmonary pressures.   3. Acute on chronic hypoxemic respiratory failure -multifactorial NYHA IIIB  4. CAD:  Hx of CABG X4 05/2016. On BB, ARB and statin. Not on aspirin due to need for anticoagulation. Troponin negative X3. Eliquis was held. - Cath 09/20/17 with potential culprit lesions are the graft lesion and SVG-OM-dCx as well as the anastomotic lesion and OM 3 with DES.  No s/s ischemia.  On plavix and statin.   5. PAF Maintaining NSR. Hold eliquis for RHC in am. .   6. Obesity Body mass index is 39.4 kg/m.   7. DNR   Medication concerns reviewed with patient and pharmacy team. Barriers identified: noncompliance.   Length of Stay: 3  Amy Clegg, NP  02/14/2018, 5:19 PM  Advanced Heart Failure Team Pager [phone] (M-F; 7a - 4p)  Please contact [facility] for night-coverage after hours (4p -7a ) and weekends on [email or web address]  Patient seen and examined with the above-signed Advanced Practice Provider and/or Housestaff. I personally reviewed laboratory data, imaging studies and relevant notes. I independently examined the patient and formulated the important aspects of the plan. I have edited the note to reflect any of my changes or salient points. I have personally discussed the plan with the patient and/or family.  Echo, chest CT and cath reviewed personally.   Cath data from 11/18 shows moderate PAH which was felt to be  multifactorial. Now with progressive NYHA IIIb symptoms. Echo shows worsening RV function. Will plan RHC in am with possible cautious trial of selective pulmonary vasodilators. Will need repeat PFTs and VQ scan. D/w Dr. Hedy Camara, MD  6:12 PM

## 2018-02-14 NOTE — Progress Notes (Signed)
PT Cancellation Note  Patient Details Name: Elizabeth [name] MRN: [id number] DOB: Sep 25, 1956   Cancelled Treatment:    Reason Eval/Treat Not Completed: Patient at procedure or test/unavailable Noted imminent DC status and attempted to evaluate patient multiple times, on all occasions she was occupied with other providers. Plan to attempt to try back later in morning as/if schedule allows.    Deniece Ree PT, DPT, CBIS  Supplemental Physical Therapist [facility]   Pager [phone]

## 2018-02-14 NOTE — Progress Notes (Signed)
Internal Medicine Attending:   I saw and examined the patient. I reviewed the resident's note and I agree with the resident's findings and plan as documented in the resident's note.  62 year old woman admitted from the pulmonology clinic on Friday because of worsening oxygen requirement and dyspnea with minimal exertion.  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  On exam this morning she appears anxious, she is frustrated that she is still feeling so short of breath with even minimal exertion.  She is saturating well on 3 L via nasal cannula.  She has home oxygen already, with multiple portable tanks and a tank filler at home, she uses oxygen between 3-6 L as needed depending on her level of activity.  On exam she appears to be euvolemic, no JVD, no lower extremity swelling, weight is stable.  She was diuresed over the weekend and is -1 L, down 2 pounds since admission. CT chest yesterday is stable, no pulm edema. We are awaiting pulmonology consultation to see if there is any further inpatient work up or intervention. I am suspicious this is progressive pulmonary disease and will not be easily reversible.

## 2018-02-14 NOTE — Evaluation (Signed)
Physical Therapy Evaluation Patient Details Name: Elizabeth [name] MRN: [id number] DOB: [birth year] Today's Date: 02/14/2018   History of Present Illness  61yo female with history of admission in December 2018 due to acute hypoxic respiratory failure due to COPD exacerbation. She was discharged and then sent to the ED from cardiac rehab on 02/08/18 due to increased dyspnea and hypoxia but left without being seen. She is now presenting to hte ED with ongoing increasing symptoms and intensity of DOE. PMH CAD, CHF, COPD, depression, gout, lupus, on home O2, pulmonary nodules, CVA   Clinical Impression   Patient received in bed, pleasant and willing to participate with PT services, reports she is limited in gait distance at baseline and is on home O2 at baseline, very supportive family. She is able to complete functional bed mobility with Mod(I) but does require Min guard for all other mobility including functional transfers and gait with RW due to R flank pain. Noted significant muscle spasm and guarding R flank today possibly contributing to pain. Able to ambulate in room with RW and min guard/cues for safety, also able to perform self care and toileting/bathing based tasks with S in general but did require VC as she tends to gasp through her mouth due to pain causing SpO2 drop to 83% on 2.5LPM O2. Able to quickly recover to 90s with correct breathing strategy. She was left up in the chair with all needs met this morning.     Follow Up Recommendations Home health PT    Equipment Recommendations  Other (comment)(shower bench )    Recommendations for Other Services       Precautions / Restrictions Precautions Precautions: Other (comment) Precaution Comments: watch SpO2  Restrictions Weight Bearing Restrictions: No      Mobility  Bed Mobility Overal bed mobility: Modified Independent                Transfers Overall transfer level: Needs assistance Equipment used: Rolling walker  (2 wheeled) Transfers: Sit to/from Stand Sit to Stand: Min guard         General transfer comment: Min guard for safety, VC and TC for safety   Ambulation/Gait Ambulation/Gait assistance: Min guard Ambulation Distance (Feet): 35 Feet(6f, 555f 1587fn room ) Assistive device: Rolling walker (2 wheeled) Gait Pattern/deviations: Step-through pattern;Decreased step length - right;Decreased step length - left;Decreased stride length;Decreased weight shift to right;Antalgic;Trunk flexed     General Gait Details: very antalgic pattern due to R flank pain, requires seated rest breaks due to DOE and pain; often gasps during activities due to pain with SpO2 falling to 80s and patient requiring cues for pursed lip breathing   Stairs            Wheelchair Mobility    Modified Rankin (Stroke Patients Only)       Balance Overall balance assessment: Mild deficits observed, not formally tested                                           Pertinent Vitals/Pain Pain Assessment: 0-10 Pain Score: 8  Pain Location: R flank with movement  Pain Descriptors / Indicators: Guarding;Grimacing;Sore Pain Intervention(s): Limited activity within patient's tolerance;Monitored during session;Repositioned;Relaxation    Home Living Family/patient expects to be discharged to:: Private residence Living Arrangements: Other relatives Available Help at Discharge: Family;Available 24 hours/day Type of Home: House Home Access: Stairs to enter  Entrance Stairs-Rails: None Entrance Stairs-Number of Steps: 1 Home Layout: Two level;Able to live on main level with bedroom/bathroom Home Equipment: Shower seat;Grab bars - tub/shower;Walker - 2 wheels;Cane - single point;Walker - 4 wheels Additional Comments: reports she mostly stays in her room, walks only short distances at baseline     Prior Function Level of Independence: Independent         Comments: on 2L of oxygen at baseline       Hand Dominance   Dominant Hand: Right    Extremity/Trunk Assessment        Lower Extremity Assessment Lower Extremity Assessment: Generalized weakness    Cervical / Trunk Assessment Cervical / Trunk Assessment: Kyphotic  Communication   Communication: No difficulties  Cognition Arousal/Alertness: Awake/alert Behavior During Therapy: WFL for tasks assessed/performed Overall Cognitive Status: Within Functional Limits for tasks assessed                                        General Comments      Exercises     Assessment/Plan    PT Assessment Patient needs continued PT services  PT Problem List Decreased strength;Decreased mobility;Decreased safety awareness;Decreased coordination;Obesity;Decreased activity tolerance;Cardiopulmonary status limiting activity;Pain;Decreased balance       PT Treatment Interventions DME instruction;Therapeutic activities;Gait training;Therapeutic exercise;Patient/family education;Stair training;Balance training;Functional mobility training;Neuromuscular re-education    PT Goals (Current goals can be found in the Care Plan section)  Acute Rehab PT Goals Patient Stated Goal: to reduce pain  PT Goal Formulation: With patient Time For Goal Achievement: 02/28/18 Potential to Achieve Goals: Good    Frequency Min 3X/week   Barriers to discharge        Co-evaluation               AM-PAC PT "6 Clicks" Daily Activity  Outcome Measure Difficulty turning over in bed (including adjusting bedclothes, sheets and blankets)?: None Difficulty moving from lying on back to sitting on the side of the bed? : None Difficulty sitting down on and standing up from a chair with arms (e.g., wheelchair, bedside commode, etc,.)?: None Help needed moving to and from a bed to chair (including a wheelchair)?: A Little Help needed walking in hospital room?: A Little Help needed climbing 3-5 steps with a railing? : A Lot 6 Click Score:  20    End of Session Equipment Utilized During Treatment: Oxygen Activity Tolerance: Patient limited by pain Patient left: in chair;with call bell/phone within reach   PT Visit Diagnosis: Muscle weakness (generalized) (M62.81);Difficulty in walking, not elsewhere classified (R26.2);Pain Pain - Right/Left: Right Pain - part of body: (flank )    Time: 0459-9774 PT Time Calculation (min) (ACUTE ONLY): 57 min   Charges:   PT Evaluation $PT Eval Moderate Complexity: 1 Mod PT Treatments $Gait Training: 8-22 mins $Therapeutic Activity: 8-22 mins $Self Care/Home Management: 8-22   PT G Codes:        Deniece Ree PT, DPT, CBIS  Supplemental Physical Therapist [place]   Pager [phone]

## 2018-02-14 NOTE — Progress Notes (Signed)
Subjective: Ms. Blasko was seen resting in bed this morning. She states that her shortness of breath remains at baseline today. Her baseline respiratory status is poor and she requires 2-3L of home O2. She does currently have O2 tanks at home as well as a tank filler. She is working on obtaining a Education officer, museum. She is awaiting her pulmonology consult. Her right flank pain is improved today. She states that tylenol is helping and she was able to get up and walk to the bathroom and back. She became distressed when discussing the chronicity of her shortness of breath. She has no other complaints or questions at this time.  Objective:  Vital signs in last 24 hours: Vitals:   02/14/18 0858 02/14/18 1051 02/14/18 1154 02/14/18 1217  BP:    114/67  Pulse:    78  Resp:      Temp:      TempSrc:      SpO2: 94% 92% 93% 94%  Weight:      Height:       General: Well appearing, no acute distress Cardiac: regular rate and rhythm, intact distal pulses  Pulmonary: Continues to have pursed lip breathing and accessory muscle use at times, able to speak in full sentences. Lungs clear to auscultations.  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 She was at pulmonology office visit on 3/29 with shortness of breath and desated to 88% with ambulation, there was concern for volume overload so she was advised to present to the ED.      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 Pulmonary hypertension Dyspnea - Echo 3/30 with EF 45%, G1DD with mild left atrial dilation, severe right ventricular dilation with  increased wall thickness, septal flattening, PA pressure 47 mmHg, IVC normal in size; recommended evaluation for cardiac amyloidosis - ANA and Anti-Centromere Ab's positive in the past, But no calcinosis, raynauds, esophogeal dysmotility, sclerodactyly, or telengiectasias  - Pulmonology consulted, Appreciate their recommendations - Dyspnea persistent, but is at baseline and on 2.5L O2 - Continue supplemental O2 for goal SpO2 88%  - CT chest showed stable nodules (since 2009), Diffuse moderate centrilobular emphysema - ABG showed pO2 elevated at 127 with all other values normal - PFTs ordered   - Duonebs ordered   - On home metoprolol 27m Daily and torsemide 620mDaily  Right side pain  Pain is improved somewhat following trigger point injection on 02/13/18. Pain currently tolerable with tylenol only for pain control. (Morphine was ineffective and was discontiuned, Toradol helped but was discontinued after AKI). - tylenol 100064m6h PRN   COPD, group D, by GOLD 2017 [facility] - Pulmonology consulted, Appreciate their recommendations - Duoneb PRN for SOB or wheezing - Continue Trelegy - Supplemental O2  Dispo: Anticipated discharge in approximately 0-1 day(s).   MelNeva SeatD 02/14/2018, 2:26 PM Pager: [phone]

## 2018-02-14 NOTE — Consult Note (Signed)
   [facility] CM Inpatient Consult   02/14/2018  Elizabeth [name] 1956/10/11 [id number]  Patient was assessed for [facility] for community services. Patient was previously active with [place] [facility].  Came by to speak with patient at bedside regarding being restarted with [facility] services and she was in treatment with the respiratory therapist.  Will follow up.   Of note, [facility] Care Management services does not replace or interfere with any services that are arranged by inpatient case management or social work. For additional questions or referrals please contact:  Natividad Brood, RN BSN [facility] Liaison  [phone] business mobile phone Toll free office [phone]

## 2018-02-14 NOTE — Discharge Summary (Signed)
Name: Elizabeth [name] MRN: [id number] DOB: 1956-11-04 62 y.o. PCP: Ledell Noss, MD  Date of Admission: 02/11/2018 11:22 AM Date of Discharge: 02/14/2018 Attending Physician: Axel Filler, MD  Discharge Diagnosis:  Active Problems:   COPD, group D, by GOLD 2017 classification ([place])   Acute on chronic respiratory failure with hypoxia ([place])   Acute on chronic combined systolic and diastolic CHF (congestive heart failure) ([place])   Pulmonary hypertension (HCC)   Dyspnea   Anticentromere antibodies present   Right flank pain  Discharge Medications: Allergies as of 02/16/2018   No Known Allergies     Medication List    TAKE these medications   acetaminophen 500 MG tablet Commonly known as:  TYLENOL Take 500 mg by mouth every 6 (six) hours as needed for mild pain.   albuterol 108 (90 Base) MCG/ACT inhaler Commonly known as:  PROVENTIL HFA;VENTOLIN HFA Inhale 2 puffs into the lungs every 6 (six) hours as needed for wheezing or shortness of breath.   albuterol (2.5 MG/3ML) 0.083% nebulizer solution Commonly known as:  PROVENTIL Take 3 mLs (2.5 mg total) by nebulization every 6 (six) hours as needed for wheezing or shortness of breath.   apixaban 5 MG Tabs tablet Commonly known as:  ELIQUIS Take 1 tablet (5 mg total) by mouth 2 (two) times daily.   clopidogrel 75 MG tablet Commonly known as:  PLAVIX Take 1 tablet (75 mg total) daily by mouth.   colchicine 0.6 MG tablet Take 1 tablet (0.6 mg total) by mouth as needed (once daily when you are having a flair of the gout). What changed:  when to take this   fluticasone 50 MCG/ACT nasal spray Commonly known as:  FLONASE SHAKE LIQUID AND USE 2 SPRAYS IN EACH NOSTRIL DAILY What changed:  See the new instructions.   Fluticasone-Umeclidin-Vilant 100-62.5-25 MCG/INH Aepb Commonly known as:  TRELEGY ELLIPTA Inhale 1 puff into the lungs daily.   ipratropium-albuterol 0.5-2.5 (3) MG/3ML Soln Commonly known as:   DUONEB Take 3 mLs by nebulization every 6 (six) hours as needed. What changed:  reasons to take this   metoprolol succinate 50 MG 24 hr tablet Commonly known as:  TOPROL-XL Take 1 tablet (50 mg total) by mouth daily.   nitroGLYCERIN 0.4 MG SL tablet Commonly known as:  NITROSTAT Place 1 tablet (0.4 mg total) under the tongue every 5 (five) minutes x 3 doses as needed for chest pain.   oxyCODONE-acetaminophen 5-325 MG tablet Commonly known as:  PERCOCET Take 1 tablet by mouth every 6 (six) hours as needed for up to 7 days for severe pain.   OXYGEN Inhale 2-3 L into the lungs continuous.   pantoprazole 40 MG tablet Commonly known as:  PROTONIX TAKE 1 TABLET BY MOUTH DAILY What changed:    how much to take  how to take this  when to take this   potassium chloride SA 20 MEQ tablet Commonly known as:  K-DUR,KLOR-CON Take 1 tablet (20 mEq total) by mouth daily.   roflumilast 500 MCG Tabs tablet Commonly known as:  DALIRESP Take 1 tablet (500 mcg total) by mouth daily.   rosuvastatin 20 MG tablet Commonly known as:  CRESTOR Take 1 tablet (20 mg total) by mouth at bedtime.   sildenafil 20 MG tablet Commonly known as:  REVATIO Take 1 tablet (20 mg total) by mouth 3 (three) times daily.   torsemide 20 MG tablet Commonly known as:  DEMADEX Take 3 tablets (60 mg total) by mouth  daily. OK to take an extra tablet daily as needed for weight gain or edema. What changed:    how much to take  when to take this  additional instructions   traZODone 50 MG tablet Commonly known as:  DESYREL Take 1 tablet (50 mg total) by mouth at bedtime.       Disposition and follow-up:   Elizabeth [name] was discharged from [facility] in Stable condition.  At the hospital follow up visit please address:  Dyspnea on Exertion - Evaluate for stability of symptoms, Improved to baseline before discharge (2-4L home O2)  - Ensure follow up with pulmonology, she sees  Dr. Vaughan Browner, but has requested new provider (referral place for new provider in the same practice) - Ensure able to obtain and taking Sildenafil 17m TID for Pulmonary HTN  Right sided Flank Pain: - Adequate controlled achieved with percocet - CT negative for renal stone or other cause - Susupected musculoskeletal pain; expected to improve over time in the home setting - Evaluate for symptom improvement/resolution  2.  Labs / imaging needed at time of follow-up: None  3.  Pending labs/ test needing follow-up: Multiple Myeloma Panel (For AL Amyloid), Auto-Immune Panel  Follow-up Appointments: Follow-up Information    MJuanito Doom MD Follow up on 03/30/2018.   Specialty:  Pulmonary Disease Why:  Your appointment is at 2:30 PM  Contact information: [place] [phone]        Bensimhon, DShaune Pascal MD Follow up.   Specialty:  Cardiology Contact information: [place] [id number] 02[place].   Why:  you have an appointment scheduled for 9:15 am, please call and let uKoreaknow if you need to change this appointment  Contact information: [address] HospitalCourse by problem list:  Dyspnea on Exertion, A/C Hypoxic Respiratory Failure, A/C combined biventricular CHF, Pulmonary HTN: Patient with chronic dyspnea and hypoxia presented from pulmonologist's office with worsening dyspnea and concern for CHF exacerbation in the setting of reported weight gain and lower extremity edema; with low suspicion of COPD being a factor. Her Initial labs were relatively unremarkable and her CXR was without significant pulmonary edema. Did not appear hypervolemic and had minimal response to IV lasix. She was put on home Torsemide.  Pulmonary Hypertension confirmed on Echo and RHC. Pulmonary HTN thought to be  from combination of Group 1 (primary arterial pulmonary hypertension from lupus,?  Crest syndrome), Group 2 (coronary artery disease, diastolic dysfunction) and Group 3 (COPD, mild OSA). ANA pending (obtained to evaluate for autoimmune etiology of pulmonary HTN as patient has history of positive ANA and Anti-Centromere and ?Lupus). She was started on a trial of sildenafil.   Patient's Respiratory Status returned to her baseline. Echo 3/30 showed EF 45%, evaluation for cardiac amyloidosis recommended due to biventricular wall thickening. 927m-PYP Nuclear study negative for sign of ATTR Amyloidosis. MM Panel for AL Amyloidosis pending at discharge.  Patient discharged on Metoprolol 5066maily, Torsemide 57m11mily, and Sildenafil 20mg23m  Right sided Flank Pain Patient complained of Right Flank pain while admitted. CT renal negative for renal stone or hydronephrosis, bladder unremarkable. UA WNL.  Pain worsened by movement and was tender to palpations. Suspected to be musculoskeletal in nature. Multiple medications were tried and  patient obtained adequate pain control with percocet. Patient discharge with 1 week of percocet with expectation of improvement of pain back in home setting.  Discharge Vitals:   BP (!) 94/58 (BP Location: Left Arm)   Pulse 86   Temp 97.8 F (36.6 C) (Oral)   Resp 18   Ht _0  (1.575 m)   Wt 212 lb 8 oz (96.4 kg)   LMP 12/30/1994   SpO2 93%   BMI 38.87 kg/m   Pertinent Labs, Studies, and Procedures:  CBC Latest Ref Rng & Units 02/12/2018 02/11/2018 02/08/2018  WBC 4.0 - 10.5 K/uL 6.0 6.6 5.8  Hemoglobin 12.0 - 15.0 g/dL 14.3 13.8 13.0  Hematocrit 36.0 - 46.0 % 45.1 43.5 42.3  Platelets 150 - 400 K/uL 230 235 224   BMP Latest Ref Rng & Units 02/16/2018 02/14/2018 02/13/2018  Glucose 65 - 99 mg/dL 98 94 95  BUN 6 - 20 mg/dL 16 20 21(H)  Creatinine 0.44 - 1.00 mg/dL 0.94 0.95 1.14(H)  BUN/Creat Ratio 9 - 23 - - -  Sodium 135 - 145 mmol/L 137 136 137  Potassium 3.5 -  5.1 mmol/L 3.8 4.2 4.9  Chloride 101 - 111 mmol/L 101 101 105  CO2 22 - 32 mmol/L 22 22 20(L)  Calcium 8.9 - 10.3 mg/dL 9.6 9.0 9.3   EKG on admission  EKG Interpretation  Date/Time:  Friday February 11 2018 11:33:31 EDT Ventricular Rate:  78 PR Interval:    QRS Duration: 99 QT Interval:  457 QTC Calculation: 521 R Axis:   67 Text Interpretation:  Sinus rhythm Abnormal R-wave progression, late transition Repol abnrm suggests ischemia, anterolateral Prolonged QT interval No significant change since last tracing Confirmed by Isla Pence [phone]) on 02/11/2018 11:59:38 AM      Lower Extremity Doppler: Final Interpretation: Right: No evidence of common femoral vein obstruction. Left: There is no evidence of deep vein thrombosis in the lower extremity. However, portions of this examination were limited- see technologist comments above. No cystic structure found in the popliteal fossa.  Echocardiogram: Study Conclusions - Left ventricle: The cavity size was normal. There was moderate concentric hypertrophy. Systolic function was mildly reduced. The estimated ejection fraction was 45%. Doppler parameters are   consistent with abnormal left ventricular relaxation (grade 1 diastolic dysfunction). - Ventricular septum: Septal motion showed abnormal function and dyssynergy. The contour showed mild diastolic flattening and systolic flattening. - Left atrium: The atrium was mildly dilated. - Right ventricle: The cavity size was severely dilated. Wall thickness was increased. Systolic function was moderately reduced. - Right atrium: The atrium was mildly dilated. - Pulmonary arteries: PA peak pressure: 47 mm Hg (S). - Pericardium, extracardiac: There was a left pleural effusion. - Impressions: Both the LV and RV are thick. LVEF 45%. RV is markedly dilated and hypokinetic with septal flattening suggesting elevated R-sided pressures. With constellation of   findings would consider evalaution for  possible cardiac amyloidosis. With previous h/o CABG cannot exclude a component of pericardial constriction though lack of IVC dilation makes this unlikely.  Right Heart Cath: Findings: RA = 10 RV = 76/10 PA = 71/33 (45) PCW = 5 Fick cardiac output/index = 5.0/2.5 Thermo CO/CI = 5.6/2.8 PVR = 7.2 WU FA sat = 90% PA sat = 59%, 66% Assessment: 1. Moderate pulmonary HTN with normal wedge pressure after diuresis 2. Markedly elevated PVR Plan/Discussion: Will start sildenafil 20 TID. Watch oxygenation closely.   CXR (2 View) (3/29): IMPRESSION: No active cardiopulmonary disease.  CT  Renal Stone: IMPRESSION: 1. No cause for right flank pain identified. 2. Atherosclerotic changes in the nonaneurysmal aorta, iliac vessels, and femoral vessels. 3. No other acute abnormalities.  CT Chest WO Contrast: IMPRESSION: 1. Stable appearing bilateral pulmonary nodules the largest are in the right middle lobe measuring up to 6.6 mm dating back to 2009 consistent with benign findings. 2. Stable 12 mm left thyroid nodule. 3. Diffuse moderate centrilobular emphysema without acute pneumonic consolidation or CHF. 4. Status post CABG. 5. Ectatic, atherosclerotic ascending thoracic aorta to 3.9 cm.  NM 41mTc-PYP: IMPRESSION: - Visual and quantitative assessment (grade 1 to 2, H/CLL equal 1.25 are indeterminate for transthyretin amyloidosis. - Favor non-suggestive of amyloidosis.  CXR (2 view) (4/3): IMPRESSION: Previous CABG.  Pulmonary scarring.  No active disease evident.  Discharge Instructions: Discharge Instructions    (HEART FAILURE PATIENTS) Call MD:  Anytime you have any of the following symptoms: 1) 3 pound weight gain in 24 hours or 5 pounds in 1 week 2) shortness of breath, with or without a dry hacking cough 3) swelling in the hands, feet or stomach 4) if you have to sleep on extra pillows at night in order to breathe.   Complete by:  As directed    Ambulatory referral to Pulmonology    Complete by:  As directed    [place] okay, patient is requesting to see a different provider.   Call MD for:  difficulty breathing, headache or visual disturbances   Complete by:  As directed    Call MD for:  persistant dizziness or light-headedness   Complete by:  As directed    Call MD for:  persistant nausea and vomiting   Complete by:  As directed    Diet - low sodium heart healthy   Complete by:  As directed    Discharge instructions   Complete by:  As directed    Thank you for allowing uKoreato care for you  Your worsening shortness of breath is believed to be due to high blood pressure in your lung arteries - You have been started on sildenafil to help reduce these pressures  For your pain - We have ordered a week supply of Percocet for you, your pain will be reevaluated at your follow up visit. - You can continue to take over there counter pain relievers for mild pain  Please follow up in clinic on 02/23/18 at 9:15 am  We have ordered a referral for a new pulmonologist for you.   Increase activity slowly   Complete by:  As directed       Signed: MNeva Seat MD 02/18/2018, 1:44 PM   Pager: [phone]

## 2018-02-14 NOTE — Consult Note (Addendum)
PULMONARY / CRITICAL CARE MEDICINE   Name: Elizabeth [name] MRN: [id number] DOB: 27-Sep-1956    ADMISSION DATE:  02/11/2018 CONSULTATION DATE:    Loistine Chance MD:  Axel Filler MD  CHIEF COMPLAINT:  Dyspnea  HISTORY OF PRESENT ILLNESS:   62 year old with past medical history of lupus, heart failure, coronary artery disease s/p CABG in July 2017 paroxysmal A. fib, COPD, hypertension, pulmonary HTN.  Seen in pulmonary clinic on 3/29 with hypoxia, dyspnea and sent to the ED. In the hospital she has been diuresed with increase in creatinine however she continues to be dyspneic and hypoxic.  PCCM consulted for help with management.  PAST MEDICAL HISTORY :  She  has a past medical history of CAD (coronary artery disease), CHF (congestive heart failure) ([place]) (05/31/2017), COPD (chronic obstructive pulmonary disease) ([place]) (11/17/2016), Depression, Fibroids, GERD (gastroesophageal reflux disease), Gout, HLD (hyperlipidemia), Hypertension, Lupus (2009), On home oxygen therapy, Pulmonary nodules (05/2008), Stroke ([place]) (04/2014), and Toe pain, right (10/08/2017).  PAST SURGICAL HISTORY: She  has a past surgical history that includes Colonoscopy (N/A, 07/14/2013); ORIF tibia plateau (Left, 08/22/2013); TEE without cardioversion (N/A, 06/08/2016); Fracture surgery; Coronary artery bypass graft (N/A, 06/08/2016); Vaginal hysterectomy (05/2003); Tubal ligation; Coronary angioplasty with stent; Cardiac catheterization (N/A, 06/02/2016); RIGHT/LEFT HEART CATH AND CORONARY/GRAFT ANGIOGRAPHY (N/A, 09/20/2017); and CORONARY STENT INTERVENTION (N/A, 09/22/2017).  No Known Allergies  No current facility-administered medications on file prior to encounter.    Current Outpatient Medications on File Prior to Encounter  Medication Sig  . acetaminophen (TYLENOL) 500 MG tablet Take 500 mg by mouth every 6 (six) hours as needed for mild pain.  Marland Kitchen albuterol (PROVENTIL HFA;VENTOLIN HFA) 108 (90 Base) MCG/ACT inhaler  Inhale 2 puffs into the lungs every 6 (six) hours as needed for wheezing or shortness of breath.  Marland Kitchen apixaban (ELIQUIS) 5 MG TABS tablet Take 1 tablet (5 mg total) by mouth 2 (two) times daily.  . clopidogrel (PLAVIX) 75 MG tablet Take 1 tablet (75 mg total) daily by mouth.  . colchicine 0.6 MG tablet Take 1 tablet (0.6 mg total) by mouth as needed (once daily when you are having a flair of the gout). (Patient taking differently: Take 0.6 mg by mouth daily as needed (once daily when you are having a flair of the gout). )  . fluticasone (FLONASE) 50 MCG/ACT nasal spray SHAKE LIQUID AND USE 2 SPRAYS IN EACH NOSTRIL DAILY (Patient taking differently: SHAKE LIQUID AND USE 2 SPRAYS IN EACH NOSTRIL DAILY AS NEEDED FOR CONGESTION)  . Fluticasone-Umeclidin-Vilant (TRELEGY ELLIPTA) 100-62.5-25 MCG/INH AEPB Inhale 1 puff into the lungs daily.  Marland Kitchen ipratropium-albuterol (DUONEB) 0.5-2.5 (3) MG/3ML SOLN Take 3 mLs by nebulization every 6 (six) hours as needed. (Patient taking differently: Take 3 mLs by nebulization every 6 (six) hours as needed (shortness of breath/wheezing). )  . metoprolol succinate (TOPROL-XL) 50 MG 24 hr tablet Take 1 tablet (50 mg total) by mouth daily.  . nitroGLYCERIN (NITROSTAT) 0.4 MG SL tablet Place 1 tablet (0.4 mg total) under the tongue every 5 (five) minutes x 3 doses as needed for chest pain.  . OXYGEN Inhale 2-3 L into the lungs continuous.  . pantoprazole (PROTONIX) 40 MG tablet TAKE 1 TABLET BY MOUTH DAILY (Patient taking differently: TAKE 1 TABLET (40 MG) BY MOUTH DAILY)  . potassium chloride SA (K-DUR,KLOR-CON) 20 MEQ tablet Take 1 tablet (20 mEq total) by mouth daily.  . roflumilast (DALIRESP) 500 MCG TABS tablet Take 1 tablet (500 mcg total) by mouth daily.  Marland Kitchen  torsemide (DEMADEX) 20 MG tablet Take 3 tablets (60 mg total) by mouth daily. OK to take an extra tablet daily as needed for weight gain or edema. (Patient taking differently: Take 20-60 mg by mouth See admin instructions.  Take 3 tablets (60 mg) by mouth every morning and 1 tablet (20 mg) at night)  . traZODone (DESYREL) 50 MG tablet Take 1 tablet (50 mg total) by mouth at bedtime.  Marland Kitchen albuterol (PROVENTIL) (2.5 MG/3ML) 0.083% nebulizer solution Take 3 mLs (2.5 mg total) by nebulization every 6 (six) hours as needed for wheezing or shortness of breath. (Patient not taking: Reported on 02/11/2018)  . rosuvastatin (CRESTOR) 20 MG tablet Take 1 tablet (20 mg total) by mouth at bedtime. (Patient not taking: Reported on 02/11/2018)    FAMILY HISTORY:  Her indicated that her mother is deceased. She indicated that her sister is deceased. She indicated that her brother is deceased. She indicated that the status of her neg hx is unknown.   SOCIAL HISTORY: She  reports that she quit smoking about 14 months ago. Her smoking use included cigarettes. She has a 5.40 pack-year smoking history. She has never used smokeless tobacco. She reports that she drinks about 8.4 oz of alcohol per week. She reports that she does not use drugs.  REVIEW OF SYSTEMS:     SUBJECTIVE:    VITAL SIGNS: BP 114/67 (BP Location: Left Arm)   Pulse 78   Temp 98 F (36.7 C) (Oral)   Resp 17   Ht _0  (1.575 m)   Wt 215 lb 6.4 oz (97.7 kg) Comment: a scale  LMP 12/30/1994   SpO2 95%   BMI 39.40 kg/m   HEMODYNAMICS:    VENTILATOR SETTINGS:    INTAKE / OUTPUT: I/O last 3 completed shifts: In: 1380 [P.O.:1380] Out: 2150 [Urine:2150]  PHYSICAL EXAMINATION: Blood pressure 114/67, pulse 78, temperature 98 F (36.7 C), temperature source Oral, resp. rate 17, height _1  (1.575 m), weight 215 lb 6.4 oz (97.7 kg), last menstrual period 12/30/1994, SpO2 95 %. Gen:      No acute distress HEENT:  EOMI, sclera anicteric Neck:     No masses; no thyromegaly Lungs:    Clear to auscultation bilaterally; normal respiratory effort CV:         Regular rate and rhythm; no murmurs Abd:      + bowel sounds; soft, non-tender; no palpable masses, no  distension Ext:    No edema; adequate peripheral perfusion Skin:      Warm and dry; no rash Neuro: alert and oriented x 3 Psych: normal mood and affect  LABS:  BMET Recent Labs  Lab 02/12/18 0227 02/13/18 0442 02/14/18 0201  NA 137 137 136  K 4.0 4.9 4.2  CL 102 105 101  CO2 21* 20* 22  BUN 16 21* 20  CREATININE 1.12* 1.14* 0.95  GLUCOSE 93 95 94    Electrolytes Recent Labs  Lab 02/11/18 1131 02/12/18 0227 02/13/18 0442 02/14/18 0201  CALCIUM 9.2 9.4 9.3 9.0  MG 1.6* 2.1 2.0  --     CBC Recent Labs  Lab 02/08/18 1607 02/11/18 1131 02/12/18 0227  WBC 5.8 6.6 6.0  HGB 13.0 13.8 14.3  HCT 42.3 43.5 45.1  PLT 224 235 230    Coag's No results for input(s): APTT, INR in the last 168 hours.  Sepsis Markers No results for input(s): LATICACIDVEN, PROCALCITON, O2SATVEN in the last 168 hours.  ABG Recent Labs  Lab 02/13/18 1559  PHART 7.432  PCO2ART 33.6  PO2ART 127*    Liver Enzymes Recent Labs  Lab 02/11/18 1131  AST 28  ALT 20  ALKPHOS 141*  BILITOT 1.1  ALBUMIN 3.9    Cardiac Enzymes No results for input(s): TROPONINI, PROBNP in the last 168 hours.  Glucose Recent Labs  Lab 02/12/18 0630 02/13/18 0659 02/14/18 0638  GLUCAP 91 107* 97    Imaging Ct Chest Wo Contrast  Result Date: 02/13/2018 CLINICAL DATA:  Pulmonary hypertension and emphysema.  Dyspnea. EXAM: CT CHEST WITHOUT CONTRAST TECHNIQUE: Multidetector CT imaging of the chest was performed following the standard protocol without IV contrast. COMPARISON:  02/11/2018 CXR, chest CT 09/16/2017, 11/24/2011 and priors to 2009 FINDINGS: Cardiovascular: Patient is status post CABG. Heart size is top-normal without pericardial effusion. Native left main and three-vessel coronary arteriosclerosis. Dilatation of the main pulmonary artery 4.1 cm compatible with changes chronic pulmonary hypertension, similar in appearance to prior. Atherosclerotic nonaneurysmal thoracic aorta with ectasia of  the ascending thoracic aorta to 3.9 cm. Mediastinum/Nodes: Hypodense 12 mm left thyroid nodule, less well visualized on this unenhanced study. No significant progression in size. Midline trachea. Unremarkable thoracic esophagus. Stable 13 mm short axis precarinal lymph node. No definite hilar hilar adenopathy given limitations of this noncontrast exam. Lungs/Pleura: Centrilobular emphysema noted bilaterally without acute pneumonic consolidation, effusion or pneumothorax. Redemonstration of bilateral pulmonary nodules, the largest are in the right middle lobe ranging in size from 5 mm through 6.6 mm in on the left, within the lower lobe measuring 5 mm. Upper Abdomen: No acute abnormality. Musculoskeletal: No chest wall mass or suspicious bone lesions identified. IMPRESSION: 1. Stable appearing bilateral pulmonary nodules the largest are in the right middle lobe measuring up to 6.6 mm dating back to 2009 consistent with benign findings. 2. Stable 12 mm left thyroid nodule. 3. Diffuse moderate centrilobular emphysema without acute pneumonic consolidation or CHF. 4. Status post CABG. 5. Ectatic, atherosclerotic ascending thoracic aorta to 3.9 cm. Aortic Atherosclerosis (ICD10-I70.0) and Emphysema (ICD10-J43.9). Electronically Signed   By: Ashley Royalty M.D.   On: 02/13/2018 19:15    DISCUSSION: Complex 62 year old with severe coronary artery disease, diastolic heart failure, COPD, OSA, lupus presenting with progressive dyspnea, hypoxia Echo findings reviewed which shows volume and pressure overload of the right ventricle.  Suspect she has severe cor pulmonale Unable to diurese aggressively due to bump in creatinine Previous serologies noted for positive centromere  Suspect that she has pulmonary hypertension from combination of group 1 (primary arterial pulmonary hypertension from lupus,?  Crest syndrome), group 2 (coronary artery disease, diastolic dysfunction) and group 3 (COPD, mild OSA) CT scan reviewed  which shows no acute lung process except for chronic emphysematous changes are stable pulmonary nodules  Continue diuresis as long as renal function allows Continue bronchodilators Supplemental oxygen to prevent hypoxia Recheck ANA and connective tissue serologies Start CPAP at night There are no good options for her.  I will discuss with cardiology to see if pulmonary vasodilators are appropriate here  Marshell Garfinkel MD [facility] Pager [phone] If no answer or after 3pm call: [phone] 02/14/2018, 4:58 PM

## 2018-02-14 NOTE — Progress Notes (Signed)
Patient is refusing the use of CPAP for tonight. RT informed patient if she changes her mind have RN contact RT.

## 2018-02-15 ENCOUNTER — Inpatient Hospital Stay (HOSPITAL_COMMUNITY)
Admission: EM | Disposition: A | Discharge status: Home Health | Payer: Self-pay | Source: Home / Self Care | Attending: Internal Medicine

## 2018-02-15 ENCOUNTER — Encounter (HOSPITAL_COMMUNITY): Payer: Medicare Other

## 2018-02-15 ENCOUNTER — Inpatient Hospital Stay (HOSPITAL_COMMUNITY): Payer: Medicare Other

## 2018-02-15 ENCOUNTER — Encounter (HOSPITAL_COMMUNITY): Payer: Self-pay | Admitting: Internal Medicine

## 2018-02-15 DIAGNOSIS — I272 Pulmonary hypertension, unspecified: Secondary | ICD-10-CM

## 2018-02-15 DIAGNOSIS — Z8739 Personal history of other diseases of the musculoskeletal system and connective tissue: Secondary | ICD-10-CM

## 2018-02-15 HISTORY — PX: RIGHT HEART CATH: CATH118263

## 2018-02-15 LAB — POCT I-STAT 3, ART BLOOD GAS (G3+)
Acid-base deficit: 1 mmol/L (ref 0.0–2.0)
Bicarbonate: 23.2 mmol/L (ref 20.0–28.0)
O2 SAT: 90 %
PCO2 ART: 36.6 mmHg (ref 32.0–48.0)
PO2 ART: 57 mmHg — AB (ref 83.0–108.0)
TCO2: 24 mmol/L (ref 22–32)
pH, Arterial: 7.41 (ref 7.350–7.450)

## 2018-02-15 LAB — GLUCOSE, CAPILLARY: Glucose-Capillary: 86 mg/dL (ref 65–99)

## 2018-02-15 LAB — POCT I-STAT 3, VENOUS BLOOD GAS (G3P V)
Acid-Base Excess: 2 mmol/L (ref 0.0–2.0)
Acid-Base Excess: 4 mmol/L — ABNORMAL HIGH (ref 0.0–2.0)
BICARBONATE: 26.3 mmol/L (ref 20.0–28.0)
BICARBONATE: 28 mmol/L (ref 20.0–28.0)
O2 Saturation: 59 %
O2 Saturation: 66 %
PCO2 VEN: 39.8 mmHg — AB (ref 44.0–60.0)
PCO2 VEN: 40 mmHg — AB (ref 44.0–60.0)
PH VEN: 7.455 — AB (ref 7.250–7.430)
PO2 VEN: 30 mmHg — AB (ref 32.0–45.0)
PO2 VEN: 32 mmHg (ref 32.0–45.0)
TCO2: 27 mmol/L (ref 22–32)
TCO2: 29 mmol/L (ref 22–32)
pH, Ven: 7.425 (ref 7.250–7.430)

## 2018-02-15 SURGERY — RIGHT HEART CATH
Anesthesia: LOCAL

## 2018-02-15 MED ORDER — LIDOCAINE HCL (PF) 1 % IJ SOLN
INTRAMUSCULAR | Status: DC | PRN
  Administered 2018-02-15: 15 mL via INTRADERMAL

## 2018-02-15 MED ORDER — HEPARIN (PORCINE) IN NACL 2-0.9 UNIT/ML-% IJ SOLN
INTRAMUSCULAR | Status: AC
  Filled 2018-02-15: qty 500

## 2018-02-15 MED ORDER — ACETAMINOPHEN 500 MG PO TABS
1000.0000 mg | ORAL_TABLET | Freq: Four times a day (QID) | ORAL | Status: DC | PRN
  Administered 2018-02-15 – 2018-02-16 (×3): 1000 mg via ORAL
  Filled 2018-02-15 (×4): qty 2

## 2018-02-15 MED ORDER — ONDANSETRON HCL 4 MG/2ML IJ SOLN: 4.0000 mg | Freq: Four times a day (QID) | INTRAMUSCULAR | Status: DC | PRN

## 2018-02-15 MED ORDER — APIXABAN 5 MG PO TABS
5.0000 mg | ORAL_TABLET | Freq: Two times a day (BID) | ORAL | Status: DC
  Administered 2018-02-16: 5 mg via ORAL
  Filled 2018-02-15: qty 1

## 2018-02-15 MED ORDER — SODIUM CHLORIDE 0.9% FLUSH: 3.0000 mL | INTRAVENOUS | Status: DC | PRN

## 2018-02-15 MED ORDER — FENTANYL CITRATE (PF) 100 MCG/2ML IJ SOLN
INTRAMUSCULAR | Status: AC
  Filled 2018-02-15: qty 2

## 2018-02-15 MED ORDER — HEPARIN (PORCINE) IN NACL 2-0.9 UNIT/ML-% IJ SOLN
INTRAMUSCULAR | Status: AC | PRN
  Administered 2018-02-15: 500 mL

## 2018-02-15 MED ORDER — MIDAZOLAM HCL 2 MG/2ML IJ SOLN
INTRAMUSCULAR | Status: DC | PRN
  Administered 2018-02-15: 1 mg via INTRAVENOUS

## 2018-02-15 MED ORDER — ACETAMINOPHEN 325 MG PO TABS
650.0000 mg | ORAL_TABLET | ORAL | Status: DC | PRN
  Administered 2018-02-15: 650 mg via ORAL
  Filled 2018-02-15: qty 2

## 2018-02-15 MED ORDER — TECHNETIUM TC 99M PYROPHOSPHATE
20.0000 | Freq: Once | INTRAVENOUS | Status: AC
  Administered 2018-02-15: 20 via INTRAVENOUS
  Filled 2018-02-15: qty 20

## 2018-02-15 MED ORDER — MIDAZOLAM HCL 2 MG/2ML IJ SOLN
INTRAMUSCULAR | Status: AC
  Filled 2018-02-15: qty 2

## 2018-02-15 MED ORDER — FENTANYL CITRATE (PF) 100 MCG/2ML IJ SOLN
INTRAMUSCULAR | Status: DC | PRN
  Administered 2018-02-15: 25 ug via INTRAVENOUS

## 2018-02-15 MED ORDER — IPRATROPIUM-ALBUTEROL 0.5-2.5 (3) MG/3ML IN SOLN
3.0000 mL | Freq: Four times a day (QID) | RESPIRATORY_TRACT | Status: DC
  Administered 2018-02-15 – 2018-02-16 (×3): 3 mL via RESPIRATORY_TRACT
  Filled 2018-02-15 (×3): qty 3

## 2018-02-15 MED ORDER — LIDOCAINE HCL 1 % IJ SOLN
INTRAMUSCULAR | Status: AC
  Filled 2018-02-15: qty 20

## 2018-02-15 MED ORDER — SODIUM CHLORIDE 0.9 % IV SOLN
250.0000 mL | INTRAVENOUS | Status: DC | PRN
Start: 2018-02-15 — End: 2018-02-16

## 2018-02-15 MED ORDER — KETOROLAC TROMETHAMINE 15 MG/ML IJ SOLN
15.0000 mg | Freq: Once | INTRAMUSCULAR | Status: AC
  Administered 2018-02-15: 15 mg via INTRAVENOUS
  Filled 2018-02-15: qty 1

## 2018-02-15 MED ORDER — SODIUM CHLORIDE 0.9% FLUSH
3.0000 mL | Freq: Two times a day (BID) | INTRAVENOUS | Status: DC
  Administered 2018-02-15 – 2018-02-16 (×2): 3 mL via INTRAVENOUS

## 2018-02-15 MED ORDER — SILDENAFIL CITRATE 20 MG PO TABS
20.0000 mg | ORAL_TABLET | Freq: Three times a day (TID) | ORAL | Status: DC
Start: 2018-02-15 — End: 2018-02-16
  Administered 2018-02-15 – 2018-02-16 (×4): 20 mg via ORAL
  Filled 2018-02-15 (×4): qty 1

## 2018-02-15 SURGICAL SUPPLY — 10 items
CATH SWAN GANZ 7F STRAIGHT (CATHETERS) ×1 IMPLANT
CATH SWAN GANZ VIP 7.5F (CATHETERS) IMPLANT
PACK CARDIAC CATHETERIZATION (CUSTOM PROCEDURE TRAY) ×2 IMPLANT
PROTECTION STATION PRESSURIZED (MISCELLANEOUS) ×2
SHEATH AVANTI 11CM 7FR (SHEATH) ×1 IMPLANT
STATION PROTECTION PRESSURIZED (MISCELLANEOUS) IMPLANT
TRANSDUCER W/STOPCOCK (MISCELLANEOUS) ×2 IMPLANT
TUBING ART PRESS 72  MALE/FEM (TUBING) ×1
TUBING ART PRESS 72 MALE/FEM (TUBING) IMPLANT
WIRE EMERALD 3MM-J .025X260CM (WIRE) ×1 IMPLANT

## 2018-02-15 NOTE — Progress Notes (Signed)
Pt is alert and oriented restless, in pain uncontrolled with pain meds MD is aware. Plan fo have nuclear med study.

## 2018-02-15 NOTE — H&P (View-Only) (Signed)
Advanced Heart Failure Rounding Note   Subjective:    Remains SOB with any exertion. Anxious about cath. No CP.     Objective:   Weight Range:  Vital Signs:   Temp:  [98 F (36.7 C)-98.2 F (36.8 C)] 98.2 F (36.8 C) (04/02 0533) Pulse Rate:  [76-86] 86 (04/02 0533) Resp:  [17-18] 18 (04/02 0533) BP: (105-120)/(67-92) 120/92 (04/02 0533) SpO2:  [92 %-99 %] 95 % (04/02 0533) Weight:  [96 kg (211 lb 11.2 oz)] 96 kg (211 lb 11.2 oz) (04/02 0533) Last BM Date: 02/14/18  Weight change: Filed Weights   02/13/18 0505 02/14/18 0641 02/15/18 0533  Weight: 98.3 kg (216 lb 11.4 oz) 97.7 kg (215 lb 6.4 oz) 96 kg (211 lb 11.2 oz)    Intake/Output:   Intake/Output Summary (Last 24 hours) at 02/15/2018 0741 Last data filed at 02/15/2018 0717 Gross per 24 hour  Intake 360 ml  Output 1400 ml  Net -1040 ml     Physical Exam: General:  Sitting up in bed wearing O2. No resp difficulty HEENT: normal Neck: supple. JVP 9-10 . Carotids 2+ bilat; no bruits. No lymphadenopathy or thryomegaly appreciated. Cor: PMI nondisplaced. Regular rate & rhythm. 2/6 TR Lungs: clear with decreased BS throughout  Abdomen: soft, nontender, nondistended. No hepatosplenomegaly. No bruits or masses. Good bowel sounds. Extremities: no cyanosis, clubbing, rash, tr edema Neuro: alert & orientedx3, cranial nerves grossly intact. moves all 4 extremities w/o difficulty. Affect pleasant  Telemetry: SR 80-90 Personally reviewed   Labs: Basic Metabolic Panel: Recent Labs  Lab 02/08/18 1607 02/11/18 1131 02/12/18 0227 02/13/18 0442 02/14/18 0201  NA 139 136 137 137 136  K 3.5 3.1* 4.0 4.9 4.2  CL 105 99* 102 105 101  CO2 22 24 21* 20* 22  GLUCOSE 89 98 93 95 94  BUN _0 21* 20  CREATININE 0.80 1.08* 1.12* 1.14* 0.95  CALCIUM 9.5 9.2 9.4 9.3 9.0  MG  --  1.6* 2.1 2.0  --     Liver Function Tests: Recent Labs  Lab 02/11/18 1131  AST 28  ALT 20  ALKPHOS 141*  BILITOT 1.1  PROT 8.1    ALBUMIN 3.9   No results for input(s): LIPASE, AMYLASE in the last 168 hours. No results for input(s): AMMONIA in the last 168 hours.  CBC: Recent Labs  Lab 02/08/18 1607 02/11/18 1131 02/12/18 0227  WBC 5.8 6.6 6.0  NEUTROABS  --  4.0  --   HGB 13.0 13.8 14.3  HCT 42.3 43.5 45.1  MCV 86.7 86.8 87.2  PLT 224 235 230    Cardiac Enzymes: Recent Labs  Lab 02/14/18 1854  CKTOTAL 89    BNP: BNP (last 3 results) Recent Labs    09/16/17 0800 02/08/18 1607 02/11/18 1131  BNP 668.9* 1,043.9* 575.1*    ProBNP (last 3 results) Recent Labs    08/06/17 1227  PROBNP 2,160.0*      Other results:  Imaging: Ct Chest Wo Contrast  Result Date: 02/13/2018 CLINICAL DATA:  Pulmonary hypertension and emphysema.  Dyspnea. EXAM: CT CHEST WITHOUT CONTRAST TECHNIQUE: Multidetector CT imaging of the chest was performed following the standard protocol without IV contrast. COMPARISON:  02/11/2018 CXR, chest CT 09/16/2017, 11/24/2011 and priors to 2009 FINDINGS: Cardiovascular: Patient is status post CABG. Heart size is top-normal without pericardial effusion. Native left main and three-vessel coronary arteriosclerosis. Dilatation of the main pulmonary artery 4.1 cm compatible with changes chronic pulmonary hypertension, similar in appearance  to prior. Atherosclerotic nonaneurysmal thoracic aorta with ectasia of the ascending thoracic aorta to 3.9 cm. Mediastinum/Nodes: Hypodense 12 mm left thyroid nodule, less well visualized on this unenhanced study. No significant progression in size. Midline trachea. Unremarkable thoracic esophagus. Stable 13 mm short axis precarinal lymph node. No definite hilar hilar adenopathy given limitations of this noncontrast exam. Lungs/Pleura: Centrilobular emphysema noted bilaterally without acute pneumonic consolidation, effusion or pneumothorax. Redemonstration of bilateral pulmonary nodules, the largest are in the right middle lobe ranging in size from 5 mm  through 6.6 mm in on the left, within the lower lobe measuring 5 mm. Upper Abdomen: No acute abnormality. Musculoskeletal: No chest wall mass or suspicious bone lesions identified. IMPRESSION: 1. Stable appearing bilateral pulmonary nodules the largest are in the right middle lobe measuring up to 6.6 mm dating back to 2009 consistent with benign findings. 2. Stable 12 mm left thyroid nodule. 3. Diffuse moderate centrilobular emphysema without acute pneumonic consolidation or CHF. 4. Status post CABG. 5. Ectatic, atherosclerotic ascending thoracic aorta to 3.9 cm. Aortic Atherosclerosis (ICD10-I70.0) and Emphysema (ICD10-J43.9). Electronically Signed   By: Ashley Royalty M.D.   On: 02/13/2018 19:15      Medications:     Scheduled Medications: . clopidogrel  75 mg Oral Daily  . fluticasone  2 spray Each Nare Daily  . ipratropium-albuterol  3 mL Nebulization Q4H  . mouth rinse  15 mL Mouth Rinse BID  . metoprolol succinate  50 mg Oral Daily  . pantoprazole  40 mg Oral Daily  . roflumilast  500 mcg Oral Daily  . rosuvastatin  20 mg Oral QHS  . sodium chloride flush  3 mL Intravenous Q12H  . torsemide  60 mg Oral Daily  . traZODone  50 mg Oral QHS     Infusions: . sodium chloride    . sodium chloride       PRN Medications:  sodium chloride, acetaminophen, diclofenac sodium, methocarbamol, nitroGLYCERIN, senna-docusate, sodium chloride flush   Assessment:   Elizabeth [name] is a 62 year old with a history of CAD CABG 7/2017w/ LIMA-LAD, SVG-OM-CFX, SVG-RCA, COPD, HTN, hyperlipidemia, fibromyalgia, GERD, PAF formerly on amio now on eliquis, remote cocaine use, and lupus.   Plan/Discussion:    1. A/C Combined Systolic/Diastolic Heart Failure - on ECHO RV worsening from previous 09/2017  - Volume status looks good after diuresis. For RHC today to confrim    2. PAH  - Likely primarily WHO group 3 (COPD)with component of Group 2.However PFTs from 2017 not too bad - With ANA positive (H/o  Lupus) and progression RV failure over past 2 years  may have Group I disease - RF negative.  - RHC this am - TTR Cardiac amyloid also a possibility. Will need PYP study  3. Acute on chronic hypoxemic respiratory failure -multifactorial NYHA IIIB - stable on home O2  4. CAD:  Hx of CABG X4 05/2016. On BB, ARB and statin. Not on aspirin due to need for anticoagulation. Troponin negative X3. Eliquis was held. -Cath11/5/18 withpotential culprit lesions are the graft lesion and SVG-OM-dCx as well as the anastomotic lesion and OM 3 with DES.  -No s/s ischemia - On plavix and statin.   5. PAF - Maintaining NSR. Restart Eliquis after cath  6. Obesity Body mass index is 39.4 kg/m.   7. DNR   Length of Stay: 4   Glori Bickers MD 02/15/2018, 7:41 AM  Advanced Heart Failure Team Pager [phone] (M-F; 7a - 4p)  Please contact [facility] for night-coverage after hours (4p -7a ) and weekends on [email or web address]

## 2018-02-15 NOTE — Progress Notes (Signed)
Subjective: Elizabeth [name] was seen resting in bed this morning. She states that her pain is persistent and she had not received as much tylenol as she had in the past. She continues to have baseline dyspnea. The results of her RHC were discussed with her as well as the recommendation that she be started on sildenafil for her pulmonary HTN, she stated she had already been informed about the sildenafil. She states that she was told she has another procedure tomorrow. She has no other complaints or questions this morning.  Objective:  Vital signs in last 24 hours: Vitals:   02/14/18 1929 02/14/18 2102 02/15/18 0338 02/15/18 0533  BP:  110/70  (!) 120/92  Pulse:  76  86  Resp:  17  18  Temp:  98 F (36.7 C)  98.2 F (36.8 C)  TempSrc:  Oral  Oral  SpO2: 96% 98% 99% 95%  Weight:    211 lb 11.2 oz (96 kg)  Height:       Physical Exam  Constitutional: She is oriented to person, place, and time. She appears well-developed and well-nourished.  Cardiovascular: Normal rate, regular rhythm, normal heart sounds and intact distal pulses.  Pulmonary/Chest: Breath sounds normal.  Mildly increased work of breathing, speaking in full sentecnes  Abdominal: Soft. Bowel sounds are normal. She exhibits no distension.  Right flank tender to palpation Abdomen nontender  Musculoskeletal: She exhibits no edema or deformity.  Neurological: She is alert and oriented to person, place, and time.  Skin: Skin is warm and dry.   Assessment/Plan: [name] with hx of COPD, chronic respiratory failure on 2 liters home oxygen, pulmonary HTN, chronic combined systolic and diastolic biventricularCHF (EF 40-45%, G1DD), CAD s/p CABG who presented with hypoxia and right side pain. She was at pulmonology office visit on 3/29 with shortness of breath and desated to 88% with ambulation, there was concern for volume overload so she was advised to present to the ED.   A/C Hypoxic Respiratory Failure A/C  combined biventricular CHF Pulmonary HTN: Presented with worsening of her chronic dyspnea with concern for CHF exacerbation. Minimal improvement with diuretics. Echo 3/30: EF 45%, G1DD, severe RV dilation with increased wall thickness, septal flattening, PA pressure 47 mmHg, IVC normal in size; recommended evaluation for cardiac amyloidosis. CT chest showed stable nodules (since 2009), Diffuse moderate centrilobular emphysema. ABG showed pO2 elevated at 127 with all other values normal. 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 - Dyspnea persistent, but is at baseline and on 2.5L O2  - Appreciate pulmonology and cardiology recommendations - Autoimmune panel per Pulmonology Pending - 81mc-PYP Nuclear study to evaluate for Amyloidosis per Cardiology - PAH believed to be multifactorial, 438mg on Echo, patient to [place] [facility]/2/19 showed Mean PAP 4550m - Patient started on Sildenafil 57m15mD for Pulmonary HTN - On home Metoprolol 50mg17mly and Torsemide 60mg 48my - Supplemental O2 for goal SpO2 88%   - Duonebs PRN as below  - PFTs ordered   COPD, group D, by GOLD 2017 classification (HCC) [place] Hypertension - Appreciate pulmonology recommendations - Supplemental O2 for goal SpO2 88%   - Duoneb PRN for SOB or wheezing - Continue Trelegy  Right side pain  Pain is improved somewhat following trigger point injection on 02/13/18. Pain currently tolerable with tylenol only for pain control. (Morphine was ineffective and was discontiuned, Toradol helped but was discontinued after AKI).  AKI resolved and patient requesting additional relief for break through pain with tylenol (as dose was decreased to 650 PRN temporarily). - Tylenol 1028m q6h PRN - Toradol 176mOnce IV   Dispo: Anticipated discharge in approximately 0-1 day(s).   MeNeva SeatMD 02/15/2018, 6:16 AM Pager:  [phone]

## 2018-02-15 NOTE — Progress Notes (Signed)
Pt is scheduled to having surgery this am, consent in chart, rt groin clipping done, CHG bath given  Palma Holter, RN

## 2018-02-15 NOTE — Progress Notes (Signed)
Patient is refusing the use of CPAP for tonight stating she doesn't feel well. RT informed patient if she changes her mind have RN contact RT.

## 2018-02-15 NOTE — Progress Notes (Signed)
I saw and examined the patient. I reviewed the resident's note and I agree with the resident's findings and plan as documented in the resident's note.  Pulmonary artery hypertension confirmed on RHC today, normal wedge pressure, good Fick cardiac output. This makes a group 1 PAH more likely, especially given history of lupus. I agree with trial of sildenafil, we will see how she responds.   In addition, based on LVH pattern, reduced EF, relatively young age, I agree with rule out of amyloidosis with bone scan.    Axel Filler, MD 02/15/2018, 2:47 PM

## 2018-02-15 NOTE — Progress Notes (Signed)
Advanced Heart Failure Rounding Note   Subjective:    Remains SOB with any exertion. Anxious about cath. No CP.     Objective:   Weight Range:  Vital Signs:   Temp:  [98 F (36.7 C)-98.2 F (36.8 C)] 98.2 F (36.8 C) (04/02 0533) Pulse Rate:  [76-86] 86 (04/02 0533) Resp:  [17-18] 18 (04/02 0533) BP: (105-120)/(67-92) 120/92 (04/02 0533) SpO2:  [92 %-99 %] 95 % (04/02 0533) Weight:  [96 kg (211 lb 11.2 oz)] 96 kg (211 lb 11.2 oz) (04/02 0533) Last BM Date: 02/14/18  Weight change: Filed Weights   02/13/18 0505 02/14/18 0641 02/15/18 0533  Weight: 98.3 kg (216 lb 11.4 oz) 97.7 kg (215 lb 6.4 oz) 96 kg (211 lb 11.2 oz)    Intake/Output:   Intake/Output Summary (Last 24 hours) at 02/15/2018 0741 Last data filed at 02/15/2018 0717 Gross per 24 hour  Intake 360 ml  Output 1400 ml  Net -1040 ml     Physical Exam: General:  Sitting up in bed wearing O2. No resp difficulty HEENT: normal Neck: supple. JVP 9-10 . Carotids 2+ bilat; no bruits. No lymphadenopathy or thryomegaly appreciated. Cor: PMI nondisplaced. Regular rate & rhythm. 2/6 TR Lungs: clear with decreased BS throughout  Abdomen: soft, nontender, nondistended. No hepatosplenomegaly. No bruits or masses. Good bowel sounds. Extremities: no cyanosis, clubbing, rash, tr edema Neuro: alert & orientedx3, cranial nerves grossly intact. moves all 4 extremities w/o difficulty. Affect pleasant  Telemetry: SR 80-90 Personally reviewed   Labs: Basic Metabolic Panel: Recent Labs  Lab 02/08/18 1607 02/11/18 1131 02/12/18 0227 02/13/18 0442 02/14/18 0201  NA 139 136 137 137 136  K 3.5 3.1* 4.0 4.9 4.2  CL 105 99* 102 105 101  CO2 22 24 21* 20* 22  GLUCOSE 89 98 93 95 94  BUN _0 21* 20  CREATININE 0.80 1.08* 1.12* 1.14* 0.95  CALCIUM 9.5 9.2 9.4 9.3 9.0  MG  --  1.6* 2.1 2.0  --     Liver Function Tests: Recent Labs  Lab 02/11/18 1131  AST 28  ALT 20  ALKPHOS 141*  BILITOT 1.1  PROT 8.1    ALBUMIN 3.9   No results for input(s): LIPASE, AMYLASE in the last 168 hours. No results for input(s): AMMONIA in the last 168 hours.  CBC: Recent Labs  Lab 02/08/18 1607 02/11/18 1131 02/12/18 0227  WBC 5.8 6.6 6.0  NEUTROABS  --  4.0  --   HGB 13.0 13.8 14.3  HCT 42.3 43.5 45.1  MCV 86.7 86.8 87.2  PLT 224 235 230    Cardiac Enzymes: Recent Labs  Lab 02/14/18 1854  CKTOTAL 89    BNP: BNP (last 3 results) Recent Labs    09/16/17 0800 02/08/18 1607 02/11/18 1131  BNP 668.9* 1,043.9* 575.1*    ProBNP (last 3 results) Recent Labs    08/06/17 1227  PROBNP 2,160.0*      Other results:  Imaging: Ct Chest Wo Contrast  Result Date: 02/13/2018 CLINICAL DATA:  Pulmonary hypertension and emphysema.  Dyspnea. EXAM: CT CHEST WITHOUT CONTRAST TECHNIQUE: Multidetector CT imaging of the chest was performed following the standard protocol without IV contrast. COMPARISON:  02/11/2018 CXR, chest CT 09/16/2017, 11/24/2011 and priors to 2009 FINDINGS: Cardiovascular: Patient is status post CABG. Heart size is top-normal without pericardial effusion. Native left main and three-vessel coronary arteriosclerosis. Dilatation of the main pulmonary artery 4.1 cm compatible with changes chronic pulmonary hypertension, similar in appearance  to prior. Atherosclerotic nonaneurysmal thoracic aorta with ectasia of the ascending thoracic aorta to 3.9 cm. Mediastinum/Nodes: Hypodense 12 mm left thyroid nodule, less well visualized on this unenhanced study. No significant progression in size. Midline trachea. Unremarkable thoracic esophagus. Stable 13 mm short axis precarinal lymph node. No definite hilar hilar adenopathy given limitations of this noncontrast exam. Lungs/Pleura: Centrilobular emphysema noted bilaterally without acute pneumonic consolidation, effusion or pneumothorax. Redemonstration of bilateral pulmonary nodules, the largest are in the right middle lobe ranging in size from 5 mm  through 6.6 mm in on the left, within the lower lobe measuring 5 mm. Upper Abdomen: No acute abnormality. Musculoskeletal: No chest wall mass or suspicious bone lesions identified. IMPRESSION: 1. Stable appearing bilateral pulmonary nodules the largest are in the right middle lobe measuring up to 6.6 mm dating back to 2009 consistent with benign findings. 2. Stable 12 mm left thyroid nodule. 3. Diffuse moderate centrilobular emphysema without acute pneumonic consolidation or CHF. 4. Status post CABG. 5. Ectatic, atherosclerotic ascending thoracic aorta to 3.9 cm. Aortic Atherosclerosis (ICD10-I70.0) and Emphysema (ICD10-J43.9). Electronically Signed   By: David  Kwon M.D.   On: 02/13/2018 19:15      Medications:     Scheduled Medications: . clopidogrel  75 mg Oral Daily  . fluticasone  2 spray Each Nare Daily  . ipratropium-albuterol  3 mL Nebulization Q4H  . mouth rinse  15 mL Mouth Rinse BID  . metoprolol succinate  50 mg Oral Daily  . pantoprazole  40 mg Oral Daily  . roflumilast  500 mcg Oral Daily  . rosuvastatin  20 mg Oral QHS  . sodium chloride flush  3 mL Intravenous Q12H  . torsemide  60 mg Oral Daily  . traZODone  50 mg Oral QHS     Infusions: . sodium chloride    . sodium chloride       PRN Medications:  sodium chloride, acetaminophen, diclofenac sodium, methocarbamol, nitroGLYCERIN, senna-docusate, sodium chloride flush   Assessment:   Ms Mosteller is a 62 year old with a history of CAD CABG 7/2017w/ LIMA-LAD, SVG-OM-CFX, SVG-RCA, COPD, HTN, hyperlipidemia, fibromyalgia, GERD, PAF formerly on amio now on eliquis, remote cocaine use, and lupus.   Plan/Discussion:    1. A/C Combined Systolic/Diastolic Heart Failure - on ECHO RV worsening from previous 09/2017  - Volume status looks good after diuresis. For RHC today to confrim    2. PAH  - Likely primarily WHO group 3 (COPD)with component of Group 2.However PFTs from 2017 not too bad - With ANA positive (H/o  Lupus) and progression RV failure over past 2 years  may have Group I disease - RF negative.  - RHC this am - TTR Cardiac amyloid also a possibility. Will need PYP study  3. Acute on chronic hypoxemic respiratory failure -multifactorial NYHA IIIB - stable on home O2  4. CAD:  Hx of CABG X4 05/2016. On BB, ARB and statin. Not on aspirin due to need for anticoagulation. Troponin negative X3. Eliquis was held. -Cath11/5/18 withpotential culprit lesions are the graft lesion and SVG-OM-dCx as well as the anastomotic lesion and OM 3 with DES.  -No s/s ischemia - On plavix and statin.   5. PAF - Maintaining NSR. Restart Eliquis after cath  6. Obesity Body mass index is 39.4 kg/m.   7. DNR   Length of Stay: 4   Orlin Kann MD 02/15/2018, 7:41 AM  Advanced Heart Failure Team Pager [phone] (M-F; 7a - 4p)  Please contact [facility]   Cardiology for night-coverage after hours (4p -7a ) and weekends on [email or web address]

## 2018-02-15 NOTE — Interval H&P Note (Signed)
History and Physical Interval Note:  02/15/2018 7:46 AM  Elizabeth [name]  has presented today for surgery, with the diagnosis of hp  The various methods of treatment have been discussed with the patient and family. After consideration of risks, benefits and other options for treatment, the patient has consented to  Procedure(s): RIGHT HEART CATH (N/A) as a surgical intervention .  The patient's history has been reviewed, patient examined, no change in status, stable for surgery.  I have reviewed the patient's chart and labs.  Questions were answered to the patient's satisfaction.     Daniel Bensimhon

## 2018-02-15 NOTE — Progress Notes (Signed)
RHC results  Findings:  RA = 10 RV = 76/10 PA = 71/33 (45) PCW = 5 Fick cardiac output/index = 5.0/2.5 Thermo CO/CI = 5.6/2.8 PVR = 7.2 WU FA sat = 90% PA sat = 59%, 66%  Assessment: 1. Moderate pulmonary HTN with normal wedge pressure after diuresis 2. Markedly elevated PVR  Plan/Discussion:  Will start sildenafil 20 TID. Watch oxygenation closely.   Glori Bickers, MD  8:24 AM

## 2018-02-15 NOTE — Progress Notes (Signed)
PULMONARY / CRITICAL CARE MEDICINE   Name: Elizabeth [name] MRN: [id number] DOB: 02-15-1956    ADMISSION DATE:  02/11/2018 CONSULTATION DATE:    Loistine Chance MD:  Axel Filler MD  CHIEF COMPLAINT:  Dyspnea  HISTORY OF PRESENT ILLNESS:   62 year old with past medical history of lupus, heart failure, coronary artery disease s/p CABG in July 2017 paroxysmal A. fib, COPD, hypertension, pulmonary HTN.  Seen in pulmonary clinic on 3/29 with hypoxia, dyspnea and sent to the ED. In the hospital she has been diuresed with increase in creatinine however she continues to be dyspneic and hypoxic.  PCCM consulted for help with management.   SUBJECTIVE:  Had RHC this AM with moderate pulmonary HTN, elevated PVR and normal wedge noted.  Exact numbers as follows: RA = 10 RV = 76/10 PA = 71/33 (45) PCW = 5 Fick cardiac output/index = 5.0/2.5 Thermo CO/CI = 5.6/2.8 PVR = 7.2 WU FA sat = 90% PA sat = 59%, 66%  Cardiology starting sildenafil 8m TID.  Pt is very frustrated this AM.  She is not happy with Dr. MVaughan Browneras she says that yesterday he told her that there wasn't much else he could do to help her.  She would like to see a different pulmonologist.  I have relayed this to Dr. MVaughan Browner   VITAL SIGNS: BP 103/77 (BP Location: Right Arm)   Pulse 84   Temp (!) 97.5 F (36.4 C) (Oral)   Resp (!) 21   Ht _0  (1.575 m)   Wt 96 kg (211 lb 11.2 oz)   LMP 12/30/1994   SpO2 93%   BMI 38.72 kg/m   HEMODYNAMICS:    VENTILATOR SETTINGS:    INTAKE / OUTPUT: I/O last 3 completed shifts: In: 796[P.O.:780] Out: 2150 [Urine:2150]  PHYSICAL EXAMINATION: Gen:      Adult female, sitting up in bed, in NAD, frustrated as she talks to case management HEENT:  EOMI, sclera anicteric Neck:     No masses; no thyromegaly Lungs:    Clear to auscultation bilaterally; normal respiratory effort CV:         RRR, no M/R/G Abd:      BS x 4, soft, non-tender; no palpable masses, no  distension Ext:    No edema, no deformities Skin:      Warm and dry; no rash Neuro:   Alert and oriented x 3  LABS:  BMET Recent Labs  Lab 02/12/18 0227 02/13/18 0442 02/14/18 0201  NA 137 137 136  K 4.0 4.9 4.2  CL 102 105 101  CO2 21* 20* 22  BUN 16 21* 20  CREATININE 1.12* 1.14* 0.95  GLUCOSE 93 95 94    Electrolytes Recent Labs  Lab 02/11/18 1131 02/12/18 0227 02/13/18 0442 02/14/18 0201  CALCIUM 9.2 9.4 9.3 9.0  MG 1.6* 2.1 2.0  --     CBC Recent Labs  Lab 02/08/18 1607 02/11/18 1131 02/12/18 0227  WBC 5.8 6.6 6.0  HGB 13.0 13.8 14.3  HCT 42.3 43.5 45.1  PLT 224 235 230    Coag's Recent Labs  Lab 02/14/18 1854  INR 1.22    Sepsis Markers No results for input(s): LATICACIDVEN, PROCALCITON, O2SATVEN in the last 168 hours.  ABG Recent Labs  Lab 02/13/18 1559 02/15/18 0802  PHART 7.432 7.410  PCO2ART 33.6 36.6  PO2ART 127* 57.0*    Liver Enzymes Recent Labs  Lab 02/11/18 1131  AST 28  ALT 20  ALKPHOS 141*  BILITOT  1.1  ALBUMIN 3.9    Cardiac Enzymes No results for input(s): TROPONINI, PROBNP in the last 168 hours.  Glucose Recent Labs  Lab 02/12/18 0630 02/13/18 0659 02/14/18 0638 02/15/18 0641  GLUCAP 91 107* 97 86    Imaging No results found.  STUDIES: RHC 4/2 >   RA = 10, RV = 76/10, PA = 71/33 (45), PCW = 5, Fick cardiac output/index = 5.0/2.5, Thermo CO/CI = 5.6/2.8, PVR = 7.2 WU, FA sat = 90%, PA sat = 59%, 66%   DISCUSSION: Complex 62 year old with severe coronary artery disease, diastolic heart failure, COPD, OSA, lupus presenting with progressive dyspnea, hypoxia Echo findings reviewed which shows volume and pressure overload of the right ventricle.  Suspect she has severe cor pulmonale Unable to diurese aggressively due to bump in creatinine Previous serologies noted for positive centromere  Suspect that she has pulmonary hypertension from combination of group 1 (primary arterial pulmonary hypertension  from lupus,?  Crest syndrome), group 2 (coronary artery disease, diastolic dysfunction) and group 3 (COPD, mild OSA).  RHC performed 4/2 confirmed moderate PAH with normal wedge.   CT scan reviewed which shows no acute lung process except for chronic emphysematous changes are stable pulmonary nodules  Plan: Cardiology has seen and recommended sildenafil 72m TID. Continue diuresis as long as renal function allows Continue bronchodilators Supplemental oxygen to prevent hypoxia Recheck ANA and connective tissue serologies (results pending) Start CPAP at night Pt has asked to see a different pulmonologist. I have relayed this with Dr. MVaughan Browner  Rest per primary team.   RMontey Hora PHedgesvillePager: [phone] or ([phone]/12/2017, 10:58 AM

## 2018-02-15 NOTE — Progress Notes (Addendum)
Benefit check in progress for SildenafilAneta Mins [phone]  Patient has insurance with [facility] and Medicaid Memory Argue CMA        # 12.  PATIENT HAS  : MEDICAID [place] ACCESS       EFF-DATE- 12-17-2016       CO-PAY- $ 3.80 FOR EACH RX    3:07 pm -CM to follow up with patient concerning her insurance, she is currently not in the room/ down for testing. Mindi Slicker RN,MHA,BSN

## 2018-02-15 NOTE — Consult Note (Signed)
   [facility] CM Inpatient Consult   02/15/2018  Elizabeth [name] 09-21-1956 [id number]   Met with the patient in regards to restarting [facility] Care Management services based on her needs.  Patient states, "All I get is my nurse calls from [employer] and I don't remember all of the other stuff."  Patient denies remembering the multiple outreaches from [facility] Management team.   She currently denies issues with her medications, transportation, or resources.  Offered the patient a brochure and 24 hour nurse advise line magnet.  She at first declined both then she said,"I will take that magnet if I need to call a nurse."  She states she has a stent placed today and it was for her congested heart failure.  She uses oxygen at home and is seeing the HF doctors.   Patient declined services at this time. Contact information offered and she states, "I don't need it, I will call the number on this [24 hr nurse line magnet].  For questions or referrals,  Natividad Brood, RN BSN [facility] Liaison  [phone] business mobile phone Toll free office [phone]

## 2018-02-16 ENCOUNTER — Inpatient Hospital Stay (HOSPITAL_COMMUNITY): Payer: Medicare Other

## 2018-02-16 ENCOUNTER — Ambulatory Visit (HOSPITAL_COMMUNITY): Payer: Medicare Other

## 2018-02-16 ENCOUNTER — Encounter (HOSPITAL_COMMUNITY): Payer: Medicare Other

## 2018-02-16 DIAGNOSIS — M542 Cervicalgia: Secondary | ICD-10-CM

## 2018-02-16 DIAGNOSIS — M25561 Pain in right knee: Secondary | ICD-10-CM

## 2018-02-16 DIAGNOSIS — R109 Unspecified abdominal pain: Secondary | ICD-10-CM

## 2018-02-16 LAB — ANTI-SMITH ANTIBODY: ENA SM Ab Ser-aCnc: <0.2 AI (ref 0.0–0.9)

## 2018-02-16 LAB — BASIC METABOLIC PANEL
Anion gap: 14 (ref 5–15)
BUN: 16 mg/dL (ref 6–20)
CHLORIDE: 101 mmol/L (ref 101–111)
CO2: 22 mmol/L (ref 22–32)
CREATININE: 0.94 mg/dL (ref 0.44–1.00)
Calcium: 9.6 mg/dL (ref 8.9–10.3)
GFR calc Af Amer: >60 mL/min (ref >60)
GFR calc non Af Amer: >60 mL/min (ref >60)
GLUCOSE: 98 mg/dL (ref 65–99)
POTASSIUM: 3.8 mmol/L (ref 3.5–5.1)
SODIUM: 137 mmol/L (ref 135–145)

## 2018-02-16 LAB — ANTI-DNA ANTIBODY, DOUBLE-STRANDED

## 2018-02-16 LAB — RHEUMATOID FACTOR

## 2018-02-16 LAB — CENTROMERE ANTIBODIES: Centromere Ab Screen: >8 AI — ABNORMAL HIGH (ref 0.0–0.9)

## 2018-02-16 LAB — ANTI-SCLERODERMA ANTIBODY

## 2018-02-16 LAB — GLUCOSE, CAPILLARY: Glucose-Capillary: 98 mg/dL (ref 65–99)

## 2018-02-16 LAB — COMPLEMENT, TOTAL: Compl, Total (CH50): >60 U/mL (ref >41)

## 2018-02-16 LAB — ANA W/REFLEX IF POSITIVE: ANA: POSITIVE — AB

## 2018-02-16 MED ORDER — IPRATROPIUM-ALBUTEROL 0.5-2.5 (3) MG/3ML IN SOLN: 3.0000 mL | Freq: Two times a day (BID) | RESPIRATORY_TRACT | Status: DC

## 2018-02-16 MED ORDER — OXYCODONE-ACETAMINOPHEN 5-325 MG PO TABS
1.0000 | ORAL_TABLET | Freq: Once | ORAL | Status: AC
  Administered 2018-02-16: 1 via ORAL
  Filled 2018-02-16: qty 1

## 2018-02-16 MED ORDER — OXYCODONE-ACETAMINOPHEN 5-325 MG PO TABS: 1.0000 | ORAL_TABLET | Freq: Four times a day (QID) | ORAL | 0 refills | Status: AC | PRN

## 2018-02-16 MED ORDER — KETOROLAC TROMETHAMINE 15 MG/ML IJ SOLN
15.0000 mg | Freq: Once | INTRAMUSCULAR | Status: AC
  Administered 2018-02-16: 15 mg via INTRAVENOUS
  Filled 2018-02-16: qty 1

## 2018-02-16 MED ORDER — IBUPROFEN 200 MG PO TABS
800.0000 mg | ORAL_TABLET | Freq: Four times a day (QID) | ORAL | Status: DC | PRN
  Administered 2018-02-16: 800 mg via ORAL
  Filled 2018-02-16: qty 1

## 2018-02-16 MED ORDER — TRAMADOL HCL 50 MG PO TABS
50.0000 mg | ORAL_TABLET | Freq: Four times a day (QID) | ORAL | Status: AC
  Administered 2018-02-16: 50 mg via ORAL
  Filled 2018-02-16: qty 1

## 2018-02-16 MED ORDER — KETOROLAC TROMETHAMINE 15 MG/ML IJ SOLN: 30.0000 mg | Freq: Once | INTRAMUSCULAR | Status: DC

## 2018-02-16 MED ORDER — SILDENAFIL CITRATE 20 MG PO TABS: 20.0000 mg | ORAL_TABLET | Freq: Three times a day (TID) | ORAL | 0 refills | Status: AC

## 2018-02-16 NOTE — Progress Notes (Signed)
Pt is alert and oriented with fluctuating behaviors.SOB with exertions calm when not in room. Upon entering patient starts to moan in pain.   When moving complains of Right Flank pain.asking for IV pain medication every shift. Gave PRN

## 2018-02-16 NOTE — Plan of Care (Signed)
  Problem: Education: Goal: Knowledge of General Education information will improve Outcome: Adequate for Discharge   Problem: Clinical Measurements: Goal: Ability to maintain clinical measurements within normal limits will improve Outcome: Adequate for Discharge Goal: Will remain free from infection Outcome: Adequate for Discharge Goal: Diagnostic test results will improve Outcome: Adequate for Discharge Goal: Respiratory complications will improve Outcome: Adequate for Discharge Goal: Cardiovascular complication will be avoided Outcome: Adequate for Discharge   Problem: Clinical Measurements: Goal: Ability to maintain clinical measurements within normal limits will improve Outcome: Adequate for Discharge   Problem: Clinical Measurements: Goal: Will remain free from infection Outcome: Adequate for Discharge   Problem: Clinical Measurements: Goal: Diagnostic test results will improve Outcome: Adequate for Discharge   Problem: Clinical Measurements: Goal: Respiratory complications will improve Outcome: Adequate for Discharge   Problem: Clinical Measurements: Goal: Respiratory complications will improve Outcome: Adequate for Discharge   Problem: Clinical Measurements: Goal: Cardiovascular complication will be avoided Outcome: Adequate for Discharge   Problem: Activity: Goal: Risk for activity intolerance will decrease Outcome: Adequate for Discharge   Problem: Activity: Goal: Risk for activity intolerance will decrease Outcome: Adequate for Discharge

## 2018-02-16 NOTE — Progress Notes (Signed)
   Subjective: Elizabeth [name] was seen on rounds this morning. She states that her breathing is back to baseline and she is feeling better except for her ongoing pain that is associated with movement. She continues to endorse pain of her right side and reports some new pains in her right knee and her right neck this morning. Patient tried Ibuprofen and Tramadol during the day with some success and finally found adequate pain control with percocet 5-325. She had no other questions or concerns today.  Objective:  Vital signs in last 24 hours: Vitals:   02/16/18 0643 02/16/18 0700 02/16/18 0828 02/16/18 1248  BP: 105/71   (!) 94/58  Pulse: 84   86  Resp: 18   18  Temp: 98.1 F (36.7 C)   97.8 F (36.6 C)  TempSrc: Oral   Oral  SpO2: 90% 93% 91% 93%  Weight: 212 lb 8 oz (96.4 kg)     Height:       Physical Exam  Constitutional: She is oriented to person, place, and time. She appears well-developed and well-nourished.  Cardiovascular: Normal rate, regular rhythm, normal heart sounds and intact distal pulses.  Pulmonary/Chest: Effort normal and breath sounds normal.  On 3L O2  Abdominal: Soft. Bowel sounds are normal. She exhibits no distension.  Right flank tender to palpation  Musculoskeletal: She exhibits no edema or deformity.  Mild right knee and right neck tenderness  Neurological: She is alert and oriented to person, place, and time.  Skin: Skin is warm and dry.   Assessment/Plan: [name] with hx of COPD, chronic respiratory failure on 2 liters home oxygen, pulmonary HTN, chronic combined systolic and diastolic biventricularCHF (EF 40-45%, G1DD), CAD s/p CABG who presented with hypoxia and right side pain. She was at pulmonology office visit on 3/29 with shortness of breath and desated to 88% with ambulation, there was concern for volume overload so she was advised to present to the ED.   A/C Hypoxic Respiratory Failure A/C combined biventricular CHF Pulmonary  HTN: Presented with worsening of her chronic dyspnea with concern for CHF exacerbation. - She feels her breathing is back to baseline on her home 2-3L O2  - PAH believed to be multifactorial, 33mHg on Echo,  RHC 02/15/18 showed Mean PAP 418mg - Sildenafil 2012mID for Pulmonary HTN - Autoimmune panel per Pulmonology Pending - 57m2mYP Nuclear study negative for sign of ATTR Amyloidosis - MM Panel for AL Amyloidosis - On home Metoprolol 50mg79mly and Torsemide 60mg 59my  - Duonebs PRN as below  - PFTs ordered  COPD, group D, by GOLD 2017 classification (HCC) PElyonary Hypertension - Stable - Supplemental O2 for goal SpO2 88%   - Duoneb PRN for SOB or wheezing - Continue Trelegy  Right side pain  She continues to endorse pain of her right side and reports some new pains in her right knee and her right neck this morning. Patient tried Ibuprofen and Tramadol during the day with some success and finally found adequate pain control with percocet 5-325. States she feels she can manage at home on this medication. - Percocet 5-325mg q59mRN - Ibuprofen 800mg q637mN  Dispo: Anticipated discharge later today.  Melvin, Neva Seat/2019, 4:01 PM Pager: [phone]

## 2018-02-16 NOTE — Progress Notes (Addendum)
Advanced Heart Failure Rounding Note   Subjective:    Started on sildenafil 20 mg TID for moderate pulmonary HTN. Weight up 1 lb. Now on home torsemide dose.   Episode of right sided pain (neck to knee) that was resolved by toradol. Felt worse with deep breaths. EKG unchanged. SOB is the same- feels very SOB with any activity. + productive cough of green to brown sputum. Afebrile. No dizziness.   PYP 4/2: Not suggestive of amyloidosis.   RHC 4/2: RA = 10 RV = 76/10 PA = 71/33 (45) PCW = 5 Fick cardiac output/index = 5.0/2.5 Thermo CO/CI = 5.6/2.8 PVR = 7.2 WU FA sat = 90% PA sat = 59%, 66% Assessment: 1. Moderate pulmonary HTN with normal wedge pressure after diuresis 2. Markedly elevated PVR   Objective:   Weight Range:  Vital Signs:   Temp:  [97.5 F (36.4 C)-98.1 F (36.7 C)] 98.1 F (36.7 C) (04/03 0643) Pulse Rate:  [83-91] 84 (04/03 0643) Resp:  [18-20] 18 (04/03 0643) BP: (103-149)/(65-82) 105/71 (04/03 0643) SpO2:  [90 %-96 %] 91 % (04/03 0828) Weight:  [212 lb 8 oz (96.4 kg)] 212 lb 8 oz (96.4 kg) (04/03 0643) Last BM Date: 02/15/18  Weight change: Filed Weights   02/14/18 0641 02/15/18 0533 02/16/18 0643  Weight: 215 lb 6.4 oz (97.7 kg) 211 lb 11.2 oz (96 kg) 212 lb 8 oz (96.4 kg)    Intake/Output:   Intake/Output Summary (Last 24 hours) at 02/16/2018 0833 Last data filed at 02/16/2018 0130 Gross per 24 hour  Intake 480 ml  Output 500 ml  Net -20 ml     Physical Exam: General: Sitting up in bed. Wearing 3L O2. HEENT: Normal Neck: Supple. JVP difficult, but does not appear elevated. Carotids 2+ bilat; no bruits. No thyromegaly or nodule noted. Cor: PMI nondisplaced. RRR, 2/6 TR Lungs: clear, diminished. On 3L [place]. No rub. Abdomen: Soft, non-tender, non-distended, no HSM. No bruits or masses. +BS  Extremities: No cyanosis, clubbing, or rash. R and LLE no edema.  Neuro: Alert & orientedx3, cranial nerves grossly intact. moves all 4  extremities w/o difficulty. Affect pleasant   Telemetry: SR 80-90s. Personally reviewed.   EKG: SR. Compared with previous. Looks unchanged.   Labs: Basic Metabolic Panel: Recent Labs  Lab 02/11/18 1131 02/12/18 0227 02/13/18 0442 02/14/18 0201  NA 136 137 137 136  K 3.1* 4.0 4.9 4.2  CL 99* 102 105 101  CO2 24 21* 20* 22  GLUCOSE 98 93 95 94  BUN 12 16 21* 20  CREATININE 1.08* 1.12* 1.14* 0.95  CALCIUM 9.2 9.4 9.3 9.0  MG 1.6* 2.1 2.0  --     Liver Function Tests: Recent Labs  Lab 02/11/18 1131  AST 28  ALT 20  ALKPHOS 141*  BILITOT 1.1  PROT 8.1  ALBUMIN 3.9   No results for input(s): LIPASE, AMYLASE in the last 168 hours. No results for input(s): AMMONIA in the last 168 hours.  CBC: Recent Labs  Lab 02/11/18 1131 02/12/18 0227  WBC 6.6 6.0  NEUTROABS 4.0  --   HGB 13.8 14.3  HCT 43.5 45.1  MCV 86.8 87.2  PLT 235 230    Cardiac Enzymes: Recent Labs  Lab 02/14/18 1854  CKTOTAL 89    BNP: BNP (last 3 results) Recent Labs    09/16/17 0800 02/08/18 1607 02/11/18 1131  BNP 668.9* 1,043.9* 575.1*    ProBNP (last 3 results) Recent Labs    08/06/17  [place] 2,160.0*      Other results:  Imaging: Nm Tumor Localization W Spect  Result Date: 02/15/2018 CLINICAL DATA:  HEART FAILURE. CONCERN FOR CARDIAC AMYLOIDOSIS. EXAM: NUCLEAR MEDICINE TUMOR LOCALIZATION. PYP CARDIAC AMYLOIDOSIS SCAN WITH SPECT TECHNIQUE: Following intravenous administration of radiopharmaceutical, anterior planar images of the chest were obtained. Regions of interest were placed on the heart and contralateral chest wall for quantitative assessment. Additional SPECT imaging of the chest was obtained. RADIOPHARMACEUTICALS:  8 mCi TECHNETIUM 99 PYROPHOSPHATE FINDINGS: Planar Visual assessment: Anterior planar imaging demonstrates radiotracer uptake within the heart equal to uptake within the adjacent ribs (Grade 1 to 2). Quantitative assessment : Quantitative assessment of  the cardiac uptake compared to the contralateral chest wall is equal to 1.25 (H/CL = 1.25). SPECT assessment: SPECT imaging of the chest demonstrates minimal radiotracer accumulation within the LEFT ventricle. IMPRESSION: Visual and quantitative assessment (grade 1 to 2, H/CLL equal 1.25 are indeterminate for transthyretin amyloidosis. Favor non suggestive of amyloidosis. Electronically Signed   By: Elizabeth [name] M.D.   On: 02/15/2018 16:07     Medications:     Scheduled Medications: . apixaban  5 mg Oral BID  . clopidogrel  75 mg Oral Daily  . fluticasone  2 spray Each Nare Daily  . ipratropium-albuterol  3 mL Nebulization QID  . mouth rinse  15 mL Mouth Rinse BID  . metoprolol succinate  50 mg Oral Daily  . pantoprazole  40 mg Oral Daily  . roflumilast  500 mcg Oral Daily  . rosuvastatin  20 mg Oral QHS  . sildenafil  20 mg Oral TID  . sodium chloride flush  3 mL Intravenous Q12H  . torsemide  60 mg Oral Daily  . traZODone  50 mg Oral QHS    Infusions: . sodium chloride      PRN Medications: sodium chloride, acetaminophen, diclofenac sodium, methocarbamol, nitroGLYCERIN, ondansetron (ZOFRAN) IV, senna-docusate, sodium chloride flush   Assessment:   Elizabeth [name] is a 62 year old with a history of CAD CABG 7/2017w/ LIMA-LAD, SVG-OM-CFX, SVG-RCA, COPD, HTN, hyperlipidemia, fibromyalgia, GERD, PAF formerly on amio now on eliquis, remote cocaine use, and lupus.   Plan/Discussion:    1. A/C Combined Systolic/Diastolic Heart Failure - Echo 01/2018: EF 45% with grade 1 DD, moderate concentric hypertrophy, PA peak pressure 47, RV severely dilated with increased wall thickness and moderate dysfunction, mild dilation of right and left atrium, septal dyssynergy  - Volume status stable.   - Continue toresmide 60 mg daily  - Continue Toprol XL 50 mg daily - Consider adding spiro 12.5 mg daily. BMET pending this am.   2. PAH  - Likely primarily WHO group 3 (COPD)with component of  Group 2.However PFTs from 2017 not too bad - With ANA positive (H/o Lupus) and progression RV failure over past 2 years  may have Group I disease - RF negative.  - RHC 4/2: moderate pulmonary HTN, PA 71/33, PVR 7.2 - Started on sildenfail 20 mg TID. Sats 91-95 on home 3 L O2 - PYP not suggestive of cardiac amyloidosis.   3. Acute on chronic hypoxemic respiratory failure - multifactorial NYHA IIIB - stable on home O2  4. CAD:  Hx of CABG X4 05/2016. On BB, ARB and statin. Not on aspirin due to need for anticoagulation. Troponin negative X3. Eliquis was held. -Cath11/5/18 withpotential culprit lesions are the graft lesion and SVG-OM-dCx as well as the anastomotic lesion and OM 3 with DES.  - No s/s ischemia -  On plavix and statin.  - EKG overnight for right sided CP - looks unchanged from previous.   5. PAF - Maintaining NSR. Continue eliquis  6. Obesity Body mass index is 38.87 kg/m.  7. DNR  8. Productive cough with green/brown sputum - Will check CXR - Afebrile, but had right sided CP overnight that sounds more pleuritic. Resolved with toradol.    Medication concerns reviewed with patient and pharmacy team. Barriers identified: Possible medication barrier. Medications cost $3.80 for each RX (including sildenafil), CM to follow up with patient.    Length of Stay: [place], NP 02/16/2018, 8:33 AM  Advanced Heart Failure Team Pager [phone] (M-F; 7a - 4p)  Please contact [facility] for night-coverage after hours (4p -7a ) and weekends on [email or web address]  Patient seen and examined with the above-signed Advanced Practice Provider and/or Housestaff. I personally reviewed laboratory data, imaging studies and relevant notes. I independently examined the patient and formulated the important aspects of the plan. I have edited the note to reflect any of my changes or salient points. I have personally discussed the plan with the patient and/or family.  Started on  sildenafil for PAH. Otherwise stable. Will need close f/u in [facility] to assess response and need for combination therapy. Volume status ok.  Glori Bickers, MD  8:30 PM

## 2018-02-16 NOTE — Progress Notes (Signed)
Pt woke up asking for pain meds. Pt complains of right flank pain, chest pain on her right side,right leg pain with pain scale 10/10.  Pt stated " Tylenol and the muscle relaxant that you are giving is not working! I want something stronger than that! My chest is hurting too everytime I breath! " EKG done. MD was updated and ordered Ketoralac IV. Will continue to monitor pt.

## 2018-02-16 NOTE — Progress Notes (Signed)
CM talked to patient about her insurance coverage; patient stated that she does have [place] [employer] and Medicaid with prescription drug coverage; pharmacy of choice is [facility] [phone]

## 2018-02-16 NOTE — Care Management Important Message (Signed)
Important Message  Patient Details  Name: Elizabeth [name] MRN: [id number] Date of Birth: 09-12-1956   Medicare Important Message Given:  Yes    Danna Casella P [place] 02/16/2018, 1:55 PM

## 2018-02-16 NOTE — Progress Notes (Signed)
Physical Therapy Treatment Patient Details Name: Elizabeth [name] MRN: [id number] DOB: 18-Dec-1955 Today's Date: 02/16/2018    History of Present Illness 62yo female with history of admission in December 2018 due to acute hypoxic respiratory failure due to COPD exacerbation. She was discharged and then sent to the ED from cardiac rehab on 02/08/18 due to increased dyspnea and hypoxia but left without being seen. She is now presenting to hte ED with ongoing increasing symptoms and intensity of DOE. PMH CAD, CHF, COPD, depression, gout, lupus, on home O2, pulmonary nodules, CVA     PT Comments    Pt transferred into standing and ambulated to the door and back..  Pt needed multiple cues to complete gait and continually went back into a flexed position on elbows leaning on the walker complaining of increased pain.  Educated patient that this was not safe.  Post gait training performed stretching.  Stretching seemed to help the pain and tightness on the right side.  Recommend continuing POC with an emphasis on gait and stretching to reduce pain and increase functional independence.  Pt inquired about and remains to benefit from a home health PT.  Pre tx 3L [address] at rest 88% increased to 4L [place] and SPO2 4L  93%-97%.  Follow Up Recommendations  Home health PT     Equipment Recommendations  Other (comment)    Recommendations for Other Services       Precautions / Restrictions Precautions Precautions: Other (comment) Precaution Comments: watch SpO2  Restrictions Weight Bearing Restrictions: No    Mobility  Bed Mobility Overal bed mobility: Modified Independent                Transfers Overall transfer level: Needs assistance Equipment used: Rolling walker (2 wheeled) Transfers: Sit to/from Stand Sit to Stand: Min guard         General transfer comment: Min guard for safety, VC and TC for safety   Ambulation/Gait Ambulation/Gait assistance: Min guard Ambulation Distance  (Feet): 20 Feet Assistive device: Rolling walker (2 wheeled) Gait Pattern/deviations: Step-through pattern;Decreased step length - right;Decreased step length - left;Decreased stride length;Decreased weight shift to right;Antalgic;Trunk flexed     General Gait Details: very antalgic pattern due to R flank pain, requires standing rest breaks due to DOE and pain; often gasps during activities due to pain with, standing improved HR   Stairs            Wheelchair Mobility    Modified Rankin (Stroke Patients Only)       Balance Overall balance assessment: Mild deficits observed, not formally tested                                          Cognition Arousal/Alertness: Awake/alert Behavior During Therapy: WFL for tasks assessed/performed Overall Cognitive Status: Within Functional Limits for tasks assessed                                        Exercises General Exercises - Lower Extremity Heel Slides: AROM;10 reps Other Exercises Other Exercises: Stretch/R Hamstrings/4 reps/15s Other Exercises: Stretch/IT Band/4 reps/15s    General Comments        Pertinent Vitals/Pain Pain Assessment: 0-10 Pain Score: 8  Pain Location: R flank with movement  Pain Descriptors / Indicators: Guarding;Grimacing;Sore Pain Intervention(s):  Limited activity within patient's tolerance;Repositioned    Home Living                      Prior Function            PT Goals (current goals can now be found in the care plan section) Acute Rehab PT Goals Patient Stated Goal: to reduce pain  PT Goal Formulation: With patient Time For Goal Achievement: 02/28/18 Potential to Achieve Goals: Good Progress towards PT goals: Progressing toward goals    Frequency    Min 3X/week      PT Plan Current plan remains appropriate    Co-evaluation              AM-PAC PT "6 Clicks" Daily Activity  Outcome Measure  Difficulty turning over in bed  (including adjusting bedclothes, sheets and blankets)?: A Lot Difficulty moving from lying on back to sitting on the side of the bed? : A Little Difficulty sitting down on and standing up from a chair with arms (e.g., wheelchair, bedside commode, etc,.)?: A Lot Help needed moving to and from a bed to chair (including a wheelchair)?: A Little Help needed walking in hospital room?: A Little   6 Click Score: 13    End of Session Equipment Utilized During Treatment: Oxygen Activity Tolerance: Patient limited by pain Patient left: in bed;with call bell/phone within reach Nurse Communication: Mobility status PT Visit Diagnosis: Muscle weakness (generalized) (M62.81);Difficulty in walking, not elsewhere classified (R26.2);Pain Pain - Right/Left: Right Pain - part of body: Hip     Time: 6219-4712 PT Time Calculation (min) (ACUTE ONLY): 37 min  Charges:  $Gait Training: 8-22 mins $Therapeutic Exercise: 8-22 mins                    G Codes:       Terri Skains, SPTA [phone]    Terri Skains 02/16/2018, 5:11 PM

## 2018-02-16 NOTE — Progress Notes (Signed)
Patient and sister at bedside gave discharged instructions, IV out , Tele off.

## 2018-02-17 ENCOUNTER — Telehealth: Payer: Self-pay | Admitting: Internal Medicine

## 2018-02-17 ENCOUNTER — Inpatient Hospital Stay (HOSPITAL_COMMUNITY): Payer: Medicare Other

## 2018-02-17 LAB — CYCLIC CITRUL PEPTIDE ANTIBODY, IGG/IGA: CCP Antibodies IgG/IgA: 6 units (ref 0–19)

## 2018-02-17 NOTE — Telephone Encounter (Signed)
Refill Request requires Prior Authorization Per [facility] they have faxed our office twice   Sildenafil 20 mg

## 2018-02-17 NOTE — Telephone Encounter (Signed)
Patient calling in requesting PA be done for sildenafil. Patient informed that this has already been forwarded to [place]. Hubbard Hartshorn, RN, BSN

## 2018-02-18 ENCOUNTER — Ambulatory Visit (HOSPITAL_COMMUNITY): Payer: Medicare Other

## 2018-02-18 LAB — MULTIPLE MYELOMA PANEL, SERUM
ALBUMIN/GLOB SERPL: 0.9 (ref 0.7–1.7)
Albumin SerPl Elph-Mcnc: 3.3 g/dL (ref 2.9–4.4)
Alpha 1: 0.3 g/dL (ref 0.0–0.4)
Alpha2 Glob SerPl Elph-Mcnc: 0.9 g/dL (ref 0.4–1.0)
B-GLOBULIN SERPL ELPH-MCNC: 1.6 g/dL — AB (ref 0.7–1.3)
Gamma Glob SerPl Elph-Mcnc: 1 g/dL (ref 0.4–1.8)
Globulin, Total: 3.8 g/dL (ref 2.2–3.9)
IGG (IMMUNOGLOBIN G), SERUM: 1226 mg/dL (ref 700–1600)
IGM (IMMUNOGLOBULIN M), SRM: 45 mg/dL (ref 26–217)
IgA: 573 mg/dL — ABNORMAL HIGH (ref 87–352)
Total Protein ELP: 7.1 g/dL (ref 6.0–8.5)

## 2018-02-18 MED FILL — Lidocaine HCl Local Inj 1%: INTRAMUSCULAR | Qty: 20 | Status: AC

## 2018-02-21 ENCOUNTER — Ambulatory Visit (HOSPITAL_COMMUNITY): Payer: Medicare Other

## 2018-02-22 ENCOUNTER — Telehealth: Payer: Self-pay | Admitting: *Deleted

## 2018-02-22 ENCOUNTER — Encounter: Payer: Self-pay | Admitting: Internal Medicine

## 2018-02-22 NOTE — Telephone Encounter (Signed)
Information was sent CoverMyMeds for PA for Sildenafil 20 mg tablets.  Approved 02/22/2018 thru 11/15/2018 . PA- [id number] Call to [facility] to notify them of the approval.  Sander Nephew, RN 02/22/2018 4:49 PM

## 2018-02-23 ENCOUNTER — Ambulatory Visit: Payer: Medicare Other

## 2018-02-23 ENCOUNTER — Telehealth: Payer: Self-pay

## 2018-02-23 ENCOUNTER — Ambulatory Visit (HOSPITAL_COMMUNITY): Payer: Medicare Other

## 2018-02-23 NOTE — Telephone Encounter (Signed)
Transition Care Management Follow-up Telephone Call   Date discharged? 02/16/2018   How have you been since you were released from the hospital? Well, but really tired. Not having an unusual amount of difficulty with breathing.   Do you understand why you were in the hospital? yes   Do you understand the discharge instructions? yes   Where were you discharged to? Home   Items Reviewed:  Medications reviewed: yes  Allergies reviewed: yes  Dietary changes reviewed: no  Referrals reviewed: no   Functional Questionnaire:  Activities of Daily Living (ADLs):   She states they are independent in the following: ambulation   Any transportation issues/concerns?: no   Any patient concerns? no   Confirmed importance and date/time of follow-up visits scheduled yes   Confirmed with patient if condition begins to worsen call PCP or go to the ER.  Patient was given the office number and encouraged to call back with question or concerns.  : yes  She also states that she is not having any difficulty now in obtaining her medication. Will be in clinic tomorrow and I encouraged her to ask questions if she has any.  Patterson Hammersmith PharmD PGY1 Pharmacy Practice Resident 02/23/2018 10:20 AM Pager: [phone]

## 2018-02-24 ENCOUNTER — Ambulatory Visit (INDEPENDENT_AMBULATORY_CARE_PROVIDER_SITE_OTHER): Payer: Medicare Other | Admitting: Internal Medicine

## 2018-02-24 ENCOUNTER — Other Ambulatory Visit: Payer: Self-pay

## 2018-02-24 VITALS — BP 98/73 | HR 76 | Temp 97.8°F | Ht 62.0 in | Wt 211.3 lb

## 2018-02-24 DIAGNOSIS — Z79899 Other long term (current) drug therapy: Secondary | ICD-10-CM

## 2018-02-24 DIAGNOSIS — Z95810 Presence of automatic (implantable) cardiac defibrillator: Secondary | ICD-10-CM

## 2018-02-24 DIAGNOSIS — M328 Other forms of systemic lupus erythematosus: Secondary | ICD-10-CM | POA: Diagnosis not present

## 2018-02-24 DIAGNOSIS — I2510 Atherosclerotic heart disease of native coronary artery without angina pectoris: Secondary | ICD-10-CM

## 2018-02-24 DIAGNOSIS — I2721 Secondary pulmonary arterial hypertension: Secondary | ICD-10-CM

## 2018-02-24 DIAGNOSIS — I5030 Unspecified diastolic (congestive) heart failure: Secondary | ICD-10-CM | POA: Diagnosis not present

## 2018-02-24 DIAGNOSIS — I110 Hypertensive heart disease with heart failure: Secondary | ICD-10-CM

## 2018-02-24 DIAGNOSIS — J9611 Chronic respiratory failure with hypoxia: Secondary | ICD-10-CM | POA: Diagnosis not present

## 2018-02-24 DIAGNOSIS — D890 Polyclonal hypergammaglobulinemia: Secondary | ICD-10-CM

## 2018-02-24 DIAGNOSIS — J449 Chronic obstructive pulmonary disease, unspecified: Secondary | ICD-10-CM

## 2018-02-24 DIAGNOSIS — Z9981 Dependence on supplemental oxygen: Secondary | ICD-10-CM | POA: Diagnosis not present

## 2018-02-24 DIAGNOSIS — G4733 Obstructive sleep apnea (adult) (pediatric): Secondary | ICD-10-CM

## 2018-02-24 NOTE — Progress Notes (Signed)
Dr. Hetty Ely,  Are you in the process of completing this letter?  Patient in office today and is inquiring about it.   Thanks,  Ander Purpura

## 2018-02-24 NOTE — Patient Instructions (Addendum)
It was good seeing you today. I'm glad you are doing OK since discharge.   Please continue taking your medications as prescribed. You have a follow-up appointment with your lung doctor in May.   I also need you to call and reschedule an appointment with your cardiologist as well!  I am going to check into your rheumatology referral and also check some labs on you today as well. Based on these, I might be referring you to a blood doctor.   Please see [place] again in 1 month!

## 2018-02-25 ENCOUNTER — Ambulatory Visit (HOSPITAL_COMMUNITY): Payer: Medicare Other

## 2018-02-25 ENCOUNTER — Telehealth: Payer: Self-pay | Admitting: Internal Medicine

## 2018-02-25 LAB — KAPPA/LAMBDA LIGHT CHAINS
Ig Kappa Free Light Chain: 44 mg/L — ABNORMAL HIGH (ref 3.3–19.4)
Ig Lambda Free Light Chain: 30.6 mg/L — ABNORMAL HIGH (ref 5.7–26.3)
Kappa/Lambda FluidC Ratio: 1.44 (ref 0.26–1.65)

## 2018-02-25 NOTE — Progress Notes (Signed)
   CC: HFU of dyspnea, wants letter  HPI:  [name] is a 62 y.o. F with medical history significant for chronic respiratory failure on supplemental oxygen, advanced pulmonary hypertension, COPD, OSA, CAD w/ ICM, HTN, history of +ANA and +anti-centromere antibodies who presents today for follow-up of her recent hospitalization. She also requests a letter for her lawyer excusing her from her upcoming court date for DUI due to her chronic medical conditions.   Brief hospital course: Admitted 3/29-4/1 after being sent from pulmonologist's office due to acute worsening of chronic dyspnea and concern for heart failure exacerbation. She was without evidence for significant volume overload and had minimal response to lasix. Cardiac MRI without evidence for amyloidosis. ECHO and RHC confirmed multifactorial pulmonary hypertension [(Group 1: primary PAH from SLE vs [place] synd), (Group 2: CAD, dCHF) and (Group 3: COPD, OSA)]. She was started on Sildenafil with return to baseline and was discharged with addition of Sildenafil 39m TID daily.   For details regarding today's visit and the status of their chronic medical issues, please refer to the assessment and plan.  Past Medical History:  Diagnosis Date  . CAD (coronary artery disease)    NSTEMI 08/2011:  LHC 08/21/11: mLAD 60-70%, pCFX occluded, dRCA chronic occlusion with L-R collats, EF 40-45%, inf AK.  PCI:  BMS to CFX.  .[place]F (congestive heart failure[place]  . COPD (chronic obstructive pulmonary disease) ([place] 11/17/[place]  . Depression   . Fibroids   . GERD (gastroesophageal reflux disease)   . Gout   . HLD (hyperlipidemia)    Chol = 235, LDL = 156 (08/2010)  . Hypertension   . Lupus ([place]   ANA + 10/2008, repeat ANA + (05/2009), on that visit 05/2009 following labs obtained RF <20, CRP <0.4, ANA titers 1:80,   . On home oxygen therapy    "2L; 24/7 (05/31/2017)  . Pulmonary nodules 05/2008   noted on CXR and CT  05/2008, repeat CT 10/2008 - Stable small bilateral pulmonary nodules measuring up to 6 m m  . Stroke ([facility] 04/2014   "mini stroke"; denies residual on 05/31/2017  . Toe pain, right 10/08/2017   Review of Systems:   General: Denies fevers, chills, headache HEENT: Denies acute changes in vision, dysphagia Cardiac: +SOB. Denies CP, palpitations Pulmonary: +DOE, +cough, +wheezes. Denies hemoptysis. Abd: Denies abdominal pain, changes in bowels Extremities: +joint pains. Denies worsened LE edema  Physical Exam: General: Alert, in no acute distress. Pleasant and conversant HEENT: No icterus, injection or ptosis. No hoarseness or dysarthria  Cardiac: RRR, no MGR appreciated Pulmonary: On [place]. Dyspneic during conversation but able to speak in full sentences. No wheezes or crackles.  Abd: Soft, non-tender. +bs Extremities: Warm, perfused. No rash.  Vitals:   02/24/18 1015  BP: 98/73  Pulse: 76  Temp: 97.8 F (36.6 C)  TempSrc: Oral  SpO2: 98%  Weight: 211 lb 4.8 oz (95.8 kg)  Height: _0  (1.575 m)   Body mass index is 38.65 kg/m.  Assessment & Plan:   See Encounters Tab for problem based charting.  Patient discussed with Dr. RRebeca Alert

## 2018-02-25 NOTE — Assessment & Plan Note (Addendum)
Assessment: Patient without change in respiratory status since hospital discharge however was unable to fill Sildenafil rx due to prior auth, which has now been approved. Her chronic respiratory failure is multifactorial and is attributed to pulmonary hypertension, COPD, CHF and OSA. She admits to compliance with her current regimen.   Plan: Instructed patient to pick-up Sildenafil Rx from pharmacy and begin this today. She was also encouraged to continue the remainder of her home medications. Patient has follow-up scheduled with her primary pulmonologist in 3 weeks and encouraged her to keep this appointment.

## 2018-02-25 NOTE — Assessment & Plan Note (Signed)
Assessment & Plan: PCP referred pt to rheumatology but pt decided not to go proceed with the scheduled appointment. We discussed today the importance of being evaluated by a specialist and she agreed to contact them and reschedule. I provided her with the contact information of the office.

## 2018-02-25 NOTE — Telephone Encounter (Signed)
Pt is requesting a call back about a letter that needed to be written.

## 2018-02-25 NOTE — Assessment & Plan Note (Addendum)
Assessment: Extensive work-up ordered during admission. 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 Kappa/lambda ratio upper end of normal. Serum B-globulin also increased at 1.6.   Plan: While she did have several abnormal results, she also has an underlying autoimmune process which could explain these elevations as well. Overall I think the next best step is for her to be seen by a rheumatologist.

## 2018-02-27 DIAGNOSIS — M329 Systemic lupus erythematosus, unspecified: Secondary | ICD-10-CM | POA: Diagnosis not present

## 2018-02-27 DIAGNOSIS — J449 Chronic obstructive pulmonary disease, unspecified: Secondary | ICD-10-CM | POA: Diagnosis not present

## 2018-02-27 NOTE — Telephone Encounter (Signed)
Thank you, Ms. Fifer is requesting a letter to provide to her lawyer so that she does not have to represent herself for a court case at the end of April. We spoke earlier this week and I wrote the letter that she requested detailing that she is on continuous oxygen and has poor pulmonary function. She had an office visit in the [facility] this week and felt that the letter was not what she needed when she looked over it. I think the best way to provide what she needs would be for her to request from her attorney that they provide through fax or mail a written letter detailing what is needed. Would you mind requesting this from Ms. S[place]?

## 2018-02-28 ENCOUNTER — Ambulatory Visit (HOSPITAL_COMMUNITY): Payer: Medicare Other

## 2018-02-28 ENCOUNTER — Encounter: Payer: Self-pay | Admitting: Internal Medicine

## 2018-03-02 ENCOUNTER — Ambulatory Visit (HOSPITAL_COMMUNITY): Payer: Medicare Other

## 2018-03-02 NOTE — Telephone Encounter (Signed)
I received the request in my mailbox and will provide the request to be scanned along with the letter to be sent.

## 2018-03-02 NOTE — Telephone Encounter (Signed)
Spoke with the patient.  Per the patient she had her [place] office mail it to our office on Friday 02/25/2018. We have not rec'd it as of yet.  Gave the patient our fax Number [phone] if they would like to fax it instead.  Once we rec'd it we will let you know.

## 2018-03-03 ENCOUNTER — Other Ambulatory Visit: Payer: Self-pay

## 2018-03-03 MED ORDER — METOPROLOL SUCCINATE ER 50 MG PO TB24: 50.0000 mg | ORAL_TABLET | Freq: Every day | ORAL | 0 refills | Status: AC

## 2018-03-04 ENCOUNTER — Ambulatory Visit (HOSPITAL_COMMUNITY): Payer: Medicare Other

## 2018-03-07 ENCOUNTER — Ambulatory Visit (HOSPITAL_COMMUNITY): Payer: Medicare Other

## 2018-03-08 ENCOUNTER — Encounter: Payer: Self-pay | Admitting: Internal Medicine

## 2018-03-08 ENCOUNTER — Ambulatory Visit (INDEPENDENT_AMBULATORY_CARE_PROVIDER_SITE_OTHER): Payer: Medicare Other | Admitting: Internal Medicine

## 2018-03-08 VITALS — BP 112/73 | HR 90 | Temp 98.0°F | Wt 209.8 lb

## 2018-03-08 DIAGNOSIS — Z7901 Long term (current) use of anticoagulants: Secondary | ICD-10-CM

## 2018-03-08 DIAGNOSIS — R918 Other nonspecific abnormal finding of lung field: Secondary | ICD-10-CM

## 2018-03-08 DIAGNOSIS — M797 Fibromyalgia: Secondary | ICD-10-CM | POA: Diagnosis not present

## 2018-03-08 DIAGNOSIS — I2722 Pulmonary hypertension due to left heart disease: Secondary | ICD-10-CM | POA: Diagnosis not present

## 2018-03-08 DIAGNOSIS — E785 Hyperlipidemia, unspecified: Secondary | ICD-10-CM | POA: Diagnosis not present

## 2018-03-08 DIAGNOSIS — F339 Major depressive disorder, recurrent, unspecified: Secondary | ICD-10-CM

## 2018-03-08 DIAGNOSIS — G4733 Obstructive sleep apnea (adult) (pediatric): Secondary | ICD-10-CM

## 2018-03-08 DIAGNOSIS — K219 Gastro-esophageal reflux disease without esophagitis: Secondary | ICD-10-CM

## 2018-03-08 DIAGNOSIS — I5042 Chronic combined systolic (congestive) and diastolic (congestive) heart failure: Secondary | ICD-10-CM

## 2018-03-08 DIAGNOSIS — R768 Other specified abnormal immunological findings in serum: Secondary | ICD-10-CM

## 2018-03-08 DIAGNOSIS — M189 Osteoarthritis of first carpometacarpal joint, unspecified: Secondary | ICD-10-CM | POA: Diagnosis not present

## 2018-03-08 DIAGNOSIS — M1A0710 Idiopathic chronic gout, right ankle and foot, without tophus (tophi): Secondary | ICD-10-CM | POA: Diagnosis not present

## 2018-03-08 DIAGNOSIS — J449 Chronic obstructive pulmonary disease, unspecified: Secondary | ICD-10-CM | POA: Diagnosis not present

## 2018-03-08 DIAGNOSIS — Z8739 Personal history of other diseases of the musculoskeletal system and connective tissue: Secondary | ICD-10-CM

## 2018-03-08 DIAGNOSIS — I110 Hypertensive heart disease with heart failure: Secondary | ICD-10-CM

## 2018-03-08 DIAGNOSIS — Z8673 Personal history of transient ischemic attack (TIA), and cerebral infarction without residual deficits: Secondary | ICD-10-CM

## 2018-03-08 DIAGNOSIS — J9611 Chronic respiratory failure with hypoxia: Secondary | ICD-10-CM

## 2018-03-08 DIAGNOSIS — Z951 Presence of aortocoronary bypass graft: Secondary | ICD-10-CM

## 2018-03-08 DIAGNOSIS — M109 Gout, unspecified: Secondary | ICD-10-CM | POA: Diagnosis not present

## 2018-03-08 DIAGNOSIS — Z79899 Other long term (current) drug therapy: Secondary | ICD-10-CM

## 2018-03-08 DIAGNOSIS — I480 Paroxysmal atrial fibrillation: Secondary | ICD-10-CM

## 2018-03-08 DIAGNOSIS — Z9981 Dependence on supplemental oxygen: Secondary | ICD-10-CM

## 2018-03-08 DIAGNOSIS — I2510 Atherosclerotic heart disease of native coronary artery without angina pectoris: Secondary | ICD-10-CM

## 2018-03-08 MED ORDER — DULOXETINE HCL 30 MG PO CPEP: 30.0000 mg | ORAL_CAPSULE | Freq: Every day | ORAL | 2 refills | Status: DC

## 2018-03-08 NOTE — Patient Instructions (Signed)
Thank you for coming to the clinic today. It was a pleasure to see you.   For your pain, try taking cymbalta. You will take one tablet of this daily then can increase to taking two tablets daily after the first week. Please attend the rheumatology appointment that you have scheduled next month and ask that they send their recommendations to our clinic.   For your lungs- continue to take the revatio and try to attend pulmonary rehab   We are having home health services set up for you - physical therapy and an aid   FOLLOW-UP INSTRUCTIONS When: 3-6 months with Dr. Hetty Ely  For: general follow up  What to bring: all of your medication bottles   Please call our clinic if you have any questions or concerns, we may be able to help and keep you from a long and expensive emergency room wait. Our clinic and after hours phone number is [phone], there is always someone available.

## 2018-03-08 NOTE — Progress Notes (Signed)
CC: pain everywhere   HPI:  Elizabeth [name] is a 62 y.o. with PMH advanced pulmonary hypertension, COPD, OSA, chronic respiratory failure on 2 L supplemental oxygen, CAD (s/p CABG x4 in 2017), paroxysmal atrial fibrillation (on eliquis), chronic diastolic heart failure, HTN, history of +ANA, GERD, HLD, depression, pulmonary nodules and CVA who presents for evaluation of widespread pain. Please see the assessment and plans for the status of the patient chronic medical problems.    Past Medical History:  Diagnosis Date  . CAD (coronary artery disease)    NSTEMI 08/2011:  LHC 08/21/11: mLAD 60-70%, pCFX occluded, dRCA chronic occlusion with L-R collats, EF 40-45%, inf AK.  PCI:  BMS to CFX.  Marland Kitchen CHF (congestive heart failure) ([place]) 05/31/2017  . COPD (chronic obstructive pulmonary disease) ([place]) 11/17/2016  . Depression   . Fibroids   . GERD (gastroesophageal reflux disease)   . Gout   . HLD (hyperlipidemia)    Chol = 235, LDL = 156 (08/2010)  . Hypertension   . Lupus ([place]) 2009   ANA + 10/2008, repeat ANA + (05/2009), on that visit 05/2009 following labs obtained RF <20, CRP <0.4, ANA titers 1:80,   . On home oxygen therapy    "2L; 24/7 (05/31/2017)  . Pulmonary nodules 05/2008   noted on CXR and CT 05/2008, repeat CT 10/2008 - Stable small bilateral pulmonary nodules measuring up to 6 m m  . Stroke [facility]) 04/2014   "mini stroke"; denies residual on 05/31/2017  . Toe pain, right 10/08/2017   Review of Systems:  Refer to history of present illness and assessment and plans for pertinent review of systems, all others reviewed and negative  Physical Exam:  Vitals:   03/08/18 1332  BP: 112/73  SpO2: 90%  Weight: 209 lb 12.8 oz (95.2 kg)   General: no acute distress  Cardiac: regular rate and rhythm, no murmur appreciated, no peripheral edema  Pulm: wearing nasal canula, normal work of breathing, speaking in full sentences, lung sounds are heard throughout and lungs are clear to  auscultation, no rhonchi, no wheezes  GI: abdomen is soft, non tender, non distended  MSK: no spinal or paraspinal muscle tenderness, no paraspinal muscle spasm, no synovitis in the hands, grip strength and upper extremity strength is equal and normal bilateral, testing of hip strength is limited by pain but she demonstrates that she is able to move both lower extremities when distracted. This exam was performed in the chair, further testing out of the chair was declined  Skin: no rashes over the exposed arms, there is a large patch of alopecia encompassing the caudal scalp when the wig is removed    Assessment & Plan:   Chronic respiratory failure secondary to  Chronic combined congestive heart failure  COPD Severe Pulmonary Hypertension  Most recent echo 3/30 with EF 01%, grade 1 diastolic dysfunction, reduced right ventricular systolic function and PA pressure 47 mmHg. Cardiac cath 4/2 showed a right ventricular pressure of 76 with PVR 7.2 WU and PCW 5. Last PFTs in 2017 showed moderate obstructive lung disease with response to bronchodilators and a severe diffusion defect. She was started on sildenafil 20 TID. Today she notes that her breathing feels " great" and that her symptoms are improved from the time of hospital admission. On exam today there are not signs of pulmonary or peripheral edema. Weight today is consistent with her weight of 96.4 kg at the time of most recent hospital discharge after diuresis.   -  SpO2 is 90% on 3 L [place] at rest today, she will need to continue oxygen therapy  - encouraged attendance of cardiac rehab  - continue revatio 20 mg TID  - continue trelegy, roflumilast and PRN duoneb - continue torsemide 60 mg qAM and 20 mg qPM, metoprolol succinate 50 mg qd and rosuvastatin  - encouraged completion of PFTs  Fibromyalgia  Ms. Glas describes pain extending from her neck down to her hips and through both arms and in her legs since the time of recent hospital  discharge. The pain has not limited her ability to ambulate and is not associated with numbness or tingling, chest pain, shortness of breath, rashes, or synovitis. She has a history of positive ANA 1:80 and anti centromere antibody, with associated alopecia areata. She has been diagnosed with fibromyalgia in the past and has not been on medication for neuropathic pain in the past, exam today is significant for >8 tender points and this widespread pain is associated with non restorative sleep.   -start cymbalta, I explained that this would not have an instant effect but would be the right medication for her pain which is most likely related to fibromyalgia.  - encouraged to attend rheumatology consult regarding positive ANA which is scheduled for next month   Gout  Symptoms began 8/18 when patient presented with bilateral lower extremity pain extending from the knees down to the legs not associated with erythema, warmth or swelling. At follow up 10/18 the pain had progressed to only involve the right foot and was not relieved by prednisone or naproxen and was found to have tenderness of the right first toe associated with darkening of the skin and a tender and swollen ankle. 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 At follow up one month she reported minimal improvement in the pain and had first MTP tenderness without signs of synovitis. In December she presented with right great toe pain and was found to have tenderness, swelling, warmth, and redness of the right first MTP and she was started on colchicine with 1.2 mg starting dose and tramadol.  On chronic colchicine PRN at this point but would benefit from urate lowering therapy. In February she presented with 3 days of bilateral foot pain associated with tenderness to palpation of the right midfoot associated with mild warm but no  swelling or erythema.  This has not been a typical gout course and this diagnosis is not proven however today she is not having toe pain and I believe it would be a good time to capture her uric acid level and start treating with urate lowering therapy in an attempt to lower her flair burden. The other possible explanation for this pain is erosive osteoarthritis which may be the explanation if she continues to have flairs despite reaching a goal serum urate level.  - uric acid is 13.4 today, will start allopurinol 100 mg qd  - continue colchicine 0.6 mg BID from now until at least 3 months after - I have noted that Ms. Taft is also on a statin medication which can have a higher risk of myopathy when taken in combination with colchicine however she has been on the combination consistently in the recent past and did not have any problem with myopathy  - return to clinic in 1 month for follow up of uric acid level, if uric acid remains elevated above 6 would  increase allopurinol to 200 mg daily  - I have called and updated Ms. Folse on the plan, she was able to teach back the instructions for colchicine and allopurinol and will follow up in one month   See Encounters Tab for problem based charting.  Patient discussed with Dr. Evette Doffing

## 2018-03-09 ENCOUNTER — Encounter: Payer: Self-pay | Admitting: Internal Medicine

## 2018-03-09 ENCOUNTER — Ambulatory Visit (HOSPITAL_COMMUNITY): Payer: Medicare Other

## 2018-03-09 ENCOUNTER — Other Ambulatory Visit: Payer: Self-pay | Admitting: Internal Medicine

## 2018-03-09 DIAGNOSIS — M109 Gout, unspecified: Secondary | ICD-10-CM | POA: Insufficient documentation

## 2018-03-09 LAB — URIC ACID: URIC ACID: 13.4 mg/dL — AB (ref 2.5–7.1)

## 2018-03-09 MED ORDER — COLCHICINE 0.6 MG PO TABS: 0.6000 mg | ORAL_TABLET | Freq: Two times a day (BID) | ORAL | 2 refills | Status: AC

## 2018-03-09 MED ORDER — ALLOPURINOL 100 MG PO TABS: 100.0000 mg | ORAL_TABLET | Freq: Every day | ORAL | 1 refills | Status: AC

## 2018-03-09 NOTE — Assessment & Plan Note (Signed)
Elizabeth [name] describes pain extending from her neck down to her hips and through both arms and in her legs since the time of recent hospital discharge. The pain has not limited her ability to ambulate and is not associated with numbness or tingling, chest pain, shortness of breath, rashes, or synovitis. She has a history of positive ANA 1:80 and anti centromere antibody, with associated alopecia areata. She has been diagnosed with fibromyalgia in the past and has not been on medication for neuropathic pain in the past, exam today is significant for >8 tender points and this widespread pain is associated with non restorative sleep.   -start cymbalta, I explained that this would not have an instant effect but would be the right medication for her pain which is most likely related to fibromyalgia.  - encouraged to attend rheumatology consult regarding positive ANA which is scheduled for next month

## 2018-03-09 NOTE — Addendum Note (Signed)
Addended by: Meryl Dare on: 03/09/2018 10:10 PM   Modules accepted: Orders

## 2018-03-09 NOTE — Assessment & Plan Note (Signed)
Symptoms began 8/18 when patient presented with bilateral lower extremity pain extending from the knees down to the legs not associated with erythema, warmth or swelling. At follow up 10/18 the pain had progressed to only involve the right foot and was not relieved by prednisone or naproxen and was found to have tenderness of the right first toe associated with darkening of the skin and a tender and swollen ankle. 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 At follow up one month she reported minimal improvement in the pain and had first MTP tenderness without signs of synovitis. In December she presented with right great toe pain and was found to have tenderness, swelling, warmth, and redness of the right first MTP and she was started on colchicine with 1.2 mg starting dose and tramadol.  On chronic colchicine PRN at this point but would benefit from urate lowering therapy. In February she presented with 3 days of bilateral foot pain associated with tenderness to palpation of the right midfoot associated with mild warm but no swelling or erythema.  This has not been a typical gout course and this diagnosis is not proven however today she is not having toe pain and I believe it would be a good time to capture her uric acid level and start treating with urate lowering therapy in an attempt to lower her flair burden. The other possible explanation for this pain is erosive osteoarthritis which may be the explanation if she continues to have flairs despite reaching a goal serum urate level.  - uric acid is 13.4 today, will start allopurinol 100 mg qd  - continue colchicine 0.6 mg BID from now until at least 3 months after - I have noted that Elizabeth [name] is also on a statin medication which can have a higher risk of myopathy when taken in combination with colchicine however she has been on the  combination consistently in the recent past and did not have any problem with myopathy  - return to clinic in 1 month for follow up of uric acid level, if uric acid remains elevated above 6 would increase allopurinol to 200 mg daily  - I have called and updated Elizabeth [name] on the plan, she was able to teach back the instructions for colchicine and allopurinol and will follow up in one month

## 2018-03-09 NOTE — Assessment & Plan Note (Signed)
Most recent echo 3/30 with EF 81%, grade 1 diastolic dysfunction, reduced right ventricular systolic function and PA pressure 47 mmHg. Cardiac cath 4/2 showed a right ventricular pressure of 76 with PVR 7.2 WU and PCW 5. Last PFTs in 2017 showed moderate obstructive lung disease with response to bronchodilators and a severe diffusion defect. She was started on sildenafil 20 TID. Today she notes that her breathing feels " great" and that her symptoms are improved from the time of hospital admission. On exam today there are not signs of pulmonary or peripheral edema. Weight today is consistent with her weight of 96.4 kg at the time of most recent hospital discharge after diuresis.   - SpO2 is 90% on 3 L  at rest today, she will need to continue oxygen therapy  - encouraged attendance of cardiac rehab  - continue revatio 20 mg TID  - continue trelegy, roflumilast and PRN duoneb - continue torsemide 60 mg qAM and 20 mg qPM, metoprolol succinate 50 mg qd and rosuvastatin  - encouraged completion of PFTs

## 2018-03-09 NOTE — Progress Notes (Signed)
Internal Medicine Clinic Attending  Case discussed with Dr. Blum at the time of the visit.  We reviewed the resident's history and exam and pertinent patient test results.  I agree with the assessment, diagnosis, and plan of care documented in the resident's note. 

## 2018-03-11 ENCOUNTER — Ambulatory Visit (HOSPITAL_COMMUNITY): Payer: Medicare Other

## 2018-03-14 ENCOUNTER — Ambulatory Visit (HOSPITAL_COMMUNITY): Payer: Medicare Other

## 2018-03-15 ENCOUNTER — Encounter: Payer: Medicare Other | Admitting: Internal Medicine

## 2018-03-15 ENCOUNTER — Telehealth: Payer: Self-pay | Admitting: *Deleted

## 2018-03-15 NOTE — Telephone Encounter (Signed)
Patient came to clinic today with niece. Reports the following side effects since starting allopurinol, colchicine and duloxetine on 03/09/2018: sleeping more, delayed responses when interacting with family members, loss of appetite, wakes with sweet taste in mouth, smell of food is off-putting, daily headaches (rates 9-10/10); taking 3 tabs tylenol 500 mg and resting to relieve h/a. Would like a return call from PCP to discuss. Patient may be reached at [phone] anytime. Of note patient has [facility] skilled nursing visit tomorrow. Patient will ask nurse to go over all meds to ensure she is taking everything correctly. Hubbard Hartshorn, RN, BSN

## 2018-03-16 ENCOUNTER — Ambulatory Visit (HOSPITAL_COMMUNITY): Payer: Medicare Other

## 2018-03-16 DIAGNOSIS — Z741 Need for assistance with personal care: Secondary | ICD-10-CM | POA: Diagnosis not present

## 2018-03-16 DIAGNOSIS — J9611 Chronic respiratory failure with hypoxia: Secondary | ICD-10-CM | POA: Diagnosis not present

## 2018-03-16 DIAGNOSIS — M109 Gout, unspecified: Secondary | ICD-10-CM | POA: Diagnosis not present

## 2018-03-16 DIAGNOSIS — M329 Systemic lupus erythematosus, unspecified: Secondary | ICD-10-CM | POA: Diagnosis not present

## 2018-03-16 DIAGNOSIS — I5032 Chronic diastolic (congestive) heart failure: Secondary | ICD-10-CM | POA: Diagnosis not present

## 2018-03-16 DIAGNOSIS — J449 Chronic obstructive pulmonary disease, unspecified: Secondary | ICD-10-CM | POA: Diagnosis not present

## 2018-03-16 DIAGNOSIS — M797 Fibromyalgia: Secondary | ICD-10-CM | POA: Diagnosis not present

## 2018-03-16 DIAGNOSIS — Z8673 Personal history of transient ischemic attack (TIA), and cerebral infarction without residual deficits: Secondary | ICD-10-CM | POA: Diagnosis not present

## 2018-03-16 DIAGNOSIS — I4891 Unspecified atrial fibrillation: Secondary | ICD-10-CM | POA: Diagnosis not present

## 2018-03-16 DIAGNOSIS — I10 Essential (primary) hypertension: Secondary | ICD-10-CM | POA: Diagnosis not present

## 2018-03-16 DIAGNOSIS — M6281 Muscle weakness (generalized): Secondary | ICD-10-CM | POA: Diagnosis not present

## 2018-03-16 DIAGNOSIS — G4733 Obstructive sleep apnea (adult) (pediatric): Secondary | ICD-10-CM | POA: Diagnosis not present

## 2018-03-16 DIAGNOSIS — Z9981 Dependence on supplemental oxygen: Secondary | ICD-10-CM | POA: Diagnosis not present

## 2018-03-16 IMAGING — CT CT ABD-PELV W/ CM
2 of 5 series · 16 of 46 positions shown, 18 images · IV contrast (iopamidol)
Comparison: CT abdomen and pelvis dated 08/08/2013 and chest CT
dated 11/24/2011

CLINICAL DATA: 51-year-old female Next with chest pain, upper
abdominal and epigastric pain, nausea vomiting.

EXAM:
CT ABDOMEN AND PELVIS WITH CONTRAST
TECHNIQUE: Multidetector CT imaging of the abdomen and pelvis was performed
using the standard protocol following bolus administration of
intravenous contrast.
CONTRAST:  100mL [id number] IOPAMIDOL ([id number]) INJECTION 61%

[Series 2: a/p w/ 5mm · axial · 0.68mm/px · z∈[-392,-7]mm · 13 of 87 slices shown, 15 images]
[im 5/87  soft-tissue]
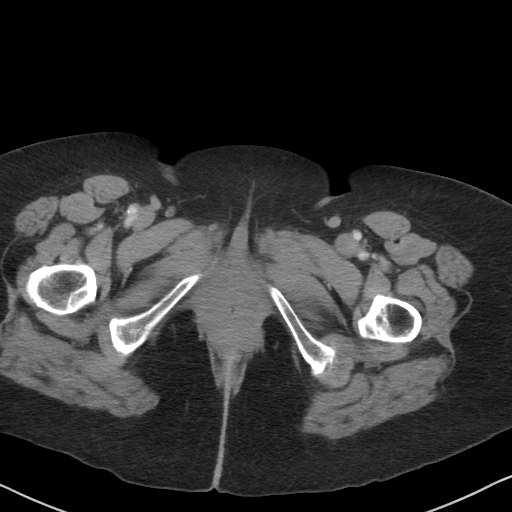
[im 5/87  bone]
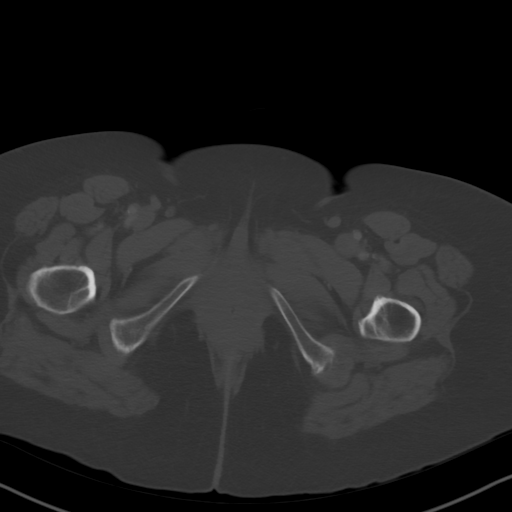
[im 14/87  soft-tissue]
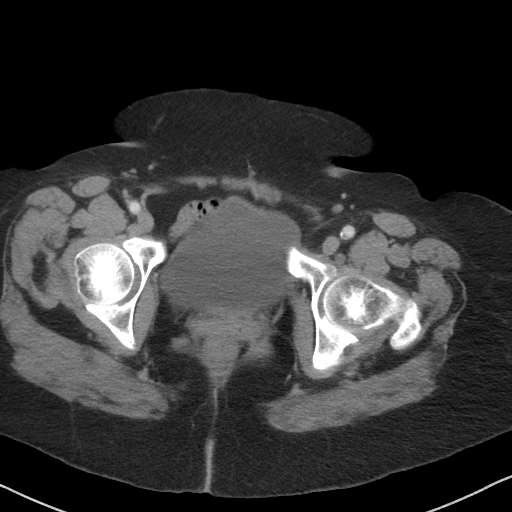
[im 19/87  soft-tissue]
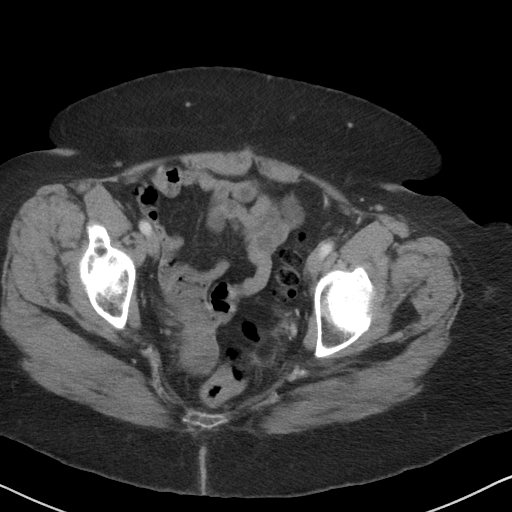
[im 23/87  soft-tissue]
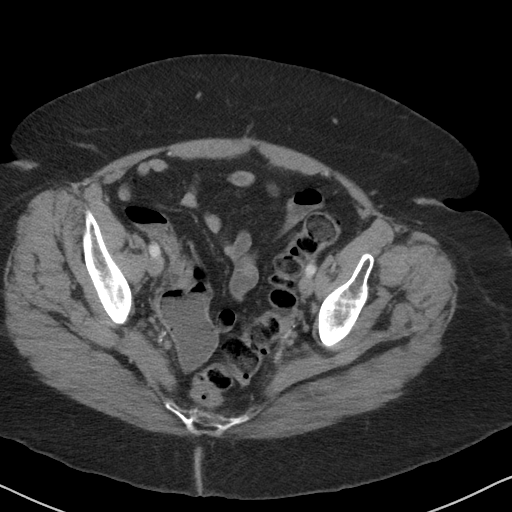
[im 32/87  soft-tissue]
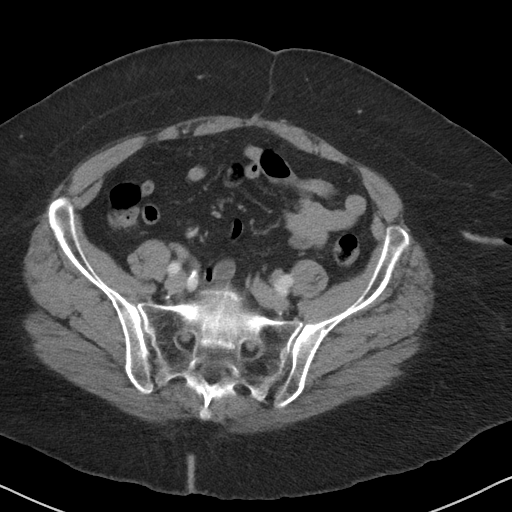
[im 37/87  soft-tissue]
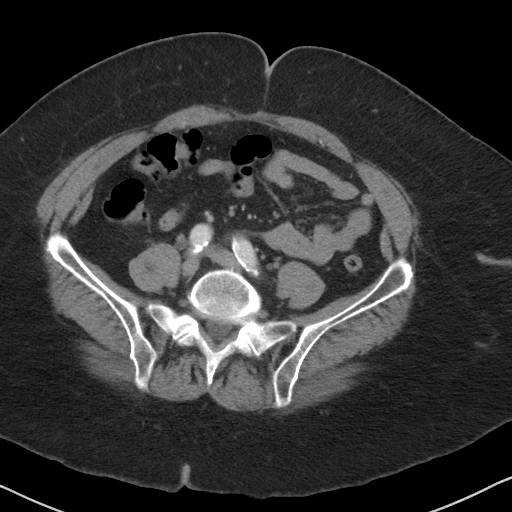
[im 46/87  soft-tissue]
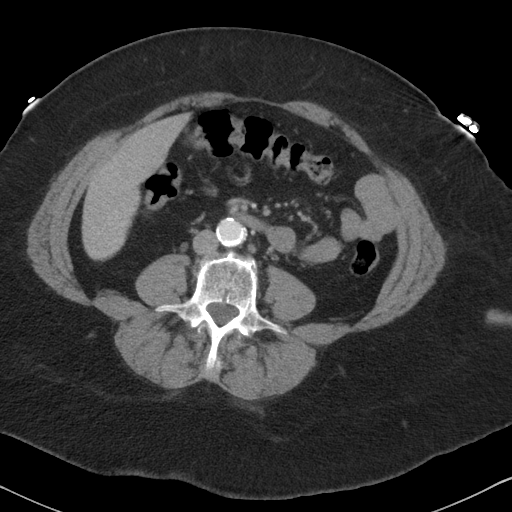
[im 50/87  soft-tissue]
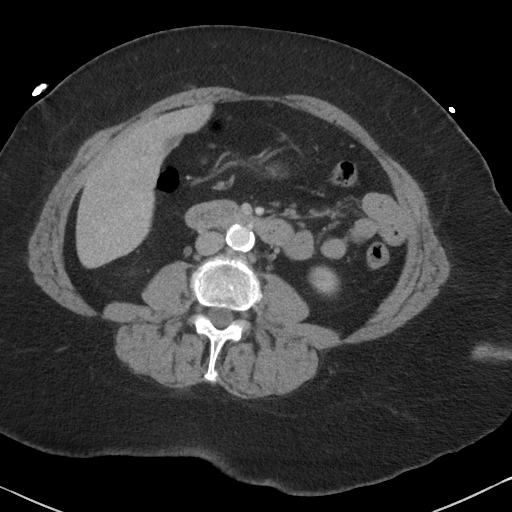
[im 55/87  soft-tissue]
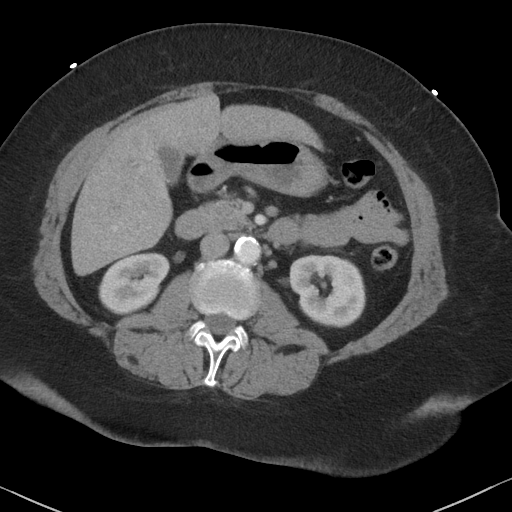
[im 55/87  bone]
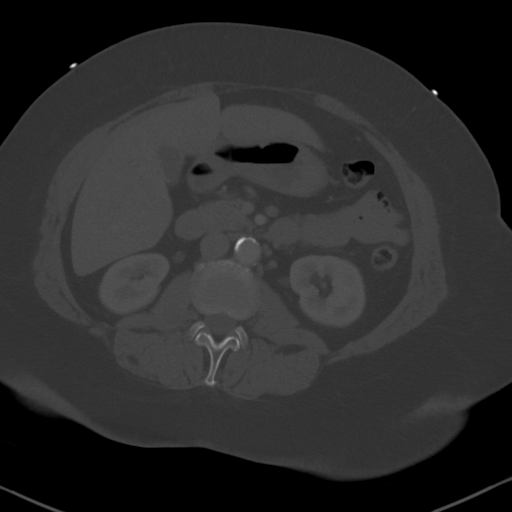
[im 64/87  soft-tissue]
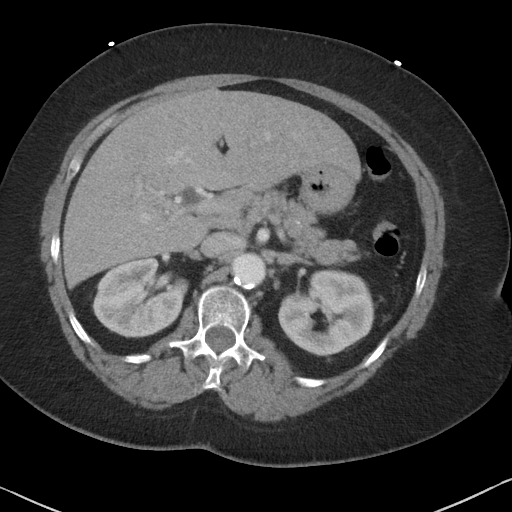
[im 68/87  soft-tissue]
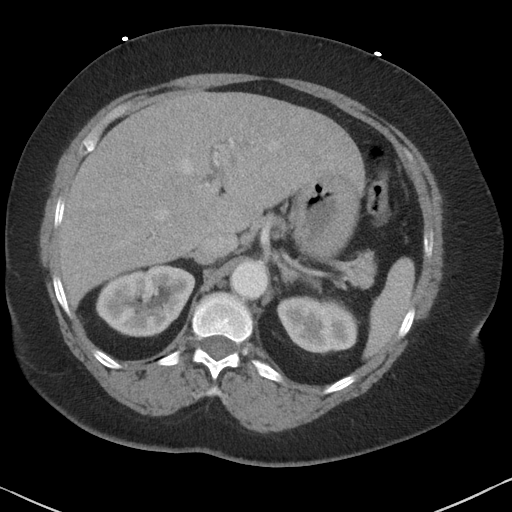
[im 73/87  soft-tissue]
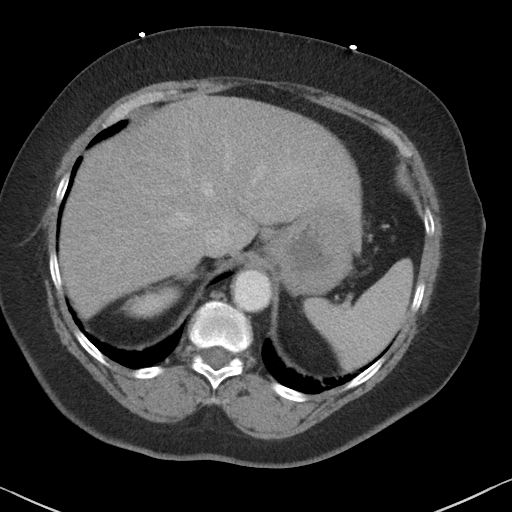
[im 82/87  soft-tissue]
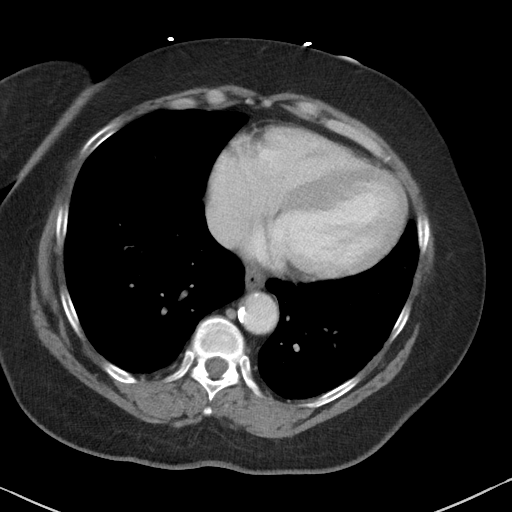

[Series 5: a/p w/ cor · coronal · 0.71mm/px · 3 of 130 slices shown]
[im 44/130  soft-tissue]
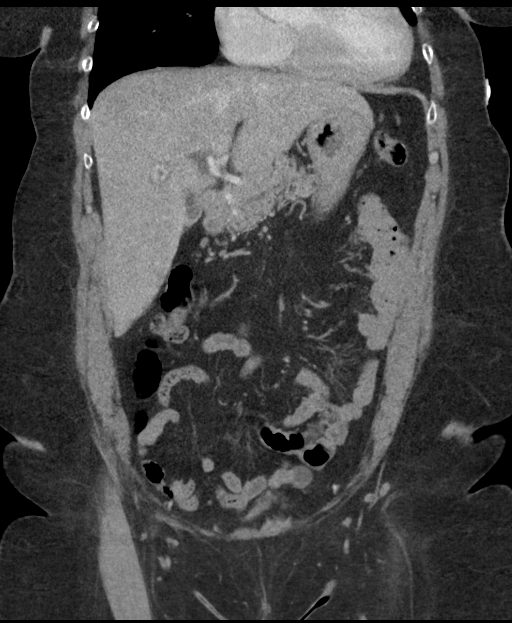
[im 58/130  soft-tissue]
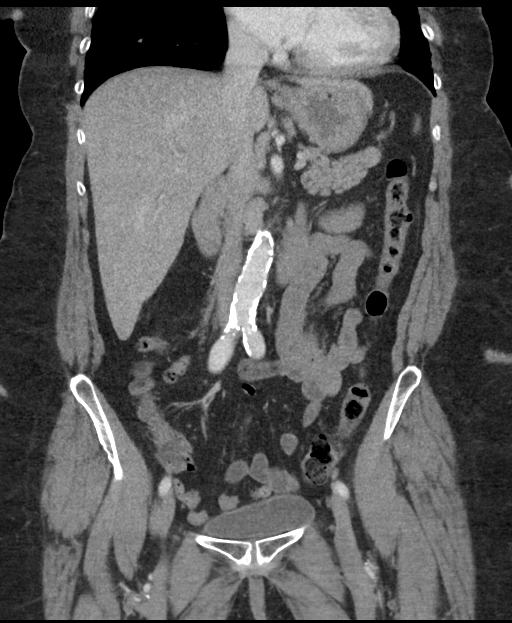
[im 72/130  soft-tissue]
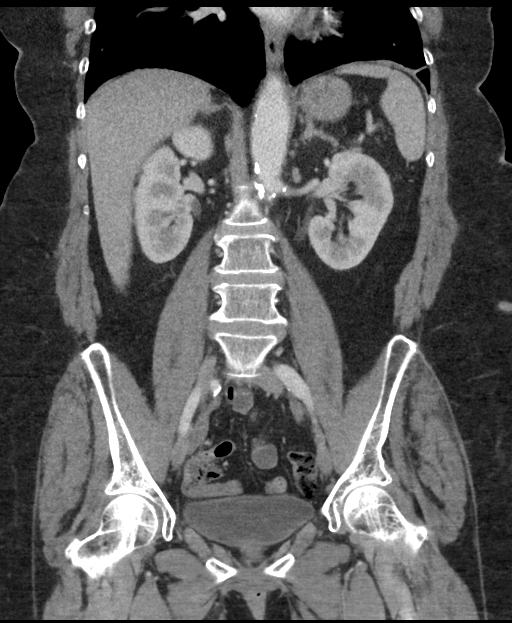

[16 of 46 positions shown; findings below may reference images not displayed]

FINDINGS: There are multiple small pulmonary nodules measuring up to 6 mm in
the right middle lobe which is similar to the study 11/24/2011. The
visualized lung bases are otherwise clear. No intra-abdominal free
air or free fluid.

Apparent diffuse fatty infiltration of the liver with mild coarsened
appearance of the liver parenchyma. The gallbladder, pancreas,
spleen, adrenal glands, kidneys, visualized ureters, and urinary
bladder appear unremarkable. Hysterectomy.

There is scattered colonic diverticula without active inflammatory
changes. There is no evidence of bowel obstruction or active
inflammation. Normal appendix.

There is advanced aortoiliac atherosclerotic disease. The origins of
the celiac axis, SMA, IMA as well as the origins of the renal
arteries remain patent. No portal venous gas identified. There is no
adenopathy. Midline vertical anterior pelvic wall incisional scar.
The abdominal wall soft tissues are otherwise unremarkable. There is
osteopenia with degenerative changes of the spine. No acute
fracture.
IMPRESSION: Heterogeneous liver with apparent fatty infiltration. Correlation
with LFTs recommended.

Scattered colonic diverticula. No evidence of bowel obstruction or
active inflammation. Normal appendix.

## 2018-03-16 NOTE — Telephone Encounter (Signed)
Attempted to reach patient to discuss directions from PCP. No answer. Message left on VM requesting return call. Hubbard Hartshorn, RN, BSN

## 2018-03-16 NOTE — Telephone Encounter (Signed)
I attempted to call Elizabeth [name] this morning but was not able to reach her and the phone went to unidentified voicemail. The symptoms of drowsiness and GI discomfort could all be related to the duloxetine, this has a higher risk of causing those symptoms than allopurinol and she has been on this colchicine in the past without displaying side effects. I would suggest that she try holding that medication first and continue the allopurinol with colchicine and call us back in two days to let us know how she is feeling. She should not stop taking the colchicine, stopping at this point could precipitate a gout attack.

## 2018-03-16 NOTE — Telephone Encounter (Signed)
Patient returned call. Has already taken today's dose of duloxetine. Aware to stop duloxetine and continue allopurinol and colchicine. Will call back Friday afternoon or Monday AM to let us know how she is doing. [place] RN set up meds in pill box today. Patient will remove duloxetine caps from pill box now. Hubbard Hartshorn, RN, BSN

## 2018-03-18 ENCOUNTER — Ambulatory Visit (HOSPITAL_COMMUNITY): Payer: Medicare Other

## 2018-03-18 DIAGNOSIS — R911 Solitary pulmonary nodule: Secondary | ICD-10-CM | POA: Diagnosis not present

## 2018-03-18 DIAGNOSIS — J449 Chronic obstructive pulmonary disease, unspecified: Secondary | ICD-10-CM | POA: Diagnosis not present

## 2018-03-21 NOTE — Progress Notes (Signed)
Internal Medicine Clinic Attending  Case discussed with Dr. Danford Bad  at the time of the visit.  We reviewed the resident's history and exam and pertinent patient test results.  I agree with the assessment, diagnosis, and plan of care documented in the resident's note.  Needs ongoing followup with pulmonology, also evaluation by rheumatology. Agree with Dr. 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   Oda Kilts, MD

## 2018-03-28 DIAGNOSIS — M1A09X1 Idiopathic chronic gout, multiple sites, with tophus (tophi): Secondary | ICD-10-CM | POA: Diagnosis not present

## 2018-03-29 ENCOUNTER — Ambulatory Visit: Payer: Medicare Other | Admitting: Physician Assistant

## 2018-03-29 DIAGNOSIS — J449 Chronic obstructive pulmonary disease, unspecified: Secondary | ICD-10-CM | POA: Diagnosis not present

## 2018-03-29 DIAGNOSIS — M329 Systemic lupus erythematosus, unspecified: Secondary | ICD-10-CM | POA: Diagnosis not present

## 2018-03-30 ENCOUNTER — Ambulatory Visit (INDEPENDENT_AMBULATORY_CARE_PROVIDER_SITE_OTHER): Payer: Medicare Other | Admitting: Pulmonary Disease

## 2018-03-30 ENCOUNTER — Encounter: Payer: Self-pay | Admitting: Pulmonary Disease

## 2018-03-30 VITALS — BP 138/88 | HR 93 | Ht 62.0 in | Wt 206.0 lb

## 2018-03-30 DIAGNOSIS — J449 Chronic obstructive pulmonary disease, unspecified: Secondary | ICD-10-CM | POA: Diagnosis not present

## 2018-03-30 DIAGNOSIS — I5032 Chronic diastolic (congestive) heart failure: Secondary | ICD-10-CM

## 2018-03-30 DIAGNOSIS — G4733 Obstructive sleep apnea (adult) (pediatric): Secondary | ICD-10-CM

## 2018-03-30 DIAGNOSIS — I272 Pulmonary hypertension, unspecified: Secondary | ICD-10-CM | POA: Diagnosis not present

## 2018-03-30 NOTE — Patient Instructions (Addendum)
Pulmonary hypertension: Keep follow-up with the advanced heart failure clinic  COPD with centrilobular emphysema: Continue Trelegy Get a flu shot in the fall   Obstructive sleep apnea: You need to start using CPAP: We will request this again from your DME company, I strongly recommend that you follow-up this time  Chronic respiratory failure with hypoxemia: Continue 2 L at rest, 3 L with exertion  Follow-up in 2 months with Dr. Vaughan Browner

## 2018-03-30 NOTE — Progress Notes (Signed)
Synopsis: Patient of Dr. Vaughan Browner with COPD, CHF, Lupus.  She has baseline systolic heart failure and concentric LVH.  She carries a diagnosis of COPD  Subjective:   PATIENT ID: Elizabeth [name] GENDER: female DOB: [birth year], MRN: [id number]   HPI  Chief Complaint  Patient presents with  . Follow-up    former PM, productive cough (brown), oxygen 2-3L    Elizabeth [name] is here to see me for her pulmonary hypertension.  She has a history of coronary artery disease and had a CABG in 2017.  She tells me that she isn't sure when she started having breathing difficulty.  She says that over the years she never had asthma.  She smoked cigarettes when she was young, and ended up smoking 3 cigarettes a day for years.  She stopped smoking in 2018.  She is not sure when she went on oxygen.    She was recently hospitalized, had some diuresis.   She sleeps with oxygen 2 L pm, and at rest 2Lpm, 3Lpm when she exerts herself.  She can't walk "too far" without getting short of breath.  She did pulmonary rehab briefly but hasn't been back since the hospitalization.   She says that beer works better than torsemide as a diuretic.  For this reason she drinks one beer every night.  She still has ankle swelling.  She says the torsemide doesn't work so well.  She isn't sure if she takes the sildenafil any more.   She continues to use and benefit from her oxygen.    She has lupus and notes significant hand pain.  She thinks that she has got.  She saw a rheumatologist this week on [address].    She says that she does'nt have a CPAP machine.  She still takes Trelegy nightly.   Past Medical History:  Diagnosis Date  . CAD (coronary artery disease)    NSTEMI 08/2011:  LHC 08/21/11: mLAD 60-70%, pCFX occluded, dRCA chronic occlusion with L-R collats, EF 40-45%, inf AK.  PCI:  BMS to CFX.  Marland Kitchen CHF (congestive heart failure) ([place]) 05/31/2017  . COPD (chronic obstructive pulmonary disease) ([place])  11/17/2016  . Depression   . Fibroids   . GERD (gastroesophageal reflux disease)   . Gout   . HLD (hyperlipidemia)    Chol = 235, LDL = 156 (08/2010)  . Hypertension   . Lupus ([place]) 2009   ANA + 10/2008, repeat ANA + (05/2009), on that visit 05/2009 following labs obtained RF <20, CRP <0.4, ANA titers 1:80,   . On home oxygen therapy    "2L; 24/7 (05/31/2017)  . Pulmonary nodules 05/2008   noted on CXR and CT 05/2008, repeat CT 10/2008 - Stable small bilateral pulmonary nodules measuring up to 6 m m  . Stroke [facility]) 04/2014   "mini stroke"; denies residual on 05/31/2017  . Toe pain, right 10/08/2017     Family History  Problem Relation Age of Onset  . Heart disease Mother   . Cervical cancer Sister   . Prostate cancer Brother   . Colon cancer Neg Hx      Social History   Socioeconomic History  . Marital status: Divorced    Spouse name: Not on file  . Number of children: Not on file  . Years of education: Not on file  . Highest education level: Not on file  Occupational History  . Occupation: Retired  Scientific laboratory technician  . Financial resource strain: Not on file  .  Food insecurity:    Worry: Not on file    Inability: Not on file  . Transportation needs:    Medical: Not on file    Non-medical: Not on file  Tobacco Use  . Smoking status: Former Smoker    Packs/day: 0.12    Years: 45.00    Pack years: 5.40    Types: Cigarettes    Last attempt to quit: 11/16/2016    Years since quitting: 1.3  . Smokeless tobacco: Never Used  Substance and Sexual Activity  . Alcohol use: Yes    Alcohol/week: 8.4 oz    Types: 7 Cans of beer, 7 Standard drinks or equivalent per week    Comment: 1 beer/day  . Drug use: No    Types: Cocaine    Comment: 05/31/2017 "nothing since ~ 2016"  . Sexual activity: Never  Lifestyle  . Physical activity:    Days per week: Not on file    Minutes per session: Not on file  . Stress: Not on file  Relationships  . Social connections:    Talks on phone:  Not on file    Gets together: Not on file    Attends religious service: Not on file    Active member of club or organization: Not on file    Attends meetings of clubs or organizations: Not on file    Relationship status: Not on file  . Intimate partner violence:    Fear of current or ex partner: Not on file    Emotionally abused: Not on file    Physically abused: Not on file    Forced sexual activity: Not on file  Other Topics Concern  . Not on file  Social History Narrative   Pt lives with sister and 2 nieces.     No Known Allergies   Outpatient Medications Prior to Visit  Medication Sig Dispense Refill  . acetaminophen (TYLENOL) 500 MG tablet Take 500 mg by mouth every 6 (six) hours as needed for mild pain.    Marland Kitchen albuterol (PROVENTIL HFA;VENTOLIN HFA) 108 (90 Base) MCG/ACT inhaler Inhale 2 puffs into the lungs every 6 (six) hours as needed for wheezing or shortness of breath. 1 Inhaler 2  . albuterol (PROVENTIL) (2.5 MG/3ML) 0.083% nebulizer solution Take 3 mLs (2.5 mg total) by nebulization every 6 (six) hours as needed for wheezing or shortness of breath. 120 vial 5  . allopurinol (ZYLOPRIM) 100 MG tablet Take 1 tablet (100 mg total) by mouth daily. 30 tablet 1  . apixaban (ELIQUIS) 5 MG TABS tablet Take 1 tablet (5 mg total) by mouth 2 (two) times daily. 60 tablet 4  . clopidogrel (PLAVIX) 75 MG tablet Take 1 tablet (75 mg total) daily by mouth. 30 tablet 11  . colchicine 0.6 MG tablet Take 1 tablet (0.6 mg total) by mouth 2 (two) times daily. 60 tablet 2  . fluticasone (FLONASE) 50 MCG/ACT nasal spray SHAKE LIQUID AND USE 2 SPRAYS IN EACH NOSTRIL DAILY (Patient taking differently: SHAKE LIQUID AND USE 2 SPRAYS IN EACH NOSTRIL DAILY AS NEEDED FOR CONGESTION) 48 g 1  . Fluticasone-Umeclidin-Vilant (TRELEGY ELLIPTA) 100-62.5-25 MCG/INH AEPB Inhale 1 puff into the lungs daily. 1 each 5  . ipratropium-albuterol (DUONEB) 0.5-2.5 (3) MG/3ML SOLN Take 3 mLs by nebulization every 6 (six)  hours as needed. (Patient taking differently: Take 3 mLs by nebulization every 6 (six) hours as needed (shortness of breath/wheezing). ) 360 mL 2  . metoprolol succinate (TOPROL-XL) 50 MG 24 hr tablet  Take 1 tablet (50 mg total) by mouth daily. 90 tablet 0  . nitroGLYCERIN (NITROSTAT) 0.4 MG SL tablet Place 1 tablet (0.4 mg total) under the tongue every 5 (five) minutes x 3 doses as needed for chest pain. 25 tablet 12  . OXYGEN Inhale 2-3 L into the lungs continuous.    . pantoprazole (PROTONIX) 40 MG tablet TAKE 1 TABLET BY MOUTH DAILY (Patient taking differently: TAKE 1 TABLET (40 MG) BY MOUTH DAILY) 90 tablet 3  . potassium chloride SA (K-DUR,KLOR-CON) 20 MEQ tablet Take 1 tablet (20 mEq total) by mouth daily. 30 tablet 4  . roflumilast (DALIRESP) 500 MCG TABS tablet Take 1 tablet (500 mcg total) by mouth daily. 30 tablet 11  . rosuvastatin (CRESTOR) 20 MG tablet Take 1 tablet (20 mg total) by mouth at bedtime. 90 tablet 3  . torsemide (DEMADEX) 20 MG tablet Take 3 tablets (60 mg total) by mouth daily. OK to take an extra tablet daily as needed for weight gain or edema. (Patient taking differently: Take 20-60 mg by mouth See admin instructions. Take 3 tablets (60 mg) by mouth every morning and 1 tablet (20 mg) at night) 190 tablet 6  . traZODone (DESYREL) 50 MG tablet Take 1 tablet (50 mg total) by mouth at bedtime. 90 tablet 3  . DULoxetine (CYMBALTA) 30 MG capsule Take 1 capsule (30 mg total) by mouth daily. (Patient not taking: Reported on 03/30/2018) 60 capsule 2   No facility-administered medications prior to visit.     Review of Systems  Constitutional: Negative for fever, malaise/fatigue and weight loss.  HENT: Negative for congestion, nosebleeds and sinus pain.   Respiratory: Positive for sputum production and wheezing. Negative for cough.   Cardiovascular: Positive for orthopnea and leg swelling. Negative for PND.  Skin: Negative for itching and rash.      Objective:  Physical  Exam   Vitals:   03/30/18 1423  BP: 138/88  Pulse: 93  SpO2: 97%  Weight: 206 lb (93.4 kg)  Height: 5' 2" (1.575 m)    Gen: chronically ill appearing HENT: OP clear, TM's clear, neck supple PULM: CTA B, normal percussion CV: RRR, no mgr, trace edema GI: BS+, soft, nontender Derm: ankle swelling noted Psyche: normal mood and affect   CBC    Component Value Date/Time   WBC 6.0 02/12/2018 0227   RBC 5.17 (H) 02/12/2018 0227   HGB 14.3 02/12/2018 0227   HGB 13.1 03/11/2016 1034   HCT 45.1 02/12/2018 0227   HCT 40.8 03/11/2016 1034   PLT 230 02/12/2018 0227   PLT 268 03/11/2016 1034   MCV 87.2 02/12/2018 0227   MCV 85 03/11/2016 1034   MCH 27.7 02/12/2018 0227   MCHC 31.7 02/12/2018 0227   RDW 18.3 (H) 02/12/2018 0227   RDW 15.5 (H) 03/11/2016 1034   LYMPHSABS 1.9 02/11/2018 1131   MONOABS 0.7 02/11/2018 1131   EOSABS 0.0 02/11/2018 1131   BASOSABS 0.0 02/11/2018 1131     Chest imaging: March 2019 CT chest images independently reviewed showing mild centrilobular emphysema some pulmonary nodules, no other pulmonary parenchymal abnormal  PFT: July 2017 ratio 62%, FEV1 1.36 L 69% predicted 12% change but only 150 cc change with bronchodilator, total lung capacity 3.75 L, 78% predicted, DLCO 9.6 mL 44% predicted  Labs:  Path:  Echo: March 2019 LVEF 45% with grade 1 diastolic dysfunction moderate concentric hypertrophy PA pressure 47, RV severely dilated  Heart Catheterization:  RHC 4/19: RA = 10 RV =  76/10 PA = 71/33 (45) PCW = 5 Fick cardiac output/index = 5.0/2.5 Thermo CO/CI = 5.6/2.8 PVR = 7.2 WU FA sat = 90% PA sat = 59%, 66% Assessment: 1. Moderate pulmonary HTN with normal wedge pressure after diuresis 2. Markedly elevated PVR  Hospitalization records from April 2019 reviewed, she was followed by the advanced heart failure team.  Right heart catheterization showed a elevated PA pressure with pulmonary vascular resistance of 7.2 [place] units,  normal wedge pressure.  Treated with sildenafil 23 times a day.     Assessment & Plan:   Obstructive sleep apnea - Plan: Ambulatory Referral for DME  COPD, group D, by GOLD 2017 classification ([place])  Pulmonary hypertension ([place])  Chronic diastolic CHF (congestive heart failure) ([place])  Discussion: Elizabeth [name] has [employer] [facility] group 3 pulmonary hypertension secondary to COPD, centrilobular emphysema, untreated pulmonary hypertension.  She has chronic respiratory failure with hypoxemia secondary to the same.  The pulmonary hypertension due to her lung disease is currently being managed with pulmonary vasodilators by the cardiology clinic.  She remains noncompliant with CPAP.  Today she asked me about a lung transplant.  I explained to her that as the predominant problem here is centrilobular emphysema it is not really clear that a lung transplant would be helpful.  There has really never been clear direction as to how to handle patients with advanced COPD with lung transplant.  Certainly there may be some degree of pulmonary arteriopathy in addition to what is caused by her pulmonary hypertension.  However, it is difficult to tease this out with her noncompliance issues.  Of note, she was recently smoking as of a year ago.  Plan: Pulmonary hypertension: Keep follow-up with the advanced heart failure clinic  COPD with centrilobular emphysema: Continue Trelegy Get a flu shot in the fall   Obstructive sleep apnea: You need to start using CPAP: We will request this again from your DME company, I strongly recommend that you follow-up this time  Chronic respiratory failure with hypoxemia: Continue 2 L at rest, 3 L with exertion  Follow-up in 2 months with Dr. Vaughan Browner  > 50% of this 40 minute visit spent face to face   Current Outpatient Medications:  .  acetaminophen (TYLENOL) 500 MG tablet, Take 500 mg by mouth every 6 (six) hours as needed for mild pain., Disp: , Rfl:  .   albuterol (PROVENTIL HFA;VENTOLIN HFA) 108 (90 Base) MCG/ACT inhaler, Inhale 2 puffs into the lungs every 6 (six) hours as needed for wheezing or shortness of breath., Disp: 1 Inhaler, Rfl: 2 .  albuterol (PROVENTIL) (2.5 MG/3ML) 0.083% nebulizer solution, Take 3 mLs (2.5 mg total) by nebulization every 6 (six) hours as needed for wheezing or shortness of breath., Disp: 120 vial, Rfl: 5 .  allopurinol (ZYLOPRIM) 100 MG tablet, Take 1 tablet (100 mg total) by mouth daily., Disp: 30 tablet, Rfl: 1 .  apixaban (ELIQUIS) 5 MG TABS tablet, Take 1 tablet (5 mg total) by mouth 2 (two) times daily., Disp: 60 tablet, Rfl: 4 .  clopidogrel (PLAVIX) 75 MG tablet, Take 1 tablet (75 mg total) daily by mouth., Disp: 30 tablet, Rfl: 11 .  colchicine 0.6 MG tablet, Take 1 tablet (0.6 mg total) by mouth 2 (two) times daily., Disp: 60 tablet, Rfl: 2 .  fluticasone (FLONASE) 50 MCG/ACT nasal spray, SHAKE LIQUID AND USE 2 SPRAYS IN EACH NOSTRIL DAILY (Patient taking differently: SHAKE LIQUID AND USE 2 SPRAYS IN EACH NOSTRIL DAILY AS NEEDED FOR  CONGESTION), Disp: 48 g, Rfl: 1 .  Fluticasone-Umeclidin-Vilant (TRELEGY ELLIPTA) 100-62.5-25 MCG/INH AEPB, Inhale 1 puff into the lungs daily., Disp: 1 each, Rfl: 5 .  ipratropium-albuterol (DUONEB) 0.5-2.5 (3) MG/3ML SOLN, Take 3 mLs by nebulization every 6 (six) hours as needed. (Patient taking differently: Take 3 mLs by nebulization every 6 (six) hours as needed (shortness of breath/wheezing). ), Disp: 360 mL, Rfl: 2 .  metoprolol succinate (TOPROL-XL) 50 MG 24 hr tablet, Take 1 tablet (50 mg total) by mouth daily., Disp: 90 tablet, Rfl: 0 .  nitroGLYCERIN (NITROSTAT) 0.4 MG SL tablet, Place 1 tablet (0.4 mg total) under the tongue every 5 (five) minutes x 3 doses as needed for chest pain., Disp: 25 tablet, Rfl: 12 .  OXYGEN, Inhale 2-3 L into the lungs continuous., Disp: , Rfl:  .  pantoprazole (PROTONIX) 40 MG tablet, TAKE 1 TABLET BY MOUTH DAILY (Patient taking differently:  TAKE 1 TABLET (40 MG) BY MOUTH DAILY), Disp: 90 tablet, Rfl: 3 .  potassium chloride SA (K-DUR,KLOR-CON) 20 MEQ tablet, Take 1 tablet (20 mEq total) by mouth daily., Disp: 30 tablet, Rfl: 4 .  roflumilast (DALIRESP) 500 MCG TABS tablet, Take 1 tablet (500 mcg total) by mouth daily., Disp: 30 tablet, Rfl: 11 .  rosuvastatin (CRESTOR) 20 MG tablet, Take 1 tablet (20 mg total) by mouth at bedtime., Disp: 90 tablet, Rfl: 3 .  torsemide (DEMADEX) 20 MG tablet, Take 3 tablets (60 mg total) by mouth daily. OK to take an extra tablet daily as needed for weight gain or edema. (Patient taking differently: Take 20-60 mg by mouth See admin instructions. Take 3 tablets (60 mg) by mouth every morning and 1 tablet (20 mg) at night), Disp: 190 tablet, Rfl: 6 .  traZODone (DESYREL) 50 MG tablet, Take 1 tablet (50 mg total) by mouth at bedtime., Disp: 90 tablet, Rfl: 3

## 2018-04-01 DIAGNOSIS — I5032 Chronic diastolic (congestive) heart failure: Secondary | ICD-10-CM | POA: Diagnosis not present

## 2018-04-01 DIAGNOSIS — Z741 Need for assistance with personal care: Secondary | ICD-10-CM | POA: Diagnosis not present

## 2018-04-01 DIAGNOSIS — G4733 Obstructive sleep apnea (adult) (pediatric): Secondary | ICD-10-CM | POA: Diagnosis not present

## 2018-04-01 DIAGNOSIS — J449 Chronic obstructive pulmonary disease, unspecified: Secondary | ICD-10-CM | POA: Diagnosis not present

## 2018-04-01 DIAGNOSIS — Z9981 Dependence on supplemental oxygen: Secondary | ICD-10-CM | POA: Diagnosis not present

## 2018-04-01 DIAGNOSIS — M109 Gout, unspecified: Secondary | ICD-10-CM | POA: Diagnosis not present

## 2018-04-01 DIAGNOSIS — J9611 Chronic respiratory failure with hypoxia: Secondary | ICD-10-CM | POA: Diagnosis not present

## 2018-04-01 DIAGNOSIS — I10 Essential (primary) hypertension: Secondary | ICD-10-CM | POA: Diagnosis not present

## 2018-04-01 DIAGNOSIS — Z8673 Personal history of transient ischemic attack (TIA), and cerebral infarction without residual deficits: Secondary | ICD-10-CM | POA: Diagnosis not present

## 2018-04-01 DIAGNOSIS — M6281 Muscle weakness (generalized): Secondary | ICD-10-CM | POA: Diagnosis not present

## 2018-04-01 DIAGNOSIS — M797 Fibromyalgia: Secondary | ICD-10-CM | POA: Diagnosis not present

## 2018-04-01 DIAGNOSIS — I4891 Unspecified atrial fibrillation: Secondary | ICD-10-CM | POA: Diagnosis not present

## 2018-04-01 DIAGNOSIS — M329 Systemic lupus erythematosus, unspecified: Secondary | ICD-10-CM | POA: Diagnosis not present

## 2018-04-13 DIAGNOSIS — M2550 Pain in unspecified joint: Secondary | ICD-10-CM | POA: Diagnosis not present

## 2018-04-13 DIAGNOSIS — M1A09X1 Idiopathic chronic gout, multiple sites, with tophus (tophi): Secondary | ICD-10-CM | POA: Diagnosis not present

## 2018-04-18 DIAGNOSIS — R911 Solitary pulmonary nodule: Secondary | ICD-10-CM | POA: Diagnosis not present

## 2018-04-18 DIAGNOSIS — J449 Chronic obstructive pulmonary disease, unspecified: Secondary | ICD-10-CM | POA: Diagnosis not present

## 2018-04-19 ENCOUNTER — Telehealth: Payer: Self-pay | Admitting: Pulmonary Disease

## 2018-04-19 NOTE — Telephone Encounter (Signed)
Spoke with pt. States that she would like to have a POC ordered. Advised pt that she would need a walk test and OV due to her insurance. Pt is going to speak with her sister, who provides her transportation and we back with [place].

## 2018-04-19 NOTE — Telephone Encounter (Signed)
Pt called back and spoke with Danielle. She has been scheduled to see Dr. Vaughan Browner on 05/12/18 at 3:15pm. Nothing further was needed.

## 2018-04-21 ENCOUNTER — Encounter: Payer: Self-pay | Admitting: Physician Assistant

## 2018-04-21 ENCOUNTER — Ambulatory Visit (INDEPENDENT_AMBULATORY_CARE_PROVIDER_SITE_OTHER): Payer: Medicare Other | Admitting: Physician Assistant

## 2018-04-21 VITALS — BP 116/68 | HR 94 | Ht 62.0 in | Wt 205.0 lb

## 2018-04-21 DIAGNOSIS — I5043 Acute on chronic combined systolic (congestive) and diastolic (congestive) heart failure: Secondary | ICD-10-CM | POA: Diagnosis not present

## 2018-04-21 DIAGNOSIS — I5032 Chronic diastolic (congestive) heart failure: Secondary | ICD-10-CM

## 2018-04-21 DIAGNOSIS — Z9861 Coronary angioplasty status: Secondary | ICD-10-CM | POA: Diagnosis not present

## 2018-04-21 DIAGNOSIS — I2510 Atherosclerotic heart disease of native coronary artery without angina pectoris: Secondary | ICD-10-CM | POA: Diagnosis not present

## 2018-04-21 DIAGNOSIS — I2721 Secondary pulmonary arterial hypertension: Secondary | ICD-10-CM | POA: Diagnosis not present

## 2018-04-21 LAB — BASIC METABOLIC PANEL
BUN/Creatinine Ratio: 16 (ref 12–28)
BUN: 13 mg/dL (ref 8–27)
CALCIUM: 9.7 mg/dL (ref 8.7–10.3)
CHLORIDE: 92 mmol/L — AB (ref 96–106)
CO2: 27 mmol/L (ref 20–29)
Creatinine, Ser: 0.81 mg/dL (ref 0.57–1.00)
GFR calc Af Amer: 91 mL/min/{1.73_m2} (ref >59)
GFR, EST NON AFRICAN AMERICAN: 79 mL/min/{1.73_m2} (ref >59)
GLUCOSE: 90 mg/dL (ref 65–99)
POTASSIUM: 3.2 mmol/L — AB (ref 3.5–5.2)
SODIUM: 138 mmol/L (ref 134–144)

## 2018-04-21 MED ORDER — SILDENAFIL CITRATE 20 MG PO TABS: 10.0000 mg | ORAL_TABLET | Freq: Three times a day (TID) | ORAL | 6 refills | Status: AC

## 2018-04-21 NOTE — Patient Instructions (Addendum)
Medication Instructions:   RESTART SILDENAFIL AT 10 MG THREE TIMES DAILY=1/2 OF THE 20 MG TABLET THREE TIMES DAILY  Labwork:  Your physician recommends that you HAVE LAB WORK TODAY  Follow-Up:  Your physician recommends that you schedule a follow-up appointment in: 6 MONTHS WITH DR CROITORU   LIMIT SODIUM TO 500 MG WITH EVERY MEAL  LIMIT FLUID INTAKE TO 1 TO 2 QUARTS DAILY

## 2018-04-21 NOTE — Progress Notes (Signed)
Cardiology Office Note   Date:  04/21/2018   ID:  Elizabeth [name], [name] 04-19-1956, MRN [id number]  PCP:  Ledell Noss, MD  Cardiologist: Dr. Juanetta Snow, PA-C   Chief Complaint  Patient presents with  . Follow-up    discuss taking lasix.   patient feeling cold all of the time    History of Present Illness: Elizabeth [name] is a 62 y.o. female with a history of CABG 05/2016 w/ LIMA-LAD, SVG-OM-CFX, SVG-RCA, CHF, COPD on home oxygen, hyperlipidemia, hypertension, fibromyalgia, GERD, PAF formerly on amiodarone now on Eliquis, remote cocaine useand lupus  03/09/2018, patient has been started on sildenafil 20 mg 3 times daily and symptoms were significantly improved.  Weight 96.4 kg, O2 sats 90% on 3 L, continue trelegy, roflumilast, PRN duoneb, torsemide 60 mg qAM and 20 mg qPM, metoprolol succinate 50 mg qd and rosuvastatin   Elizabeth [name] presents for cardiology follow up.  She has not been taking the sildenafil, it causes nausea. Is willing to try 1/2 tablet.   She is generally breathing at baseline. She does not weigh daily, but can. She only wakes w/ SOB in the night if her O2 comes off. However, she wakes every morning 2-3 am. She cannot get back to sleep. Does not think she wakes from SOB. Says she feels cold a lot of the time. Compliant w/ torsemide bid dosing.   She is off the antidepressant, feels better off it.   Her R foot is still swollen, however, she has seen the rheumatologist and feels she is improving.   No chest pain with exertion. Is trying to increase her exertion. Was able to take care of 2 grandchildren for a while this week. It tired her out, but she enjoyed it.    Past Medical History:  Diagnosis Date  . CAD (coronary artery disease)    NSTEMI 08/2011:  LHC 08/21/11: mLAD 60-70%, pCFX occluded, dRCA chronic occlusion with L-R collats, EF 40-45%, inf AK.  PCI:  BMS to CFX.  Marland Kitchen CHF (congestive heart failure) ([place]) 05/31/2017  . COPD  (chronic obstructive pulmonary disease) ([place]) 11/17/2016  . Depression   . Fibroids   . GERD (gastroesophageal reflux disease)   . Gout   . HLD (hyperlipidemia)    Chol = 235, LDL = 156 (08/2010)  . Hypertension   . Lupus ([place]) 2009   ANA + 10/2008, repeat ANA + (05/2009), on that visit 05/2009 following labs obtained RF <20, CRP <0.4, ANA titers 1:80,   . On home oxygen therapy    "2L; 24/7 (05/31/2017)  . Pulmonary nodules 05/2008   noted on CXR and CT 05/2008, repeat CT 10/2008 - Stable small bilateral pulmonary nodules measuring up to 6 m m  . Stroke [facility]) 04/2014   "mini stroke"; denies residual on 05/31/2017  . Toe pain, right 10/08/2017    Past Surgical History:  Procedure Laterality Date  . CARDIAC CATHETERIZATION N/A 06/02/2016   Procedure: Left Heart Cath and Coronary Angiography;  Surgeon: Belva Crome, MD;  Location: [place] [facility];  Service: Cardiovascular;  Laterality: N/A;  . COLONOSCOPY N/A 07/14/2013   Procedure: COLONOSCOPY;  Surgeon: Beryle Beams, MD;  Location: [facility];  Service: Endoscopy;  Laterality: N/A;  . CORONARY ANGIOPLASTY WITH STENT PLACEMENT    . CORONARY ARTERY BYPASS GRAFT N/A 06/08/2016   Procedure: CORONARY ARTERY BYPASS GRAFTING (CABG) X 4 Diagnostic Diagram (LIMA-LAD, SVG-OM-CFX, SVG-RCA) With Right Greater Saphenous vein endoscopic harvesting;  Surgeon: [name], MD;  Location: [place];  Service: Open Heart Surgery;  Laterality: N/A;  . CORONARY STENT INTERVENTION N/A 09/22/2017   Procedure: CORONARY STENT INTERVENTION;  Surgeon: Martinique, Peter M, MD;  Location: [place] [facility];  Service: Cardiovascular;  Laterality: N/A;  . FRACTURE SURGERY    . ORIF TIBIA PLATEAU Left 08/22/2013   Procedure: OPEN REDUCTION INTERNAL FIXATION (ORIF) TIBIAL PLATEAU;  Surgeon: Renette Butters, MD;  Location: [place];  Service: Orthopedics;  Laterality: Left;  . RIGHT HEART CATH N/A 02/15/2018   Procedure: RIGHT HEART CATH;  Surgeon: Jolaine Artist,  MD;  Location: [place] [facility];  Service: Cardiovascular;  Laterality: N/A;  . RIGHT/LEFT HEART CATH AND CORONARY/GRAFT ANGIOGRAPHY N/A 09/20/2017   Procedure: RIGHT/LEFT HEART CATH AND CORONARY/GRAFT ANGIOGRAPHY;  Surgeon: Leonie Man, MD;  Location: [place] [facility];  Service: Cardiovascular;  Laterality: N/A;  . TEE WITHOUT CARDIOVERSION N/A 06/08/2016   Procedure: TRANSESOPHAGEAL ECHOCARDIOGRAM (TEE);  Surgeon: [name], MD;  Location: [place];  Service: Open Heart Surgery;  Laterality: N/A;  . TUBAL LIGATION    . VAGINAL HYSTERECTOMY  05/2003    Current Outpatient Medications  Medication Sig Dispense Refill  . acetaminophen (TYLENOL) 500 MG tablet Take 500 mg by mouth every 6 (six) hours as needed for mild pain.    Marland Kitchen albuterol (PROVENTIL HFA;VENTOLIN HFA) 108 (90 Base) MCG/ACT inhaler Inhale 2 puffs into the lungs every 6 (six) hours as needed for wheezing or shortness of breath. 1 Inhaler 2  . albuterol (PROVENTIL) (2.5 MG/3ML) 0.083% nebulizer solution Take 3 mLs (2.5 mg total) by nebulization every 6 (six) hours as needed for wheezing or shortness of breath. 120 vial 5  . allopurinol (ZYLOPRIM) 100 MG tablet Take 1 tablet (100 mg total) by mouth daily. 30 tablet 1  . apixaban (ELIQUIS) 5 MG TABS tablet Take 1 tablet (5 mg total) by mouth 2 (two) times daily. 60 tablet 4  . clopidogrel (PLAVIX) 75 MG tablet Take 1 tablet (75 mg total) daily by mouth. 30 tablet 11  . colchicine 0.6 MG tablet Take 1 tablet (0.6 mg total) by mouth 2 (two) times daily. 60 tablet 2  . fluticasone (FLONASE) 50 MCG/ACT nasal spray SHAKE LIQUID AND USE 2 SPRAYS IN EACH NOSTRIL DAILY (Patient taking differently: SHAKE LIQUID AND USE 2 SPRAYS IN EACH NOSTRIL DAILY AS NEEDED FOR CONGESTION) 48 g 1  . Fluticasone-Umeclidin-Vilant (TRELEGY ELLIPTA) 100-62.5-25 MCG/INH AEPB Inhale 1 puff into the lungs daily. 1 each 5  . ipratropium-albuterol (DUONEB) 0.5-2.5 (3) MG/3ML SOLN Take 3 mLs by nebulization  every 6 (six) hours as needed. (Patient taking differently: Take 3 mLs by nebulization every 6 (six) hours as needed (shortness of breath/wheezing). ) 360 mL 2  . metoprolol succinate (TOPROL-XL) 50 MG 24 hr tablet Take 1 tablet (50 mg total) by mouth daily. 90 tablet 0  . nitroGLYCERIN (NITROSTAT) 0.4 MG SL tablet Place 1 tablet (0.4 mg total) under the tongue every 5 (five) minutes x 3 doses as needed for chest pain. 25 tablet 12  . OXYGEN Inhale 2-3 L into the lungs continuous.    . pantoprazole (PROTONIX) 40 MG tablet TAKE 1 TABLET BY MOUTH DAILY (Patient taking differently: TAKE 1 TABLET (40 MG) BY MOUTH DAILY) 90 tablet 3  . potassium chloride SA (K-DUR,KLOR-CON) 20 MEQ tablet Take 1 tablet (20 mEq total) by mouth daily. 30 tablet 4  . roflumilast (DALIRESP) 500 MCG TABS tablet Take 1  tablet (500 mcg total) by mouth daily. 30 tablet 11  . rosuvastatin (CRESTOR) 20 MG tablet Take 1 tablet (20 mg total) by mouth at bedtime. 90 tablet 3  . torsemide (DEMADEX) 20 MG tablet Take 3 tablets (60 mg total) by mouth daily. OK to take an extra tablet daily as needed for weight gain or edema. (Patient taking differently: Take 20-60 mg by mouth See admin instructions. Take 3 tablets (60 mg) by mouth every morning and 1 tablet (20 mg) at night) 190 tablet 6  . traZODone (DESYREL) 50 MG tablet Take 1 tablet (50 mg total) by mouth at bedtime. 90 tablet 3   No current facility-administered medications for this visit.     Allergies:   Patient has no known allergies.    Social History:  The patient  reports that she quit smoking about 17 months ago. Her smoking use included cigarettes. She has a 5.40 pack-year smoking history. She has never used smokeless tobacco. She reports that she drinks about 8.4 oz of alcohol per week. She reports that she does not use drugs.   Family History:  The patient's family history includes Cervical cancer in her sister; Heart disease in her mother; Prostate cancer in her  brother.    ROS:  Please see the history of present illness. All other systems are reviewed and negative.    PHYSICAL EXAM: VS:  BP 116/68   Pulse 94   Ht _0  (1.575 m)   Wt 205 lb (93 kg)   LMP 12/30/1994   SpO2 (!) 84%   BMI 37.49 kg/m  , BMI Body mass index is 37.49 kg/m. GEN: Well nourished, well developed, female in no acute distress  HEENT: normal for age  Neck: JVD 8-9 cm, no carotid bruit, no masses Cardiac: RRR; no murmur, no rubs, or gallops Respiratory: rales bases bilaterally, normal work of breathing GI: soft, nontender, nondistended, + BS MS: no deformity or atrophy; trace edema; distal pulses are 2+ in all 4 extremities   Skin: warm and dry, no rash; darkening of skin over R foot noted.  Neuro:  Strength and sensation are intact Psych: euthymic mood, full affect   EKG:  EKG is not ordered today.  ECHO: 02/12/2018 - Left ventricle: The cavity size was normal. There was moderate   concentric hypertrophy. Systolic function was mildly reduced. The   estimated ejection fraction was 45%. Doppler parameters are   consistent with abnormal left ventricular relaxation (grade 1   diastolic dysfunction). - Ventricular septum: Septal motion showed abnormal function and   dyssynergy. The contour showed mild diastolic flattening and   systolic flattening. - Left atrium: The atrium was mildly dilated. - Right ventricle: The cavity size was severely dilated. Wall   thickness was increased. Systolic function was moderately   reduced. - Right atrium: The atrium was mildly dilated. - Pulmonary arteries: PA peak pressure: 47 mm Hg (S). - Pericardium, extracardiac: There was a left pleural effusion. - Impressions: Both the LV and RV are thick. LVEF 45%. RV is   markedly dilated and hypokinetic with septal flattening   suggesting elevated R-sided pressures. With constellation of   findings would consider evalaution for possible cardiac   amyloidosis. With previous h/o CABG  cannot exclude a component of   pericardial constriction though lack of IVC dilation makes this   unlikely.  Impressions:  - Both the LV and RV are thick. LVEF 45%. RV is markedly dilated   and hypokinetic with septal flattening  suggesting elevated   R-sided pressures. With constellation of findings would consider   evalaution for possible cardiac amyloidosis. With previous h/o   CABG cannot exclude a component of pericardial constriction   though lack of IVC dilation makes this unlikely. Marland Kitchen   RIGHT HEART CATH: 02/15/2018 Findings:  RA = 10 RV = 76/10 PA = 71/33 (45) PCW = 5 Fick cardiac output/index = 5.0/2.5 Thermo CO/CI = 5.6/2.8 PVR = 7.2 WU FA sat = 90% PA sat = 59%, 66%  Assessment: 1. Moderate pulmonary HTN with normal wedge pressure after diuresis 2. Markedly elevated PVR  Plan/Discussion:  Will start sildenafil 20 TID. Watch oxygenation closely.    PCI: 09/22/2017  Ost 3rd Mrg-2 lesion is 85% stenosed.  Post intervention, there is a 30% residual stenosis.  Mid Graft to Dist Graft lesion before Ost 3rd Mrg is 80% stenosed.  A drug-eluting stent was successfully placed.  Using a BALLOON EUPHORA RX 2.0X12.  Post intervention, there is a 0% residual stenosis.  A stent was successfully placed. 1. Successful POBA of anastomotic lesion in OM distal to SVG insertion 2. Successful stenting of the body of SVG to OM with DES Plan: DAPT for one month then discontinue ASA. May resume Eliquis tomorrow if no bleeding complication.  Cardiac Cath 09/20/2017 Conclusion    Prox Cx to Mid Cx lesion is 35% stenosed.  Prox RCA to Mid RCA lesion is 100% stenosed.  SVG-RCA: Prox Graft lesion is 50% stenosed.  Prox LAD lesion is 50% stenosed. Prox LAD to Mid LAD lesion is 75% stenosed at 2nd Diag. There is competitive flow from the LIMA graft distally.  Ost 1st Diag lesion is 70% stenosed. mid 1st Diag 70%  LIMA-dLAD was very difficult to engage via  femoral access. With nonselective angiography of the graft is widely patent to the very distal LAD. Competitive flow noted back to the second diagonal branch.  Ost 3rd Mrg-1 lesion is 100% stenosed. -Flush occlusion from the native circumflex  Ost 3rd Mrg-2 lesion is 85% stenosed at the anastomosis site of the SVG  Mid Cx to Dist Cx lesion is 80% stenosed.  Dist Cx lesion is 75% stenosed. Just prior to insertion of second limb of SVG-OM3-dCx  SVG-OM3-dCx mid Graft to Dist Graft lesion before Ost 3rd Mrg is 80% stenosed -prior to insertion on 3rd Mrg  Hemodynamic findings consistent with moderate pulmonary hypertension. With moderately elevated LVEDP and PCWP.  Mild reduction in cardiac output by Fick despite normal EF by Echocardiogram  Patient has severe native coronary disease, relatively stable from previous catheterization.  The RCA is now 100% occluded, the LAD distally has competitive flow from the LIMA. Both the vein grafts have tubular narrowings in the more proximal segment of the RCA graft is only 50% whereas the sequential OM-dCx graft has a more significant 80% stenosis. There is a severe anastomotic lesion of the vein graft to OM 3.  Moderate to severe pulmonary hypertension with only mild to moderately elevated filling pressures. Likely primary pulmonary potential component.    Recent Labs: 08/06/2017: Pro B Natriuretic peptide (BNP) 2,160.0 02/11/2018: ALT 20; B Natriuretic Peptide 575.1 02/12/2018: Hemoglobin 14.3; Platelets 230 02/13/2018: Magnesium 2.0 02/16/2018: BUN 16; Creatinine, Ser 0.94; Potassium 3.8; Sodium 137    Lipid Panel    Component Value Date/Time   CHOL 145 03/09/2017 1148   CHOL 236 (H) 08/19/2015 0941   TRIG 104 03/09/2017 1148   HDL 52 03/09/2017 1148   HDL 49 08/19/2015 0941  CHOLHDL 2.8 03/09/2017 1148   VLDL 21 03/09/2017 1148   LDLCALC 72 03/09/2017 1148   LDLCALC 159 (H) 08/19/2015 0941   LDLDIRECT 145.2 10/29/2011 1618     Wt  Readings from Last 3 Encounters:  04/21/18 205 lb (93 kg)  03/30/18 206 lb (93.4 kg)  03/08/18 209 lb 12.8 oz (95.2 kg)     Other studies Reviewed: Additional studies/ records that were reviewed today include: office notes, hospital records and testing.  ASSESSMENT AND PLAN:  1.  PAH: She is requested to try restarting the sildenafil at half tablet 3 times daily, 10 mg.  Keep the follow-up appointment with the CHF clinic next week.  2.  Chronic diastolic CHF: Her weight is stable, she is encouraged to weigh daily.  Continue current torsemide dose, check a BMET today.  We will leave management to the CHF clinic  3. CAD: Continue Plavix, beta-blocker, statin, nitro.  No testing at this time.  4.  Hyperlipidemia: She is to continue the statin.  Her last labs were 03/09/2017, LDL was 72.  Recheck with fasting labs.  Current medicines are reviewed at length with the patient today.  The patient does not have concerns regarding medicines.  The following changes have been made: Restart sildenafil  Labs/ tests ordered today include:   Orders Placed This Encounter  Procedures  . Basic metabolic panel    Disposition:   FU with Dr Sallyanne Kuster  Signed, Rosaria Ferries, PA-C  04/21/2018 11:31 AM    [place] Phone: [phone]; Fax: [phone]  This note was written with the assistance of speech recognition software. Please excuse any transcriptional errors.

## 2018-04-21 NOTE — Progress Notes (Signed)
I did not think nausea was a common side effect of sildenafil, but we will see.  MCr

## 2018-04-25 ENCOUNTER — Other Ambulatory Visit: Payer: Self-pay

## 2018-04-25 MED ORDER — POTASSIUM CHLORIDE CRYS ER 20 MEQ PO TBCR: EXTENDED_RELEASE_TABLET | ORAL | 4 refills | Status: AC

## 2018-04-26 ENCOUNTER — Encounter: Payer: Self-pay | Admitting: Internal Medicine

## 2018-04-26 ENCOUNTER — Encounter: Payer: Medicare Other | Admitting: Internal Medicine

## 2018-04-26 NOTE — Progress Notes (Deleted)
   CC: ***  HPI:  Ms.Mineola P Hearld is a 62 y.o. with PMH  advanced pulmonary hypertension, COPD, OSA, chronic respiratory failure on 2 L supplemental oxygen, CAD (s/p CABG x4 in 2017), paroxysmal atrial fibrillation (on eliquis), chronic diastolic heart failure, HTN, history of +ANA, GERD, HLD, depression, pulmonary nodules and CVA who presents for ***. Please see the assessment and plans for the status of the patient chronic medical problems.    Past Medical History:  Diagnosis Date  . CAD (coronary artery disease)    NSTEMI 08/2011:  LHC 08/21/11: mLAD 60-70%, pCFX occluded, dRCA chronic occlusion with L-R collats, EF 40-45%, inf AK.  PCI:  BMS to CFX.  Marland Kitchen CHF (congestive heart failure) ([place]) 05/31/2017  . COPD (chronic obstructive pulmonary disease) ([place]) 11/17/2016  . Depression   . Fibroids   . GERD (gastroesophageal reflux disease)   . Gout   . HLD (hyperlipidemia)    Chol = 235, LDL = 156 (08/2010)  . Hypertension   . Lupus ([place]) 2009   ANA + 10/2008, repeat ANA + (05/2009), on that visit 05/2009 following labs obtained RF <20, CRP <0.4, ANA titers 1:80,   . On home oxygen therapy    "2L; 24/7 (05/31/2017)  . Pulmonary nodules 05/2008   noted on CXR and CT 05/2008, repeat CT 10/2008 - Stable small bilateral pulmonary nodules measuring up to 6 m m  . Stroke [facility]) 04/2014   "mini stroke"; denies residual on 05/31/2017  . Toe pain, right 10/08/2017   Review of Systems:  Refer to history of present illness and assessment and plans for pertinent review of systems, all others reviewed and negative  Physical Exam:  There were no vitals filed for this visit. ***  Assessment & Plan:   Gout  At last office visit 4/23, about 6 weeks ago she was found to have a uric acid 13.4 and was started on allopurinol 100 mg qd and asked to continue taking colchicine 0.6 mg BID. She returns for repeat check of the uric acid today  - follow up uric acid today  - if uric acid remains  elevated above 6 would increase allopurinol to 200 mg daily and rtc in 1 month for recheck of the level, if uric acid is below 6 we can continue the allopurinol 100 mg daily and stop the daily colchicine BID after 6 more weeks  GERD  - protonix 40 qd   Atherosclerosis of the aorta  Denies signs of peripheral thromboembolism resulting in CVA or or PAD.  - continue treatment of HTN and HLD and monitoring for DM    See Encounters Tab for problem based charting.  Patient {GC/GE:[id number]::"discussed with","seen with"} Dr. {NAMES:[id number]::"Butcher","Granfortuna","E. Hoffman","Klima","Mullen","Narendra","Raines","Vincent"}

## 2018-05-12 ENCOUNTER — Ambulatory Visit: Payer: Medicare Other | Admitting: Pulmonary Disease

## 2018-05-16 ENCOUNTER — Ambulatory Visit: Payer: Medicare Other | Admitting: Pulmonary Disease

## 2018-05-16 DIAGNOSIS — 419620001 Death: Secondary | SNOMED CT | POA: Diagnosis not present

## 2018-05-16 DEATH — deceased
